# Patient Record
Sex: Female | Born: 1945 | Race: Black or African American | Hispanic: No | Smoking: Former smoker
Health system: Southern US, Community
[De-identification: ages and names within clinical notes are randomized; demographics above are authoritative.]

## PROBLEM LIST (undated history)

## (undated) DIAGNOSIS — B029 Zoster without complications: Secondary | ICD-10-CM

## (undated) DIAGNOSIS — T7840XA Allergy, unspecified, initial encounter: Secondary | ICD-10-CM

## (undated) DIAGNOSIS — H269 Unspecified cataract: Secondary | ICD-10-CM

## (undated) DIAGNOSIS — E1100 Type 2 diabetes mellitus with hyperosmolarity without nonketotic hyperglycemic-hyperosmolar coma (NKHHC): Secondary | ICD-10-CM

## (undated) DIAGNOSIS — R002 Palpitations: Secondary | ICD-10-CM

## (undated) DIAGNOSIS — E1140 Type 2 diabetes mellitus with diabetic neuropathy, unspecified: Secondary | ICD-10-CM

## (undated) DIAGNOSIS — G459 Transient cerebral ischemic attack, unspecified: Secondary | ICD-10-CM

## (undated) DIAGNOSIS — D126 Benign neoplasm of colon, unspecified: Secondary | ICD-10-CM

## (undated) DIAGNOSIS — C50919 Malignant neoplasm of unspecified site of unspecified female breast: Secondary | ICD-10-CM

## (undated) DIAGNOSIS — R0781 Pleurodynia: Secondary | ICD-10-CM

## (undated) DIAGNOSIS — E119 Type 2 diabetes mellitus without complications: Secondary | ICD-10-CM

## (undated) DIAGNOSIS — M1990 Unspecified osteoarthritis, unspecified site: Secondary | ICD-10-CM

## (undated) DIAGNOSIS — E7800 Pure hypercholesterolemia, unspecified: Secondary | ICD-10-CM

## (undated) DIAGNOSIS — N289 Disorder of kidney and ureter, unspecified: Secondary | ICD-10-CM

## (undated) DIAGNOSIS — R21 Rash and other nonspecific skin eruption: Secondary | ICD-10-CM

## (undated) DIAGNOSIS — I10 Essential (primary) hypertension: Secondary | ICD-10-CM

## (undated) DIAGNOSIS — G4733 Obstructive sleep apnea (adult) (pediatric): Secondary | ICD-10-CM

## (undated) DIAGNOSIS — E1143 Type 2 diabetes mellitus with diabetic autonomic (poly)neuropathy: Secondary | ICD-10-CM

## (undated) DIAGNOSIS — D3A00 Benign carcinoid tumor of unspecified site: Secondary | ICD-10-CM

## (undated) DIAGNOSIS — D649 Anemia, unspecified: Secondary | ICD-10-CM

## (undated) DIAGNOSIS — M79643 Pain in unspecified hand: Secondary | ICD-10-CM

## (undated) DIAGNOSIS — J40 Bronchitis, not specified as acute or chronic: Secondary | ICD-10-CM

## (undated) DIAGNOSIS — Z950 Presence of cardiac pacemaker: Secondary | ICD-10-CM

## (undated) DIAGNOSIS — E11359 Type 2 diabetes mellitus with proliferative diabetic retinopathy without macular edema: Secondary | ICD-10-CM

## (undated) DIAGNOSIS — J45909 Unspecified asthma, uncomplicated: Secondary | ICD-10-CM

## (undated) DIAGNOSIS — R0609 Other forms of dyspnea: Secondary | ICD-10-CM

## (undated) DIAGNOSIS — H4310 Vitreous hemorrhage, unspecified eye: Secondary | ICD-10-CM

## (undated) DIAGNOSIS — R0600 Dyspnea, unspecified: Secondary | ICD-10-CM

## (undated) DIAGNOSIS — I442 Atrioventricular block, complete: Secondary | ICD-10-CM

## (undated) DIAGNOSIS — I509 Heart failure, unspecified: Secondary | ICD-10-CM

## (undated) DIAGNOSIS — J449 Chronic obstructive pulmonary disease, unspecified: Secondary | ICD-10-CM

## (undated) HISTORY — DX: Chronic obstructive pulmonary disease, unspecified: J44.9

## (undated) HISTORY — DX: Transient cerebral ischemic attack, unspecified: G45.9

## (undated) HISTORY — PX: MASTECTOMY PARTIAL / LUMPECTOMY: SUR851

## (undated) HISTORY — PX: PACEMAKER INSERTION: SHX728

## (undated) HISTORY — DX: Type 2 diabetes mellitus with proliferative diabetic retinopathy without macular edema: E11.359

## (undated) HISTORY — DX: Pain in unspecified hand: M79.643

## (undated) HISTORY — DX: Heart failure, unspecified: I50.9

## (undated) HISTORY — DX: Atrioventricular block, complete: I44.2

## (undated) HISTORY — DX: Benign carcinoid tumor of unspecified site: D3A.00

## (undated) HISTORY — DX: Pleurodynia: R07.81

## (undated) HISTORY — DX: Anemia, unspecified: D64.9

## (undated) HISTORY — DX: Vitreous hemorrhage, unspecified eye: H43.10

## (undated) HISTORY — DX: Malignant neoplasm of unspecified site of unspecified female breast: C50.919

## (undated) HISTORY — DX: Other forms of dyspnea: R06.09

## (undated) HISTORY — DX: Type 2 diabetes mellitus with diabetic autonomic (poly)neuropathy: E11.43

## (undated) HISTORY — DX: Zoster without complications: B02.9

## (undated) HISTORY — DX: Benign neoplasm of colon, unspecified: D12.6

## (undated) HISTORY — DX: Type 2 diabetes mellitus without complications: E11.9

## (undated) HISTORY — DX: Palpitations: R00.2

## (undated) HISTORY — PX: MASTECTOMY PARTIAL / LUMPECTOMY W/ AXILLARY LYMPHADENECTOMY: SUR852

## (undated) HISTORY — DX: Dyspnea, unspecified: R06.00

## (undated) HISTORY — DX: Essential (primary) hypertension: I10

## (undated) HISTORY — DX: Unspecified osteoarthritis, unspecified site: M19.90

## (undated) HISTORY — DX: Rash and other nonspecific skin eruption: R21

## (undated) HISTORY — PX: LOBECTOMY: SHX5089

## (undated) HISTORY — DX: Obstructive sleep apnea (adult) (pediatric): G47.33

## (undated) HISTORY — PX: CHOLECYSTECTOMY: SHX55

## (undated) HISTORY — PX: OTHER SURGICAL HISTORY: SHX169

## (undated) HISTORY — DX: Pure hypercholesterolemia, unspecified: E78.00

## (undated) HISTORY — DX: Presence of cardiac pacemaker: Z95.0

## (undated) HISTORY — PX: TONSILLECTOMY: SUR1361

---

## 1997-08-06 ENCOUNTER — Encounter: Admission: RE | Admit: 1997-08-06 | Discharge: 1997-08-06 | Payer: Self-pay | Admitting: Internal Medicine

## 1997-10-04 ENCOUNTER — Encounter: Admission: RE | Admit: 1997-10-04 | Discharge: 1997-10-04 | Payer: Self-pay | Admitting: Hematology and Oncology

## 1998-01-01 ENCOUNTER — Encounter: Admission: RE | Admit: 1998-01-01 | Discharge: 1998-01-01 | Payer: Self-pay | Admitting: Hematology and Oncology

## 1998-01-17 ENCOUNTER — Encounter: Admission: RE | Admit: 1998-01-17 | Discharge: 1998-01-17 | Payer: Self-pay | Admitting: Internal Medicine

## 1998-01-20 ENCOUNTER — Encounter: Admission: RE | Admit: 1998-01-20 | Discharge: 1998-01-20 | Payer: Self-pay | Admitting: Internal Medicine

## 1998-03-19 ENCOUNTER — Encounter: Admission: RE | Admit: 1998-03-19 | Discharge: 1998-03-19 | Payer: Self-pay | Admitting: Internal Medicine

## 1998-05-16 ENCOUNTER — Encounter: Admission: RE | Admit: 1998-05-16 | Discharge: 1998-05-16 | Payer: Self-pay | Admitting: Internal Medicine

## 1998-05-16 ENCOUNTER — Ambulatory Visit (HOSPITAL_COMMUNITY): Admission: RE | Admit: 1998-05-16 | Discharge: 1998-05-16 | Payer: Self-pay | Admitting: Internal Medicine

## 1998-05-21 ENCOUNTER — Encounter: Admission: RE | Admit: 1998-05-21 | Discharge: 1998-05-21 | Payer: Self-pay | Admitting: Internal Medicine

## 1998-05-29 ENCOUNTER — Ambulatory Visit (HOSPITAL_COMMUNITY): Admission: RE | Admit: 1998-05-29 | Discharge: 1998-05-29 | Payer: Self-pay | Admitting: *Deleted

## 1998-06-04 ENCOUNTER — Encounter: Admission: RE | Admit: 1998-06-04 | Discharge: 1998-06-04 | Payer: Self-pay | Admitting: Hematology and Oncology

## 1998-08-18 ENCOUNTER — Ambulatory Visit (HOSPITAL_COMMUNITY): Admission: RE | Admit: 1998-08-18 | Discharge: 1998-08-18 | Payer: Self-pay | Admitting: Internal Medicine

## 1998-08-18 ENCOUNTER — Encounter: Admission: RE | Admit: 1998-08-18 | Discharge: 1998-08-18 | Payer: Self-pay | Admitting: Psychiatry

## 1998-08-19 ENCOUNTER — Encounter: Admission: RE | Admit: 1998-08-19 | Discharge: 1998-08-19 | Payer: Self-pay | Admitting: Internal Medicine

## 1998-09-15 ENCOUNTER — Encounter: Admission: RE | Admit: 1998-09-15 | Discharge: 1998-09-15 | Payer: Self-pay | Admitting: Internal Medicine

## 1998-11-24 ENCOUNTER — Encounter: Admission: RE | Admit: 1998-11-24 | Discharge: 1998-11-24 | Payer: Self-pay | Admitting: Internal Medicine

## 1998-11-25 ENCOUNTER — Encounter: Admission: RE | Admit: 1998-11-25 | Discharge: 1998-11-25 | Payer: Self-pay | Admitting: Hematology and Oncology

## 1998-12-09 ENCOUNTER — Encounter: Admission: RE | Admit: 1998-12-09 | Discharge: 1998-12-09 | Payer: Self-pay | Admitting: Hematology and Oncology

## 1998-12-16 ENCOUNTER — Encounter: Admission: RE | Admit: 1998-12-16 | Discharge: 1998-12-16 | Payer: Self-pay | Admitting: Hematology and Oncology

## 1998-12-30 ENCOUNTER — Encounter: Admission: RE | Admit: 1998-12-30 | Discharge: 1998-12-30 | Payer: Self-pay | Admitting: Hematology and Oncology

## 1999-02-04 ENCOUNTER — Encounter: Admission: RE | Admit: 1999-02-04 | Discharge: 1999-02-04 | Payer: Self-pay | Admitting: Internal Medicine

## 1999-03-25 ENCOUNTER — Encounter: Admission: RE | Admit: 1999-03-25 | Discharge: 1999-03-25 | Payer: Self-pay | Admitting: Internal Medicine

## 1999-05-27 ENCOUNTER — Encounter: Admission: RE | Admit: 1999-05-27 | Discharge: 1999-05-27 | Payer: Self-pay | Admitting: Internal Medicine

## 1999-06-11 ENCOUNTER — Encounter: Payer: Self-pay | Admitting: Gastroenterology

## 1999-06-11 ENCOUNTER — Encounter: Admission: RE | Admit: 1999-06-11 | Discharge: 1999-06-11 | Payer: Self-pay | Admitting: Gastroenterology

## 1999-07-02 ENCOUNTER — Encounter: Admission: RE | Admit: 1999-07-02 | Discharge: 1999-07-02 | Payer: Self-pay | Admitting: Internal Medicine

## 1999-07-02 ENCOUNTER — Ambulatory Visit (HOSPITAL_COMMUNITY): Admission: RE | Admit: 1999-07-02 | Discharge: 1999-07-02 | Payer: Self-pay | Admitting: Internal Medicine

## 1999-07-02 ENCOUNTER — Encounter: Payer: Self-pay | Admitting: Internal Medicine

## 1999-07-06 ENCOUNTER — Encounter (INDEPENDENT_AMBULATORY_CARE_PROVIDER_SITE_OTHER): Payer: Self-pay

## 1999-07-06 ENCOUNTER — Ambulatory Visit (HOSPITAL_COMMUNITY): Admission: RE | Admit: 1999-07-06 | Discharge: 1999-07-06 | Payer: Self-pay | Admitting: Gastroenterology

## 1999-07-06 DIAGNOSIS — Z860100 Personal history of colon polyps, unspecified: Secondary | ICD-10-CM | POA: Insufficient documentation

## 1999-08-28 ENCOUNTER — Encounter: Admission: RE | Admit: 1999-08-28 | Discharge: 1999-08-28 | Payer: Self-pay | Admitting: Internal Medicine

## 1999-09-03 ENCOUNTER — Encounter: Admission: RE | Admit: 1999-09-03 | Discharge: 1999-09-03 | Payer: Self-pay | Admitting: Hematology and Oncology

## 1999-10-13 ENCOUNTER — Other Ambulatory Visit: Admission: RE | Admit: 1999-10-13 | Discharge: 1999-10-13 | Payer: Self-pay | Admitting: Obstetrics

## 1999-10-13 ENCOUNTER — Encounter: Admission: RE | Admit: 1999-10-13 | Discharge: 1999-10-13 | Payer: Self-pay | Admitting: Obstetrics

## 1999-10-29 ENCOUNTER — Encounter: Admission: RE | Admit: 1999-10-29 | Discharge: 1999-10-29 | Payer: Self-pay | Admitting: Obstetrics

## 1999-11-25 ENCOUNTER — Encounter: Admission: RE | Admit: 1999-11-25 | Discharge: 1999-11-25 | Payer: Self-pay | Admitting: Internal Medicine

## 2000-01-06 ENCOUNTER — Encounter: Admission: RE | Admit: 2000-01-06 | Discharge: 2000-01-06 | Payer: Self-pay | Admitting: Internal Medicine

## 2000-02-10 ENCOUNTER — Encounter: Payer: Self-pay | Admitting: Oncology

## 2000-02-10 ENCOUNTER — Encounter: Admission: RE | Admit: 2000-02-10 | Discharge: 2000-02-10 | Payer: Self-pay | Admitting: Oncology

## 2000-02-11 ENCOUNTER — Encounter (HOSPITAL_COMMUNITY): Admission: RE | Admit: 2000-02-11 | Discharge: 2000-05-11 | Payer: Self-pay | Admitting: Dentistry

## 2000-02-11 ENCOUNTER — Encounter (HOSPITAL_COMMUNITY): Payer: Self-pay | Admitting: Dentistry

## 2000-02-23 ENCOUNTER — Ambulatory Visit (HOSPITAL_COMMUNITY): Admission: RE | Admit: 2000-02-23 | Discharge: 2000-02-23 | Payer: Self-pay | Admitting: Internal Medicine

## 2000-02-23 ENCOUNTER — Encounter: Admission: RE | Admit: 2000-02-23 | Discharge: 2000-02-23 | Payer: Self-pay | Admitting: Internal Medicine

## 2000-02-23 ENCOUNTER — Encounter: Payer: Self-pay | Admitting: Internal Medicine

## 2000-03-01 ENCOUNTER — Encounter: Payer: Self-pay | Admitting: Gastroenterology

## 2000-03-01 ENCOUNTER — Encounter: Admission: RE | Admit: 2000-03-01 | Discharge: 2000-03-01 | Payer: Self-pay | Admitting: Gastroenterology

## 2000-03-08 ENCOUNTER — Encounter: Admission: RE | Admit: 2000-03-08 | Discharge: 2000-03-08 | Payer: Self-pay | Admitting: Internal Medicine

## 2000-03-15 ENCOUNTER — Encounter: Admission: RE | Admit: 2000-03-15 | Discharge: 2000-03-15 | Payer: Self-pay | Admitting: Hematology and Oncology

## 2000-03-28 ENCOUNTER — Encounter: Payer: Self-pay | Admitting: Internal Medicine

## 2000-03-28 ENCOUNTER — Ambulatory Visit (HOSPITAL_COMMUNITY): Admission: RE | Admit: 2000-03-28 | Discharge: 2000-03-28 | Payer: Self-pay | Admitting: Internal Medicine

## 2000-04-06 ENCOUNTER — Encounter: Admission: RE | Admit: 2000-04-06 | Discharge: 2000-04-06 | Payer: Self-pay | Admitting: Internal Medicine

## 2000-05-04 ENCOUNTER — Other Ambulatory Visit: Admission: RE | Admit: 2000-05-04 | Discharge: 2000-05-04 | Payer: Self-pay | Admitting: Radiology

## 2000-05-11 ENCOUNTER — Other Ambulatory Visit: Admission: RE | Admit: 2000-05-11 | Discharge: 2000-05-11 | Payer: Self-pay | Admitting: Radiology

## 2000-05-13 ENCOUNTER — Encounter: Admission: RE | Admit: 2000-05-13 | Discharge: 2000-05-13 | Payer: Self-pay | Admitting: Internal Medicine

## 2000-05-16 ENCOUNTER — Encounter: Admission: RE | Admit: 2000-05-16 | Discharge: 2000-05-16 | Payer: Self-pay | Admitting: Internal Medicine

## 2000-05-22 ENCOUNTER — Encounter: Payer: Self-pay | Admitting: Emergency Medicine

## 2000-05-22 ENCOUNTER — Emergency Department (HOSPITAL_COMMUNITY): Admission: EM | Admit: 2000-05-22 | Discharge: 2000-05-22 | Payer: Self-pay | Admitting: Emergency Medicine

## 2000-05-24 ENCOUNTER — Encounter: Admission: RE | Admit: 2000-05-24 | Discharge: 2000-05-24 | Payer: Self-pay | Admitting: Obstetrics & Gynecology

## 2000-05-27 ENCOUNTER — Encounter: Admission: RE | Admit: 2000-05-27 | Discharge: 2000-05-27 | Payer: Self-pay | Admitting: Internal Medicine

## 2000-06-10 ENCOUNTER — Encounter: Admission: RE | Admit: 2000-06-10 | Discharge: 2000-06-10 | Payer: Self-pay | Admitting: Internal Medicine

## 2000-06-30 ENCOUNTER — Encounter: Admission: RE | Admit: 2000-06-30 | Discharge: 2000-06-30 | Payer: Self-pay | Admitting: Internal Medicine

## 2000-08-24 ENCOUNTER — Encounter: Admission: RE | Admit: 2000-08-24 | Discharge: 2000-08-24 | Payer: Self-pay | Admitting: Internal Medicine

## 2000-09-06 ENCOUNTER — Encounter: Payer: Self-pay | Admitting: Oncology

## 2000-09-06 ENCOUNTER — Ambulatory Visit (HOSPITAL_COMMUNITY): Admission: RE | Admit: 2000-09-06 | Discharge: 2000-09-06 | Payer: Self-pay | Admitting: Oncology

## 2000-09-21 ENCOUNTER — Ambulatory Visit (HOSPITAL_COMMUNITY): Admission: RE | Admit: 2000-09-21 | Discharge: 2000-09-21 | Payer: Self-pay | Admitting: Internal Medicine

## 2000-09-21 ENCOUNTER — Encounter: Admission: RE | Admit: 2000-09-21 | Discharge: 2000-09-21 | Payer: Self-pay | Admitting: Internal Medicine

## 2000-10-19 ENCOUNTER — Encounter: Admission: RE | Admit: 2000-10-19 | Discharge: 2000-10-19 | Payer: Self-pay | Admitting: Internal Medicine

## 2000-11-08 ENCOUNTER — Encounter: Admission: RE | Admit: 2000-11-08 | Discharge: 2000-11-08 | Payer: Self-pay | Admitting: Internal Medicine

## 2000-12-21 ENCOUNTER — Encounter: Payer: Self-pay | Admitting: Internal Medicine

## 2000-12-21 ENCOUNTER — Encounter: Admission: RE | Admit: 2000-12-21 | Discharge: 2000-12-21 | Payer: Self-pay | Admitting: Internal Medicine

## 2000-12-21 ENCOUNTER — Ambulatory Visit (HOSPITAL_COMMUNITY): Admission: RE | Admit: 2000-12-21 | Discharge: 2000-12-21 | Payer: Self-pay | Admitting: Internal Medicine

## 2000-12-22 ENCOUNTER — Encounter: Admission: RE | Admit: 2000-12-22 | Discharge: 2000-12-22 | Payer: Self-pay | Admitting: Internal Medicine

## 2001-02-01 ENCOUNTER — Encounter: Admission: RE | Admit: 2001-02-01 | Discharge: 2001-02-01 | Payer: Self-pay | Admitting: Internal Medicine

## 2001-03-01 ENCOUNTER — Encounter: Admission: RE | Admit: 2001-03-01 | Discharge: 2001-03-01 | Payer: Self-pay | Admitting: Internal Medicine

## 2001-03-08 ENCOUNTER — Ambulatory Visit (HOSPITAL_COMMUNITY): Admission: RE | Admit: 2001-03-08 | Discharge: 2001-03-08 | Payer: Self-pay | Admitting: Oncology

## 2001-03-08 ENCOUNTER — Encounter: Payer: Self-pay | Admitting: Oncology

## 2001-04-20 ENCOUNTER — Encounter: Admission: RE | Admit: 2001-04-20 | Discharge: 2001-04-20 | Payer: Self-pay

## 2001-04-20 ENCOUNTER — Inpatient Hospital Stay (HOSPITAL_COMMUNITY): Admission: RE | Admit: 2001-04-20 | Discharge: 2001-04-25 | Payer: Self-pay

## 2001-04-20 ENCOUNTER — Encounter: Payer: Self-pay | Admitting: Internal Medicine

## 2001-04-20 HISTORY — PX: PACEMAKER INSERTION: SHX728

## 2001-04-21 ENCOUNTER — Encounter: Payer: Self-pay | Admitting: Internal Medicine

## 2001-04-23 ENCOUNTER — Encounter: Payer: Self-pay | Admitting: Cardiology

## 2001-04-25 ENCOUNTER — Encounter: Payer: Self-pay | Admitting: Cardiology

## 2001-05-03 ENCOUNTER — Encounter: Admission: RE | Admit: 2001-05-03 | Discharge: 2001-05-03 | Payer: Self-pay | Admitting: Internal Medicine

## 2001-05-17 ENCOUNTER — Ambulatory Visit (HOSPITAL_BASED_OUTPATIENT_CLINIC_OR_DEPARTMENT_OTHER): Admission: RE | Admit: 2001-05-17 | Discharge: 2001-05-17 | Payer: Self-pay | Admitting: Internal Medicine

## 2001-05-17 ENCOUNTER — Encounter: Admission: RE | Admit: 2001-05-17 | Discharge: 2001-05-17 | Payer: Self-pay | Admitting: Internal Medicine

## 2001-05-29 ENCOUNTER — Ambulatory Visit (HOSPITAL_COMMUNITY): Admission: RE | Admit: 2001-05-29 | Discharge: 2001-05-29 | Payer: Self-pay | Admitting: Oncology

## 2001-05-29 ENCOUNTER — Encounter: Payer: Self-pay | Admitting: Oncology

## 2001-06-09 ENCOUNTER — Encounter: Payer: Self-pay | Admitting: Thoracic Surgery

## 2001-06-13 ENCOUNTER — Encounter: Payer: Self-pay | Admitting: Thoracic Surgery

## 2001-06-13 ENCOUNTER — Inpatient Hospital Stay (HOSPITAL_COMMUNITY): Admission: RE | Admit: 2001-06-13 | Discharge: 2001-06-20 | Payer: Self-pay | Admitting: Thoracic Surgery

## 2001-06-13 ENCOUNTER — Encounter (INDEPENDENT_AMBULATORY_CARE_PROVIDER_SITE_OTHER): Payer: Self-pay | Admitting: Specialist

## 2001-06-13 HISTORY — PX: LUNG LOBECTOMY: SHX167

## 2001-06-14 ENCOUNTER — Encounter: Payer: Self-pay | Admitting: Thoracic Surgery

## 2001-06-15 ENCOUNTER — Encounter: Payer: Self-pay | Admitting: Thoracic Surgery

## 2001-06-16 ENCOUNTER — Encounter: Payer: Self-pay | Admitting: Thoracic Surgery

## 2001-06-17 ENCOUNTER — Encounter: Payer: Self-pay | Admitting: Thoracic Surgery

## 2001-06-19 ENCOUNTER — Encounter: Payer: Self-pay | Admitting: Thoracic Surgery

## 2001-06-27 ENCOUNTER — Encounter: Admission: RE | Admit: 2001-06-27 | Discharge: 2001-06-27 | Payer: Self-pay | Admitting: Thoracic Surgery

## 2001-06-27 ENCOUNTER — Encounter: Payer: Self-pay | Admitting: Thoracic Surgery

## 2001-06-30 ENCOUNTER — Encounter: Admission: RE | Admit: 2001-06-30 | Discharge: 2001-06-30 | Payer: Self-pay | Admitting: Internal Medicine

## 2001-06-30 ENCOUNTER — Inpatient Hospital Stay (HOSPITAL_COMMUNITY): Admission: AD | Admit: 2001-06-30 | Discharge: 2001-07-03 | Payer: Self-pay | Admitting: Internal Medicine

## 2001-07-01 ENCOUNTER — Encounter: Payer: Self-pay | Admitting: Internal Medicine

## 2001-07-10 ENCOUNTER — Encounter: Admission: RE | Admit: 2001-07-10 | Discharge: 2001-07-10 | Payer: Self-pay | Admitting: Internal Medicine

## 2001-07-18 ENCOUNTER — Encounter: Admission: RE | Admit: 2001-07-18 | Discharge: 2001-07-18 | Payer: Self-pay | Admitting: Thoracic Surgery

## 2001-07-18 ENCOUNTER — Encounter: Payer: Self-pay | Admitting: Thoracic Surgery

## 2001-07-25 ENCOUNTER — Encounter: Admission: RE | Admit: 2001-07-25 | Discharge: 2001-07-25 | Payer: Self-pay | Admitting: Internal Medicine

## 2001-08-08 ENCOUNTER — Encounter: Payer: Self-pay | Admitting: Thoracic Surgery

## 2001-08-08 ENCOUNTER — Encounter: Admission: RE | Admit: 2001-08-08 | Discharge: 2001-08-08 | Payer: Self-pay | Admitting: Thoracic Surgery

## 2001-08-23 ENCOUNTER — Encounter: Admission: RE | Admit: 2001-08-23 | Discharge: 2001-08-23 | Payer: Self-pay | Admitting: Internal Medicine

## 2001-08-23 ENCOUNTER — Inpatient Hospital Stay (HOSPITAL_COMMUNITY): Admission: RE | Admit: 2001-08-23 | Discharge: 2001-08-25 | Payer: Self-pay | Admitting: Internal Medicine

## 2001-08-23 ENCOUNTER — Encounter: Payer: Self-pay | Admitting: Internal Medicine

## 2001-08-23 ENCOUNTER — Ambulatory Visit (HOSPITAL_COMMUNITY): Admission: RE | Admit: 2001-08-23 | Discharge: 2001-08-23 | Payer: Self-pay | Admitting: Internal Medicine

## 2001-08-24 ENCOUNTER — Encounter: Payer: Self-pay | Admitting: Internal Medicine

## 2001-08-28 ENCOUNTER — Inpatient Hospital Stay (HOSPITAL_COMMUNITY): Admission: EM | Admit: 2001-08-28 | Discharge: 2001-08-30 | Payer: Self-pay | Admitting: *Deleted

## 2001-08-28 ENCOUNTER — Encounter: Payer: Self-pay | Admitting: *Deleted

## 2001-08-29 ENCOUNTER — Encounter: Payer: Self-pay | Admitting: Internal Medicine

## 2001-09-13 ENCOUNTER — Encounter: Admission: RE | Admit: 2001-09-13 | Discharge: 2001-09-13 | Payer: Self-pay | Admitting: Internal Medicine

## 2001-09-19 ENCOUNTER — Ambulatory Visit (HOSPITAL_COMMUNITY): Admission: RE | Admit: 2001-09-19 | Discharge: 2001-09-19 | Payer: Self-pay | Admitting: Oncology

## 2001-09-19 ENCOUNTER — Encounter: Payer: Self-pay | Admitting: Oncology

## 2001-10-10 ENCOUNTER — Encounter: Admission: RE | Admit: 2001-10-10 | Discharge: 2001-10-10 | Payer: Self-pay | Admitting: Thoracic Surgery

## 2001-10-10 ENCOUNTER — Encounter: Payer: Self-pay | Admitting: Thoracic Surgery

## 2001-10-13 ENCOUNTER — Other Ambulatory Visit: Admission: RE | Admit: 2001-10-13 | Discharge: 2001-10-13 | Payer: Self-pay | Admitting: *Deleted

## 2001-10-13 ENCOUNTER — Encounter: Payer: Self-pay | Admitting: Internal Medicine

## 2001-10-13 ENCOUNTER — Encounter: Admission: RE | Admit: 2001-10-13 | Discharge: 2001-10-13 | Payer: Self-pay | Admitting: *Deleted

## 2001-10-13 LAB — CONVERTED CEMR LAB: Pap Smear: NORMAL

## 2001-10-24 ENCOUNTER — Encounter: Admission: RE | Admit: 2001-10-24 | Discharge: 2001-10-24 | Payer: Self-pay | Admitting: Internal Medicine

## 2001-11-15 ENCOUNTER — Encounter: Admission: RE | Admit: 2001-11-15 | Discharge: 2001-11-15 | Payer: Self-pay | Admitting: Internal Medicine

## 2001-11-27 ENCOUNTER — Ambulatory Visit (HOSPITAL_COMMUNITY): Admission: RE | Admit: 2001-11-27 | Discharge: 2001-11-27 | Payer: Self-pay | Admitting: Internal Medicine

## 2001-11-27 ENCOUNTER — Encounter: Payer: Self-pay | Admitting: Internal Medicine

## 2001-11-30 ENCOUNTER — Ambulatory Visit (HOSPITAL_COMMUNITY): Admission: RE | Admit: 2001-11-30 | Discharge: 2001-11-30 | Payer: Self-pay | Admitting: Internal Medicine

## 2001-12-06 ENCOUNTER — Ambulatory Visit (HOSPITAL_COMMUNITY): Admission: RE | Admit: 2001-12-06 | Discharge: 2001-12-06 | Payer: Self-pay | Admitting: Internal Medicine

## 2001-12-19 ENCOUNTER — Ambulatory Visit (HOSPITAL_COMMUNITY): Admission: RE | Admit: 2001-12-19 | Discharge: 2001-12-19 | Payer: Self-pay | Admitting: General Surgery

## 2001-12-19 ENCOUNTER — Encounter (HOSPITAL_BASED_OUTPATIENT_CLINIC_OR_DEPARTMENT_OTHER): Payer: Self-pay | Admitting: General Surgery

## 2002-01-24 ENCOUNTER — Encounter: Payer: Self-pay | Admitting: Emergency Medicine

## 2002-01-24 ENCOUNTER — Inpatient Hospital Stay (HOSPITAL_COMMUNITY): Admission: EM | Admit: 2002-01-24 | Discharge: 2002-02-02 | Payer: Self-pay | Admitting: Emergency Medicine

## 2002-01-24 ENCOUNTER — Encounter: Payer: Self-pay | Admitting: Internal Medicine

## 2002-01-25 ENCOUNTER — Encounter: Payer: Self-pay | Admitting: Internal Medicine

## 2002-01-26 ENCOUNTER — Encounter: Payer: Self-pay | Admitting: Internal Medicine

## 2002-01-29 ENCOUNTER — Encounter: Payer: Self-pay | Admitting: Internal Medicine

## 2002-01-31 ENCOUNTER — Encounter: Payer: Self-pay | Admitting: Internal Medicine

## 2002-02-22 ENCOUNTER — Encounter: Admission: RE | Admit: 2002-02-22 | Discharge: 2002-02-22 | Payer: Self-pay | Admitting: Thoracic Surgery

## 2002-02-22 ENCOUNTER — Encounter: Payer: Self-pay | Admitting: Thoracic Surgery

## 2002-02-28 ENCOUNTER — Encounter: Admission: RE | Admit: 2002-02-28 | Discharge: 2002-02-28 | Payer: Self-pay | Admitting: Internal Medicine

## 2002-03-07 ENCOUNTER — Encounter: Admission: RE | Admit: 2002-03-07 | Discharge: 2002-03-07 | Payer: Self-pay | Admitting: Internal Medicine

## 2002-03-28 ENCOUNTER — Encounter: Payer: Self-pay | Admitting: Internal Medicine

## 2002-03-28 ENCOUNTER — Ambulatory Visit (HOSPITAL_COMMUNITY): Admission: RE | Admit: 2002-03-28 | Discharge: 2002-03-28 | Payer: Self-pay | Admitting: Internal Medicine

## 2002-04-04 ENCOUNTER — Ambulatory Visit (HOSPITAL_COMMUNITY): Admission: RE | Admit: 2002-04-04 | Discharge: 2002-04-04 | Payer: Self-pay | Admitting: Internal Medicine

## 2002-04-04 ENCOUNTER — Encounter: Admission: RE | Admit: 2002-04-04 | Discharge: 2002-04-04 | Payer: Self-pay | Admitting: Internal Medicine

## 2002-04-04 ENCOUNTER — Encounter: Payer: Self-pay | Admitting: Internal Medicine

## 2002-04-23 ENCOUNTER — Encounter: Payer: Self-pay | Admitting: Internal Medicine

## 2002-04-23 ENCOUNTER — Ambulatory Visit (HOSPITAL_COMMUNITY): Admission: RE | Admit: 2002-04-23 | Discharge: 2002-04-23 | Payer: Self-pay | Admitting: Internal Medicine

## 2002-04-24 ENCOUNTER — Encounter: Payer: Self-pay | Admitting: Thoracic Surgery

## 2002-04-24 ENCOUNTER — Encounter: Admission: RE | Admit: 2002-04-24 | Discharge: 2002-04-24 | Payer: Self-pay | Admitting: Thoracic Surgery

## 2002-05-08 ENCOUNTER — Encounter: Payer: Self-pay | Admitting: Internal Medicine

## 2002-05-08 ENCOUNTER — Ambulatory Visit (HOSPITAL_COMMUNITY): Admission: RE | Admit: 2002-05-08 | Discharge: 2002-05-08 | Payer: Self-pay | Admitting: Internal Medicine

## 2002-05-09 ENCOUNTER — Encounter: Admission: RE | Admit: 2002-05-09 | Discharge: 2002-05-09 | Payer: Self-pay | Admitting: Internal Medicine

## 2002-05-22 ENCOUNTER — Encounter: Payer: Self-pay | Admitting: Internal Medicine

## 2002-05-22 ENCOUNTER — Ambulatory Visit (HOSPITAL_COMMUNITY): Admission: RE | Admit: 2002-05-22 | Discharge: 2002-05-22 | Payer: Self-pay | Admitting: Internal Medicine

## 2002-05-30 ENCOUNTER — Encounter: Admission: RE | Admit: 2002-05-30 | Discharge: 2002-05-30 | Payer: Self-pay | Admitting: Internal Medicine

## 2002-07-04 ENCOUNTER — Encounter: Admission: RE | Admit: 2002-07-04 | Discharge: 2002-07-04 | Payer: Self-pay | Admitting: Internal Medicine

## 2002-07-18 ENCOUNTER — Encounter: Admission: RE | Admit: 2002-07-18 | Discharge: 2002-07-18 | Payer: Self-pay | Admitting: Internal Medicine

## 2002-07-25 ENCOUNTER — Encounter: Admission: RE | Admit: 2002-07-25 | Discharge: 2002-07-25 | Payer: Self-pay | Admitting: Internal Medicine

## 2002-08-20 ENCOUNTER — Encounter: Payer: Self-pay | Admitting: Internal Medicine

## 2002-08-28 ENCOUNTER — Encounter: Admission: RE | Admit: 2002-08-28 | Discharge: 2002-08-28 | Payer: Self-pay | Admitting: Thoracic Surgery

## 2002-08-28 ENCOUNTER — Encounter: Payer: Self-pay | Admitting: Thoracic Surgery

## 2002-09-05 ENCOUNTER — Encounter: Admission: RE | Admit: 2002-09-05 | Discharge: 2002-09-05 | Payer: Self-pay | Admitting: Internal Medicine

## 2002-10-24 ENCOUNTER — Encounter: Admission: RE | Admit: 2002-10-24 | Discharge: 2002-10-24 | Payer: Self-pay | Admitting: Internal Medicine

## 2002-11-02 ENCOUNTER — Ambulatory Visit (HOSPITAL_COMMUNITY): Admission: RE | Admit: 2002-11-02 | Discharge: 2002-11-02 | Payer: Self-pay | Admitting: Internal Medicine

## 2002-11-02 ENCOUNTER — Encounter: Payer: Self-pay | Admitting: Internal Medicine

## 2002-11-12 ENCOUNTER — Encounter: Admission: RE | Admit: 2002-11-12 | Discharge: 2002-11-12 | Payer: Self-pay | Admitting: Internal Medicine

## 2002-12-20 ENCOUNTER — Encounter: Admission: RE | Admit: 2002-12-20 | Discharge: 2002-12-20 | Payer: Self-pay | Admitting: Internal Medicine

## 2002-12-20 ENCOUNTER — Ambulatory Visit (HOSPITAL_COMMUNITY): Admission: RE | Admit: 2002-12-20 | Discharge: 2002-12-20 | Payer: Self-pay | Admitting: Internal Medicine

## 2002-12-20 ENCOUNTER — Encounter: Payer: Self-pay | Admitting: Internal Medicine

## 2002-12-26 ENCOUNTER — Ambulatory Visit (HOSPITAL_COMMUNITY): Admission: RE | Admit: 2002-12-26 | Discharge: 2002-12-26 | Payer: Self-pay | Admitting: Internal Medicine

## 2003-01-03 ENCOUNTER — Encounter: Admission: RE | Admit: 2003-01-03 | Discharge: 2003-01-03 | Payer: Self-pay | Admitting: Internal Medicine

## 2003-02-07 ENCOUNTER — Encounter: Admission: RE | Admit: 2003-02-07 | Discharge: 2003-02-07 | Payer: Self-pay | Admitting: Internal Medicine

## 2003-03-06 ENCOUNTER — Encounter: Admission: RE | Admit: 2003-03-06 | Discharge: 2003-03-06 | Payer: Self-pay | Admitting: Thoracic Surgery

## 2003-03-13 ENCOUNTER — Encounter: Admission: RE | Admit: 2003-03-13 | Discharge: 2003-03-13 | Payer: Self-pay | Admitting: Internal Medicine

## 2003-03-13 ENCOUNTER — Ambulatory Visit (HOSPITAL_COMMUNITY): Admission: RE | Admit: 2003-03-13 | Discharge: 2003-03-13 | Payer: Self-pay | Admitting: Internal Medicine

## 2003-05-02 ENCOUNTER — Encounter: Admission: RE | Admit: 2003-05-02 | Discharge: 2003-05-02 | Payer: Self-pay | Admitting: Internal Medicine

## 2003-06-05 ENCOUNTER — Encounter: Admission: RE | Admit: 2003-06-05 | Discharge: 2003-06-05 | Payer: Self-pay | Admitting: Internal Medicine

## 2003-07-31 ENCOUNTER — Encounter: Admission: RE | Admit: 2003-07-31 | Discharge: 2003-07-31 | Payer: Self-pay | Admitting: Internal Medicine

## 2003-07-31 ENCOUNTER — Ambulatory Visit (HOSPITAL_COMMUNITY): Admission: RE | Admit: 2003-07-31 | Discharge: 2003-07-31 | Payer: Self-pay | Admitting: Internal Medicine

## 2003-08-15 ENCOUNTER — Ambulatory Visit (HOSPITAL_COMMUNITY): Admission: RE | Admit: 2003-08-15 | Discharge: 2003-08-15 | Payer: Self-pay | Admitting: Internal Medicine

## 2003-09-03 ENCOUNTER — Encounter: Admission: RE | Admit: 2003-09-03 | Discharge: 2003-09-03 | Payer: Self-pay | Admitting: Thoracic Surgery

## 2003-09-04 ENCOUNTER — Encounter: Admission: RE | Admit: 2003-09-04 | Discharge: 2003-09-04 | Payer: Self-pay | Admitting: Internal Medicine

## 2003-09-09 ENCOUNTER — Ambulatory Visit (HOSPITAL_COMMUNITY): Admission: RE | Admit: 2003-09-09 | Discharge: 2003-09-09 | Payer: Self-pay | Admitting: Internal Medicine

## 2003-11-06 ENCOUNTER — Encounter: Admission: RE | Admit: 2003-11-06 | Discharge: 2003-11-06 | Payer: Self-pay | Admitting: Internal Medicine

## 2003-12-04 ENCOUNTER — Ambulatory Visit (HOSPITAL_COMMUNITY): Admission: RE | Admit: 2003-12-04 | Discharge: 2003-12-04 | Payer: Self-pay | Admitting: Oncology

## 2004-01-08 ENCOUNTER — Ambulatory Visit: Payer: Self-pay | Admitting: Internal Medicine

## 2004-02-04 ENCOUNTER — Encounter: Admission: RE | Admit: 2004-02-04 | Discharge: 2004-02-04 | Payer: Self-pay | Admitting: Thoracic Surgery

## 2004-02-13 ENCOUNTER — Ambulatory Visit (HOSPITAL_COMMUNITY): Admission: RE | Admit: 2004-02-13 | Discharge: 2004-02-13 | Payer: Self-pay | Admitting: Gastroenterology

## 2004-03-04 ENCOUNTER — Ambulatory Visit: Payer: Self-pay | Admitting: Internal Medicine

## 2004-05-18 ENCOUNTER — Emergency Department (HOSPITAL_COMMUNITY): Admission: EM | Admit: 2004-05-18 | Discharge: 2004-05-18 | Payer: Self-pay | Admitting: *Deleted

## 2004-05-27 ENCOUNTER — Ambulatory Visit: Payer: Self-pay | Admitting: Internal Medicine

## 2004-05-27 ENCOUNTER — Ambulatory Visit (HOSPITAL_COMMUNITY): Admission: RE | Admit: 2004-05-27 | Discharge: 2004-05-27 | Payer: Self-pay | Admitting: Internal Medicine

## 2004-06-09 ENCOUNTER — Encounter (HOSPITAL_COMMUNITY): Admission: RE | Admit: 2004-06-09 | Discharge: 2004-09-07 | Payer: Self-pay | Admitting: Cardiology

## 2004-06-10 ENCOUNTER — Ambulatory Visit: Payer: Self-pay | Admitting: Internal Medicine

## 2004-06-25 ENCOUNTER — Ambulatory Visit (HOSPITAL_COMMUNITY): Admission: RE | Admit: 2004-06-25 | Discharge: 2004-06-25 | Payer: Self-pay | Admitting: Internal Medicine

## 2004-06-25 ENCOUNTER — Encounter (INDEPENDENT_AMBULATORY_CARE_PROVIDER_SITE_OTHER): Payer: Self-pay | Admitting: *Deleted

## 2004-09-30 ENCOUNTER — Ambulatory Visit: Payer: Self-pay | Admitting: Internal Medicine

## 2004-10-28 ENCOUNTER — Ambulatory Visit: Payer: Self-pay | Admitting: Internal Medicine

## 2004-11-02 ENCOUNTER — Ambulatory Visit (HOSPITAL_COMMUNITY): Admission: RE | Admit: 2004-11-02 | Discharge: 2004-11-02 | Payer: Self-pay | Admitting: Internal Medicine

## 2004-11-17 ENCOUNTER — Ambulatory Visit: Payer: Self-pay | Admitting: Internal Medicine

## 2004-11-23 ENCOUNTER — Ambulatory Visit: Payer: Self-pay | Admitting: Oncology

## 2004-12-30 ENCOUNTER — Ambulatory Visit: Payer: Self-pay | Admitting: Internal Medicine

## 2005-01-13 ENCOUNTER — Ambulatory Visit: Payer: Self-pay | Admitting: Internal Medicine

## 2005-02-01 ENCOUNTER — Ambulatory Visit: Payer: Self-pay | Admitting: Internal Medicine

## 2005-03-17 ENCOUNTER — Ambulatory Visit: Payer: Self-pay | Admitting: Internal Medicine

## 2005-03-31 ENCOUNTER — Ambulatory Visit: Payer: Self-pay | Admitting: Internal Medicine

## 2005-04-06 ENCOUNTER — Ambulatory Visit (HOSPITAL_COMMUNITY): Admission: RE | Admit: 2005-04-06 | Discharge: 2005-04-07 | Payer: Self-pay | Admitting: Ophthalmology

## 2005-05-20 ENCOUNTER — Ambulatory Visit (HOSPITAL_COMMUNITY): Admission: RE | Admit: 2005-05-20 | Discharge: 2005-05-20 | Payer: Self-pay | Admitting: Oncology

## 2005-05-27 ENCOUNTER — Ambulatory Visit: Payer: Self-pay | Admitting: Internal Medicine

## 2005-06-09 ENCOUNTER — Encounter (HOSPITAL_COMMUNITY): Admission: RE | Admit: 2005-06-09 | Discharge: 2005-09-07 | Payer: Self-pay | Admitting: Cardiology

## 2005-06-16 ENCOUNTER — Ambulatory Visit: Payer: Self-pay | Admitting: Internal Medicine

## 2005-06-17 ENCOUNTER — Ambulatory Visit (HOSPITAL_COMMUNITY): Admission: RE | Admit: 2005-06-17 | Discharge: 2005-06-17 | Payer: Self-pay | Admitting: Cardiology

## 2005-07-27 ENCOUNTER — Ambulatory Visit (HOSPITAL_BASED_OUTPATIENT_CLINIC_OR_DEPARTMENT_OTHER): Admission: RE | Admit: 2005-07-27 | Discharge: 2005-07-27 | Payer: Self-pay | Admitting: Internal Medicine

## 2005-08-01 ENCOUNTER — Ambulatory Visit: Payer: Self-pay | Admitting: Internal Medicine

## 2005-08-29 ENCOUNTER — Ambulatory Visit (HOSPITAL_BASED_OUTPATIENT_CLINIC_OR_DEPARTMENT_OTHER): Admission: RE | Admit: 2005-08-29 | Discharge: 2005-08-29 | Payer: Self-pay | Admitting: Internal Medicine

## 2005-08-29 ENCOUNTER — Encounter: Payer: Self-pay | Admitting: Internal Medicine

## 2005-09-05 ENCOUNTER — Ambulatory Visit: Payer: Self-pay | Admitting: Internal Medicine

## 2005-09-14 ENCOUNTER — Ambulatory Visit: Payer: Self-pay | Admitting: Internal Medicine

## 2005-10-27 ENCOUNTER — Ambulatory Visit: Payer: Self-pay | Admitting: Internal Medicine

## 2005-11-19 ENCOUNTER — Ambulatory Visit: Payer: Self-pay | Admitting: Oncology

## 2005-11-23 ENCOUNTER — Ambulatory Visit (HOSPITAL_COMMUNITY): Admission: RE | Admit: 2005-11-23 | Discharge: 2005-11-23 | Payer: Self-pay | Admitting: Oncology

## 2005-12-01 ENCOUNTER — Ambulatory Visit: Payer: Self-pay | Admitting: Internal Medicine

## 2005-12-02 ENCOUNTER — Ambulatory Visit (HOSPITAL_COMMUNITY): Admission: RE | Admit: 2005-12-02 | Discharge: 2005-12-02 | Payer: Self-pay | Admitting: Internal Medicine

## 2005-12-08 ENCOUNTER — Ambulatory Visit: Payer: Self-pay | Admitting: Internal Medicine

## 2005-12-08 LAB — CONVERTED CEMR LAB
HDL: 32 mg/dL
Total CHOL/HDL Ratio: 3.8
VLDL: 18 mg/dL

## 2005-12-10 ENCOUNTER — Encounter: Payer: Self-pay | Admitting: Internal Medicine

## 2005-12-10 ENCOUNTER — Ambulatory Visit (HOSPITAL_COMMUNITY): Admission: RE | Admit: 2005-12-10 | Discharge: 2005-12-10 | Payer: Self-pay | Admitting: Internal Medicine

## 2005-12-10 ENCOUNTER — Encounter: Payer: Self-pay | Admitting: Vascular Surgery

## 2006-01-05 ENCOUNTER — Ambulatory Visit: Payer: Self-pay | Admitting: Internal Medicine

## 2006-02-10 ENCOUNTER — Ambulatory Visit: Payer: Self-pay | Admitting: Internal Medicine

## 2006-02-10 LAB — CONVERTED CEMR LAB
BUN: 18 mg/dL (ref 6–23)
CO2: 19 meq/L (ref 19–32)
Calcium: 9.5 mg/dL (ref 8.4–10.5)
Glucose, Bld: 144 mg/dL — ABNORMAL HIGH (ref 70–99)

## 2006-02-28 ENCOUNTER — Inpatient Hospital Stay (HOSPITAL_COMMUNITY): Admission: RE | Admit: 2006-02-28 | Discharge: 2006-02-28 | Payer: Self-pay | Admitting: Thoracic Surgery

## 2006-02-28 ENCOUNTER — Encounter (INDEPENDENT_AMBULATORY_CARE_PROVIDER_SITE_OTHER): Payer: Self-pay | Admitting: Specialist

## 2006-03-07 ENCOUNTER — Encounter: Payer: Self-pay | Admitting: Internal Medicine

## 2006-03-07 DIAGNOSIS — D381 Neoplasm of uncertain behavior of trachea, bronchus and lung: Secondary | ICD-10-CM

## 2006-03-07 DIAGNOSIS — D509 Iron deficiency anemia, unspecified: Secondary | ICD-10-CM

## 2006-03-07 DIAGNOSIS — I10 Essential (primary) hypertension: Secondary | ICD-10-CM | POA: Insufficient documentation

## 2006-03-07 DIAGNOSIS — J4489 Other specified chronic obstructive pulmonary disease: Secondary | ICD-10-CM | POA: Insufficient documentation

## 2006-03-07 DIAGNOSIS — G609 Hereditary and idiopathic neuropathy, unspecified: Secondary | ICD-10-CM | POA: Insufficient documentation

## 2006-03-07 DIAGNOSIS — E119 Type 2 diabetes mellitus without complications: Secondary | ICD-10-CM | POA: Insufficient documentation

## 2006-03-07 DIAGNOSIS — R079 Chest pain, unspecified: Secondary | ICD-10-CM | POA: Insufficient documentation

## 2006-03-07 DIAGNOSIS — M79609 Pain in unspecified limb: Secondary | ICD-10-CM

## 2006-03-07 DIAGNOSIS — I509 Heart failure, unspecified: Secondary | ICD-10-CM | POA: Insufficient documentation

## 2006-03-07 DIAGNOSIS — G4733 Obstructive sleep apnea (adult) (pediatric): Secondary | ICD-10-CM

## 2006-03-07 DIAGNOSIS — M1990 Unspecified osteoarthritis, unspecified site: Secondary | ICD-10-CM | POA: Insufficient documentation

## 2006-03-07 DIAGNOSIS — J449 Chronic obstructive pulmonary disease, unspecified: Secondary | ICD-10-CM

## 2006-03-07 DIAGNOSIS — E7800 Pure hypercholesterolemia, unspecified: Secondary | ICD-10-CM | POA: Insufficient documentation

## 2006-03-30 ENCOUNTER — Ambulatory Visit (HOSPITAL_COMMUNITY): Admission: RE | Admit: 2006-03-30 | Discharge: 2006-03-30 | Payer: Self-pay | Admitting: Internal Medicine

## 2006-03-30 ENCOUNTER — Ambulatory Visit: Payer: Self-pay | Admitting: Internal Medicine

## 2006-04-27 ENCOUNTER — Encounter: Payer: Self-pay | Admitting: Internal Medicine

## 2006-04-27 ENCOUNTER — Ambulatory Visit: Payer: Self-pay | Admitting: Internal Medicine

## 2006-04-27 LAB — CONVERTED CEMR LAB
Albumin: 4.1 g/dL (ref 3.5–5.2)
Alkaline Phosphatase: 68 units/L (ref 39–117)
BUN: 18 mg/dL (ref 6–23)
CO2: 21 meq/L (ref 19–32)
Calcium: 9.6 mg/dL (ref 8.4–10.5)
Eosinophils Relative: 2 % (ref 0–5)
Ferritin: 22 ng/mL (ref 10–291)
Glucose, Bld: 76 mg/dL (ref 70–99)
HCT: 35.1 % — ABNORMAL LOW (ref 36.0–46.0)
Hemoglobin: 10.5 g/dL — ABNORMAL LOW (ref 12.0–15.0)
Lymphocytes Relative: 22 % (ref 12–46)
Lymphs Abs: 1.1 10*3/uL (ref 0.7–3.3)
Neutro Abs: 3.5 10*3/uL (ref 1.7–7.7)
Platelets: 347 10*3/uL (ref 150–400)
Potassium: 4.2 meq/L (ref 3.5–5.3)
Sodium: 142 meq/L (ref 135–145)
Total Protein: 7.7 g/dL (ref 6.0–8.3)
WBC: 5.2 10*3/uL (ref 4.0–10.5)

## 2006-04-30 DIAGNOSIS — R0989 Other specified symptoms and signs involving the circulatory and respiratory systems: Secondary | ICD-10-CM | POA: Insufficient documentation

## 2006-04-30 DIAGNOSIS — R0609 Other forms of dyspnea: Secondary | ICD-10-CM | POA: Insufficient documentation

## 2006-04-30 DIAGNOSIS — H4310 Vitreous hemorrhage, unspecified eye: Secondary | ICD-10-CM | POA: Insufficient documentation

## 2006-04-30 DIAGNOSIS — E113599 Type 2 diabetes mellitus with proliferative diabetic retinopathy without macular edema, unspecified eye: Secondary | ICD-10-CM | POA: Insufficient documentation

## 2006-04-30 DIAGNOSIS — J329 Chronic sinusitis, unspecified: Secondary | ICD-10-CM | POA: Insufficient documentation

## 2006-04-30 DIAGNOSIS — E11359 Type 2 diabetes mellitus with proliferative diabetic retinopathy without macular edema: Secondary | ICD-10-CM

## 2006-04-30 DIAGNOSIS — I442 Atrioventricular block, complete: Secondary | ICD-10-CM

## 2006-04-30 DIAGNOSIS — R32 Unspecified urinary incontinence: Secondary | ICD-10-CM

## 2006-06-06 ENCOUNTER — Telehealth (INDEPENDENT_AMBULATORY_CARE_PROVIDER_SITE_OTHER): Payer: Self-pay | Admitting: *Deleted

## 2006-07-04 ENCOUNTER — Telehealth: Payer: Self-pay | Admitting: *Deleted

## 2006-07-11 ENCOUNTER — Telehealth (INDEPENDENT_AMBULATORY_CARE_PROVIDER_SITE_OTHER): Payer: Self-pay | Admitting: *Deleted

## 2006-08-09 ENCOUNTER — Telehealth (INDEPENDENT_AMBULATORY_CARE_PROVIDER_SITE_OTHER): Payer: Self-pay | Admitting: *Deleted

## 2006-08-24 ENCOUNTER — Ambulatory Visit: Payer: Self-pay | Admitting: Internal Medicine

## 2006-08-24 LAB — CONVERTED CEMR LAB
ALT: 18 units/L (ref 0–35)
AST: 16 units/L (ref 0–37)
Basophils Absolute: 0 10*3/uL (ref 0.0–0.1)
Basophils Relative: 0 % (ref 0–1)
Blood Glucose, Fingerstick: 105
CO2: 24 meq/L (ref 19–32)
Calcium: 9.8 mg/dL (ref 8.4–10.5)
Chloride: 106 meq/L (ref 96–112)
Creatinine, Ser: 0.73 mg/dL (ref 0.40–1.20)
Ferritin: 48 ng/mL (ref 10–291)
Hgb A1c MFr Bld: 6 %
Lymphocytes Relative: 22 % (ref 12–46)
MCHC: 30.6 g/dL (ref 30.0–36.0)
Monocytes Absolute: 0.6 10*3/uL (ref 0.2–0.7)
Neutro Abs: 4.6 10*3/uL (ref 1.7–7.7)
Neutrophils Relative %: 68 % (ref 43–77)
Platelets: 278 10*3/uL (ref 150–400)
RDW: 23.2 % — ABNORMAL HIGH (ref 11.5–14.0)
Sodium: 141 meq/L (ref 135–145)
Total Protein: 7.9 g/dL (ref 6.0–8.3)

## 2006-09-20 ENCOUNTER — Telehealth (INDEPENDENT_AMBULATORY_CARE_PROVIDER_SITE_OTHER): Payer: Self-pay | Admitting: *Deleted

## 2006-10-19 ENCOUNTER — Ambulatory Visit: Payer: Self-pay | Admitting: Internal Medicine

## 2006-10-20 ENCOUNTER — Ambulatory Visit: Payer: Self-pay | Admitting: Internal Medicine

## 2006-10-20 ENCOUNTER — Inpatient Hospital Stay (HOSPITAL_COMMUNITY): Admission: RE | Admit: 2006-10-20 | Discharge: 2006-10-24 | Payer: Self-pay | Admitting: Internal Medicine

## 2006-10-20 ENCOUNTER — Encounter: Admission: RE | Admit: 2006-10-20 | Discharge: 2006-10-20 | Payer: Self-pay | Admitting: Internal Medicine

## 2006-10-20 ENCOUNTER — Encounter (INDEPENDENT_AMBULATORY_CARE_PROVIDER_SITE_OTHER): Payer: Self-pay | Admitting: Internal Medicine

## 2006-10-20 LAB — CONVERTED CEMR LAB
ALT: 35 units/L (ref 0–35)
AST: 31 units/L (ref 0–37)
Alkaline Phosphatase: 76 units/L (ref 39–117)
BUN: 17 mg/dL (ref 6–23)
Basophils Absolute: 0 10*3/uL (ref 0.0–0.1)
Basophils Relative: 0 % (ref 0–1)
Creatinine, Ser: 0.94 mg/dL (ref 0.40–1.20)
Eosinophils Absolute: 0 10*3/uL (ref 0.0–0.7)
Hemoglobin: 12.6 g/dL (ref 12.0–15.0)
MCHC: 31.5 g/dL (ref 30.0–36.0)
MCV: 84.7 fL (ref 78.0–100.0)
Monocytes Absolute: 0.8 10*3/uL — ABNORMAL HIGH (ref 0.2–0.7)
Monocytes Relative: 7 % (ref 3–11)
Neutrophils Relative %: 82 % — ABNORMAL HIGH (ref 43–77)
RBC: 4.7 M/uL (ref 3.87–5.11)
RDW: 19.3 % — ABNORMAL HIGH (ref 11.5–14.0)
Relative Index: 1.9 (ref 0.0–2.5)
Troponin I: 0.09 ng/mL — ABNORMAL HIGH (ref <0.06)

## 2006-10-22 ENCOUNTER — Encounter: Payer: Self-pay | Admitting: Internal Medicine

## 2006-11-08 ENCOUNTER — Telehealth: Payer: Self-pay | Admitting: *Deleted

## 2006-11-10 ENCOUNTER — Ambulatory Visit: Payer: Self-pay | Admitting: *Deleted

## 2006-11-11 DIAGNOSIS — K219 Gastro-esophageal reflux disease without esophagitis: Secondary | ICD-10-CM | POA: Insufficient documentation

## 2006-11-18 ENCOUNTER — Encounter: Payer: Self-pay | Admitting: Internal Medicine

## 2006-11-18 ENCOUNTER — Telehealth (INDEPENDENT_AMBULATORY_CARE_PROVIDER_SITE_OTHER): Payer: Self-pay | Admitting: *Deleted

## 2006-11-21 ENCOUNTER — Encounter: Payer: Self-pay | Admitting: Internal Medicine

## 2006-11-24 ENCOUNTER — Ambulatory Visit: Payer: Self-pay | Admitting: Infectious Disease

## 2006-11-24 ENCOUNTER — Encounter (INDEPENDENT_AMBULATORY_CARE_PROVIDER_SITE_OTHER): Payer: Self-pay | Admitting: Internal Medicine

## 2006-11-24 LAB — CONVERTED CEMR LAB
Creatinine, Urine: 126.4 mg/dL
Hgb A1c MFr Bld: 6.5 %
Microalb, Ur: 33.4 mg/dL — ABNORMAL HIGH (ref 0.00–1.89)

## 2006-11-29 ENCOUNTER — Encounter: Payer: Self-pay | Admitting: Internal Medicine

## 2006-11-30 ENCOUNTER — Encounter: Payer: Self-pay | Admitting: Internal Medicine

## 2006-12-03 ENCOUNTER — Ambulatory Visit: Payer: Self-pay | Admitting: Oncology

## 2006-12-06 ENCOUNTER — Inpatient Hospital Stay (HOSPITAL_COMMUNITY): Admission: AD | Admit: 2006-12-06 | Discharge: 2006-12-08 | Payer: Self-pay | Admitting: Internal Medicine

## 2006-12-06 ENCOUNTER — Ambulatory Visit: Payer: Self-pay | Admitting: Internal Medicine

## 2006-12-06 ENCOUNTER — Telehealth: Payer: Self-pay | Admitting: Internal Medicine

## 2006-12-06 ENCOUNTER — Encounter (INDEPENDENT_AMBULATORY_CARE_PROVIDER_SITE_OTHER): Payer: Self-pay | Admitting: *Deleted

## 2006-12-06 LAB — CONVERTED CEMR LAB
ALT: 21 units/L (ref 0–35)
AST: 18 units/L (ref 0–37)
Albumin: 3.5 g/dL (ref 3.5–5.2)
Alkaline Phosphatase: 71 units/L (ref 39–117)
BUN: 14 mg/dL (ref 6–23)
Basophils Absolute: 0 10*3/uL (ref 0.0–0.1)
Basophils Relative: 0 % (ref 0–1)
Bilirubin Urine: NEGATIVE
Eosinophils Absolute: 0.1 10*3/uL (ref 0.0–0.7)
MCHC: 31.6 g/dL (ref 30.0–36.0)
MCV: 86.3 fL (ref 78.0–100.0)
Monocytes Relative: 10 % (ref 3–11)
Neutrophils Relative %: 65 % (ref 43–77)
Platelets: 267 10*3/uL (ref 150–400)
Potassium: 3.7 meq/L (ref 3.5–5.3)
Protein, ur: 100 mg/dL — AB
RDW: 19.4 % — ABNORMAL HIGH (ref 11.5–14.0)
Specific Gravity, Urine: 1.017 (ref 1.005–1.03)
Urine Glucose: NEGATIVE mg/dL

## 2006-12-07 ENCOUNTER — Encounter: Payer: Self-pay | Admitting: Internal Medicine

## 2006-12-12 ENCOUNTER — Telehealth: Payer: Self-pay | Admitting: *Deleted

## 2006-12-20 ENCOUNTER — Encounter: Payer: Self-pay | Admitting: Internal Medicine

## 2006-12-26 ENCOUNTER — Encounter: Payer: Self-pay | Admitting: Internal Medicine

## 2007-01-04 ENCOUNTER — Encounter: Payer: Self-pay | Admitting: Internal Medicine

## 2007-01-11 ENCOUNTER — Ambulatory Visit: Payer: Self-pay | Admitting: Internal Medicine

## 2007-01-11 ENCOUNTER — Ambulatory Visit (HOSPITAL_COMMUNITY): Admission: RE | Admit: 2007-01-11 | Discharge: 2007-01-11 | Payer: Self-pay | Admitting: Internal Medicine

## 2007-01-11 LAB — CONVERTED CEMR LAB
AST: 20 units/L (ref 0–37)
Albumin: 3.4 g/dL — ABNORMAL LOW (ref 3.5–5.2)
Alkaline Phosphatase: 65 units/L (ref 39–117)
Bilirubin Urine: NEGATIVE
Ketones, ur: NEGATIVE mg/dL
Potassium: 3.7 meq/L (ref 3.5–5.3)
Protein, ur: NEGATIVE mg/dL
Sodium: 139 meq/L (ref 135–145)
Total Protein: 7.6 g/dL (ref 6.0–8.3)
Urobilinogen, UA: 1 (ref 0.0–1.0)

## 2007-01-12 ENCOUNTER — Encounter: Payer: Self-pay | Admitting: Internal Medicine

## 2007-01-16 ENCOUNTER — Telehealth (INDEPENDENT_AMBULATORY_CARE_PROVIDER_SITE_OTHER): Payer: Self-pay | Admitting: *Deleted

## 2007-01-25 ENCOUNTER — Encounter: Payer: Self-pay | Admitting: Internal Medicine

## 2007-02-03 ENCOUNTER — Encounter: Payer: Self-pay | Admitting: Internal Medicine

## 2007-02-13 ENCOUNTER — Telehealth: Payer: Self-pay | Admitting: *Deleted

## 2007-02-24 ENCOUNTER — Telehealth: Payer: Self-pay | Admitting: *Deleted

## 2007-02-24 ENCOUNTER — Ambulatory Visit: Payer: Self-pay | Admitting: Internal Medicine

## 2007-02-24 LAB — CONVERTED CEMR LAB
Blood Glucose, Fingerstick: 125
Hgb A1c MFr Bld: 6.4 %

## 2007-03-01 ENCOUNTER — Ambulatory Visit: Payer: Self-pay | Admitting: Internal Medicine

## 2007-03-02 ENCOUNTER — Ambulatory Visit: Payer: Self-pay | Admitting: Internal Medicine

## 2007-03-02 LAB — CONVERTED CEMR LAB
Basophils Absolute: 0 10*3/uL (ref 0.0–0.1)
CO2: 23 meq/L (ref 19–32)
Chloride: 102 meq/L (ref 96–112)
Creatinine, Ser: 0.79 mg/dL (ref 0.40–1.20)
HDL: 30 mg/dL — ABNORMAL LOW (ref >39)
Hemoglobin: 12.8 g/dL (ref 12.0–15.0)
LDL Cholesterol: 69 mg/dL (ref 0–99)
Lymphocytes Relative: 19 % (ref 12–46)
Monocytes Absolute: 0.6 10*3/uL (ref 0.1–1.0)
Neutro Abs: 4.6 10*3/uL (ref 1.7–7.7)
Potassium: 3.7 meq/L (ref 3.5–5.3)
RBC: 4.73 M/uL (ref 3.87–5.11)
RDW: 18.6 % — ABNORMAL HIGH (ref 11.5–15.5)
TSH: 0.859 microintl units/mL (ref 0.350–5.50)
Total CHOL/HDL Ratio: 3.7
VLDL: 12 mg/dL (ref 0–40)

## 2007-03-13 ENCOUNTER — Telehealth: Payer: Self-pay | Admitting: *Deleted

## 2007-03-30 ENCOUNTER — Ambulatory Visit: Payer: Self-pay | Admitting: Internal Medicine

## 2007-03-30 DIAGNOSIS — G8922 Chronic post-thoracotomy pain: Secondary | ICD-10-CM

## 2007-03-30 LAB — CONVERTED CEMR LAB
Bilirubin Urine: NEGATIVE
Blood Glucose, Fingerstick: 114
Hemoglobin, Urine: NEGATIVE
Ketones, ur: NEGATIVE mg/dL
RBC / HPF: NONE SEEN (ref <3)
Urine Glucose: NEGATIVE mg/dL
WBC, UA: NONE SEEN cells/hpf (ref <3)
pH: 5.5 (ref 5.0–8.0)

## 2007-04-17 ENCOUNTER — Telehealth: Payer: Self-pay | Admitting: *Deleted

## 2007-05-08 ENCOUNTER — Encounter: Payer: Self-pay | Admitting: Internal Medicine

## 2007-06-05 ENCOUNTER — Telehealth: Payer: Self-pay | Admitting: *Deleted

## 2007-06-08 ENCOUNTER — Encounter: Payer: Self-pay | Admitting: Internal Medicine

## 2007-06-21 ENCOUNTER — Encounter: Payer: Self-pay | Admitting: Internal Medicine

## 2007-06-22 ENCOUNTER — Ambulatory Visit: Payer: Self-pay | Admitting: Oncology

## 2007-06-26 LAB — CBC WITH DIFFERENTIAL/PLATELET
Basophils Absolute: 0 10*3/uL (ref 0.0–0.1)
EOS%: 1.2 % (ref 0.0–7.0)
HCT: 38.5 % (ref 34.8–46.6)
HGB: 12.3 g/dL (ref 11.6–15.9)
MCH: 25.9 pg — ABNORMAL LOW (ref 26.0–34.0)
MCV: 80.9 fL — ABNORMAL LOW (ref 81.0–101.0)
MONO%: 8.3 % (ref 0.0–13.0)
NEUT%: 67.7 % (ref 39.6–76.8)
RDW: 20.2 % — ABNORMAL HIGH (ref 11.3–14.5)

## 2007-06-26 LAB — COMPREHENSIVE METABOLIC PANEL
ALT: 26 U/L (ref 0–35)
Alkaline Phosphatase: 66 U/L (ref 39–117)
Potassium: 3.8 mEq/L (ref 3.5–5.3)
Sodium: 142 mEq/L (ref 135–145)
Total Bilirubin: 0.3 mg/dL (ref 0.3–1.2)
Total Protein: 7.6 g/dL (ref 6.0–8.3)

## 2007-06-27 ENCOUNTER — Telehealth: Payer: Self-pay | Admitting: *Deleted

## 2007-06-28 ENCOUNTER — Ambulatory Visit: Payer: Self-pay | Admitting: Internal Medicine

## 2007-06-28 DIAGNOSIS — R609 Edema, unspecified: Secondary | ICD-10-CM | POA: Insufficient documentation

## 2007-06-28 LAB — CONVERTED CEMR LAB
ALT: 28 units/L (ref 0–35)
Alkaline Phosphatase: 63 units/L (ref 39–117)
Blood Glucose, Fingerstick: 140
Calcium: 9.3 mg/dL (ref 8.4–10.5)
Chloride: 103 meq/L (ref 96–112)
Creatinine, Ser: 0.69 mg/dL (ref 0.40–1.20)
Sodium: 143 meq/L (ref 135–145)
Total Bilirubin: 0.3 mg/dL (ref 0.3–1.2)
Total Protein: 8 g/dL (ref 6.0–8.3)

## 2007-06-30 ENCOUNTER — Telehealth: Payer: Self-pay | Admitting: *Deleted

## 2007-08-07 ENCOUNTER — Telehealth: Payer: Self-pay | Admitting: *Deleted

## 2007-08-23 ENCOUNTER — Ambulatory Visit: Payer: Self-pay | Admitting: Internal Medicine

## 2007-08-28 ENCOUNTER — Telehealth: Payer: Self-pay | Admitting: *Deleted

## 2007-09-08 ENCOUNTER — Encounter: Payer: Self-pay | Admitting: Licensed Clinical Social Worker

## 2007-09-12 ENCOUNTER — Telehealth (INDEPENDENT_AMBULATORY_CARE_PROVIDER_SITE_OTHER): Payer: Self-pay | Admitting: Pharmacy Technician

## 2007-09-27 ENCOUNTER — Encounter: Payer: Self-pay | Admitting: Internal Medicine

## 2007-09-28 ENCOUNTER — Ambulatory Visit: Payer: Self-pay | Admitting: Gynecology

## 2007-10-04 ENCOUNTER — Ambulatory Visit (HOSPITAL_COMMUNITY): Admission: RE | Admit: 2007-10-04 | Discharge: 2007-10-04 | Payer: Self-pay | Admitting: Family Medicine

## 2007-10-04 ENCOUNTER — Encounter: Payer: Self-pay | Admitting: Internal Medicine

## 2007-10-16 ENCOUNTER — Telehealth: Payer: Self-pay | Admitting: *Deleted

## 2007-10-18 ENCOUNTER — Encounter: Payer: Self-pay | Admitting: Internal Medicine

## 2007-10-25 ENCOUNTER — Ambulatory Visit: Payer: Self-pay | Admitting: Internal Medicine

## 2007-10-25 DIAGNOSIS — G4700 Insomnia, unspecified: Secondary | ICD-10-CM | POA: Insufficient documentation

## 2007-10-25 LAB — CONVERTED CEMR LAB
Blood Glucose, Fingerstick: 149
Hgb A1c MFr Bld: 7.2 %

## 2007-10-27 ENCOUNTER — Telehealth: Payer: Self-pay | Admitting: Licensed Clinical Social Worker

## 2007-10-31 ENCOUNTER — Ambulatory Visit: Payer: Self-pay | Admitting: Internal Medicine

## 2007-10-31 LAB — CONVERTED CEMR LAB: Blood Glucose, Home Monitor: 2 mg/dL

## 2007-11-03 ENCOUNTER — Encounter: Payer: Self-pay | Admitting: Infectious Diseases

## 2007-11-07 ENCOUNTER — Encounter: Payer: Self-pay | Admitting: Internal Medicine

## 2007-11-29 ENCOUNTER — Encounter: Payer: Self-pay | Admitting: Internal Medicine

## 2007-12-06 ENCOUNTER — Encounter: Payer: Self-pay | Admitting: Internal Medicine

## 2007-12-06 ENCOUNTER — Ambulatory Visit: Payer: Self-pay | Admitting: Internal Medicine

## 2007-12-06 DIAGNOSIS — R21 Rash and other nonspecific skin eruption: Secondary | ICD-10-CM

## 2007-12-06 DIAGNOSIS — M899 Disorder of bone, unspecified: Secondary | ICD-10-CM | POA: Insufficient documentation

## 2007-12-06 DIAGNOSIS — M949 Disorder of cartilage, unspecified: Secondary | ICD-10-CM

## 2007-12-06 LAB — CONVERTED CEMR LAB
Blood Glucose, Fingerstick: 118
Blood Glucose, Fingerstick: 76

## 2007-12-08 LAB — CONVERTED CEMR LAB
ALT: 20 units/L (ref 0–35)
AST: 16 units/L (ref 0–37)
Alkaline Phosphatase: 64 units/L (ref 39–117)
Basophils Absolute: 0 10*3/uL (ref 0.0–0.1)
Basophils Relative: 0 % (ref 0–1)
CO2: 19 meq/L (ref 19–32)
Creatinine, Ser: 0.79 mg/dL (ref 0.40–1.20)
Eosinophils Relative: 2 % (ref 0–5)
HCT: 36.3 % (ref 36.0–46.0)
Hemoglobin: 10.5 g/dL — ABNORMAL LOW (ref 12.0–15.0)
Lymphocytes Relative: 27 % (ref 12–46)
MCHC: 28.9 g/dL — ABNORMAL LOW (ref 30.0–36.0)
Monocytes Absolute: 0.7 10*3/uL (ref 0.1–1.0)
Neutro Abs: 4.3 10*3/uL (ref 1.7–7.7)
Platelets: 264 10*3/uL (ref 150–400)
RDW: 22.4 % — ABNORMAL HIGH (ref 11.5–15.5)
Sodium: 142 meq/L (ref 135–145)
Total Bilirubin: 0.3 mg/dL (ref 0.3–1.2)
Total Protein: 7.4 g/dL (ref 6.0–8.3)

## 2007-12-14 ENCOUNTER — Telehealth (INDEPENDENT_AMBULATORY_CARE_PROVIDER_SITE_OTHER): Payer: Self-pay | Admitting: *Deleted

## 2007-12-14 ENCOUNTER — Ambulatory Visit: Payer: Self-pay | Admitting: Internal Medicine

## 2007-12-20 ENCOUNTER — Ambulatory Visit: Payer: Self-pay | Admitting: Internal Medicine

## 2007-12-20 LAB — CONVERTED CEMR LAB: OCCULT 3: NEGATIVE

## 2008-01-04 LAB — CONVERTED CEMR LAB
Eosinophils Absolute: 0.1 10*3/uL (ref 0.0–0.7)
Eosinophils Relative: 1 % (ref 0–5)
HCT: 34.6 % — ABNORMAL LOW (ref 36.0–46.0)
Hemoglobin: 10.5 g/dL — ABNORMAL LOW (ref 12.0–15.0)
Lymphocytes Relative: 25 % (ref 12–46)
Lymphs Abs: 1.7 10*3/uL (ref 0.7–4.0)
MCV: 75.1 fL — ABNORMAL LOW (ref 78.0–100.0)
Monocytes Absolute: 0.4 10*3/uL (ref 0.1–1.0)
Monocytes Relative: 6 % (ref 3–12)
Platelets: 284 10*3/uL (ref 150–400)
RBC: 4.61 M/uL (ref 3.87–5.11)
Retic Ct Pct: 2 % (ref 0.4–3.1)
Saturation Ratios: 5 % — ABNORMAL LOW (ref 20–55)
WBC: 6.9 10*3/uL (ref 4.0–10.5)

## 2008-01-09 ENCOUNTER — Telehealth: Payer: Self-pay | Admitting: *Deleted

## 2008-01-16 ENCOUNTER — Telehealth: Payer: Self-pay | Admitting: *Deleted

## 2008-01-19 ENCOUNTER — Telehealth: Payer: Self-pay | Admitting: *Deleted

## 2008-01-29 ENCOUNTER — Ambulatory Visit: Payer: Self-pay | Admitting: Internal Medicine

## 2008-01-29 LAB — CONVERTED CEMR LAB: Blood Glucose, Fingerstick: 117

## 2008-01-31 ENCOUNTER — Ambulatory Visit: Payer: Self-pay | Admitting: Infectious Disease

## 2008-01-31 LAB — CONVERTED CEMR LAB: Blood Glucose, Fingerstick: 62

## 2008-02-07 ENCOUNTER — Telehealth: Payer: Self-pay | Admitting: Internal Medicine

## 2008-02-15 ENCOUNTER — Ambulatory Visit: Payer: Self-pay | Admitting: Internal Medicine

## 2008-02-15 ENCOUNTER — Encounter: Payer: Self-pay | Admitting: Internal Medicine

## 2008-02-15 LAB — CONVERTED CEMR LAB
AST: 15 units/L (ref 0–37)
Albumin: 4.4 g/dL (ref 3.5–5.2)
Alkaline Phosphatase: 61 units/L (ref 39–117)
Basophils Relative: 0 % (ref 0–1)
Calcium: 10.3 mg/dL (ref 8.4–10.5)
Chloride: 104 meq/L (ref 96–112)
Eosinophils Absolute: 0.1 10*3/uL (ref 0.0–0.7)
Glucose, Bld: 133 mg/dL — ABNORMAL HIGH (ref 70–99)
Lymphs Abs: 1.7 10*3/uL (ref 0.7–4.0)
MCV: 79.8 fL (ref 78.0–100.0)
Neutro Abs: 3.5 10*3/uL (ref 1.7–7.7)
Neutrophils Relative %: 61 % (ref 43–77)
Platelets: 304 10*3/uL (ref 150–400)
Potassium: 4.3 meq/L (ref 3.5–5.3)
RBC: 5.11 M/uL (ref 3.87–5.11)
Sodium: 140 meq/L (ref 135–145)
Total Protein: 8.4 g/dL — ABNORMAL HIGH (ref 6.0–8.3)
WBC: 5.8 10*3/uL (ref 4.0–10.5)

## 2008-03-04 ENCOUNTER — Telehealth (INDEPENDENT_AMBULATORY_CARE_PROVIDER_SITE_OTHER): Payer: Self-pay | Admitting: *Deleted

## 2008-03-19 ENCOUNTER — Telehealth: Payer: Self-pay | Admitting: *Deleted

## 2008-03-26 ENCOUNTER — Ambulatory Visit: Payer: Self-pay | Admitting: Internal Medicine

## 2008-05-01 ENCOUNTER — Encounter: Payer: Self-pay | Admitting: Internal Medicine

## 2008-05-08 ENCOUNTER — Ambulatory Visit: Payer: Self-pay | Admitting: Internal Medicine

## 2008-05-08 DIAGNOSIS — M79606 Pain in leg, unspecified: Secondary | ICD-10-CM | POA: Insufficient documentation

## 2008-05-09 ENCOUNTER — Ambulatory Visit: Payer: Self-pay | Admitting: Internal Medicine

## 2008-05-09 LAB — CONVERTED CEMR LAB
ALT: 16 units/L (ref 0–35)
BUN: 22 mg/dL (ref 6–23)
Basophils Absolute: 0 10*3/uL (ref 0.0–0.1)
CO2: 19 meq/L (ref 19–32)
Calcium: 10.1 mg/dL (ref 8.4–10.5)
Chloride: 101 meq/L (ref 96–112)
Cholesterol: 113 mg/dL (ref 0–200)
Creatinine, Ser: 0.99 mg/dL (ref 0.40–1.20)
Creatinine, Urine: 258.9 mg/dL
Glucose, Bld: 134 mg/dL — ABNORMAL HIGH (ref 70–99)
Hemoglobin: 12.8 g/dL (ref 12.0–15.0)
Lymphocytes Relative: 27 % (ref 12–46)
Lymphs Abs: 1.5 10*3/uL (ref 0.7–4.0)
Microalb Creat Ratio: 31.5 mg/g — ABNORMAL HIGH (ref 0.0–30.0)
Monocytes Absolute: 0.5 10*3/uL (ref 0.1–1.0)
Monocytes Relative: 10 % (ref 3–12)
Neutro Abs: 3.5 10*3/uL (ref 1.7–7.7)
RBC: 4.99 M/uL (ref 3.87–5.11)
TSH: 1.191 microintl units/mL (ref 0.350–4.50)
Total CHOL/HDL Ratio: 3.8

## 2008-06-12 ENCOUNTER — Ambulatory Visit: Payer: Self-pay | Admitting: Internal Medicine

## 2008-06-12 ENCOUNTER — Ambulatory Visit (HOSPITAL_COMMUNITY): Admission: RE | Admit: 2008-06-12 | Discharge: 2008-06-12 | Payer: Self-pay | Admitting: Internal Medicine

## 2008-06-12 LAB — CONVERTED CEMR LAB: Blood Glucose, Fingerstick: 118

## 2008-06-20 ENCOUNTER — Telehealth: Payer: Self-pay | Admitting: *Deleted

## 2008-06-21 ENCOUNTER — Ambulatory Visit: Payer: Self-pay | Admitting: Oncology

## 2008-06-25 ENCOUNTER — Encounter: Payer: Self-pay | Admitting: Internal Medicine

## 2008-06-28 ENCOUNTER — Encounter: Payer: Self-pay | Admitting: Internal Medicine

## 2008-07-12 ENCOUNTER — Encounter: Payer: Self-pay | Admitting: Internal Medicine

## 2008-07-16 ENCOUNTER — Telehealth: Payer: Self-pay | Admitting: *Deleted

## 2008-07-22 ENCOUNTER — Encounter: Payer: Self-pay | Admitting: Internal Medicine

## 2008-08-14 ENCOUNTER — Encounter: Payer: Self-pay | Admitting: Internal Medicine

## 2008-08-21 ENCOUNTER — Encounter: Payer: Self-pay | Admitting: Internal Medicine

## 2008-08-21 ENCOUNTER — Ambulatory Visit (HOSPITAL_COMMUNITY): Admission: RE | Admit: 2008-08-21 | Discharge: 2008-08-21 | Payer: Self-pay | Admitting: Cardiology

## 2008-08-28 ENCOUNTER — Encounter: Payer: Self-pay | Admitting: Internal Medicine

## 2008-08-28 ENCOUNTER — Ambulatory Visit: Payer: Self-pay | Admitting: Internal Medicine

## 2008-08-28 LAB — CONVERTED CEMR LAB: Hgb A1c MFr Bld: 6.6 %

## 2008-08-29 ENCOUNTER — Encounter: Payer: Self-pay | Admitting: Internal Medicine

## 2008-09-25 ENCOUNTER — Telehealth: Payer: Self-pay | Admitting: *Deleted

## 2008-10-16 ENCOUNTER — Telehealth: Payer: Self-pay | Admitting: Internal Medicine

## 2008-11-20 ENCOUNTER — Encounter: Payer: Self-pay | Admitting: Internal Medicine

## 2008-11-20 ENCOUNTER — Telehealth: Payer: Self-pay | Admitting: *Deleted

## 2008-11-26 ENCOUNTER — Telehealth: Payer: Self-pay | Admitting: *Deleted

## 2008-12-11 ENCOUNTER — Ambulatory Visit: Payer: Self-pay | Admitting: Internal Medicine

## 2008-12-11 LAB — CONVERTED CEMR LAB: Blood Glucose, Fingerstick: 119

## 2008-12-12 ENCOUNTER — Ambulatory Visit: Payer: Self-pay | Admitting: Internal Medicine

## 2008-12-13 ENCOUNTER — Encounter: Payer: Self-pay | Admitting: Internal Medicine

## 2008-12-13 LAB — CONVERTED CEMR LAB
ALT: 20 units/L (ref 0–35)
CO2: 24 meq/L (ref 19–32)
Cholesterol: 112 mg/dL (ref 0–200)
Creatinine, Ser: 0.84 mg/dL (ref 0.40–1.20)
Glucose, Bld: 114 mg/dL — ABNORMAL HIGH (ref 70–99)
HDL: 29 mg/dL — ABNORMAL LOW (ref >39)
Total Bilirubin: 0.2 mg/dL — ABNORMAL LOW (ref 0.3–1.2)
Total CHOL/HDL Ratio: 3.9
Triglycerides: 76 mg/dL (ref <150)
VLDL: 15 mg/dL (ref 0–40)

## 2008-12-16 ENCOUNTER — Telehealth: Payer: Self-pay | Admitting: Internal Medicine

## 2008-12-26 ENCOUNTER — Encounter: Payer: Self-pay | Admitting: Internal Medicine

## 2009-01-08 ENCOUNTER — Encounter: Payer: Self-pay | Admitting: Internal Medicine

## 2009-02-12 ENCOUNTER — Ambulatory Visit: Payer: Self-pay | Admitting: Internal Medicine

## 2009-02-12 ENCOUNTER — Ambulatory Visit (HOSPITAL_COMMUNITY): Admission: RE | Admit: 2009-02-12 | Discharge: 2009-02-12 | Payer: Self-pay | Admitting: Internal Medicine

## 2009-02-12 LAB — CONVERTED CEMR LAB
Blood Glucose, Fingerstick: 131
Hgb A1c MFr Bld: 7.1 %

## 2009-02-13 ENCOUNTER — Telehealth: Payer: Self-pay | Admitting: Internal Medicine

## 2009-02-18 ENCOUNTER — Telehealth: Payer: Self-pay | Admitting: Internal Medicine

## 2009-02-19 ENCOUNTER — Ambulatory Visit: Payer: Self-pay | Admitting: Cardiovascular Disease

## 2009-02-19 ENCOUNTER — Ambulatory Visit (HOSPITAL_COMMUNITY): Admission: RE | Admit: 2009-02-19 | Discharge: 2009-02-19 | Payer: Self-pay | Admitting: Internal Medicine

## 2009-02-19 ENCOUNTER — Encounter: Payer: Self-pay | Admitting: Internal Medicine

## 2009-03-12 ENCOUNTER — Encounter: Payer: Self-pay | Admitting: Internal Medicine

## 2009-03-17 ENCOUNTER — Telehealth: Payer: Self-pay | Admitting: *Deleted

## 2009-03-23 ENCOUNTER — Encounter: Payer: Self-pay | Admitting: Internal Medicine

## 2009-04-17 ENCOUNTER — Telehealth (INDEPENDENT_AMBULATORY_CARE_PROVIDER_SITE_OTHER): Payer: Self-pay | Admitting: *Deleted

## 2009-04-23 ENCOUNTER — Ambulatory Visit: Payer: Self-pay | Admitting: Internal Medicine

## 2009-04-26 DIAGNOSIS — J301 Allergic rhinitis due to pollen: Secondary | ICD-10-CM

## 2009-04-26 LAB — CONVERTED CEMR LAB
Alkaline Phosphatase: 63 units/L (ref 39–117)
BUN: 19 mg/dL (ref 6–23)
Glucose, Bld: 138 mg/dL — ABNORMAL HIGH (ref 70–99)
Sodium: 141 meq/L (ref 135–145)
Total Bilirubin: 0.3 mg/dL (ref 0.3–1.2)
Total Protein: 7.9 g/dL (ref 6.0–8.3)

## 2009-04-28 ENCOUNTER — Ambulatory Visit: Payer: Self-pay | Admitting: Internal Medicine

## 2009-04-28 LAB — CONVERTED CEMR LAB
BUN: 16 mg/dL (ref 6–23)
CO2: 25 meq/L (ref 19–32)
Chloride: 105 meq/L (ref 96–112)
Glucose, Bld: 186 mg/dL — ABNORMAL HIGH (ref 70–99)
Potassium: 3.7 meq/L (ref 3.5–5.3)

## 2009-04-29 ENCOUNTER — Encounter: Payer: Self-pay | Admitting: Internal Medicine

## 2009-05-01 ENCOUNTER — Ambulatory Visit (HOSPITAL_BASED_OUTPATIENT_CLINIC_OR_DEPARTMENT_OTHER): Admission: RE | Admit: 2009-05-01 | Discharge: 2009-05-01 | Payer: Self-pay | Admitting: Internal Medicine

## 2009-05-01 ENCOUNTER — Ambulatory Visit: Payer: Self-pay | Admitting: Internal Medicine

## 2009-05-10 ENCOUNTER — Ambulatory Visit: Payer: Self-pay | Admitting: Internal Medicine

## 2009-05-14 ENCOUNTER — Encounter: Payer: Self-pay | Admitting: Internal Medicine

## 2009-05-21 ENCOUNTER — Telehealth: Payer: Self-pay | Admitting: *Deleted

## 2009-05-26 ENCOUNTER — Telehealth: Payer: Self-pay | Admitting: *Deleted

## 2009-05-26 ENCOUNTER — Telehealth (INDEPENDENT_AMBULATORY_CARE_PROVIDER_SITE_OTHER): Payer: Self-pay | Admitting: *Deleted

## 2009-06-17 ENCOUNTER — Encounter: Payer: Self-pay | Admitting: Internal Medicine

## 2009-06-18 ENCOUNTER — Ambulatory Visit: Payer: Self-pay | Admitting: Internal Medicine

## 2009-06-18 DIAGNOSIS — R809 Proteinuria, unspecified: Secondary | ICD-10-CM | POA: Insufficient documentation

## 2009-06-18 LAB — CONVERTED CEMR LAB: Blood Glucose, Fingerstick: 172

## 2009-06-24 ENCOUNTER — Ambulatory Visit: Payer: Self-pay | Admitting: Oncology

## 2009-06-24 ENCOUNTER — Ambulatory Visit (HOSPITAL_COMMUNITY): Admission: RE | Admit: 2009-06-24 | Discharge: 2009-06-24 | Payer: Self-pay | Admitting: Oncology

## 2009-06-26 ENCOUNTER — Encounter: Payer: Self-pay | Admitting: Internal Medicine

## 2009-06-26 LAB — CBC & DIFF AND RETIC
BASO%: 0.3 % (ref 0.0–2.0)
EOS%: 0.8 % (ref 0.0–7.0)
LYMPH%: 26.2 % (ref 14.0–49.7)
MCH: 26.8 pg (ref 25.1–34.0)
MCHC: 31.5 g/dL (ref 31.5–36.0)
MCV: 85.2 fL (ref 79.5–101.0)
MONO%: 8.6 % (ref 0.0–14.0)
NEUT#: 3.9 10*3/uL (ref 1.5–6.5)
Platelets: 215 10*3/uL (ref 145–400)
RBC: 4.4 10*6/uL (ref 3.70–5.45)
RDW: 17.4 % — ABNORMAL HIGH (ref 11.2–14.5)
Retic %: 1.42 % (ref 0.50–1.50)
nRBC: 0 % (ref 0–0)

## 2009-06-27 LAB — BASIC METABOLIC PANEL
BUN: 20 mg/dL (ref 6–23)
CO2: 24 mEq/L (ref 19–32)
Calcium: 9.3 mg/dL (ref 8.4–10.5)
Creatinine, Ser: 0.78 mg/dL (ref 0.40–1.20)
Glucose, Bld: 101 mg/dL — ABNORMAL HIGH (ref 70–99)
Sodium: 140 mEq/L (ref 135–145)

## 2009-06-27 LAB — FERRITIN: Ferritin: 108 ng/mL (ref 10–291)

## 2009-07-09 ENCOUNTER — Encounter: Payer: Self-pay | Admitting: Internal Medicine

## 2009-07-30 ENCOUNTER — Ambulatory Visit: Payer: Self-pay | Admitting: Obstetrics and Gynecology

## 2009-07-30 ENCOUNTER — Ambulatory Visit: Payer: Self-pay | Admitting: Internal Medicine

## 2009-07-30 LAB — CONVERTED CEMR LAB: Blood Glucose, Fingerstick: 91

## 2009-07-31 ENCOUNTER — Encounter: Payer: Self-pay | Admitting: Obstetrics and Gynecology

## 2009-07-31 ENCOUNTER — Ambulatory Visit: Payer: Self-pay | Admitting: Internal Medicine

## 2009-07-31 LAB — CONVERTED CEMR LAB
ALT: 17 units/L (ref 0–35)
AST: 14 units/L (ref 0–37)
BUN: 19 mg/dL (ref 6–23)
Basophils Relative: 0 % (ref 0–1)
CO2: 20 meq/L (ref 19–32)
Cholesterol: 118 mg/dL (ref 0–200)
Creatinine, Ser: 0.74 mg/dL (ref 0.40–1.20)
Eosinophils Absolute: 0 10*3/uL (ref 0.0–0.7)
Eosinophils Relative: 1 % (ref 0–5)
HDL: 27 mg/dL — ABNORMAL LOW (ref >39)
MCHC: 31.3 g/dL (ref 30.0–36.0)
MCV: 85.3 fL (ref >=78.0)
Neutrophils Relative %: 68 % (ref 43–77)
Platelets: 250 10*3/uL (ref 150–400)
Total Bilirubin: 0.3 mg/dL (ref 0.3–1.2)
Total CHOL/HDL Ratio: 4.4
VLDL: 17 mg/dL (ref 0–40)

## 2009-08-06 ENCOUNTER — Ambulatory Visit (HOSPITAL_COMMUNITY): Admission: RE | Admit: 2009-08-06 | Discharge: 2009-08-06 | Payer: Self-pay | Admitting: Obstetrics and Gynecology

## 2009-08-06 ENCOUNTER — Telehealth: Payer: Self-pay | Admitting: *Deleted

## 2009-08-08 LAB — CONVERTED CEMR LAB
Creatinine, Urine: 29.6 mg/dL
Microalb Creat Ratio: 1158.1 mg/g — ABNORMAL HIGH (ref 0.0–30.0)
Microalb, Ur: 34.28 mg/dL — ABNORMAL HIGH (ref 0.00–1.89)

## 2009-08-13 ENCOUNTER — Ambulatory Visit: Payer: Self-pay | Admitting: Sports Medicine

## 2009-08-13 ENCOUNTER — Encounter: Payer: Self-pay | Admitting: Internal Medicine

## 2009-08-13 ENCOUNTER — Encounter: Payer: Self-pay | Admitting: Family Medicine

## 2009-08-13 DIAGNOSIS — M67919 Unspecified disorder of synovium and tendon, unspecified shoulder: Secondary | ICD-10-CM | POA: Insufficient documentation

## 2009-08-13 DIAGNOSIS — M719 Bursopathy, unspecified: Secondary | ICD-10-CM

## 2009-08-19 ENCOUNTER — Encounter: Payer: Self-pay | Admitting: Internal Medicine

## 2009-08-27 ENCOUNTER — Encounter
Admission: RE | Admit: 2009-08-27 | Discharge: 2009-09-29 | Payer: Self-pay | Source: Home / Self Care | Admitting: Family Medicine

## 2009-08-27 ENCOUNTER — Encounter: Payer: Self-pay | Admitting: Family Medicine

## 2009-09-04 ENCOUNTER — Encounter: Payer: Self-pay | Admitting: Internal Medicine

## 2009-09-10 ENCOUNTER — Encounter: Payer: Self-pay | Admitting: Internal Medicine

## 2009-09-17 ENCOUNTER — Ambulatory Visit: Payer: Self-pay | Admitting: Internal Medicine

## 2009-09-17 ENCOUNTER — Encounter: Payer: Self-pay | Admitting: Family Medicine

## 2009-09-17 ENCOUNTER — Ambulatory Visit: Payer: Self-pay | Admitting: Sports Medicine

## 2009-09-17 LAB — CONVERTED CEMR LAB: Hgb A1c MFr Bld: 6.8 %

## 2009-09-18 ENCOUNTER — Telehealth: Payer: Self-pay | Admitting: *Deleted

## 2009-09-29 ENCOUNTER — Encounter: Payer: Self-pay | Admitting: Family Medicine

## 2009-10-01 ENCOUNTER — Encounter: Payer: Self-pay | Admitting: Internal Medicine

## 2009-10-08 ENCOUNTER — Ambulatory Visit: Payer: Self-pay | Admitting: Sports Medicine

## 2009-10-16 ENCOUNTER — Telehealth: Payer: Self-pay | Admitting: Internal Medicine

## 2009-10-23 ENCOUNTER — Ambulatory Visit: Payer: Self-pay | Admitting: Internal Medicine

## 2009-10-23 DIAGNOSIS — I739 Peripheral vascular disease, unspecified: Secondary | ICD-10-CM

## 2009-10-31 ENCOUNTER — Encounter: Payer: Self-pay | Admitting: Internal Medicine

## 2009-10-31 ENCOUNTER — Ambulatory Visit: Payer: Self-pay

## 2009-11-11 ENCOUNTER — Telehealth: Payer: Self-pay | Admitting: Internal Medicine

## 2009-12-03 ENCOUNTER — Ambulatory Visit (HOSPITAL_COMMUNITY): Admission: RE | Admit: 2009-12-03 | Discharge: 2009-12-03 | Payer: Self-pay | Admitting: Internal Medicine

## 2009-12-03 ENCOUNTER — Ambulatory Visit: Payer: Self-pay | Admitting: Internal Medicine

## 2009-12-15 ENCOUNTER — Telehealth: Payer: Self-pay | Admitting: Internal Medicine

## 2009-12-23 ENCOUNTER — Encounter: Payer: Self-pay | Admitting: Internal Medicine

## 2009-12-26 ENCOUNTER — Encounter: Payer: Self-pay | Admitting: Internal Medicine

## 2010-01-14 ENCOUNTER — Encounter: Payer: Self-pay | Admitting: Internal Medicine

## 2010-01-16 ENCOUNTER — Telehealth: Payer: Self-pay | Admitting: *Deleted

## 2010-01-16 ENCOUNTER — Encounter: Payer: Self-pay | Admitting: Internal Medicine

## 2010-02-11 ENCOUNTER — Ambulatory Visit: Payer: Self-pay | Admitting: Internal Medicine

## 2010-02-16 ENCOUNTER — Telehealth: Payer: Self-pay | Admitting: *Deleted

## 2010-03-03 ENCOUNTER — Ambulatory Visit: Payer: Self-pay | Admitting: Sports Medicine

## 2010-03-04 ENCOUNTER — Telehealth (INDEPENDENT_AMBULATORY_CARE_PROVIDER_SITE_OTHER): Payer: Self-pay | Admitting: *Deleted

## 2010-03-04 ENCOUNTER — Encounter: Payer: Self-pay | Admitting: Internal Medicine

## 2010-03-11 ENCOUNTER — Encounter: Payer: Self-pay | Admitting: Internal Medicine

## 2010-03-17 ENCOUNTER — Telehealth: Payer: Self-pay | Admitting: Internal Medicine

## 2010-03-25 ENCOUNTER — Telehealth (INDEPENDENT_AMBULATORY_CARE_PROVIDER_SITE_OTHER): Payer: Self-pay | Admitting: *Deleted

## 2010-03-30 ENCOUNTER — Ambulatory Visit (HOSPITAL_COMMUNITY)
Admission: RE | Admit: 2010-03-30 | Discharge: 2010-03-30 | Payer: Self-pay | Source: Home / Self Care | Attending: Internal Medicine | Admitting: Internal Medicine

## 2010-03-30 ENCOUNTER — Ambulatory Visit: Payer: Self-pay | Admitting: Internal Medicine

## 2010-03-30 LAB — CONVERTED CEMR LAB
CO2: 24 meq/L (ref 19–32)
Calcium: 9.5 mg/dL (ref 8.4–10.5)
Creatinine, Ser: 0.9 mg/dL (ref 0.40–1.20)
Glucose, Bld: 89 mg/dL (ref 70–99)
Sodium: 140 meq/L (ref 135–145)
Total CK: 74 units/L (ref 7–177)

## 2010-04-14 ENCOUNTER — Telehealth: Payer: Self-pay | Admitting: Internal Medicine

## 2010-04-22 ENCOUNTER — Ambulatory Visit: Admit: 2010-04-22 | Payer: Self-pay

## 2010-04-22 ENCOUNTER — Encounter: Payer: Self-pay | Admitting: Internal Medicine

## 2010-04-22 ENCOUNTER — Ambulatory Visit: Admission: RE | Admit: 2010-04-22 | Discharge: 2010-04-22 | Payer: Self-pay | Source: Home / Self Care

## 2010-05-10 ENCOUNTER — Encounter: Payer: Self-pay | Admitting: Thoracic Surgery

## 2010-05-10 ENCOUNTER — Encounter: Payer: Self-pay | Admitting: Cardiology

## 2010-05-17 LAB — CONVERTED CEMR LAB
Blood Glucose, Fingerstick: 163
Hgb A1c MFr Bld: 6.9 %

## 2010-05-21 NOTE — Miscellaneous (Signed)
Summary: [facility]   Imported By: Shon Hough 01/19/2010 15:53:30  _____________________________________________________________________  External Attachment:    Type:   Image     Comment:   External Document

## 2010-05-21 NOTE — Letter (Signed)
Summary: Pharmacologist   Imported By: Florinda Marker 05/22/2009 09:08:17  _____________________________________________________________________  External Attachment:    Type:   Image     Comment:   External Document

## 2010-05-21 NOTE — Consult Note (Signed)
Summary: [facility]  [facility]   Imported By: Margie Billet 08/15/2009 11:49:24  _____________________________________________________________________  External Attachment:    Type:   Image     Comment:   External Document

## 2010-05-21 NOTE — Consult Note (Signed)
Summary: [facility]   Imported By: Louretta Parma 03/26/2010 11:34:09  _____________________________________________________________________  External Attachment:    Type:   Image     Comment:   External Document  Appended Document: [facility]   Diabetic Eye Exam  Procedure date:  03/04/2010  Findings:      Vitreous hemorrhage. Proliferative diabetic retinopathy. Vitreous detachment. See full report. Exam by Alford Highland. Rankin, MD

## 2010-05-21 NOTE — Assessment & Plan Note (Signed)
Summary: 6WK F/U/EST/VS   Vital Signs:  Patient profile:   65 year old female Height:      64 inches Weight:      272.4 pounds BMI:     46.93 Temp:     97.1 degrees F oral Pulse rate:   69 / minute BP sitting:   120 / 63  (right arm)  Vitals Entered By: Filomena Jungling NT II (July 30, 2009 8:41 AM) CC: follow-up visit/ still has trouble sleeping Is Patient Diabetic? No Pain Assessment Patient in pain? no      Nutritional Status BMI of > 30 = obese CBG Result 91  Have you ever been in a relationship where you felt threatened, hurt or afraid?No   Does patient need assistance? Functional Status Self care Ambulation Normal   Primary Care Provider:  Margarito Liner, MD  CC:  follow-up visit/ still has trouble sleeping.  History of Present Illness: Patient returns for follow-up of her diabetes mellitus, hypertension, obstructive sleep apnea, COPD, right shoulder pain and other medical problems.  Following her last visit, her CPAP mask was replaced with the type of mask used in her most recent titration, and she reports that she is doing well with the mask. She still complains of insomnia, and says that she is able to go to sleep but wakes up repeatedly and is unable to sleep through the night. She feels tired and fatigued during the day.  When she was taking Ambien previously, it did not help significantly with her insomnia. She recently has been taking no sleep medication. She still has persisting right shoulder pain aggravated by range of motion. She also reports bilateral anterior thigh pain, especially when walking, which she has noted for several weeks. She reports that she is compliant with her medications. She brought her blood glucose meter, and her 30 day average is 131; her blood sugars appear to be well controlled.  Preventive Screening-Counseling & Management  Alcohol-Tobacco     Smoking Status: quit     Year Quit: 2002  Caffeine-Diet-Exercise     Does Patient Exercise:  no  Current Medications (verified): 1)  Glucophage 850 Mg Tabs (Metformin Hcl) .... Take 1 Tablet By Mouth Three Times A Day 2)  Nexium 40 Mg  Pack (Esomeprazole Magnesium) .... Take 1 Tablet By Mouth Once A Day 3)  Nasonex 50 Mcg/act Susp (Mometasone Furoate) .... Take 2 Sprays in Each Nostril Once A Day 4)  Aggrenox 25-200 Mg Cp12 (Aspirin-Dipyridamole) .... Take 1 Capsule By Mouth Two Times A Day 5)  Lortab 10 10-500 Mg  Tabs (Hydrocodone-Acetaminophen) .... Take 1 Tablet By Mouth Four Times A Day As Needed For Pain 6)  Diovan 160 Mg Tabs (Valsartan) .... Take 1 Tablet By Mouth Two Times A Day 7)  Advair Diskus 250-50 Mcg/dose Misc (Fluticasone-Salmeterol) .... Inhale 1 Puff Two Times A Day 8)  Multivitamins  Tabs (Multiple Vitamin) .... Take 1 Tablet By Mouth Once A Day 9)  Bd Insulin Syringe U-100 1 Ml  Misc (Insulin Syringes (Disposable)) 10)  Calcium 600/vitamin D 600-400 Mg-Unit Tabs (Calcium Carbonate-Vitamin D) .... Take 1 Tablet By Mouth Two Times A Day 11)  Accu-Chek Compact Test Drum  Strp (Glucose Blood) .... Use To Test Blood Sugar Before & 2 Hours After Each Meal ( 5-6x Daily) 12)  Ferrous Sulfate 325 (65 Fe) Mg Tabs (Ferrous Sulfate) .... Take 1 Tablet By Mouth Three Times A Day 13)  Lantus Solostar 100 Unit/ml Soln (Insulin Glargine) .... Inject  38 Units Subcutaneously Each Evening 14)  Pen Needles 31g X 8 Mm Misc (Insulin Pen Needle) .... Use To Inject Insulin Once Daily 15)  Amlodipine Besylate 10 Mg Tabs (Amlodipine Besylate) .... Take 1 Tablet By Mouth Once A Day 16)  Lidoderm 5 % Ptch (Lidocaine) .... Apply One Patch To Right Chest Wall For Pain As Directed, On 12 Hours Off 12 Hours 17)  Carvedilol 12.5 Mg Tabs (Carvedilol) .... Take 1 Tablet By Mouth Two Times A Day 18)  Proair Hfa 108 (90 Base) Mcg/act Aers (Albuterol Sulfate) .... Inhale 2 Puffs Four Times A Day As Needed For Shortness of Breath. 19)  Januvia 50 Mg Tabs (Sitagliptin Phosphate) .... Take 1 Tablet By  Mouth Once A Day 20)  Spiriva Handihaler 18 Mcg Caps (Tiotropium Bromide Monohydrate) .... Inhale Contents of 1 Capsule Once A Day 21)  Fexofenadine Hcl 60 Mg Tabs (Fexofenadine Hcl) .... Take 1 Tablet By Mouth Two Times A Day 22)  Hydrochlorothiazide 25 Mg Tabs (Hydrochlorothiazide) .... Take 1 Tablet By Mouth Once A Day 23)  Lipitor 80 Mg Tabs (Atorvastatin Calcium) .... Take 1 Tablet By Mouth Once A Day  Allergies (verified): No Known Drug Allergies  Review of Systems General:  Complains of fatigue; denies chills and fever. Resp:  respiratory status is stable, with no recent increase in symptoms. GI:  Complains of nausea; denies abdominal pain and bloody stools. GU:  Denies dysuria and urinary frequency. MS:  Complains of muscle aches.  Physical Exam  General:  alert, no distress Lungs:  normal respiratory effort, normal breath sounds, no crackles, and no wheezes.   Heart:  normal rate, regular rhythm, 1/6 systolic ejection murmur; no gallop, and no rub.   Extremities:  no edema   Impression & Recommendations:  Problem # 1:  DIABETES MELLITUS, TYPE II (ICD-250.00) Patient's hemoglobin A1c is at goal on her current regimen. The plan is to continue current medications, and check a metabolic panel today.  Her updated medication list for this problem includes:    Glucophage 850 Mg Tabs (Metformin hcl) .[name]... Take 1 tablet by mouth three times a day    Diovan 160 Mg Tabs (Valsartan) .[name]... Take 1 tablet by mouth two times a day    Lantus Solostar 100 Unit/ml Soln (Insulin glargine) ..... Inject 38 units subcutaneously each evening    Januvia 50 Mg Tabs (Sitagliptin phosphate) .[name]... Take 1 tablet by mouth once a day  Labs Reviewed: Creat: 0.78 (06/26/2009)     Last Eye Exam: Proliferative diabetic retinopathy OU - stable. Exam by Alford Highland. Rankin, MD (11/20/2008) Reviewed HgBA1c results: 7.0 (06/18/2009)  7.3 (04/23/2009)  Future Orders: T-Comprehensive Metabolic Panel  ([id number]) ... 07/31/2009  Problem # 2:  HYPERTENSION (ICD-401.9) Patient's blood pressure is well controlled on current regimen.  Plan is to continue current antihypertensive medications.   Her updated medication list for this problem includes:    Diovan 160 Mg Tabs (Valsartan) .[name]... Take 1 tablet by mouth two times a day    Amlodipine Besylate 10 Mg Tabs (Amlodipine besylate) .[name]... Take 1 tablet by mouth once a day    Carvedilol 12.5 Mg Tabs (Carvedilol) .[name]... Take 1 tablet by mouth two times a day    Hydrochlorothiazide 25 Mg Tabs (Hydrochlorothiazide) .[name]... Take 1 tablet by mouth once a day  BP today: 120/63 Prior BP: 158/82 (06/18/2009)  Prior 10 Yr Risk Heart Disease: 24 % (11/24/2006)  Labs Reviewed: K+: 4.3 (06/26/2009) Creat: : 0.78 (06/26/2009)   Chol: 112 (  12/13/2008)   HDL: 29 (12/13/2008)   LDL: 68 (12/13/2008)   TG: 76 (12/13/2008)  Problem # 3:  HYPERCHOLESTEROLEMIA (ICD-272.0) Patient's last LDL was at goal on her current dose of Lipitor. However, I have some concerns as to whether her recent myalgias may be related to her statin medication (see discussion of myalgias below).  The plan for now is to continue Lipitor at current dose and check a total CK level in order to rule out rhabdomyolysis.  If the CK is normal, will continue Lipitor for now, but if her myalgias persist then I will switch her to an alternative statin medication such as Crestor. She is in agreement with this plan, and will let me know if her symptoms worsen prior to her return visit.  Her updated medication list for this problem includes:    Lipitor 80 Mg Tabs (Atorvastatin calcium) .[name]... Take 1 tablet by mouth once a day  Labs Reviewed: SGOT: 14 (04/23/2009)   SGPT: 15 (04/23/2009)  Prior 10 Yr Risk Heart Disease: 24 % (11/24/2006)   HDL:29 (12/13/2008), 30 (05/09/2008)  LDL:68 (12/13/2008), 64 (05/09/2008)  Chol:112 (12/13/2008), 113 (05/09/2008)  Trig:76 (12/13/2008), 95 (05/09/2008)  Future  Orders: T-Lipid Profile ([id number]) ... 07/31/2009  Problem # 4:  SHOULDER PAIN, RIGHT (ICD-719.41) Patient has persisting right shoulder pain for more than one month, which may represent a rotator cuff problem. The plan is to refer to sports medicine.  Her updated medication list for this problem includes:    Lortab 10 10-500 Mg Tabs (Hydrocodone-acetaminophen) .[name]... Take 1 tablet by mouth four times a day as needed for pain  Orders: Sports Medicine (Sports Med)  Problem # 5:  OBSTRUCTIVE SLEEP APNEA (ICD-327.23) Patient appears to be doing well with her new CPAP mask.  Problem # 6:  COPD (ICD-496) Patient's respiratory status is stable on current inhaler regimen.  Her updated medication list for this problem includes:    Advair Diskus 250-50 Mcg/dose Misc (Fluticasone-salmeterol) ..... Inhale 1 puff two times a day    Proair Hfa 108 (90 Base) Mcg/act Aers (Albuterol sulfate) ..... Inhale 2 puffs four times a day as needed for shortness of breath.    Spiriva Handihaler 18 Mcg Caps (Tiotropium bromide monohydrate) ..... Inhale contents of 1 capsule once a day  Problem # 7:  INSOMNIA (ICD-780.52) Patient has persisting insomnia, and did not respond to Ambien or ramelteon in the past.  The plan is to try Lunesta, and given her chronic respiratory issues I will start with a low dose of 1 mg at bedtime. I advised herlet me know if she has any daytime grogginess or other problems.  Her updated medication list for this problem includes:    Lunesta 1 Mg Tabs (Eszopiclone) .[name]... Take 1 tablet by mouth at bedtime as needed for insomnia  Problem # 8:  CONGESTIVE HEART FAILURE (ICD-428.0) Patient appears to be well compensated on current medication regimen.  Her updated medication list for this problem includes:    Aggrenox 25-200 Mg Cp12 (Aspirin-dipyridamole) .[name]... Take 1 capsule by mouth two times a day    Diovan 160 Mg Tabs (Valsartan) .[name]... Take 1 tablet by mouth two times a day     Carvedilol 12.5 Mg Tabs (Carvedilol) .[name]... Take 1 tablet by mouth two times a day    Hydrochlorothiazide 25 Mg Tabs (Hydrochlorothiazide) .[name]... Take 1 tablet by mouth once a day  Problem # 9:  MYALGIA (ICD-729.1) Patient reports bilateral thigh pain with exertion which has been worse for  several weeks. The etiology is not clear; it could relate to obesity and deconditioning, but I am concerned that it may represent a side effect of her statin medication. I discussed this with her. She feels that if she could simply get a good night's sleep, that she would feel better in terms of fatigue and possibly myalgias. The plan for now is to continue Lipitor at current dose and check a total CK level in order to rule out rhabdomyolysis.  If the CK is normal, will continue Lipitor for now, but if her myalgias persist then I will switch her to an alternative statin medication such as Crestor. She is in agreement with this plan, and will let me know if her symptoms worsen prior to her return visit.  Her updated medication list for this problem includes:    Lortab 10 10-500 Mg Tabs (Hydrocodone-acetaminophen) .[name]... Take 1 tablet by mouth four times a day as needed for pain  Future Orders: T-CK Total ([id number]) ... 07/31/2009  Complete Medication List: 1)  Glucophage 850 Mg Tabs (Metformin hcl) .... Take 1 tablet by mouth three times a day 2)  Nexium 40 Mg Pack (Esomeprazole magnesium) .... Take 1 tablet by mouth once a day 3)  Nasonex 50 Mcg/act Susp (Mometasone furoate) .... Take 2 sprays in each nostril once a day 4)  Aggrenox 25-200 Mg Cp12 (Aspirin-dipyridamole) .... Take 1 capsule by mouth two times a day 5)  Lortab 10 10-500 Mg Tabs (Hydrocodone-acetaminophen) .... Take 1 tablet by mouth four times a day as needed for pain 6)  Diovan 160 Mg Tabs (Valsartan) .... Take 1 tablet by mouth two times a day 7)  Advair Diskus 250-50 Mcg/dose Misc (Fluticasone-salmeterol) .... Inhale 1 puff two times a day 8)   Multivitamins Tabs (Multiple vitamin) .... Take 1 tablet by mouth once a day 9)  Bd Insulin Syringe U-100 1 Ml Misc (Insulin syringes (disposable)) 10)  Calcium 600/vitamin D 600-400 Mg-unit Tabs (Calcium carbonate-vitamin d) .... Take 1 tablet by mouth two times a day 11)  Accu-chek Compact Test Drum Strp (Glucose blood) .... Use to test blood sugar before & 2 hours after each meal ( 5-6x daily) 12)  Ferrous Sulfate 325 (65 Fe) Mg Tabs (Ferrous sulfate) .... Take 1 tablet by mouth three times a day 13)  Lantus Solostar 100 Unit/ml Soln (Insulin glargine) .... Inject 38 units subcutaneously each evening 14)  Pen Needles 31g X 8 Mm Misc (Insulin pen needle) .... Use to inject insulin once daily 15)  Amlodipine Besylate 10 Mg Tabs (Amlodipine besylate) .... Take 1 tablet by mouth once a day 16)  Lidoderm 5 % Ptch (Lidocaine) .... Apply one patch to right chest wall for pain as directed, on 12 hours off 12 hours 17)  Carvedilol 12.5 Mg Tabs (Carvedilol) .... Take 1 tablet by mouth two times a day 18)  Proair Hfa 108 (90 Base) Mcg/act Aers (Albuterol sulfate) .... Inhale 2 puffs four times a day as needed for shortness of breath. 19)  Januvia 50 Mg Tabs (Sitagliptin phosphate) .... Take 1 tablet by mouth once a day 20)  Spiriva Handihaler 18 Mcg Caps (Tiotropium bromide monohydrate) .... Inhale contents of 1 capsule once a day 21)  Fexofenadine Hcl 60 Mg Tabs (Fexofenadine hcl) .... Take 1 tablet by mouth two times a day 22)  Hydrochlorothiazide 25 Mg Tabs (Hydrochlorothiazide) .... Take 1 tablet by mouth once a day 23)  Lipitor 80 Mg Tabs (Atorvastatin calcium) .... Take 1 tablet by mouth  once a day 24)  Lunesta 1 Mg Tabs (Eszopiclone) .... Take 1 tablet by mouth at bedtime as needed for insomnia  Other Orders: Future Orders: T-CBC w/Diff ([id number]) ... 07/31/2009  Patient Instructions: 1)  Please schedule a follow-up appointment in 6 weeks. 2)  Start Lunesta 1 mg immediately before bedtime  as needed for insomnia. 3)  Return for fasting blood work later this week. 4)  An appointment has been requested with Sports Medicine for evaluation and treatment of your right shoulder pain.  Prescriptions: LUNESTA 1 MG TABS (ESZOPICLONE) Take 1 tablet by mouth at bedtime as needed for insomnia  #30 x 1   Entered and Authorized by:   Margarito Liner MD   Signed by:   Margarito Liner MD on 07/30/2009   Method used:   Print then Give to Patient   RxID:   [id number]   Prevention & Chronic Care Immunizations   Influenza vaccine: Fluvax 3+  (02/12/2009)   Influenza vaccine due: 12/18/2009    Tetanus booster: 02/12/2009: Tdap   Tetanus booster due: 02/13/2019    Pneumococcal vaccine: Pneumovax  (02/01/2001)   Pneumococcal vaccine due: 09/10/2012    H. zoster vaccine: Not documented  Colorectal Screening   Hemoccult: Negative X3  (05/01/2009)   Hemoccult action/deferral: Ordered  (04/23/2009)   Hemoccult due: 12/19/2008    Colonoscopy: Normal  (02/13/2004)   Colonoscopy due: 02/12/2014  Other Screening   Pap smear: Normal  (10/13/2001)   Pap smear action/deferral: GYN Referral  (06/18/2009)   Pap smear due: 10/14/2002    Mammogram: Assessment: BIRADS 1: Negative   Location: [facility]   (01/08/2009)   Mammogram due: 01/2010    DXA bone density scan: AP Spine L1-L4:  T-score -1.4 (Osteopenia) Dual Femur Neck Left:  T-score -0.6 (Normal) Dual Femur Neck Right: T-score -1.0 (Normal)    (10/04/2007)   DXA scan due: None   Reports requested:  Smoking status: quit  (07/30/2009)  Diabetes Mellitus   HgbA1C: 7.0  (06/18/2009)   HgbA1C action/deferral: Ordered  (12/11/2008)   Hemoglobin A1C due: 05/27/2007    Eye exam: Proliferative diabetic retinopathy OU - stable. Exam by Alford Highland. Rankin, MD  (11/20/2008)   Last eye exam report requested.   Eye exam due: 02/2009    Foot exam: yes  (12/11/2008)   Foot exam action/deferral: Do today   High risk foot: No   (12/11/2008)   Foot care education: Done  (12/11/2008)   Foot exam due: 12/11/2009    Urine microalbumin/creatinine ratio: 1158.1  (06/18/2009)   Urine microalbumin action/deferral: Ordered   Urine microalbumin/cr due: 11/24/2007    Diabetes flowsheet reviewed?: Yes   Progress toward A1C goal: At goal  Lipids   Total Cholesterol: 112  (12/13/2008)   LDL: 68  (12/13/2008)   LDL Direct: Not documented   HDL: 29  (12/13/2008)   Triglycerides: 76  (12/13/2008)    SGOT (AST): 14  (04/23/2009)   SGPT (ALT): 15  (04/23/2009) CMP ordered    Alkaline phosphatase: 63  (04/23/2009)   Total bilirubin: 0.3  (04/23/2009)    Lipid flowsheet reviewed?: Yes   Progress toward LDL goal: At goal  Hypertension   Last Blood Pressure: 120 / 63  (07/30/2009)   Serum creatinine: 0.78  (06/26/2009)   Serum potassium 4.3  (06/26/2009) CMP ordered     Hypertension flowsheet reviewed?: Yes   Progress toward BP goal: At goal  Self-Management Support :   Personal Goals (by the next clinic visit) :  Personal A1C goal: 7  (12/11/2008)     Personal blood pressure goal: 130/80  (12/11/2008)     Personal LDL goal: 100  (12/11/2008)    Patient will work on the following items until the next clinic visit to reach self-care goals:     Medications and monitoring: take my medicines every day, check my blood sugar, examine my feet every day  (07/30/2009)     Eating: drink diet soda or water instead of juice or soda, eat more vegetables, use fresh or frozen vegetables, eat foods that are low in salt, eat baked foods instead of fried foods, eat fruit for snacks and desserts  (07/30/2009)     Activity: park at the far end of the parking lot  (07/30/2009)    Diabetes self-management support: Education handout, Resources for patients handout, Written self-care plan  (07/30/2009)   Diabetes care plan printed   Diabetes education handout printed   Last diabetes self-management training by diabetes educator:  10/31/2007    Hypertension self-management support: Education handout, Resources for patients handout, Written self-care plan  (07/30/2009)   Hypertension self-care plan printed.   Hypertension education handout printed    Lipid self-management support: Education handout, Resources for patients handout, Written self-care plan  (07/30/2009)   Lipid self-care plan printed.   Lipid education handout printed      Resource handout printed.   Nursing Instructions: Request report of last diabetic eye exam    Process Orders Check Orders Results:     [facility]: Check successful Tests Sent for requisitioning (July 30, 2009 4:01 PM):     07/31/2009: [facility] -- T-Comprehensive Metabolic Panel [[id number]] (signed)     07/31/2009: [facility] -- T-CK Total [[id number]] (signed)     07/31/2009: [facility] -- T-CBC w/Diff [[id number]] (signed)     07/31/2009: [facility] -- T-Lipid Profile [phone] (signed)

## 2010-05-21 NOTE — Assessment & Plan Note (Signed)
Summary: F/U/VS   Vital Signs:  Patient profile:   65 year old female Height:      64 inches Weight:      272.9 pounds BMI:     47.01 Temp:     97.0 degrees F oral Pulse rate:   77 / minute BP sitting:   158 / 82  (right arm)  Vitals Entered By: Filomena Jungling NT II (June 18, 2009 9:53 AM) CC: 2 MONTH CHECK-UP  WITH COMPLAINT OF SHOULDER AND UPPER BACK Is Patient Diabetic? Yes Did you bring your meter with you today? Yes Pain Assessment Patient in pain? yes     Location: shoulder Intensity: 9 Type: aching Onset of pain  2 WEEKS Nutritional Status BMI of > 30 = obese CBG Result 172  Have you ever been in a relationship where you felt threatened, hurt or afraid?No   Does patient need assistance? Functional Status Self care Ambulation Normal   Primary Care Provider:  Margarito Liner, MD  CC:  2 MONTH CHECK-UP  WITH COMPLAINT OF SHOULDER AND UPPER BACK.  History of Present Illness: Patient returns for follow-up of her diabetes mellitus, hypertension, obstructive sleep apnea, COPD, and other medical problems.  She reports that although her CPAP mask was replaced, she is still having problems with the face mask leaking and causing drying of her eyes; she also reports frequent headaches and feeling tired.  She has had some right shoulder pain for the past two weeks which he thinks may be related to her sleeping position.  Her blood sugars have been doing reasonably well, with 82% of measurements within target range (see glucose meter record).  She reports that she is compliant with her medications.  She does report also that her insurance will not cover Caduet as a combined medication.  Preventive Screening-Counseling & Management  Alcohol-Tobacco     Smoking Status: quit     Year Quit: 2002  Caffeine-Diet-Exercise     Does Patient Exercise: no  Current Medications (verified): 1)  Glucophage 850 Mg Tabs (Metformin Hcl) .... Take 1 Tablet By Mouth Three Times A Day 2)   Nexium 40 Mg  Pack (Esomeprazole Magnesium) .... Take 1 Tablet By Mouth Once A Day 3)  Nasonex 50 Mcg/act Susp (Mometasone Furoate) .... Take 2 Sprays in Each Nostril Once A Day 4)  Aggrenox 25-200 Mg Cp12 (Aspirin-Dipyridamole) .... Take 1 Capsule By Mouth Two Times A Day 5)  Lortab 10 10-500 Mg  Tabs (Hydrocodone-Acetaminophen) .... Take 1 Tablet By Mouth Four Times A Day As Needed For Pain 6)  Diovan 160 Mg Tabs (Valsartan) .... Take 1 Tablet By Mouth Once A Day 7)  Advair Diskus 250-50 Mcg/dose Misc (Fluticasone-Salmeterol) .... Inhale 1 Puff Two Times A Day 8)  Multivitamins  Tabs (Multiple Vitamin) .... Take 1 Tablet By Mouth Once A Day 9)  Bd Insulin Syringe U-100 1 Ml  Misc (Insulin Syringes (Disposable)) 10)  Calcium 600/vitamin D 600-400 Mg-Unit Tabs (Calcium Carbonate-Vitamin D) .... Take 1 Tablet By Mouth Two Times A Day 11)  Accu-Chek Compact Test Drum  Strp (Glucose Blood) .... Use To Test Blood Sugar Before & 2 Hours After Each Meal ( 5-6x Daily) 12)  Ferrous Sulfate 325 (65 Fe) Mg Tabs (Ferrous Sulfate) .... Take 1 Tablet By Mouth Three Times A Day 13)  Lantus Solostar 100 Unit/ml Soln (Insulin Glargine) .... Inject 38 Units Subcutaneously Each Evening 14)  Pen Needles 31g X 8 Mm Misc (Insulin Pen Needle) .... Use  To Inject Insulin Once Daily 15)  Caduet 10-80 Mg Tabs (Amlodipine-Atorvastatin) .... Take 1 Tablet By Mouth Once A Day 16)  Lidoderm 5 % Ptch (Lidocaine) .... Apply One Patch To Right Chest Wall For Pain As Directed, On 12 Hours Off 12 Hours 17)  Carvedilol 12.5 Mg Tabs (Carvedilol) .... Take 1 Tablet By Mouth Two Times A Day 18)  Proair Hfa 108 (90 Base) Mcg/act Aers (Albuterol Sulfate) .... Inhale 2 Puffs Four Times A Day As Needed For Shortness of Breath. 19)  Januvia 50 Mg Tabs (Sitagliptin Phosphate) .... Take 1 Tablet By Mouth Once A Day 20)  Spiriva Handihaler 18 Mcg Caps (Tiotropium Bromide Monohydrate) .... Inhale Contents of 1 Capsule Once A Day 21)   Fexofenadine Hcl 60 Mg Tabs (Fexofenadine Hcl) .... Take 1 Tablet By Mouth Two Times A Day 22)  Hydrochlorothiazide 25 Mg Tabs (Hydrochlorothiazide) .... Take 1 Tablet By Mouth Once A Day  Allergies (verified): No Known Drug Allergies  Review of Systems CV:  Denies chest pain or discomfort and swelling of feet. Resp:  Denies chest discomfort and shortness of breath. GI:  Denies abdominal pain and vomiting; occasional brief nausea. GU:  Denies dysuria. MS:  Denies muscle aches and cramps.  Physical Exam  General:  alert, no distress Lungs:  normal respiratory effort, no accessory muscle use, normal breath sounds, no crackles, and no wheezes.   Heart:  normal rate, regular rhythm, 1/6 systolic ejection murmur; no gallop, and no rub.   Msk:  abduction of left shouilder provokes pain at 120 degrees Extremities:  no edema   Impression & Recommendations:  Problem # 1:  DIABETES MELLITUS, TYPE II (ICD-250.00) Patient's hemoglobin A1c has improved to 7.0 following the increase in her Januvia dose, and she is now at goal.  The plan is to continue current medication regimen and home blood glucose monitoring.  Her updated medication list for this problem includes:    Glucophage 850 Mg Tabs (Metformin hcl) .[name]... Take 1 tablet by mouth three times a day    Diovan 160 Mg Tabs (Valsartan) .[name]... Take 1 tablet by mouth two times a day    Lantus Solostar 100 Unit/ml Soln (Insulin glargine) ..... Inject 38 units subcutaneously each evening    Januvia 50 Mg Tabs (Sitagliptin phosphate) .[name]... Take 1 tablet by mouth once a day  Labs Reviewed: Creat: 0.77 (04/28/2009)     Last Eye Exam: Proliferative diabetic retinopathy OU - stable. Exam by Alford Highland. Rankin, MD (11/20/2008) Reviewed HgBA1c results: 7.0 (06/18/2009)  7.3 (04/23/2009)  Orders: T-Hgb A1C (in-house) (16109UE) T-Urine Microalbumin w/creat. ratio [phone]) T- Capillary Blood Glucose (95621)  Problem # 2:  HYPERTENSION  (ICD-401.9) Patient's blood pressure is above target range.  The plan is to increase Diovan to a dose of 160 mg b.i.d.  Her updated medication list for this problem includes:    Diovan 160 Mg Tabs (Valsartan) .[name]... Take 1 tablet by mouth two times a day    Amlodipine Besylate 10 Mg Tabs (Amlodipine besylate) .[name]... Take 1 tablet by mouth once a day    Carvedilol 12.5 Mg Tabs (Carvedilol) .[name]... Take 1 tablet by mouth two times a day    Hydrochlorothiazide 25 Mg Tabs (Hydrochlorothiazide) .[name]... Take 1 tablet by mouth once a day  BP today: 158/82 Prior BP: 139/80 (04/23/2009)  Prior 10 Yr Risk Heart Disease: 24 % (11/24/2006)  Labs Reviewed: K+: 3.7 (04/28/2009) Creat: : 0.77 (04/28/2009)   Chol: 112 (12/13/2008)   HDL: 29 (12/13/2008)  LDL: 68 (12/13/2008)   TG: 76 (12/13/2008)  Problem # 3:  OBSTRUCTIVE SLEEP APNEA (ICD-327.23) Patient is still having problems with her CPAP mask, despite having had a sleep study with titration and replacement of her old mask.  I asked her to have her respiratory therapist at [facility] call me, since she will be going there today; if I do not hear from him, then the plan will be to contact him and discuss options for her CPAP mask.  Problem # 4:  COPD (ICD-496) The patient's breathing status is stable on her current regimen.  Will continue as before.  Her updated medication list for this problem includes:    Advair Diskus 250-50 Mcg/dose Misc (Fluticasone-salmeterol) ..... Inhale 1 puff two times a day    Proair Hfa 108 (90 Base) Mcg/act Aers (Albuterol sulfate) ..... Inhale 2 puffs four times a day as needed for shortness of breath.    Spiriva Handihaler 18 Mcg Caps (Tiotropium bromide monohydrate) ..... Inhale contents of 1 capsule once a day  Problem # 5:  HYPERCHOLESTEROLEMIA (ICD-272.0) Patient is at goal on current dose of Lipitor, and is doing well.  Her updated medication list for this problem includes:    Lipitor 80 Mg Tabs  (Atorvastatin calcium) .[name]... Take 1 tablet by mouth once a day  Labs Reviewed: SGOT: 14 (04/23/2009)   SGPT: 15 (04/23/2009)  Prior 10 Yr Risk Heart Disease: 24 % (11/24/2006)   HDL:29 (12/13/2008), 30 (05/09/2008)  LDL:68 (12/13/2008), 64 (05/09/2008)  Chol:112 (12/13/2008), 113 (05/09/2008)  Trig:76 (12/13/2008), 95 (05/09/2008)  Problem # 6:  SHOULDER PAIN, RIGHT (ICD-719.41) Patient has intermittent right shoulder pain, which she feels may be related to her sleeping position.  I advised her to let me know if the pain persists or worsens.  For now, will continue symptomatic treatment.  Her updated medication list for this problem includes:    Lortab 10 10-500 Mg Tabs (Hydrocodone-acetaminophen) .[name]... Take 1 tablet by mouth four times a day as needed for pain  Complete Medication List: 1)  Glucophage 850 Mg Tabs (Metformin hcl) .... Take 1 tablet by mouth three times a day 2)  Nexium 40 Mg Pack (Esomeprazole magnesium) .... Take 1 tablet by mouth once a day 3)  Nasonex 50 Mcg/act Susp (Mometasone furoate) .... Take 2 sprays in each nostril once a day 4)  Aggrenox 25-200 Mg Cp12 (Aspirin-dipyridamole) .... Take 1 capsule by mouth two times a day 5)  Lortab 10 10-500 Mg Tabs (Hydrocodone-acetaminophen) .... Take 1 tablet by mouth four times a day as needed for pain 6)  Diovan 160 Mg Tabs (Valsartan) .... Take 1 tablet by mouth two times a day 7)  Advair Diskus 250-50 Mcg/dose Misc (Fluticasone-salmeterol) .... Inhale 1 puff two times a day 8)  Multivitamins Tabs (Multiple vitamin) .... Take 1 tablet by mouth once a day 9)  Bd Insulin Syringe U-100 1 Ml Misc (Insulin syringes (disposable)) 10)  Calcium 600/vitamin D 600-400 Mg-unit Tabs (Calcium carbonate-vitamin d) .... Take 1 tablet by mouth two times a day 11)  Accu-chek Compact Test Drum Strp (Glucose blood) .... Use to test blood sugar before & 2 hours after each meal ( 5-6x daily) 12)  Ferrous Sulfate 325 (65 Fe) Mg Tabs (Ferrous  sulfate) .... Take 1 tablet by mouth three times a day 13)  Lantus Solostar 100 Unit/ml Soln (Insulin glargine) .... Inject 38 units subcutaneously each evening 14)  Pen Needles 31g X 8 Mm Misc (Insulin pen needle) .... Use  to inject insulin once daily 15)  Amlodipine Besylate 10 Mg Tabs (Amlodipine besylate) .... Take 1 tablet by mouth once a day 16)  Lidoderm 5 % Ptch (Lidocaine) .... Apply one patch to right chest wall for pain as directed, on 12 hours off 12 hours 17)  Carvedilol 12.5 Mg Tabs (Carvedilol) .... Take 1 tablet by mouth two times a day 18)  Proair Hfa 108 (90 Base) Mcg/act Aers (Albuterol sulfate) .... Inhale 2 puffs four times a day as needed for shortness of breath. 19)  Januvia 50 Mg Tabs (Sitagliptin phosphate) .... Take 1 tablet by mouth once a day 20)  Spiriva Handihaler 18 Mcg Caps (Tiotropium bromide monohydrate) .... Inhale contents of 1 capsule once a day 21)  Fexofenadine Hcl 60 Mg Tabs (Fexofenadine hcl) .... Take 1 tablet by mouth two times a day 22)  Hydrochlorothiazide 25 Mg Tabs (Hydrochlorothiazide) .... Take 1 tablet by mouth once a day 23)  Lipitor 80 Mg Tabs (Atorvastatin calcium) .... Take 1 tablet by mouth once a day  Other Orders: Gynecologic Referral (Gyn)  Patient Instructions: 1)  Please schedule a follow-up appointment in 6 weeks. 2)  Increase Diovan 160 mg to a dose of 1 tablet two times a day. 3)  Stop Caduet. 4)  Start amlodipine 10 mg one tablet daily. 5)  Start Lipitor 80mg  one tablet daily.  6)  Please keep appointment for Pap smear.  Prescriptions: DIOVAN 160 MG TABS (VALSARTAN) Take 1 tablet by mouth two times a day  #62 x 6   Entered and Authorized by:   Margarito Liner MD   Signed by:   Margarito Liner MD on 06/18/2009   Method used:   Faxed to ...       [facility] (retail)       [address]       [place], [place]  [address]       Ph: [phone]       Fax: [phone]   RxID:    [id number] AMLODIPINE BESYLATE 10 MG TABS (AMLODIPINE BESYLATE) Take 1 tablet by mouth once a day  #31 x 6   Entered and Authorized by:   Margarito Liner MD   Signed by:   Margarito Liner MD on 06/18/2009   Method used:   Faxed to ...       [facility] (retail)       [address]       [place], [place]  [address]       Ph: [phone]       Fax: [phone]   RxID:   [id number] LIPITOR 80 MG TABS (ATORVASTATIN CALCIUM) Take 1 tablet by mouth once a day  #31 x 6   Entered and Authorized by:   Margarito Liner MD   Signed by:   Margarito Liner MD on 06/18/2009   Method used:   Faxed to ...       [facility] (retail)       [address]       [place], [place]  [address]       Ph: [phone]       Fax: [phone]   RxID:   [id number]   Prevention & Chronic Care Immunizations   Influenza vaccine: Fluvax 3+  (02/12/2009)   Influenza vaccine due: 12/18/2009  Tetanus booster: 02/12/2009: Tdap   Tetanus booster due: 02/13/2019    Pneumococcal vaccine: Pneumovax  (02/01/2001)   Pneumococcal vaccine due: 09/10/2012    H. zoster vaccine: Not documented  Colorectal Screening   Hemoccult: Negative X3  (05/01/2009)   Hemoccult action/deferral: Ordered  (04/23/2009)   Hemoccult due: 12/19/2008    Colonoscopy: Normal  (02/13/2004)   Colonoscopy due: 02/12/2014  Other Screening   Pap smear: Normal  (10/13/2001)   Pap smear action/deferral: GYN Referral  (06/18/2009)   Pap smear due: 10/14/2002    Mammogram: Assessment: BIRADS 1: Negative   Location: [facility]   (01/08/2009)   Mammogram due: 01/2010    DXA bone density scan: AP Spine L1-L4:  T-score -1.4 (Osteopenia) Dual Femur Neck Left:  T-score -0.6 (Normal) Dual Femur Neck Right: T-score -1.0 (Normal)    (10/04/2007)   DXA scan due: None    Smoking status: quit  (06/18/2009)  Diabetes Mellitus   HgbA1C: 7.0  (06/18/2009)   HgbA1C  action/deferral: Ordered  (12/11/2008)   Hemoglobin A1C due: 05/27/2007    Eye exam: Proliferative diabetic retinopathy OU - stable. Exam by Alford Highland. Rankin, MD  (11/20/2008)   Eye exam due: 02/2009    Foot exam: yes  (12/11/2008)   Foot exam action/deferral: Do today   High risk foot: No  (12/11/2008)   Foot care education: Done  (12/11/2008)   Foot exam due: 12/11/2009    Urine microalbumin/creatinine ratio: 31.5  (05/09/2008)   Urine microalbumin action/deferral: Ordered   Urine microalbumin/cr due: 11/24/2007    Diabetes flowsheet reviewed?: Yes   Progress toward A1C goal: At goal   Diabetes comments: Will see eye doctor this Friday  Lipids   Total Cholesterol: 112  (12/13/2008)   LDL: 68  (12/13/2008)   LDL Direct: Not documented   HDL: 29  (12/13/2008)   Triglycerides: 76  (12/13/2008)    SGOT (AST): 14  (04/23/2009)   SGPT (ALT): 15  (04/23/2009)   Alkaline phosphatase: 63  (04/23/2009)   Total bilirubin: 0.3  (04/23/2009)    Lipid flowsheet reviewed?: Yes   Progress toward LDL goal: At goal  Hypertension   Last Blood Pressure: 158 / 82  (06/18/2009)   Serum creatinine: 0.77  (04/28/2009)   Serum potassium 3.7  (04/28/2009)    Hypertension flowsheet reviewed?: Yes   Progress toward BP goal: Deteriorated  Self-Management Support :   Personal Goals (by the next clinic visit) :     Personal A1C goal: 7  (12/11/2008)     Personal blood pressure goal: 130/80  (12/11/2008)     Personal LDL goal: 100  (12/11/2008)    Patient will work on the following items until the next clinic visit to reach self-care goals:     Medications and monitoring: take my medicines every day, check my blood sugar, examine my feet every day  (06/18/2009)     Eating: drink diet soda or water instead of juice or soda, eat more vegetables, use fresh or frozen vegetables, eat foods that are low in salt, eat baked foods instead of fried foods, eat fruit for snacks and desserts  (06/18/2009)      Activity: park at the far end of the parking lot  (06/18/2009)    Diabetes self-management support: Copy of home glucose meter record, Resources for patients handout, Written self-care plan  (06/18/2009)   Diabetes care plan printed   Last diabetes self-management training by diabetes educator: 10/31/2007    Hypertension self-management support:  Copy of home glucose meter record, Resources for patients handout, Written self-care plan  (06/18/2009)   Hypertension self-care plan printed.    Lipid self-management support: Copy of home glucose meter record, Resources for patients handout, Written self-care plan  (06/18/2009)   Lipid self-care plan printed.      Resource handout printed.   Nursing Instructions: Gyn referral for screening Pap (see order)    Process Orders Check Orders Results:     [facility]: Check successful Tests Sent for requisitioning (June 20, 2009 6:53 PM):     06/18/2009: [facility] -- T-Urine Microalbumin w/creat. ratio [[id number]] (signed)    Laboratory Results   Blood Tests   Date/Time Received: June 18, 2009 10:21 AM  Date/Time Reported: Burke Keels  June 18, 2009 10:21 AM   HGBA1C: 7.0%   (Normal Range: Non-Diabetic - 3-6%   Control Diabetic - 6-8%) CBG Random:: 172mg /dL

## 2010-05-21 NOTE — Miscellaneous (Signed)
Summary: [facility]:  Sleep Therapy  [facility]:  Sleep Therapy   Imported By: Florinda Marker 07/01/2009 10:33:27  _____________________________________________________________________  External Attachment:    Type:   Image     Comment:   External Document

## 2010-05-21 NOTE — Assessment & Plan Note (Signed)
Summary: ARM/SHOULDER PAIN,POSSIBLE INJECTION,MC   Vital Signs:  Patient profile:   65 year old female Height:      64 inches Weight:      250 pounds BP sitting:   145 / 78  Vitals Entered By: Rochele Pages RN (March 03, 2010 1:49 PM)  Primary Harun Brumley:  Margarito Liner, MD   History of Present Illness: 65yo R-hand dominant female to office for f/u of R shoulder pain. 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 Pt states symptoms overall improved compared to when they initially started, but still having pain with overhead activities & night-time pain. She feels her ROM has improved. She is taking tramadol & occasional hydrocodone which helps some. She has completed physical therapy, but has not been diligent with HEP. She denies any numbness/tingling of upper extremeties. She has been considering shoulder injection for some time, but has been afraid.  Is interested in this today. She is a diabetic - AM BGs 80-100, HS BGs 160-180.  Allergies: No Known Drug Allergies PMH-FH-SH reviewed for relevance  Review of Systems      See HPI  Physical Exam  General:  Obese, in no acute distress; alert,appropriate and cooperative throughout examination Msk:  C-SPINE: normal ROM without pain, no midline or paraspinal tenderness.  Neg Spurlings b/l  SHOULDERS: - R shoulder:  No deformity or atrophy. TTP over [facility] joint & mildly TTP over bicipital groove. Decreased ROM - flexion 130 active/160 passive - with some pain; abduction 120 active/160 passive - with pain; ER 30 without pain; IR L5 with minimal pain. (+)Hawkins, (+)Neers, (+)Empty can (-)Speeds, (-) Obriens RTC strength +4/5 with abduction & ER, otherwise +5/5 - L shoulder: full ROM without pain, tenderness, weakness. Pulses:  +2/4 radial b/l Neurologic:  sensation intact to light touch.   DTR +2/4 bicep, tricep,  brachioradialis b/l   Impression & Recommendations:  Problem # 1:  SHOULDER PAIN, RIGHT (ICD-719.41)  - Underwent subacromial steroid injection in office today: PROCEDURE NOTE Consent obtained and verified. Skin cleansed with alcohol. Topical analgesic spray: Ethyl chloride. Joint: R shoulder subacromial injection Approached in typical fashion with: posterior subacromial approach Completed without difficulty Meds: 4cc Lidocaine 1% + 1cc Kenalog 40mg /cc (total vol = 5cc) Needle: 25g 1.5 inch Aftercare instructions and Red flags advised. Tolerated procedure well.  Felt immediate improvement while in office.  Noted to have improved ROM after injection.  Counseled that injection may raise BGs over the next several days, should continue to monitor closely & call with any questions/concerns. - Emphasized need to start doing HEP 2-3 times daily.  Given new handout & reviewed exercises. - Cont. tramadol as needed - f/u 6-weeks for re-evaluation.   Her updated medication list for this problem includes:    Tramadol Hcl 50 Mg Tabs (Tramadol hcl) .[name]... 1 - 2  tabs by mouth q 6-8 hrs as needed for severe pain    Hydrocodone-acetaminophen 5-325 Mg Tabs (Hydrocodone-acetaminophen) .[name]... Take 1-2  tablets by mouth four times a day as needed for pain  Orders: Joint Aspirate / Injection, Large (20610) Kenalog 10 mg inj (J3301)  Problem # 2:  ROTATOR CUFF SYNDROME, RIGHT (ICD-726.10)  - Subacromial injection done today as stated above - Emphasized regular HEP to help condition - remainder of plan as stated above  Orders: Joint Aspirate / Injection, Large (20610) Kenalog 10 mg inj (J3301)  Complete  Medication List: 1)  Glucophage 850 Mg Tabs (Metformin hcl) .... Take 1 tablet by mouth three times a day 2)  Nasonex 50 Mcg/act Susp (Mometasone furoate) .... Take 2 sprays in each nostril once a day 3)  Aggrenox 25-200 Mg Cp12 (Aspirin-dipyridamole) .... Take 1 capsule by mouth two times a  day 4)  Diovan 160 Mg Tabs (Valsartan) .... Take 1 tablet by mouth two times a day 5)  Advair Diskus 250-50 Mcg/dose Misc (Fluticasone-salmeterol) .... Inhale 1 puff two times a day 6)  Multivitamins Tabs (Multiple vitamin) .... Take 1 tablet by mouth once a day 7)  Bd Insulin Syringe U-100 1 Ml Misc (Insulin syringes (disposable)) 8)  Calcium 600/vitamin D 600-400 Mg-unit Tabs (Calcium carbonate-vitamin d) .... Take 1 tablet by mouth two times a day 9)  Accu-chek Compact Test Drum Strp (Glucose blood) .... Use to test blood sugar before & 2 hours after each meal ( 5-6x daily) 10)  Lantus Solostar 100 Unit/ml Soln (Insulin glargine) .... Inject 38 units subcutaneously each evening 11)  Pen Needles 31g X 8 Mm Misc (Insulin pen needle) .... Use to inject insulin once daily 12)  Amlodipine Besylate 10 Mg Tabs (Amlodipine besylate) .... Take 1 tablet by mouth once a day 13)  Lidoderm 5 % Ptch (Lidocaine) .... Apply one patch to right chest wall for pain as directed, on 12 hours off 12 hours 14)  Carvedilol 12.5 Mg Tabs (Carvedilol) .... Take 1and 1/2 tablets by mouth two times a day 15)  Proair Hfa 108 (90 Base) Mcg/act Aers (Albuterol sulfate) .... Inhale 2 puffs four times a day as needed for shortness of breath. 16)  Januvia 50 Mg Tabs (Sitagliptin phosphate) .... Take 1 tablet by mouth once a day 17)  Spiriva Handihaler 18 Mcg Caps (Tiotropium bromide monohydrate) .... Inhale contents of 1 capsule once a day 18)  Fexofenadine Hcl 180 Mg Tabs (Fexofenadine hcl) .... Two times a day 19)  Hydrochlorothiazide 25 Mg Tabs (Hydrochlorothiazide) .... Take 1 tablet by mouth once a day 20)  Tramadol Hcl 50 Mg Tabs (Tramadol hcl) .[name].. 1 - 2  tabs by mouth q 6-8 hrs as needed for severe pain 21)  Dexilant 60 Mg Cpdr (Dexlansoprazole) .... Take as directed 1 capsule each morning 15 minutes before breakfast 22)  Crestor 5 Mg Tabs (Rosuvastatin calcium) .... Take 1 tablet by mouth once a day 23)  Silenor 3 Mg  Tabs (Doxepin hcl) .... Take 1 tablet by mouth at bedtime as needed for insomnia 24)  Hydrocodone-acetaminophen 5-325 Mg Tabs (Hydrocodone-acetaminophen) .... Take 1-2  tablets by mouth four times a day as needed for pain   Orders Added: 1)  Est. Patient Level IV [40981] 2)  Joint Aspirate / Injection, Large [20610] 3)  Kenalog 10 mg inj [J3301]

## 2010-05-21 NOTE — Assessment & Plan Note (Signed)
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 Overall her condition has improved wrt ROM and pain. Less night-time pain. Less pain on overhead motions. No consistent home rehabilitation routine though she recevied home rehab sheet from PT. Taking ibuprofen with tramadol for pain relief. No adverse medication effects.  Allergies: No Known Drug Allergies  Physical Exam  General:  Well-developed,well-nourished,in no acute distress; alert,appropriate and cooperative throughout examination Msk:  Abd- 120 with significantly less pain at 90 deg. Flex- 130 with significantly less pain at 90 deg. ER- 30 w/o pain. IR - Just above sacrum with some dyscomfort No ttp.   Impression & Recommendations:  Problem # 1:  ROTATOR CUFF SYNDROME, RIGHT (ICD-726.10) Assessment Improved  - Will increase dose of tramadol to 100mg  by mouth q 6-8hrs as needed for severe pain; #240. Sedation precautions. - Avoid NSAIDs such as ibuprofen and naproxen. - Emphasized significance of routine home rehab exercises. Will start home theraband regimen with exercise sheet provided during this encounter. Exercises interactively demonstrated to the patient during this encounter. - RTC in 6 weeks. If no significant improvement, then will re-consider performance of corticosteroid injection. Patient encouraged to keep a BG log as she cannot provide a avg BG level.  Complete Medication List: 1)  Glucophage 850 Mg Tabs (Metformin hcl) .... Take 1 tablet by mouth three times a day 2)  Nasonex 50 Mcg/act Susp (Mometasone furoate) .... Take 2 sprays in each nostril once a day 3)  Aggrenox 25-200 Mg Cp12 (Aspirin-dipyridamole) .... Take 1 capsule by mouth two times a  day 4)  Diovan 160 Mg Tabs (Valsartan) .... Take 1 tablet by mouth two times a day 5)  Advair Diskus 250-50 Mcg/dose Misc (Fluticasone-salmeterol) .... Inhale 1 puff two times a day 6)  Multivitamins Tabs (Multiple vitamin) .... Take 1 tablet by mouth once a day 7)  Bd Insulin Syringe U-100 1 Ml Misc (Insulin syringes (disposable)) 8)  Calcium 600/vitamin D 600-400 Mg-unit Tabs (Calcium carbonate-vitamin d) .... Take 1 tablet by mouth two times a day 9)  Accu-chek Compact Test Drum Strp (Glucose blood) .... Use to test blood sugar before & 2 hours after each meal ( 5-6x daily) 10)  Lantus Solostar 100 Unit/ml Soln (Insulin glargine) .... Inject 38 units subcutaneously each evening 11)  Pen Needles 31g X 8 Mm Misc (Insulin pen needle) .... Use to inject insulin once daily 12)  Amlodipine Besylate 10 Mg Tabs (Amlodipine besylate) .... Take 1 tablet by mouth once a day 13)  Lidoderm 5 % Ptch (Lidocaine) .... Apply one patch to right chest wall for pain as directed, on 12 hours off 12 hours 14)  Carvedilol 12.5 Mg Tabs (Carvedilol) .... Take 1and 1/2 tablets by mouth two times a day 15)  Proair Hfa 108 (90 Base) Mcg/act Aers (Albuterol sulfate) .... Inhale 2 puffs four times a day as needed for shortness of breath. 16)  Januvia 50 Mg Tabs (Sitagliptin phosphate) .... Take 1 tablet by mouth once a day 17)  Spiriva Handihaler 18 Mcg Caps (Tiotropium bromide monohydrate) .... Inhale contents of 1 capsule once a day 18)  Fexofenadine Hcl 60 Mg Tabs (Fexofenadine hcl) .... Take 1 tablet by mouth two times a day  19)  Hydrochlorothiazide 25 Mg Tabs (Hydrochlorothiazide) .... Take 1 tablet by mouth once a day 20)  Tramadol Hcl 50 Mg Tabs (Tramadol hcl) .[name].. 1 - 2  tabs by mouth q 6-8 hrs as needed for severe pain 21)  Dexilant 60 Mg Cpdr (Dexlansoprazole) .... Take as directed 1 capsule each morning 15 minutes before breakfast 22)  Crestor 5 Mg Tabs (Rosuvastatin calcium) .... Take 1 tablet by mouth once a  day 23)  Silenor 3 Mg Tabs (Doxepin hcl) .... Take 1 tablet by mouth at bedtime as needed for insomnia Prescriptions: TRAMADOL HCL 50 MG TABS (TRAMADOL HCL) 1 - 2  tabs by mouth q 6-8 hrs as needed for severe pain  #240 x 0   Entered and Authorized by:   Valarie Merino MD   Signed by:   Valarie Merino MD on 10/08/2009   Method used:   Print then Give to Patient   RxID:   [id number]

## 2010-05-21 NOTE — Miscellaneous (Signed)
[facility]   Imported By: Margie Billet 08/12/2009 12:18:53  _____________________________________________________________________  External Attachment:    Type:   Image     Comment:   External Document

## 2010-05-21 NOTE — Assessment & Plan Note (Signed)
Summary: EST-ROUTINE CHECKUP/CH   Vital Signs:  Patient profile:   65 year old female Height:      64 inches Weight:      269.7 pounds BMI:     46.46 Temp:     97.7 degrees F oral Pulse rate:   87 / minute Pulse (ortho):   86 / minute BP sitting:   147 / 81  (right arm)  Vitals Entered By: Filomena Jungling NT II (December 03, 2009 4:02 PM)  Serial Vital Signs/Assessments:  Time      Position  BP       Pulse  Resp  Temp     By 4:04 PM   Lying RA  140/70   86                    Mamie Hague NT II 4:04 PM   Sitting   140/70   86                    Mamie Hague NT II 4:04 PM   Standing  130/64   86                    Mamie Hague NT II  CC: BREATHING HARD,? INHALERS NOT SEEM TOBE WORKKING, cough, is there something else you can give me for pain Is Patient Diabetic? Yes Did you bring your meter with you today? Yes Pain Assessment Patient in pain? yes     Location: arms,  Intensity: 7 Type: aching Onset of pain  Chronic Nutritional Status BMI of > 30 = obese CBG Result 163  Have you ever been in a relationship where you felt threatened, hurt or afraid?No   Does patient need assistance? Functional Status Self care Ambulation Normal   Primary Care Provider:  Margarito Liner, MD  CC:  BREATHING HARD, ? INHALERS NOT SEEM TOBE WORKKING, cough, and is there something else you can give me for pain.  History of Present Illness: Patient returns for followup of her diabetes mellitus, COPD, right shoulder pain, and other chronic medical problems. Her main complaint today is exertional shortness of breath which has been worse over the past few weeks despite regular use of her inhaled bronchodilators. She also reports that her right shoulder pain is unrelieved by tramadol, and she requested a stronger pain medication.  She reports that she is compliant with her medications.  Preventive Screening-Counseling & Management  Alcohol-Tobacco     Smoking Status: quit     Year Quit:  2002  Caffeine-Diet-Exercise     Does Patient Exercise: no  Current Medications (verified): 1)  Glucophage 850 Mg Tabs (Metformin Hcl) .... Take 1 Tablet By Mouth Three Times A Day 2)  Nasonex 50 Mcg/act Susp (Mometasone Furoate) .... Take 2 Sprays in Each Nostril Once A Day 3)  Aggrenox 25-200 Mg Cp12 (Aspirin-Dipyridamole) .... Take 1 Capsule By Mouth Two Times A Day 4)  Diovan 160 Mg Tabs (Valsartan) .... Take 1 Tablet By Mouth Two Times A Day 5)  Advair Diskus 250-50 Mcg/dose Misc (Fluticasone-Salmeterol) .... Inhale 1 Puff Two Times A Day 6)  Multivitamins  Tabs (Multiple Vitamin) .... Take 1 Tablet By Mouth Once A Day 7)  Bd Insulin Syringe U-100 1 Ml  Misc (Insulin Syringes (Disposable)) 8)  Calcium 600/vitamin D 600-400 Mg-Unit Tabs (Calcium Carbonate-Vitamin D) .... Take 1 Tablet By Mouth Two Times A Day 9)  Accu-Chek Compact Test Drum  Strp (Glucose Blood) .... Use  To Test Blood Sugar Before & 2 Hours After Each Meal ( 5-6x Daily) 10)  Lantus Solostar 100 Unit/ml Soln (Insulin Glargine) .... Inject 38 Units Subcutaneously Each Evening 11)  Pen Needles 31g X 8 Mm Misc (Insulin Pen Needle) .... Use To Inject Insulin Once Daily 12)  Amlodipine Besylate 10 Mg Tabs (Amlodipine Besylate) .... Take 1 Tablet By Mouth Once A Day 13)  Lidoderm 5 % Ptch (Lidocaine) .... Apply One Patch To Right Chest Wall For Pain As Directed, On 12 Hours Off 12 Hours 14)  Carvedilol 12.5 Mg Tabs (Carvedilol) .... Take 1and 1/2 Tablets By Mouth Two Times A Day 15)  Proair Hfa 108 (90 Base) Mcg/act Aers (Albuterol Sulfate) .... Inhale 2 Puffs Four Times A Day As Needed For Shortness of Breath. 16)  Januvia 50 Mg Tabs (Sitagliptin Phosphate) .... Take 1 Tablet By Mouth Once A Day 17)  Spiriva Handihaler 18 Mcg Caps (Tiotropium Bromide Monohydrate) .... Inhale Contents of 1 Capsule Once A Day 18)  Fexofenadine Hcl 180 Mg Tabs (Fexofenadine Hcl) .... Two Times A Day 19)  Hydrochlorothiazide 25 Mg Tabs  (Hydrochlorothiazide) .... Take 1 Tablet By Mouth Once A Day 20)  Tramadol Hcl 50 Mg Tabs (Tramadol Hcl) .[name].. 1 - 2  Tabs By Mouth Q 6-8 Hrs As Needed For Severe Pain 21)  Dexilant 60 Mg Cpdr (Dexlansoprazole) .... Take As Directed 1 Capsule Each Morning 15 Minutes Before Breakfast 22)  Crestor 5 Mg Tabs (Rosuvastatin Calcium) .... Take 1 Tablet By Mouth Once A Day 23)  Silenor 3 Mg Tabs (Doxepin Hcl) .... Take 1 Tablet By Mouth At Bedtime As Needed For Insomnia  Allergies (verified): No Known Drug Allergies  Review of Systems CV:  Denies chest pain or discomfort and swelling of feet. Resp:  increased shortness of breath with exertion. GI:  Denies abdominal pain, nausea, and vomiting. GU:  Denies dysuria. MS:  Denies muscle aches and cramps.  Physical Exam  General:  alert, no distress Lungs:  normal respiratory effort, normal breath sounds, no crackles, and no wheezes.   Heart:  normal rate, regular rhythm, 1/6 systolic ejection murmur; no gallop, and no rub.   Abdomen:  soft, non-tender, and normal bowel sounds.   Extremities:  no edema   Impression & Recommendations:  Problem # 1:  COPD (ICD-496) Patient reports increased cough and exertional dyspnea which likely represent an exacerbation of her COPD. Her oxygen saturation in clinic today was 98% at rest and dropped to 95% with ambulation. The plan is to continue her inhaled bronchodilators, and will also treat with doxycycline 100 mg b.i.d. for 7 days. I advised her to call or return if her symptoms worsen.  Her updated medication list for this problem includes:    Advair Diskus 250-50 Mcg/dose Misc (Fluticasone-salmeterol) ..... Inhale 1 puff two times a day    Proair Hfa 108 (90 Base) Mcg/act Aers (Albuterol sulfate) ..... Inhale 2 puffs four times a day as needed for shortness of breath.    Spiriva Handihaler 18 Mcg Caps (Tiotropium bromide monohydrate) ..... Inhale contents of 1 capsule once a day  Orders: CXR- 2view  (CXR)  Problem # 2:  SHOULDER PAIN, RIGHT (ICD-719.41) Patient shoulder pain is being managed at the sports medicine clinic. She reports that the pain is not relieved by Tylenol. She will followup at sports medicine for consideration of a steroid injection, which I think may indeed help her pain. The plan is to add hydrocodone/acetaminophen as a p.r.n. analgesic for  pain that is unrelieved by Tylenol.  Her updated medication list for this problem includes:    Tramadol Hcl 50 Mg Tabs (Tramadol hcl) .[name]... 1 - 2  tabs by mouth q 6-8 hrs as needed for severe pain    Hydrocodone-acetaminophen 5-325 Mg Tabs (Hydrocodone-acetaminophen) .[name]... Take 1-2  tablets by mouth four times a day as needed for pain  Problem # 3:  DIABETES MELLITUS, TYPE II (ICD-250.00) Patient's diabetes mellitus is well controlled on current regimen.  Plan is to continue current medications, and continue home capillary blood glucose monitoring.   Her updated medication list for this problem includes:    Glucophage 850 Mg Tabs (Metformin hcl) .[name]... Take 1 tablet by mouth three times a day    Diovan 160 Mg Tabs (Valsartan) .[name]... Take 1 tablet by mouth two times a day    Lantus Solostar 100 Unit/ml Soln (Insulin glargine) ..... Inject 38 units subcutaneously each evening    Januvia 50 Mg Tabs (Sitagliptin phosphate) .[name]... Take 1 tablet by mouth once a day  Labs Reviewed: Creat: 0.74 (07/31/2009)     Last Eye Exam: Proliferative diabetic retinopathy OU - stable. Exam by Alford Highland. Rankin, MD (11/20/2008) Reviewed HgBA1c results: 6.9 (12/03/2009)  6.8 (09/17/2009)  Problem # 4:  HYPERTENSION (ICD-401.9) Patient's blood pressure is mildly elevated today. Will continue current regimen and recheck upon return.  Her updated medication list for this problem includes:    Diovan 160 Mg Tabs (Valsartan) .[name]... Take 1 tablet by mouth two times a day    Amlodipine Besylate 10 Mg Tabs (Amlodipine besylate) .[name]... Take 1 tablet by mouth once  a day    Carvedilol 12.5 Mg Tabs (Carvedilol) .[name]... Take 1and 1/2 tablets by mouth two times a day    Hydrochlorothiazide 25 Mg Tabs (Hydrochlorothiazide) .[name]... Take 1 tablet by mouth once a day  BP today: 147/81 Prior BP: 126/70 (10/23/2009)  Prior 10 Yr Risk Heart Disease: 24 % (11/24/2006)  Labs Reviewed: K+: 4.5 (07/31/2009) Creat: : 0.74 (07/31/2009)   Chol: 118 (07/31/2009)   HDL: 27 (07/31/2009)   LDL: 74 (07/31/2009)   TG: 86 (07/31/2009)  Problem # 5:  HYPERCHOLESTEROLEMIA (ICD-272.0) Patient is doing well on current dose of Crestor.  Her updated medication list for this problem includes:    Crestor 5 Mg Tabs (Rosuvastatin calcium) .[name]... Take 1 tablet by mouth once a day  Labs Reviewed: SGOT: 14 (07/31/2009)   SGPT: 17 (07/31/2009)  Prior 10 Yr Risk Heart Disease: 24 % (11/24/2006)   HDL:27 (07/31/2009), 29 (12/13/2008)  LDL:74 (07/31/2009), 68 (12/13/2008)  Chol:118 (07/31/2009), 112 (12/13/2008)  Trig:86 (07/31/2009), 76 (12/13/2008)  Complete Medication List: 1)  Glucophage 850 Mg Tabs (Metformin hcl) .... Take 1 tablet by mouth three times a day 2)  Nasonex 50 Mcg/act Susp (Mometasone furoate) .... Take 2 sprays in each nostril once a day 3)  Aggrenox 25-200 Mg Cp12 (Aspirin-dipyridamole) .... Take 1 capsule by mouth two times a day 4)  Diovan 160 Mg Tabs (Valsartan) .... Take 1 tablet by mouth two times a day 5)  Advair Diskus 250-50 Mcg/dose Misc (Fluticasone-salmeterol) .... Inhale 1 puff two times a day 6)  Multivitamins Tabs (Multiple vitamin) .... Take 1 tablet by mouth once a day 7)  Bd Insulin Syringe U-100 1 Ml Misc (Insulin syringes (disposable)) 8)  Calcium 600/vitamin D 600-400 Mg-unit Tabs (Calcium carbonate-vitamin d) .... Take 1 tablet by mouth two times a day 9)  Accu-chek Compact Test Drum Strp (Glucose blood) .... Use  to test blood sugar before & 2 hours after each meal ( 5-6x daily) 10)  Lantus Solostar 100 Unit/ml Soln (Insulin glargine) .... Inject  38 units subcutaneously each evening 11)  Pen Needles 31g X 8 Mm Misc (Insulin pen needle) .... Use to inject insulin once daily 12)  Amlodipine Besylate 10 Mg Tabs (Amlodipine besylate) .... Take 1 tablet by mouth once a day 13)  Lidoderm 5 % Ptch (Lidocaine) .... Apply one patch to right chest wall for pain as directed, on 12 hours off 12 hours 14)  Carvedilol 12.5 Mg Tabs (Carvedilol) .... Take 1and 1/2 tablets by mouth two times a day 15)  Proair Hfa 108 (90 Base) Mcg/act Aers (Albuterol sulfate) .... Inhale 2 puffs four times a day as needed for shortness of breath. 16)  Januvia 50 Mg Tabs (Sitagliptin phosphate) .... Take 1 tablet by mouth once a day 17)  Spiriva Handihaler 18 Mcg Caps (Tiotropium bromide monohydrate) .... Inhale contents of 1 capsule once a day 18)  Fexofenadine Hcl 180 Mg Tabs (Fexofenadine hcl) .... Two times a day 19)  Hydrochlorothiazide 25 Mg Tabs (Hydrochlorothiazide) .... Take 1 tablet by mouth once a day 20)  Tramadol Hcl 50 Mg Tabs (Tramadol hcl) .[name].. 1 - 2  tabs by mouth q 6-8 hrs as needed for severe pain 21)  Dexilant 60 Mg Cpdr (Dexlansoprazole) .... Take as directed 1 capsule each morning 15 minutes before breakfast 22)  Crestor 5 Mg Tabs (Rosuvastatin calcium) .... Take 1 tablet by mouth once a day 23)  Silenor 3 Mg Tabs (Doxepin hcl) .... Take 1 tablet by mouth at bedtime as needed for insomnia 24)  Hydrocodone-acetaminophen 5-325 Mg Tabs (Hydrocodone-acetaminophen) .... Take 1-2  tablets by mouth four times a day as needed for pain 25)  Doxycycline Hyclate 100 Mg Caps (Doxycycline hyclate) .... Take 1 capsule by mouth two times a day for 7 days  Other Orders: T- Capillary Blood Glucose ([id number]) T-Hgb A1C (in-house) ([id number]) T-Urine Microalbumin w/creat. ratio [id number]) T-Comprehensive Metabolic Panel [id number]) T-CBC w/Diff ([id number]) T-BNP  (B Natriuretic Peptide) ([id number])  Patient Instructions: 1)  Please schedule a follow-up  appointment in 1 month. 2)  Take doxycycline 100 mg two times a day for 7 days. 3)  Start hydrocodone-acetaminophen 5-325 one to two  tablets by mouth four times a day as needed for pain not relieved by tramadol. 4)  Please follow-up at the [facility]. 5)  Call if symptoms worsen. Prescriptions: HYDROCODONE-ACETAMINOPHEN 5-325 MG TABS (HYDROCODONE-ACETAMINOPHEN) Take 1-2  tablets by mouth four times a day as needed for pain  #60 x 1   Entered and Authorized by:   Margarito Liner MD   Signed by:   Margarito Liner MD on 12/03/2009   Method used:   Reprint   RxID:   [id number] DOXYCYCLINE HYCLATE 100 MG CAPS (DOXYCYCLINE HYCLATE) Take 1 capsule by mouth two times a day for 7 days  #14 x 0   Entered and Authorized by:   Margarito Liner MD   Signed by:   Margarito Liner MD on 12/03/2009   Method used:   Print then Give to Patient   RxID:   [id number] HYDROCODONE-ACETAMINOPHEN 5-325 MG TABS (HYDROCODONE-ACETAMINOPHEN) Take 1-2  tablets by mouth four times a day as needed for pain  #60 x 1   Entered and Authorized by:   Margarito Liner MD   Signed by:   Margarito Liner MD on 12/03/2009   Method used:   Print then Give to Patient  RxID:   [id number]     Process Orders Check Orders Results:     [facility]: Check successful Tests Sent for requisitioning (December 05, 2009 5:15 PM):     12/03/2009: [facility] -- T-Urine Microalbumin w/creat. ratio [[id number]] (signed)     12/03/2009: [facility] -- T-Comprehensive Metabolic Panel [[id number]] (signed)     12/03/2009: [facility] -- T-CBC w/Diff [[id number]] (signed)     12/03/2009: [facility] -- T-BNP  (B Natriuretic Peptide) [phone] (signed)     Prevention & Chronic Care Immunizations   Influenza vaccine: Fluvax 3+  (02/12/2009)   Influenza vaccine due: 12/18/2009    Tetanus booster: 02/12/2009: Tdap   Tetanus booster due:  02/13/2019    Pneumococcal vaccine: Pneumovax  (02/01/2001)   Pneumococcal vaccine due: 09/10/2012    H. zoster vaccine: Not documented  Colorectal Screening   Hemoccult: Negative X3  (05/01/2009)   Hemoccult action/deferral: Ordered  (04/23/2009)   Hemoccult due: 12/19/2008    Colonoscopy: Normal  (02/13/2004)   Colonoscopy due: 02/12/2014  Other Screening   Pap smear: STATEMENT Of SPECIMEN ADEQUACY: Satisfactory for evaluation Endocervical/Transformation zone component PRESENT. INTERPRETATION: NEGATIVE FOR INTRAEPITHELIAL LESIONS OR MALIGNANCY.  (07/30/2009)   Pap smear action/deferral: GYN Referral  (06/18/2009)   Pap smear due: 07/2010    Mammogram: Assessment: BIRADS 1: Negative   Location: [facility]   (01/08/2009)   Mammogram due: 01/2010    DXA bone density scan: AP Spine L1-L4:  T-score -1.4 (Osteopenia) Dual Femur Neck Left:  T-score -0.6 (Normal) Dual Femur Neck Right: T-score -1.0 (Normal)    (10/04/2007)   DXA scan due: None    Smoking status: quit  (12/03/2009)  Diabetes Mellitus   HgbA1C: 6.9  (12/03/2009)   HgbA1C action/deferral: Ordered  (12/11/2008)   Hemoglobin A1C due: 05/27/2007    Eye exam: Proliferative diabetic retinopathy OU - stable. Exam by Alford Highland. Rankin, MD  (11/20/2008)   Eye exam due: 02/2009    Foot exam: yes  (12/11/2008)   Foot exam action/deferral: Do today   High risk foot: No  (12/11/2008)   Foot care education: Done  (12/11/2008)   Foot exam due: 12/11/2009    Urine microalbumin/creatinine ratio: 1158.1  (06/18/2009)   Urine microalbumin action/deferral: Ordered   Urine microalbumin/cr due: 11/24/2007  Lipids   Total Cholesterol: 118  (07/31/2009)   LDL: 74  (07/31/2009)   LDL Direct: Not documented   HDL: 27  (07/31/2009)   Triglycerides: 86  (07/31/2009)    SGOT (AST): 14  (07/31/2009)   SGPT (ALT): 17  (07/31/2009) CMP ordered    Alkaline phosphatase: 59  (07/31/2009)   Total bilirubin: 0.3   (07/31/2009)  Hypertension   Last Blood Pressure: 147 / 81  (12/03/2009)   Serum creatinine: 0.74  (07/31/2009)   Serum potassium 4.5  (07/31/2009) CMP ordered   Self-Management Support :   Personal Goals (by the next clinic visit) :     Personal A1C goal: 7  (12/11/2008)     Personal blood pressure goal: 130/80  (12/11/2008)     Personal LDL goal: 100  (12/11/2008)    Patient will work on the following items until the next clinic visit to reach self-care goals:     Medications and monitoring: take my medicines every day, check my blood sugar, examine my feet every day  (12/03/2009)     Eating: drink diet soda or water instead of juice or soda, eat more vegetables, use fresh  or frozen vegetables, eat foods that are low in salt, eat baked foods instead of fried foods, eat fruit for snacks and desserts  (12/03/2009)     Activity: take a 30 minute walk every day  (12/03/2009)     Home glucose monitoring frequency: 3 times a day  (09/17/2009)    Diabetes self-management support: Written self-care plan  (09/17/2009)   Last diabetes self-management training by diabetes educator: 10/31/2007    Hypertension self-management support: Education handout  (12/03/2009)   Hypertension education handout printed    Lipid self-management support: Education handout  (12/03/2009)     Lipid education handout printed  Laboratory Results   Blood Tests   Date/Time Received: December 03, 2009 4:09 PM  Date/Time Reported: Burke Keels  December 03, 2009 4:09 PM   HGBA1C: 6.9%   (Normal Range: Non-Diabetic - 3-6%   Control Diabetic - 6-8%) CBG Random:: 163mg /dL     CC: BREATHING HARD,? INHALERS NOT SEEM TOBE WORKKING, cough, is there something else you can give me for pain Is Patient Diabetic? Yes Did you bring your meter with you today? Yes Pain Assessment Patient in pain? yes     Location: arms,  Intensity: 7 Type: aching Onset of pain  Chronic Nutritional Status BMI of > 30 =  obese CBG Result 163  Have you ever been in a relationship where you felt threatened, hurt or afraid?No   Does patient need assistance? Functional Status Self care Ambulation Normal  Process Orders Check Orders Results:     [facility]: Check successful Tests Sent for requisitioning (December 05, 2009 5:15 PM):     12/03/2009: [facility] -- T-Urine Microalbumin w/creat. ratio [[id number]] (signed)     12/03/2009: [facility] -- T-Comprehensive Metabolic Panel [[id number]] (signed)     12/03/2009: [facility] -- T-CBC w/Diff [[id number]] (signed)     12/03/2009: [facility] -- T-BNP  (B Natriuretic Peptide) [phone] (signed)

## 2010-05-21 NOTE — Letter (Signed)
Summary: [facility]  [facility]   Imported By: Margie Billet 07/17/2009 15:48:49  _____________________________________________________________________  External Attachment:    Type:   Image     Comment:   External Document

## 2010-05-21 NOTE — Progress Notes (Signed)
Summary: prior authorization-Silenor/gp  Phone Note From Pharmacy   Caller: [facility] Summary of Call: Prior Authorization for Silenor 3mg   has been approved x1 year; until September 19, 2010.  [facility] made award. Initial call taken by: Chinita Pester RN,  September 18, 2009 3:02 PM

## 2010-05-21 NOTE — Progress Notes (Signed)
Summary: Records Request  Faxed Echo, Vascular Study, EKG & Pacer Check to [facility] at [facility] ([id number]). Debby Freiberg  March 04, 2010 6:05 PM

## 2010-05-21 NOTE — Miscellaneous (Signed)
Summary: [facility]  [facility]   Imported By: Marily Memos 10/08/2009 10:57:27  _____________________________________________________________________  External Attachment:    Type:   Image     Comment:   External Document

## 2010-05-21 NOTE — Letter (Signed)
Summary: DIABETIC- LOGBOOK REPORT 07/19/-12/03/2009  DIABETIC- LOGBOOK REPORT 07/19/-12/03/2009   Imported By: Shon Hough 12/23/2009 15:23:33  _____________________________________________________________________  External Attachment:    Type:   Image     Comment:   External Document

## 2010-05-21 NOTE — Progress Notes (Signed)
Summary: refill/gg  Phone Note Refill Request  on January 16, 2010 3:32 PM  Refills Requested: Medication #1:  TRAMADOL HCL 50 MG TABS 1 - 2  tabs by mouth q 6-8 hrs as needed for severe pain   Last Refilled: 12/15/2009  Medication #2:  AMLODIPINE BESYLATE 10 MG TABS Take 1 tablet by mouth once a day   Last Refilled: 12/15/2009 # 240 filled one month ago of tramadol   Method Requested: Fax to Local Pharmacy Initial call taken by: Merrie Roof RN,  January 16, 2010 3:33 PM  Follow-up for Phone Call        Refill approved-nurse to complete. Follow-up by: Margarito Liner MD,  January 16, 2010 3:46 PM  Additional Follow-up for Phone Call Additional follow up Details #1::        Rx faxed to pharmacy Additional Follow-up by: Merrie Roof RN,  January 16, 2010 3:57 PM    Prescriptions: TRAMADOL HCL 50 MG TABS (TRAMADOL HCL) 1 - 2  tabs by mouth q 6-8 hrs as needed for severe pain  #240 x 0   Entered and Authorized by:   Margarito Liner MD   Signed by:   Margarito Liner MD on 01/16/2010   Method used:   Telephoned to ...       [facility] (retail)       [address]       [place], [place]  [address]       Ph: [phone]       Fax: [phone]   RxID:   [id number] AMLODIPINE BESYLATE 10 MG TABS (AMLODIPINE BESYLATE) Take 1 tablet by mouth once a day  #31 x 6   Entered and Authorized by:   Margarito Liner MD   Signed by:   Margarito Liner MD on 01/16/2010   Method used:   Telephoned to ...       [facility] (retail)       [address]       [place], [place]  [address]       Ph: [phone]       Fax: [phone]   RxID:   [id number]

## 2010-05-21 NOTE — Progress Notes (Signed)
Summary: Refill/gh  Phone Note Refill Request Message from:  Fax from Pharmacy on December 15, 2009 2:42 PM  Refills Requested: Medication #1:  PROAIR HFA 108 (90 BASE) MCG/ACT AERS Inhale 2 puffs four times a day as needed for shortness of breath.   Last Refilled: 11/11/2009  Method Requested: Fax to Local Pharmacy Initial call taken by: Angelina Ok RN,  December 15, 2009 2:43 PM  Follow-up for Phone Call        Rx faxed to pharmacy. Follow-up by: Margarito Liner MD,  December 15, 2009 4:43 PM    Prescriptions: PROAIR HFA 108 (90 BASE) MCG/ACT AERS (ALBUTEROL SULFATE) Inhale 2 puffs four times a day as needed for shortness of breath.  #1 x 6   Entered and Authorized by:   Margarito Liner MD   Signed by:   Margarito Liner MD on 12/15/2009   Method used:   Faxed to ...       [facility] (retail)       [address]       [place], [place]  [address]       Ph: [phone]       Fax: [phone]   RxID:   [id number]

## 2010-05-21 NOTE — Assessment & Plan Note (Signed)
Summary: F/U,MC   Vital Signs:  Patient profile:   65 year old female BP sitting:   166 / 74  Vitals Entered By: Lillia Pauls CMA (September 17, 2009 3:35 PM)  Primary Provider:  Margarito Liner, MD   History of Present Illness: Reports to f/u right shoulder pain in setting or suspected rotator cuff tear. Progressing well wrt physical therapy. No home PT routine. Tramadol helping to control pain. Still having some night time pain and pain during performance of ADL.  No past shoulder injections.   Hx of DM . Checks BG QID. Avg values in the 140s.  Allergies: No Known Drug Allergies PMH-FH-SH reviewed for relevance  Physical Exam  General:  Well-developed,well-nourished,in no acute distress; alert,appropriate and cooperative throughout examination Msk:  SHOULDERS: Less pain past 90 deg flex/abd on right. 30 deg ER bilaterally.  Mild improved right IR. 4/5 strength throughout RC testing bilaterally; though strength increased overall.  Mildly decreased pain throughout ROM testing compared to last exam.    Impression & Recommendations:  Problem # 1:  ROTATOR CUFF SYNDROME, RIGHT (ICD-726.10) Assessment Improved Management options, along with respective risks and benefits discussed during this encounter. Declined corticosteroid injection for now. Would like to exhaust conservative options before seeking surgical intervention, especially given her medical comorbities.  - Continue current rehab plan. - Prescription for tramadol faxed to preferred pharmacy. - RTC in 3 weeks. Patient to consider corticosteroid injection in the interim. Will ultrasound right shoulder if no significant improvement.  Complete Medication List: 1)  Glucophage 850 Mg Tabs (Metformin hcl) .... Take 1 tablet by mouth three times a day 2)  Nasonex 50 Mcg/act Susp (Mometasone furoate) .... Take 2 sprays in each nostril once a day 3)  Aggrenox 25-200 Mg Cp12 (Aspirin-dipyridamole) .... Take 1 capsule by  mouth two times a day 4)  Diovan 160 Mg Tabs (Valsartan) .... Take 1 tablet by mouth two times a day 5)  Advair Diskus 250-50 Mcg/dose Misc (Fluticasone-salmeterol) .... Inhale 1 puff two times a day 6)  Multivitamins Tabs (Multiple vitamin) .... Take 1 tablet by mouth once a day 7)  Bd Insulin Syringe U-100 1 Ml Misc (Insulin syringes (disposable)) 8)  Calcium 600/vitamin D 600-400 Mg-unit Tabs (Calcium carbonate-vitamin d) .... Take 1 tablet by mouth two times a day 9)  Accu-chek Compact Test Drum Strp (Glucose blood) .... Use to test blood sugar before & 2 hours after each meal ( 5-6x daily) 10)  Lantus Solostar 100 Unit/ml Soln (Insulin glargine) .... Inject 38 units subcutaneously each evening 11)  Pen Needles 31g X 8 Mm Misc (Insulin pen needle) .... Use to inject insulin once daily 12)  Amlodipine Besylate 10 Mg Tabs (Amlodipine besylate) .... Take 1 tablet by mouth once a day 13)  Lidoderm 5 % Ptch (Lidocaine) .... Apply one patch to right chest wall for pain as directed, on 12 hours off 12 hours 14)  Carvedilol 12.5 Mg Tabs (Carvedilol) .... Take 1and 1/2 tablets by mouth two times a day 15)  Proair Hfa 108 (90 Base) Mcg/act Aers (Albuterol sulfate) .... Inhale 2 puffs four times a day as needed for shortness of breath. 16)  Januvia 50 Mg Tabs (Sitagliptin phosphate) .... Take 1 tablet by mouth once a day 17)  Spiriva Handihaler 18 Mcg Caps (Tiotropium bromide monohydrate) .... Inhale contents of 1 capsule once a day 18)  Fexofenadine Hcl 60 Mg Tabs (Fexofenadine hcl) .... Take 1 tablet by mouth two times a day 19)  Hydrochlorothiazide  25 Mg Tabs (Hydrochlorothiazide) .... Take 1 tablet by mouth once a day 20)  Tramadol Hcl 50 Mg Tabs (Tramadol hcl) .[name].. 1 tab by mouth q 6-8 hrs as needed severe pain 21)  Dexilant 60 Mg Cpdr (Dexlansoprazole) .... Take as directed 1 capsule each morning 15 minutes before breakfast 22)  Crestor 5 Mg Tabs (Rosuvastatin calcium) .... Take 1 tablet by mouth  once a day 23)  Silenor 3 Mg Tabs (Doxepin hcl) .... Take 1 tablet by mouth at bedtime as needed for insomnia Prescriptions: TRAMADOL HCL 50 MG TABS (TRAMADOL HCL) 1 tab by mouth q 6-8 hrs as needed severe pain  #120 x 2   Entered and Authorized by:   Valarie Merino MD   Signed by:   Valarie Merino MD on 09/17/2009   Method used:   Faxed to ...       [facility] (retail)       [address]       [place], [place]  [address]       Ph: [phone]       Fax: [phone]   RxID:   [id number]

## 2010-05-21 NOTE — Assessment & Plan Note (Signed)
Summary: ADD PER MAMIE/CFB   Vital Signs:  Patient profile:   65 year old female Height:      64 inches Weight:      273.3 pounds BMI:     47.08 Temp:     97.1 degrees F oral Pulse rate:   84 / minute BP sitting:   139 / 80  (right arm)  Vitals Entered By: Filomena Jungling NT II (April 23, 2009 10:57 AM) CC: ?HEADACHES- TROUBLE WITH MACHINE Is Patient Diabetic? Yes Did you bring your meter with you today? Yes Pain Assessment Patient in pain? yes     Location: HEADACHES Intensity: 6 Type: aching Onset of pain  Intermittent Nutritional Status BMI of > 30 = obese CBG Result 146  Have you ever been in a relationship where you felt threatened, hurt or afraid?No   Does patient need assistance? Functional Status Self care Ambulation Normal   Primary Care Provider:  Margarito Liner, MD  CC:  ?HEADACHES- TROUBLE WITH MACHINE.  History of Present Illness: Patient returns for follow-up of her diabetes mellitus, hypertension, obstructive sleep apnea, COPD, and other medical problems.  She reports that she is still not sleeping well and is having headaches at night; she attributes this to difficulties with her nasal CPAP mask.  She says that she is compliant with her medications.  She has stable chronic exertional dyspnea.  Her blood sugars have recently been somewhat higher than baseline.  She reports problems with her seasonal allergies for the past month, and she does not feel that the Zyrtec is helping.    Preventive Screening-Counseling & Management  Alcohol-Tobacco     Smoking Status: quit     Year Quit: 2002  Caffeine-Diet-Exercise     Does Patient Exercise: no  Current Medications (verified): 1)  Glucophage 850 Mg Tabs (Metformin Hcl) .... Take 1 Tablet By Mouth Three Times A Day 2)  Nexium 40 Mg  Pack (Esomeprazole Magnesium) .... Take 1 Tablet By Mouth Once A Day 3)  Nasonex 50 Mcg/act Susp (Mometasone Furoate) .... Take 2 Sprays in Each Nostril Once A Day 4)  Zyrtec 10  Mg Tabs (Cetirizine Hcl) .... Take 1 Tablet By Mouth Once A Day 5)  Aggrenox 25-200 Mg Cp12 (Aspirin-Dipyridamole) .... Take 1 Capsule By Mouth Two Times A Day 6)  Lortab 10 10-500 Mg  Tabs (Hydrocodone-Acetaminophen) .... Take 1 Tablet By Mouth Four Times A Day As Needed For Pain 7)  Avalide 300-25 Mg Tabs (Irbesartan-Hydrochlorothiazide) .... Take 1 Tablet By Mouth Once A Day 8)  Advair Diskus 250-50 Mcg/dose Misc (Fluticasone-Salmeterol) .... Inhale 1 Puff Two Times A Day 9)  Multivitamins  Tabs (Multiple Vitamin) .... Take 1 Tablet By Mouth Once A Day 10)  Bd Insulin Syringe U-100 1 Ml  Misc (Insulin Syringes (Disposable)) 11)  Calcium 600/vitamin D 600-400 Mg-Unit Tabs (Calcium Carbonate-Vitamin D) .... Take 1 Tablet By Mouth Two Times A Day 12)  Accu-Chek Compact Test Drum  Strp (Glucose Blood) .... Use To Test Blood Sugar Before & 2 Hours After Each Meal ( 5-6x Daily) 13)  Ferrous Sulfate 325 (65 Fe) Mg Tabs (Ferrous Sulfate) .... Take 1 Tablet By Mouth Three Times A Day 14)  Lantus Solostar 100 Unit/ml Soln (Insulin Glargine) .... Inject 38 Units Subcutaneously Each Evening 15)  Pen Needles 31g X 8 Mm Misc (Insulin Pen Needle) .... Use To Inject Insulin Once Daily 16)  Caduet 10-80 Mg Tabs (Amlodipine-Atorvastatin) .... Take 1 Tablet By Mouth Once A Day 17)  Lidoderm 5 % Ptch (Lidocaine) .... Apply One Patch To Right Chest Wall For Pain As Directed, On 12 Hours Off 12 Hours 18)  Carvedilol 12.5 Mg Tabs (Carvedilol) .... Take 1 Tablet By Mouth Two Times A Day 19)  Proair Hfa 108 (90 Base) Mcg/act Aers (Albuterol Sulfate) .... Inhale 2 Puffs Four Times A Day As Needed For Shortness of Breath. 20)  Januvia 25 Mg Tabs (Sitagliptin Phosphate) .... Take 1 Tablet By Mouth Once A Day 21)  Rozerem 8 Mg Tabs (Ramelteon) .... Take 1 Tablet By Mouth At Bedtime As Needed For Insomnia 22)  Spiriva Handihaler 18 Mcg Caps (Tiotropium Bromide Monohydrate) .... Inhale Contents of 1 Capsule Once A  Day  Allergies (verified): No Known Drug Allergies  Review of Systems General:  Denies chills, fever, and sweats. GI:  Denies abdominal pain, nausea, and vomiting.  Physical Exam  General:  alert, no distress Lungs:  normal respiratory effort, no accessory muscle use, normal breath sounds, no crackles, and no wheezes.   Heart:  normal rate, regular rhythm, 1/6 systolic ejection murmur; no gallop, and no rub.   Extremities:  no edema   Impression & Recommendations:  Problem # 1:  DIABETES MELLITUS, TYPE II (ICD-250.00) Patient's hemoglobin A1c is slightly above target range.  She reports that she is compliant with her medications.  The plan is to increase Januvia to a dose of 50 mg daily, and continue her other medications as before.  She will continue to monitor her blood sugars.  Will recheck a hemoglobin A1c in about 3 months.  Her updated medication list for this problem includes:    Glucophage 850 Mg Tabs (Metformin hcl) .[name]... Take 1 tablet by mouth three times a day    Avalide 300-25 Mg Tabs (Irbesartan-hydrochlorothiazide) .[name]... Take 1 tablet by mouth once a day    Lantus Solostar 100 Unit/ml Soln (Insulin glargine) ..... Inject 38 units subcutaneously each evening    Januvia 25 Mg Tabs (Sitagliptin phosphate) .[name]... Take 2 tablets by mouth once a day  Labs Reviewed: Creat: 0.84 (12/13/2008)     Last Eye Exam: Proliferative diabetic retinopathy OU - stable. Exam by Alford Highland. Rankin, MD (11/20/2008) Reviewed HgBA1c results: 7.3 (04/23/2009)  7.1 (02/12/2009)  Orders: T- Capillary Blood Glucose (82948) T-Hgb A1C (in-house) (16109UE)  Problem # 2:  CONGESTIVE HEART FAILURE (ICD-428.0) A 2-D echocardiogram done on November 3 showed normal left ventricular systolic function; she did have an elevated pulmonary artery peak systolic pressure of 47.  The plan is to continue current medication regimen.  Her updated medication list for this problem includes:    Aggrenox 25-200 Mg  Cp12 (Aspirin-dipyridamole) .[name]... Take 1 capsule by mouth two times a day    Avalide 300-25 Mg Tabs (Irbesartan-hydrochlorothiazide) .[name]... Take 1 tablet by mouth once a day    Carvedilol 12.5 Mg Tabs (Carvedilol) .[name]... Take 1 tablet by mouth two times a day  Problem # 3:  OBSTRUCTIVE SLEEP APNEA (ICD-327.23) The patient is having ongoing problems with her CPAP, with difficulty sleeping, headaches, and fatigue.  The plan is to refer her for a sleep study for CPAP titration.  Orders: Sleep Study Other (Sleep Study Other)  Problem # 4:  HYPERCHOLESTEROLEMIA (ICD-272.0) Patient is doing well on her current regimen; will continue as below.  Her updated medication list for this problem includes:    Caduet 10-80 Mg Tabs (Amlodipine-atorvastatin) .[name]... Take 1 tablet by mouth once a day  Labs Reviewed: SGOT: 16 (12/13/2008)   SGPT:  20 (12/13/2008)  Prior 10 Yr Risk Heart Disease: 24 % (11/24/2006)   HDL:29 (12/13/2008), 30 (05/09/2008)  LDL:68 (12/13/2008), 64 (05/09/2008)  Chol:112 (12/13/2008), 113 (05/09/2008)  Trig:76 (12/13/2008), 95 (05/09/2008)  Problem # 5:  HYPERTENSION (ICD-401.9) Patient is doing reasonably well on current regimen; her systolic pressure is mildly elevated today, but given her recent difficulties sleeping and with headaches I will not adjust her regimen at this time.  If her blood pressure remains above target range after CPAP titration, will adjust her antihypertensive regimen.  Her updated medication list for this problem includes:    Avalide 300-25 Mg Tabs (Irbesartan-hydrochlorothiazide) .[name]... Take 1 tablet by mouth once a day    Caduet 10-80 Mg Tabs (Amlodipine-atorvastatin) .[name]... Take 1 tablet by mouth once a day    Carvedilol 12.5 Mg Tabs (Carvedilol) .[name]... Take 1 tablet by mouth two times a day  BP today: 139/80 Prior BP: 120/75 (02/12/2009)  Prior 10 Yr Risk Heart Disease: 24 % (11/24/2006)  Labs Reviewed: K+: 4.1 (12/13/2008) Creat: : 0.84 (12/13/2008)    Chol: 112 (12/13/2008)   HDL: 29 (12/13/2008)   LDL: 68 (12/13/2008)   TG: 76 (12/13/2008)  Problem # 6:  ALLERGIC RHINITIS, SEASONAL (ICD-477.0) Given her lack of symptomatic relief on Zyrtec, the plan is to stop Zyrtec and start Allegra.  Complete Medication List: 1)  Glucophage 850 Mg Tabs (Metformin hcl) .... Take 1 tablet by mouth three times a day 2)  Nexium 40 Mg Pack (Esomeprazole magnesium) .... Take 1 tablet by mouth once a day 3)  Nasonex 50 Mcg/act Susp (Mometasone furoate) .... Take 2 sprays in each nostril once a day 4)  Aggrenox 25-200 Mg Cp12 (Aspirin-dipyridamole) .... Take 1 capsule by mouth two times a day 5)  Lortab 10 10-500 Mg Tabs (Hydrocodone-acetaminophen) .... Take 1 tablet by mouth four times a day as needed for pain 6)  Avalide 300-25 Mg Tabs (Irbesartan-hydrochlorothiazide) .... Take 1 tablet by mouth once a day 7)  Advair Diskus 250-50 Mcg/dose Misc (Fluticasone-salmeterol) .... Inhale 1 puff two times a day 8)  Multivitamins Tabs (Multiple vitamin) .... Take 1 tablet by mouth once a day 9)  Bd Insulin Syringe U-100 1 Ml Misc (Insulin syringes (disposable)) 10)  Calcium 600/vitamin D 600-400 Mg-unit Tabs (Calcium carbonate-vitamin d) .... Take 1 tablet by mouth two times a day 11)  Accu-chek Compact Test Drum Strp (Glucose blood) .... Use to test blood sugar before & 2 hours after each meal ( 5-6x daily) 12)  Ferrous Sulfate 325 (65 Fe) Mg Tabs (Ferrous sulfate) .... Take 1 tablet by mouth three times a day 13)  Lantus Solostar 100 Unit/ml Soln (Insulin glargine) .... Inject 38 units subcutaneously each evening 14)  Pen Needles 31g X 8 Mm Misc (Insulin pen needle) .... Use to inject insulin once daily 15)  Caduet 10-80 Mg Tabs (Amlodipine-atorvastatin) .... Take 1 tablet by mouth once a day 16)  Lidoderm 5 % Ptch (Lidocaine) .... Apply one patch to right chest wall for pain as directed, on 12 hours off 12 hours 17)  Carvedilol 12.5 Mg Tabs (Carvedilol) .... Take  1 tablet by mouth two times a day 18)  Proair Hfa 108 (90 Base) Mcg/act Aers (Albuterol sulfate) .... Inhale 2 puffs four times a day as needed for shortness of breath. 19)  Januvia 25 Mg Tabs (Sitagliptin phosphate) .... Take 2 tablets by mouth once a day 20)  Rozerem 8 Mg Tabs (Ramelteon) .... Take 1 tablet by mouth at bedtime as  needed for insomnia 21)  Spiriva Handihaler 18 Mcg Caps (Tiotropium bromide monohydrate) .... Inhale contents of 1 capsule once a day 22)  Fexofenadine Hcl 60 Mg Tabs (Fexofenadine hcl) .... Take 1 tablet by mouth two times a day  Other Orders: T-Comprehensive Metabolic Panel ([id number]) T-Hemoccult Card-Multiple (take home) (09811)  Patient Instructions: 1)  Please schedule a follow-up appointment in 2 months. 2)  Increase Januvia 25 mg to a dose of 2 tablets daily. 3)  An appointment has been requested for a sleep study for CPAP titration. 4)  Stop Zyrtec. 5)  Start fexofenadine (Allegra) 60 mg two times a day for seasonal allergies.  Prescriptions: FEXOFENADINE HCL 60 MG TABS (FEXOFENADINE HCL) Take 1 tablet by mouth two times a day  #62 x 5   Entered and Authorized by:   Margarito Liner MD   Signed by:   Margarito Liner MD on 04/23/2009   Method used:   Print then Give to Patient   RxID:   [id number] JANUVIA 25 MG TABS (SITAGLIPTIN PHOSPHATE) Take 2 tablets by mouth once a day  #62 x 5   Entered and Authorized by:   Margarito Liner MD   Signed by:   Margarito Liner MD on 04/23/2009   Method used:   Print then Give to Patient   RxID:   [id number]    Prevention & Chronic Care Immunizations   Influenza vaccine: Fluvax 3+  (02/12/2009)   Influenza vaccine due: 01/11/2008    Tetanus booster: 02/12/2009: Tdap    Pneumococcal vaccine: Pneumovax  (02/01/2001)   Pneumococcal vaccine due: None    H. zoster vaccine: Not documented  Colorectal Screening   Hemoccult: negative x 3  (12/20/2007)   Hemoccult action/deferral: Ordered  (04/23/2009)    Hemoccult due: 12/19/2008    Colonoscopy: Normal  (02/13/2004)   Colonoscopy due: 02/12/2014  Other Screening   Pap smear: Normal  (10/13/2001)   Pap smear action/deferral: Deferred  (12/11/2008)   Pap smear due: 10/14/2002    Mammogram: Assessment: BIRADS 1: Negative   Location: [facility]   (01/08/2009)   Mammogram due: 01/2010    DXA bone density scan: AP Spine L1-L4:  T-score -1.4 (Osteopenia) Dual Femur Neck Left:  T-score -0.6 (Normal) Dual Femur Neck Right: T-score -1.0 (Normal)    (10/04/2007)   DXA scan due: None    Smoking status: quit  (04/23/2009)  Diabetes Mellitus   HgbA1C: 7.3  (04/23/2009)   HgbA1C action/deferral: Ordered  (12/11/2008)   Hemoglobin A1C due: 05/27/2007    Eye exam: Proliferative diabetic retinopathy OU - stable. Exam by Alford Highland. Rankin, MD  (11/20/2008)   Eye exam due: 02/2009    Foot exam: yes  (12/11/2008)   Foot exam action/deferral: Do today   High risk foot: No  (12/11/2008)   Foot care education: Done  (12/11/2008)   Foot exam due: 12/11/2009    Urine microalbumin/creatinine ratio: 31.5  (05/09/2008)   Urine microalbumin/cr due: 11/24/2007    Diabetes flowsheet reviewed?: Yes   Progress toward A1C goal: Unchanged  Lipids   Total Cholesterol: 112  (12/13/2008)   LDL: 68  (12/13/2008)   LDL Direct: Not documented   HDL: 29  (12/13/2008)   Triglycerides: 76  (12/13/2008)    SGOT (AST): 16  (12/13/2008)   SGPT (ALT): 20  (12/13/2008) CMP ordered    Alkaline phosphatase: 61  (12/13/2008)   Total bilirubin: 0.2  (12/13/2008)    Lipid flowsheet reviewed?: Yes   Progress toward LDL goal:  At goal  Hypertension   Last Blood Pressure: 139 / 80  (04/23/2009)   Serum creatinine: 0.84  (12/13/2008)   Serum potassium 4.1  (12/13/2008) CMP ordered     Hypertension flowsheet reviewed?: Yes   Progress toward BP goal: Deteriorated  Self-Management Support :   Personal Goals (by the next clinic visit) :      Personal A1C goal: 7  (12/11/2008)     Personal blood pressure goal: 130/80  (12/11/2008)     Personal LDL goal: 100  (12/11/2008)    Patient will work on the following items until the next clinic visit to reach self-care goals:     Medications and monitoring: take my medicines every day, check my blood sugar, examine my feet every day  (04/23/2009)     Eating: drink diet soda or water instead of juice or soda, eat more vegetables, use fresh or frozen vegetables, eat foods that are low in salt, eat baked foods instead of fried foods, eat fruit for snacks and desserts  (04/23/2009)     Activity: park at the far end of the parking lot  (04/23/2009)    Diabetes self-management support: Copy of home glucose meter record, Written self-care plan  (04/23/2009)   Diabetes care plan printed   Last diabetes self-management training by diabetes educator: 10/31/2007    Hypertension self-management support: Written self-care plan  (04/23/2009)   Hypertension self-care plan printed.    Lipid self-management support: Written self-care plan  (04/23/2009)   Lipid self-care plan printed.   Nursing Instructions: Provide Hemoccult cards with instructions (see order)      Process Orders Check Orders Results:     [facility]: Check successful Tests Sent for requisitioning (April 26, 2009 1:27 PM):     04/23/2009: [facility] -- T-Comprehensive Metabolic Panel [[id number]] (signed)    Laboratory Results   Blood Tests   Date/Time Received: April 23, 2009 11:17 AM Date/Time Reported: Alric Quan  April 23, 2009 11:17 AM  HGBA1C: 7.3%   (Normal Range: Non-Diabetic - 3-6%   Control Diabetic - 6-8%) CBG Random:: 146mg /dL

## 2010-05-21 NOTE — Progress Notes (Signed)
Summary: diabetes testing supply forms/dmr  Phone Note Outgoing Call   Call placed by: Jamison Neighbor RD,CDE,  May 26, 2009 5:03 PM Summary of Call: called patient and discussed diabetes testing supply papers form a second mail order compnay in 2 months. She said they have been calling her nad are very insistant that she try them. she has no problems with [facility] and there fore agrees to stay with them and gave me permsission to destroy paperwork from other companies unless hse calls [place] to notify us of a change. patient givien [facility] phone number ot onctact to be sure she will continue to get supplies from them.

## 2010-05-21 NOTE — Consult Note (Signed)
Summary: [facility]  [facility]   Imported By: Margie Billet 09/10/2009 15:20:41  _____________________________________________________________________  External Attachment:    Type:   Image     Comment:   External Document

## 2010-05-21 NOTE — Letter (Signed)
Summary: [facility]   Imported By: Florinda Marker 06/25/2009 10:43:36  _____________________________________________________________________  External Attachment:    Type:   Image     Comment:   External Document

## 2010-05-21 NOTE — Assessment & Plan Note (Signed)
Summary: leg pain/gg   Vital Signs:  Patient profile:   65 year old female Height:      64 inches Weight:      279.7 pounds BMI:     48.18 Temp:     97.8 degrees F oral Pulse rate:   69 / minute BP sitting:   127 / 74  (right arm)  Vitals Entered By: Filomena Jungling NT II (March 30, 2010 8:59 AM) CC: LEFT LEG Is Patient Diabetic? Yes Did you bring your meter with you today? No Pain Assessment Patient in pain? yes     Location: LEFT LEG Intensity: 10 Type: aching Onset of pain  Chronic Nutritional Status BMI of > 30 = obese CBG Result 110  Does patient need assistance? Functional Status Self care Ambulation Normal   Diabetic Foot Exam Foot Inspection Is there a history of a foot ulcer?              No Is there a foot ulcer now?              No Is there swelling or an abnormal foot shape?          No Are the toenails long?                No Are the toenails thick?                No Are the toenails ingrown?              No Is there heavy callous build-up?              No Is there pain in the calf muscle (Intermittent claudication) when walking?    NoIs there a claw toe deformity?              No Is there elevated skin temperature?            No Is there limited ankle dorsiflexion?            No Is there foot or ankle muscle weakness?            No  Diabetic Foot Care Education Pulse Check          Right Foot          Left Foot Posterior Tibial:        normal            normal Dorsalis Pedis:        normal            normal  High Risk Feet? No   10-g (5.07) Semmes-Weinstein Monofilament Test Performed by: Jaci Lazier MD          Right Foot          Left Foot Visual Inspection               Test Control      normal         normal Site 1         normal         normal Site 2         normal         normal Site 3         normal         normal Site 4         normal         normal Site 5  normal         normal Site 6         normal         normal Site 7          normal         normal Site 8         normal         normal Site 9         normal         normal Site 10         normal         normal  Impression      normal         normal   Primary Care Christian Treadway:  Margarito Liner, MD  CC:  LEFT LEG.  History of Present Illness: Pt with pmh outlined below presenting with chronic thigh pain. States that this has been ongoing for several years, describes a tingling sensation in her anterior thighs bilaterally L>>R, present throughout most of the day, but worse after standing for extended period of time and also at nighttime, causing her to have frequent nighttime awakenings. She mentions that she has tried several otc meds and none have provided relief.  She complains of intermittent lower back pain, but states that the discomfort is manageable and not as concerning as the thigh pain. She denies any focal weakness, numbness, bowel/bladder incontinence, groin pain,  hip pain or perineal numbness. She denies any intermittent fevers, chills, sob, cp, n/v/d or any other systemic symptoms.  Of note, her thigh pain was considered to be possibly 2/2 to her statin use, for which she was switched to Crestor in June '11.   Preventive Screening-Counseling & Management  Alcohol-Tobacco     Smoking Status: quit     Year Quit: 2002  Caffeine-Diet-Exercise     Does Patient Exercise: no  Current Problems (verified): 1)  Need Prophylactic Vaccination&inoculation Flu  (ICD-V04.81) 2)  Intermittent Claudication, Bilateral  (ICD-443.9) 3)  Rotator Cuff Syndrome, Right  (ICD-726.10) 4)  Microalbuminuria  (ICD-791.0) 5)  Shoulder Pain, Right  (ICD-719.41) 6)  Diabetes Mellitus, Type II  (ICD-250.00) 7)  Retinopathy, Diabetic, Proliferative  (ICD-362.02) 8)  Hemorrhage, Vitreous  (ICD-379.23) 9)  Peripheral Neuropathy  (ICD-356.9) 10)  Hand Pain, Bilateral  (ICD-729.5) 11)  Hypertension  (ICD-401.9) 12)  Congestive Heart Failure  (ICD-428.0) 13)  Obstructive  Sleep Apnea  (ICD-327.23) 14)  COPD  (ICD-496) 15)  Dyspnea On Exertion  (ICD-786.09) 16)  Carcinoid Tumor, 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) 17)  Thoracotomy, Hx of  (ICD-V15.2) 18)  Rib Pain, Right Sided  (ICD-786.50) 19)  Chronic Postthoracotomy Pain  (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) 20)  Allergic Rhinitis, Seasonal  (ICD-477.0) 21)  Breast Cancer, Hx of  (ICD-V10.3) 22)  Lumpectomy, Breast, Hx of  (ICD-V15.2) 23)  Transient Ischemic Attack, Hx of  (ICD-V12.50) 24)  Hypercholesterolemia  (ICD-272.0) 25)  Gerd  (ICD-530.81) 26)  Av Block, Complete  (ICD-426.0) 27)  Pacemaker, Permanent  (ICD-V45.01) 28)  Anemia-iron Deficiency  (ICD-280.9) 29)  Myalgia  (ICD-729.1) 30)  Skin Rash  (ICD-782.1) 31)  Ankle Edema  (ICD-782.3) 32)  Insomnia  (ICD-780.52) 33)  Malaise and Fatigue  (ICD-780.79) 34)  Degenerative Joint Disease  (ICD-715.90) 35)  Osteopenia  (ICD-733.90) 36)  Colonic Polyps, Adenomatous, Hx of  (ICD-V12.72) 37)  Urinary Incontinence  (ICD-788.30) 38)  Tobacco Abuse, Hx of  (ICD-V15.82) 39)  Sinusitis, Chronic  (ICD-473.9)  Current Medications (verified): 1)  Glucophage 850  Mg Tabs (Metformin Hcl) .... Take 1 Tablet By Mouth Three Times A Day 2)  Nasonex 50 Mcg/act Susp (Mometasone Furoate) .... Take 2 Sprays in Each Nostril Once A Day 3)  Aggrenox 25-200 Mg Cp12 (Aspirin-Dipyridamole) .... Take 1 Capsule By Mouth Two Times A Day 4)  Diovan 160 Mg Tabs (Valsartan) .... Take 1 Tablet By Mouth Two Times A Day 5)  Advair Diskus 250-50 Mcg/dose Misc (Fluticasone-Salmeterol) .... Inhale 1 Puff Two Times A Day 6)  Multivitamins  Tabs (Multiple Vitamin) .... Take 1 Tablet By Mouth Once A Day 7)  Bd Insulin Syringe U-100 1 Ml  Misc (Insulin Syringes (Disposable)) 8)  Calcium 600/vitamin D 600-400 Mg-Unit Tabs (Calcium Carbonate-Vitamin D) .... Take 1 Tablet By Mouth Two Times A Day 9)  Accu-Chek Compact Test Drum  Strp (Glucose Blood) .... Use To Test Blood Sugar Before & 2 Hours After Each Meal ( 5-6x Daily) 10)   Lantus Solostar 100 Unit/ml Soln (Insulin Glargine) .... Inject 38 Units Subcutaneously Each Evening 11)  Pen Needles 31g X 8 Mm Misc (Insulin Pen Needle) .... Use To Inject Insulin Once Daily 12)  Amlodipine Besylate 10 Mg Tabs (Amlodipine Besylate) .... Take 1 Tablet By Mouth Once A Day 13)  Lidoderm 5 % Ptch (Lidocaine) .... Apply One Patch To Right Chest Wall For Pain As Directed, On 12 Hours Off 12 Hours 14)  Carvedilol 12.5 Mg Tabs (Carvedilol) .... Take 1and 1/2 Tablets By Mouth Two Times A Day 15)  Proair Hfa 108 (90 Base) Mcg/act Aers (Albuterol Sulfate) .... Inhale 2 Puffs Four Times A Day As Needed For Shortness of Breath. 16)  Januvia 50 Mg Tabs (Sitagliptin Phosphate) .... Take 1 Tablet By Mouth Once A Day 17)  Spiriva Handihaler 18 Mcg Caps (Tiotropium Bromide Monohydrate) .... Inhale Contents of 1 Capsule Once A Day 18)  Fexofenadine Hcl 180 Mg Tabs (Fexofenadine Hcl) .... Two Times A Day 19)  Hydrochlorothiazide 25 Mg Tabs (Hydrochlorothiazide) .... Take 1 Tablet By Mouth Once A Day 20)  Tramadol Hcl 50 Mg Tabs (Tramadol Hcl) .[name].. 1 - 2  Tabs By Mouth Q 6-8 Hrs As Needed For Severe Pain 21)  Dexilant 60 Mg Cpdr (Dexlansoprazole) .... Take As Directed 1 Capsule Each Morning 15 Minutes Before Breakfast 22)  Crestor 5 Mg Tabs (Rosuvastatin Calcium) .... Take 1 Tablet By Mouth Once A Day 23)  Silenor 3 Mg Tabs (Doxepin Hcl) .... Take 1 Tablet By Mouth At Bedtime As Needed For Insomnia 24)  Hydrocodone-Acetaminophen 5-325 Mg Tabs (Hydrocodone-Acetaminophen) .... Take 1-2  Tablets By Mouth Four Times A Day As Needed For Pain 25)  Neurontin 100 Mg Caps (Gabapentin) .[name].. 1 Tablet By Mouth Three Times A Day  Allergies (verified): No Known Drug Allergies  Past History:  Past Medical History: Last updated: 12/06/2007 Congestive heart failure COPD Hypertension Transient ischemic attack, hx of Anemia-iron deficiency Breast cancer, hx of; S/P left lumpectomy with axillary lymph node  dissection 12/92, treated with Tamoxifen for 2 years; S/P right lumpectomy with axillary lymph node dissection 08/95, treated with Arimidex for 5 years; folllowed by Dr. Valentino Hue. Magrinat. 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 [name] 02/28/2006 Skin rash - recurrent legs Urinary incontinence  Past Surgical History: Last updated: 03/07/2006 Lumpectomy, left, with axillary lymph node dissection 12/92  Lumpectomy,right, with axillary lymph node dissection 8/95  Lung-lobectomy Permanent pacemaker Cholecystectomy Tonsillectomy  Family History: Last updated: 10/20/2006 No significant family history noted.   Social History: Last updated: 12/06/2007 Former Smoker Alcohol use-no Drug use-no  Has Medicare and Medicaid.  Lives alone.   Risk Factors: Exercise: no (03/30/2010)  Risk Factors: Smoking Status: quit (03/30/2010)  Review of Systems      See HPI  Physical Exam  General:  alert.   Head:  normocephalic and atraumatic.   Eyes:  vision grossly intact.   Ears:  no external deformities.   Nose:  no nasal discharge.   Neck:  supple.   Lungs:  normal respiratory effort and normal breath sounds.   Heart:  normal rate, regular rhythm, and no murmur.   Abdomen:  morbidly obese, non-tender.   Msk:  no spinal or paraspinal muscle tenderness, straight leg raise negative bilaterally Pulses:  normal peripheral pulses Extremities:  no edema or cyanosis Neurologic:   alert & oriented X3, strength normal in all extremities, sensation intact to light touch, and gait normal.   Skin:  color normal.   Psych:  normally interactive.    Diabetes Management Exam:    Foot Exam (with socks and/or shoes not present):       Sensory-Monofilament:          Left foot: normal          Right foot: normal   Impression & Recommendations:  Problem # 1:  MYALGIA (ICD-729.1) Differential for her bilateral anterior thigh pain/tingling is broad, ddx include disc herniation, cord compression (? mets) or from marked degenerative changes, femoral nerve compression, ? if this is muscle pain from statin use. Will start by checking a plain film of her lumbar spine as well as plain films of her hip to r/o malignancy vs other marked degenerative changes/bony abnormality that may explain her symptoms, although MRI will be more sensitive in picking up disc herniation/cord compression. WIll also check bmp to assess her electrolyte status. Check CK to r/o statin induced myalgia. She's been instructed to discontinue the Crestor for the next 3 weeks, until her next appointment. In the meantime, will rx as neuropathic pain with a low dose of Neurontin 100mg  three times a day. To reevaluate when she comes back to the clinic in 3 weeks.  Orders: T-Basic Metabolic Panel [phone]) T-TSH [phone]) Diagnostic X-Ray/Fluoroscopy (Diagnostic X-Ray/Flu) T-CK Total [phone])  Problem # 2:  HYPERTENSION (ICD-401.9) BP well controlled on current regimen. No changes today. Her updated medication list for this problem includes:    Diovan 160 Mg Tabs (Valsartan) .[name]... Take 1 tablet by mouth two times a day    Amlodipine Besylate 10 Mg Tabs (Amlodipine besylate) .[name]... Take 1 tablet by mouth once a day    Carvedilol 12.5 Mg Tabs (Carvedilol) .[name]... Take 1and 1/2 tablets by mouth two times a day    Hydrochlorothiazide 25 Mg Tabs (Hydrochlorothiazide) .[name]... Take 1 tablet by mouth once a day  BP  today: 127/74 Prior BP: 145/78 (03/03/2010)  Prior 10 Yr Risk Heart Disease: 24 % (11/24/2006)  Labs Reviewed: K+: 4.5 (07/31/2009) Creat: : 0.74 (07/31/2009)   Chol: 118 (07/31/2009)   HDL: 27 (07/31/2009)   LDL: 74 (07/31/2009)   TG: 86 (07/31/2009)  Problem # 3:  DIABETES  MELLITUS, TYPE II (ICD-250.00) No changes to meds today. Foot exam wnl. Needs an eye exam scheduled at next visit. Her updated medication list for this problem includes:    Glucophage 850 Mg Tabs (Metformin hcl) .[name]... Take 1 tablet by mouth three times a day    Diovan 160 Mg Tabs (Valsartan) .[name]... Take 1 tablet by mouth two times a day    Lantus Solostar 100 Unit/ml Soln (Insulin glargine) ..... Inject 38 units subcutaneously each evening    Januvia 50 Mg Tabs (Sitagliptin phosphate) .[name]... Take 1 tablet by mouth once a day  Orders: T- Capillary Blood Glucose (52841) T-Hgb A1C (in-house) (32440NU)  Labs Reviewed: Creat: 0.74 (07/31/2009)     Last Eye Exam: Proliferative diabetic retinopathy OU - stable. Exam by Alford Highland. Rankin, MD (11/20/2008) Reviewed HgBA1c results: 7.1 (03/30/2010)  6.9 (12/03/2009)  Problem # 4:  HYPERCHOLESTEROLEMIA (ICD-272.0) Assessment: Improved  Her updated medication list for this problem includes:    Crestor 5 Mg Tabs (Rosuvastatin calcium) .[name]... Take 1 tablet by mouth once a day  Labs Reviewed: SGOT: 14 (07/31/2009)   SGPT: 17 (07/31/2009)  Prior 10 Yr Risk Heart Disease: 24 % (11/24/2006)   HDL:27 (07/31/2009), 29 (12/13/2008)  LDL:74 (07/31/2009), 68 (12/13/2008)  Chol:118 (07/31/2009), 112 (12/13/2008)  Trig:86 (07/31/2009), 76 (12/13/2008)  Complete Medication List: 1)  Glucophage 850 Mg Tabs (Metformin hcl) .... Take 1 tablet by mouth three times a day 2)  Nasonex 50 Mcg/act Susp (Mometasone furoate) .... Take 2 sprays in each nostril once a day 3)  Aggrenox 25-200 Mg Cp12 (Aspirin-dipyridamole) .... Take 1 capsule by mouth two times a day 4)  Diovan 160 Mg Tabs  (Valsartan) .... Take 1 tablet by mouth two times a day 5)  Advair Diskus 250-50 Mcg/dose Misc (Fluticasone-salmeterol) .... Inhale 1 puff two times a day 6)  Multivitamins Tabs (Multiple vitamin) .... Take 1 tablet by mouth once a day 7)  Bd Insulin Syringe U-100 1 Ml Misc (Insulin syringes (disposable)) 8)  Calcium 600/vitamin D 600-400 Mg-unit Tabs (Calcium carbonate-vitamin d) .... Take 1 tablet by mouth two times a day 9)  Accu-chek Compact Test Drum Strp (Glucose blood) .... Use to test blood sugar before & 2 hours after each meal ( 5-6x daily) 10)  Lantus Solostar 100 Unit/ml Soln (Insulin glargine) .... Inject 38 units subcutaneously each evening 11)  Pen Needles 31g X 8 Mm Misc (Insulin pen needle) .... Use to inject insulin once daily 12)  Amlodipine Besylate 10 Mg Tabs (Amlodipine besylate) .... Take 1 tablet by mouth once a day 13)  Lidoderm 5 % Ptch (Lidocaine) .... Apply one patch to right chest wall for pain as directed, on 12 hours off 12 hours 14)  Carvedilol 12.5 Mg Tabs (Carvedilol) .... Take 1and 1/2 tablets by mouth two times a day 15)  Proair Hfa 108 (90 Base) Mcg/act Aers (Albuterol sulfate) .... Inhale 2 puffs four times a day as needed for shortness of breath. 16)  Januvia 50 Mg Tabs (Sitagliptin phosphate) .... Take 1 tablet by mouth once a day 17)  Spiriva Handihaler 18 Mcg Caps (Tiotropium bromide monohydrate) .... Inhale contents of 1 capsule once a day 18)  Fexofenadine Hcl 180 Mg Tabs (Fexofenadine hcl) .... Two times a day 19)  Hydrochlorothiazide 25 Mg Tabs (Hydrochlorothiazide) .... Take 1 tablet by mouth once a day 20)  Tramadol Hcl 50 Mg Tabs (Tramadol hcl) .[name].. 1 - 2  tabs by mouth q 6-8 hrs as needed for severe pain 21)  Dexilant 60 Mg Cpdr (Dexlansoprazole) .... Take as directed 1 capsule each morning 15 minutes before breakfast 22)  Crestor 5 Mg Tabs (Rosuvastatin calcium) .... Take 1 tablet by mouth once a day 23)  Silenor 3 Mg Tabs (Doxepin hcl) .... Take  1 tablet by mouth at bedtime as needed for insomnia 24)  Hydrocodone-acetaminophen 5-325 Mg Tabs (Hydrocodone-acetaminophen) .... Take 1-2  tablets by mouth four times a day as needed for pain 25)  Neurontin 100 Mg Caps (Gabapentin) .[name].. 1 tablet by mouth three times a day  Patient Instructions: 1)  Do not take your Crestor for the next 3 weeks. 2)  Make a followup appointment with Dr. Meredith Pel or Dr. Narda Bonds for the next 3 weeks. 3)  Pls call the clinic if you have any questions or concerns. Prescriptions: NEURONTIN 100 MG CAPS (GABAPENTIN) 1 tablet by mouth three times a day  #90 x 1   Entered and Authorized by:   Jaci Lazier MD   Signed by:   Jaci Lazier MD on 03/30/2010   Method used:   Faxed to ...       [facility] (retail)       [address]       [place], [place]  [address]       Ph: [phone]       Fax: [phone]   RxID:   [id number]    Orders Added: 1)  T- Capillary Blood Glucose [82948] 2)  T-Hgb A1C (in-house) [83036QW] 3)  T-Basic Metabolic Panel [[id number]] 4)  T-TSH [[id number]] 5)  Diagnostic X-Ray/Fluoroscopy [Diagnostic X-Ray/Flu] 6)  T-CK Total [82550-23250] 7)  Est. Patient Level IV [27253]    Prevention & Chronic Care Immunizations   Influenza vaccine: Fluvax MCR  (02/11/2010)   Influenza vaccine due: 12/18/2009    Tetanus booster: 02/12/2009: Tdap   Tetanus booster due: 02/13/2019    Pneumococcal vaccine: Pneumovax  (02/01/2001)   Pneumococcal vaccine due: 09/10/2012    H. zoster vaccine: Not documented  Colorectal Screening   Hemoccult: Negative X3  (05/01/2009)   Hemoccult action/deferral: Ordered  (04/23/2009)   Hemoccult due: 12/19/2008    Colonoscopy: Normal  (02/13/2004)   Colonoscopy due: 02/12/2014  Other Screening   Pap smear: STATEMENT Of SPECIMEN ADEQUACY: Satisfactory for evaluation Endocervical/Transformation zone component PRESENT. INTERPRETATION: NEGATIVE FOR INTRAEPITHELIAL  LESIONS OR MALIGNANCY.  (07/30/2009)   Pap smear action/deferral: GYN Referral  (06/18/2009)   Pap smear due: 07/2010    Mammogram: Bi-Rads 1: Negative. Done at [facility]  (01/14/2010)   Mammogram due: 01/2011    DXA bone density scan: AP Spine L1-L4:  T-score -1.4 (Osteopenia) Dual Femur Neck Left:  T-score -0.6 (Normal) Dual Femur Neck Right: T-score -1.0 (Normal)    (10/04/2007)   DXA scan due: None    Smoking status: quit  (03/30/2010)  Diabetes Mellitus   HgbA1C: 7.1  (03/30/2010)   HgbA1C action/deferral: Ordered  (12/11/2008)   Hemoglobin A1C due: 05/27/2007    Eye exam: Proliferative diabetic retinopathy OU - stable. Exam by Alford Highland. Rankin, MD  (11/20/2008)   Eye exam due: 02/2009    Foot exam: yes  (03/30/2010)   Foot exam action/deferral: Do today   High risk foot: No  (03/30/2010)   Foot care education: Done  (12/11/2008)   Foot exam due: 12/11/2009    Urine microalbumin/creatinine ratio: 1158.1  (06/18/2009)   Urine microalbumin action/deferral: Ordered   Urine microalbumin/cr due: 11/24/2007  Diabetes flowsheet reviewed?: Yes   Progress toward A1C goal: Unchanged  Lipids   Total Cholesterol: 118  (07/31/2009)   LDL: 74  (07/31/2009)   LDL Direct: Not documented   HDL: 27  (07/31/2009)   Triglycerides: 86  (07/31/2009)    SGOT (AST): 14  (07/31/2009)   SGPT (ALT): 17  (07/31/2009)   Alkaline phosphatase: 59  (07/31/2009)   Total bilirubin: 0.3  (07/31/2009)    Lipid flowsheet reviewed?: Yes   Progress toward LDL goal: At goal  Hypertension   Last Blood Pressure: 127 / 74  (03/30/2010)   Serum creatinine: 0.74  (07/31/2009)   Serum potassium 4.5  (07/31/2009)    Hypertension flowsheet reviewed?: Yes   Progress toward BP goal: Improved  Self-Management Support :   Personal Goals (by the next clinic visit) :     Personal A1C goal: 7  (12/11/2008)     Personal blood pressure goal: 130/80  (12/11/2008)     Personal LDL goal: 100   (12/11/2008)    Patient will work on the following items until the next clinic visit to reach self-care goals:     Medications and monitoring: take my medicines every day, check my blood sugar, examine my feet every day  (03/30/2010)     Eating: eat foods that are low in salt, eat baked foods instead of fried foods, eat fruit for snacks and desserts  (03/30/2010)     Activity: take a 30 minute walk every day  (12/03/2009)     Home glucose monitoring frequency: 3 times a day  (09/17/2009)    Diabetes self-management support: Written self-care plan  (09/17/2009)   Last diabetes self-management training by diabetes educator: 10/31/2007    Hypertension self-management support: Education handout  (12/03/2009)    Lipid self-management support: Education handout  (12/03/2009)    Nursing Instructions: Diabetic foot exam today   Laboratory Results   Blood Tests   Date/Time Received: March 30, 2010 9:21 AM  Date/Time Reported: Alric Quan  March 30, 2010 9:21 AM   HGBA1C: 7.1%   (Normal Range: Non-Diabetic - 3-6%   Control Diabetic - 6-8%) CBG Random:: 110mg /dL  Comments: Patient had Diet Peps[employer] only Alric Quan  March 30, 2010 9:21 AM      Process Orders Check Orders Results:     [facility]: Order checked:       NOT AUTHORIZED TO ORDER Tests Sent for requisitioning (March 30, 2010 6:41 PM):     03/30/2010: [facility] -- T-Basic Metabolic Panel [id number] (signed)     03/30/2010: [facility] -- T-TSH [phone] (signed)     03/30/2010: [facility] -- T-CK Total [[id number]] (signed)

## 2010-05-21 NOTE — Progress Notes (Signed)
Summary: Refill/gh  Phone Note Refill Request Message from:  Fax from Pharmacy on February 16, 2010 10:49 AM  Refills Requested: Medication #1:  HYDROCODONE-ACETAMINOPHEN 5-325 MG TABS Take 1-2  tablets by mouth four times a day as needed for pain.   Last Refilled: 01/05/2010  Medication #2:  DIOVAN 160 MG TABS Take 1 tablet by mouth two times a day   Last Refilled: 01/16/2010  Medication #3:  TRAMADOL HCL 50 MG TABS 1 - 2  tabs by mouth q 6-8 hrs as needed for severe pain   Last Refilled: 01/16/2010  Medication #4:  AGGRENOX 25-200 MG CP12 Take 1 capsule by mouth two times a day   Last Refilled: 01/16/2010  Method Requested: Electronic Initial call taken by: Angelina Ok RN,  February 16, 2010 10:50 AM  Follow-up for Phone Call        Hydrocodone/acetaminophen refill approved - nurse to complete.  Other refills faxed to pharmacy. Follow-up by: Margarito Liner MD,  February 16, 2010 3:23 PM  Additional Follow-up for Phone Call Additional follow up Details #1::        Rx called to pharmacy Additional Follow-up by: Angelina Ok RN,  February 17, 2010 10:03 AM    Prescriptions: HYDROCODONE-ACETAMINOPHEN 5-325 MG TABS (HYDROCODONE-ACETAMINOPHEN) Take 1-2  tablets by mouth four times a day as needed for pain  #60 x 1   Entered and Authorized by:   Margarito Liner MD   Signed by:   Margarito Liner MD on 02/16/2010   Method used:   Telephoned to ...       [facility] (retail)       [address]       [place], [place]  [address]       Ph: [phone]       Fax: [phone]   RxID:   [id number] AGGRENOX 25-200 MG CP12 (ASPIRIN-DIPYRIDAMOLE) Take 1 capsule by mouth two times a day  #62 x 9   Entered and Authorized by:   Margarito Liner MD   Signed by:   Margarito Liner MD on 02/16/2010   Method used:   Faxed to ...       [facility] (retail)       [address]       [place], [place]  [address]       Ph: [phone]  Fax: [phone]   RxID:   [id number] TRAMADOL HCL 50 MG TABS (TRAMADOL HCL) 1 - 2  tabs by mouth q 6-8 hrs as needed for severe pain  #240 x 0   Entered and Authorized by:   Margarito Liner MD   Signed by:   Margarito Liner MD on 02/16/2010   Method used:   Faxed to ...       [facility] (retail)       [address]       [place], [place]  [address]       Ph: [phone]       Fax: [phone]   RxID:   [id number] DIOVAN 160 MG TABS (VALSARTAN) Take 1 tablet by mouth two times a day  #62 x 9   Entered and Authorized by:   Margarito Liner MD   Signed by:   Margarito Liner MD on 02/16/2010   Method used:   Faxed to .[facility]  [facility] (retail)       [address]       [place], [place]  [address]       Ph: [phone]       Fax: [phone]   RxID:   [id number]

## 2010-05-21 NOTE — Miscellaneous (Signed)
Summary: [facility]   Imported By: Shon Hough 03/17/2010 11:45:39  _____________________________________________________________________  External Attachment:    Type:   Image     Comment:   External Document

## 2010-05-21 NOTE — Progress Notes (Signed)
Summary: med change/gp  Phone Note Refill Request Message from:  Fax from Pharmacy on May 21, 2009 10:04 AM  Refills Requested: Medication #1:  AVALIDE 300-25 MG TABS Take 1 tab by mouth once a day **Avalide has been recalled by the mfg. please change per the pharmacy.   Method Requested: Telephone to Pharmacy Initial call taken by: Chinita Pester RN,  May 21, 2009 10:04 AM  Follow-up for Phone Call        Please find out if Avapro (irbesartan) is still available. Follow-up by: Margarito Liner MD,  May 21, 2009 4:30 PM  Additional Follow-up for Phone Call Additional follow up Details #1::        Avapro is available. Additional Follow-up by: Chinita Pester RN,  May 22, 2009 1:57 PM    Additional Follow-up for Phone Call Additional follow up Details #2::    I changed Avalide 300-25 once daily to Avapro 300 mg daily and HCTZ 25 mg daily (an equivalent regimen).  Please notify patient of the change and call in the prescriptions to the pharmacy. Follow-up by: Margarito Liner MD,  May 23, 2009 11:10 AM  Additional Follow-up for Phone Call Additional follow up Details #3:: Details for Additional Follow-up Action Taken: Pt. was called about the change from Avalide to Avapro.  Rxs. faxed to [facility]. Additional Follow-up by: Chinita Pester RN,  May 23, 2009 1:51 PM  New/Updated Medications: AVAPRO 300 MG TABS (IRBESARTAN) Take 1 tablet by mouth once a day HYDROCHLOROTHIAZIDE 25 MG TABS (HYDROCHLOROTHIAZIDE) Take 1 tablet by mouth once a day  Prescriptions: AVAPRO 300 MG TABS (IRBESARTAN) Take 1 tablet by mouth once a day  #31 x 6   Entered and Authorized by:   Margarito Liner MD   Signed by:   Margarito Liner MD on 05/23/2009   Method used:   Telephoned to ...       [facility] (retail)       [address]       [place], [place]  [address]       Ph: [phone]       Fax: [phone]   RxID:   [id number] HYDROCHLOROTHIAZIDE 25  MG TABS (HYDROCHLOROTHIAZIDE) Take 1 tablet by mouth once a day  #31 x 6   Entered and Authorized by:   Margarito Liner MD   Signed by:   Margarito Liner MD on 05/23/2009   Method used:   Telephoned to ...       [facility] (retail)       [address]       [place], [place]  [address]       Ph: [phone]       Fax: [phone]   RxID:   [id number]

## 2010-05-21 NOTE — Letter (Signed)
Summary: 08-19-2009-09-17-2009/BLOOD SUGAR  08-19-2009-09-17-2009/BLOOD SUGAR   Imported By: Margie Billet 09/26/2009 08:42:25  _____________________________________________________________________  External Attachment:    Type:   Image     Comment:   External Document

## 2010-05-21 NOTE — Progress Notes (Signed)
Summary: refill/gg  Phone Note Refill Request  on October 16, 2009 10:28 AM  Refills Requested: Medication #1:  JANUVIA 50 MG TABS Take 1 tablet by mouth once a day   Last Refilled: 09/17/2009  Medication #2:  FEXOFENADINE HCL 60 MG TABS Take 1 tablet by mouth two times a day   Last Refilled: 09/17/2009 Fexofenadine is no longer available but they do have claritin or zyrtec.......Marland Kitchenwill you change?   Method Requested: Fax to Local Pharmacy Initial call taken by: Merrie Roof RN,  October 16, 2009 10:28 AM  Follow-up for Phone Call        Januvia refill faxed to pharmacy.  What is the issue with fexofenadine? Follow-up by: Margarito Liner MD,  October 16, 2009 5:20 PM  Additional Follow-up for Phone Call Additional follow up Details #1::         Fexofenadine is no longer available but they do have claritin or zyrtec.......Marland Kitchenwill you change? Additional Follow-up by: Merrie Roof RN,  October 17, 2009 10:53 AM    New/Updated Medications: CETIRIZINE HCL 10 MG TABS (CETIRIZINE HCL) Take 1 tablet by mouth once a day for seasonal allergies Prescriptions: CETIRIZINE HCL 10 MG TABS (CETIRIZINE HCL) Take 1 tablet by mouth once a day for seasonal allergies  #30 x 5   Entered and Authorized by:   Margarito Liner MD   Signed by:   Margarito Liner MD on 10/17/2009   Method used:   Faxed to ...       [facility] (retail)       [address]       [place], [place]  [address]       Ph: [phone]       Fax: [phone]   RxID:   [id number] JANUVIA 50 MG TABS (SITAGLIPTIN PHOSPHATE) Take 1 tablet by mouth once a day  #31 x 11   Entered and Authorized by:   Margarito Liner MD   Signed by:   Margarito Liner MD on 10/16/2009   Method used:   Faxed to ...       [facility] (retail)       [address]       [place], [place]  [address]       Ph: [phone]       Fax: [phone]   RxID:   [id number]

## 2010-05-21 NOTE — Progress Notes (Signed)
Summary: right leg pain  Phone Note Call from Patient Call back at Home Phone [phone]   Caller: Patient Call For: Margarito Liner MD Reason for Call: Talk to Doctor Details for Reason: right leg pain Summary of Call: Phone call from patient complaining of right leg pain. The patient seems to think it is getting worse it aching and hurting.Is there anything she can do? Initial call taken by: [facility],  March 25, 2010 10:48 AM  Follow-up for Phone Call        I called this with c/o rt  leg pain from thigh to entire leg. At night  it's hard to get to sleep because of disomfort. It feels like legs are tried from walking.   She has taken tramadol and norco without relief. Onset several months.  This has been talked about in the past. She is calling because she can't sleep at night and pain is getting worse. Is there something else pt could try or shall I schedule an appointment? Follow-up by: Merrie Roof RN,  March 25, 2010 3:37 PM  Additional Follow-up for Phone Call Additional follow up Details #1::        This was discussed back in april. Thought might be 2/2 statin. Total CK was nl.  Had nl ABI and vascular studies. I suggest that the pt stop her statin and F/U in the clinic, with Joines if possbile. If it gets worse, she needs OPC appt. Already on sleep agent. Thanks Additional Follow-up by: Blanch Media MD,  March 25, 2010 3:54 PM    Additional Follow-up for Phone Call Additional follow up Details #2::    Pt scheduled for Monday when Dr Meredith Pel will be attending.  Pt to be seen by Dr Narda Bonds Follow-up by: Merrie Roof RN,  March 25, 2010 4:14 PM

## 2010-05-21 NOTE — Procedures (Signed)
Summary: pacer [email or web address]   Current Medications (verified): 1)  Glucophage 850 Mg Tabs (Metformin Hcl) .... Take 1 Tablet By Mouth Three Times A Day 2)  Nasonex 50 Mcg/act Susp (Mometasone Furoate) .... Take 2 Sprays in Each Nostril Once A Day 3)  Aggrenox 25-200 Mg Cp12 (Aspirin-Dipyridamole) .... Take 1 Capsule By Mouth Two Times A Day 4)  Diovan 160 Mg Tabs (Valsartan) .... Take 1 Tablet By Mouth Two Times A Day 5)  Advair Diskus 250-50 Mcg/dose Misc (Fluticasone-Salmeterol) .... Inhale 1 Puff Two Times A Day 6)  Multivitamins  Tabs (Multiple Vitamin) .... Take 1 Tablet By Mouth Once A Day 7)  Bd Insulin Syringe U-100 1 Ml  Misc (Insulin Syringes (Disposable)) 8)  Calcium 600/vitamin D 600-400 Mg-Unit Tabs (Calcium Carbonate-Vitamin D) .... Take 1 Tablet By Mouth Two Times A Day 9)  Accu-Chek Compact Test Drum  Strp (Glucose Blood) .... Use To Test Blood Sugar Before & 2 Hours After Each Meal ( 5-6x Daily) 10)  Lantus Solostar 100 Unit/ml Soln (Insulin Glargine) .... Inject 38 Units Subcutaneously Each Evening 11)  Pen Needles 31g X 8 Mm Misc (Insulin Pen Needle) .... Use To Inject Insulin Once Daily 12)  Amlodipine Besylate 10 Mg Tabs (Amlodipine Besylate) .... Take 1 Tablet By Mouth Once A Day 13)  Lidoderm 5 % Ptch (Lidocaine) .... Apply One Patch To Right Chest Wall For Pain As Directed, On 12 Hours Off 12 Hours 14)  Carvedilol 12.5 Mg Tabs (Carvedilol) .... Take 1and 1/2 Tablets By Mouth Two Times A Day 15)  Proair Hfa 108 (90 Base) Mcg/act Aers (Albuterol Sulfate) .... Inhale 2 Puffs Four Times A Day As Needed For Shortness of Breath. 16)  Januvia 50 Mg Tabs (Sitagliptin Phosphate) .... Take 1 Tablet By Mouth Once A Day 17)  Spiriva Handihaler 18 Mcg Caps (Tiotropium Bromide Monohydrate) .... Inhale Contents of 1 Capsule Once A Day 18)  Cetirizine Hcl 10 Mg Tabs (Cetirizine Hcl) .... Take 1 Tablet By Mouth Once A Day As Needed Allergies 19)  Hydrochlorothiazide 25 Mg Tabs  (Hydrochlorothiazide) .... Take 1 Tablet By Mouth Once A Day 20)  Tramadol Hcl 50 Mg Tabs (Tramadol Hcl) .[name].. 1 - 2  Tabs By Mouth Q 6-8 Hrs As Needed For Severe Pain 21)  Dexilant 60 Mg Cpdr (Dexlansoprazole) .... Take As Directed 1 Capsule Each Morning 15 Minutes Before Breakfast 22)  Silenor 3 Mg Tabs (Doxepin Hcl) .... Take 1 Tablet By Mouth At Bedtime As Needed For Insomnia 23)  Hydrocodone-Acetaminophen 5-325 Mg Tabs (Hydrocodone-Acetaminophen) .... Take 1-2  Tablets By Mouth Four Times A Day As Needed For Pain 24)  Neurontin 100 Mg Caps (Gabapentin) .[name].. 1 Tablet By Mouth Three Times A Day  Allergies (verified): No Known Drug Allergies  PPM Specifications Following MD:  Lewayne Bunting, MD     PPM Vendor:  [employer]     PPM Model Number:  2110     PPM Serial Number:  [id number] PPM DOI:  08/21/2008      Lead 1    Location: RA     DOI: 04/24/2001     Model #: 1342T     Serial #: [id number]     Status: active Lead 2    Location: RV     DOI: 04/24/2001     Model #: 1336T     Serial #: [id number]     Status: active  PPM Follow Up Remote Check?  No Battery Voltage:  2.96 V  Battery Est. Longevity:  9.6 years     Pacer Dependent:  No       PPM Device Measurements [employer]  Amplitude: 3.1 mV, Impedance: 540 ohms, Threshold: 0.75 V at 0.4 msec Right Ventricle  Amplitude: 12 mV, Impedance: 490 ohms, Threshold: 1.0 V at 0.4 msec  Episodes MS Episodes:  11     Percent Mode Switch:  <1%     Coumadin:  No Ventricular High Rate:  <1%      Parameters Mode:  DDDR     Lower Rate Limit:  60     Upper Rate Limit:  120 Paced AV Delay:  200     Sensed AV Delay:  200 Tech Comments:  A-cap confirm on today.  Device function normal. Mode switch episodes 6-8 seconds in duration.  Merlin transmissions every 3 months.  ROV 7/12 with Dr. Ladona Ridgel. Altha Harm, LPN  April 22, 2010 10:12 AM

## 2010-05-21 NOTE — Progress Notes (Signed)
Summary: refill/gg  Phone Note Refill Request  on August 06, 2009 4:38 PM  Refills Requested: Medication #1:  FERROUS SULFATE 325 (65 FE) MG TABS Take 1 tablet by mouth three times a day   Last Refilled: 06/17/2009  Method Requested: Fax to Local Pharmacy Initial call taken by: Merrie Roof RN,  August 06, 2009 4:39 PM  Follow-up for Phone Call        Patient's last ferritin shows that she has replenished her iron stores.  Please advise her to stop the ferrous sulfate. Follow-up by: Margarito Liner MD,  August 07, 2009 3:01 PM  Additional Follow-up for Phone Call Additional follow up Details #1::        Pt and pharmacy informed Patient/caller verbalizes understanding of these instructions.  Additional Follow-up by: Merrie Roof RN,  August 11, 2009 2:00 PM

## 2010-05-21 NOTE — Assessment & Plan Note (Signed)
Summary: OPC RIGHT SHOULDER PAIN   Vital Signs:  Patient profile:   65 year old female BP sitting:   145 / 76  Vitals Entered By: Lillia Pauls CMA (August 13, 2009 8:51 AM)  Primary Provider:  Margarito Liner, MD   History of Present Illness: Referral for right shoulder pain of insidious onset  ~6 months ago. No prior shoulder injuries or procedures. Pain worst on overhead activities and relieved on position change and rest. No neck pain. Points to area over greater tuberosity when describing area of maximal pain. Nighttime pain only when laying on the shoulder. No paresthesias. Has tried opioids and OTC NSAID without significant pain relieved. No prior shoulder injections.  Allergies: No Known Drug Allergies  Physical Exam  General:  Well-developed,well-nourished,in no acute distress; alert,appropriate and cooperative throughout examination Msk:  NECK: FROM w/o reproduced pain.  SHOULDERS: Full flex/abd. Pain past 90 deg flex/abd on right. No relief sign on right. 30 deg ER bilaterally.  Right IR to Sacrum. Left IR to L3. 4/5 strength throughout RC testing bilaterally. (+) empty can & hawkin's on right with pain worst on empty can testing.  Neurologic:  (-) Spurlin's. Normal nv exam throughout the upper extremities.   Impression & Recommendations:  Problem # 1:  ROTATOR CUFF SYNDROME, RIGHT (ICD-726.10) Possible underlying partial RC tear.  - Will start formal physical therapy. - D/c current opioid medications. - Start tramadol. Respective medication precautions provided to the patient. - Will try to avoid NSAID and corticosteroids given comorbidities. - RTC in 4 weeks or sooner as needed for interim re-assessment. If no significant improvement, will re-visit issue of whether she desires corticosteroid injection with close blood glucose monitoring.  Complete Medication List: 1)  Glucophage 850 Mg Tabs (Metformin hcl) .... Take 1 tablet by mouth three times a  day 2)  Nexium 40 Mg Pack (Esomeprazole magnesium) .... Take 1 tablet by mouth once a day 3)  Nasonex 50 Mcg/act Susp (Mometasone furoate) .... Take 2 sprays in each nostril once a day 4)  Aggrenox 25-200 Mg Cp12 (Aspirin-dipyridamole) .... Take 1 capsule by mouth two times a day 5)  Diovan 160 Mg Tabs (Valsartan) .... Take 1 tablet by mouth two times a day 6)  Advair Diskus 250-50 Mcg/dose Misc (Fluticasone-salmeterol) .... Inhale 1 puff two times a day 7)  Multivitamins Tabs (Multiple vitamin) .... Take 1 tablet by mouth once a day 8)  Bd Insulin Syringe U-100 1 Ml Misc (Insulin syringes (disposable)) 9)  Calcium 600/vitamin D 600-400 Mg-unit Tabs (Calcium carbonate-vitamin d) .... Take 1 tablet by mouth two times a day 10)  Accu-chek Compact Test Drum Strp (Glucose blood) .... Use to test blood sugar before & 2 hours after each meal ( 5-6x daily) 11)  Lantus Solostar 100 Unit/ml Soln (Insulin glargine) .... Inject 38 units subcutaneously each evening 12)  Pen Needles 31g X 8 Mm Misc (Insulin pen needle) .... Use to inject insulin once daily 13)  Amlodipine Besylate 10 Mg Tabs (Amlodipine besylate) .... Take 1 tablet by mouth once a day 14)  Lidoderm 5 % Ptch (Lidocaine) .... Apply one patch to right chest wall for pain as directed, on 12 hours off 12 hours 15)  Carvedilol 12.5 Mg Tabs (Carvedilol) .... Take 1 tablet by mouth two times a day 16)  Proair Hfa 108 (90 Base) Mcg/act Aers (Albuterol sulfate) .... Inhale 2 puffs four times a day as needed for shortness of breath. 17)  Januvia 50 Mg Tabs (Sitagliptin phosphate) .Marland KitchenMarland KitchenMarland Kitchen  Take 1 tablet by mouth once a day 18)  Spiriva Handihaler 18 Mcg Caps (Tiotropium bromide monohydrate) .... Inhale contents of 1 capsule once a day 19)  Fexofenadine Hcl 60 Mg Tabs (Fexofenadine hcl) .... Take 1 tablet by mouth two times a day 20)  Hydrochlorothiazide 25 Mg Tabs (Hydrochlorothiazide) .... Take 1 tablet by mouth once a day 21)  Lipitor 80 Mg Tabs  (Atorvastatin calcium) .... Take 1 tablet by mouth once a day 22)  Lunesta 1 Mg Tabs (Eszopiclone) .... Take 1 tablet by mouth at bedtime as needed for insomnia 23)  Tramadol Hcl 50 Mg Tabs (Tramadol hcl) .[name].. 1 tab by mouth q 6-8 hrs as needed severe pain Prescriptions: TRAMADOL HCL 50 MG TABS (TRAMADOL HCL) 1 tab by mouth q 6-8 hrs as needed severe pain  #120 x 0   Entered and Authorized by:   Valarie Merino MD   Signed by:   Valarie Merino MD on 08/13/2009   Method used:   Faxed to ...       [facility] (retail)       [address]       [place], [place]  [address]       Ph: [phone]       Fax: [phone]   RxID:   [id number]

## 2010-05-21 NOTE — Assessment & Plan Note (Signed)
Summary: NEP/PT HAS A PACEMAKER/ST JUDE   Visit Type:  Initial Consult Primary Provider:  Margarito Liner, MD   History of Present Illness: Melissa Villa is referred today by Dr. Aleen Campi for evaluation and treatment of bradycardia s/p PPM, HTN, Obesity and dyslipidemia.  The patient has a longstanding h/o DM and HTN.  She also has sleep apnea and had recurrent episodes of  bradycardia and is s/p PPM.  She has never had syncope or c/p.  She notes that when she walks, she has pain in her legs which resolves with rest.  She has never had any sores on her legs or ulcers.  She has had longstanding obesity but notes that she has lost about 40 lbs over the past year by trying to exercise.  Current Medications (verified): 1)  Glucophage 850 Mg Tabs (Metformin Hcl) .... Take 1 Tablet By Mouth Three Times A Day 2)  Nasonex 50 Mcg/act Susp (Mometasone Furoate) .... Take 2 Sprays in Each Nostril Once A Day 3)  Aggrenox 25-200 Mg Cp12 (Aspirin-Dipyridamole) .... Take 1 Capsule By Mouth Two Times A Day 4)  Diovan 160 Mg Tabs (Valsartan) .... Take 1 Tablet By Mouth Two Times A Day 5)  Advair Diskus 250-50 Mcg/dose Misc (Fluticasone-Salmeterol) .... Inhale 1 Puff Two Times A Day 6)  Multivitamins  Tabs (Multiple Vitamin) .... Take 1 Tablet By Mouth Once A Day 7)  Bd Insulin Syringe U-100 1 Ml  Misc (Insulin Syringes (Disposable)) 8)  Calcium 600/vitamin D 600-400 Mg-Unit Tabs (Calcium Carbonate-Vitamin D) .... Take 1 Tablet By Mouth Two Times A Day 9)  Accu-Chek Compact Test Drum  Strp (Glucose Blood) .... Use To Test Blood Sugar Before & 2 Hours After Each Meal ( 5-6x Daily) 10)  Lantus Solostar 100 Unit/ml Soln (Insulin Glargine) .... Inject 38 Units Subcutaneously Each Evening 11)  Pen Needles 31g X 8 Mm Misc (Insulin Pen Needle) .... Use To Inject Insulin Once Daily 12)  Amlodipine Besylate 10 Mg Tabs (Amlodipine Besylate) .... Take 1 Tablet By Mouth Once A Day 13)  Lidoderm 5 % Ptch (Lidocaine) .... Apply  One Patch To Right Chest Wall For Pain As Directed, On 12 Hours Off 12 Hours 14)  Carvedilol 12.5 Mg Tabs (Carvedilol) .... Take 1and 1/2 Tablets By Mouth Two Times A Day 15)  Proair Hfa 108 (90 Base) Mcg/act Aers (Albuterol Sulfate) .... Inhale 2 Puffs Four Times A Day As Needed For Shortness of Breath. 16)  Januvia 50 Mg Tabs (Sitagliptin Phosphate) .... Take 1 Tablet By Mouth Once A Day 17)  Spiriva Handihaler 18 Mcg Caps (Tiotropium Bromide Monohydrate) .... Inhale Contents of 1 Capsule Once A Day 18)  Fexofenadine Hcl 180 Mg Tabs (Fexofenadine Hcl) .... Two Times A Day 19)  Hydrochlorothiazide 25 Mg Tabs (Hydrochlorothiazide) .... Take 1 Tablet By Mouth Once A Day 20)  Tramadol Hcl 50 Mg Tabs (Tramadol Hcl) .[name].. 1 - 2  Tabs By Mouth Q 6-8 Hrs As Needed For Severe Pain 21)  Dexilant 60 Mg Cpdr (Dexlansoprazole) .... Take As Directed 1 Capsule Each Morning 15 Minutes Before Breakfast 22)  Crestor 5 Mg Tabs (Rosuvastatin Calcium) .... Take 1 Tablet By Mouth Once A Day 23)  Silenor 3 Mg Tabs (Doxepin Hcl) .... Take 1 Tablet By Mouth At Bedtime As Needed For Insomnia  Allergies (verified): No Known Drug Allergies  Past History:  Past Medical History: Last updated: 12/06/2007 Congestive heart failure COPD Hypertension Transient ischemic attack, hx of Anemia-iron deficiency Breast cancer, hx of;  S/P left lumpectomy with axillary lymph node dissection 12/92, treated with Tamoxifen for 2 years; S/P right lumpectomy with axillary lymph node dissection 08/95, treated with Arimidex for 5 years; folllowed by Dr. Raymond Gurney C. Magrinat. 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 Edwyna Shell [facility] Constipation Hand Pain Palpitations Rib Pain, right Diabetes mellitus, type II Non-healing ulcer, small, at incision site of previous right thoracotomy; S/P excision of the ulceration by Dr. [name] 02/28/2006 Skin rash - recurrent legs Urinary incontinence  Past Surgical History: Last updated: 03/07/2006 Lumpectomy, left, with axillary lymph node dissection 12/92  Lumpectomy,right, with axillary lymph node dissection 8/95  Lung-lobectomy Permanent pacemaker Cholecystectomy Tonsillectomy  Family History: Last updated: 10/20/2006 No significant family history noted.   Social History: Last updated: 12/06/2007 Former Smoker Alcohol use-no Drug use-no  Has Medicare and Medicaid.  Lives alone.   Review of Systems       All systems reviewed and negative except as noted in the HPI.  Vital Signs:  Patient profile:   65 year old female Height:      64 inches Weight:      269 pounds BMI:     46.34 Pulse rate:   81 / minute BP sitting:   126 / 70  (left arm)  Vitals Entered By: Laurance Flatten CMA (October 23, 2009 3:27 PM)  Physical Exam  General:  Obese, middle aged well developed, in no acute distress.  HEENT: normal Neck: supple. No JVD. Carotids 2+ bilaterally no bruits Cor: RRR no rubs, gallops or murmur Lungs: CTA with no wheezes, rales, or rhonchi. Ab: soft, nontender. nondistended. No HSM. Good bowel sounds Ext: warm. no cyanosis, clubbing or edema Neuro: alert and oriented. Grossly nonfocal. affect pleasant    PPM Specifications Following MD:  Lewayne Bunting, MD     PPM Vendor:  [employer]     PPM Model Number:  2110     PPM Serial Number:  [id number] PPM DOI:  08/21/2008      Lead 1    Location: [facility]     DOI: 04/24/2001     Model #: 1342T     Serial #: [id number]     Status: active Lead 2    Location: RV     DOI: 04/24/2001     Model #: 1336T     Serial #: [id number]     Status: active  PPM Follow Up Battery Voltage:  2.98 V     Battery Est. Longevity:   10.3-11.5 YRS       PPM Device Measurements Atrium  Amplitude: 3.2 mV, Impedance: 540 ohms, Threshold: 0.5 V at 0.4 msec Right Ventricle  Amplitude: 12.0 mV, Impedance: 490 ohms, Threshold: 1.25 V at 0.4 msec  Episodes MS Episodes:  40     Percent Mode Switch:  <1%     Ventricular High Rate:  0     Atrial Pacing:  6.4%      Parameters Mode:  DDDR     Lower Rate Limit:  60     Upper Rate Limit:  120 Paced AV Delay:  200     Sensed AV Delay:  200 Tech Comments:  NORMAL DEVICE FUNCTION.  NO CHANGES MADE.  MODE SWITCHES NOT REAL--RETROGRADE CONDUCTION.  PVAB SET AT 120bpm.  ROV  IN 6 MTHS. Vella Kohler MD Comments:  Agree with above.  Impression & Recommendations:  Problem # 1:  INTERMITTENT CLAUDICATION, BILATERAL (ICD-443.9) The patient has multiple risk factors for peripheral vascular disease.  I would recommend dopplers of the lower extremities to assess ABI's.  Will schedule.  Problem # 2:  HYPERTENSION (ICD-401.9) Her blood pressure is fairly well controlled.  Will continue her current meds, ecourage exercise as she tolerates and maintain a low sodium diet. Her updated medication list for this problem includes:    Diovan 160 Mg Tabs (Valsartan) .[name]... Take 1 tablet by mouth two times a day    Amlodipine Besylate 10 Mg Tabs (Amlodipine besylate) .[name]... Take 1 tablet by mouth once a day    Carvedilol 12.5 Mg Tabs (Carvedilol) .[name]... Take 1and 1/2 tablets by mouth two times a day    Hydrochlorothiazide 25 Mg Tabs (Hydrochlorothiazide) .[name]... Take 1 tablet by mouth once a day  Problem # 3:  CONGESTIVE HEART FAILURE (ICD-428.0) She has a h/o diastolic CHF which is class 1-2, mainly limited by claudication. Continue meds as below. Her updated medication list for this problem includes:    Aggrenox 25-200 Mg Cp12 (Aspirin-dipyridamole) .[name]... Take 1 capsule by mouth two times a day    Diovan 160 Mg Tabs (Valsartan) .[name]... Take 1 tablet by mouth two times a day    Amlodipine Besylate 10 Mg Tabs  (Amlodipine besylate) .[name]... Take 1 tablet by mouth once a day    Carvedilol 12.5 Mg Tabs (Carvedilol) .[name]... Take 1and 1/2 tablets by mouth two times a day    Hydrochlorothiazide 25 Mg Tabs (Hydrochlorothiazide) .[name]... Take 1 tablet by mouth once a day  Problem # 4:  PACEMAKER, PERMANENT (ICD-V45.01) Her device is working normally.  will recheck in several months.  Other Orders: Arterial Duplex Lower Extremity (Arterial Duplex Low)  Patient Instructions: 1)  Your physician wants you to follow-up in: 6 months with device clinic and 12 months wtih Dr Ladona Ridgel.  You will receive a reminder letter in the mail two months in advance. If you don't receive a letter, please call our office to schedule the follow-up appointment. 2)  Your physician recommends that you continue on your current medications as directed. Please refer to the Current Medication list given to you today. 3)  Your physician has requested that you have a lower or upper extremity arterial duplex.  This test is an ultrasound of the arteries in the legs or arms.  It looks at arterial blood flow in the legs and arms.  Allow one hour for Lower and Upper Arterial scans. There are no restrictions or special instructions.

## 2010-05-21 NOTE — Progress Notes (Signed)
Summary: med change/gp  Phone Note Refill Request Message from:  Fax from Pharmacy on May 26, 2009 10:07 AM  Refills Requested: Medication #1:  AVAPRO 300 MG TABS Take 1 tablet by mouth once a day Pt.'s insurance will not cover Avapro but it will cover Losartin, Benicar or Diovan.   Method Requested: Fax to Local Pharmacy Initial call taken by: Chinita Pester RN,  May 26, 2009 10:07 AM  Follow-up for Phone Call        I changed from Avapro to Diovan 160 mg daily.  Please call in the change to the pharmacy, and also inform the patient. Follow-up by: Margarito Liner MD,  May 26, 2009 4:50 PM  Additional Follow-up for Phone Call Additional follow up Details #1::        New Diovan Rx called to [facility]  and they will call Ms. Arvelo. Additional Follow-up by: Chinita Pester RN,  May 27, 2009 9:45 AM    New/Updated Medications: DIOVAN 160 MG TABS (VALSARTAN) Take 1 tablet by mouth once a day Prescriptions: DIOVAN 160 MG TABS (VALSARTAN) Take 1 tablet by mouth once a day  #31 x 6   Entered and Authorized by:   Margarito Liner MD   Signed by:   Margarito Liner MD on 05/26/2009   Method used:   Telephoned to ...       [facility] (retail)       [address]       [place], [place]  [address]       Ph: [phone]       Fax: [phone]   RxID:   [id number]

## 2010-05-21 NOTE — Miscellaneous (Signed)
[facility]   Imported By: Margie Billet 10/15/2009 15:05:29  _____________________________________________________________________  External Attachment:    Type:   Image     Comment:   External Document

## 2010-05-21 NOTE — Miscellaneous (Signed)
Summary: [facility]  [facility]   Imported By: Marily Memos 09/17/2009 16:14:24  _____________________________________________________________________  External Attachment:    Type:   Image     Comment:   External Document

## 2010-05-21 NOTE — Cardiovascular Report (Signed)
Summary: Office Visit   Office Visit   Imported By: Roderic Ovens 10/27/2009 11:00:19  _____________________________________________________________________  External Attachment:    Type:   Image     Comment:   External Document

## 2010-05-21 NOTE — Letter (Signed)
Summary: [facility]  [facility]   Imported By: Marylou Mccoy 11/10/2009 18:28:27  _____________________________________________________________________  External Attachment:    Type:   Image     Comment:   External Document

## 2010-05-21 NOTE — Cardiovascular Report (Signed)
Summary: Office Visit   Office Visit   Imported By: Roderic Ovens 05/08/2010 16:13:15  _____________________________________________________________________  External Attachment:    Type:   Image     Comment:   External Document

## 2010-05-21 NOTE — Medication Information (Signed)
Summary: CPAP  CPAP   Imported By: Florinda Marker 05/22/2009 09:06:48  _____________________________________________________________________  External Attachment:    Type:   Image     Comment:   External Document

## 2010-05-21 NOTE — Progress Notes (Signed)
Summary: med refill/gp  Phone Note Refill Request Message from:  Fax from Pharmacy on November 11, 2009 11:09 AM  Refills Requested: Medication #1:  SILENOR 3 MG TABS Take 1 tablet by mouth at bedtime as needed for insomnia.   Last Refilled: 09/18/2009  Medication #2:  TRAMADOL HCL 50 MG TABS 1 - 2  tabs by mouth q 6-8 hrs as needed for severe pain   Last Refilled: 10/08/2009 Last appt. June 1.   Method Requested: Telephone to Pharmacy Initial call taken by: Chinita Pester RN,  November 11, 2009 11:10 AM  Follow-up for Phone Call        Rx faxed to pharmacy. Follow-up by: Margarito Liner MD,  November 11, 2009 1:53 PM    Prescriptions: TRAMADOL HCL 50 MG TABS (TRAMADOL HCL) 1 - 2  tabs by mouth q 6-8 hrs as needed for severe pain  #240 x 0   Entered and Authorized by:   Margarito Liner MD   Signed by:   Margarito Liner MD on 11/11/2009   Method used:   Faxed to ...       [facility] (retail)       [address]       [place], [place]  [address]       Ph: [phone]       Fax: [phone]   RxID:   [id number] SILENOR 3 MG TABS (DOXEPIN HCL) Take 1 tablet by mouth at bedtime as needed for insomnia  #30 x 3   Entered and Authorized by:   Margarito Liner MD   Signed by:   Margarito Liner MD on 11/11/2009   Method used:   Faxed to ...       [facility] (retail)       [address]       [place], [place]  [address]       Ph: [phone]       Fax: [phone]   RxID:   [id number]

## 2010-05-21 NOTE — Letter (Signed)
Summary: [facility]  [facility]   Imported By: Marily Memos 08/13/2009 11:58:17  _____________________________________________________________________  External Attachment:    Type:   Image     Comment:   External Document

## 2010-05-21 NOTE — Progress Notes (Signed)
Summary: refill/ hla  Phone Note Refill Request Message from:  Fax from Pharmacy on March 17, 2010 4:49 PM  Refills Requested: Medication #1:  LANTUS SOLOSTAR 100 UNIT/ML SOLN inject 38 units subcutaneously each evening   Dosage confirmed as above?Dosage Confirmed   Last Refilled: 10/29  Medication #2:  PEN NEEDLES 31G X 8 MM MISC use to inject insulin once daily   Dosage confirmed as above?Dosage Confirmed  Medication #3:  NASONEX 50 MCG/ACT SUSP Take 2 sprays in each nostril once a day   Dosage confirmed as above?Dosage Confirmed   Last Refilled: 11/25 last visit 11/2009  Initial call taken by: Marin Roberts RN,  March 17, 2010 4:49 PM  Follow-up for Phone Call        Rx faxed to pharmacy. Follow-up by: Margarito Liner MD,  March 17, 2010 6:13 PM    Prescriptions: PEN NEEDLES 31G X 8 MM MISC (INSULIN PEN NEEDLE) use to inject insulin once daily  #100 x 3   Entered and Authorized by:   Margarito Liner MD   Signed by:   Margarito Liner MD on 03/17/2010   Method used:   Faxed to ...       [facility] (retail)       [address]       [place], [place]  [address]       Ph: [phone]       Fax: [phone]   RxID:   [id number] LANTUS SOLOSTAR 100 UNIT/ML SOLN (INSULIN GLARGINE) inject 38 units subcutaneously each evening  #15 mL x 11   Entered and Authorized by:   Margarito Liner MD   Signed by:   Margarito Liner MD on 03/17/2010   Method used:   Faxed to ...       [facility] (retail)       [address]       [place], [place]  [address]       Ph: [phone]       Fax: [phone]   RxID:   [id number] NASONEX 50 MCG/ACT SUSP (MOMETASONE FUROATE) Take 2 sprays in each nostril once a day  #1 x 11   Entered and Authorized by:   Margarito Liner MD   Signed by:   Margarito Liner MD on 03/17/2010   Method used:   Faxed to ...       [facility] (retail)       [address]       [place], [place]  [address]       Ph: [phone]       Fax: [phone]   RxID:   [id number]

## 2010-05-21 NOTE — Medication Information (Signed)
Summary: [facility]/ 09-04-2009  [facility]/ 09-04-2009   Imported By: Gentry Fitz 10/14/2009 14:16:05  _____________________________________________________________________  External Attachment:    Type:   Image     Comment:   External Document

## 2010-05-21 NOTE — Miscellaneous (Signed)
Summary: [facility]   Imported By: Shon Hough 01/01/2010 16:44:41  _____________________________________________________________________  External Attachment:    Type:   Image     Comment:   External Document

## 2010-05-21 NOTE — Progress Notes (Signed)
Summary: Refill/gh  Phone Note Refill Request Message from:  Fax from Pharmacy on April 14, 2010 11:39 AM  Refills Requested: Medication #1:  ADVAIR DISKUS 250-50 MCG/DOSE MISC Inhale 1 puff two times a day   Last Refilled: 03/13/2010  Medication #2:  CRESTOR 5 MG TABS Take 1 tablet by mouth once a day   Last Refilled: 03/13/2010  Medication #3:  CARVEDILOL 12.5 MG TABS Take 1and 1/2 tablets by mouth two times a day   Last Refilled: 03/13/2010  Medication #4:  FEXOFENADINE HCL 180 MG TABS two times a day Last office visit and labs were 03/30/2010.  Pt is on Cetirizine HCL takes the place of the Fexofenaine.  Last labs were 07/31/2009.  Last seen 03/30/2010.   Method Requested: Electronic Initial call taken by: Angelina Ok RN,  April 14, 2010 11:39 AM  Follow-up for Phone Call        Refill approved-nurse to complete Follow-up by: Julaine Fusi  DO,  April 14, 2010 3:06 PM    New/Updated Medications: CETIRIZINE HCL 10 MG TABS (CETIRIZINE HCL) Take 1 tablet by mouth once a day as needed allergies Prescriptions: CETIRIZINE HCL 10 MG TABS (CETIRIZINE HCL) Take 1 tablet by mouth once a day as needed allergies  #30 x 6   Entered by:   Julaine Fusi  DO   Authorized by:   [name] [facility] ATTENDING DESKTOP   Signed by:   Julaine Fusi  DO on 04/14/2010   Method used:   Faxed to ...       [facility] (retail)       [address]       [place], [place]  [address]       Ph: [phone]       Fax: [phone]   RxID:   [id number] CRESTOR 5 MG TABS (ROSUVASTATIN CALCIUM) Take 1 tablet by mouth once a day  #30 x 6   Entered by:   Julaine Fusi  DO   Authorized by:   [name] [facility] ATTENDING DESKTOP   Signed by:   Julaine Fusi  DO on 04/14/2010   Method used:   Faxed to ...       [facility] (retail)       [address]       [place], [place]  [address]       Ph: [phone]       Fax: [phone]   RxID:    [id number] CARVEDILOL 12.5 MG TABS (CARVEDILOL) Take 1and 1/2 tablets by mouth two times a day  #90 x 6   Entered by:   Julaine Fusi  DO   Authorized by:   [name] OPC ATTENDING DESKTOP   Signed by:   Julaine Fusi  DO on 04/14/2010   Method used:   Faxed to ...       [facility] (retail)       [address]       [place], [place]  [address]       Ph: [phone]       Fax: [phone]   RxID:   [id number] ADVAIR DISKUS 250-50 MCG/DOSE MISC (FLUTICASONE-SALMETEROL) Inhale 1 puff two times a day  #1 x 11   Entered by:   Julaine Fusi  DO   Authorized by:   [name] [facility] ATTENDING DESKTOP  Signed by:   Julaine Fusi  DO on 04/14/2010   Method used:   Faxed to ...       [facility] (retail)       [address]       [place], [place]  [address]       Ph: [phone]       Fax: [phone]   RxID:   [id number]

## 2010-05-21 NOTE — Assessment & Plan Note (Signed)
Summary: EST-6 WEEK RECHECK/CH   Vital Signs:  Patient profile:   65 year old female Height:      64 inches Weight:      268.4 pounds BMI:     46.24 Temp:     96.8 degrees F oral Pulse rate:   62 / minute BP sitting:   141 / 84  (right arm)  Vitals Entered By: Filomena Jungling NT II (September 17, 2009 8:43 AM) CC:  6 week checkup stii not sleeping Is Patient Diabetic? Yes Did you bring your meter with you today? Yes Pain Assessment Patient in pain? yes     Location: arms, Intensity: 9 Type: aching Nutritional Status BMI of > 30 = obese CBG Result 130  Have you ever been in a relationship where you felt threatened, hurt or afraid?No   Does patient need assistance? Functional Status Self care Ambulation Normal   Primary Care Provider:  Margarito Liner, MD  CC:   6 week checkup stii not sleeping.  History of Present Illness: Patient returns for follow-uo of her diabetes mellitus, hypertension, hyperlipidemia, and other chronic medical problems.  She has no new complaints today.  She saw Dr. Fredric Mare at [facility] for her shoulder pain; he referred her for physical therapy, and changed her pain medication to tramadol.  She reports no improvement in her shoulder pain.  She saw Dr Elnoria Howard for evaluation of her abdominal pain, and he changed her PPI to Dexilant and advised that she eat smaller meals.  She reports that her GI symptoms are much better.  She reports that she is compliant with her medications.  Her blood sugars have been reasonably well controlled (see glucose meter record).  Preventive Screening-Counseling & Management  Alcohol-Tobacco     Smoking Status: quit     Year Quit: 2002  Caffeine-Diet-Exercise     Does Patient Exercise: no  Pap Smear  Procedure date:  07/30/2009  Findings:      STATEMENT Of SPECIMEN ADEQUACY: Satisfactory for evaluation Endocervical/Transformation zone component PRESENT. INTERPRETATION: NEGATIVE FOR INTRAEPITHELIAL LESIONS OR  MALIGNANCY.  Procedures Next Due Date:    Pap Smear: 07/2010  Current Medications (verified): 1)  Glucophage 850 Mg Tabs (Metformin Hcl) .... Take 1 Tablet By Mouth Three Times A Day 2)  Nasonex 50 Mcg/act Susp (Mometasone Furoate) .... Take 2 Sprays in Each Nostril Once A Day 3)  Aggrenox 25-200 Mg Cp12 (Aspirin-Dipyridamole) .... Take 1 Capsule By Mouth Two Times A Day 4)  Diovan 160 Mg Tabs (Valsartan) .... Take 1 Tablet By Mouth Two Times A Day 5)  Advair Diskus 250-50 Mcg/dose Misc (Fluticasone-Salmeterol) .... Inhale 1 Puff Two Times A Day 6)  Multivitamins  Tabs (Multiple Vitamin) .... Take 1 Tablet By Mouth Once A Day 7)  Bd Insulin Syringe U-100 1 Ml  Misc (Insulin Syringes (Disposable)) 8)  Calcium 600/vitamin D 600-400 Mg-Unit Tabs (Calcium Carbonate-Vitamin D) .... Take 1 Tablet By Mouth Two Times A Day 9)  Accu-Chek Compact Test Drum  Strp (Glucose Blood) .... Use To Test Blood Sugar Before & 2 Hours After Each Meal ( 5-6x Daily) 10)  Lantus Solostar 100 Unit/ml Soln (Insulin Glargine) .... Inject 38 Units Subcutaneously Each Evening 11)  Pen Needles 31g X 8 Mm Misc (Insulin Pen Needle) .... Use To Inject Insulin Once Daily 12)  Amlodipine Besylate 10 Mg Tabs (Amlodipine Besylate) .... Take 1 Tablet By Mouth Once A Day 13)  Lidoderm 5 % Ptch (Lidocaine) .... Apply One Patch To Right  Chest Wall For Pain As Directed, On 12 Hours Off 12 Hours 14)  Carvedilol 12.5 Mg Tabs (Carvedilol) .... Take 1 Tablet By Mouth Two Times A Day 15)  Proair Hfa 108 (90 Base) Mcg/act Aers (Albuterol Sulfate) .... Inhale 2 Puffs Four Times A Day As Needed For Shortness of Breath. 16)  Januvia 50 Mg Tabs (Sitagliptin Phosphate) .... Take 1 Tablet By Mouth Once A Day 17)  Spiriva Handihaler 18 Mcg Caps (Tiotropium Bromide Monohydrate) .... Inhale Contents of 1 Capsule Once A Day 18)  Fexofenadine Hcl 60 Mg Tabs (Fexofenadine Hcl) .... Take 1 Tablet By Mouth Two Times A Day 19)  Hydrochlorothiazide 25 Mg  Tabs (Hydrochlorothiazide) .... Take 1 Tablet By Mouth Once A Day 20)  Lipitor 80 Mg Tabs (Atorvastatin Calcium) .... Take 1 Tablet By Mouth Once A Day 21)  Tramadol Hcl 50 Mg Tabs (Tramadol Hcl) .[name].. 1 Tab By Mouth Q 6-8 Hrs As Needed Severe Pain 22)  Dexilant 60 Mg Cpdr (Dexlansoprazole) .... Take As Directed 1 Capsule Each Morning 15 Minutes Before Breakfast  Allergies (verified): No Known Drug Allergies  Review of Systems General:  Complains of fatigue; denies weight loss. CV:  Denies chest pain or discomfort and swelling of feet. Resp:  Denies shortness of breath. GI:  Denies abdominal pain, nausea, and vomiting. GU:  Denies dysuria and urinary frequency. MS:  Complains of muscle aches. Psych:  Denies anxiety and depression.  Physical Exam  General:  alert, no distress Lungs:  normal respiratory effort, normal breath sounds, no crackles, and no wheezes.   Heart:  normal rate, regular rhythm, 1/6 systolic ejection murmur; no gallop, and no rub.   Extremities:  no edema   Impression & Recommendations:  Problem # 1:  DIABETES MELLITUS, TYPE II (ICD-250.00) Patient's diabetes mellitus is well controlled on current regimen.  Plan is to continue current medications, and continue home capillary blood glucose monitoring.  Patient reports that she recently saw her ophthalmologist; will request copy of his report.  Her updated medication list for this problem includes:    Glucophage 850 Mg Tabs (Metformin hcl) .[name]... Take 1 tablet by mouth three times a day    Diovan 160 Mg Tabs (Valsartan) .[name]... Take 1 tablet by mouth two times a day    Lantus Solostar 100 Unit/ml Soln (Insulin glargine) ..... Inject 38 units subcutaneously each evening    Januvia 50 Mg Tabs (Sitagliptin phosphate) .[name]... Take 1 tablet by mouth once a day  Labs Reviewed: Creat: 0.74 (07/31/2009)     Last Eye Exam: Proliferative diabetic retinopathy OU - stable. Exam by Alford Highland. Rankin, MD (11/20/2008) Reviewed  HgBA1c results: 6.8 (09/17/2009)  7.0 (06/18/2009)  Orders: T-Hgb A1C (in-house) ([id number]) T- Capillary Blood Glucose (13086)  Problem # 2:  HYPERCHOLESTEROLEMIA (ICD-272.0) Patient reports ongoing problems with muscles aches, especially in her thighs.  Although her total CK was not elevated when checked at her last visit, I am concerned that her myalgias may be a statin side effect.  Plan is to switch to a low dose of Crestor (5 mg daily) and recheck a lipid panel in 3 months.  The following medications were removed from the medication list:    Lipitor 80 Mg Tabs (Atorvastatin calcium) .[name]... Take 1 tablet by mouth once a day Her updated medication list for this problem includes:    Crestor 5 Mg Tabs (Rosuvastatin calcium) .[name]... Take 1 tablet by mouth once a day  Labs Reviewed: SGOT: 14 (07/31/2009)   SGPT:  17 (07/31/2009)  Prior 10 Yr Risk Heart Disease: 24 % (11/24/2006)   HDL:27 (07/31/2009), 29 (12/13/2008)  LDL:74 (07/31/2009), 68 (12/13/2008)  Chol:118 (07/31/2009), 112 (12/13/2008)  Trig:86 (07/31/2009), 76 (12/13/2008)  Problem # 3:  HYPERTENSION (ICD-401.9) Blood pressure is mildly elevated.  Plan is to increase carvedilol 12.5 mg to a dose of 1and 1/2 tablets two times a day.  Her updated medication list for this problem includes:    Diovan 160 Mg Tabs (Valsartan) .[name]... Take 1 tablet by mouth two times a day    Amlodipine Besylate 10 Mg Tabs (Amlodipine besylate) .[name]... Take 1 tablet by mouth once a day    Carvedilol 12.5 Mg Tabs (Carvedilol) .[name]... Take 1and 1/2 tablets by mouth two times a day    Hydrochlorothiazide 25 Mg Tabs (Hydrochlorothiazide) .[name]... Take 1 tablet by mouth once a day  BP today: 141/84 Prior BP: 145/76 (08/13/2009)  Prior 10 Yr Risk Heart Disease: 24 % (11/24/2006)  Labs Reviewed: K+: 4.5 (07/31/2009) Creat: : 0.74 (07/31/2009)   Chol: 118 (07/31/2009)   HDL: 27 (07/31/2009)   LDL: 74 (07/31/2009)   TG: 86 (07/31/2009)  Problem # 4:  SHOULDER PAIN,  RIGHT (ICD-719.41) Patient was seen at Sports Medicine and referred for physical therapy.  She reports that the shoulder exercises have not helped her shoulder pain.  She has follow-up scheduled at the Sports Medicine clinic today.  She was switched from hydrocodone/acetaminophen to tramadol for pain and reports comparable pain relief on the tramadol.  Her updated medication list for this problem includes:    Tramadol Hcl 50 Mg Tabs (Tramadol hcl) .[name]... 1 tab by mouth q 6-8 hrs as needed severe pain  Problem # 5:  INSOMNIA (ICD-780.52) Patient reports that Lunesta did not help her insomnia; she still awakens after 30 - 60 minutes of sleep and cannot go back to sleep.  This is a significant problem and she feels that it is largely responsible for her daytime fatigue.  The plan is to start low-dose doxepin at 3 mg at bedtime as needed for insomnia; I discussed potential side-effects and advised her to call if she has any problems.  The following medications were removed from the medication list:    Lunesta 1 Mg Tabs (Eszopiclone) .[name]... Take 1 tablet by mouth at bedtime as needed for insomnia Her updated medication list for this problem includes:    Silenor 3 Mg Tabs (Doxepin hcl) .[name]... Take 1 tablet by mouth at bedtime as needed for insomnia  Problem # 6:  CONGESTIVE HEART FAILURE (ICD-428.0) Symptoms are well-compensated on current medication regimen.  Her updated medication list for this problem includes:    Aggrenox 25-200 Mg Cp12 (Aspirin-dipyridamole) .[name]... Take 1 capsule by mouth two times a day    Diovan 160 Mg Tabs (Valsartan) .[name]... Take 1 tablet by mouth two times a day    Carvedilol 12.5 Mg Tabs (Carvedilol) .[name]... Take 1and 1/2 tablets by mouth two times a day    Hydrochlorothiazide 25 Mg Tabs (Hydrochlorothiazide) .[name]... Take 1 tablet by mouth once a day  Problem # 7:  OBSTRUCTIVE SLEEP APNEA (ICD-327.23) Patient reports no problems with her CPAP following mask replacement.  Problem  # 8:  COPD (ICD-496) Patient is doing well on her current inhaler regimen.  Her updated medication list for this problem includes:    Advair Diskus 250-50 Mcg/dose Misc (Fluticasone-salmeterol) ..... Inhale 1 puff two times a day    Proair Hfa 108 (90 Base) Mcg/act Aers (Albuterol sulfate) .Marland KitchenMarland KitchenMarland KitchenMarland Kitchen  Inhale 2 puffs four times a day as needed for shortness of breath.    Spiriva Handihaler 18 Mcg Caps (Tiotropium bromide monohydrate) ..... Inhale contents of 1 capsule once a day  Complete Medication List: 1)  Glucophage 850 Mg Tabs (Metformin hcl) .... Take 1 tablet by mouth three times a day 2)  Nasonex 50 Mcg/act Susp (Mometasone furoate) .... Take 2 sprays in each nostril once a day 3)  Aggrenox 25-200 Mg Cp12 (Aspirin-dipyridamole) .... Take 1 capsule by mouth two times a day 4)  Diovan 160 Mg Tabs (Valsartan) .... Take 1 tablet by mouth two times a day 5)  Advair Diskus 250-50 Mcg/dose Misc (Fluticasone-salmeterol) .... Inhale 1 puff two times a day 6)  Multivitamins Tabs (Multiple vitamin) .... Take 1 tablet by mouth once a day 7)  Bd Insulin Syringe U-100 1 Ml Misc (Insulin syringes (disposable)) 8)  Calcium 600/vitamin D 600-400 Mg-unit Tabs (Calcium carbonate-vitamin d) .... Take 1 tablet by mouth two times a day 9)  Accu-chek Compact Test Drum Strp (Glucose blood) .... Use to test blood sugar before & 2 hours after each meal ( 5-6x daily) 10)  Lantus Solostar 100 Unit/ml Soln (Insulin glargine) .... Inject 38 units subcutaneously each evening 11)  Pen Needles 31g X 8 Mm Misc (Insulin pen needle) .... Use to inject insulin once daily 12)  Amlodipine Besylate 10 Mg Tabs (Amlodipine besylate) .... Take 1 tablet by mouth once a day 13)  Lidoderm 5 % Ptch (Lidocaine) .... Apply one patch to right chest wall for pain as directed, on 12 hours off 12 hours 14)  Carvedilol 12.5 Mg Tabs (Carvedilol) .... Take 1and 1/2 tablets by mouth two times a day 15)  Proair Hfa 108 (90 Base) Mcg/act Aers  (Albuterol sulfate) .... Inhale 2 puffs four times a day as needed for shortness of breath. 16)  Januvia 50 Mg Tabs (Sitagliptin phosphate) .... Take 1 tablet by mouth once a day 17)  Spiriva Handihaler 18 Mcg Caps (Tiotropium bromide monohydrate) .... Inhale contents of 1 capsule once a day 18)  Fexofenadine Hcl 60 Mg Tabs (Fexofenadine hcl) .... Take 1 tablet by mouth two times a day 19)  Hydrochlorothiazide 25 Mg Tabs (Hydrochlorothiazide) .... Take 1 tablet by mouth once a day 20)  Tramadol Hcl 50 Mg Tabs (Tramadol hcl) .[name].. 1 tab by mouth q 6-8 hrs as needed severe pain 21)  Dexilant 60 Mg Cpdr (Dexlansoprazole) .... Take as directed 1 capsule each morning 15 minutes before breakfast 22)  Crestor 5 Mg Tabs (Rosuvastatin calcium) .... Take 1 tablet by mouth once a day 23)  Silenor 3 Mg Tabs (Doxepin hcl) .... Take 1 tablet by mouth at bedtime as needed for insomnia  Patient Instructions: 1)  Please schedule a follow-up appointment in 1 month. 2)  Stop Lipitor (atorvastatin). 3)  Start Crestor (rosuvastatin) 5 mg one tablet daily. 4)  Increse Coreg (carvedilol) 12.5 mg to a dose of 1 and 1/2 tablets two times a day. 5)  Take doxepin 3 mg at bedtime as needed for insomnia.  Prescriptions: SILENOR 3 MG TABS (DOXEPIN HCL) Take 1 tablet by mouth at bedtime as needed for insomnia  #30 x 0   Entered and Authorized by:   Margarito Liner MD   Signed by:   Margarito Liner MD on 09/17/2009   Method used:   Faxed to ...       [facility] (retail)       [address]  [place], [place]  [address]       Ph: [phone]       Fax: [phone]   RxID:   [id number] CRESTOR 5 MG TABS (ROSUVASTATIN CALCIUM) Take 1 tablet by mouth once a day  #30 x 6   Entered and Authorized by:   Margarito Liner MD   Signed by:   Margarito Liner MD on 09/17/2009   Method used:   Faxed to ...       [facility] (retail)       [address]       [place], [place]  [address]       Ph: [phone]       Fax: [phone]   RxID:   [id number] CARVEDILOL 12.5 MG TABS (CARVEDILOL) Take 1and 1/2 tablets by mouth two times a day  #90 x 6   Entered and Authorized by:   Margarito Liner MD   Signed by:   Margarito Liner MD on 09/17/2009   Method used:   Faxed to ...       [facility] (retail)       [address]       [place], [place]  [address]       Ph: [phone]       Fax: [phone]   RxID:   [id number]   Prevention & Chronic Care Immunizations   Influenza vaccine: Fluvax 3+  (02/12/2009)   Influenza vaccine due: 12/18/2009    Tetanus booster: 02/12/2009: Tdap   Tetanus booster due: 02/13/2019    Pneumococcal vaccine: Pneumovax  (02/01/2001)   Pneumococcal vaccine due: 09/10/2012    H. zoster vaccine: Not documented  Colorectal Screening   Hemoccult: Negative X3  (05/01/2009)   Hemoccult action/deferral: Ordered  (04/23/2009)   Hemoccult due: 12/19/2008    Colonoscopy: Normal  (02/13/2004)   Colonoscopy due: 02/12/2014  Other Screening   Pap smear: STATEMENT Of SPECIMEN ADEQUACY: Satisfactory for evaluation Endocervical/Transformation zone component PRESENT. INTERPRETATION: NEGATIVE FOR INTRAEPITHELIAL LESIONS OR MALIGNANCY.  (07/30/2009)   Pap smear action/deferral: GYN Referral  (06/18/2009)   Pap smear due: 07/2010    Mammogram: Assessment: BIRADS 1: Negative   Location: [facility]   (01/08/2009)   Mammogram due: 01/2010    DXA bone density scan: AP Spine L1-L4:  T-score -1.4 (Osteopenia) Dual Femur Neck Left:  T-score -0.6 (Normal) Dual Femur Neck Right: T-score -1.0 (Normal)    (10/04/2007)   DXA scan due: None   Reports requested:  Smoking status: quit  (09/17/2009)    Screening comments: Had Pap in April at [facility].  Diabetes Mellitus   HgbA1C: 6.8  (09/17/2009)   HgbA1C action/deferral: Ordered  (12/11/2008)   Hemoglobin A1C due:  05/27/2007    Eye exam: Proliferative diabetic retinopathy OU - stable. Exam by Alford Highland. Rankin, MD  (11/20/2008)   Last eye exam report requested.   Eye exam due: 02/2009    Foot exam: yes  (12/11/2008)   Foot exam action/deferral: Do today   High risk foot: No  (12/11/2008)   Foot care education: Done  (12/11/2008)   Foot exam due: 12/11/2009    Urine microalbumin/creatinine ratio: 1158.1  (06/18/2009)   Urine microalbumin action/deferral: Ordered   Urine microalbumin/cr due: 11/24/2007    Diabetes flowsheet reviewed?: Yes   Progress toward A1C goal: At goal  Diabetes comments: Recently saw Dr. Luciana Axe  Lipids   Total Cholesterol: 118  (07/31/2009)   LDL: 74  (07/31/2009)   LDL Direct: Not documented   HDL: 27  (07/31/2009)   Triglycerides: 86  (07/31/2009)    SGOT (AST): 14  (07/31/2009)   SGPT (ALT): 17  (07/31/2009)   Alkaline phosphatase: 59  (07/31/2009)   Total bilirubin: 0.3  (07/31/2009)    Lipid flowsheet reviewed?: Yes   Progress toward LDL goal: At goal  Hypertension   Last Blood Pressure: 141 / 84  (09/17/2009)   Serum creatinine: 0.74  (07/31/2009)   Serum potassium 4.5  (07/31/2009)    Hypertension flowsheet reviewed?: Yes   Progress toward BP goal: Unchanged  Self-Management Support :   Personal Goals (by the next clinic visit) :     Personal A1C goal: 7  (12/11/2008)     Personal blood pressure goal: 130/80  (12/11/2008)     Personal LDL goal: 100  (12/11/2008)    Patient will work on the following items until the next clinic visit to reach self-care goals:     Medications and monitoring: take my medicines every day, check my blood sugar, examine my feet every day  (09/17/2009)     Eating: drink diet soda or water instead of juice or soda, eat more vegetables, use fresh or frozen vegetables, eat foods that are low in salt, eat baked foods instead of fried foods, eat fruit for snacks and desserts  (09/17/2009)     Activity: park at the far end of  the parking lot  (09/17/2009)     Home glucose monitoring frequency: 3 times a day  (09/17/2009)    Diabetes self-management support: Written self-care plan  (09/17/2009)   Diabetes care plan printed   Last diabetes self-management training by diabetes educator: 10/31/2007    Hypertension self-management support: Written self-care plan  (09/17/2009)   Hypertension self-care plan printed.    Lipid self-management support: Written self-care plan  (09/17/2009)   Lipid self-care plan printed.   Nursing Instructions: Request report of last diabetic eye exam     Laboratory Results   Blood Tests   Date/Time Received: September 17, 2009 9:07 AM  Date/Time Reported: Burke Keels  September 17, 2009 9:07 AM   HGBA1C: 6.8%   (Normal Range: Non-Diabetic - 3-6%   Control Diabetic - 6-8%) CBG Random:: 130mg /dL

## 2010-05-21 NOTE — Letter (Signed)
Summary: [facility]   Imported By: Marylou Mccoy 11/10/2009 18:31:05  _____________________________________________________________________  External Attachment:    Type:   Image     Comment:   External Document

## 2010-05-21 NOTE — Progress Notes (Signed)
Summary: Refill/gh  Phone Note Refill Request Message from:  Melissa Villa on December 15, 2009 2:34 PM  Refills Requested: Medication #1:  HYDROCHLOROTHIAZIDE 25 MG TABS Take 1 tablet by mouth once a day   Last Refilled: 11/11/2009  Medication #2:  TRAMADOL HCL 50 MG TABS 1 - 2  tabs by mouth q 6-8 hrs as needed for severe pain   Last Refilled: 11/11/2009  Medication #3:  GLUCOPHAGE 850 MG TABS Take 1 tablet by mouth three times a day   Last Refilled: 11/11/2009  Medication #4:  SPIRIVA HANDIHALER 18 MCG CAPS Inhale contents of 1 capsule once a day   Last Refilled: 11/11/2009  Method Requested: Electronic Initial call taken by: Angelina Ok RN,  December 15, 2009 2:35 PM  Follow-up for Phone Call        Rx faxed to pharmacy. Follow-up by: Margarito Liner MD,  December 15, 2009 4:43 PM    Prescriptions: TRAMADOL HCL 50 MG TABS (TRAMADOL HCL) 1 - 2  tabs by mouth q 6-8 hrs as needed for severe pain  #240 x 0   Entered and Authorized by:   Margarito Liner MD   Signed by:   Margarito Liner MD on 12/15/2009   Method used:   Faxed to ...       [facility] (retail)       [address]       [place], [place]  [address]       Ph: [phone]       Fax: [phone]   RxID:   [id number] GLUCOPHAGE 850 MG TABS (METFORMIN HCL) Take 1 tablet by mouth three times a day  #90 x 6   Entered and Authorized by:   Margarito Liner MD   Signed by:   Margarito Liner MD on 12/15/2009   Method used:   Faxed to ...       [facility] (retail)       [address]       [place], [place]  [address]       Ph: [phone]       Fax: [phone]   RxID:   [id number] SPIRIVA HANDIHALER 18 MCG CAPS (TIOTROPIUM BROMIDE MONOHYDRATE) Inhale contents of 1 capsule once a day  #30 x 6   Entered and Authorized by:   Margarito Liner MD   Signed by:   Margarito Liner MD on 12/15/2009   Method used:   Faxed to ...       [facility] (retail)       [address]       [place], [place]  [address]       Ph: [phone]       Fax: [phone]   RxID:   [id number] HYDROCHLOROTHIAZIDE 25 MG TABS (HYDROCHLOROTHIAZIDE) Take 1 tablet by mouth once a day  #31 x 6   Entered and Authorized by:   Margarito Liner MD   Signed by:   Margarito Liner MD on 12/15/2009   Method used:   Faxed to ...       [facility] (retail)       [address]       [place], [place]  [address]       Ph: [phone]  Fax: [phone]   RxID:   [id number]

## 2010-05-21 NOTE — Cardiovascular Report (Signed)
Summary: [facility]  [facility]   Imported By: Marylou Mccoy 11/10/2009 18:34:19  _____________________________________________________________________  External Attachment:    Type:   Image     Comment:   External Document

## 2010-05-21 NOTE — Assessment & Plan Note (Signed)
Summary: FLU SHOT/CH  Nurse Visit   Allergies: No Known Drug Allergies  Immunizations Administered:  Influenza Vaccine # 1:    Vaccine Type: Fluvax MCR    Site: left deltoid    Mfr: Gl[employer]oSmith[employer]    Dose: 0.5 ml    Route: IM    Given by: Darlin Priestly- SN/Gladys Herbin, RN    Exp. Date: 10/17/2010    Lot #: [id number]    VIS given: 11/11/09 version given February 11, 2010.  Flu Vaccine Consent Questions:    Do you have a history of severe allergic reactions to this vaccine? no    Any prior history of allergic reactions to egg and/or gelatin? no    Do you have a sensitivity to the preservative Thimersol? no    Do you have a past history of Guillan-Barre Syndrome? no    Do you currently have an acute febrile illness? no    Have you ever had a severe reaction to latex? no    Vaccine information given and explained to patient? yes    Are you currently pregnant? no  Orders Added: 1)  Influenza Vaccine MCR [00025]

## 2010-05-21 NOTE — Miscellaneous (Signed)
Summary: [facility]  [facility]   Imported By: Marily Memos 08/29/2009 11:19:27  _____________________________________________________________________  External Attachment:    Type:   Image     Comment:   External Document

## 2010-06-10 ENCOUNTER — Ambulatory Visit (INDEPENDENT_AMBULATORY_CARE_PROVIDER_SITE_OTHER): Payer: Self-pay | Admitting: Internal Medicine

## 2010-06-10 ENCOUNTER — Encounter: Payer: Self-pay | Admitting: Internal Medicine

## 2010-06-10 DIAGNOSIS — R609 Edema, unspecified: Secondary | ICD-10-CM

## 2010-06-10 DIAGNOSIS — M25519 Pain in unspecified shoulder: Secondary | ICD-10-CM

## 2010-06-10 DIAGNOSIS — E7800 Pure hypercholesterolemia, unspecified: Secondary | ICD-10-CM

## 2010-06-10 DIAGNOSIS — M79609 Pain in unspecified limb: Secondary | ICD-10-CM

## 2010-06-10 DIAGNOSIS — M719 Bursopathy, unspecified: Secondary | ICD-10-CM

## 2010-06-10 DIAGNOSIS — M79606 Pain in leg, unspecified: Secondary | ICD-10-CM

## 2010-06-10 DIAGNOSIS — E119 Type 2 diabetes mellitus without complications: Secondary | ICD-10-CM

## 2010-06-10 DIAGNOSIS — I10 Essential (primary) hypertension: Secondary | ICD-10-CM

## 2010-06-10 DIAGNOSIS — J449 Chronic obstructive pulmonary disease, unspecified: Secondary | ICD-10-CM

## 2010-06-10 LAB — GLUCOSE, CAPILLARY: Glucose-Capillary: 87 mg/dL (ref 70–99)

## 2010-06-10 LAB — COMPREHENSIVE METABOLIC PANEL
ALT: 10 U/L (ref 0–35)
Albumin: 4.2 g/dL (ref 3.5–5.2)
Alkaline Phosphatase: 62 U/L (ref 39–117)
Glucose, Bld: 76 mg/dL (ref 70–99)
Potassium: 4.1 mEq/L (ref 3.5–5.3)
Sodium: 139 mEq/L (ref 135–145)
Total Protein: 8.2 g/dL (ref 6.0–8.3)

## 2010-06-10 LAB — LIPID PANEL
LDL Cholesterol: 193 mg/dL — ABNORMAL HIGH (ref 0–99)
Triglycerides: 115 mg/dL (ref <150)

## 2010-06-10 MED ORDER — HYDROCODONE-ACETAMINOPHEN 5-325 MG PO TABS: 1.0000 | ORAL_TABLET | Freq: Four times a day (QID) | ORAL | Status: DC | PRN

## 2010-06-10 MED ORDER — ROSUVASTATIN CALCIUM 5 MG PO TABS: 5.0000 mg | ORAL_TABLET | Freq: Every day | ORAL | Status: DC

## 2010-06-10 MED ORDER — FUROSEMIDE 20 MG PO TABS: 20.0000 mg | ORAL_TABLET | Freq: Every day | ORAL | Status: DC

## 2010-06-10 NOTE — Assessment & Plan Note (Addendum)
Lab Results  Component Value Date   HGBA1C 7.1 03/30/2010   CREATININE 0.90 03/30/2010   MICROALBUR 34.28* 06/18/2009   MICRALBCREAT 1158.1* 06/18/2009   CHOL 118 07/31/2009   LDL 74 07/31/2009   HDL 27* 07/31/2009   TRIG 86 07/31/2009    Assessment: Diabetes control: A1C slightly above target. Progress toward goals: unchanged Barriers to meeting goals: no barriers identified  Plan: Diabetes treatment: continue current medications Refer to: none Instruction/counseling given: reminded to get eye exam and reminded to bring medications to each visit

## 2010-06-10 NOTE — Patient Instructions (Signed)
Stop hydrochlorothiazide. Start furosemide 20 mg once a day. Stop tramadol. Start hydrocodone 5-acetaminophen 325 mg one tablets four times a day as needed for pain. Restart Crestor 5 mg one tablet daily.

## 2010-06-10 NOTE — Assessment & Plan Note (Signed)
Patient reports ongoing bilateral thigh pain which has not improved on Neurontin; the pain is not well controlled on tramadol.  The etiology of her pain is not clear.  Lumbar spine X-rays on 03/30/2010 showed degenerative changes in the lower lumbar spine with no acute bony findings.  Bilateral hip X-rays on 03/30/2010 showed mild symmetric loss of joint space in the hips.  Stopping Crestor did not help her pain.  Her pain is not sciatic, [name] think a lumbosacral spine process is less likely.  It is possible that her pain is neuropathic; The plan is to stop tramadol, and treat with hydrocodone-acetaminophen as needed; will continue Neurontin at current dose.

## 2010-06-10 NOTE — Progress Notes (Signed)
  Subjective:    Patient ID: Melissa Villa, female    DOB: July 01, 1945, 65 y.o.   MRN: [id number]  HPI  Patient presents for follow-up of her diabetes mellitus, COPD, hypercholesterolemia, chronic pain, and other chronic medical problems.  She reports ongoing pain in her thighs bilaterally, left greater than right , which interferes with sleep and is aggravated by walking.  She had no improvement in her pain after she stopped taking Crestor, and has currently been off of Crestor for about 2 months.  She reports that her pain is not well controlled on tramadol, and the addition of Neurontin in December 2011 has not significantly helped.  She reports that she is compliant with her medications. Her respiratory status is stable.  Review of Systems  Constitutional: Negative for fever and chills.  Respiratory: Negative for wheezing.   Gastrointestinal: Negative for nausea, vomiting and abdominal pain.  Musculoskeletal: Positive for back pain (Occasional low back pain.).       Objective:   Physical Exam  Constitutional: No distress.  Cardiovascular: Normal rate and regular rhythm.   Murmur heard.  Systolic murmur is present with a grade of 1/6       No lower extremity edema.  Pulmonary/Chest: Effort normal and breath sounds normal. No respiratory distress. She has no wheezes. She has no rales.  Abdominal: Bowel sounds are normal. She exhibits no distension. There is no tenderness.         Assessment & Plan:

## 2010-06-12 ENCOUNTER — Other Ambulatory Visit: Payer: Self-pay | Admitting: Internal Medicine

## 2010-06-12 NOTE — Assessment & Plan Note (Signed)
Assessment:  patient reports intermittent ankle edema , and feels that this is happening more often than before.   Plan:  Will stop HCTZ and start furosemide 20 mg daily.   Will check a metabolic panel.

## 2010-06-12 NOTE — Assessment & Plan Note (Signed)
Respiratory status is stable; continue current medications.

## 2010-06-12 NOTE — Assessment & Plan Note (Signed)
Patient has been off of Crestor for 2 months, with no improvement in her thigh pain. I think her pain is unrelated to the statin medication. The plan is to check a metabolic panel and lipid panel, and restart Crestor at previous dose.

## 2010-06-15 NOTE — Telephone Encounter (Signed)
Refill approved - nurse to complete. 

## 2010-06-16 ENCOUNTER — Encounter: Payer: Self-pay | Admitting: Family Medicine

## 2010-06-16 ENCOUNTER — Ambulatory Visit (INDEPENDENT_AMBULATORY_CARE_PROVIDER_SITE_OTHER): Payer: Medicare Other | Admitting: Family Medicine

## 2010-06-16 DIAGNOSIS — M7500 Adhesive capsulitis of unspecified shoulder: Secondary | ICD-10-CM

## 2010-06-17 NOTE — ED Provider Notes (Signed)
Order(s) created erroneously. Erroneous order ID: [id number] Order moved by: DOHERTY-CARBONE, Terese Heier Order move date/time: 06/17/2010 10:37 AM Source Patient:    [id number] Source Contact: 03/28/2010 Destination Patient:   [id number] Destination Contact: 06/15/2010

## 2010-06-17 NOTE — ED Provider Notes (Signed)
Order(s) created erroneously. Erroneous order ID: [id number] Order moved by: Welford Roche Order move date/time: 06/17/2010 10:36 AM Source Patient:    [id number] Source Contact: 03/28/2010 Destination Patient:   [id number] Destination Contact: 06/15/2010

## 2010-06-17 NOTE — ED Provider Notes (Signed)
Order(s) created erroneously. Erroneous order ID: [id number] Order moved by: DOHERTY-CARBONE, Garet Hooton Order move date/time: 06/17/2010 10:34 AM Source Patient:    [id number] Source Contact: 03/28/2010 Destination Patient:   [id number] Destination Contact: 06/15/2010

## 2010-06-17 NOTE — Telephone Encounter (Signed)
Rx called in 

## 2010-06-22 ENCOUNTER — Ambulatory Visit (HOSPITAL_COMMUNITY)
Admission: RE | Admit: 2010-06-22 | Discharge: 2010-06-22 | Disposition: A | Discharge status: Home / Self Care | Payer: Medicare Other | Source: Ambulatory Visit | Attending: Oncology | Admitting: Oncology

## 2010-06-22 ENCOUNTER — Other Ambulatory Visit: Payer: Self-pay | Admitting: Oncology

## 2010-06-22 DIAGNOSIS — R0602 Shortness of breath: Secondary | ICD-10-CM | POA: Insufficient documentation

## 2010-06-22 DIAGNOSIS — R0989 Other specified symptoms and signs involving the circulatory and respiratory systems: Secondary | ICD-10-CM | POA: Insufficient documentation

## 2010-06-22 DIAGNOSIS — C50919 Malignant neoplasm of unspecified site of unspecified female breast: Secondary | ICD-10-CM | POA: Insufficient documentation

## 2010-06-24 ENCOUNTER — Encounter (HOSPITAL_BASED_OUTPATIENT_CLINIC_OR_DEPARTMENT_OTHER): Payer: Medicare Other | Admitting: Oncology

## 2010-06-24 ENCOUNTER — Other Ambulatory Visit: Payer: Self-pay | Admitting: Oncology

## 2010-06-24 DIAGNOSIS — Z853 Personal history of malignant neoplasm of breast: Secondary | ICD-10-CM

## 2010-06-24 DIAGNOSIS — Z09 Encounter for follow-up examination after completed treatment for conditions other than malignant neoplasm: Secondary | ICD-10-CM

## 2010-06-24 DIAGNOSIS — Z85118 Personal history of other malignant neoplasm of bronchus and lung: Secondary | ICD-10-CM

## 2010-06-24 LAB — CBC WITH DIFFERENTIAL/PLATELET
Basophils Absolute: 0 10*3/uL (ref 0.0–0.1)
Eosinophils Absolute: 0.1 10*3/uL (ref 0.0–0.5)
HCT: 37 % (ref 34.8–46.6)
HGB: 11.5 g/dL — ABNORMAL LOW (ref 11.6–15.9)
MONO#: 0.6 10*3/uL (ref 0.1–0.9)
NEUT#: 6.1 10*3/uL (ref 1.5–6.5)
NEUT%: 71.2 % (ref 38.4–76.8)
WBC: 8.6 10*3/uL (ref 3.9–10.3)
lymph#: 1.8 10*3/uL (ref 0.9–3.3)

## 2010-06-24 LAB — BASIC METABOLIC PANEL
Potassium: 4.2 mEq/L (ref 3.5–5.3)
Sodium: 142 mEq/L (ref 135–145)
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
## 2010-06-30 LAB — GLUCOSE, CAPILLARY: Glucose-Capillary: 110 mg/dL — ABNORMAL HIGH (ref 70–99)

## 2010-07-03 LAB — DIFFERENTIAL
Eosinophils Absolute: 0.1 10*3/uL (ref 0.0–0.7)
Eosinophils Relative: 1 % (ref 0–5)
Lymphs Abs: 1.6 10*3/uL (ref 0.7–4.0)

## 2010-07-03 LAB — COMPREHENSIVE METABOLIC PANEL
ALT: 22 U/L (ref 0–35)
AST: 24 U/L (ref 0–37)
CO2: 24 mEq/L (ref 19–32)
Calcium: 9.2 mg/dL (ref 8.4–10.5)
Chloride: 107 mEq/L (ref 96–112)
GFR calc Af Amer: >60 mL/min (ref >60)
GFR calc non Af Amer: >60 mL/min (ref >60)
Potassium: 3.8 mEq/L (ref 3.5–5.1)
Sodium: 138 mEq/L (ref 135–145)

## 2010-07-03 LAB — CBC
Hemoglobin: 11.1 g/dL — ABNORMAL LOW (ref 12.0–15.0)
MCHC: 30.9 g/dL (ref 30.0–36.0)
RBC: 4.28 MIL/uL (ref 3.87–5.11)
WBC: 6.7 10*3/uL (ref 4.0–10.5)

## 2010-07-03 LAB — GLUCOSE, CAPILLARY: Glucose-Capillary: 163 mg/dL — ABNORMAL HIGH (ref 70–99)

## 2010-07-03 LAB — MICROALBUMIN / CREATININE URINE RATIO
Creatinine, Urine: 38.3 mg/dL
Microalb, Ur: 16.22 mg/dL — ABNORMAL HIGH (ref 0.00–1.89)

## 2010-07-03 LAB — BRAIN NATRIURETIC PEPTIDE: Pro B Natriuretic peptide (BNP): 74 pg/mL (ref 0.0–100.0)

## 2010-07-05 LAB — GLUCOSE, CAPILLARY: Glucose-Capillary: 146 mg/dL — ABNORMAL HIGH (ref 70–99)

## 2010-07-06 LAB — GLUCOSE, CAPILLARY: Glucose-Capillary: 130 mg/dL — ABNORMAL HIGH (ref 70–99)

## 2010-07-08 ENCOUNTER — Other Ambulatory Visit: Payer: Self-pay | Admitting: Internal Medicine

## 2010-07-08 MED ORDER — ROSUVASTATIN CALCIUM 5 MG PO TABS: 5.0000 mg | ORAL_TABLET | Freq: Every day | ORAL | Status: DC

## 2010-07-08 NOTE — Telephone Encounter (Signed)
I reordered because of incorrect quantity on previous prescription.

## 2010-07-16 ENCOUNTER — Other Ambulatory Visit: Payer: Self-pay | Admitting: Internal Medicine

## 2010-07-21 ENCOUNTER — Encounter: Payer: Self-pay | Admitting: Family Medicine

## 2010-07-21 ENCOUNTER — Ambulatory Visit (INDEPENDENT_AMBULATORY_CARE_PROVIDER_SITE_OTHER): Payer: Medicare Other | Admitting: Family Medicine

## 2010-07-21 DIAGNOSIS — M7500 Adhesive capsulitis of unspecified shoulder: Secondary | ICD-10-CM

## 2010-07-21 NOTE — Progress Notes (Signed)
65 year old with multiple medical problems, followup right-sided adhesive capsulitis. She is dramatically improved, improved about 3 or 4 days after her intra-articular shoulder injection. At this point her range of motion is essentially full, but she does have some occasional aching at night time.  REVIEW OF SYSTEMS  GEN: No fevers, chills. Nontoxic. Primarily MSK c/o today. MSK: Detailed in the HPI GI: tolerating PO intake without difficulty Neuro: No numbness, parasthesias, or tingling associated. Otherwise the pertinent positives of the ROS are noted above.    GEN: Well-developed,well-nourished,in no acute distress; alert,appropriate and cooperative throughout examination HEENT: Normocephalic and atraumatic without obvious abnormalities. Ears, externally no deformities PULM: Breathing comfortably in no respiratory distress EXT: No clubbing, cyanosis, or edema PSYCH: Normally interactive. Cooperative during the interview. Pleasant. Friendly and conversant. Not anxious or depressed appearing. Normal, full affect.  Shoulder: B Inspection: No muscle wasting or winging Ecchymosis/edema: neg  AC joint, scapula, clavicle: NT Cervical spine: NT, full ROM Spurling's: neg Abduction: full, 5/5 Flexion: full, 5/5 IR, full, lift-off: 5/5 ER at neutral: full, 5/5 AC crossover and compression: neg Neer: neg Hawkins: neg Drop Test: neg Empty Can: neg Scapular dyskinesis: none C5-T1 intact Sensation intact Grip 5/5  Assessment and plan: Adhesive capsulitis, improved, this point with discharge to p.r.n. followup only. Encouraged to maintain shoulder motion and to continue to work on fitness and overall weight loss.

## 2010-07-23 ENCOUNTER — Ambulatory Visit (INDEPENDENT_AMBULATORY_CARE_PROVIDER_SITE_OTHER): Payer: Medicare Other | Admitting: *Deleted

## 2010-07-23 DIAGNOSIS — Z950 Presence of cardiac pacemaker: Secondary | ICD-10-CM

## 2010-07-23 DIAGNOSIS — R0989 Other specified symptoms and signs involving the circulatory and respiratory systems: Secondary | ICD-10-CM

## 2010-07-23 DIAGNOSIS — I495 Sick sinus syndrome: Secondary | ICD-10-CM

## 2010-07-24 ENCOUNTER — Other Ambulatory Visit: Payer: Self-pay | Admitting: Internal Medicine

## 2010-07-25 LAB — GLUCOSE, CAPILLARY: Glucose-Capillary: 119 mg/dL — ABNORMAL HIGH (ref 70–99)

## 2010-07-28 LAB — GLUCOSE, CAPILLARY
Glucose-Capillary: 108 mg/dL — ABNORMAL HIGH (ref 70–99)
Glucose-Capillary: 91 mg/dL (ref 70–99)
Glucose-Capillary: 94 mg/dL (ref 70–99)

## 2010-07-28 LAB — BASIC METABOLIC PANEL
BUN: 19 mg/dL (ref 6–23)
Chloride: 103 mEq/L (ref 96–112)
GFR calc non Af Amer: >60 mL/min (ref >60)
Potassium: 3.9 mEq/L (ref 3.5–5.1)
Sodium: 136 mEq/L (ref 135–145)

## 2010-07-28 LAB — CBC
HCT: 34.7 % — ABNORMAL LOW (ref 36.0–46.0)
Hemoglobin: 11.3 g/dL — ABNORMAL LOW (ref 12.0–15.0)
MCV: 85.8 fL (ref 78.0–100.0)
Platelets: 214 10*3/uL (ref 150–400)
WBC: 5.9 10*3/uL (ref 4.0–10.5)

## 2010-07-28 LAB — APTT: aPTT: 36 seconds (ref 24–37)

## 2010-07-28 NOTE — Progress Notes (Signed)
Pacer remote check  

## 2010-07-29 ENCOUNTER — Ambulatory Visit (INDEPENDENT_AMBULATORY_CARE_PROVIDER_SITE_OTHER): Payer: Medicare Other | Admitting: Internal Medicine

## 2010-07-29 ENCOUNTER — Encounter: Payer: Self-pay | Admitting: Internal Medicine

## 2010-07-29 VITALS — BP 146/76 | HR 81 | Temp 98.2°F | Ht 64.0 in | Wt 280.0 lb

## 2010-07-29 DIAGNOSIS — I10 Essential (primary) hypertension: Secondary | ICD-10-CM

## 2010-07-29 DIAGNOSIS — G4733 Obstructive sleep apnea (adult) (pediatric): Secondary | ICD-10-CM

## 2010-07-29 DIAGNOSIS — D509 Iron deficiency anemia, unspecified: Secondary | ICD-10-CM

## 2010-07-29 DIAGNOSIS — J449 Chronic obstructive pulmonary disease, unspecified: Secondary | ICD-10-CM

## 2010-07-29 DIAGNOSIS — M79606 Pain in leg, unspecified: Secondary | ICD-10-CM

## 2010-07-29 DIAGNOSIS — E119 Type 2 diabetes mellitus without complications: Secondary | ICD-10-CM

## 2010-07-29 DIAGNOSIS — R0989 Other specified symptoms and signs involving the circulatory and respiratory systems: Secondary | ICD-10-CM

## 2010-07-29 DIAGNOSIS — M25519 Pain in unspecified shoulder: Secondary | ICD-10-CM

## 2010-07-29 DIAGNOSIS — M79609 Pain in unspecified limb: Secondary | ICD-10-CM

## 2010-07-29 LAB — GLUCOSE, CAPILLARY: Glucose-Capillary: 109 mg/dL — ABNORMAL HIGH (ref 70–99)

## 2010-07-29 LAB — COMPLETE METABOLIC PANEL WITH GFR
ALT: 18 U/L (ref 0–35)
AST: 21 U/L (ref 0–37)
Alkaline Phosphatase: 63 U/L (ref 39–117)
Creat: 0.67 mg/dL (ref 0.40–1.20)
GFR, Est African American: >60 mL/min (ref >60)
Sodium: 139 mEq/L (ref 135–145)
Total Bilirubin: 0.3 mg/dL (ref 0.3–1.2)
Total Protein: 8.2 g/dL (ref 6.0–8.3)

## 2010-07-29 LAB — FERRITIN: Ferritin: 53 ng/mL (ref 10–291)

## 2010-07-29 LAB — POCT GLYCOSYLATED HEMOGLOBIN (HGB A1C): Hemoglobin A1C: 7.3

## 2010-07-29 MED ORDER — GABAPENTIN 100 MG PO CAPS: 100.0000 mg | ORAL_CAPSULE | Freq: Three times a day (TID) | ORAL | Status: DC

## 2010-07-29 MED ORDER — HYDROCODONE-ACETAMINOPHEN 5-325 MG PO TABS: 1.0000 | ORAL_TABLET | Freq: Four times a day (QID) | ORAL | Status: DC | PRN

## 2010-07-29 NOTE — Assessment & Plan Note (Signed)
Hemoglobin A1C  Date Value Range Status  07/29/2010 7.3   Final  03/30/2010 7.1   Final    Assessment: Diabetes control: mildly uncontrolled Progress toward goals: deteriorated Barriers to meeting goals: no barriers identified  Plan: Diabetes treatment: continue current medications Refer to: diabetes educator for self-management training Instruction/counseling given: reminded to bring blood glucose meter & log to each visit and reminded to bring medications to each visit

## 2010-07-29 NOTE — Assessment & Plan Note (Signed)
  BP Readings from Last 3 Encounters:  07/29/10 146/76  07/21/10 152/72  06/16/10 160/77    Assessment: Hypertension control:  mildly elevated  Progress toward goals:  improved Barriers to meeting goals:  no barriers identified  Plan: Hypertension treatment:  continue current medications

## 2010-07-29 NOTE — Assessment & Plan Note (Signed)
Pain is reasonable well controlled on hydrocodone/acetaminophen; plan is to continue current regimen.

## 2010-07-29 NOTE — Assessment & Plan Note (Signed)
Continue hydrocodone/acetaminophen; follow up with Sports Medicine.

## 2010-07-29 NOTE — Assessment & Plan Note (Signed)
Patient reports increased exertional dyspnea.  Plan is to obtain full PFTs; continue current inhaler regimen.

## 2010-07-29 NOTE — Assessment & Plan Note (Signed)
Patient is doing well on CPAP; plan is to continue CPAP.

## 2010-07-29 NOTE — Progress Notes (Signed)
  Subjective:    Patient ID: Melissa Villa, female    DOB: [birth year], 65 y.o.   MRN: [id number]  HPI Patient returns for followup of her diabetes mellitus, hypertension, hyperlipidemia, obstructive sleep apnea, and other chronic medical problems.  Today her main complaint is somewhat worsened shortness of breath, especially with exertion, despite regular use of her inhaled bronchodilators as prescribed.  She has occasional wheezing with the shortness of breath.  She reports some ongoing trouble sleeping, and says that she awakens early; she has been on more than one medication in the past for insomnia which have not improved her sleep and have left her brought here during the daytime.  She reports that she is compliant with her medications, and with the use of her CPAP machine.  Review of Systems  Respiratory: Positive for shortness of breath (With exertion.) and wheezing (With exertion.). Negative for cough.   Cardiovascular: Negative for chest pain and leg swelling.  Gastrointestinal: Positive for nausea (Occasional nausea, none at present.). Negative for vomiting, abdominal pain, diarrhea and blood in stool.  Genitourinary: Negative for dysuria, urgency, frequency and difficulty urinating.  Musculoskeletal: Positive for arthralgias (Legs.).       Objective:   Physical Exam  Constitutional: No distress.  Cardiovascular: Normal rate and regular rhythm.   Murmur heard.  Systolic murmur is present with a grade of 2/6  Pulmonary/Chest: Effort normal and breath sounds normal. No respiratory distress. She has no wheezes. She has no rales.  Abdominal: Soft. Bowel sounds are normal. She exhibits no distension. There is no tenderness. There is no rebound and no guarding.  Musculoskeletal: She exhibits no edema.    Assessment & Plan:

## 2010-07-29 NOTE — Assessment & Plan Note (Addendum)
Patient reports some worsening of her chronic exertional dyspnea, with some associated wheezing.  Her lung exam today is clear.  A recent chest x-ray done in March showed pulmonary vascular congestion only.  The plan is to continue current inhaled bronchodilators, and obtain pulmonary function tests to evaluate.  I advised her to call or return if her symptoms worsen.

## 2010-07-29 NOTE — Patient Instructions (Signed)
Please schedule an appointment for follow-up with diabetes educator Jamison Neighbor.

## 2010-08-04 LAB — GLUCOSE, CAPILLARY: Glucose-Capillary: 118 mg/dL — ABNORMAL HIGH (ref 70–99)

## 2010-08-05 ENCOUNTER — Ambulatory Visit (HOSPITAL_COMMUNITY)
Admission: RE | Admit: 2010-08-05 | Discharge: 2010-08-05 | Disposition: A | Discharge status: Home / Self Care | Payer: Medicare Other | Source: Ambulatory Visit | Attending: Internal Medicine | Admitting: Internal Medicine

## 2010-08-05 DIAGNOSIS — J449 Chronic obstructive pulmonary disease, unspecified: Secondary | ICD-10-CM

## 2010-08-05 DIAGNOSIS — J988 Other specified respiratory disorders: Secondary | ICD-10-CM | POA: Insufficient documentation

## 2010-08-10 ENCOUNTER — Other Ambulatory Visit: Payer: Self-pay | Admitting: *Deleted

## 2010-08-10 ENCOUNTER — Encounter: Payer: Self-pay | Admitting: Internal Medicine

## 2010-08-10 DIAGNOSIS — D509 Iron deficiency anemia, unspecified: Secondary | ICD-10-CM

## 2010-08-10 MED ORDER — ALBUTEROL SULFATE HFA 108 (90 BASE) MCG/ACT IN AERS: 2.0000 | INHALATION_SPRAY | Freq: Four times a day (QID) | RESPIRATORY_TRACT | Status: DC | PRN

## 2010-08-10 MED ORDER — FERROUS SULFATE 325 (65 FE) MG PO TABS: 325.0000 mg | ORAL_TABLET | Freq: Three times a day (TID) | ORAL | Status: DC

## 2010-08-10 NOTE — Assessment & Plan Note (Signed)
Ferritin  Date Value Range Status  07/29/2010 53  10-291 (ng/mL) Final   Ferritin level was 53 on 07/29/2010.  Plan is to restart ferrous sulfate 325 mg orally 3 times a day, and recheck a ferritin in about 3 months.

## 2010-08-11 ENCOUNTER — Other Ambulatory Visit: Payer: Self-pay | Admitting: *Deleted

## 2010-08-11 MED ORDER — TIOTROPIUM BROMIDE MONOHYDRATE 18 MCG IN CAPS: 18.0000 ug | ORAL_CAPSULE | Freq: Every day | RESPIRATORY_TRACT | Status: DC

## 2010-08-11 MED ORDER — AMLODIPINE BESYLATE 10 MG PO TABS: 10.0000 mg | ORAL_TABLET | Freq: Every day | ORAL | Status: DC

## 2010-08-11 NOTE — Progress Notes (Signed)
Pt informed and voices understanding 

## 2010-08-12 ENCOUNTER — Ambulatory Visit: Payer: Medicare Other

## 2010-08-12 ENCOUNTER — Encounter: Payer: Self-pay | Admitting: Dietician

## 2010-08-12 ENCOUNTER — Ambulatory Visit (INDEPENDENT_AMBULATORY_CARE_PROVIDER_SITE_OTHER): Payer: Medicare Other | Admitting: Dietician

## 2010-08-12 DIAGNOSIS — E119 Type 2 diabetes mellitus without complications: Secondary | ICD-10-CM

## 2010-08-12 NOTE — Progress Notes (Signed)
Medical Nutrition Therapy:  Appt start time: 1030 end time:  1115.  Assessment:  Primary concerns today: Weight management and Blood sugar control Usual eating pattern includes Meal 2 and 1+ snacks per day.   Wakes with nausea, drinks diet pepsi with meds to settle stomach. Lactose intolerant so does not drink milk, gets little sun or other sources of vitamin D. Sometimes skips meals, Eats eggs, grits, bologna sandwich, sausage sandwich, dried beans and fruit a few times a month. Has been eating large amunts bananas for potassium and cramping which has helped. Reports the only money she has for food is her food stamps and that is ~ 25.00 per month.  Takes laxative despite Bowel movement every other day because she does not feel that she completely eliminates. Poor sleep despite CPAP. Falls asleep without difficulty, but does not stay asleep. Poor energy during day, but does not nap. Avoided foods include: sweetened cereal and regular soda.     Usual physical activity includes ADLs only Labs: A1C 7.3% recently, HDL 07/2009 low at 27 Accucheck meter downloaded: pattern is in target fasting (average is 113) with gradual slow rise during day( Pm average is 150-180.  Meds for diabetes include: lantus 38-40 units depending on her blood sugar level,  januvia 50 mg and metformin 850 mg 3x daily, she reports taking them all as directed  Progress Towards Goal(s):  In progress   Nutritional Diagnosis:  NB-1.1 Food and nutrition-related knowledge deficit As related to poor bowel habits and rising weight, blood sugars.  As evidenced by patient unable to identify foods high in saturated fats, calories, fiber and carbs.  Interventions: 1- Education about good sleep hygiene 2- Education about high fiber foods and generally healthy food choices 3- Counseling about goal setting and behavior change and poor sleep affect on same 4- Education about foods high in calories, carbs and saturated fat 5- Education about  source of vitamin D 6- education about action and dosages of diabetes medications 7- Coordination of care:   A. Request testing for vitamin D  B. Suggest increase in [place] and slight decrease in lantus to promote improved    glycemic control throughout day, weight loss and decrease risk of hypoglycemia  C. Address poor sleep and energy level prior to setting goals specific for weight loss/dietary   Change  D. Social work Dance movement psychotherapist for assistance with budgeting    Monitoring/Evaluation:  Dietary intake and sleep hygiene in 3 week(s)

## 2010-08-12 NOTE — Patient Instructions (Signed)
Follow SLEEP Tips if able to help increase possibility of improved rest at night.  Increase fiber in your diet by eating 2-3 cups of vegetables each and every day, 2-4 small servings of fruit (eat this instead of starches like bread, grits, potatoes, pasta and crackers), use only brown rice, whole grain pasta, bread and cereal.  Do not increase your lantus any more than 38 units a day. Please call me if any morning blood sugars are ever less than [phone]. [phone]

## 2010-08-16 ENCOUNTER — Encounter: Payer: Self-pay | Admitting: *Deleted

## 2010-09-01 NOTE — Discharge Summary (Signed)
Melissa Villa, Melissa Villa               ACCOUNT NO.:  [id number]   MEDICAL RECORD NO.:  [id number]          PATIENT TYPE:  INP   LOCATION:  2005                         FACILITY:  [facility]   PHYSICIAN:  [name], MD   DATE OF BIRTH:  1946/02/07   DATE OF ADMISSION:  10/20/2006  DATE OF DISCHARGE:  10/24/2006                               DISCHARGE SUMMARY   DISCHARGE DIAGNOSES:  1. Dyspnea.  2. Cough.  3. Headache.  4. Chronic obstructive pulmonary disease.  5. Congestive heart failure.  6. Obstructive sleep apnea, on CPAP.  7. Hypertension.  8. A history of atrioventricular block with a pacemaker implanted in      2003.  9. 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   DISCHARGE MEDICATIONS:  Include:  1. Nasonex 2 sprays daily.  2. Zyrtec 10 mg daily.  3. Glucophage 850 mg 1 tablet 3 times daily.  4. Lipitor 80 mg, take 1 tablet daily.  5. Insulin 70/30 at 50 units subcutaneous in the morning and 25 units      in the p.m.  6. Aggrenox 1 tablet twice daily.  7. Prevacid 30 mg daily.  8. Lortab 10/500 mg 1 tablet every 6 hours as needed for pain.  9. Ambien CR 6.75 mg 1 tablet at bedtime as needed for sleep.  10.Iron 325 mg 1 tablet 3 times a day.  11.Actos 30 mg 1 tablet daily.  12.Avalide 300/25 mg, take 1 tablet daily.  13.Atenolol 150 mg, take 1 tablet daily.  14.Advair 250/50 one puff twice daily.  15.Albuterol MDI two puffs 4 times daily as needed.  16.Spiriva 1 puff twice daily.  17.Multivitamin 1 tablet daily.   DISPOSITION AND FOLLOWUP:  Patient is to follow up on July 24 with Dr.  Landis Martins at 2 p.m. [facility].  Patient needs to have  followup with Cardiology, get pacemaker checked, and, most importantly,  have her CPAP evaluated as the parameters may need to be adjusted.   PROCEDURES:  Patient had a 2-D echo on July 5 which showed overall left  ventricular systolic function was normal.  The study was inadequate for  the  evaluation of left ventricular regional wall motion.  Patient had a  chest x-ray July 3 which showed worsening of the chest radiography with  new density in the lower lobes, right more than left.  I think there is  likely pneumonia, at least in the right lower lobe.  There may also be  superimposed edema, fluid overload.  The patient had another chest x-ray  on July 7 which showed some decrease in vascular congestion, resolved  pulmonary edema, mild right base atelectasis.   H&P:  This is a 65 year old female with a past medical history  significant for CHF requiring hospitalization in 2003 though EF was 55  to 60%.  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  The patient now  presents with  approximately 2 weeks of increasing fatigue and shortness  of breath.  At baseline, patient is able to slowly ambulate but is able  to take care of herself.  Now she has increased dyspnea on exertion and  a 6 to 7-pillow orthopnea.  The patient is not sleeping well and  complains of an increasing headache for the last 2 weeks.  The patient  states she frequently has headaches, but recently it does not go away as  the day goes on.  She also has a chronic cough but notices an increase  in nonproductive cough recently.  The patient has a chronic pain on a  thoracotomy incision but denies any chest pain.  The patient does  acknowledge having some chills for a few days at night.   PHYSICAL EXAMINATION:  VITAL SIGNS on admission:  Temperature is _____,  blood pressure is 131/80, respirations 18, O2 saturation 95% on room  air.  GENERAL:  The patient is obese, in no acute distress at rest.  LUNGS:  Bilateral crackles at bases all the way up to the inferior  border of the scapula on the left.  There are no wheezes nor rhonchi.  HEART:  There is regular rate and  rhythm.  No murmur, gallops, or rubs.  ABDOMEN:  Obese, positive bowel sounds, soft, nontender, no masses.  EXTREMITIES:  No cyanosis, +1 pitting edema of bilateral lower  extremities up to the mid leg.   LABS ON ADMISSION:  Include a sodium of 134, potassium of 3.1, chloride  of 101, bicarb of 24, BUN of 17, creatinine 0.94, glucose 157, WBC 11.6,  hemoglobin 12.6, platelets of 28, absolute neutrophil count of 9.5.  EKG  shows no evidence of ischemia, and cardiac enzymes are negative x2.   HOSPITAL COURSE:  1. Dyspnea.  Upon admission, patient was worked up for CHF, MI,      pneumonia.  EKG and cardiac enzymes were negative for ischemia.      Pneumonia was unlikely since the patient had a normal white blood      cell count and did not have any fever.  The patient was treated for      a CHF exacerbation with diuresis and began improving.  The      patient's respiratory status continued to improve throughout      without any fever nor increased white blood cell count, so Avelox      was discontinued.  2. Cough.  The patient's cough resolved during the hospital course,      likely secondary to the pulmonary edema.  3. Headache.  Patient claims to have some improvement in the headache      throughout the hospital course, probably related to the hypercapnia      and association with the pulmonary edema and obstructive sleep      apnea.  4. COPD.  Patient's home medications were continued throughout the      hospital course.  5. CHF.  Again, the patient was diuresed and respiratory status      improved.   DISCHARGE LABS AND VITALS:  Discharge temperature 98.5, pulse 101,  respirations 20, blood pressure 148/75, O2 saturation 100% on room air.  Labs at discharge included a sodium of 140, potassium 4.1, chloride 105,  bicarb of 29, glucose of 104, BUN of 12, creatinine 0.67, calcium of  9.1, WBC of 8.3, hemoglobin of 11.4, and platelets of 250.      [name], MD   Electronically Signed  MA/MEDQ  D:  10/28/2006  T:  10/29/2006  Job:  [id number]   cc:   [facility]patient Clinic  Chauncey Reading, D.O.

## 2010-09-01 NOTE — Group Therapy Note (Signed)
Melissa Villa, KILDUFF NO.:  [id number]   MEDICAL RECORD NO.:  [id number]          PATIENT TYPE:  WOC   LOCATION:  [facility]                   FACILITY:  [facility]   PHYSICIAN:  Ginger Carne, MD DATE OF BIRTH:  [birth year]   DATE OF SERVICE:                                  CLINIC NOTE   REASON FOR CONSULTATION:  Yearly exam.   This patient is a 65 year old morbidly obese African American female who  presents today for yearly gynecologic examination.  She has had history  of bilateral breast carcinoma diagnosed in 1992 and 1993, followed by  lung cancer in 2003 with a partial right lobectomy.  The patient does  not use hormones.  She had a mammogram in March of this year on her left  breast and has a followup on her other breast in August 2009.  The  patient denies any gynecological symptomatology.  She has specifically  no GU, GI or cardiac complaints.   OB/GYN HISTORY:  The patient has had three full-term vaginal deliveries.  She went through menopause approximately 10 years ago.   MEDICAL HISTORY:  The patient has asthma, hypertension type 1 diabetes  mellitus and bronchitis.   SURGICAL HISTORY:  1. Bilateral breast carcinoma diagnosed in 85 and 1993.  She has      bilateral lumpectomies in both 1992, 1993.  2. Lung carcinoma in 2003 with a partial right lobectomy of the lung      2003.   ALLERGIES:  None.   CURRENT MEDICATIONS:  1. Glucophage 850 mg one three times a day.  2. Lipitor 80 mg daily.  3. Humulin 70/30 suspension injected 50 units subcutaneous each a.m.      and 25 units each p.m.  4. Nexium 40 mg once a day.  5. Nasonex 50 mcg two sprays in each nostril once a day.  6. Zyrtec 10 mg per day.  7. Aggrenox 25/200 mg one twice a day.  8. Lortab as needed.  9. Actos 30 mg daily.  10.Avalide 300/25 one daily.  11.Atenolol 50 mg one three times a day.  12.Advair discus 1 puff two times a day.  13.Albuterol 90 mcg 2 puffs four times a  day.  14.Spiriva HandiHaler, inhale 1 capsule a day.  15.Norvasc 10 mg once a day.  16.Multivitamins.  17.Ambien CR 6.25 mg daily.   SOCIAL HISTORY:  Patient is a nonsmoker.  Denies alcohol or illicit drug  abuse.   FAMILY HISTORY:  Mother has coronary artery heart disease, type 2  diabetes and hypertension.   REVIEW OF SYSTEMS:  A 14 point comprehensive review of systems within  normal limits.   PHYSICAL EXAM:  Blood pressure 125/64, weight 301 pounds, height 5 foot  4-1/2 inches, pulse 82 and regular.  HEENT:  Grossly normal.  BREAST:  Without masses, discharge, thickenings or tenderness.  CHEST:  Clear to percussion and auscultation.  CARDIOVASCULAR:  Without murmurs or enlargements, regular rate and  rhythm.  Extremity, lymphatic, skin, neurological, musculoskeletal systems within  normal limits.  ABDOMEN:  Reveals gross central obesity.  PELVIC:  External genitalia:  Vulva and  vagina normal.  Cervix smooth  without erosions or lesions.  Pap smear deferred.  Bimanual exam is  difficult to ascertain masses but none are specifically noted in the  adnexa or central portion of the pelvis.   IMPRESSION:  Normal gynecologic exam limited by central obesity.   PLAN:  The patient was scheduled for a bone density scan as she is not  utilizing hormone replacement therapy.  The patient was also advised  that annual Pap smears are not necessary as she has not had an abnormal  Pap smear in the past.  She was recommended to have every 3-year Pap  smears and around the age of 90-70 she can defer these tests  indefinitely.  The patient will follow up with her surgeon and the  [facility] for her breast and lung carcinoma.           ______________________________  Ginger Carne, MD     SHB/MEDQ  D:  09/28/2007  T:  09/28/2007  Job:  [id number]

## 2010-09-01 NOTE — Discharge Summary (Signed)
Melissa Villa, Melissa Villa               ACCOUNT NO.:  [id number]   MEDICAL RECORD NO.:  [id number]          PATIENT TYPE:  INP   LOCATION:  2005                         FACILITY:  [facility]   PHYSICIAN:  [name], MD   DATE OF BIRTH:  08/27/1945   DATE OF ADMISSION:  10/20/2006  DATE OF DISCHARGE:  10/24/2006                               DISCHARGE SUMMARY   Audio too short to transcribe (less than 5 seconds)      [name], MD     MA/MEDQ  D:  10/28/2006  T:  10/28/2006  Job:  [id number]

## 2010-09-01 NOTE — Discharge Summary (Signed)
Melissa Villa, Melissa Villa               ACCOUNT NO.:  [id number]   MEDICAL RECORD NO.:  [id number]          PATIENT TYPE:  INP   LOCATION:  2005                         FACILITY:  [facility]   PHYSICIAN:  [name], MD   DATE OF BIRTH:  05-18-1945   DATE OF ADMISSION:  10/20/2006  DATE OF DISCHARGE:  10/24/2006                               DISCHARGE SUMMARY   DISCHARGE DIAGNOSES:  1. Dyspnea.  2. Cough.  3. Headache.  4. Chronic obstructive pulmonary disease.  5. Congestive heart failure.  6. Obstructive sleep apnea on CPAP every night.  7. Hypertension.  8. Complete atrioventricular block with pacemaker implanted in 2003.  9. History of carcinoid tumor in the right lower lobe and this was      resected.   DISCHARGE MEDICATIONS:  Include:  1. Nasonex 2 sprays daily.  2. Zyrtec 10 mg p.o. daily.  3. Glucophage 850 mg 1 tablet 3 times daily.  4. Lipitor 80 mg 1 tablet daily.  5. Insulin 70/30, 50 units subcutaneous in the morning and 25 units      subcutaneous in the evening.  6. Aggrenox 1 capsule twice daily.  7. Prevacid 30 mg 1 daily.  8. Lortab 10/500 mg take 1 tablet every 6 hours as needed for pain.  9. Ambien CR 6.75 mg 1 tablet daily as needed for sleep.  10.Iron 325 mg 1 tablet 3 times a day.  11.Actos 30 mg take 1 tablet daily.  12.Avolide 300/25 mg take 1 tablet daily.  13.Atenolol 150 mg take 1 tablet daily.  14.Advair 250/50 mg 1 puff twice daily.  15.Albuterol MDI 2 puffs 4 times a day as needed.  16.Spiriva 1 puff twice daily.  17.Multiple vitamin take 1 tablet daily.   DISPOSITION AND FOLLOWUP:  The patient is to followup in November 10, 2006  with Dr. Landis Martins at the outpatient clinic at 2 o'clock p.m.  He needs to  have followup with cardiology to have the pacemaker checked and most  importantly, she needs her CPAP settings to be evaluated.   PROCEDURES:  1. The patient had a 2D echo on October 22, 2006.  The echo showed overall      left ventricular systolic  function was normal.  The study was      inadequate for the evaluation of left regional wall motion.  2. Chest x-ray on July 3rd.  Impression was worsening of the chest      radiograph with new density in the lower lobes, right more than      left.  I think there is likely pneumonia, at least in the right      lower lobe.  There may also be superimposed edema, fluid overload.  3. Chest x-ray done October 24, 2006 shows some decrease in vascular      congestion.  Result, pulmonary edema.  Mild right base atelectasis.   HISTORY AND PHYSICAL:  This is a 65 year old female with past medical  history significant for CHF requiring hospitalization in 2003.  History  of complete A-V block with pacemaker implanted in 2003, as well as COPD  and obstructive sleep apnea on CPAP, and status post right lower lobe  resection for carcinoid tumor, now presents with approximately 2 weeks  of increasing fatigue and shortness of breath.  The patient is able to  take care of herself, now with increased dyspnea on exertion and 6-7  pillow orthopnea.  The patient claims to not be sleeping well and  complaining of headache in the last 2 weeks, but recently the headache  does not go away during the day as it usually did before.  She also has  a chronic cough, but notes increased nonproductive cough recently, has  chronic pain at thoracotomy incision but denies any chest pain, has had  some palpitations.  The patient denies any nausea, vomiting, diarrhea or  constipation.   PHYSICAL EXAMINATION:  VITAL SIGNS:  Temperature 99.0 degrees, pulse of  81, blood pressure 131/80, respirations 18, O2 saturation 95% on room  air.  GENERAL:  The patient is an obese female, in no acute distress at rest.  RESPIRATORY:  Positive crackles bilateral bases up to the inferior  border of the scapula on the left.  There is no wheezes or rhonchi.  CARDIOVASCULAR:  There is regular rate and rhythm, no murmurs, rubs or  gallops.   ABDOMEN:  The patient is obese.  Positive bowel sounds.  Soft,  nontender, no masses.  EXTREMITIES:  There is no cyanosis.  There is +1 edema bilateral lower  extremities up to the mid leg.   LABORATORY ON ADMISSION:  Sodium is 134, potassium 3.1, chloride 101,  bicarb 24, BUN 17, creatinine 0.94, glucose 157.  WBC 11.6, hemoglobin  12.6, platelets 228 with an absolute neutrophil count of 9.1.   EKG shows no evidence of ischemia.  Cardiac enzymes negative x2.   HOSPITAL COURSE:  1. Dyspnea on admission:  The patient was worked up for CHF, MI,      pneumonia.  EKG and cardiac enzymes were negative for ischemia.      Pneumonia was unlikely since the patient had no increase in white      blood cell count or fever.  Chest x-ray was consistent with some      pulmonary congestion.  The patient was treated for CHF exacerbation      and began improving.  The patient had been placed on Avelox but was      discontinued since there was improving respiratory status with      diuresis and there was no fever or increase in her WBC's.  2. Cough:  This resolved with improving respiratory status, likely      secondary to the pulmonary edema.  3. Headache:  The patient had some improvement about the hospital      course along with improving respiratory status likely from moments      of hypoxia during night with her obstructive sleep apnea.      Questionable that the headaches were related to sinuses since the      patient had been taking Zyrtec at home and was taking Claritin in      the hospital instead.  4. COPD:  Home medications were continued in the hospital.  5. CHF:  Again, the patient was diuresed and showed improvement in      respiratory status.   LABORATORIES AND VITALS AT DISCHARGE:  Upon discharge the vitals  included temperature of 98.5, pulse of 101.0, respirations of 20 and  blood pressure of 148/75 and 100% O2 saturation on room air.  Labs included a sodium of 140, potassium of  4.1, chloride of 105, bicarb  of 29, glucose of 104, BUN of 12, creatinine of 0.67, calcium of 9.1.  WBC of 8.3, hemoglobin of 11.4 and platelet count of 250.      [name], MD  Electronically Signed     MA/MEDQ  D:  10/28/2006  T:  10/29/2006  Job:  [id number]   cc:   Chauncey Reading, D.O.

## 2010-09-01 NOTE — Cardiovascular Report (Signed)
NAMECANDIDA, Melissa Villa               ACCOUNT NO.:  [id number]   MEDICAL RECORD NO.:  [id number]          PATIENT TYPE:  OIB   LOCATION:  2899                         FACILITY:  [facility]   PHYSICIAN:  Antionette Char, MD    DATE OF BIRTH:  09/06/1945   DATE OF PROCEDURE:  08/21/2008  DATE OF DISCHARGE:  08/21/2008                            CARDIAC CATHETERIZATION   OPERATOR:  John R. Tysinger, MD   PROCEDURE:  Permanent pacemaker pulse generator replacement.   INDICATIONS FOR PROCEDURE:  End-of-life characteristics on the existing  pulse generator battery.   PROCEDURE:  After signing an informed consent, the patient was  premedicated with 5 mg of Valium by mouth and brought to the Cardiac  Catheterization Lab at [facility].  Her right anterior chest  and base of neck were prepped and draped in a sterile fashion, and a  right transverse subclavicular plane was anesthetized locally overlying  the existing pulse generator.  An incision was made in this anesthetized  plane with the incision being deepened into the fibrous layer overlying  the pulse generator.  This fibrous layer was then incised exposing the  pulse generator, which was removed from the pocket without difficulty.  The pulse generator was then removed from the pacing electrodes in the  usual fashion.  Both electrodes were then analyzed.  Finding excellent  chronic thresholds in the atrial lead.  The minimum voltage threshold  was 0.6 volts utilizing 1.2 milliamps of current.  The P-wave  sensitivity measured 3.5 millivolts and the resistance was measured at  456 ohms.  In the ventricular lead, the minimum voltage threshold was  1.3 volts utilizing 2.4 milliamps of current.  The R-wave sensitivity  measured greater than 12.5 millivolts and the resistance was measured at  461 ohms.  After obtaining these chronic thresholds, the wound was  lavaged profusely with a kanamycin solution.  We then selected a new  pulse  generator made by [facility] model [employer], model number  [id number], serial number [id number].  After properly analyzing the new pulse  generator, it was attached to the pacing electrodes in the usual  fashion.  The pulse generator was then replaced in the existing pocket,  and the wound was closed in layers using 2-0 Vicryl.  Final skin closure  was obtained with a cutaneous layer of Steri-Strips.  The patient  tolerated the procedure well and no complications were noted.  At the  end of the procedure, a sterile bulky dressing was applied to the wound  and she was returned to the Short-Stay Unit for further monitoring and  one further dose of Ancef prior to discharge home.  Wound care  instructions were given.  She was also instructed to have a followup  visit in the office in 1 week to check on her wound and the new  pacemaker function.      Antionette Char, MD  Electronically Signed     JRT/MEDQ  D:  08/21/2008  T:  08/21/2008  Job:  [id number]   cc:   Cath Lab at [facility]

## 2010-09-01 NOTE — Discharge Summary (Signed)
NAMELANNA, LABELLA               ACCOUNT NO.:  [id number]   MEDICAL RECORD NO.:  [id number]          PATIENT TYPE:  INP   LOCATION:  2005                         FACILITY:  [facility]   PHYSICIAN:  [name], MD   DATE OF BIRTH:  May 10, 1945   DATE OF ADMISSION:  10/20/2006  DATE OF DISCHARGE:  10/24/2006                               DISCHARGE SUMMARY   Audio too short to transcribe (less than 5 seconds)      [name], MD     MA/MEDQ  D:  10/28/2006  T:  10/28/2006  Job:  [id number]

## 2010-09-02 ENCOUNTER — Ambulatory Visit (INDEPENDENT_AMBULATORY_CARE_PROVIDER_SITE_OTHER): Payer: Medicare Other | Admitting: Dietician

## 2010-09-02 DIAGNOSIS — E119 Type 2 diabetes mellitus without complications: Secondary | ICD-10-CM

## 2010-09-02 MED ORDER — INSULIN GLARGINE 100 UNIT/ML ~~LOC~~ SOLN: 35.0000 [IU] | Freq: Every day | SUBCUTANEOUS | Status: DC

## 2010-09-02 MED ORDER — SITAGLIPTIN PHOSPHATE 100 MG PO TABS: 100.0000 mg | ORAL_TABLET | Freq: Every day | ORAL | Status: DC

## 2010-09-02 NOTE — Patient Instructions (Addendum)
Your goal you set today is to eat at least 2 cups ( half a plate) of vegetables each day.  Make a follow up in 4 weeks.   You should hear form Mamie about mask for CPAP  Decrease your lantus dose to 35 units tonight  Increase your Januvia to 100 mg today- can take 2 50 mg pills until you pick up new prescription.

## 2010-09-02 NOTE — Progress Notes (Signed)
Medical Nutrition Therapy:  Appt start time: 1000 end time:  1100.  Assessment:  Primary concerns today: Weight management, Blood sugar control and indigestion and low energy. 24 hours recall: bowl of raisin bran clusters, apple orange, 1 cup spaghetti, 1 slice garlic bread. Beverages on ly diet soda and water. Gassy and burping daily. Sleep still poor, bought low sodium v-8 and silk milk. Takes calcium with vit D two tabs a day and a multivitamin. Usual eating pattern includes Meal 2 and 1+ snacks per day.      Usual physical activity includes very little energy to do much including ADLs. Gets out of breath easily and no energy  Diagnosis and Intervention:  Progress Towards Goal(s):  In progress   Nutritional Diagnosis:  NB-1.1 Food and nutrition-related knowledge deficit As related to improving: worked on increasing vegetable intake today..  As evidenced by patient report fo eating 2 cups vegetables per day  ~ 25% of the time.     Interventions: 1- Counseling in how to incorporate more vegetables into diet. 2- Goal setting: specific, measurable, attainable, realistic and timed goal set to increase % of days that she eats 2 cups vegetables per day. 3- Coordination of care- spoke with Dr. Meredith Pel who approved increase in [place] and decrease in lantus to encourage weight loss and maintain adequate blood sugar control. 4- Patient went to lab for vitamin D testing 5- Social support provided 6- review of meter download: lowest 86 and highest value 296 with pattern of increasing blood sugars over course of day from fasting average of 145 to bedtime average of 200mg /dl  Monitoring/Evaluation:  Dietary intake in 4 week(s)

## 2010-09-02 NOTE — Progress Notes (Signed)
I discussed patient with Jamison Neighbor and agree with plan.

## 2010-09-04 LAB — VITAMIN D 1,25 DIHYDROXY
Vitamin D 1, 25 (OH)2 Total: 30 pg/mL (ref 18–72)
Vitamin D2 1, 25 (OH)2: <8 pg/mL
Vitamin D3 1, 25 (OH)2: 30 pg/mL

## 2010-09-04 NOTE — Op Note (Signed)
Melissa Villa, Melissa Villa               ACCOUNT NO.:  [id number]   MEDICAL RECORD NO.:  [id number]          PATIENT TYPE:  INP   LOCATION:  2899                         FACILITY:  [facility]   PHYSICIAN:  Ines Bloomer, M.D. DATE OF BIRTH:  [birth year]   DATE OF PROCEDURE:  02/28/2006  DATE OF DISCHARGE:                                 OPERATIVE REPORT   PREOPERATIVE DIAGNOSIS:  Nonhealing ulcer of right thoracotomy incision.   POSTOPERATIVE DIAGNOSIS:  Nonhealing ulcer of right thoracotomy incision.   OPERATION PERFORMED:  Excision of ulcer.   SURGEON:  Dr. Patricia Nettle. Edwyna Shell   This patient had a thoracotomy several years previously and had a 1 cm to  1.5 ulcer in the middle of the thoracotomy incision that kept healing and  then breaking down.  It was decided to excise this area to promote complete  healing.  After general anesthesia he was turned to the right lateral  thoracotomy position.  The ulcer was excised with an elliptical incision  over about 3 cm.  Then we explored it below and found no evidence in the  foreign body but healthy tissue.  For this reason we went and although she  was very obese and there was a lot of fat tissue, we decided just to close  this primarily and we did that with an interrupted vertical mattress and  simple sutures in the skin with 3-0 nylon. Dry sterile dressing applied.  The patient returned to the recovery room in stable condition.           ______________________________  Ines Bloomer, M.D.     DPB/MEDQ  D:  02/28/2006  T:  02/28/2006  Job:  [id number]

## 2010-09-04 NOTE — Procedures (Signed)
Melissa Villa, Melissa Villa               ACCOUNT NO.:  [id number]   MEDICAL RECORD NO.:  [id number]          PATIENT TYPE:  OUT   LOCATION:  SLEEP CENTER                 FACILITY:  [facility]   PHYSICIAN:  Clinton D. Maple Hudson, M.D. DATE OF BIRTH:  Sep 09, 1945   DATE OF STUDY:                              NOCTURNAL POLYSOMNOGRAM   REFERRING PHYSICIAN:  Dr. Margarito Liner.   INDICATIONS FOR STUDY:  Hypersomnia with sleep apnea.   EPWORTH SLEEPINESS SCORE:  12/24.   BMI:  53.6.   WEIGHT:  315 pounds.   Home medications were listed and reviewed.  Baseline diagnostic NPSG on  07/27/2005 had reported an a AHI of 153.2 per hour with a short sleep time  and significant baseline hypoxemia.  CPAP titration is requested.   SLEEP ARCHITECTURE:  Total sleep time 298 minutes with sleep efficiency 80%.  Stage I was 11%, stage II 70%, stages III and IV 5%, REM 14% of total sleep  time.  Sleep latency 12 minutes, REM latency 203 minutes, awake after sleep  onset 64 minutes, arousal index 27.6.  No bedtime medication was taken.   RESPIRATORY DATA:  CPAP titration protocol.  CPAP was titrated to 16 CWP,  AHI 4.9 per hour.  A medium ResMed Mirage Quattro full-face mask was used  with heated humidifier.   OXYGEN DATA:  Snoring was prevented by CPAP with oxygen held at 90-93% on  room air with CPAP.   CARDIAC DATA:  Normal sinus rhythm.   MOVEMENT/PARASOMNIA:  Occasional limb jerks with arousal.  The technician  commented on some periods of restlessness causing arousals and awakenings  during which time legs would move without specific for scorable events.  Bathroom x2.   IMPRESSION/RECOMMENDATIONS:  1.  Successful CPAP titration to 16 CWP, AHI 4.9 per hour.  A medium ResMed      Mirage Quattro full-face mask was used with heated humidifier.  2.  Baseline diagnostic NPSG on 07/27/2005 had recorded an AHI of 153.2 per      hour.  3.  Note improved oxygenation with CPAP control holding saturations at 91-  93% on room air.      Clinton D. Maple Hudson, M.D.  Diplomate, Biomedical engineer of Sleep Medicine  Electronically Signed     CDY/MEDQ  D:  09/05/2005 14:01:27  T:  09/06/2005 09:56:58  Job:  [id number]

## 2010-09-04 NOTE — Discharge Summary (Signed)
Melissa Villa, Melissa Villa               ACCOUNT NO.:  [id number]   MEDICAL RECORD NO.:  [id number]          PATIENT TYPE:  INP   LOCATION:  3713                         FACILITY:  [facility]   PHYSICIAN:  Hollace Hayward, M.D.   DATE OF BIRTH:  Dec 10, 1945   DATE OF ADMISSION:  12/06/2006  DATE OF DISCHARGE:  12/08/2006                               DISCHARGE SUMMARY   DISCHARGE DIAGNOSES:  1. Gastroesophageal reflux disease.  2. 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  3. Hypertension.  4. Transient ischemic attack.  5. Congestive heart failure.  6. Obstructive sleep apnea.  7. Chronic obstructive pulmonary disease.  8. Iron deficiency anemia.  9. Hyperlipidemia.  10.History of breast cancer and lumpectomy.  11.History of carcinoid lung tumor status post thoracotomy.  12.Arteriovenous block status post biventricular pacer.  13.Urinary incontinence.  14.Colonic polyps.  15.Chronic sinusitis.   DISCHARGE MEDICATIONS:  1. Hydrochlorothiazide 25 mg daily.  2. Norvasc 10 mg daily.  3. Spiriva 18 mcg 1 inhalation daily.  4. Albuterol MDI 2 puffs as needed.  5. Advair 250/50 mg INH b.i.d.  6. Glucophage 850 mg t.i.d.  7. Lipitor 80 mg daily.  8. Nexium 40 mg daily.  9. Humulin 70/30 mg 50 units in the a.m. and 25 units in the p.m.  10.Zyrtec 10 mg daily.  11.Nasonex 2 sprays each nostril daily.  12.Aggrenox 25/200 mg 1 tablet daily.  13.Lortab 10/500 mg every 6 hours as needed for pain.  14.Avalide 300/25 mg once daily.  15.Atenolol 150 mg daily.  16.Multivitamin daily.  17.Iron 325 mg t.i.d.  18.Ambien CR 6.25 mg q.h.s.   HISTORY OF PRESENT ILLNESS:  This is a 64 year old female with multiple  medical problems who was complaining of progressive early satiety plus  nausea and vomiting (really regurg) and dyspepsia for 1 month.  She also  developed some epigastric pain which is associated with an acid taste in  her mouth.  She has been eating less over the past  couple of weeks and  she also felt like her abdomen was distended.  She felt that since her  abdomen was larger that this was making her baseline shortness of breath  slightly worse.  She denied any fever, chills, chest pain, blood in her  stools, diarrhea, hematemesis, dysuria.   PHYSICAL EXAMINATION:  At time of admission her vitals were temperature  98.1, BP 149/80, pulse of 86, weight was 298 and CBG was 143.  She was  awake, alert and following commands, otherwise her exam was essentially  normal except for some mild tenderness to palpation in the epigastrium.   LABORATORY DATA:  Her admission labs consisted of a lipase of 27, UA of  1.017 specific gravity,  protein of  100, leukocyte esterase was  moderate.  White blood cells were 3 to 6, bacteria was rare.  Sodium of  137, potassium 3.7, chloride 101, bicarb 26, BUN 14, creatinine was 0.7,  glucose 131, white count was 7.1, RBCs hemoglobin of 13.2, platelets  267, absolute neutrophil count 12.6, MCV of 86, anion gap of 10,  bilirubin 0.3, alk phos  71, AST 18, ALT 21, protein 8.1, albumin 3.5,  calcium 9.3.   CONSULTATIONS:  Dr. Elnoria Howard who is a gastroenterologist, was consulted  regarding these symptoms, who suggested getting a gastric emptying study  and if need be an EGD later on.   STUDIES:  Gastric emptying study on 12/08/2006.  Normal gastric emptying  study.   HOSPITAL COURSE:  1. Nausea, vomiting, dyspepsia associated with epigastric abdominal      pain.  She was admitted and kept n.p.o. until her gastric emptying      study.  As stated above, Dr. Elnoria Howard was consulted who agreed with the      gastric emptying study and suggested possible ECG if that was      normal.  The patient had a normal gastric emptying study and at      that time all of her symptoms had resolved.  She was feeding      normally and was without any pain and was without any early satiety      and was eating quite well.  Because of this, Dr. Elnoria Howard felt  that the      EGD could be done on an outpatient basis, if needed.  2. Chronic obstructive sleep apnea problems.  These were cared for      with all of her usual home medications without difficulty.  She was      also placed on CPAP while she slept at night.   On the day of discharge, labs consisted of white count of 5.4,  hemoglobin 12.9, platelets of 255, sodium of 140, potassium of 3.4,  chloride of 102, bicarb 27, BUN of 10, creatinine of 0.5 and glucose of  144.  Total bilirubin of 0.6, alk phos 69, AST 16, ALT 19, total protein  7, albumin 3.1, calcium 8.8.  Temperature was 98.4.  Systolic blood  pressure ranged from 124 to 154 and diastolic blood pressures ranged  from 60 to 76 with pulses ranging 61 to 84 and respiratory rate 18.  Saturation at 96% on room air.      Hollace Hayward, M.D.  Electronically Signed     TE/MEDQ  D:  01/30/2007  T:  01/30/2007  Job:  [id number]

## 2010-09-04 NOTE — H&P (Signed)
[place]. [facility]  Patient:    Melissa Villa, Melissa Villa Visit Number: [id number] MRN: [id number]          Service Type: SUR Location: 3300 [id number] Attending Physician:  Cameron Proud Dictated by:   Adair Patter, P.A.-C. Admit Date:  06/13/2001                           History and Physical  CHIEF COMPLAINT:  Right lung lesion.  HISTORY OF PRESENT ILLNESS:  This is a 65 year old African-American female who was referred to Dr. Algis Downs. [name] by Dr. Valentino Hue. Magrinat for evaluation of a right lung mass.  The patient has a history of breast cancer.  She states that Dr. [name] has been following serial chest x-rays, which the most recent one approximately six weeks ago, revealed a right lung mass.  This mass was confirmed with a CT scan.  The patient was sent to Dr. Edwyna Shell.  The patient states that she does have a cough, but this is dry, without sputum production.  She denies any fever, chills, or weight loss.  She denies any hemoptysis, angina, or cardiac arrhythmias.  She says she does have gastroesophageal reflux disease, and denies resting shortness of breath.  She does state that she gets short of breath with exertion, and has occasional paroxysmal nocturnal dyspnea.  She also reports symptoms of orthopnea.  She denies any symptoms of transient ischemic attack or stroke, amaurosis fugax, or history of pulmonary embolism or deep venous thrombosis.  She says she does have a history of pneumonia, the last time being approximately three weeks ago, that was treated with antibiotics.  PAST MEDICAL HISTORY 1. History of breast cancer. 2. Type 2 diabetes mellitus. 3. Hypertension. 4. Bronchitis. 5. Hypercholesterolemia.  PAST SURGICAL HISTORY 1. Left breast lumpectomy with axillary node dissection. 2. Cholecystectomy. 3. Tonsillectomy. 4. 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  ALLERGIES:  The patient denies any known medication  allergies.  CURRENT MEDICATIONS 1. Glucophage 850 mg p.o. t.i.d. 2. Lipitor 80 mg p.o. q.d. 3. Monopril 20 mg p.o. q.d. 4. _______ 10 mg p.o. q.d. 5. Vioxx 25 mg p.o. q.d. 6. Prilosec 20 mg p.o. q.d. 7. Lasix 80 mg p.o. q.d. 8. Insulin 45 units q.a.m., 35 units q.p.m.  FAMILY HISTORY:  The patients mother is deceased of cancer.  SOCIAL HISTORY:  The patient is married and lives with her husband.  She has three children.  She denies any alcohol use.  She says she smokes approximately 1/2 pack of cigarettes per day, and has done so for 15 years.  REVIEW OF SYSTEMS:  GENERAL:  The patient denies any fever, chills, night sweats, or frequent illnesses.  HEENT:  The patient denies any head injury or loss of consciousness.  Eyes:  The patient denies any glaucoma, cataracts, or visual disturbances.  Ears:  The patient denies any tinnitus, vertigo, hearing loss, or ear infections.  Mouth:  The patient denies any problems with dentition or frequent sore throats.  NECK:  The patient denies any lumps, masses, or pain with range of motion in her neck.  BREASTS:  The patient has a history of breast cancer, for which she had a lumpectomy and axillary node dissection.  CARDIOVASCULAR:  The patient has a history of bradycardia, requiring a pacemaker insertion.  She also has hypertension, for which she takes medication.  Denies any history of angina or myocardial infarction. PULMONARY:  The patient has a  history of pneumonia three weeks ago, which was treated with antibiotics.  Denies any asthma or emphysema.  GI:  The patient denies any nausea, vomiting, diarrhea, constipation, hematochezia, or melena, but does have symptoms of gastroesophageal reflux disease, for which she takes Prilosec.  GENITOURINARY:  The patient denies any urinary tract infections or incontinence.  MUSCULOSKELETAL:  The patient denies any arthritis, arthralgias, or myalgias.  NEUROLOGIC:  The patient denies any memory  loss, depression, stroke, or transient ischemic attack.  PHYSICAL EXAMINATION  VITAL SIGNS:  Blood pressure 120/80, pulse 84 and regular, respirations 16.  GENERAL:  The patient is alert and oriented x 3, is in no distress.  HEENT:  Head is normocephalic, atraumatic.  Eyes:  Pupils equal, round, reactive to light and accommodation.  Extraocular motions intact without scleral icterus or nystagmus.  Ears:  Auditory acuity is grossly intact. Nose:  Nasal patency intact.  Sinuses are nontender.  Mouth is moist without exudates.  NECK:  Supple, without jugular venous distention or lymphadenopathy, or carotid bruits.  No thyromegaly.  CARDIOVASCULAR:  A regular rate and rhythm without murmurs, gallops, or rubs.  ABDOMEN:  Soft, nontender, nondistended.  Positive bowel sounds in all four quadrants.  EXTREMITIES:  No clubbing, cyanosis, or edema.  PERIPHERAL PULSES:  Revealed 2+ carotid and femoral pulses bilaterally.  She had nonpalpable popliteal and posterior tibial pulses.  Her dorsalis pedis pulses are 1+ bilaterally.  NEUROLOGIC:  Cranial nerves II-XII are grossly intact.  No focal neurological deficits are noted.  She had 5+ equal muscle strength in all four extremities. She had a steady gait without the use of assistive devices.  IMPRESSION:  Right lung mass.  PLAN:  The patient will undergo a right video-assisted thoracoscopy with a wedge resection of the right lung mass by Dr. Edwyna Shell. Dictated by:   Adair Patter, P.A.-C. Attending Physician:  Cameron Proud DD:  06/09/01 TD:  06/09/01 Job: [id number]

## 2010-09-04 NOTE — Procedures (Signed)
[place]. [facility]  Patient:    Melissa Villa, Melissa Villa Visit Number: [id number] MRN: [id number]          Service Type: SUR Location: 3300 [id number] Attending Physician:  Cameron Proud Dictated by:   Cliffton Asters Ivin Booty, M.D. Proc. Date: 06/13/01 Admit Date:  06/13/2001                             Procedure Report  PROCEDURE PERFORMED:  Insertion of epidural catheter for postoperative analgesia.  ANESTHESIOLOGIST:  Cliffton Asters. Ivin Booty, M.D.  DESCRIPTION OF PROCEDURE:  [name] was brought to the operating room today by Dr. Edwyna Shell for resection of a lung mass which was performed via right thoracotomy incision.  She had been spoken with previously by Dr. Michelle Piper about postoperative analgesia using the epidural route.  She agreed to this.  At the conclusion of the procedure, Dr. Michelle Piper was detained in another room [name] was asked to place the epidural.  She remained in the left lateral position. Her back was prepped in a sterile fashion.  At the T9-10 interspace, a 17 gauge Tuohy needle was passed to a loss of resistance to air 2 cc.  There was a negative aspiration of blood or CSF from the needle and an 18 gauge epidural catheter was passed through the needle to a depth of 4 cm below the end of the needle.  The needle was removed and the catheter was secured.  There was negative aspiration of blood or CSF from the catheter and a bolus injection of 12 cc of 0.5% lidocaine containing 100 mcg of fentanyl was given through the catheter.  Then the patient was turned supine, suctioned, extubated in the operating room.  She was taken to the PACU in good condition.  She Melissa Villa be followed by the anesthesia department for several days until removal of the catheter is appropriate. Dictated by:   Cliffton Asters Ivin Booty, M.D. Attending Physician:  Cameron Proud DD:  06/13/01 TD:  06/13/01 Job: [id number]

## 2010-09-15 ENCOUNTER — Telehealth: Payer: Self-pay | Admitting: Dietician

## 2010-09-15 NOTE — Telephone Encounter (Signed)
Called to follow up on decrease in lantus and increase in Januvia. Melissa Villa reports her blood sugars are 120-130-140 throughout the day.  138 this am. We discussed her vitamin D results, her continued profound lack of energy and that she is has been successful at getting at least 2 servings of vegetables daily and tomato juice . She confirmed accuracy of Vitamin D content of supplements and that she takes both daily.  Told Melissa Villa I would contact her as needed.

## 2010-10-07 ENCOUNTER — Ambulatory Visit (INDEPENDENT_AMBULATORY_CARE_PROVIDER_SITE_OTHER): Payer: Medicare Other | Admitting: Internal Medicine

## 2010-10-07 ENCOUNTER — Ambulatory Visit (INDEPENDENT_AMBULATORY_CARE_PROVIDER_SITE_OTHER): Payer: Medicare Other | Admitting: Dietician

## 2010-10-07 ENCOUNTER — Encounter: Payer: Self-pay | Admitting: Internal Medicine

## 2010-10-07 VITALS — BP 151/69 | HR 76 | Temp 98.2°F | Ht 64.0 in | Wt 284.6 lb

## 2010-10-07 DIAGNOSIS — E119 Type 2 diabetes mellitus without complications: Secondary | ICD-10-CM

## 2010-10-07 DIAGNOSIS — G4733 Obstructive sleep apnea (adult) (pediatric): Secondary | ICD-10-CM

## 2010-10-07 DIAGNOSIS — R5381 Other malaise: Secondary | ICD-10-CM

## 2010-10-07 DIAGNOSIS — J449 Chronic obstructive pulmonary disease, unspecified: Secondary | ICD-10-CM

## 2010-10-07 DIAGNOSIS — J984 Other disorders of lung: Secondary | ICD-10-CM | POA: Insufficient documentation

## 2010-10-07 DIAGNOSIS — D509 Iron deficiency anemia, unspecified: Secondary | ICD-10-CM

## 2010-10-07 DIAGNOSIS — E7800 Pure hypercholesterolemia, unspecified: Secondary | ICD-10-CM

## 2010-10-07 DIAGNOSIS — R0989 Other specified symptoms and signs involving the circulatory and respiratory systems: Secondary | ICD-10-CM

## 2010-10-07 LAB — GLUCOSE, CAPILLARY: Glucose-Capillary: 84 mg/dL (ref 70–99)

## 2010-10-07 MED ORDER — INSULIN GLARGINE 100 UNIT/ML ~~LOC~~ SOLN: 15.0000 [IU] | Freq: Every day | SUBCUTANEOUS | Status: DC

## 2010-10-07 MED ORDER — EXENATIDE 5 MCG/0.02ML ~~LOC~~ SOPN: 5.0000 ug | PEN_INJECTOR | Freq: Two times a day (BID) | SUBCUTANEOUS | Status: DC

## 2010-10-07 NOTE — Assessment & Plan Note (Signed)
Hemoglobin A1C  Date Value Range Status  07/29/2010 7.3   Final  03/30/2010 7.1   Final    Patient's diabetes mellitus is reasonably well controlled, with hemoglobin A1c slightly above target range.  However, following discussion with our diabetes educator/nutritionist Jamison Neighbor, our consensus is that patient would benefit from weight loss and that we may help facilitate that by reducing or stopping her Lantus insulin and using Byetta as a part of her diabetes regimen.  I discussed the potential side effects of Byetta with Ms. Novinger and the rationale for trying to adjust her regimen in order to help her lose weight while maintaining good control of her diabetes.  She would like to try the Byetta, so the plan is to stop Januvia, reduce Lantus insulin to 15 units daily, and start Byetta 5 mcg twice a day with meals.  I advised patient to check her blood sugars 3-4 times a day and to call immediately if she has any low or high values.  I also advised her to call if she has abdominal pain or other gastrointestinal symptoms after starting the Byetta.  Will check a hemoglobin A1c at the next visit, and the patient will follow up with Jamison Neighbor as well.

## 2010-10-07 NOTE — Assessment & Plan Note (Addendum)
Patient reports ongoing problems with her CPAP mask leaking; we tried having her mask replaced and this did not seem to improve the situation.  I'm concerned that her daytime fatigability may be due to inadequate treatment of her sleep apnea.  Given her worsening problems with fatigue and dyspnea, her obstructive sleep apnea, and her recent pulmonary function study which showed mixed obstructive and restrictive lung disease, the plan is to refer her to pulmonology.

## 2010-10-07 NOTE — Patient Instructions (Addendum)
Please return for fasting blood work within one week. Stop Januvia. Reduce Lantus insulin dose to 15 units daily. Start Byetta 5 mcg subcutaneously twice a day with a meal. Continue to check your blood sugars 3-4 times each day and call if you have any values below 80 or over 250, or if any other problems. Please followup with diabetes educator Jamison Neighbor as scheduled. An appointment will be made for you with a pulmonary physician.

## 2010-10-07 NOTE — Patient Instructions (Signed)
Decrease salt intake: use fresh or frozen vegetables, rinse any that are canned.    Use healthy choice frozen entrees     Use salt free seasonings(pepper, onion and garlic powder, cayenne pepper, rosemary, sage)  and a teaspoon of canola or olive oil instead of fat back.  Eat low fat protein: plain hamburger, eggs with veggies, chicken, [place] and fish without skin and baked boiled, grilled or broiled. Cut back on cheese, bacon, sausage and fried foods.  You DO NOT need a bedtime snack with lantus (can stop bedtime snack).  Try to eat 2 carb portions ( 30 grams) at breakfast lunch and dinner every day.  Please call [phone]) if most of your blood sugar before meals are less than 100.   Please talk to Dr. Meredith Pel about your lasix and that you are peeing mostly in the evening.

## 2010-10-07 NOTE — Assessment & Plan Note (Signed)
As noted, pulmonary function studies on 08/05/2010 showed mixed obstructive and restrictive lung disease with no significant response to bronchodilator.  Patient's obesity is likely a contributor to her problem, and she also has obstructive sleep apnea with problems as noted above.  As above, the plan is pulmonology referral.

## 2010-10-07 NOTE — Assessment & Plan Note (Signed)
Patient has now been back on Crestor for more than 2 months; the plan is to have her return within the next week for a fasting lipid panel and a comprehensive metabolic panel.

## 2010-10-07 NOTE — Progress Notes (Addendum)
  Subjective:    Patient ID: Melissa Villa, female    DOB: 1945-12-06, 65 y.o.   MRN: [id number]  HPI Patient returns for followup of her adult onset diabetes mellitus, hyperlipidemia, obstructive sleep apnea, and other chronic medical problems.  Today she reports worsening fatigability, difficulty sleeping, exertional dyspnea, and bilateral chronic leg pain which is not well relieved by her current pain medication.  She reports that she is compliant with her CPAP, but has had ongoing difficulty with leaking around her mask and problems with the mask fit.  Her blood sugars have been well controlled on her current regimen, with no low values and over 70% of her measured values within the target range.  She stopped taking Neurontin because it made her more drowsy and did not help with her chronic bilateral leg pain; otherwise, she reports that she is compliant with her medications.   Review of Systems  Constitutional: Negative for fever and chills.  Respiratory: Positive for shortness of breath and wheezing (Occasional.).   Cardiovascular: Negative for chest pain and leg swelling.  Gastrointestinal: Negative for nausea, vomiting, abdominal pain and blood in stool.  Genitourinary: Negative for dysuria and frequency.       Objective:   Physical Exam  Cardiovascular: S1 normal and S2 normal.  Exam reveals no S3 and no S4.   Murmur heard.  Systolic murmur is present with a grade of 1/6  Pulmonary/Chest: Effort normal and breath sounds normal. She has no wheezes. She has no rales.  Abdominal: Soft. Bowel sounds are normal. She exhibits no distension. There is no tenderness. There is no guarding.  Musculoskeletal: She exhibits no edema.          Assessment & Plan:

## 2010-10-07 NOTE — Progress Notes (Signed)
Medical Nutrition Therapy:  Appt start time: 1000 end time:  1100.  Assessment:  Primary concerns today: Weight management, Blood sugar control and low energy. Diet recall reveals high salt intake and frequent saturated fat choices. Sleep and energy level worse the past month. Weigh increase- patient reports swelling. Continues eating pattern that includes Meal 2 and 1+ snacks per day.      Usual physical activity includes  ADLs only and reports that these are taking considerable amounts of time.  Diagnosis and Intervention:  Progress Towards Goal(s):  In progress   Nutritional Diagnosis:   NB-1.1 Food and nutrition-related knowledge deficit As related to improving vegetable intake improving As evidenced by patient report of eating 2 cups vegetables per day  ~ 50% of the time.  NB 1.1 Food and nutrition related knowledge deficit as related to not knowing food sources of sodium, carbs and fat as evidenced by her food recall of high salt and fat choices  .  Interventions: 1- Counseling in how to decrease salt and fat in diet. 2- Goal setting: specific, measurable, attainable, realistic and timed goal set to increase % of days that she eats 2 cups vegetables per day. 3- Coordination of care- spoke with Dr. Meredith Pel regarding patient goals and care. 4- review of meter download: Much improvement noted:lowest 70 and highest value 248 with pattern of increasing blood sugars over course of day from fasting average of 125 to bedtime average of 148mg /dl 5- Counseling on carb counting: meal plan set for 30-45 grams per meal and < 15 grams for snacks. Used label reading and carb money to illustrate how patient can put to use what she knows at home.   Monitoring/Evaluation:  Dietary intake in 4 week(s)

## 2010-10-07 NOTE — Assessment & Plan Note (Addendum)
Pulmonary function tests on 08/05/2010 showed mixed obstructive and restrictive lung disease with no significant response to bronchodilator, and decreased diffusion capacity that corrected to normal range when adjusted for the inhaled alveolar volume.  As noted, plan is for pulmonology referral given her ongoing problems with dyspnea on exertion and fatigability; and the complicating factor of her obstructive sleep apnea and difficulties with CPAP mask fit.  Will continue current inhaled bronchodilator regimen.

## 2010-10-07 NOTE — Assessment & Plan Note (Signed)
HGB  Date Value Range Status  06/24/2010 11.5* 11.6-15.9 (g/dL) Final     Hemoglobin  Date Value Range Status  12/03/2009 11.1* 12.0-15.0 (g/dL) Final     Ferritin  Date Value Range Status  07/29/2010 53  10-291 (ng/mL) Final    Patient reports that she has been taking ferrous sulfate regularly since her last visit.  She does report constipation as a side effect of the ferrous sulfate.  I advised her to stop the ferrous sulfate, and we will check a CBC and ferritin level today.

## 2010-10-08 ENCOUNTER — Other Ambulatory Visit: Payer: Self-pay | Admitting: Dietician

## 2010-10-08 ENCOUNTER — Other Ambulatory Visit: Payer: Medicare Other

## 2010-10-08 DIAGNOSIS — D509 Iron deficiency anemia, unspecified: Secondary | ICD-10-CM

## 2010-10-08 DIAGNOSIS — E7800 Pure hypercholesterolemia, unspecified: Secondary | ICD-10-CM

## 2010-10-08 DIAGNOSIS — E119 Type 2 diabetes mellitus without complications: Secondary | ICD-10-CM

## 2010-10-08 LAB — LIPID PANEL
Cholesterol: 155 mg/dL (ref 0–200)
HDL: 33 mg/dL — ABNORMAL LOW (ref >39)
Total CHOL/HDL Ratio: 4.7 Ratio
Triglycerides: 114 mg/dL (ref <150)
VLDL: 23 mg/dL (ref 0–40)

## 2010-10-08 LAB — CBC WITH DIFFERENTIAL/PLATELET
Basophils Relative: 0 % (ref 0–1)
HCT: 37.9 % (ref 36.0–46.0)
Hemoglobin: 11.8 g/dL — ABNORMAL LOW (ref 12.0–15.0)
Lymphs Abs: 1.5 10*3/uL (ref 0.7–4.0)
MCHC: 31.1 g/dL (ref 30.0–36.0)
Monocytes Absolute: 0.5 10*3/uL (ref 0.1–1.0)
Monocytes Relative: 9 % (ref 3–12)
Neutro Abs: 3 10*3/uL (ref 1.7–7.7)

## 2010-10-08 NOTE — Telephone Encounter (Signed)
Called [facility] to request increase in self  monitoring supplies. Was todl this will start with her next shipment which is due now.   Request increase in pen needles for patient to use for Byetta as well as insulin.

## 2010-10-09 ENCOUNTER — Encounter: Payer: Self-pay | Admitting: Internal Medicine

## 2010-10-09 LAB — COMPLETE METABOLIC PANEL WITH GFR
ALT: 17 U/L (ref 0–35)
CO2: 24 mEq/L (ref 19–32)
Calcium: 9.4 mg/dL (ref 8.4–10.5)
Chloride: 107 mEq/L (ref 96–112)
Creat: 0.86 mg/dL (ref 0.50–1.10)
GFR, Est African American: >60 mL/min (ref >60)
GFR, Est Non African American: >60 mL/min (ref >60)
Glucose, Bld: 97 mg/dL (ref 70–99)
Total Bilirubin: 0.2 mg/dL — ABNORMAL LOW (ref 0.3–1.2)

## 2010-10-09 MED ORDER — "PEN NEEDLES 5/16"" 31G X 8 MM MISC": Status: DC

## 2010-10-12 ENCOUNTER — Encounter: Payer: Self-pay | Admitting: Internal Medicine

## 2010-10-12 ENCOUNTER — Ambulatory Visit (INDEPENDENT_AMBULATORY_CARE_PROVIDER_SITE_OTHER): Payer: Medicaid Other | Admitting: Internal Medicine

## 2010-10-12 VITALS — BP 140/70 | HR 85 | Ht 64.5 in | Wt 286.2 lb

## 2010-10-12 DIAGNOSIS — G4733 Obstructive sleep apnea (adult) (pediatric): Secondary | ICD-10-CM

## 2010-10-12 DIAGNOSIS — J449 Chronic obstructive pulmonary disease, unspecified: Secondary | ICD-10-CM

## 2010-10-12 DIAGNOSIS — J984 Other disorders of lung: Secondary | ICD-10-CM

## 2010-10-12 NOTE — Progress Notes (Signed)
  Subjective:    Patient ID: Melissa Villa, female    DOB: 15-Sep-1945, 65 y.o.   MRN: [id number]  HPI 10/12/10- 24 yoF former smoker seen at kind request of Dr Meredith Pel for sleep evaluation. She complains of difficulty maintaining sleep, waking 4-5 x/ night. Has had 2 sleep studies positive for OSA. Sleeps every night with CPAP, but can't maintain sleep and fights daytime somnolence.  CPAP keeps her awake, but cna't fall asleep without it. The mask cuts her nose and leaks. Has tried Ambien, Rozerem and others. Lives alone and unsure about snoring or leg movement.  Has had a significant cough with a cold but feeling better. R lobectomy 2003 for ? Cancer vs benign nodule. Lumpectomy breast for cancer.  Review of Systems Constitutional:   No weight loss, night sweats,  Fevers, chills, fatigue, lassitude. HEENT:   No headaches,  Difficulty swallowing,  Tooth/dental problems,  Sore throat,                No sneezing, itching, ear ache, nasal congestion, post nasal drip,   CV:  No chest pain,  Orthopnea, PND, swelling in lower extremities, anasarca, dizziness, palpitations  GI  No heartburn, indigestion, abdominal pain, nausea, vomiting, diarrhea, change in bowel habits, loss of appetite  Resp: Shortness of breath with exertion or at rest.   Non-productive cough,  No coughing up of blood.  No change in color of mucus.  No wheezing.   Skin: no rash or lesions.  GU: no dysuria, change in color of urine, no urgency or frequency.  No flank pain.  MS:  No joint pain or swelling.  No decreased range of motion.  No back pain.  Psych:  No change in mood or affect. No depression or anxiety.  No memory loss.      Objective:   Physical Exam General- Alert, Oriented, Affect-appropriate, Distress- none acute  Obese, wig  Skin- rash-none, lesions- none, excoriation- none  Lymphadenopathy- none  Head- atraumatic  Eyes- Gross vision intact, PERRLA, conjunctivae clear secretions  Ears- Hearing,  canals, Tm normal  Nose- Clear, Septal dev, mucus, polyps, erosion, perforation   Scar bridge of nose  Throat- Mallampati II , mucosa clear , drainage- none, tonsils- atrophic  Neck- flexible , trachea midline, no stridor , thyroid nl, carotid no bruit  Chest - symmetrical excursion , unlabored    Right pacemaker     Heart/CV- RRR , 1/6 systolic murmur left upper sternal area , no gallop  , no rub, nl s1 s2                     - JVD- none , edema- none, stasis changes- none, varices- none     Lung- clear to P&A, wheeze- none,  , dullness-none, rub- none                      Harsh cough     Chest wall- atraumatic, no scar  Abd- tender-no, distended-no, bowel sounds-present, HSM- no  Br/ Gen/ Rectal- Not done, not indicated  Extrem- cyanosis- none, clubbing, none, atrophy- none, strength- nl  Neuro- grossly intact to observation         Assessment & Plan:

## 2010-10-12 NOTE — Assessment & Plan Note (Signed)
She complains of ongoing frequent waking after sleep onset, morning headache, mask leaks or cuts face. We will have her meet with the daytime Sleep Center staff for CPAP fitting and desensitization to see if we can get her more comfortable. After that we can reasses whether an autotitration for pressure or a sleep med adjustment could help.

## 2010-10-12 NOTE — Progress Notes (Signed)
Pt was called and made awared of labs (cholesterol and iron) and she can stop ferrous sulfate per Dr. Meredith Pel.

## 2010-10-12 NOTE — Patient Instructions (Signed)
Gi Or Norman- refer to daytime sleep center staff for CPAP fitting and desensitization

## 2010-10-13 ENCOUNTER — Telehealth: Payer: Self-pay | Admitting: Dietician

## 2010-10-13 NOTE — Telephone Encounter (Signed)
Needed instructions on using and priming  Byetta pen. Went over with patient on phone as well as potential side effects. Patient verbalized understanding to taking it with breakfast and supper and that the doses should be 6 hours apart from each other and can be taken at same time as lantus. . She is starting the Byetta today.  Melissa Villa

## 2010-10-15 NOTE — Assessment & Plan Note (Signed)
Cough- watch till clear

## 2010-10-15 NOTE — Assessment & Plan Note (Signed)
She has a concerning cough but says it is new as with a cold, and getting bette.

## 2010-10-28 ENCOUNTER — Other Ambulatory Visit: Payer: Self-pay | Admitting: Dietician

## 2010-10-28 NOTE — Telephone Encounter (Signed)
Called patient to confirm she wants to use [facility] for her testing supplies. She is doing well will the Byetta and decreased dose of insulin, has not seen a big change in blood sugars and desires to increase to standard dose of 10 mcg twice daily. Advised her to call for blood sugars < 70 or is all staying < 100 after she begins the new dose.  Will request new rx for increased dose and also for testing supplies.

## 2010-10-28 NOTE — Telephone Encounter (Signed)
Called patient and left message that she is due to to come in  to office to review both blood sugars and dietary intake on the current dose of Byetta and lantus. Asked her to call CDE or front office to schedule

## 2010-10-28 NOTE — Telephone Encounter (Signed)
I would prefer for her to be seen and her glucose record reviewed before increasing the Byetta; we may need to further decrease the insulin.

## 2010-11-02 ENCOUNTER — Encounter: Payer: Self-pay | Admitting: Internal Medicine

## 2010-11-03 ENCOUNTER — Ambulatory Visit (INDEPENDENT_AMBULATORY_CARE_PROVIDER_SITE_OTHER): Payer: Medicare Other | Admitting: Dietician

## 2010-11-03 ENCOUNTER — Ambulatory Visit (INDEPENDENT_AMBULATORY_CARE_PROVIDER_SITE_OTHER): Payer: Medicaid Other | Admitting: Internal Medicine

## 2010-11-03 ENCOUNTER — Encounter: Payer: Self-pay | Admitting: Internal Medicine

## 2010-11-03 DIAGNOSIS — I10 Essential (primary) hypertension: Secondary | ICD-10-CM

## 2010-11-03 DIAGNOSIS — E119 Type 2 diabetes mellitus without complications: Secondary | ICD-10-CM

## 2010-11-03 DIAGNOSIS — I442 Atrioventricular block, complete: Secondary | ICD-10-CM

## 2010-11-03 DIAGNOSIS — Z950 Presence of cardiac pacemaker: Secondary | ICD-10-CM | POA: Insufficient documentation

## 2010-11-03 LAB — PACEMAKER DEVICE OBSERVATION
AL IMPEDENCE PM: 512.5 Ohm
ATRIAL PACING PM: 14
BAMS-0001: 150 {beats}/min
BAMS-0003: 60 {beats}/min
BATTERY VOLTAGE: 2.9629 V
VENTRICULAR PACING PM: 1

## 2010-11-03 LAB — GLUCOSE, CAPILLARY: Glucose-Capillary: 168 mg/dL — ABNORMAL HIGH (ref 70–99)

## 2010-11-03 NOTE — Patient Instructions (Signed)
Follow up in 4 weeks with Jamison Neighbor [phone]  Increase Byetta to 10 mcg twice a day- 5-15 minutes before breakfast and before supper.  Try to walk for 5- 10  minutes 3 times daily for 5 days a week.

## 2010-11-03 NOTE — Assessment & Plan Note (Signed)
Her device is working normally. We'll plan to recheck in several months. 

## 2010-11-03 NOTE — Progress Notes (Signed)
HPI Melissa Villa returns today for followup. She is a former patient of Dr. Aleen Campi. She has a history of symptomatic bradycardia, hypertension, diabetes, morbid obesity, and chronic sleep apnea. The patient's main complaint today is chronic fatigue and weakness and constantly feeling tired. She knows that she is not sleeping well at night. Her CPAP mask has not been staying on. She is scheduled to undergo adjustment by Dr. Maple Hudson. She denies frank syncope. Trace peripheral edema has been present. No Known Allergies   Current Outpatient Prescriptions  Medication Sig Dispense Refill  . albuterol (PROAIR HFA) 108 (90 BASE) MCG/ACT inhaler Inhale 2 puffs into the lungs every 6 (six) hours as needed.        [name] amLODipine (NORVASC) 10 MG tablet Take 1 tablet (10 mg total) by mouth daily.  31 tablet  11  . Calcium Carbonate-Vitamin D (CALCIUM 600+D) 600-400 MG-UNIT per tablet Take 1 tablet by mouth 2 (two) times daily.        . carvedilol (COREG) 12.5 MG tablet Take 1 and 1/2 tablets twice a day.      . cetirizine (ZYRTEC) 10 MG tablet Take 10 mg by mouth daily.        [name] Dexlansoprazole (DEXILANT) 60 MG capsule Take 60 mg by mouth every morning. Take 15 minutes before breakfast.      . dipyridamole-aspirin (AGGRENOX) 25-200 MG per 12 hr capsule Take 1 capsule by mouth 2 (two) times daily.        [name] exenatide (BYETTA 5 MCG PEN) 5 MCG/0.02ML SOLN Inject into the skin 2 (two) times daily with a meal.        . Fluticasone-Salmeterol (ADVAIR DISKUS) 250-50 MCG/DOSE AEPB Inhale 1 puff into the lungs 2 (two) times daily.        . furosemide (LASIX) 20 MG tablet Take 1 tablet (20 mg total) by mouth daily.  30 tablet  3  . HYDROcodone-acetaminophen (NORCO) 5-325 MG per tablet Take 1 tablet by mouth 4 (four) times daily as needed for pain.  120 tablet  3  . insulin glargine (LANTUS) 100 UNIT/ML injection Inject 15 Units into the skin daily.  10 mL    . Insulin Pen Needle (PEN NEEDLES 31GX5/16") 31G X 8 MM MISC Use  as directed for Insulin and Byetta injections. Dx Code 250.00  200 each  12  . metFORMIN (GLUCOPHAGE) 850 MG tablet Take 1 tablet (850 mg total) by mouth 3 (three) times daily.  90 tablet  6  . mometasone (NASONEX) 50 MCG/ACT nasal spray 2 sprays by Nasal route daily.       . multivitamin (THERAGRAN) per tablet Take 1 tablet by mouth daily.        . rosuvastatin (CRESTOR) 5 MG tablet Take 1 tablet (5 mg total) by mouth daily.  30 tablet  6  . tiotropium (SPIRIVA) 18 MCG inhalation capsule Place 1 capsule (18 mcg total) into inhaler and inhale daily.  30 capsule  11  . valsartan (DIOVAN) 160 MG tablet Take 160 mg by mouth 2 (two) times daily.           Past Medical History  Diagnosis Date  . CHF (congestive heart failure)      2-D echo 02/19/2009 showed left ventricle cavity size normal; systolic function normal, estimated ejection fraction 55%; wall motion normal; no regional wall motion abnormalities; PA peak pressure 47mm Hg.  [name] COPD (chronic obstructive pulmonary disease)     Pulmonary function tests on 08/05/2010 showed mixed obstructive  and restrictive lung disease with no significant response to bronchodilator, and decreased diffusion capacity that corrects to normal range when adjusted for the inhaled alveolar volume.  . Hypertension   . TIA (transient ischemic attack)   . Anemia   . 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  Patient is followed by Dr. [name] at the cancer center.  . Degenerative joint disease   . AV block, complete     S/P dual-chamber for symptomatic episodes of bradycardia and complete heart block.  . Pacemaker     S/P dual-chamber for symptomatic episodes of bradycardia and  complete heart block.  . Adenomatous polyp of colon     Tubular adenomas X 2 found 06/1999; negative screening colonoscopy 02/13/2004 by Dr. [name].  . Hypercholesteremia   . Obstructive sleep apnea     Baseline diagnostic nocturnal polysomnogram on July 27, 2005 showed severe obstructive sleep apnea/hypopnea syndrome, with AHI 153.2 per hour.  Last nocturnal polysomnogram done for CPAP titration was 05/01/2009; CPAP was titrated to 13 CWP, AHI 1.8 per hour using a large Respironics FitLife Full Face Mask with heated humidifier.  . Peripheral autonomic neuropathy due to diabetes mellitus   . 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 [name]  . Constipation   . Hand pain   . Palpitation   . Rib pain     right sided   . DM type 2 (diabetes mellitus, type 2)   . Skin rash   . Urinary incontinence   . Proliferative diabetic retinopathy     S/P panretinal photocoagulation by Dr. [name]  . Vitreous hemorrhage     With tractional detachment, left eye; S/P posterior vitrectomy with membrane peel by Dr. [name] 04/06/2005  . Dyspnea on exertion     Cardiac cath 06/17/2005 by Dr. [name] showed LVEF 50-55%, 30% proximal LAD stenosis, small diagonals.     ROS:   All systems reviewed and negative except as noted in the HPI.   Past Surgical History  Procedure Date  . 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  Patient is followed by Dr. [name] at the cancer center.  . Lung lobectomy 06/13/2001    S/P right lower lobe lobectomy 06/13/2001 by Dr. [name]  . Pacemaker insertion 04/20/2001  .  Cholecystectomy   . Tonsillectomy      Family History  Problem Relation Age of Onset  . Breast cancer Mother 59    Deceased 23-Sep-2002  . Diabetes type II Mother   . Diabetes type II Brother   . Hypertension Brother   . Heart attack Brother   . Stroke Brother   . Hypertension Brother   . Hypertension Sister   . Cervical cancer Neg Hx   . Colon cancer Neg Hx      History   Social History  .  Marital Status: Widowed    Spouse Name: N/A    Number of Children: N/A  . Years of Education: N/A   Occupational History  . Not on file.   Social History Main Topics  . Smoking status: Former Smoker -- 1.0 packs/day for 15 years    Types: Cigarettes    Quit date: 04/19/2001  . Smokeless tobacco: Never Used  . Alcohol Use: No  . Drug Use: No  . Sexually Active: Not on file   Other Topics Concern  . Not on file   Social History Narrative  . No narrative on file     BP 143/67  Pulse 82  Ht 5\' 4"  (1.626 m)  Wt 280 lb (127.007 kg)  BMI 48.06 kg/m2  Physical Exam:  Morbidly obese appearing NAD HEENT: Unremarkable Neck:  No JVD, no thyromegally Lymphatics:  No adenopathy Back:  No CVA tenderness Lungs:  Clear HEART:  Regular rate rhythm, no murmurs, no rubs, no clicks Abd:  Soft, obese, positive bowel sounds, no organomegally, no rebound, no guarding Ext:  2 plus pulses, trace bilaterally edema, no cyanosis, no clubbing Skin:  No rashes no nodules Neuro:  CN II through XII intact, motor grossly intact  DEVICE  Normal device function.  See PaceArt for details.   Assess/Plan:

## 2010-11-03 NOTE — Progress Notes (Signed)
Medical Nutrition Therapy:  Appt start time: 1000 end time:  1100.  Assessment:  Primary concerns today: Weight management and Blood sugar control. Diet recall reveals high salt intake and frequent saturated fat choices. Sleep and energy level better for the past month. Weigh decreased by 5 # in one month, but back to baseline prior to swelling.      Usual physical activity includes walks at [employer] a few times a week in sandals.   Diagnosis and Intervention:  Progress Towards Goal(s):  In progress   Nutritional Diagnosis:   NB-1.1 Food and nutrition-related knowledge deficit As related to improving vegetable intake improving As evidenced by patient report of eating 2 cups vegetables per day Not improving as patient says she has limited selection of vegetables  ~ 50% of the time.  NB 1.1 Food and nutrition related knowledge deficit as related to not knowing food sources of sodium, carbs and fat as evidenced by her food recall of high salt and fat choices  .Improving as evidenced by patient not eating nay fried foods.  Interventions: 1- review of meter download: Lost excellent control of blood sugars ~ same time she started Byetta. Lowest 70 and highest value 369 with pattern of increasing blood sugars over course of day and over last month from fasting average of 135 to bedtime average of 164-173mg /dl 2- Goal setting: specific, measurable, attainable, realistic and timed goal set to increase % of days that she eats 2 cups vegetables and activity- wear tennis shoes and socks [place] [employer] 3- Coordination of care- request RX for increased dose of Byetta- 10 mcg twice daily.    Monitoring/Evaluation:  Dietary intake in 4 week(s)

## 2010-11-03 NOTE — Patient Instructions (Signed)
Your physician wants you to follow-up in: 12 months with Dr Court Joy will receive a reminder letter in the mail two months in advance. If you don't receive a letter, please call our office to schedule the follow-up appointment.  Remote monitoring is used to monitor your Pacemaker of ICD from home. This monitoring reduces the number of office visits required to check your device to one time per year. It allows [place] to keep an eye on the functioning of your device to ensure it is working properly. You are scheduled for a device check from home on 02/04/2011. You may send your transmission at any time that day. If you have a wireless device, the transmission will be sent automatically. After your physician reviews your transmission, you will receive a postcard with your next transmission date.

## 2010-11-03 NOTE — Assessment & Plan Note (Signed)
Her blood pressure remains fairly well controlled despite her morbid obesity. She will continue her current medical therapy, maintain a low-sodium diet, and attempt to lose weight.

## 2010-11-05 ENCOUNTER — Telehealth: Payer: Self-pay | Admitting: Dietician

## 2010-11-05 MED ORDER — EXENATIDE 10 MCG/0.04ML ~~LOC~~ SOPN: 10.0000 ug | PEN_INJECTOR | Freq: Two times a day (BID) | SUBCUTANEOUS | Status: DC

## 2010-11-05 MED ORDER — INSULIN GLARGINE 100 UNIT/ML ~~LOC~~ SOLN: 8.0000 [IU] | Freq: Every day | SUBCUTANEOUS | Status: DC

## 2010-11-05 NOTE — Progress Notes (Signed)
After reviewing home [facility]G record, plan is to decrease Lantus to a dose of 8 units daily and increase Byetta to a dose of 10 mcg twice a day with meals.

## 2010-11-05 NOTE — Progress Notes (Signed)
Addended by: Margarito Liner on: 11/05/2010 03:31 PM   Modules accepted: Orders

## 2010-11-05 NOTE — Telephone Encounter (Signed)
Called patient and gave her new orders for lantus insulin 8 units starting today and 10 mcg byetta with breakfast and supper. Patient verbalized understanding

## 2010-11-05 NOTE — Telephone Encounter (Signed)
Has been out of test strips for some time. Wants to change from [facility] mail order to  [facility] for  accu chek aviva strips, fastclix lancing device and lancets  and pen needles. CDE called [facility] and requested new set up. Form to be faxed to doctor today

## 2010-11-12 ENCOUNTER — Encounter: Payer: Self-pay | Admitting: Internal Medicine

## 2010-11-12 ENCOUNTER — Ambulatory Visit (INDEPENDENT_AMBULATORY_CARE_PROVIDER_SITE_OTHER): Payer: Medicare Other | Admitting: Internal Medicine

## 2010-11-12 ENCOUNTER — Ambulatory Visit (HOSPITAL_BASED_OUTPATIENT_CLINIC_OR_DEPARTMENT_OTHER): Payer: Medicare Other | Attending: Internal Medicine

## 2010-11-12 VITALS — BP 130/70 | HR 84 | Ht 64.5 in | Wt 280.8 lb

## 2010-11-12 DIAGNOSIS — G4733 Obstructive sleep apnea (adult) (pediatric): Secondary | ICD-10-CM

## 2010-11-12 DIAGNOSIS — G4710 Hypersomnia, unspecified: Secondary | ICD-10-CM | POA: Insufficient documentation

## 2010-11-12 DIAGNOSIS — G4730 Sleep apnea, unspecified: Secondary | ICD-10-CM | POA: Insufficient documentation

## 2010-11-12 NOTE — Patient Instructions (Signed)
Order- [facility]- CPAP titration sleep study   Dx OSA

## 2010-11-12 NOTE — Progress Notes (Signed)
Subjective:    Patient ID: Melissa Villa, female    DOB: [birth year], 65 y.o.   MRN: [id number]  HPI  Subjective:    Patient ID: Melissa Villa, female    DOB: [birth year], 65 y.o.   MRN: [id number]  HPI 10/12/10- 65 yoF former smoker seen at kind request of Dr Meredith Pel for sleep evaluation. She complains of difficulty maintaining sleep, waking 4-5 x/ night. Has had 2 sleep studies positive for OSA. Sleeps every night with CPAP, but can't maintain sleep and fights daytime somnolence.  CPAP keeps her awake, but cna't fall asleep without it. The mask cuts her nose and leaks. Has tried Ambien, Rozerem and others. Lives alone and unsure about snoring or leg movement.  Has had a significant cough with a cold but feeling better. R lobectomy 2003 for ? Cancer vs benign nodule. Lumpectomy breast for cancer.  11/12/10- 27 yoF former smoker followed for OSA, restrictive lung disease/ hx R lobectomy/ Carcinoid tumor, complicated by hx breast Ca She met with the Sleep Center staff, with recommendation that she have a new titration for pressure adjustment. She is willing "because I want some rest". New full face mask fits better.   Review of Systems Constitutional:   No weight loss, night sweats,  Fevers, chills, fatigue, lassitude. HEENT:   No headaches,  Difficulty swallowing,  Tooth/dental problems,  Sore throat,                No sneezing, itching, ear ache, nasal congestion, post nasal drip,   CV:  No chest pain,  Orthopnea, PND, swelling in lower extremities, anasarca, dizziness, palpitations  GI  No heartburn, indigestion, abdominal pain, nausea, vomiting, diarrhea, change in bowel habits, loss of appetite  Resp: Shortness of breath with exertion or at rest.   Non-productive cough,  No coughing up of blood.  No change in color of mucus.  No wheezing.   Skin: no rash or lesions.  GU: no dysuria, change in color of urine, no urgency or frequency.  No flank pain.  MS:  No joint pain or swelling.   No decreased range of motion.  No back pain.  Psych:  No change in mood or affect. No depression or anxiety.  No memory loss.      Objective:   Physical Exam General- Alert, Oriented, Affect-appropriate, Distress- none acute  Obese, wig Skin- rash-none, lesions- none, excoriation- none Lymphadenopathy- none Head- atraumatic Eyes- Gross vision intact, PERRLA, conjunctivae clear secretions Ears- Hearing, canals, Tm normal Nose- Clear, No- Septal dev, mucus, polyps, erosion, perforation   Scar bridge of nose Throat- Mallampati II , mucosa clear , drainage- none, tonsils- atrophic Neck- flexible , trachea midline, no stridor , thyroid nl, carotid no bruit Chest - symmetrical excursion , unlabored    Right pacemaker Heart/CV- RRR , 1/6 systolic murmur right upper sternal area , no gallop  , no rub, nl s1  - JVD- none , edema- none, stasis changes- none, varices- none  Lung- clear to P&A, wheeze- none,  , dullness-none, rub- none                      Harsh cough Chest wall- atraumatic, no scar Abd- tender-no, distended-no, bowel sounds-present, HSM- no Br/ Gen/ Rectal- Not done, not indicated Extrem- cyanosis- none, clubbing, none, atrophy- none, strength- nl Neuro- grossly intact to observation         Assessment & Plan:     Review of Systems  Objective:   Physical Exam        Assessment & Plan:

## 2010-11-12 NOTE — Assessment & Plan Note (Addendum)
She is happier with current mask. We will follow assessment of sleep center staff and re titrate CPAP now for best pressure.  She wakes fitfully and may still benefit from use of a sleep aid at times.

## 2010-11-13 ENCOUNTER — Other Ambulatory Visit: Payer: Self-pay | Admitting: Internal Medicine

## 2010-11-15 DIAGNOSIS — G4710 Hypersomnia, unspecified: Secondary | ICD-10-CM

## 2010-11-15 DIAGNOSIS — G4730 Sleep apnea, unspecified: Secondary | ICD-10-CM

## 2010-11-15 DIAGNOSIS — R0989 Other specified symptoms and signs involving the circulatory and respiratory systems: Secondary | ICD-10-CM

## 2010-11-15 DIAGNOSIS — R0609 Other forms of dyspnea: Secondary | ICD-10-CM

## 2010-11-15 NOTE — Procedures (Signed)
NAMELARENA, OHNEMUS               ACCOUNT NO.:  [id number]  MEDICAL RECORD NO.:  [id number]          PATIENT TYPE:  OUT  LOCATION:  SLEEP CENTER                 FACILITY:  [facility]  PHYSICIAN:  Aliviya Schoeller D. Maple Hudson, MD, FCCP, FACPDATE OF BIRTH:  Dec 29[birth year] 1947  DATE OF STUDY:  11/12/2010                           NOCTURNAL POLYSOMNOGRAM  REFERRING PHYSICIAN:  Drae Mitzel D Calloway Andrus  INDICATION FOR STUDY:  Hypersomnia with sleep apnea.  EPWORTH SLEEPINESS SCORE:  Epworth sleepiness score 1/24, BMI 47.3. Weight 280 pounds, height 64.5 inches.  Neck 16 inches.  Home medications are charted and reviewed.  A baseline diagnostic study on July 27, 2005 had recorded an HPI of 153.2 per hour..  Weight was recorded of 315 pounds.  On a CPAP titration study once on May 01, 2009, CPAP was titrated to 13 for an HPI of 1.8 per hour.  She weighed 273 pounds.  CPAP titration is now requested.  MEDICATIONS:  SLEEP ARCHITECTURE:  Total sleep time 276 minutes with sleep efficiency 70.7%.  Stage I 12.3%, stage II 79.2%, stage III absent, REM 8.5% of total sleep time.  Sleep latency 38 minutes, REM latency 75.5 minutes, awake after sleep onset 77.5 minutes, arousal index 30.2.  Bedtime medication:  None.  RESPIRATORY DATA:  CPAP titration protocol.  CPAP was titrated to 11 CWP, AHI 0.7 per hour.  She wore a medium Respironics Amara.full face mask with heated humidifier and supplemental oxygen.  OXYGEN DATA:  Snoring was prevented by CPAP.  Oxygen saturation nadir of 83% with a total recording time oxygen saturation less than 88% of 73.2 minutes.  Supplemental oxygen was added at 1 liter per minute.  Mean oxygen saturation through the study was 89.7% on room air.  CARDIAC DATA:  Sinus rhythm with PACs and PVCs.  The patient has a pacemaker.  MOVEMENT-PARASOMNIA:  No significant movement disturbance.  Bathroom x1.  IMPRESSIONS-RECOMMENDATIONS: 1. Successful CPAP titration to 11 CWP, AHI 0.7 per hour.  She  wore a     medium Respironics Amara full face mask with heated humidifier and     supplemental oxygen at 1 liter per minute. 2. Snoring was prevented, but mean oxygen saturation remained low at     89.7% on 1 liter supplemental oxygen through the study.  A total of     73.2 minutes was recorded with room air oxygen saturation less than     88% during sleep.  Baseline room air oxygen saturation on arrival     was 92%.  Supplemental oxygen should be considered during sleep in     the home. 3. Original baseline diagnostic NPSG on July 27, 2005 recorded AHI     153.2 per hour.  Weight on that study was 315 pounds and is for the     current study 280 pounds.     Isidro Monks D. Maple Hudson, MD, [facility], FACP Diplomate, Biomedical engineer of Sleep Medicine Electronically Signed    CDY/MEDQ  D:  11/15/2010 09:32:12  T:  11/15/2010 16:43:19  Job:  [id number]

## 2010-12-03 ENCOUNTER — Ambulatory Visit (INDEPENDENT_AMBULATORY_CARE_PROVIDER_SITE_OTHER): Payer: Medicare Other | Admitting: Dietician

## 2010-12-03 ENCOUNTER — Telehealth: Payer: Self-pay | Admitting: Dietician

## 2010-12-03 DIAGNOSIS — E119 Type 2 diabetes mellitus without complications: Secondary | ICD-10-CM

## 2010-12-03 NOTE — Telephone Encounter (Signed)
Correction Rx for pen needles needs to go to [facility] [phone]) rather than [facility] as patient has changed mail order companies. CDE called [facility]:

## 2010-12-03 NOTE — Progress Notes (Signed)
Medical Nutrition Therapy:  Appt start time: 1000 end time:  1100.  Assessment:  Primary concerns today: Weight management and Blood sugar control. Reports she is eating 2 cups of vegetables a day, but no vegetable in 24 hour diet recall. Eating habits generally sporadic and unplanned.  Little activity due to poor sleep. Working with doctor to improve this. Eats at bedtime to take third course of pills and when she does eat at bedtime she notes her fasting blood glucose is higher the next morning.  Takes Byetta and then skips meal, then eats later when Byetta not active for the past month. Weigh decreased by 5 # in one month. Byetta helping with appetite.      Usual physical activity includes walks at [employer] a few times a week in tennis shoes    Progress Towards Goal(s):  In progress   Nutritional Diagnosis:   NB-1.1 Food and nutrition-related knowledge deficit As related to improving vegetable intake improving As evidenced by patient report of eating 2 cups vegetables per day  ~ 50% of the time.  NB 1.1 Food and nutrition related knowledge deficit as related to not knowing food sources of sodium, carbs and fat as evidenced by patient beginning to make healthier food choices.  Interventions: 1- review of meter download: Improved control of blood sugars on Byetta 10 mcg twice daily and lantus 8 units daily. Lowest 106 and highest value 289 with pattern of increasing blood sugars during daytime fasting average increased from 135 to 159 and no change in bedtime average of ~ 165 mg/dl. Overall average is 178.8.  2- Goal setting: always carry food with her, suggestions for snacks and food that travels well provided. 3- Coordination of care with physician- request RX for pen needles and consider decrease pill burden to twice daily. 4- Reflective listening used to assist patient in setting goals  5- Education regarding Byetta action and appropriate timing with food    Monitoring/Evaluation:  Dietary  intake and blood sugars in 4 week(s)

## 2010-12-03 NOTE — Patient Instructions (Signed)
You are doing great and have lost 5 POUNDS in the last month!  Keep working it!  Try to carry food/healthy snacks with you so when you cannot get a meal you can eat on time.   Tuna package Canned fruit Snack pack puddings Unsalted nut almonds, wlanuts Make your own peanut butter crackers and take with you  Will discuss with Dr. Meredith Pel about only having to take pills twice daily

## 2010-12-03 NOTE — Telephone Encounter (Signed)
Note 10/28/10 Rx order per Dr. Meredith Pel for diabetes supplies indicates one insulin injection per day. Patient needs 3 pen needles per day 2 for byetta dn 1 for insulin. [facility] requests we send in a separate prescription for Byetta pen needles for twice a day injections.

## 2010-12-04 NOTE — Progress Notes (Signed)
I initialed the addition to the form for pen needles.  Regarding the metformin, dosing guidelines advise that doses above 2,000 mg per day be divided into TID dosing, so I would not advise changing the metformin dosing.

## 2010-12-07 NOTE — Telephone Encounter (Signed)
Corrected order faxed on Friday 8.17.12

## 2010-12-14 ENCOUNTER — Other Ambulatory Visit: Payer: Self-pay | Admitting: *Deleted

## 2010-12-14 DIAGNOSIS — M25519 Pain in unspecified shoulder: Secondary | ICD-10-CM

## 2010-12-14 DIAGNOSIS — M79606 Pain in leg, unspecified: Secondary | ICD-10-CM

## 2010-12-14 NOTE — Telephone Encounter (Signed)
Last filled 11/13/10

## 2010-12-15 ENCOUNTER — Encounter: Payer: Self-pay | Admitting: Internal Medicine

## 2010-12-15 ENCOUNTER — Ambulatory Visit (INDEPENDENT_AMBULATORY_CARE_PROVIDER_SITE_OTHER): Payer: Medicare Other | Admitting: Internal Medicine

## 2010-12-15 VITALS — BP 144/70 | HR 93 | Ht 64.5 in | Wt 279.8 lb

## 2010-12-15 DIAGNOSIS — J449 Chronic obstructive pulmonary disease, unspecified: Secondary | ICD-10-CM

## 2010-12-15 DIAGNOSIS — G4733 Obstructive sleep apnea (adult) (pediatric): Secondary | ICD-10-CM

## 2010-12-15 MED ORDER — TEMAZEPAM 15 MG PO CAPS: 15.0000 mg | ORAL_CAPSULE | Freq: Every evening | ORAL | Status: AC | PRN

## 2010-12-15 MED ORDER — HYDROCODONE-ACETAMINOPHEN 5-325 MG PO TABS: 1.0000 | ORAL_TABLET | Freq: Four times a day (QID) | ORAL | Status: DC | PRN

## 2010-12-15 NOTE — Telephone Encounter (Signed)
Refill approved - nurse to complete. 

## 2010-12-15 NOTE — Patient Instructions (Addendum)
Order- [facility]- [facility]- Change CPAP to 11  Dx OSA                                                 - add home O2 for sleep 2 L/M through CPAP for dx COPD, based on desaturation < 88% for 73 minutes during sleep study                                                                             on 11/12/10  Script for temazepam for sleep if needed- only while wearing CPAP

## 2010-12-15 NOTE — Progress Notes (Signed)
Subjective:    Patient ID: Melissa Villa, female    DOB: [birth year], 65 y.o.   MRN: [id number]  HPI    Review of Systems     Objective:   Physical Exam        Assessment & Plan:   Subjective:    Patient ID: Melissa Villa, female    DOB: 03-29-1946, 65 y.o.   MRN: [id number]  HPI  Subjective:    Patient ID: Melissa Villa, female    DOB: 14-Oct-1945, 65 y.o.   MRN: [id number]  HPI 10/12/10- 38 yoF former smoker seen at kind request of Dr Meredith Pel for sleep evaluation. She complains of difficulty maintaining sleep, waking 4-5 x/ night. Has had 2 sleep studies positive for OSA. Sleeps every night with CPAP, but can't maintain sleep and fights daytime somnolence.  CPAP keeps her awake, but cna't fall asleep without it. The mask cuts her nose and leaks. Has tried Ambien, Rozerem and others. Lives alone and unsure about snoring or leg movement.  Has had a significant cough with a cold but feeling better. R lobectomy 2003 for ? Cancer vs benign nodule. Lumpectomy breast for cancer.  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 She is willing "because I want some rest". New full face mask fits better.   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 Key was that her O2 sat wouldn't stay up even on 1 L/M, recording 73 minutes less than 88%. I explained this may be why she feels unable to sleep well.  She had restrictive lung disease on pulmonary function testing 08/05/2010 which may reflect her lobectomy as well as radiation therapy for her breast cancer and her obesity.  Review of Systems Constitutional:   No weight loss, night sweats,  Fevers, chills,  fatigue, lassitude. HEENT:   No headaches,  Difficulty swallowing,  Tooth/dental problems,  Sore throat,                No sneezing, itching, ear ache, nasal congestion, post nasal drip,  CV:  No chest pain,  Orthopnea, PND, swelling in lower extremities, anasarca, dizziness, palpitations GI  No heartburn, indigestion, abdominal pain, nausea, vomiting, diarrhea, change in bowel habits, loss of appetite Resp: Shortness of breath with exertion or at rest.   Non-productive cough,  No coughing up of blood.  No change in color of mucus.  No wheezing.  Skin: no rash or lesions. GU: no dysuria, change in color of urine, no urgency or frequency.  No flank pain. MS:  No joint pain or swelling.  No decreased range of motion.  No back pain. Psych:  No change in mood or affect. No depression or anxiety.  No memory loss.    Objective:   Physical Exam General- Alert, Oriented, Affect-appropriate, Distress- none acute   Very Obese, wig Skin- rash-none, lesions- none, excoriation- none Lymphadenopathy- none Head- atraumatic Eyes- Gross vision intact, PERRLA, conjunctivae clear secretions Ears- Hearing, canals, Tm normal Nose- Clear, No- Septal dev, mucus, polyps, erosion, perforation   Scar bridge of nose Throat- Mallampati II , mucosa clear , drainage- none, tonsils- atrophic Neck- flexible , trachea midline, no stridor , thyroid nl, carotid  no bruit Chest - symmetrical excursion , unlabored    Right pacemaker Heart/CV- RRR , 1/6 systolic murmur right upper sternal area , no gallop  , no rub, nl s1  - JVD- none , edema- none, stasis changes- none, varices- none  Lung- clear to P&A, wheeze- none,  , dullness-none, rub- none                      Harsh cough Chest wall-  Abd- tender-no, distended-no, bowel sounds-present, HSM- no Br/ Gen/ Rectal- Not done, not indicated Extrem- cyanosis- none, clubbing, none, atrophy- none, strength- nl Neuro- grossly intact to observation         Assessment &  Plan:     Review of Systems     Objective:   Physical Exam        Assessment & Plan:

## 2010-12-15 NOTE — Assessment & Plan Note (Signed)
Body habitus favors OHS as an improtant contribution to exertional dyspnea and night time desaturation. We can add O2 for slee3p

## 2010-12-15 NOTE — Assessment & Plan Note (Signed)
We will change CPAP to 11 per recent titration, and add O2 and a sleep med

## 2010-12-16 ENCOUNTER — Ambulatory Visit (INDEPENDENT_AMBULATORY_CARE_PROVIDER_SITE_OTHER): Payer: Medicare Other | Admitting: Internal Medicine

## 2010-12-16 ENCOUNTER — Encounter: Payer: Self-pay | Admitting: Internal Medicine

## 2010-12-16 VITALS — BP 153/77 | HR 91 | Temp 98.6°F | Wt 277.3 lb

## 2010-12-16 DIAGNOSIS — G4733 Obstructive sleep apnea (adult) (pediatric): Secondary | ICD-10-CM

## 2010-12-16 DIAGNOSIS — E7800 Pure hypercholesterolemia, unspecified: Secondary | ICD-10-CM

## 2010-12-16 DIAGNOSIS — I10 Essential (primary) hypertension: Secondary | ICD-10-CM

## 2010-12-16 DIAGNOSIS — E119 Type 2 diabetes mellitus without complications: Secondary | ICD-10-CM

## 2010-12-16 LAB — BASIC METABOLIC PANEL
BUN: 13 mg/dL (ref 6–23)
Calcium: 9.7 mg/dL (ref 8.4–10.5)
Chloride: 104 mEq/L (ref 96–112)
Creat: 0.95 mg/dL (ref 0.50–1.10)

## 2010-12-16 MED ORDER — INSULIN GLARGINE 100 UNIT/ML ~~LOC~~ SOLN: 12.0000 [IU] | Freq: Every day | SUBCUTANEOUS | Status: DC

## 2010-12-16 NOTE — Assessment & Plan Note (Addendum)
Lipids:    Component Value Date/Time   CHOL 155 10/08/2010 0849   TRIG 114 10/08/2010 0849   HDL 33* 10/08/2010 0849   LDLCALC 99 10/08/2010 0849   VLDL 23 10/08/2010 0849   CHOLHDL 4.7 10/08/2010 0849    Patient's LDL is at goal on her current dose of Crestor, and she reports no problems with that medication.  The plan is to continue at current dose.

## 2010-12-16 NOTE — Assessment & Plan Note (Signed)
Patient is followed by Dr. Maple Hudson, who recently added nocturnal oxygen to her CPAP.  I am hopeful that this will improve her symptoms of daytime fatigability.

## 2010-12-16 NOTE — Assessment & Plan Note (Signed)
Lab Results  Component Value Date   HGBA1C 7.1 11/03/2010   HGBA1C 7.1 03/30/2010   CREATININE 0.86 10/08/2010   CREATININE 1.24* 06/24/2010   MICROALBUR 16.22* 12/03/2009   MICRALBCREAT 423.5* 12/03/2009   CHOL 155 10/08/2010   HDL 33* 10/08/2010   TRIG 114 10/08/2010    Assessment: Diabetes control: not controlled Progress toward goals: improved Barriers to meeting goals: no barriers identified  Plan: Diabetes treatment: The plan is to increase Lantus insulin to a dose of 12 units daily, and continue Byetta and metformin at current dose.  I advised patient to continue monitoring her blood sugars and to bring in her meter when she sees our diabetes educator Jamison Neighbor in September. Refer to: none Instruction/counseling given: reminded to bring medications to each visit

## 2010-12-16 NOTE — Assessment & Plan Note (Signed)
Lab Results  Component Value Date   NA 142 10/08/2010   K 4.1 10/08/2010   CL 107 10/08/2010   CO2 24 10/08/2010   BUN 17 10/08/2010   CREATININE 0.86 10/08/2010   CREATININE 1.24* 06/24/2010    BP Readings from Last 3 Encounters:  12/16/10 153/77  12/15/10 144/70  11/12/10 130/70    Assessment: Hypertension control:  mildly elevated  Progress toward goals:  unchanged Barriers to meeting goals:  no barriers identified  Plan: Hypertension treatment:  continue current medications, and will reassess after she has been on nocturnal oxygen.

## 2010-12-16 NOTE — Progress Notes (Signed)
  Subjective:    Patient ID: Melissa Villa, female    DOB: 1945-09-30, 65 y.o.   MRN: [id number]  HPI Patient returns for followup of her diabetes mellitus, obstructive sleep apnea, COPD, hypertension, and other chronic medical problems.  She reports continued daytime fatigability, and yesterday she saw Dr. Maple Hudson at the pulmonary office for followup of her obstructive sleep apnea; Dr. Maple Hudson started nocturnal oxygen as a supplement to her CPAP.  Patient's blood sugars have been running a little high, with values in the mid to high 100s and 200s, and no low values.  She reports that she is compliant with her medications.   Review of Systems  Cardiovascular: Positive for leg swelling (some recent leg swelling, improved today).       Objective:   Physical Exam  Cardiovascular: Normal rate, regular rhythm and normal heart sounds.  Exam reveals no gallop and no friction rub.   No murmur heard. Pulmonary/Chest: Effort normal and breath sounds normal. No respiratory distress. She has no wheezes. She has no rales.  Abdominal: Soft. Bowel sounds are normal. She exhibits no distension. There is no tenderness. There is no rebound and no guarding.  Musculoskeletal: She exhibits no edema.          Assessment & Plan:

## 2010-12-16 NOTE — Telephone Encounter (Signed)
Rx faxed in.

## 2010-12-16 NOTE — Patient Instructions (Signed)
Increase Lantus insulin to 12 units daily.

## 2010-12-20 ENCOUNTER — Encounter: Payer: Self-pay | Admitting: Internal Medicine

## 2010-12-28 ENCOUNTER — Encounter: Payer: Self-pay | Admitting: Internal Medicine

## 2010-12-30 ENCOUNTER — Telehealth: Payer: Self-pay | Admitting: *Deleted

## 2010-12-30 DIAGNOSIS — J449 Chronic obstructive pulmonary disease, unspecified: Secondary | ICD-10-CM

## 2010-12-30 NOTE — Telephone Encounter (Signed)
Message copied by Ronny Bacon on Wed Dec 30, 2010  4:25 PM ------      Message from: [place], [place] D      Created: Sun Dec 20, 2010 11:40 AM       Please order Advanced ONOX on room air on CPAP, to document for home O2 order.

## 2010-12-30 NOTE — Telephone Encounter (Signed)
Order placed to [facility]. 

## 2011-01-06 ENCOUNTER — Ambulatory Visit (INDEPENDENT_AMBULATORY_CARE_PROVIDER_SITE_OTHER): Payer: Medicare Other | Admitting: Dietician

## 2011-01-06 ENCOUNTER — Ambulatory Visit (INDEPENDENT_AMBULATORY_CARE_PROVIDER_SITE_OTHER): Payer: Medicare Other | Admitting: *Deleted

## 2011-01-06 DIAGNOSIS — E119 Type 2 diabetes mellitus without complications: Secondary | ICD-10-CM

## 2011-01-06 DIAGNOSIS — Z23 Encounter for immunization: Secondary | ICD-10-CM

## 2011-01-13 NOTE — Progress Notes (Signed)
Medical Nutrition Therapy:  Appt start time: 1330 end time:  1345.  Assessment:  Primary concerns today: Weight management and Blood sugar control. Reports she is feeling burned out on diabetes care. Seems down in general, blood sugars not improved as she had hoped. Says she may be depressed but does not want antidepressants.  Discussed support groups.  Eating habits generally sporadic and unplanned.  Sleep is better.  Weight without significant change, likely die to increased blood sugars and Lantus dose. Byetta reported to continue to help with appetite.      Usual physical activity includes walks occasionally at [employer] and watches grandchildren.    Progress Towards Goal(s):  In progress   Nutritional Diagnosis:   NB-1.1 Food and nutrition-related knowledge deficit as related to improving vegetable intake still about without change .  NB 1.1 Food and nutrition related knowledge deficit as related to not knowing food sources of sodium, carbs and fat without change as patient seems distracted.  NB Not ready for lifestyle Interventions: 1- Review of meter download:Patient increased lantus to 20 units to improve controll of blood sugars on Byetta 10 mcg twice daily and metformin 850 mg twice daily. Lowest 124 and highest value 305 with pattern of slightly increasing blood sugars during daytime. Average is  202 for 82 readings over 28 days.  2- Goal setting: consider support groups.always carry food with her, suggestions for snacks and food that travels well provided. 3- Coordination of care with physician- patient requesting consideration of new diet pills for weight loss*. Could also consider Janumet. Consider more definitive screen for depression 4- Reflective listening and social support used to assist patient in identifying  Barriers to self care and setting goals      Monitoring/Evaluation:  FH 4.2.7. Patient not showing readiness to change nutrition related behaviors at this time. Will  reassess Dietary intake and blood sugars in 12 week(s)

## 2011-01-15 ENCOUNTER — Telehealth: Payer: Self-pay | Admitting: Dietician

## 2011-01-15 ENCOUNTER — Encounter: Payer: Self-pay | Admitting: Internal Medicine

## 2011-01-15 NOTE — Telephone Encounter (Signed)
wants to know if I forgot to ask Dr. Meredith Pel if she can go back on januvia vs increase lantus to improve blood sugars and about diet pill.   Called patient and told her you are out until Tuesday [name] would get back to her them.   Also offered Medical Nutrition therapy for weight loss using meal replacements that has good evidence behind it. She is not interested.

## 2011-01-18 ENCOUNTER — Other Ambulatory Visit: Payer: Self-pay | Admitting: Internal Medicine

## 2011-01-18 NOTE — Telephone Encounter (Signed)
Can she bring her meter in so we can download and see her recent blood glucose measurements?

## 2011-01-19 LAB — GLUCOSE, CAPILLARY: Glucose-Capillary: 62 — ABNORMAL LOW

## 2011-01-19 NOTE — Telephone Encounter (Signed)
Yes, and also confirm her current dose of Lantus.

## 2011-01-19 NOTE — Telephone Encounter (Signed)
Patient will bring meter by tomorrow to be downloaded.

## 2011-01-19 NOTE — Telephone Encounter (Signed)
I can print you the download through the 19th of September. Would you like more recent information?

## 2011-01-19 NOTE — Telephone Encounter (Signed)
Called patient to confirm dose of lantus and request for her meter to be broughtin. Left message.

## 2011-01-20 ENCOUNTER — Telehealth: Payer: Self-pay | Admitting: Dietician

## 2011-01-20 DIAGNOSIS — E119 Type 2 diabetes mellitus without complications: Secondary | ICD-10-CM

## 2011-01-20 NOTE — Telephone Encounter (Signed)
Patient brought meter by: average for past month is 192.9mg /dl, range 914-782, 95% between 70-160 and 76% > 160, pattern with consistent elevation throughout day between 130-250 with average ~180 mg/dl, except in Pm between 621 Pm and 930 Pm when average of 7 blood sugars is 239 with a low ~ 180 and highs ~ 300. Will put download in your box.  Has been taking lantus 20 units daily.

## 2011-01-22 NOTE — Telephone Encounter (Signed)
I reviewed her blood glucose record, and would advise increasing Lantus from 20 to 24 units daily. I tried calling her today, but did not get an answer.  We can try again on Monday.

## 2011-01-25 NOTE — Telephone Encounter (Signed)
Called patient gave new lantus order, she verbalized understanding of new dose of 24 units to start tonight.

## 2011-01-29 LAB — DIFFERENTIAL
Basophils Absolute: 0
Basophils Relative: 0
Neutro Abs: 3.5
Neutrophils Relative %: 65

## 2011-01-29 LAB — CBC
MCHC: 31.8
Platelets: 255
RBC: 4.72
RDW: 19.5 — ABNORMAL HIGH

## 2011-01-29 LAB — COMPREHENSIVE METABOLIC PANEL
Alkaline Phosphatase: 69
BUN: 10
CO2: 27
GFR calc Af Amer: >60
GFR calc non Af Amer: >60
Glucose, Bld: 144 — ABNORMAL HIGH
Sodium: 140
Total Bilirubin: 0.6

## 2011-01-29 LAB — CARDIAC PANEL(CRET KIN+CKTOT+MB+TROPI)
CK, MB: 1.3
Relative Index: INVALID
Total CK: 83
Troponin I: 0.01

## 2011-02-02 LAB — TSH: TSH: 1.124

## 2011-02-02 LAB — BASIC METABOLIC PANEL
BUN: 22
CO2: 27
CO2: 29
Calcium: 8.7
Calcium: 8.9
Chloride: 104
Chloride: 105
Creatinine, Ser: 0.71
Creatinine, Ser: 0.97
GFR calc Af Amer: >60
GFR calc Af Amer: >60
GFR calc Af Amer: >60
GFR calc non Af Amer: 59 — ABNORMAL LOW
GFR calc non Af Amer: >60
Glucose, Bld: 166 — ABNORMAL HIGH
Potassium: 4
Sodium: 137
Sodium: 140

## 2011-02-02 LAB — DIFFERENTIAL
Lymphs Abs: 1.1
Monocytes Relative: 11
Neutro Abs: 6.3
Neutrophils Relative %: 75

## 2011-02-02 LAB — RAPID URINE DRUG SCREEN, HOSP PERFORMED
Barbiturates: NOT DETECTED
Benzodiazepines: NOT DETECTED
Cocaine: NOT DETECTED
Opiates: POSITIVE — AB

## 2011-02-02 LAB — CBC
HCT: 36.6
Hemoglobin: 11.4 — ABNORMAL LOW
Hemoglobin: 11.5 — ABNORMAL LOW
MCHC: 31.5
MCHC: 31.9
MCV: 85.3
MCV: 85.4
MCV: 85.9
RBC: 4.2
RBC: 4.24
RBC: 4.26
RDW: 19.9 — ABNORMAL HIGH
WBC: 8.4

## 2011-02-02 LAB — BLOOD GAS, ARTERIAL
Acid-Base Excess: 0.3
Bicarbonate: 24
Bicarbonate: 24.4 — ABNORMAL HIGH
FIO2: 0.21
O2 Saturation: 90.1
O2 Saturation: 92.8
Patient temperature: 98.6
TCO2: 25.5
pCO2 arterial: 36.2
pO2, Arterial: 57.5 — ABNORMAL LOW
pO2, Arterial: 66.9 — ABNORMAL LOW

## 2011-02-02 LAB — CULTURE, BLOOD (ROUTINE X 2): Culture: NO GROWTH

## 2011-02-02 LAB — CARDIAC PANEL(CRET KIN+CKTOT+MB+TROPI)
CK, MB: 0.9
Relative Index: 0.6
Relative Index: 0.7
Relative Index: 1.1
Total CK: 121
Troponin I: 0.01
Troponin I: 0.02

## 2011-02-02 LAB — LIPID PANEL
LDL Cholesterol: 60
Total CHOL/HDL Ratio: 4.9
VLDL: 21

## 2011-02-02 LAB — B-NATRIURETIC PEPTIDE (CONVERTED LAB): Pro B Natriuretic peptide (BNP): 62

## 2011-02-03 ENCOUNTER — Ambulatory Visit (INDEPENDENT_AMBULATORY_CARE_PROVIDER_SITE_OTHER): Payer: Medicare Other | Admitting: Internal Medicine

## 2011-02-03 ENCOUNTER — Encounter: Payer: Self-pay | Admitting: Internal Medicine

## 2011-02-03 VITALS — BP 160/78 | HR 92 | Ht 64.0 in | Wt 281.0 lb

## 2011-02-03 DIAGNOSIS — J4489 Other specified chronic obstructive pulmonary disease: Secondary | ICD-10-CM

## 2011-02-03 DIAGNOSIS — J449 Chronic obstructive pulmonary disease, unspecified: Secondary | ICD-10-CM

## 2011-02-03 DIAGNOSIS — G4733 Obstructive sleep apnea (adult) (pediatric): Secondary | ICD-10-CM

## 2011-02-03 DIAGNOSIS — R0989 Other specified symptoms and signs involving the circulatory and respiratory systems: Secondary | ICD-10-CM

## 2011-02-03 DIAGNOSIS — R0609 Other forms of dyspnea: Secondary | ICD-10-CM

## 2011-02-03 MED ORDER — CLONAZEPAM 0.5 MG PO TABS: ORAL_TABLET | ORAL | Status: DC

## 2011-02-03 NOTE — Patient Instructions (Signed)
Script clonazepam-  Try instead of temazepam to help sleep.   Continue CPAP at 11 with Oxygen at 2 L

## 2011-02-03 NOTE — Assessment & Plan Note (Addendum)
Good compliance and control with CPAP now set at 11, with supplemental oxygen at 2 L We will try to improve sleep consolidation by changing temazepam to clonazepam as discussed. We have also reviewed sleep hygiene.

## 2011-02-03 NOTE — Progress Notes (Addendum)
Patient ID: Melissa Villa, female    DOB: 1945/07/02, 65 y.o.   MRN: [id number]  HPI 10/12/10- 24 yoF former smoker seen at kind request of Dr Meredith Pel for sleep evaluation. She complains of difficulty maintaining sleep, waking 4-5 x/ night. Has had 2 sleep studies positive for OSA. Sleeps every night with CPAP, but can't maintain sleep and fights daytime somnolence.  CPAP keeps her awake, but cna't fall asleep without it. The mask cuts her nose and leaks. Has tried Ambien, Rozerem and others. Lives alone and unsure about snoring or leg movement.  Has had a significant cough with a cold but feeling better. R lobectomy 2003 for ? Cancer vs benign nodule. Lumpectomy breast for cancer.  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 She is willing "because I want some rest". New full face mask fits better.   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 Key was that her O2 sat wouldn't stay up even on 1 L/M, recording 73 minutes less than 88%. I explained this may be why she feels unable to sleep well.  She had restrictive lung disease on pulmonary function testing 08/05/2010 which may reflect her lobectomy as well as radiation therapy for her breast cancer and her obesity.  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 Insomnia remains a problem, she doesn't sleep soundly through the night and is sleepy without naps in the daytime. Temazepam 15 mg is not enough. He takes 2 hours to fall asleep and then if she  wakes to the bathroom she again needs a long time before she can return to sleep.   Review of Systems Constitutional:   No weight loss, night sweats,  Fevers, chills, fatigue, lassitude. HEENT:   No headaches,  Difficulty swallowing,  Tooth/dental problems,  Sore throat,                No sneezing, itching, ear ache, nasal congestion, post nasal drip,  CV:  No chest pain,  Orthopnea, PND, swelling in lower extremities, anasarca, dizziness, palpitations GI  No heartburn, indigestion, abdominal pain, nausea, vomiting, diarrhea, change in bowel habits, loss of appetite Resp: +Shortness of breath with exertion, not at rest.   Non-productive cough,  No coughing up of blood.  No change in color of mucus.  No wheezing.  Skin: no rash or lesions. GU: no dysuria, change in color of urine, no urgency or frequency.  No flank pain. MS:  No joint pain or swelling.  No decreased range of motion.  No back pain. Psych:  No change in mood or affect. No depression or anxiety.  No memory loss.    Objective:   Physical Exam General- Alert, Oriented, Affect-appropriate, Distress- none acute, obese    Skin- rash-none, lesions- none, excoriation- none Lymphadenopathy- none Head- atraumatic Eyes- Gross vision intact, PERRLA, conjunctivae clear secretions Ears- Hearing, canals, Tm normal Nose- Clear, No- Septal dev, mucus, polyps, erosion, perforation   Scar bridge of nose Throat- Mallampati II , mucosa clear , drainage-  none, tonsils- atrophic Neck- flexible , trachea midline, no stridor , thyroid nl, carotid no bruit Chest - symmetrical excursion , unlabored    Right pacemaker Heart/CV- RRR , 1/6 systolic murmur right upper sternal area , no gallop  , no rub, nl s1  - JVD- none , edema- none, stasis changes- none, varices- none  Lung- clear to P&A, wheeze- none,  , dullness-none, rub- none    Chest wall-  Abd- tender-no, distended-no, bowel sounds-present, HSM- no Br/ Gen/ Rectal- Not done, not  indicated Extrem- cyanosis- none, clubbing, none, atrophy- none, strength- nl.  negative Homans. Neuro- grossly intact to observation

## 2011-02-04 ENCOUNTER — Encounter: Payer: Medicare Other | Admitting: *Deleted

## 2011-02-04 ENCOUNTER — Encounter: Payer: Self-pay | Admitting: Internal Medicine

## 2011-02-04 ENCOUNTER — Ambulatory Visit (INDEPENDENT_AMBULATORY_CARE_PROVIDER_SITE_OTHER): Payer: Medicare Other | Admitting: *Deleted

## 2011-02-04 DIAGNOSIS — I442 Atrioventricular block, complete: Secondary | ICD-10-CM

## 2011-02-04 LAB — PACEMAKER DEVICE OBSERVATION
AL AMPLITUDE: 3.3 mv
AL IMPEDENCE PM: 525 Ohm
BAMS-0003: 60 {beats}/min
BATTERY VOLTAGE: 2.9629 V
RV LEAD AMPLITUDE: 12 mv

## 2011-02-04 NOTE — Progress Notes (Signed)
PPM check 

## 2011-02-05 NOTE — Assessment & Plan Note (Signed)
Her primary problem is significant obesity with obesity hypoventilation syndrome and deconditioning. There seems to be a modest reversible component. She finds Advair too expensive. We're going to let her use a rescue inhaler twice daily and as needed. I have discussed endurance exercise

## 2011-02-06 NOTE — Assessment & Plan Note (Signed)
I think we are seeing next defects of obesity hypoventilation and COPD. We may need to get a 6 minute walk test. 2 L of oxygen is appropriate now during sleep. Weight loss would help everything but is probably not realistic. Counseling was done

## 2011-02-15 ENCOUNTER — Other Ambulatory Visit: Payer: Self-pay | Admitting: Internal Medicine

## 2011-03-15 ENCOUNTER — Other Ambulatory Visit: Payer: Self-pay | Admitting: Internal Medicine

## 2011-03-16 NOTE — Telephone Encounter (Signed)
Aggrenox rx faxed to [facility]; e-prescribe was down yesterday.

## 2011-03-17 ENCOUNTER — Other Ambulatory Visit: Payer: Self-pay | Admitting: Internal Medicine

## 2011-03-22 ENCOUNTER — Other Ambulatory Visit: Payer: Self-pay | Admitting: Internal Medicine

## 2011-03-24 ENCOUNTER — Telehealth: Payer: Self-pay | Admitting: Internal Medicine

## 2011-03-24 MED ORDER — PREDNISONE 10 MG PO TABS: ORAL_TABLET | ORAL | Status: DC

## 2011-03-24 MED ORDER — VALACYCLOVIR HCL 500 MG PO TABS: 500.0000 mg | ORAL_TABLET | Freq: Three times a day (TID) | ORAL | Status: DC

## 2011-03-24 NOTE — Telephone Encounter (Signed)
Pt c/o pain on her left side for 2 weeks. Pt says that she first noticed a blistery rash on Thurs., 11/29 that runs around the right side from her abdomen to her back and is very painful. She also c/o a dry cough with increased sob and wheezing and has been taking Coricidin HBP. She states she has been trying to make an appt with her PCP since Oct 2012 but he is in [place]. Pls advise.No Known Allergies

## 2011-03-24 NOTE — Telephone Encounter (Signed)
Per CY-Please have patient come in to see CY tomorrow at 2pm to check on the rash but in the meantime send Valacyclovir 500mg  #42 take 2 tid x 7days no refills and give Prednisone 10 mg #20 take 4 x 2 days, 3 x 2 days, 2 x 2 days, 1 x 2 days, then stop no refills.

## 2011-03-24 NOTE — Telephone Encounter (Signed)
Spoke with pt and notified of recs per CDY. Pt verbalized understanding, and denied any questions. Appt sched for 2 pm tomorrow and also meds were sent to pharm.

## 2011-03-25 ENCOUNTER — Encounter: Payer: Self-pay | Admitting: Internal Medicine

## 2011-03-25 ENCOUNTER — Ambulatory Visit (INDEPENDENT_AMBULATORY_CARE_PROVIDER_SITE_OTHER): Payer: Medicare Other | Admitting: Internal Medicine

## 2011-03-25 VITALS — BP 142/78 | HR 80 | Ht 64.0 in | Wt 281.8 lb

## 2011-03-25 DIAGNOSIS — B029 Zoster without complications: Secondary | ICD-10-CM

## 2011-03-25 HISTORY — DX: Zoster without complications: B02.9

## 2011-03-25 NOTE — Progress Notes (Signed)
Patient ID: Melissa Villa, female    DOB: 1946/03/24, 65 y.o.   MRN: [id number]  HPI 10/12/10- 65 yoF former smoker seen at kind request of Dr Meredith Pel for sleep evaluation. She complains of difficulty maintaining sleep, waking 4-5 x/ night. Has had 2 sleep studies positive for OSA. Sleeps every night with CPAP, but can't maintain sleep and fights daytime somnolence.  CPAP keeps her awake, but cna't fall asleep without it. The mask cuts her nose and leaks. Has tried Ambien, Rozerem and others. Lives alone and unsure about snoring or leg movement.  Has had a significant cough with a cold but feeling better. R lobectomy 2003 for ? Cancer vs benign nodule. Lumpectomy breast for cancer.  11/12/10- 65 yoF former smoker followed for OSA, restrictive lung disease/ hx R lobectomy/ Carcinoid tumor, complicated by hx breast Ca [facility], with recommendation that she have a new titration for pressure adjustment. She is willing "because I want some rest". New full face mask fits better.   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 Key was that her O2 sat wouldn't stay up even on 1 L/M, recording 73 minutes less than 88%. I explained this may be why she feels unable to sleep well.  She had restrictive lung disease on pulmonary function testing 08/05/2010 which may reflect her lobectomy as well as radiation therapy for her breast cancer and her obesity.  02/03/11-  65 yoF former smoker followed for OSA, restrictive lung disease/ hx R lobectomy/ Carcinoid tumor, complicated by hx breast Ca She is using CPAP at 11 CWP plus oxygen at 2 L/Advanced all night every night. Insomnia remains a problem, she doesn't sleep soundly through the night and is sleepy without naps in the daytime. Temazepam 15 mg is not enough. She takes 2 hours to fall asleep and then if she  wakes to the bathroom she again needs a long time before she can return to sleep.  03/25/11-   65 yoF former smoker followed for OSA, restrictive lung disease/ hx R lobectomy/ Carcinoid tumor, complicated by hx breast Ca Acute visit- 2 weeks ago developed pruritic burning rash on the left ribs. We called in valacyclovirand a prednisone taper based on her verbal description and she comes is requested to let us see the rash. 2 small children weather today, neither of whom has had chickenpox  Review of Systems- see HPI Constitutional:   No weight loss, night sweats,  Fevers, chills, fatigue, lassitude. HEENT:   No headaches,  Difficulty swallowing,  Tooth/dental problems,  Sore throat,                No sneezing, itching, ear ache, nasal congestion, post nasal drip,  CV:  No chest pain,  Orthopnea, PND, swelling in lower extremities, anasarca, dizziness, palpitations GI  No heartburn, indigestion, abdominal pain, nausea, vomiting, diarrhea, change in bowel habits, loss of appetite Resp: +Shortness of breath with exertion, not at rest.   Non-productive cough,  No coughing up of blood.  No change in color of mucus.  No wheezing.  Skin: . GU:  MS: Psych:  No change in mood or affect. No depression or anxiety.  No memory loss.    Objective:   Physical Exam General- Alert, Oriented, Affect-appropriate, Distress- none acute, obese    Skin-crusting lesions in dermatome pattern mid left  side of chest quite consistent with shingles. Lymphadenopathy- none Head- atraumatic Eyes- Gross vision intact, PERRLA, conjunctivae clear secretions Ears- Hearing, canals, Tm normal Nose- Clear, No- Septal dev, mucus, polyps, erosion, perforation   Scar bridge of nose Throat- Mallampati II , mucosa clear , drainage- none, tonsils- atrophic Neck- flexible , trachea midline, no stridor , thyroid nl, carotid no bruit Chest - symmetrical excursion , unlabored    Right pacemaker Heart/CV- RRR , 1/6 systolic murmur right  upper sternal area , no gallop  , no rub, nl s1  - JVD- none , edema- none, stasis changes- none, varices- none  Lung- clear to P&A, wheeze- none,  , dullness-none, rub- none    Chest wall-  Abd-  Br/ Gen/ Rectal- Not done, not indicated Extrem-

## 2011-03-25 NOTE — Patient Instructions (Signed)
Go ahead with the prescriptions we sent for prednisone and valacyclovir for your shingles  Keep your appointment with Dr Meredith Pel  continue your other treatment as before

## 2011-03-28 ENCOUNTER — Encounter: Payer: Self-pay | Admitting: Internal Medicine

## 2011-03-28 DIAGNOSIS — B029 Zoster without complications: Secondary | ICD-10-CM | POA: Insufficient documentation

## 2011-03-28 NOTE — Assessment & Plan Note (Signed)
She will go ahead with her valacyclovir ear and prednisone taper as discussed.

## 2011-04-07 ENCOUNTER — Encounter: Payer: Self-pay | Admitting: Internal Medicine

## 2011-04-07 ENCOUNTER — Ambulatory Visit (INDEPENDENT_AMBULATORY_CARE_PROVIDER_SITE_OTHER): Payer: Medicare Other | Admitting: Internal Medicine

## 2011-04-07 VITALS — BP 134/69 | HR 81 | Temp 98.0°F | Ht 64.0 in | Wt 280.5 lb

## 2011-04-07 DIAGNOSIS — E119 Type 2 diabetes mellitus without complications: Secondary | ICD-10-CM

## 2011-04-07 DIAGNOSIS — M79609 Pain in unspecified limb: Secondary | ICD-10-CM

## 2011-04-07 DIAGNOSIS — M25519 Pain in unspecified shoulder: Secondary | ICD-10-CM

## 2011-04-07 DIAGNOSIS — M79606 Pain in leg, unspecified: Secondary | ICD-10-CM

## 2011-04-07 DIAGNOSIS — I10 Essential (primary) hypertension: Secondary | ICD-10-CM

## 2011-04-07 LAB — COMPLETE METABOLIC PANEL WITH GFR
ALT: 22 U/L (ref 0–35)
Albumin: 3.5 g/dL (ref 3.5–5.2)
Alkaline Phosphatase: 64 U/L (ref 39–117)
Potassium: 4.6 mEq/L (ref 3.5–5.3)
Sodium: 136 mEq/L (ref 135–145)
Total Bilirubin: 0.2 mg/dL — ABNORMAL LOW (ref 0.3–1.2)
Total Protein: 7.2 g/dL (ref 6.0–8.3)

## 2011-04-07 LAB — GLUCOSE, CAPILLARY: Glucose-Capillary: 396 mg/dL — ABNORMAL HIGH (ref 70–99)

## 2011-04-07 MED ORDER — INSULIN GLARGINE 100 UNIT/ML ~~LOC~~ SOLN: 28.0000 [IU] | Freq: Every day | SUBCUTANEOUS | Status: DC

## 2011-04-07 MED ORDER — HYDROCODONE-ACETAMINOPHEN 5-325 MG PO TABS: ORAL_TABLET | ORAL | Status: DC

## 2011-04-07 MED ORDER — SITAGLIPTIN PHOSPHATE 50 MG PO TABS: 50.0000 mg | ORAL_TABLET | Freq: Every day | ORAL | Status: DC

## 2011-04-07 MED ORDER — HYDROCODONE-ACETAMINOPHEN 5-325 MG PO TABS: 1.0000 | ORAL_TABLET | Freq: Four times a day (QID) | ORAL | Status: DC | PRN

## 2011-04-07 NOTE — Progress Notes (Addendum)
  Subjective:    Patient ID: Melissa Villa, female    DOB: 06-13-1945, 65 y.o.   MRN: [id number]  HPI Patient returns for followup of her diabetes mellitus, hypertension, chronic pain, and other chronic problems.  Today she reports generally feeling bad, with increased pain symptoms including upper and lower extremity pain, and right costal pain which is chronic.  She was recently treated with Valtrex and a prednisone taper for shingles involving a left thoracic dermatome; the shingles rash has cleared, although she has some intermittent sharp pain in that area.  Her blood sugars have been elevated (see copy of meter download).  She reports sleeping better and using her CPAP more consistently since she has been followed by Dr. Maple Hudson at [facility].  Patient reports a nonproductive cough precipitated by exertion such as walking.   Review of Systems  Constitutional: Negative for fever and chills.  Cardiovascular: Negative for leg swelling.  Gastrointestinal: Negative for nausea, vomiting and abdominal pain.       Objective:   Physical Exam  Cardiovascular: Normal rate, regular rhythm and normal heart sounds.   Pulmonary/Chest: Effort normal and breath sounds normal. She has no wheezes. She has no rales.  Abdominal: Soft. Bowel sounds are normal. There is no tenderness. There is no guarding.  Musculoskeletal: She exhibits no edema.       Right foot: She exhibits deformity.       Feet:  Skin:          Assessment & Plan:

## 2011-04-07 NOTE — Assessment & Plan Note (Signed)
Patient's pain control has deteriorated over the past few months.  The plan is to increase hydrocodone 5 mg/acetaminophen 325 mg to a dose of one and one half tablets 4 times a day as needed for pain.  I discussed the possible addition of gabapentin to her regimen, but patient has been on this in the past and reports that it caused sedation but did not help her pain.

## 2011-04-07 NOTE — Assessment & Plan Note (Signed)
Lab Results  Component Value Date   NA 141 12/16/2010   K 4.2 12/16/2010   CL 104 12/16/2010   CO2 20 12/16/2010   BUN 13 12/16/2010   CREATININE 0.95 12/16/2010    BP Readings from Last 3 Encounters:  04/07/11 134/69  03/25/11 142/78  02/03/11 160/78    Assessment: Hypertension control:  mildly elevated  Progress toward goals:  improved Barriers to meeting goals:  no barriers identified  Plan: Hypertension treatment:  continue current medications

## 2011-04-07 NOTE — Assessment & Plan Note (Signed)
Hemoglobin A1C  Date Value Range Status  04/07/2011 10.7   Final  11/03/2010 7.1   Final  07/29/2010 7.3   Final     Assessment: Diabetes control: not controlled Progress toward goals: Patient's control has markedly deteriorated since July, when we changed to Byetta, stopped Januvia, and decreased her Lantus insulin. Barriers to meeting goals: no barriers identified  Plan: Diabetes treatment: Plan is continue Byetta at current dose; increase Lantus to a dose of 28 units daily; restart Januvia at a dose of 50 mg daily; continue metformin at current dose. Refer to: diabetes educator for self-management training Instruction/counseling given: reminded to bring blood glucose meter & log to each visit and reminded to bring medications to each visit

## 2011-04-07 NOTE — Patient Instructions (Addendum)
Increase Lantus insulin to a dose of 28 units daily. Start Januvia 50 mg daily. Increase hydrocodone 5 mg-acetaminophen 325 mg to a dose of 1-1/2 tablets 4 times a day as needed for pain. Please schedule an appointment with diabetes educator Norm Parcel.

## 2011-04-15 ENCOUNTER — Other Ambulatory Visit: Payer: Self-pay | Admitting: Internal Medicine

## 2011-04-16 ENCOUNTER — Other Ambulatory Visit: Payer: Self-pay | Admitting: Internal Medicine

## 2011-04-16 MED ORDER — BENZONATATE 100 MG PO CAPS: 100.0000 mg | ORAL_CAPSULE | Freq: Three times a day (TID) | ORAL | Status: AC | PRN

## 2011-04-16 NOTE — Progress Notes (Signed)
I contacted patient's pulmonologist Dr. Maple Hudson, who felt that Melissa Villa is a reasonable thing to try for cough suppression.  He will see her in followup and assess how she is doing on this medication.  I called her today and discussed this with her, and will submit a prescription to her pharmacy.

## 2011-05-05 ENCOUNTER — Ambulatory Visit (INDEPENDENT_AMBULATORY_CARE_PROVIDER_SITE_OTHER): Payer: Medicare Other | Admitting: Internal Medicine

## 2011-05-05 ENCOUNTER — Encounter: Payer: Self-pay | Admitting: Internal Medicine

## 2011-05-05 VITALS — BP 134/66 | HR 95 | Ht 64.0 in | Wt 283.0 lb

## 2011-05-05 DIAGNOSIS — J449 Chronic obstructive pulmonary disease, unspecified: Secondary | ICD-10-CM

## 2011-05-05 DIAGNOSIS — G4733 Obstructive sleep apnea (adult) (pediatric): Secondary | ICD-10-CM

## 2011-05-05 DIAGNOSIS — G4700 Insomnia, unspecified: Secondary | ICD-10-CM

## 2011-05-05 MED ORDER — TRAMADOL HCL 50 MG PO TABS: 50.0000 mg | ORAL_TABLET | Freq: Four times a day (QID) | ORAL | Status: DC | PRN

## 2011-05-05 NOTE — Patient Instructions (Addendum)
Try putting your CPAP on a lower chair or stool so it is a little lower than ear level to see if that will reudce the noise.  Try ear plugs when you sleep  Try using the Advair just once daily in the morning for a week. See if that reduces your waking during the night without letting your cough get worse  Try tramadol, script sent, for cough if needed

## 2011-05-05 NOTE — Progress Notes (Signed)
Patient ID: Melissa Villa, female    DOB: 12-Jul-1945, 66 y.o.   MRN: [id number]  HPI 10/12/10- 36 yoF former smoker seen at kind request of Dr Meredith Pel for sleep evaluation. She complains of difficulty maintaining sleep, waking 4-5 x/ night. Has had 2 sleep studies positive for OSA. Sleeps every night with CPAP, but can't maintain sleep and fights daytime somnolence.  CPAP keeps her awake, but cna't fall asleep without it. The mask cuts her nose and leaks. Has tried Ambien, Rozerem and others. Lives alone and unsure about snoring or leg movement.  Has had a significant cough with a cold but feeling better. R lobectomy 2003 for ? Cancer vs benign nodule. Lumpectomy breast for cancer.  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 She is willing "because I want some rest". New full face mask fits better.   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 Key was that her O2 sat wouldn't stay up even on 1 L/M, recording 73 minutes less than 88%. I explained this may be why she feels unable to sleep well.  She had restrictive lung disease on pulmonary function testing 08/05/2010 which may reflect her lobectomy as well as radiation therapy for her breast cancer and her obesity.  02/03/11-  84 yoF former smoker followed for OSA, restrictive lung disease/ hx R lobectomy/ Carcinoid tumor, complicated by hx breast Ca She is using CPAP at 11 CWP plus oxygen at 2 L/Advanced all night every night. Insomnia remains a problem, she doesn't sleep soundly through the night and is sleepy without naps in the daytime. Temazepam 15 mg is not enough. She takes 2 hours to fall asleep and then if she  wakes to the bathroom she again needs a long time before she can return to sleep.  03/25/11-   58 yoF former smoker followed for OSA, restrictive lung disease/ hx R lobectomy/ Carcinoid tumor, complicated by hx breast Ca Acute visit- 2 weeks ago developed pruritic burning rash on the left ribs. We called in valacyclovirand a prednisone taper based on her verbal description and she comes is requested to let us see the rash. 2 small children weather today, neither of whom has had chickenpox  05/05/11- 65 yoF former smoker followed for OSA, restrictive lung disease/ hx R lobectomy/ Carcinoid tumor, complicated by hx breast Ca  Benzonatate has been a big help but she still coughs some- dry.  CXR 06/22/2010 showed normal heart mild vascular congestion and pacemaker with surgical clips in both axillae.  With clonazepam she has less waking.  She wants to know why she is waking up and we talked about her sleep apnea status that she continues CPAP. We also discussed whether Advair at night would be keeping her awake.   Review of Systems- see HPI Constitutional:   No weight loss, night sweats,  Fevers, chills, fatigue, lassitude. HEENT:   No headaches,  Difficulty swallowing,  Tooth/dental problems,  Sore throat,                No sneezing, itching, ear ache, nasal congestion, post nasal drip,  CV:  No chest pain,  Orthopnea, PND, swelling in lower extremities, anasarca, dizziness, palpitations  GI  No heartburn, indigestion, abdominal pain, nausea, vomiting, diarrhea, change in bowel habits, loss of appetite Resp: +Shortness of breath with exertion, not at rest.   Non-productive cough,  No coughing up of blood.  No change in color of mucus.  No wheezing.  Skin: . No rash GU:  MS: Psych:  No change in mood or affect. No depression or anxiety.  No memory loss.    Objective:   Physical Exam General- Alert, Oriented, Affect-appropriate, Distress- none acute, obese    Skin-crusting lesions in dermatome  pattern mid left side of chest quite consistent with shingles. Lymphadenopathy- none Head- atraumatic Eyes- Gross vision intact, PERRLA, conjunctivae clear secretions Ears- Hearing, canals, Tm normal Nose- Clear, No- Septal dev, mucus, polyps, erosion, perforation   Scar bridge of nose Throat- Mallampati II , mucosa clear , drainage- none, tonsils- atrophic Neck- flexible , trachea midline, no stridor , thyroid nl, carotid no bruit Chest - symmetrical excursion , unlabored    Right pacemaker Heart/CV- RRR , 1/6 systolic murmur right upper sternal area , no gallop  , no rub, nl s1  - JVD- none , edema- none, stasis changes- none, varices- none  Lung- clear to P&A, cough is triggered by deep breath ; wheeze- none,  , dullness-none, rub- none    Chest wall- R pacemaker Abd-  Br/ Gen/ Rectal- Not done, not indicated Extrem-

## 2011-05-06 ENCOUNTER — Telehealth: Payer: Self-pay | Admitting: Internal Medicine

## 2011-05-06 ENCOUNTER — Encounter: Payer: Medicare Other | Admitting: *Deleted

## 2011-05-06 NOTE — Telephone Encounter (Signed)
New Msg: pt calling c/o having difficulty sending remote device check. Please return pt call to discuss further/advise.

## 2011-05-06 NOTE — Telephone Encounter (Signed)
Spoke w/pt in regards to sending transmissions. Pt switched phone companies to [employer] and is unable to send transmissions. Scheduled pt for in office check on 05-12-11 @ 1200. Pt is aware of appointment.

## 2011-05-07 ENCOUNTER — Encounter: Payer: Medicare Other | Admitting: Dietician

## 2011-05-07 ENCOUNTER — Encounter: Payer: Medicare Other | Admitting: Internal Medicine

## 2011-05-08 ENCOUNTER — Encounter: Payer: Self-pay | Admitting: Internal Medicine

## 2011-05-08 NOTE — Assessment & Plan Note (Signed)
Nonspecific waking after sleep onset despite management of sleep apnea. She will continue low-dose clonazepam. For just a one-week trial, she will take Advair only once each day, in the morning. That will take the stimulant effect away from nighttime and we will see  What changes that makes.

## 2011-05-08 NOTE — Assessment & Plan Note (Signed)
Cough is nonspecific with the usual differential diagnosis. Her last chest x-ray, March 2012, suggested mild vascular congestion which can cause cough. Reducing Advair may affect cough and we will watch that. Discussed use of benzonatate.

## 2011-05-08 NOTE — Assessment & Plan Note (Addendum)
CPAP control and compliance are good.  Put CPAP lower than ear level to reduce noise until she qualifies for a new machine.

## 2011-05-10 ENCOUNTER — Encounter: Payer: Self-pay | Admitting: *Deleted

## 2011-05-12 ENCOUNTER — Encounter: Payer: Self-pay | Admitting: Internal Medicine

## 2011-05-12 ENCOUNTER — Ambulatory Visit: Payer: Medicare Other | Admitting: Internal Medicine

## 2011-05-12 ENCOUNTER — Ambulatory Visit (INDEPENDENT_AMBULATORY_CARE_PROVIDER_SITE_OTHER): Payer: Medicare Other | Admitting: *Deleted

## 2011-05-12 ENCOUNTER — Ambulatory Visit (INDEPENDENT_AMBULATORY_CARE_PROVIDER_SITE_OTHER): Payer: Medicare Other | Admitting: Internal Medicine

## 2011-05-12 ENCOUNTER — Ambulatory Visit (INDEPENDENT_AMBULATORY_CARE_PROVIDER_SITE_OTHER): Payer: Medicare Other | Admitting: Dietician

## 2011-05-12 VITALS — BP 158/76 | HR 98 | Temp 98.8°F | Wt 280.6 lb

## 2011-05-12 DIAGNOSIS — I442 Atrioventricular block, complete: Secondary | ICD-10-CM

## 2011-05-12 DIAGNOSIS — G4733 Obstructive sleep apnea (adult) (pediatric): Secondary | ICD-10-CM

## 2011-05-12 DIAGNOSIS — E119 Type 2 diabetes mellitus without complications: Secondary | ICD-10-CM

## 2011-05-12 DIAGNOSIS — I10 Essential (primary) hypertension: Secondary | ICD-10-CM

## 2011-05-12 LAB — PACEMAKER DEVICE OBSERVATION
AL IMPEDENCE PM: 537.5 Ohm
BAMS-0003: 60 {beats}/min
BATTERY VOLTAGE: 2.9478 V
DEVICE MODEL PM: 2277328
RV LEAD IMPEDENCE PM: 475 Ohm

## 2011-05-12 MED ORDER — INSULIN GLARGINE 100 UNIT/ML ~~LOC~~ SOLN: 35.0000 [IU] | Freq: Every day | SUBCUTANEOUS | Status: DC

## 2011-05-12 MED ORDER — VALSARTAN 80 MG PO TABS: ORAL_TABLET | ORAL | Status: DC

## 2011-05-12 NOTE — Progress Notes (Signed)
Medical Nutrition Therapy:  Appt start time: 1040 end time:  1100.  Assessment:  Primary concerns today: Blood sugar control Usual eating pattern includes Meal 2 and 1+ snacks per day.    Avoided foods include: soda.Says she is sleeping better. Takes all of her diabetes Medicines as prescribed except once in a while misses the afternoon dose of metformin. Does not think Byetta is helping blood sugars at all.  Usual physical activity includes has [employer] through her Medicare but has not gotten to go. Says she is too tired and busy with grandchildren. Tries to walk as much as possible.  Diagnosis and Intervention:  Progress Towards Goal(s):  No progress   Nutritional Diagnosis:  NB-1.3 Not ready for diet/lifestyle change continues as related to due to competing priorities. NB-1.6 Limited adherence to nutrition-related recommendations As related to competing priorities and not being ready for lifestyle change  As evidenced by her report and disinterest in weight and Medicare Intensive Behavioral therapy for obesityl.  Interventions: 1- provided information about local diabetes support program 2- Encouraged her to fit in more activity as able. 3- Social support provided and questions about diabetes medications answered.     Monitoring/Evaluation:  Dietary intake and blood sugars prn

## 2011-05-12 NOTE — Progress Notes (Signed)
Pacemaker check in clinic  

## 2011-05-12 NOTE — Patient Instructions (Signed)
Return to clinic in 1 month

## 2011-05-13 ENCOUNTER — Encounter: Payer: Self-pay | Admitting: Internal Medicine

## 2011-05-13 NOTE — Assessment & Plan Note (Signed)
Sees pulmonary, uses CPAP

## 2011-05-13 NOTE — Assessment & Plan Note (Addendum)
Continue Byetta, Januvia, and Lantus.  Will increase Lantus to 35 units per day as sugars have been too high based on review today and my discussion with diabetes educator.  Fu with PCP next month.

## 2011-05-13 NOTE — Assessment & Plan Note (Signed)
BP today on recheck 158/76, still too high.  Will increase Diovan to 160mg  qam and 80mg  qpm.  Recheck at PCP visit next month.

## 2011-05-13 NOTE — Progress Notes (Signed)
Subjective:   Patient ID: Melissa Villa female   DOB: 1945/05/19 66 y.o.   MRN: [id number]  HPI: Melissa Villa is a 66 y.o. with COPD, HTN, DM here for follow-up. In the summer in an attempt to help her lose weight her lantus dose was decreased as byetta was added.  Her HbA1c climbed to over 10 from 7.1 as a result.  Lantus dose was then increased again.  She is on Januvia as well.  Sugars are in much better control, seeming to average 160-180 per her report today.  She has increased her lantus dose from the prescribed 28 units to 30 units on her own.  She also takes metformin.     She sees cardiology for her heart care and pulmonary for her COPD/OSA.      Past Medical History  Diagnosis Date  . CHF (congestive heart failure)      2-D echo 02/19/2009 showed left ventricle cavity size normal; systolic function normal, estimated ejection fraction 55%; wall motion normal; no regional wall motion abnormalities; PA peak pressure 47mm Hg.  [name] COPD (chronic obstructive pulmonary disease)     Pulmonary function tests on 08/05/2010 showed mixed obstructive and restrictive lung disease with no significant response to bronchodilator, and decreased diffusion capacity that corrects to normal range when adjusted for the inhaled alveolar volume.  . Hypertension   . TIA (transient ischemic attack)   . Anemia   . 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  Patient is followed by Dr. [name] at the cancer center.  . Degenerative joint disease   . AV block, complete     S/P dual-chamber for symptomatic episodes of bradycardia and complete heart block.  . Pacemaker     S/P dual-chamber for symptomatic episodes of  bradycardia and complete heart block.  . Adenomatous polyp of colon     Tubular adenomas X 2 found 06/1999; negative screening colonoscopy 02/13/2004 by Dr. [name].  . Hypercholesteremia   . Obstructive sleep apnea     Baseline diagnostic nocturnal polysomnogram on July 27, 2005 showed severe obstructive sleep apnea/hypopnea syndrome, with AHI 153.2 per hour.  Last nocturnal polysomnogram done for CPAP titration was 05/01/2009; CPAP was titrated to 13 CWP, AHI 1.8 per hour using a large Respironics FitLife Full Face Mask with heated humidifier.  . Peripheral autonomic neuropathy due to diabetes mellitus   . 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 [name]  . Constipation   . Hand pain   . Palpitation   . Rib pain     right sided   . DM type 2 (diabetes mellitus, type 2)   . Skin rash   . Urinary incontinence   . Proliferative diabetic retinopathy     S/P panretinal photocoagulation by Dr. [name]  . Vitreous hemorrhage     With tractional detachment, left eye; S/P posterior vitrectomy with membrane peel by Dr. [name] 04/06/2005  . Dyspnea on exertion     Cardiac cath 06/17/2005 by Dr. [name] showed LVEF 50-55%, 30% proximal LAD stenosis, small diagonals.   . Shingles rash 03/25/11   Current Outpatient Prescriptions  Medication Sig Dispense  Refill  . albuterol (PROAIR HFA) 108 (90 BASE) MCG/ACT inhaler Inhale 2 puffs into the lungs 4 (four) times daily as needed.  1 Inhaler  11  . albuterol (PROAIR HFA) 108 (90 BASE) MCG/ACT inhaler Inhale 2 puffs into the lungs every 6 (six) hours as needed.        [name] amLODipine (NORVASC) 10 MG tablet Take 1 tablet (10 mg total) by mouth daily.  31 tablet  11  . Calcium Carbonate-Vitamin D (CALCIUM 600+D) 600-400 MG-UNIT per tablet Take 1 tablet by mouth 2 (two) times daily.        . carvedilol (COREG) 12.5 MG tablet TAKE 1 AND 1/2 TABLETS BY MOUTH 2 TIMES A DAY  90 tablet  6  . cetirizine (ZYRTEC) 10  MG tablet Take 1 tablet (10 mg total) by mouth daily. For seasonal allergies.  30 tablet  11  . clonazePAM (KLONOPIN) 0.5 MG tablet 1 or 2 for sleep as needed. 1/2 hour before bedtime.  60 tablet  5  . Dexlansoprazole (DEXILANT) 60 MG capsule Take 60 mg by mouth every morning. Take 15 minutes before breakfast.      . dipyridamole-aspirin (AGGRENOX) 25-200 MG per 12 hr capsule Take 1 capsule by mouth 2 (two) times daily.  62 each  11  . exenatide (BYETTA 10 MCG PEN) 10 MCG/0.04ML SOLN Inject 0.04 mLs (10 mcg total) into the skin 2 (two) times daily with a meal.  2.4 mL  11  . Fluticasone-Salmeterol (ADVAIR DISKUS) 250-50 MCG/DOSE AEPB Inhale 1 puff into the lungs 2 (two) times daily.  60 each  11  . furosemide (LASIX) 20 MG tablet Take 1 tablet (20 mg total) by mouth daily.  30 tablet  3  . glucose blood (ACCU-CHEK AVIVA PLUS) test strip 1 each by Other route as needed. Use as to check blood sugar up to 3 times daily as instructed DX code:250.00      . HYDROcodone-acetaminophen (NORCO) 5-325 MG per tablet Take one and one-half tablets four times a day as needed for pain.  180 tablet  3  . insulin glargine (LANTUS SOLOSTAR) 100 UNIT/ML injection Inject 35 Units into the skin daily.  15 mL  6  . Insulin Pen Needle (PEN NEEDLES 31GX5/16") 31G X 8 MM MISC Use as directed for Insulin and Byetta injections. Dx Code 250.00  200 each  12  . Lancets Misc. (ACCU-CHEK FASTCLIX LANCET) KIT by Does not apply route. Use as to check blood sugar up to 3 times daily as instructed DX code:250.00       . metFORMIN (GLUCOPHAGE) 850 MG tablet TAKE ONE (1) TABLET                     THREE (3) TIMES EACH DAY  90 tablet  2  . mometasone (NASONEX) 50 MCG/ACT nasal spray 2 sprays by Nasal route daily.       . multivitamin (THERAGRAN) per tablet Take 1 tablet by mouth daily.        . rosuvastatin (CRESTOR) 5 MG tablet Take 1 tablet (5 mg total) by mouth daily.  30 tablet  3  . sitaGLIPtin (JANUVIA) 50 MG tablet Take 1 tablet  (50 mg total) by mouth daily.  30 tablet  6  . tiotropium (SPIRIVA) 18 MCG inhalation capsule Place 1 capsule (18 mcg total) into inhaler and inhale daily.  30 capsule  11  . traMADol (ULTRAM) 50 MG tablet Take 1 tablet (50 mg total) by mouth  every 6 (six) hours as needed for pain (cough).  20 tablet  0  . valsartan (DIOVAN) 80 MG tablet Take two tablets (160mg ) by mouth in the morning and one tablet (80mg ) by mouth at night each day  90 tablet  11   Family History  Problem Relation Age of Onset  . Breast cancer Mother 72    Deceased 10/11/02  . Diabetes type II Mother   . Diabetes type II Brother   . Hypertension Brother   . Heart attack Brother   . Stroke Brother   . Hypertension Brother   . Hypertension Sister   . Cervical cancer Neg Hx   . Colon cancer Neg Hx    History   Social History  . Marital Status: Widowed    Spouse Name: N/A    Number of Children: N/A  . Years of Education: N/A   Social History Main Topics  . Smoking status: Former Smoker -- 1.0 packs/day for 15 years    Types: Cigarettes    Quit date: 04/19/2001  . Smokeless tobacco: Never Used  . Alcohol Use: No  . Drug Use: No  . Sexually Active: None   Other Topics Concern  . None   Social History Narrative  . None   Review of Systems: Constitutional: Denies fever, chills, diaphoresis, appetite change and fatigue.  Respiratory: Denies SOB, DOE, cough, chest tightness,  and wheezing.   Cardiovascular: Denies chest pain, palpitations and leg swelling.  Gastrointestinal: Denies nausea, vomiting, abdominal pain, diarrhea, constipation, blood in stool and abdominal distention.  Neurological: Denies dizziness, seizures, syncope, weakness, light-headedness, numbness and headaches.    Objective:  Physical Exam: Filed Vitals:   05/12/11 0958 05/12/11 1000  BP: 181/88 158/76  Pulse: 98   Temp: 98.8 F (37.1 C)   TempSrc: Oral   Weight: 280 lb 9.6 oz (127.279 kg)    Constitutional: Vital signs reviewed.   Patient is a well-developed and well-nourished obese woman in no acute distress and cooperative with exam. Alert and oriented x3.  Head: Normocephalic and atraumatic Mouth: no erythema or exudates, MMM Eyes: PERRL, EOMI, conjunctivae normal, No scleral icterus.  Neck: No JVD Cardiovascular: RRR, S1 normal, S2 normal, no MRG, pulses symmetric and intact bilaterally Pulmonary/Chest: CTAB, no wheezes, rales, or rhonchi Neurological: A&O x3, Strength is normal and symmetric bilaterally in arms, cranial nerve II-XII are grossly intact, no focal motor deficit, sensory intact to light touch bilaterally.  Skin: Warm, dry and intact. No rash, cyanosis, or clubbing.   Assessment & Plan:   .

## 2011-05-18 NOTE — Progress Notes (Signed)
I discussed patient with resident Dr. Wainright, [name] agree with the plans as outlined in his note. 

## 2011-05-25 ENCOUNTER — Telehealth: Payer: Self-pay | Admitting: Internal Medicine

## 2011-05-25 DIAGNOSIS — G4733 Obstructive sleep apnea (adult) (pediatric): Secondary | ICD-10-CM

## 2011-05-25 NOTE — Telephone Encounter (Signed)
I spoke with pt and she states she has been having problems with the head gear for her cpap. Pt states it is cutting into her nose. She can't loosen the head gear bc the air seeps through it and it causes her to have a severe headache. Pt is wanting to stop the cpap and just use oxygen at night since she has tried different mask options. Please advise Dr. Maple Hudson, thanks

## 2011-05-25 NOTE — Telephone Encounter (Signed)
Per CY-He would like her to get set up with [facility] for referral for CPAP mask fitting first. If we cant do better we will look at the O2 more.

## 2011-05-25 NOTE — Telephone Encounter (Signed)
Pt agree 'd to go over to the sleep center for another cpap mask fit w/ the daytime staff. I have placed order and pt aware they will be calling her w/ an apt

## 2011-05-26 ENCOUNTER — Encounter: Payer: Self-pay | Admitting: Internal Medicine

## 2011-05-26 ENCOUNTER — Ambulatory Visit (HOSPITAL_BASED_OUTPATIENT_CLINIC_OR_DEPARTMENT_OTHER): Payer: Medicare Other | Attending: Internal Medicine

## 2011-05-26 DIAGNOSIS — G4733 Obstructive sleep apnea (adult) (pediatric): Secondary | ICD-10-CM

## 2011-06-09 ENCOUNTER — Ambulatory Visit (INDEPENDENT_AMBULATORY_CARE_PROVIDER_SITE_OTHER): Payer: Medicare Other | Admitting: Internal Medicine

## 2011-06-09 ENCOUNTER — Encounter: Payer: Self-pay | Admitting: Internal Medicine

## 2011-06-09 ENCOUNTER — Other Ambulatory Visit: Payer: Self-pay | Admitting: Dietician

## 2011-06-09 DIAGNOSIS — M79609 Pain in unspecified limb: Secondary | ICD-10-CM

## 2011-06-09 DIAGNOSIS — I10 Essential (primary) hypertension: Secondary | ICD-10-CM

## 2011-06-09 DIAGNOSIS — G4700 Insomnia, unspecified: Secondary | ICD-10-CM

## 2011-06-09 DIAGNOSIS — E119 Type 2 diabetes mellitus without complications: Secondary | ICD-10-CM

## 2011-06-09 DIAGNOSIS — M79606 Pain in leg, unspecified: Secondary | ICD-10-CM

## 2011-06-09 DIAGNOSIS — Z79899 Other long term (current) drug therapy: Secondary | ICD-10-CM

## 2011-06-09 LAB — GLUCOSE, CAPILLARY: Glucose-Capillary: 160 mg/dL — ABNORMAL HIGH (ref 70–99)

## 2011-06-09 LAB — POCT GLYCOSYLATED HEMOGLOBIN (HGB A1C): Hemoglobin A1C: 8.7

## 2011-06-09 MED ORDER — CARVEDILOL 12.5 MG PO TABS: 25.0000 mg | ORAL_TABLET | Freq: Two times a day (BID) | ORAL | Status: DC

## 2011-06-09 MED ORDER — AMITRIPTYLINE HCL 10 MG PO TABS: 10.0000 mg | ORAL_TABLET | Freq: Every day | ORAL | Status: DC

## 2011-06-09 NOTE — Assessment & Plan Note (Signed)
Lab Results  Component Value Date   NA 136 04/07/2011   K 4.6 04/07/2011   CL 101 04/07/2011   CO2 21 04/07/2011   BUN 14 04/07/2011   CREATININE 0.82 04/07/2011   CREATININE 1.24* 06/24/2010    BP Readings from Last 3 Encounters:  06/09/11 149/82  05/12/11 158/76  05/05/11 134/66    Assessment: Hypertension control:  mildly elevated  Progress toward goals:  improved Barriers to meeting goals:  no barriers identified  Plan: Hypertension treatment:  Increase carvedilol to 25 mg twice a day; continue other medications as before

## 2011-06-09 NOTE — Patient Instructions (Signed)
Increase carvedilol 12.5 mg to a dose of 2 tablets twice a day. Increase Lantus insulin to a dose of 35 units daily. Start amitriptyline 10 mg one tablet daily at bedtime.

## 2011-06-09 NOTE — Assessment & Plan Note (Signed)
Assessment: Patient's symptoms suggest diabetic neuropathy, and she has evidence of other end organ effects including retinopathy.  We tried gabapentin 100 mg 3 times a day in the past, but this made her drowsy during the day time and did not help her symptoms.  Plan: Continue titration of insulin for better control of her blood sugar; add amitriptyline 10 mg at bedtime.

## 2011-06-09 NOTE — Assessment & Plan Note (Signed)
  Hemoglobin A1C  Date Value Range Status  06/09/2011 8.7   Final  04/07/2011 10.7   Final  11/03/2010 7.1   Final     Assessment: Diabetes control: not controlled Progress toward goals: improved Barriers to meeting goals: no barriers identified  Plan: Diabetes treatment: Increase Lantus insulin to 35 units daily; continue other medications as before Refer to: none Instruction/counseling given: reminded to get eye exam, reminded to bring medications to each visit and discussed the need for weight loss

## 2011-06-09 NOTE — Assessment & Plan Note (Signed)
Assessment: Patient has ongoing problems with insomnia despite the fact that she is taking clonazepam 0.5-1 mg at bedtime as prescribed by Dr. Maple Hudson.  She has tried multiple other medications in the past for insomnia without good results.    Plan: Given her problems with lower extremity pain suggestive of diabetic neuropathy, the plan is to add a low dose of amitriptyline 10 mg at bedtime and continue clonazepam as above.  I discussed with her the potential side effects of amitriptyline including dry mouth and daytime sleepiness and advised to let me know if she has any problems.  She will also followup with Dr. Maple Hudson as scheduled.

## 2011-06-09 NOTE — Progress Notes (Signed)
  Subjective:    Patient ID: Melissa Villa, female    DOB: 1945-11-05, 66 y.o.   MRN: [id number]  HPI Patient returns for followup of her diabetes mellitus, hypertension, hypercholesterolemia, obstructive sleep apnea, and other chronic problems.  Today she reports continued difficulty sleeping, which she feels is partially due to poor fit of her face mask; this was recently replaced at the sleep center.  She has been taking clonazepam 0.5 mg 1 or 2 tablets at bedtime for sleep as prescribed by Dr. Maple Hudson.  She also reports increased lower extremity pain bilaterally in her lateral thighs and also some foot tingling/pain.  She has nagging morning headaches which now are persisting through the day.  Following her last visit, her insulin dose has been 30 units daily rather than the intended 35 units daily; her blood sugars at home have improved compared to 4 months ago, with about 78% of values within target range.   Review of Systems See history of present illness.    Objective:   Physical Exam  Constitutional: No distress.  Cardiovascular: Normal rate, regular rhythm, S1 normal and S2 normal.  Exam reveals no gallop, no S3, no S4 and no friction rub.   Murmur heard.  Systolic murmur is present with a grade of 1/6  Pulmonary/Chest: Effort normal and breath sounds normal. No respiratory distress. She has no wheezes. She has no rales.  Abdominal: Soft. Bowel sounds are normal. There is no tenderness. There is no rebound and no guarding.  Musculoskeletal: She exhibits no edema.        Assessment & Plan:

## 2011-06-10 NOTE — Telephone Encounter (Signed)
Patient was called per her request for assistance in getting pen needles from same company where she gets diabetes supplies. Please see notes from 11-2010 and scanned faxes. Patient requested same order be refaxed again today. Refaxed order and also called mail order company who reports that company is in transition and he was not sure why she had not gotten her pen needles. Will mail letter and refaxed order to patient today.

## 2011-06-16 ENCOUNTER — Other Ambulatory Visit: Payer: Self-pay | Admitting: Internal Medicine

## 2011-06-17 ENCOUNTER — Other Ambulatory Visit: Payer: Self-pay | Admitting: *Deleted

## 2011-06-18 ENCOUNTER — Other Ambulatory Visit: Payer: Self-pay | Admitting: Dietician

## 2011-06-18 NOTE — Telephone Encounter (Signed)
Patient called and left voicemail that [facility] needs rx and still has not gotten one for Melissa Villa to obtain her pen needles. Please send rx to [facility].

## 2011-06-21 MED ORDER — "PEN NEEDLES 5/16"" 31G X 8 MM MISC": Status: DC

## 2011-06-21 NOTE — Telephone Encounter (Signed)
Refill approved - nurse to complete. 

## 2011-06-24 ENCOUNTER — Other Ambulatory Visit: Payer: Self-pay | Admitting: Internal Medicine

## 2011-06-24 MED ORDER — "PEN NEEDLES 5/16"" 31G X 8 MM MISC": Status: DC

## 2011-06-24 NOTE — Telephone Encounter (Signed)
Called [facility] to confirm receipt of the prescription for pen needles.  The [facility] in her medication list is not the correct [facility] for diabetes supplies that Melissa Villa uses. Found out  Her [facility] is in [place] and their number is :[phone] or [phone]. Physician reprinted rx and it was faxed and went through to correct [facility] in [place] , [place] fax number [phone]

## 2011-06-25 ENCOUNTER — Encounter: Payer: Self-pay | Admitting: Internal Medicine

## 2011-06-25 NOTE — Progress Notes (Signed)
Order signed for mastectomy products given need for end of day comfort and to absorb moisture.  Patient has past history of bilateral lumpectomy and axillary node dissection.

## 2011-06-28 ENCOUNTER — Ambulatory Visit (HOSPITAL_COMMUNITY)
Admission: RE | Admit: 2011-06-28 | Discharge: 2011-06-28 | Disposition: A | Discharge status: Home / Self Care | Payer: Medicare Other | Source: Ambulatory Visit | Attending: Oncology | Admitting: Oncology

## 2011-06-28 ENCOUNTER — Ambulatory Visit (HOSPITAL_BASED_OUTPATIENT_CLINIC_OR_DEPARTMENT_OTHER): Payer: Medicare Other | Admitting: Oncology

## 2011-06-28 ENCOUNTER — Other Ambulatory Visit: Payer: Medicare Other | Admitting: Lab

## 2011-06-28 ENCOUNTER — Telehealth: Payer: Self-pay | Admitting: *Deleted

## 2011-06-28 VITALS — BP 141/65 | HR 70 | Temp 97.6°F | Ht 64.0 in | Wt 276.6 lb

## 2011-06-28 DIAGNOSIS — D3A090 Benign carcinoid tumor of the bronchus and lung: Secondary | ICD-10-CM | POA: Insufficient documentation

## 2011-06-28 DIAGNOSIS — Z09 Encounter for follow-up examination after completed treatment for conditions other than malignant neoplasm: Secondary | ICD-10-CM

## 2011-06-28 DIAGNOSIS — C50919 Malignant neoplasm of unspecified site of unspecified female breast: Secondary | ICD-10-CM

## 2011-06-28 DIAGNOSIS — Z8709 Personal history of other diseases of the respiratory system: Secondary | ICD-10-CM

## 2011-06-28 DIAGNOSIS — Z950 Presence of cardiac pacemaker: Secondary | ICD-10-CM | POA: Insufficient documentation

## 2011-06-28 DIAGNOSIS — R7309 Other abnormal glucose: Secondary | ICD-10-CM

## 2011-06-28 DIAGNOSIS — Z853 Personal history of malignant neoplasm of breast: Secondary | ICD-10-CM

## 2011-06-28 DIAGNOSIS — O24919 Unspecified diabetes mellitus in pregnancy, unspecified trimester: Secondary | ICD-10-CM

## 2011-06-28 DIAGNOSIS — Z85118 Personal history of other malignant neoplasm of bronchus and lung: Secondary | ICD-10-CM

## 2011-06-28 LAB — COMPREHENSIVE METABOLIC PANEL
ALT: 17 U/L (ref 0–35)
AST: 18 U/L (ref 0–37)
Albumin: 3.9 g/dL (ref 3.5–5.2)
Alkaline Phosphatase: 45 U/L (ref 39–117)
BUN: 16 mg/dL (ref 6–23)
CO2: 24 mEq/L (ref 19–32)
Calcium: 9.7 mg/dL (ref 8.4–10.5)
Chloride: 105 mEq/L (ref 96–112)
Creatinine, Ser: 0.83 mg/dL (ref 0.50–1.10)
Glucose, Bld: 146 mg/dL — ABNORMAL HIGH (ref 70–99)
Potassium: 4.5 mEq/L (ref 3.5–5.3)
Sodium: 140 mEq/L (ref 135–145)
Total Bilirubin: 0.2 mg/dL — ABNORMAL LOW (ref 0.3–1.2)
Total Protein: 7.5 g/dL (ref 6.0–8.3)

## 2011-06-28 LAB — CBC WITH DIFFERENTIAL/PLATELET
BASO%: 0.4 % (ref 0.0–2.0)
Basophils Absolute: 0 10*3/uL (ref 0.0–0.1)
EOS%: 1.2 % (ref 0.0–7.0)
Eosinophils Absolute: 0.1 10*3/uL (ref 0.0–0.5)
HCT: 37.6 % (ref 34.8–46.6)
HGB: 11.7 g/dL (ref 11.6–15.9)
LYMPH%: 24.7 % (ref 14.0–49.7)
MCH: 25.3 pg (ref 25.1–34.0)
MCHC: 31.1 g/dL — ABNORMAL LOW (ref 31.5–36.0)
MCV: 81.2 fL (ref 79.5–101.0)
MONO#: 0.7 10*3/uL (ref 0.1–0.9)
MONO%: 9.8 % (ref 0.0–14.0)
NEUT#: 4.3 10*3/uL (ref 1.5–6.5)
NEUT%: 63.9 % (ref 38.4–76.8)
Platelets: 264 10*3/uL (ref 145–400)
RBC: 4.63 10*6/uL (ref 3.70–5.45)
RDW: 19.1 % — ABNORMAL HIGH (ref 11.2–14.5)
WBC: 6.7 10*3/uL (ref 3.9–10.3)
lymph#: 1.7 10*3/uL (ref 0.9–3.3)
nRBC: 0 % (ref 0–0)

## 2011-06-28 NOTE — Telephone Encounter (Signed)
gave patient appointment for 06-2012 printed out calendar and gave to the patient sent patient to the radiology department for a chest  x-ray  sent patient back to the lab on 06-28-2011 also gave patient instructions before she comes back to see magrinat she needs to go get another chest x-ray next appointment is for 2014

## 2011-06-28 NOTE — Progress Notes (Signed)
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 The interval history is generally unremarkable. She lives by herself, but during the day she keeps her 64-year-old granddaughter, and then in the middle of the afternoon she picks up in 53-year-old grandchild and she keeps now one as well until the mean.  REVIEW OF SYSTEMS: She describes herself a severely fatigued. Partly this is because of ongoing insomnia problems. She goes to sleep easily but wakes up easily and never wakes up rested. Her legs hurt it 6 g PAT pain is fairly constant and keeps her from walking any distance. She has blurred vision and she is followed for that by [name]. As she keeps a little at bit of a dry cough. When she clears her throat she breathes a little better. She significantly deconditioned patient has some back pain as well some headaches and her diabetes is not well-controlled. She thinks this is because they change her medication "too fast". She does see Dr. Karlene Einstein at the [facility] in addition to Dr. Meredith Pel for followup of that problem. A detailed review of systems was otherwise stable and in particular she has had no rash, flushing, diarrhea, or dizziness.  PAST MEDICAL HISTORY: Past Medical History  Diagnosis Date  . CHF (congestive heart failure)      2-D echo 02/19/2009 showed left ventricle cavity size normal; systolic function normal, estimated ejection fraction 55%; wall motion normal; no regional wall motion abnormalities; PA peak pressure 47mm Hg.  [name] COPD (chronic obstructive pulmonary disease)     Pulmonary function tests on 08/05/2010 showed mixed obstructive and restrictive lung disease with no significant response to bronchodilator, and decreased diffusion capacity that corrects to normal range when adjusted for the inhaled alveolar volume.  .  Hypertension   . TIA (transient ischemic attack)   . Anemia   . 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  Patient is followed by Dr. [name] at the cancer center.  . Degenerative joint disease   . AV block, complete     S/P dual-chamber for symptomatic episodes of bradycardia and complete heart block.  . Pacemaker     S/P dual-chamber for symptomatic episodes of bradycardia and complete heart block.  . Adenomatous polyp of colon     Tubular adenomas X 2 found 06/1999; negative screening colonoscopy 02/13/2004 by Dr. [name].  . Hypercholesteremia   . Obstructive sleep apnea     Baseline diagnostic nocturnal polysomnogram on July 27, 2005 showed severe obstructive sleep apnea/hypopnea syndrome, with AHI 153.2 per hour.  Last nocturnal polysomnogram done for CPAP titration was 05/01/2009; CPAP was titrated to 13 CWP, AHI 1.8 per hour using a large Respironics FitLife Full Face Mask with heated humidifier.  . Peripheral autonomic neuropathy due to diabetes mellitus   . 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 [name]  . Constipation   . Hand pain   . Palpitation   . Rib pain     right sided   . DM type  2 (diabetes mellitus, type 2)   . Skin rash   . Urinary incontinence   . Proliferative diabetic retinopathy     S/P panretinal photocoagulation by Dr. [name]  . Vitreous hemorrhage     With tractional detachment, left eye; S/P posterior vitrectomy with membrane peel by Dr. [name] 04/06/2005  . Dyspnea on exertion     Cardiac cath 06/17/2005 by Dr. [name] showed LVEF 50-55%, 30% proximal LAD stenosis, small diagonals.   . Shingles rash 03/25/11     PAST SURGICAL HISTORY: Past Surgical History  Procedure Date  . Mastectomy partial / lumpectomy w/ axillary lymphadenectomy 1990-09-21, 09/20/93    S/P left lumpectomy and axillary lymph node dissection December 1992 for a T1 N0 medullary breast cancer, no lymph node involvement, treated with tamoxifen for 2 years; S/P right lumpectomy and axillary lymph node dissection August 1995 for a T1 N0 microinvasive breast cancer, treated with Arimidex for 5 years.  Patient is followed by Dr. [name] at the cancer center.  . Lung lobectomy 06/13/2001    S/P right lower lobe lobectomy 06/13/2001 by Dr. [name]  . Pacemaker insertion 04/20/2001  . Cholecystectomy   . Tonsillectomy     FAMILY HISTORY Family History  Problem Relation Age of Onset  . Breast cancer Mother 52    Deceased 09/21/2002  . Diabetes type II Mother   . Diabetes type II Brother   . Hypertension Brother   . Heart attack Brother   . Stroke Brother   . Hypertension Brother   . Hypertension Sister   . Cervical cancer Neg Hx   . Colon cancer Neg Hx     GYNECOLOGIC HISTORY:  SOCIAL HISTORY:    ADVANCED DIRECTIVES:  HEALTH MAINTENANCE: History  Substance Use Topics  . Smoking status: Former Smoker -- 1.0 packs/day for 15 years    Types: Cigarettes    Quit date: 04/19/2001  . Smokeless tobacco: Never Used  . Alcohol Use: No     Colonoscopy:  PAP:  Bone density:  Lipid panel:  No Known Allergies  Current Outpatient Prescriptions  Medication Sig Dispense Refill  . albuterol (PROAIR HFA) 108 (90 BASE) MCG/ACT inhaler Inhale 2 puffs into the lungs every 6 (six) hours as needed.        [name] amitriptyline (ELAVIL) 10 MG tablet Take 1 tablet (10 mg total) by mouth at bedtime.  30 tablet  2  . amLODipine (NORVASC) 10 MG tablet Take 1 tablet (10 mg total) by mouth daily.  31 tablet  11  . benzonatate (TESSALON) 100 MG capsule TAKE 1-2 CAPSULES THREE TIMES DAILY AS  NEEDED FOR COUGH  60 capsule  2  . Calcium Carbonate-Vitamin  D (CALCIUM 600+D) 600-400 MG-UNIT per tablet Take 1 tablet by mouth 2 (two) times daily.        . carvedilol (COREG) 12.5 MG tablet Take 2 tablets (25 mg total) by mouth 2 (two) times daily.  120 tablet  6  . cetirizine (ZYRTEC) 10 MG tablet Take 1 tablet (10 mg total) by mouth daily. For seasonal allergies.  30 tablet  11  . clonazePAM (KLONOPIN) 0.5 MG tablet 1 or 2 for sleep as needed. 1/2 hour before bedtime.  60 tablet  5  . Dexlansoprazole (DEXILANT) 60 MG capsule Take 60 mg by mouth every morning. Take 15 minutes before breakfast.      . dipyridamole-aspirin (AGGRENOX) 25-200 MG per 12 hr capsule Take 1 capsule by mouth 2 (two) times daily.  62  each  11  . exenatide (BYETTA 10 MCG PEN) 10 MCG/0.04ML SOLN Inject 0.04 mLs (10 mcg total) into the skin 2 (two) times daily with a meal.  2.4 mL  11  . Fluticasone-Salmeterol (ADVAIR DISKUS) 250-50 MCG/DOSE AEPB Inhale 1 puff into the lungs 2 (two) times daily.  60 each  11  . glucose blood (ACCU-CHEK AVIVA PLUS) test strip 1 each by Other route as needed. Use as to check blood sugar up to 3 times daily as instructed DX code:250.00      . HYDROcodone-acetaminophen (NORCO) 5-325 MG per tablet Take one and one-half tablets four times a day as needed for pain.  180 tablet  3  . insulin glargine (LANTUS SOLOSTAR) 100 UNIT/ML injection Inject 35 Units into the skin daily.  15 mL  6  . Insulin Pen Needle (PEN NEEDLES 31GX5/16") 31G X 8 MM MISC Use as directed for Insulin and Byetta injections. Dx Code 250.00  200 each  12  . Lancets Misc. (ACCU-CHEK FASTCLIX LANCET) KIT by Does not apply route. Use as to check blood sugar up to 3 times daily as instructed DX code:250.00       . metFORMIN (GLUCOPHAGE) 850 MG tablet Take 1 tablet (850 mg total) by mouth 3 (three) times daily.  90 tablet  6  . mometasone (NASONEX) 50 MCG/ACT nasal spray 2 sprays by Nasal route daily.       . multivitamin (THERAGRAN) per tablet Take 1 tablet by mouth daily.        .  rosuvastatin (CRESTOR) 5 MG tablet Take 1 tablet (5 mg total) by mouth daily.  30 tablet  6  . sitaGLIPtin (JANUVIA) 50 MG tablet Take 1 tablet (50 mg total) by mouth daily.  30 tablet  6  . tiotropium (SPIRIVA) 18 MCG inhalation capsule Place 1 capsule (18 mcg total) into inhaler and inhale daily.  30 capsule  11  . traMADol (ULTRAM) 50 MG tablet Take 1 tablet (50 mg total) by mouth every 6 (six) hours as needed for pain (cough).  20 tablet  0  . valsartan (DIOVAN) 80 MG tablet Take two tablets (160mg ) by mouth in the morning and one tablet (80mg ) by mouth at night each day  90 tablet  11  . albuterol (PROAIR HFA) 108 (90 BASE) MCG/ACT inhaler Inhale 2 puffs into the lungs 4 (four) times daily as needed.  1 Inhaler  11  . furosemide (LASIX) 20 MG tablet Take 1 tablet (20 mg total) by mouth daily.  30 tablet  3    OBJECTIVE: Middle-aged African American woman in no acute distress Filed Vitals:   06/28/11 0929  BP: 141/65  Pulse: 70  Temp: 97.6 F (36.4 C)     Body mass index is 47.48 kg/(m^2).    ECOG FS: 2  Sclerae unicteric Oropharynx clear No peripheral adenopathy Lungs no rales or rhonchi, fair expiration bilaterally Heart regular rate and rhythm, 2/6 systolic murmur noted Abd obese, benign MSK no focal spinal tenderness, no peripheral edema Neuro: nonfocal Breasts: Right breast status post lumpectomy; no evidence of local recurrence. There is a pacemaker in the upper anterior chest. Left breast also status post lumpectomy. No evidence of local recurrence.  LAB RESULTS: Hb A1c 8.7  Lab Results  Component Value Date   WBC 5.1 10/08/2010   NEUTROABS 3.0 10/08/2010   HGB 11.8* 10/08/2010   HCT 37.9 10/08/2010   MCV 83.5 10/08/2010   PLT 230 10/08/2010  Chemistry      Component Value Date/Time   NA 136 04/07/2011 1240   K 4.6 04/07/2011 1240   CL 101 04/07/2011 1240   CO2 21 04/07/2011 1240   BUN 14 04/07/2011 1240   CREATININE 0.82 04/07/2011 1240   CREATININE 1.24*  06/24/2010 0938      Component Value Date/Time   CALCIUM 9.4 04/07/2011 1240   ALKPHOS 64 04/07/2011 1240   AST 25 04/07/2011 1240   ALT 22 04/07/2011 1240   BILITOT 0.2* 04/07/2011 1240       Lab Results  Component Value Date   LABCA2 18 06/26/2007    No results found for this basename: INR:1;PROTIME:1 in the last 168 hours  No results found for this basename: UACOL:1,UAPR:1,USPG:1,UPH:1,UTP:1,UGL:1,UKET:1,UBIL:1,UHGB:1,UNIT:1,UROB:1,ULEU:1,UEPI:1,UWBC:1,URBC:1,UBAC:1,CAST:1,CRYS:1,UCOM:1,BILUA:1 in the last 72 hours   STUDIES: No results found.  ASSESSMENT: A 66 year old [place] woman status post -  1. Left lumpectomy and axillary lymph node dissection December 1992 for a T1N0 medullary breast cancer treated with tamoxifen for 2 years. 2. Status post right lumpectomy and axillary lymph node dissection August 1995 for a T1N0 microinvasive breast cancer treated with Arimidex for 5 years. 3. Status post right lower lobe lobectomy February 2003 for a 5 mm lung carcinoid.   PLAN: From my point of view she is doing well and we will continue to see her on a yearly basis. She will have her next mammogram in September. I am gating a chest x-ray now and again just before her next visit here a year from now.  I have advised her that her hemoglobin A1c is quite elevated, and she tells me she has an appointment with Dr. Meredith Pel within the next few weeks. She knows to call for any problems that may develop before the next visit.   Carmyn Hamm C    06/28/2011

## 2011-07-21 ENCOUNTER — Ambulatory Visit: Payer: Medicare Other | Admitting: Internal Medicine

## 2011-07-28 ENCOUNTER — Ambulatory Visit (INDEPENDENT_AMBULATORY_CARE_PROVIDER_SITE_OTHER): Payer: Medicare Other | Admitting: Internal Medicine

## 2011-07-28 ENCOUNTER — Encounter: Payer: Self-pay | Admitting: Internal Medicine

## 2011-07-28 VITALS — BP 132/62 | HR 92 | Ht 64.0 in | Wt 278.6 lb

## 2011-07-28 DIAGNOSIS — J449 Chronic obstructive pulmonary disease, unspecified: Secondary | ICD-10-CM

## 2011-07-28 DIAGNOSIS — Z23 Encounter for immunization: Secondary | ICD-10-CM

## 2011-07-28 DIAGNOSIS — G4733 Obstructive sleep apnea (adult) (pediatric): Secondary | ICD-10-CM

## 2011-07-28 MED ORDER — TRAMADOL HCL 50 MG PO TABS: 50.0000 mg | ORAL_TABLET | Freq: Four times a day (QID) | ORAL | Status: DC | PRN

## 2011-07-28 NOTE — Patient Instructions (Signed)
Refill for Tramadol sent to try for cough.   See if it works better than the benzonatate pearls that you were using.   Order- Pneumovax

## 2011-07-28 NOTE — Progress Notes (Signed)
Patient ID: Melissa Villa, female    DOB: 1946-01-08, 66 y.o.   MRN: [id number]  HPI 10/12/10- 10 yoF former smoker seen at kind request of Dr Meredith Pel for sleep evaluation. She complains of difficulty maintaining sleep, waking 4-5 x/ night. Has had 2 sleep studies positive for OSA. Sleeps every night with CPAP, but can't maintain sleep and fights daytime somnolence.  CPAP keeps her awake, but cna't fall asleep without it. The mask cuts her nose and leaks. Has tried Ambien, Rozerem and others. Lives alone and unsure about snoring or leg movement.  Has had a significant cough with a cold but feeling better. R lobectomy 2003 for ? Cancer vs benign nodule. Lumpectomy breast for cancer.  11/12/10- 56 yoF former smoker followed for OSA, restrictive lung disease/ hx R lobectomy/ Carcinoid tumor, complicated by hx breast Ca She met with the [facility] staff, with recommendation that she have a new titration for pressure adjustment. She is willing "because I want some rest". New full face mask fits better.   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 Key was that her O2 sat wouldn't stay up even on 1 L/M, recording 73 minutes less than 88%. I explained this may be why she feels unable to sleep well.  She had restrictive lung disease on pulmonary function testing 08/05/2010 which may reflect her lobectomy as well as radiation therapy for her breast cancer and her obesity.  02/03/11-  38 yoF former smoker followed for OSA, restrictive lung disease/ hx R lobectomy/ Carcinoid tumor, complicated by hx breast Ca She is using CPAP at 11 CWP plus oxygen at 2 L/Advanced all night every night. Insomnia remains a problem, she doesn't sleep soundly through the night and is sleepy without naps in the daytime. Temazepam 15 mg is not enough. She takes 2 hours to fall asleep and then if she  wakes to the bathroom she again needs a long time before she can return to sleep.  03/25/11-   6 yoF former smoker followed for OSA, restrictive lung disease/ hx R lobectomy/ Carcinoid tumor, complicated by hx breast Ca Acute visit- 2 weeks ago developed pruritic burning rash on the left ribs. We called in valacyclovirand a prednisone taper based on her verbal description and she comes is requested to let us see the rash. 2 small children weather today, neither of whom has had chickenpox  05/05/11- 65 yoF former smoker followed for OSA, restrictive lung disease/ hx R lobectomy/ Carcinoid tumor, complicated by hx breast Ca  Benzonatate has been a big help but she still coughs some- dry.  CXR 06/22/2010 showed normal heart mild vascular congestion and pacemaker with surgical clips in both axillae.  With clonazepam she has less waking.  She wants to know why she is waking up and we talked about her sleep apnea status that she continues CPAP. We also discussed whether Advair at night would be keeping her awake.  07/28/11-  3 yoF former smoker followed for OSA, restrictive lung disease/ hx R lobectomy/ Carcinoid tumor, complicated by hx breast Ca. Still having increased SOB with walking and gives out quickly(even making bed or washing dishes).Also gets headaches with it. Stays tired, not relieved by sleep. Feels "busy brain" although she creams. Not taking clonazepam. Does use CPAP with oxygen every night. Insurance won't pay for benzonatate. Still uses Spiriva, Advair, rescue inhaler.  Off of amitriptyline. GERD is under much better control with Paxil and she burps a lot. Chest x-ray 06/28/2011-no active disease. Left pacemaker. Postoperative changes. IMPRESSION:  1. Stable postop change involving the right hemithorax.  Original Report Authenticated By: Rosealee Albee, M.D.   Review of Systems- see HPI Constitutional:   No weight loss, night sweats,  Fevers, chills, fatigue, lassitude. HEENT:   No  headaches,  Difficulty swallowing,  Tooth/dental problems,  Sore throat,                No sneezing, itching, ear ache, nasal congestion, post nasal drip,  CV:  No chest pain,  Orthopnea, PND, swelling in lower extremities, anasarca, dizziness, palpitations GI  No heartburn, indigestion, abdominal pain, nausea, vomiting, diarrhea, change in bowel habits, loss of appetite Resp: +Shortness of breath with exertion, not at rest.   Non-productive cough,  No coughing up of blood.  No change in color of mucus.  No wheezing.  Skin: . No rash GU:  MS: Psych:  No change in mood or affect. No depression or anxiety.  No memory loss.    Objective:   Physical Exam General- Alert, Oriented, Affect-appropriate, Distress- none acute, obese, O2 sat 96%   Skin-crusting lesions in dermatome pattern mid left side of chest quite consistent with shingles. Lymphadenopathy- none Head- atraumatic Eyes- Gross vision intact, PERRLA, conjunctivae clear secretions Ears- Hearing, canals, Tm normal Nose- Clear, No- Septal dev, mucus, polyps, erosion, perforation   Scar bridge of nose Throat- Mallampati II , mucosa clear , drainage- none, tonsils- atrophic Neck- flexible , trachea midline, no stridor , thyroid nl, carotid no bruit Chest - symmetrical excursion , unlabored    Right pacemaker Heart/CV- RRR , 1/6 systolic murmur right upper sternal area , no gallop  , no rub, nl s1  - JVD- none , edema- none, stasis changes- none, varices- none  Lung- clear to P&A, bronchitis cough ; wheeze- none,  , dullness-none, rub- none    Chest wall- R pacemaker Abd-  Br/ Gen/ Rectal- Not done, not indicated Extrem-

## 2011-08-01 NOTE — Assessment & Plan Note (Addendum)
Good compliance and control with CPAP/oxygen. She is complaining of residual tiredness and a sense of on restorative sleep. We need to watch out for effect of sedating medications during the daytime.

## 2011-08-01 NOTE — Assessment & Plan Note (Signed)
We discussed how GERD and tramadol might impact her cough. Emphasized reflux precautions. Pneumonia vaccine.

## 2011-08-10 ENCOUNTER — Ambulatory Visit (INDEPENDENT_AMBULATORY_CARE_PROVIDER_SITE_OTHER): Payer: Medicare Other | Admitting: Family Medicine

## 2011-08-10 VITALS — BP 149/81

## 2011-08-10 DIAGNOSIS — G5690 Unspecified mononeuropathy of unspecified upper limb: Secondary | ICD-10-CM

## 2011-08-10 DIAGNOSIS — M7711 Lateral epicondylitis, right elbow: Secondary | ICD-10-CM

## 2011-08-10 DIAGNOSIS — G609 Hereditary and idiopathic neuropathy, unspecified: Secondary | ICD-10-CM

## 2011-08-10 DIAGNOSIS — M7710 Lateral epicondylitis, unspecified elbow: Secondary | ICD-10-CM

## 2011-08-10 MED ORDER — PREGABALIN 75 MG PO CAPS: 75.0000 mg | ORAL_CAPSULE | Freq: Every day | ORAL | Status: DC

## 2011-08-10 NOTE — Progress Notes (Signed)
Patient Name: Melissa Villa Date of Birth: 08-15-1945 Age: 66 y.o. Medical Record Number: [id number] Gender: female Date of Encounter: 08/10/2011  History of Present Illness:  Melissa Villa is a 66 y.o. very pleasant female patient who presents with the following:  Patient presents with lateral elbow pain.  Length of symptoms: 3-6 months Hand effected: R  Patient describes a dull ache on the lateral elbow. There is some translation in the proximal forearm and in the distal upper arm. It is painful to lift with the hand facing down and to lift with the thumb in an upright position. Supination is painful. Patient points to the lateral epicondyle as the point of maximal tenderness near ECRB.  No trauma.   No prior fractures or operative interventions in the effective hand. Prior PT or HEP: none  Denies numbness or tingling. No significant neck or shoulder pain.  Also with diffuse burning, tingling, itching sensation B hands without neck pain. Not provocable. Dx of neuropathy in problem list and history of DM for 20 years.   Past Medical History, Surgical History, Social History, Family History, Problem List, Medications, and Allergies have been reviewed and updated if relevant.  Review of Systems:  GEN: No fevers, chills. Nontoxic. Primarily MSK c/o today. MSK: Detailed in the HPI GI: tolerating PO intake without difficulty Neuro: detailed above Otherwise the pertinent positives of the ROS are noted above.    Physical Examination: Filed Vitals:   08/10/11 1527  BP: 149/81    There is no height or weight on file to calculate BMI.   GEN: WDWN, NAD, Non-toxic, Alert & Oriented x 3 HEENT: Atraumatic, Normocephalic.  Ears and Nose: No external deformity. EXTR: No clubbing/cyanosis/edema NEURO: Normal gait.  PSYCH: Normally interactive. Conversant. Not depressed or anxious appearing.  Calm demeanor.   Elbow: R Ecchymosis or edema: neg ROM: full flexion, extension,  pronation, supination Shoulder ROM: Full Flexion: 5/5 Extension: 5/5, PAINFUL Supination: 5/5, PAINFUL Pronation: 5/5 Wrist ext: 5/5 Wrist flexion: 5/5 No gross bony abnormality Varus and Valgus stress: stable ECRB tenderness: YES, TTP Medial epicondyle: NT Lateral epicondyle, resisted wrist extension from wrist full pronation and flexion: PAINFUL grip: 5/5  sensation intact Tinel's, Elbow: negative    Assessment and Plan:  1. Neuropathy, arm  pregabalin (LYRICA) 75 MG capsule  2. PERIPHERAL NEUROPATHY    3. Lateral epicondylitis of right elbow     Pt. given a formal rehab program on elbow rehabiliation.   Series of concentric and eccentric exercises should be done starting with no weight, work up to 1 lb, hammer, etc. Formal PT would be beneficial. Emphasized stretching an cross-friction massage Emphasized proper palms up lifting biomechanics to unload ECRB  Neuropathy - longstanding, unclear if from DM. Doubt spinal origin. Will not change POC. Start Lyrica 75 mg qhs  Lateral Epicondylitis Injection, R Verbal consent was obtained from the patient. Risks, benefits, and alternatives were discussed. Potential complications including loss of pigment, atrophy, and rare risk of infection were discussed. Prepped with Chloraprep and Ethyl Chloride used for anesthesia. Under sterile conditions, the patient was injected at the point of maximal tenderness at the ECRB tendon with 2 cc of Lidocaine 1% and 1 cc of Depo-Medrol 40 mg. Decreased pain after injection. No complications.  Needle size: 22 gauge 1 1/2 inch   Orders Today: No orders of the defined types were placed in this encounter.    Medications Today: Meds ordered this encounter  Medications  . pregabalin (LYRICA) 75 MG capsule  Sig: Take 1 capsule (75 mg total) by mouth at bedtime.    Dispense:  30 capsule    Refill:  2

## 2011-08-12 ENCOUNTER — Other Ambulatory Visit: Payer: Self-pay | Admitting: *Deleted

## 2011-08-12 ENCOUNTER — Other Ambulatory Visit: Payer: Self-pay | Admitting: Internal Medicine

## 2011-08-12 ENCOUNTER — Ambulatory Visit (INDEPENDENT_AMBULATORY_CARE_PROVIDER_SITE_OTHER): Payer: Medicare Other | Admitting: *Deleted

## 2011-08-12 ENCOUNTER — Encounter: Payer: Self-pay | Admitting: Internal Medicine

## 2011-08-12 DIAGNOSIS — I442 Atrioventricular block, complete: Secondary | ICD-10-CM

## 2011-08-12 MED ORDER — PREGABALIN 75 MG PO CAPS: 75.0000 mg | ORAL_CAPSULE | Freq: Every day | ORAL | Status: DC

## 2011-08-12 MED ORDER — TRAMADOL HCL 50 MG PO TABS: 50.0000 mg | ORAL_TABLET | Freq: Four times a day (QID) | ORAL | Status: DC | PRN

## 2011-08-13 LAB — REMOTE PACEMAKER DEVICE
AL AMPLITUDE: 2.6 mv
ATRIAL PACING PM: 16
BAMS-0001: 150 {beats}/min
BAMS-0003: 60 {beats}/min
BATTERY VOLTAGE: 2.95 V
RV LEAD THRESHOLD: 1.125 V
VENTRICULAR PACING PM: 1

## 2011-08-16 ENCOUNTER — Other Ambulatory Visit: Payer: Self-pay | Admitting: Internal Medicine

## 2011-08-16 DIAGNOSIS — M79606 Pain in leg, unspecified: Secondary | ICD-10-CM

## 2011-08-16 DIAGNOSIS — M25519 Pain in unspecified shoulder: Secondary | ICD-10-CM

## 2011-08-16 MED ORDER — TIOTROPIUM BROMIDE MONOHYDRATE 18 MCG IN CAPS: 18.0000 ug | ORAL_CAPSULE | Freq: Every day | RESPIRATORY_TRACT | Status: AC

## 2011-08-16 MED ORDER — AMLODIPINE BESYLATE 10 MG PO TABS: 10.0000 mg | ORAL_TABLET | Freq: Every day | ORAL | Status: DC

## 2011-08-16 MED ORDER — HYDROCODONE-ACETAMINOPHEN 5-325 MG PO TABS: ORAL_TABLET | ORAL | Status: DC

## 2011-08-16 NOTE — Telephone Encounter (Signed)
Hydrocodone 5/325mg  rx called to [facility].

## 2011-08-16 NOTE — Telephone Encounter (Signed)
Refill approved - nurse to complete. 

## 2011-08-17 NOTE — Progress Notes (Signed)
Remote pacer check  

## 2011-08-25 ENCOUNTER — Ambulatory Visit (INDEPENDENT_AMBULATORY_CARE_PROVIDER_SITE_OTHER): Payer: Medicare Other | Admitting: Internal Medicine

## 2011-08-25 ENCOUNTER — Encounter: Payer: Self-pay | Admitting: Internal Medicine

## 2011-08-25 VITALS — BP 149/73 | HR 78 | Temp 98.5°F | Wt 275.1 lb

## 2011-08-25 DIAGNOSIS — G4700 Insomnia, unspecified: Secondary | ICD-10-CM

## 2011-08-25 DIAGNOSIS — I10 Essential (primary) hypertension: Secondary | ICD-10-CM

## 2011-08-25 DIAGNOSIS — E119 Type 2 diabetes mellitus without complications: Secondary | ICD-10-CM

## 2011-08-25 LAB — COMPLETE METABOLIC PANEL WITH GFR
ALT: 17 U/L (ref 0–35)
CO2: 24 mEq/L (ref 19–32)
Calcium: 9.3 mg/dL (ref 8.4–10.5)
Chloride: 107 mEq/L (ref 96–112)
Creat: 0.8 mg/dL (ref 0.50–1.10)
GFR, Est African American: >89 mL/min
GFR, Est Non African American: 79 mL/min
Glucose, Bld: 84 mg/dL (ref 70–99)
Sodium: 143 mEq/L (ref 135–145)
Total Bilirubin: 0.2 mg/dL — ABNORMAL LOW (ref 0.3–1.2)
Total Protein: 7.8 g/dL (ref 6.0–8.3)

## 2011-08-25 LAB — POCT GLYCOSYLATED HEMOGLOBIN (HGB A1C): Hemoglobin A1C: 6.8

## 2011-08-25 LAB — GLUCOSE, CAPILLARY: Glucose-Capillary: 81 mg/dL (ref 70–99)

## 2011-08-25 MED ORDER — VALSARTAN 80 MG PO TABS: 160.0000 mg | ORAL_TABLET | Freq: Two times a day (BID) | ORAL | Status: DC

## 2011-08-25 MED ORDER — BENZONATATE 100 MG PO CAPS: 100.0000 mg | ORAL_CAPSULE | Freq: Three times a day (TID) | ORAL | Status: DC | PRN

## 2011-08-25 MED ORDER — SITAGLIPTIN PHOSPHATE 50 MG PO TABS: 100.0000 mg | ORAL_TABLET | Freq: Every day | ORAL | Status: DC

## 2011-08-25 NOTE — Assessment & Plan Note (Signed)
Lab Results  Component Value Date   HGBA1C 6.8 08/25/2011    Assessment: Diabetes control: controlled Progress toward goals: at goal Barriers to meeting goals: no barriers identified  Plan: Patient would like to stop Byetta, and given her concerns about possible side effects and also given that we had been unable to reduce her insulin requirement or effect weight loss, I think it is reasonable to do so.  The plan is to stop Byetta; continue Lantus insulin 35 units daily; continue metformin at current dose; increase Januvia to 100 mg daily.  I advised the patient to call if she has any problem with high or low blood sugars.

## 2011-08-25 NOTE — Progress Notes (Signed)
  Subjective:    Patient ID: Melissa Villa, female    DOB: 02-16-1946, 66 y.o.   MRN: [id number]  HPI Patient presents for followup of her diabetes mellitus, hypertension, and other chronic medical problems.  She has no acute complaints today.  She reports that she was started on Lyrica by her sports medicine clinic physician, and that her insomnia has markedly improved.  She is concerned about potential side effects of Byetta, and she would like to stop Byetta especially given the fact that we were not successful in lowering her dose of Lantus insulin and she has not been successful in losing weight on Byetta.  Her pulmonary physician Dr. Maple Hudson added tramadol for her chronic cough and she reports improvement in her cough with the addition of tramadol to her Tessalon capsules.  Her blood sugars are better controlled following the last increase in her Lantus insulin.  She reports that she is compliant with her medications.  She did not bring her medications to clinic today, so the medication reconciliation was done by her report.   Review of Systems See history of present illness    Objective:   Physical Exam  Constitutional: No distress.  Cardiovascular: Normal rate and regular rhythm.  Exam reveals no gallop and no friction rub.   No murmur heard. Pulmonary/Chest: Effort normal and breath sounds normal. No respiratory distress. She has no wheezes. She has no rales.  Abdominal: Soft. Bowel sounds are normal. There is no tenderness.  Musculoskeletal: She exhibits no edema.        Assessment & Plan:

## 2011-08-25 NOTE — Assessment & Plan Note (Signed)
Assessment: Improved since Lyrica was added to patient's pain regimen.  Plan: Continue current regimen.

## 2011-08-25 NOTE — Patient Instructions (Signed)
Stop Byetta. Increase Januvia 50 MG to a dose of 2 tablets once daily. Increase valsartan (Diovan) 80 mg to a dose of 2 tablets tice a day. Call if your blood sugars are increasing or decreasing.

## 2011-08-25 NOTE — Assessment & Plan Note (Signed)
Lab Results  Component Value Date   NA 140 06/28/2011   K 4.5 06/28/2011   CL 105 06/28/2011   CO2 24 06/28/2011   BUN 16 06/28/2011   CREATININE 0.83 06/28/2011    BP Readings from Last 3 Encounters:  08/25/11 149/73  08/10/11 149/81  07/28/11 132/62    Assessment: Hypertension control:  mildly elevated  Progress toward goals:  improved Barriers to meeting goals:  no barriers identified  Plan: Increase valsartan to a dose of 160 mg twice a day; continue amlodipine and carvedilol at current dose; check a metabolic panel today.

## 2011-09-03 ENCOUNTER — Encounter: Payer: Self-pay | Admitting: *Deleted

## 2011-09-14 ENCOUNTER — Other Ambulatory Visit: Payer: Self-pay | Admitting: Internal Medicine

## 2011-09-21 ENCOUNTER — Encounter: Payer: Self-pay | Admitting: Family Medicine

## 2011-09-21 ENCOUNTER — Ambulatory Visit (INDEPENDENT_AMBULATORY_CARE_PROVIDER_SITE_OTHER): Payer: Medicare Other | Admitting: Family Medicine

## 2011-09-21 VITALS — BP 173/79

## 2011-09-21 DIAGNOSIS — G5690 Unspecified mononeuropathy of unspecified upper limb: Secondary | ICD-10-CM

## 2011-09-21 MED ORDER — PREGABALIN 100 MG PO CAPS: 100.0000 mg | ORAL_CAPSULE | Freq: Two times a day (BID) | ORAL | Status: DC

## 2011-09-21 NOTE — Progress Notes (Signed)
[facility]  Patient Name: Melissa Villa Date of Birth: [birth year] Medical Record Number: [id number] Gender: female Date of Encounter: 09/21/2011  History of Present Illness:  Melissa Villa is a 66 y.o. very pleasant female patient who presents with the following:  Pleasant patient f/u R LE with B neuropathic symptoms  LE continues without much improvement, injection at last OV. Helped for only a short time  B tingling improved somewhat mildly with Lyrica. No neck pain, sometimes worse at night  Has been to PT, not doing any home rehab now  Patient Active Problem List  Diagnoses  . CARCINOID TUMOR, LUNG  . DIABETES MELLITUS, TYPE II  . HYPERCHOLESTEROLEMIA  . ANEMIA-IRON DEFICIENCY  . OBSTRUCTIVE SLEEP APNEA  . CHRONIC POSTTHORACOTOMY PAIN  . PERIPHERAL NEUROPATHY  . RETINOPATHY, DIABETIC, PROLIFERATIVE  . HEMORRHAGE, VITREOUS  . HYPERTENSION  . AV BLOCK, COMPLETE  . CONGESTIVE HEART FAILURE  . INTERMITTENT CLAUDICATION, BILATERAL  . SINUSITIS, CHRONIC  . ALLERGIC RHINITIS, SEASONAL  . COPD  . GERD  . DEGENERATIVE JOINT DISEASE  . ROTATOR CUFF SYNDROME, RIGHT  . Lower extremity pain  . OSTEOPENIA  . INSOMNIA  . DYSPNEA ON EXERTION  . RIB PAIN, RIGHT SIDED  . URINARY INCONTINENCE  . MICROALBUMINURIA  . COLONIC POLYPS, ADENOMATOUS, HX OF  . Restrictive lung disease  . Pacemaker  . Shingles rash   Past Medical History  Diagnosis Date  . CHF (congestive heart failure)      2-D echo 02/19/2009 showed left ventricle cavity size normal; systolic function normal, estimated ejection fraction 55%; wall motion normal; no regional wall motion abnormalities; PA peak pressure 47mm Hg.  [name] COPD (chronic obstructive pulmonary disease)     Pulmonary function tests on 08/05/2010 showed mixed obstructive and restrictive lung disease with no significant response to bronchodilator, and decreased diffusion capacity that corrects to normal range when adjusted for the  inhaled alveolar volume.  . Hypertension   . TIA (transient ischemic attack)   . Anemia   . 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  Patient is followed by Dr. [name] at the cancer center.  . Degenerative joint disease   . AV block, complete     S/P dual-chamber for symptomatic episodes of bradycardia and complete heart block.  . Pacemaker     S/P dual-chamber for symptomatic episodes of bradycardia and complete heart block.  . Adenomatous polyp of colon     Tubular adenomas X 2 found 06/1999; negative screening colonoscopy 02/13/2004 by Dr. [name].  . Hypercholesteremia   . Obstructive sleep apnea     Baseline diagnostic nocturnal polysomnogram on July 27, 2005 showed severe obstructive sleep apnea/hypopnea syndrome, with AHI 153.2 per hour.  Last nocturnal polysomnogram done for CPAP titration was 05/01/2009; CPAP was titrated to 13 CWP, AHI 1.8 per hour using a large Respironics FitLife Full Face Mask with heated humidifier.  . Peripheral autonomic neuropathy due to diabetes mellitus   . 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 [name]  . Constipation   . Hand pain   . Palpitation   . Rib pain     right sided   .  DM type 2 (diabetes mellitus, type 2)   . Skin rash   . Urinary incontinence   . Proliferative diabetic retinopathy     S/P panretinal photocoagulation by Dr. [name]  . Vitreous hemorrhage     With tractional detachment, left eye; S/P posterior vitrectomy with membrane peel by Dr. [name] 04/06/2005  . Dyspnea on exertion     Cardiac cath 06/17/2005 by Dr. [name] showed LVEF 50-55%, 30% proximal LAD stenosis, small diagonals.   .  Shingles rash 03/25/11   Past Surgical History  Procedure Date  . Mastectomy partial / lumpectomy w/ axillary lymphadenectomy Oct 15, 1990, Oct 14, 1993    S/P left lumpectomy and axillary lymph node dissection December 1992 for a T1 N0 medullary breast cancer, no lymph node involvement, treated with tamoxifen for 2 years; S/P right lumpectomy and axillary lymph node dissection August 1995 for a T1 N0 microinvasive breast cancer, treated with Arimidex for 5 years.  Patient is followed by Dr. [name] at the cancer center.  . Lung lobectomy 06/13/2001    S/P right lower lobe lobectomy 06/13/2001 by Dr. [name]  . Pacemaker insertion 04/20/2001  . Cholecystectomy   . Tonsillectomy    History  Substance Use Topics  . Smoking status: Former Smoker -- 1.0 packs/day for 15 years    Types: Cigarettes    Quit date: 04/19/2001  . Smokeless tobacco: Never Used  . Alcohol Use: No   Family History  Problem Relation Age of Onset  . Breast cancer Mother 87    Deceased 15-Oct-2002  . Diabetes type II Mother   . Diabetes type II Brother   . Hypertension Brother   . Heart attack Brother   . Stroke Brother   . Hypertension Brother   . Hypertension Sister   . Cervical cancer Neg Hx   . Colon cancer Neg Hx    No Known Allergies  Medication list has been reviewed and updated.  Prior to Admission medications   Medication Sig Start Date End Date Taking? Authorizing Provider  albuterol (PROAIR HFA) 108 (90 BASE) MCG/ACT inhaler Inhale 2 puffs into the lungs 4 (four) times daily as needed for wheezing or shortness of breath. 09/14/11   [name], MD  amLODipine (NORVASC) 10 MG tablet Take 1 tablet (10 mg total) by mouth daily. 08/16/11 08/16/12  [name], MD  benzonatate (TESSALON) 100 MG capsule Take 1-2 capsules (100-200 mg total) by mouth 3 (three) times daily as needed for cough. 08/25/11   [name], MD  Calcium Carbonate-Vitamin D (CALCIUM 600+D) 600-400 MG-UNIT per tablet Take 1 tablet by  mouth 2 (two) times daily.      Historical Provider, MD  carvedilol (COREG) 12.5 MG tablet Take 2 tablets (25 mg total) by mouth 2 (two) times daily. 06/09/11   [name], MD  cetirizine (ZYRTEC) 10 MG tablet Take 1 tablet (10 mg total) by mouth daily. For seasonal allergies. 01/18/11   [name], MD  Dexlansoprazole (DEXILANT) 60 MG capsule Take 60 mg by mouth every morning. Take 15 minutes before breakfast.    Historical Provider, MD  dipyridamole-aspirin (AGGRENOX) 25-200 MG per 12 hr capsule Take 1 capsule by mouth 2 (two) times daily. 03/15/11   [name], MD  Fluticasone-Salmeterol (ADVAIR DISKUS) 250-50 MCG/DOSE AEPB Inhale 1 puff into the lungs 2 (two) times daily. 04/15/11   [name], MD  furosemide (LASIX) 20 MG tablet Take 1 tablet (20 mg total) by mouth daily. 06/10/10  06/10/11  [name], MD  glucose blood (ACCU-CHEK AVIVA PLUS) test strip 1 each by Other route as needed. Use as to check blood sugar up to 3 times daily as instructed DX code:250.00    Historical Provider, MD  HYDROcodone-acetaminophen (NORCO) 5-325 MG per tablet Take one and one-half tablets four times a day as needed for pain. 08/16/11   [name], MD  insulin glargine (LANTUS SOLOSTAR) 100 UNIT/ML injection Inject 35 Units into the skin daily. 05/12/11   Danley Danker, MD  Insulin Pen Needle (PEN NEEDLES 31GX5/16") 31G X 8 MM MISC Use as directed for Insulin and Byetta injections. Dx Code 250.00 06/24/11   [name], MD  Lancets Misc. (ACCU-CHEK FASTCLIX LANCET) KIT by Does not apply route. Use as to check blood sugar up to 3 times daily as instructed DX code:250.00     Historical Provider, MD  metFORMIN (GLUCOPHAGE) 850 MG tablet Take 1 tablet (850 mg total) by mouth 3 (three) times daily. 06/16/11   [name], MD  multivitamin [facility]) per tablet Take 1 tablet by mouth daily.      Historical Provider, MD  NASONEX 50 MCG/ACT nasal spray USE 2 SPRAYS IN  EACH NOSTRIL ONCE A DAY 08/16/11   [name], MD  pregabalin (LYRICA) 100 MG capsule Take 1 capsule (100 mg total) by mouth 2 (two) times daily. 09/21/11 09/20/12  Hannah Beat, MD  rosuvastatin (CRESTOR) 5 MG tablet Take 1 tablet (5 mg total) by mouth daily. 06/16/11   [name], MD  sitaGLIPtin (JANUVIA) 50 MG tablet Take 2 tablets (100 mg total) by mouth daily. 08/25/11 08/24/12  [name], MD  tiotropium (SPIRIVA) 18 MCG inhalation capsule Place 1 capsule (18 mcg total) into inhaler and inhale daily. 08/16/11   [name], MD  traMADol (ULTRAM) 50 MG tablet Take 1 tablet (50 mg total) by mouth every 6 (six) hours as needed for pain (cough). 08/12/11 08/11/12  Waymon Budge, MD  valsartan (DIOVAN) 80 MG tablet Take 2 tablets (160 mg total) by mouth 2 (two) times daily. 08/25/11   [name], MD    Review of Systems:  . GEN: No fevers, chills. Nontoxic. Primarily MSK c/o today. MSK: Detailed in the HPI GI: tolerating PO intake without difficulty Neuro: detailed above Otherwise the pertinent positives of the ROS are noted above.    Physical Examination: Filed Vitals:   09/21/11 1336  BP: 173/79   There were no vitals filed for this visit. There is no height or weight on file to calculate BMI.   GEN: WDWN, NAD, Non-toxic, Alert & Oriented x 3 HEENT: Atraumatic, Normocephalic.  Ears and Nose: No external deformity. EXTR: No clubbing/cyanosis/edema NEURO: Normal gait.  PSYCH: Normally interactive. Conversant. Not depressed or anxious appearing.  Calm demeanor.   Neck ROM full, neg spurlings  Elbow: R Ecchymosis or edema: neg ROM: full flexion, extension, pronation, supination Shoulder ROM: Full Flexion: 5/5 Extension: 5/5, PAINFUL Supination: 5/5, PAINFUL Pronation: 5/5 Wrist ext: 5/5 Wrist flexion: 5/5 No gross bony abnormality Varus and Valgus stress: stable ECRB tenderness: YES, TTP Medial epicondyle: NT Lateral epicondyle, resisted wrist  extension from wrist full pronation and flexion: PAINFUL grip: 5/5  sensation intact Tinel's, Elbow: negative    Assessment and Plan:  1. Neuropathy, arm  pregabalin (LYRICA) 100 MG capsule   Titrate up lyrica, suspect component from neck and possibly dm neuropathy  R LE, not improving - cont to try  with home rehab Poor surgical candidate  Orders Today: No orders of the defined types were placed in this encounter.    Medications Today: Meds ordered this encounter  Medications  . pregabalin (LYRICA) 100 MG capsule    Sig: Take 1 capsule (100 mg total) by mouth 2 (two) times daily.    Dispense:  60 capsule    Refill:  2     Hannah Beat, MD 09/21/2011 1:54 PM

## 2011-09-21 NOTE — Patient Instructions (Signed)
Take the 75 mg tabs you have now, 1 in the morning and 1 at night -- on a day when you can see how sleepy it makes you.   Take it twice a day for 2 weeks  Then increase to 100 mg tablets  --- If taking during the day makes you too drowsy only take at night   Recheck with Dr. Margaretha Sheffield in 6 weeks

## 2011-09-29 ENCOUNTER — Ambulatory Visit (INDEPENDENT_AMBULATORY_CARE_PROVIDER_SITE_OTHER): Payer: Medicare Other | Admitting: Internal Medicine

## 2011-09-29 ENCOUNTER — Encounter: Payer: Self-pay | Admitting: Internal Medicine

## 2011-09-29 VITALS — BP 140/80 | HR 78 | Temp 97.8°F | Wt 283.1 lb

## 2011-09-29 DIAGNOSIS — E7800 Pure hypercholesterolemia, unspecified: Secondary | ICD-10-CM

## 2011-09-29 DIAGNOSIS — J4489 Other specified chronic obstructive pulmonary disease: Secondary | ICD-10-CM

## 2011-09-29 DIAGNOSIS — R059 Cough, unspecified: Secondary | ICD-10-CM

## 2011-09-29 DIAGNOSIS — I10 Essential (primary) hypertension: Secondary | ICD-10-CM

## 2011-09-29 DIAGNOSIS — J449 Chronic obstructive pulmonary disease, unspecified: Secondary | ICD-10-CM

## 2011-09-29 DIAGNOSIS — E119 Type 2 diabetes mellitus without complications: Secondary | ICD-10-CM

## 2011-09-29 DIAGNOSIS — R05 Cough: Secondary | ICD-10-CM

## 2011-09-29 MED ORDER — DEXTROMETHORPHAN HBR 15 MG/5ML PO SYRP: 5.0000 mL | ORAL_SOLUTION | Freq: Three times a day (TID) | ORAL | Status: AC | PRN

## 2011-09-29 NOTE — Assessment & Plan Note (Signed)
Assessment: Patient has chronic exertional cough, with incomplete relief on Tessalon and tramadol; she currently says that she cannot afford the Tessalon.  She is already on hydrocodone as chronic pain treatment.  Plan: I advised patient to check with more than one pharmacy to see if she can get a better price on the Tessalon.  The plan is to add dextromethorphan 15-30 mg 3 times a day as needed for cough.  I also advised her to talk with Dr. Maple Hudson to see if he has any recommendations.  I also discussed antireflux precautions, in case GERD is a contributing factor.

## 2011-09-29 NOTE — Assessment & Plan Note (Signed)
Hemoglobin A1C  Date Value Range Status  08/25/2011 6.8   Final  06/09/2011 8.7   Final  04/07/2011 10.7   Final     Assessment: Diabetes control: controlled Progress toward goals: at goal Barriers to meeting goals: no barriers identified  Plan: Diabetes treatment: continue current medications Refer to: none Instruction/counseling given: reminded to bring medications to each visit

## 2011-09-29 NOTE — Assessment & Plan Note (Signed)
Assessment: Other than her chronic cough as noted above, patient appears to be doing reasonably well on her current regimen of Spiriva, Advair, and albuterol inhalers.  Plan: Continue current inhaler regimen.

## 2011-09-29 NOTE — Assessment & Plan Note (Signed)
Lipids:    Component Value Date/Time   CHOL 155 10/08/2010 0849   TRIG 114 10/08/2010 0849   HDL 33* 10/08/2010 0849   LDLCALC 99 10/08/2010 0849   VLDL 23 10/08/2010 0849   CHOLHDL 4.7 10/08/2010 0849   Assessment: Patient is doing well on her current dose of rosuvastatin without apparent side effects.  Plan: Patient will return for a fasting lipid panel and metabolic panel within the next week.  Continue rosuvastatin 5 mg daily.

## 2011-09-29 NOTE — Progress Notes (Signed)
  Subjective:    Patient ID: Melissa Villa, female    DOB: Sep 04, 1945, 66 y.o.   MRN: [id number]  HPI Patient returns for followup of her diabetes mellitus, chronic cough, hypertension, and other medical problems.  Her main complaint today is persisting cough which occurs mainly with exertion.  The cough is nonproductive.  She reports some benefit from the [facility] but says that she has not been taking it recently because she cannot afford the co-pay for that medication.  She has been out of her Lasix for about 2 months, but does not feel that this has contributed to her cough.  Her blood sugars have been reasonably well controlled since we stopped the Byetta.   Review of Systems  Constitutional: Negative for fever and chills.  Respiratory: Negative for wheezing.   Cardiovascular: Negative for chest pain and leg swelling.       Objective:   Physical Exam  Constitutional: No distress.  Cardiovascular: Normal rate, regular rhythm and normal heart sounds.  Exam reveals no gallop and no friction rub.   No murmur heard. Pulmonary/Chest: Effort normal and breath sounds normal. No respiratory distress. She has no wheezes. She has no rales.  Abdominal: Soft. Bowel sounds are normal. She exhibits no distension. There is no tenderness. There is no guarding.  Musculoskeletal: She exhibits no edema.          Assessment & Plan:

## 2011-09-29 NOTE — Patient Instructions (Addendum)
1. Take dextromethorphan cough syrup 1-2 teaspoonfuls 3 times a day as needed for cough. 2.  Please return within one week for fasting blood work.

## 2011-09-29 NOTE — Assessment & Plan Note (Signed)
Lab Results  Component Value Date   NA 143 08/25/2011   K 4.2 08/25/2011   CL 107 08/25/2011   CO2 24 08/25/2011   BUN 13 08/25/2011   CREATININE 0.80 08/25/2011    BP Readings from Last 3 Encounters:  09/29/11 140/80  09/21/11 173/79  08/25/11 149/73    Assessment: Hypertension control:  controlled  Progress toward goals:  at goal Barriers to meeting goals:  no barriers identified  Plan: Continue current medications; will check a metabolic panel when she returns for fasting blood work within the next week.

## 2011-09-30 ENCOUNTER — Other Ambulatory Visit (INDEPENDENT_AMBULATORY_CARE_PROVIDER_SITE_OTHER): Payer: Medicare Other

## 2011-09-30 DIAGNOSIS — E7800 Pure hypercholesterolemia, unspecified: Secondary | ICD-10-CM

## 2011-09-30 DIAGNOSIS — I10 Essential (primary) hypertension: Secondary | ICD-10-CM

## 2011-09-30 LAB — COMPLETE METABOLIC PANEL WITH GFR
Alkaline Phosphatase: 56 U/L (ref 39–117)
BUN: 17 mg/dL (ref 6–23)
GFR, Est Non African American: 66 mL/min
Glucose, Bld: 151 mg/dL — ABNORMAL HIGH (ref 70–99)
Total Bilirubin: 0.2 mg/dL — ABNORMAL LOW (ref 0.3–1.2)

## 2011-09-30 LAB — LIPID PANEL
Cholesterol: 167 mg/dL (ref 0–200)
HDL: 35 mg/dL — ABNORMAL LOW (ref >39)
Total CHOL/HDL Ratio: 4.8 Ratio
Triglycerides: 139 mg/dL (ref <150)

## 2011-10-13 ENCOUNTER — Other Ambulatory Visit: Payer: Self-pay | Admitting: Internal Medicine

## 2011-10-26 ENCOUNTER — Telehealth: Payer: Self-pay | Admitting: Internal Medicine

## 2011-10-26 MED ORDER — TRAMADOL HCL 50 MG PO TABS: 50.0000 mg | ORAL_TABLET | Freq: Four times a day (QID) | ORAL | Status: DC | PRN

## 2011-10-26 NOTE — Telephone Encounter (Signed)
RX has been sent and to the pharmacy

## 2011-10-26 NOTE — Telephone Encounter (Signed)
[facility] requesting rx for  Tramadol 50 mg <> take 1 tablet every 6 hours as needed for pain (cough) #20 X5 Pt had rx filled on 4-25<>5-7<>5-17,<>5-28<>6-14<>6-26 No Known Allergies Dr Maple Hudson please advise  Thank you

## 2011-10-26 NOTE — Telephone Encounter (Signed)
Per [facility]-okay to refill #50 1 every 6 hours prn cough

## 2011-10-27 ENCOUNTER — Encounter: Payer: Medicaid Other | Admitting: Internal Medicine

## 2011-11-08 ENCOUNTER — Ambulatory Visit: Payer: Medicare Other | Admitting: Sports Medicine

## 2011-11-12 ENCOUNTER — Ambulatory Visit (INDEPENDENT_AMBULATORY_CARE_PROVIDER_SITE_OTHER): Payer: Medicare Other | Admitting: Sports Medicine

## 2011-11-12 ENCOUNTER — Ambulatory Visit (HOSPITAL_COMMUNITY)
Admission: RE | Admit: 2011-11-12 | Discharge: 2011-11-12 | Disposition: A | Discharge status: Home / Self Care | Payer: Medicare Other | Source: Ambulatory Visit | Attending: Sports Medicine | Admitting: Sports Medicine

## 2011-11-12 ENCOUNTER — Encounter: Payer: Self-pay | Admitting: Sports Medicine

## 2011-11-12 VITALS — BP 144/74

## 2011-11-12 DIAGNOSIS — M545 Low back pain, unspecified: Secondary | ICD-10-CM | POA: Insufficient documentation

## 2011-11-12 DIAGNOSIS — M79609 Pain in unspecified limb: Secondary | ICD-10-CM

## 2011-11-12 DIAGNOSIS — M79606 Pain in leg, unspecified: Secondary | ICD-10-CM

## 2011-11-12 DIAGNOSIS — M79605 Pain in left leg: Secondary | ICD-10-CM

## 2011-11-12 DIAGNOSIS — G5690 Unspecified mononeuropathy of unspecified upper limb: Secondary | ICD-10-CM

## 2011-11-12 MED ORDER — PREGABALIN 100 MG PO CAPS: 100.0000 mg | ORAL_CAPSULE | Freq: Three times a day (TID) | ORAL | Status: DC

## 2011-11-12 NOTE — Progress Notes (Signed)
DEPO MEDROL 80MG  IM L [facility]: [id number] EXP: 05/2014

## 2011-11-12 NOTE — Progress Notes (Signed)
  Subjective:    Patient ID: Melissa Villa, female    DOB: 08-09-1945, 66 y.o.   MRN: [id number]  HPI 66 yo AAF, with MMP including T2DM with associated neuropathy, who presents with complaints of R elbow pain and L low back / LE pain.  1. R elbow pain. --Seen in April and diagnosed with lateral epicondylitis. Had injection at that time without significant relief of symptoms. Has radiating pain from lateral elbow to forearm and hand at times. Has had recent associated tingling into the hand as well. Cannot specify which fingers. No weakness of the forearm or hand. No significant swelling.  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 No midline neck pain or symptoms radiating from the neck.  2. L LE pain. --Seen previously for bilateral LE pain/numbness/tingling thought to be associated with diabetic peripheral neuropathy. Pt started on lyrica with significant improvement in these symptoms. Now has what she thinks is new pain to her L LE that started insidiously about a month ago. No specific injury. Has shooting pain that starts in the L low back/buttocks and radiates down the leg to the foot. No specific associated numbness/tingling/weakness with this. Bothers more when lying down or trying to be still. Improved when moving/walking. No bowel or bladder dysfunction. No fevers or chills.  Of note, pt states last HgbA1C 7.1.   Review of Systems Per HPI.    Objective:   Physical Exam Neck: Normal ROM without pain flex/ext. Limited ROM without significant pain with rotation/lateral flex. Negative Spurling's on the right. Shoulder: Limited ext rotation/flex/abduction on R compared to L. TTP over lateral shoulder. Arm: TTP over R lat epicondyle and extensor mechanisim. Pain over radial tunnel as well. Good strength throughout wrist flex/ext, supination/pronation. Mild pain with supination.  Normal sensation over fingers to light touch as compared to L. Back: Strength testing limited L side secondary to pain. Negative SLR bilaterally. Diminished reflexes throughout however somewhat 1-2+ plantar reflex on L. No pain with L hip int/ext rotation. Some discomfort with FABER testing on the L.       Assessment & Plan:  1. 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 2. While L LE pain may be have component of diabetic neuropathy, at least concern for sciatica vs L sided nerve impingement given radiating pain.  --Will give depomedrol IM today in hopes of anti-inflammatory effect helping with all symptoms above. Advised about transient elevated blood sugars with this.  --Will increase Lyrica from 100 mg twice a day to 100 mg 3 times a day  --Plain films L-spine to assess for foraminal narrowing possibly suggestive of nerve impingement. Of note, pt has pacemaker and will not be able to obtain MRI in the future.  --F/u 3-4 weeks to reassess.

## 2011-11-16 ENCOUNTER — Other Ambulatory Visit: Payer: Self-pay

## 2011-11-16 DIAGNOSIS — Z124 Encounter for screening for malignant neoplasm of cervix: Secondary | ICD-10-CM

## 2011-11-17 ENCOUNTER — Encounter: Payer: Self-pay | Admitting: Obstetrics & Gynecology

## 2011-11-24 ENCOUNTER — Ambulatory Visit (INDEPENDENT_AMBULATORY_CARE_PROVIDER_SITE_OTHER): Payer: Medicare Other | Admitting: Internal Medicine

## 2011-11-24 ENCOUNTER — Encounter: Payer: Self-pay | Admitting: Internal Medicine

## 2011-11-24 VITALS — BP 139/68 | HR 60 | Temp 97.1°F | Wt 284.1 lb

## 2011-11-24 DIAGNOSIS — E119 Type 2 diabetes mellitus without complications: Secondary | ICD-10-CM

## 2011-11-24 DIAGNOSIS — R05 Cough: Secondary | ICD-10-CM

## 2011-11-24 DIAGNOSIS — M79606 Pain in leg, unspecified: Secondary | ICD-10-CM

## 2011-11-24 DIAGNOSIS — E7800 Pure hypercholesterolemia, unspecified: Secondary | ICD-10-CM

## 2011-11-24 DIAGNOSIS — G4733 Obstructive sleep apnea (adult) (pediatric): Secondary | ICD-10-CM

## 2011-11-24 DIAGNOSIS — M79609 Pain in unspecified limb: Secondary | ICD-10-CM

## 2011-11-24 DIAGNOSIS — I10 Essential (primary) hypertension: Secondary | ICD-10-CM

## 2011-11-24 DIAGNOSIS — Z79899 Other long term (current) drug therapy: Secondary | ICD-10-CM

## 2011-11-24 LAB — POCT GLYCOSYLATED HEMOGLOBIN (HGB A1C): Hemoglobin A1C: 7.4

## 2011-11-24 MED ORDER — SITAGLIPTIN PHOSPHATE 100 MG PO TABS: 100.0000 mg | ORAL_TABLET | Freq: Every day | ORAL | Status: DC

## 2011-11-24 MED ORDER — HYDROCODONE-ACETAMINOPHEN 5-325 MG PO TABS: ORAL_TABLET | ORAL | Status: DC

## 2011-11-24 MED ORDER — TRIAMCINOLONE ACETONIDE 0.1 % EX CREA: TOPICAL_CREAM | CUTANEOUS | Status: DC

## 2011-11-24 MED ORDER — INSULIN GLARGINE 100 UNIT/ML ~~LOC~~ SOLN: 42.0000 [IU] | Freq: Every day | SUBCUTANEOUS | Status: DC

## 2011-11-24 MED ORDER — ROSUVASTATIN CALCIUM 5 MG PO TABS: 10.0000 mg | ORAL_TABLET | Freq: Every day | ORAL | Status: DC

## 2011-11-24 NOTE — Assessment & Plan Note (Signed)
Lab Results  Component Value Date   NA 140 09/30/2011   K 4.3 09/30/2011   CL 106 09/30/2011   CO2 24 09/30/2011   BUN 17 09/30/2011   CREATININE 0.92 09/30/2011    BP Readings from Last 3 Encounters:  11/24/11 139/68  11/12/11 144/74  09/29/11 140/80    Assessment: Hypertension control:  controlled  Progress toward goals:  at goal Barriers to meeting goals:  no barriers identified  Plan: Hypertension treatment:  continue current medications (amlodipine 10 mg daily, carvedilol 12.5 mg twice a day, and valsartan 80 mg twice a day).

## 2011-11-24 NOTE — Assessment & Plan Note (Signed)
Hemoglobin A1C  Date Value Range Status  11/24/2011 7.4   Final  08/25/2011 6.8   Final  06/09/2011 8.7   Final     Assessment: Diabetes control: Hemoglobin A1c is slightly above goal today.  Patient reports that she is compliant with her medications Progress toward goals: deteriorated Barriers to meeting goals: no barriers identified  Plan: Diabetes treatment: Increase Lantus insulin to a dose of 42 units daily; continue metformin 850 mg 3 times a day, and continue Januvia 100 mg daily. Instruction/counseling given: reminded to bring medications to each visit and discussed diet

## 2011-11-24 NOTE — Patient Instructions (Addendum)
Increase Lantus insulin to a dose of 42 units once daily. Stop Januvia 50 mg tablets. Start Januvia 100 mg tablets one tablet daily. Increase rosuvastatin (Crestor) 5 mg to a dose of 2 tablets daily. Apply triamcinolone 0.1% cream to skin rash on leg twice a day for 2 weeks; call if the rash persists or worsens.

## 2011-11-24 NOTE — Assessment & Plan Note (Signed)
Assessment: Patient has a chronic cough, and reports significant improvement following the addition of tramadol to her regimen by Dr. Maple Hudson.  Plan: Continue current regimen.

## 2011-11-24 NOTE — Progress Notes (Signed)
  Subjective:    Patient ID: Melissa Villa, female    DOB: 05/05/1945, 66 y.o.   MRN: [id number]  HPI Patient returns for followup of her diabetes mellitus, hypercholesterolemia, hypertension, and other chronic medical problems.  She reports that she has been doing well, and recently has felt much better than has been usual over the past several months.  She was started on Lyrica for back and lower extremity pain pain by the sports medicine clinic, and reports significant improvement in her pain following increase in her Lyrica.  Her chronic cough has been better controlled following the addition of tramadol to her regimen by her pulmonologist Dr. Maple Hudson.  Her blood sugars have been running a little bit higher, and she is frustrated by her inability to lose more weight.  She reports that she has been compliant with her medications.   Review of Systems  Cardiovascular: Negative for chest pain and leg swelling.  Gastrointestinal: Negative for nausea, vomiting and abdominal pain.  Musculoskeletal: Positive for back pain (Chronic back and lower extremity pain, more on the left.).  Neurological: Positive for headaches (Chronic morning headaches which usually resolve after about an hour; occasionally she needs an analgesic.).       Objective:   Physical Exam  Constitutional: No distress.  Cardiovascular: Normal rate and regular rhythm.  Exam reveals no gallop and no friction rub.   No murmur heard. Pulmonary/Chest: Effort normal and breath sounds normal. She has no wheezes. She has no rales.  Abdominal: Soft. Bowel sounds are normal. She exhibits no distension. There is no tenderness. There is no rebound and no guarding.  Musculoskeletal: She exhibits no edema.       Assessment & Plan:

## 2011-11-24 NOTE — Assessment & Plan Note (Signed)
Lipids:    Component Value Date/Time   CHOL 167 09/30/2011 0922   TRIG 139 09/30/2011 0922   HDL 35* 09/30/2011 0922   LDLCALC 104* 09/30/2011 0922   VLDL 28 09/30/2011 0922   CHOLHDL 4.8 09/30/2011 0922   Assessment: LDL is slightly above target on current dose of rosuvastatin (Crestor) 5 mg daily.  Plan: Increase rosuvastatin to a dose of 10 mg daily.

## 2011-11-24 NOTE — Assessment & Plan Note (Signed)
Assessment: Patient has chronic low back and lower extremity the pain, left more than right, which is significantly better on her current regimen which includes Lyrica, hydrocodone/acetaminophen, and tramadol.  I reviewed the controlled medication contract with patient today, and she signed the contract.  Plan: Continue current regimen; patient will follow up in sports medicine clinic as scheduled.

## 2011-11-24 NOTE — Assessment & Plan Note (Signed)
Assessment: Patient reports that she wears her CPAP regularly, although she has had some problems with mask fit.  She reports frequent morning headaches which resolve after about an hour and which I suspect are related to her sleep apnea.  These seem to be adequately controlled on her current analgesic regimen.  Plan: Continue CPAP; patient will followup with her pulmonologist Dr. Maple Hudson as scheduled, [name] advised her to discuss the morning headaches with him.  I don't think further workup of her headaches at this time is indicated.

## 2011-11-29 ENCOUNTER — Encounter: Payer: Self-pay | Admitting: Internal Medicine

## 2011-11-29 ENCOUNTER — Ambulatory Visit (INDEPENDENT_AMBULATORY_CARE_PROVIDER_SITE_OTHER): Payer: Medicaid Other | Admitting: Internal Medicine

## 2011-11-29 ENCOUNTER — Encounter: Payer: Self-pay | Admitting: Sports Medicine

## 2011-11-29 ENCOUNTER — Encounter: Payer: Medicaid Other | Admitting: Internal Medicine

## 2011-11-29 ENCOUNTER — Ambulatory Visit (INDEPENDENT_AMBULATORY_CARE_PROVIDER_SITE_OTHER): Payer: Medicare Other | Admitting: Sports Medicine

## 2011-11-29 VITALS — BP 148/63 | HR 59 | Ht 64.5 in | Wt 277.1 lb

## 2011-11-29 VITALS — BP 135/77 | HR 60 | Ht 64.5 in | Wt 283.0 lb

## 2011-11-29 DIAGNOSIS — I442 Atrioventricular block, complete: Secondary | ICD-10-CM

## 2011-11-29 DIAGNOSIS — Z950 Presence of cardiac pacemaker: Secondary | ICD-10-CM

## 2011-11-29 DIAGNOSIS — I10 Essential (primary) hypertension: Secondary | ICD-10-CM

## 2011-11-29 DIAGNOSIS — M5412 Radiculopathy, cervical region: Secondary | ICD-10-CM

## 2011-11-29 LAB — PACEMAKER DEVICE OBSERVATION
AL AMPLITUDE: 2.5 mv
BAMS-0001: 150 {beats}/min
BATTERY VOLTAGE: 2.95 V
DEVICE MODEL PM: 2277328
RV LEAD AMPLITUDE: 12 mv
RV LEAD THRESHOLD: 1.25 V

## 2011-11-29 NOTE — Patient Instructions (Signed)
Your physician wants you to follow-up in: 12 months with Dr Nash Dimmer will receive a reminder letter in the mail two months in advance. If you don't receive a letter, please call our office to schedule the follow-up appointment.

## 2011-11-29 NOTE — Progress Notes (Signed)
  Subjective:    Patient ID: Melissa Villa, female    DOB: [birth year], 66 y.o.   MRN: [id number]  HPI  66 year old F with chronic lower back pain and right shoulder pain who presents for a follow up. At her last visit, she received an injection of depomedrol IM and increased her lyrica to 100 mg TID. Since that time she notes improvement in her back pain, but persistent right arm pain. She states that the pain is shooting down right arm distal to the elbow. She has previously tried physical therapy for what was suspected to be a shoulder injury.     Review of Systems     Objective:   Physical Exam BP 135/77  Pulse 60  Ht 5' 4.5" (1.638 m)  Wt 283 lb (128.368 kg)  BMI 47.83 kg/m2 Gen: alert, pleasant, morbidly obese MSK: 5/5 strength of UE Neuro: normal DTR for upper extremities        Assessment & Plan:  66 year old F with improving lumbar spinal pain and right upper extremity pain that is concerning for a cervical radiculopathy. She will likely benefit from traction therapy and will be referred to PT. Continue Lyrica 100 mg TID. Follow up in 3-4 weeks.

## 2011-11-29 NOTE — Progress Notes (Signed)
HPI Melissa Villa today for followup. She is a very pleasant morbidly obese 66 year old woman with hypertension, diabetes, morbid obesity, and symptomatic bradycardia, status post permanent pacemaker insertion. She has done well in the interim except for complaints of palpitations and not sleeping well. She notes that when she gets up and tries to walk or move suddenly, it feels like her heart is beating fast. No syncope. With regard to her sleep, she feels like she does not get enough rest tonight. She has a history of sleep apnea and is on CPAP.  No Known Allergies   Current Outpatient Prescriptions  Medication Sig Dispense Refill  . albuterol (PROAIR HFA) 108 (90 BASE) MCG/ACT inhaler Inhale 2 puffs into the lungs 4 (four) times daily as needed for wheezing or shortness of breath.  1 Inhaler  11  . amLODipine (NORVASC) 10 MG tablet Take 1 tablet (10 mg total) by mouth daily.  31 tablet  11  . Calcium Carbonate-Vitamin D (CALCIUM 600+D) 600-400 MG-UNIT per tablet Take 1 tablet by mouth 2 (two) times daily.        . carvedilol (COREG) 12.5 MG tablet Take 2 tablets (25 mg total) by mouth 2 (two) times daily.  120 tablet  6  . cetirizine (ZYRTEC) 10 MG tablet Take 1 tablet (10 mg total) by mouth daily. For seasonal allergies.  30 tablet  11  . Dexlansoprazole (DEXILANT) 60 MG capsule Take 60 mg by mouth every morning. Take 15 minutes before breakfast.      . dipyridamole-aspirin (AGGRENOX) 25-200 MG per 12 hr capsule Take 1 capsule by mouth 2 (two) times daily.  62 each  11  . Fluticasone-Salmeterol (ADVAIR DISKUS) 250-50 MCG/DOSE AEPB Inhale 1 puff into the lungs 2 (two) times daily.  60 each  11  . glucose blood (ACCU-CHEK AVIVA PLUS) test strip 1 each by Other route as needed. Use as to check blood sugar up to 3 times daily as instructed DX code:250.00      . HYDROcodone-acetaminophen (NORCO/VICODIN) 5-325 MG per tablet Take one and one-half tablets four times a day as needed for pain.  180  tablet  3  . insulin glargine (LANTUS) 100 UNIT/ML injection Inject 42 Units into the skin daily.  15 mL  5  . Insulin Pen Needle (B-D ULTRAFINE III SHORT PEN) 31G X 8 MM MISC Use as directed for insulin injections.  100 each  3  . Lancets Misc. (ACCU-CHEK FASTCLIX LANCET) KIT by Does not apply route. Use as to check blood sugar up to 3 times daily as instructed DX code:250.00       . metFORMIN (GLUCOPHAGE) 850 MG tablet Take 1 tablet (850 mg total) by mouth 3 (three) times daily.  90 tablet  6  . multivitamin (THERAGRAN) per tablet Take 1 tablet by mouth daily.        [name] NASONEX 50 MCG/ACT nasal spray USE 2 SPRAYS IN EACH NOSTRIL ONCE A DAY  1 Inhaler  11  . pregabalin (LYRICA) 100 MG capsule Take 1 capsule (100 mg total) by mouth 3 (three) times daily.  90 capsule  2  . rosuvastatin (CRESTOR) 5 MG tablet Take 2 tablets (10 mg total) by mouth daily.  60 tablet  6  . sitaGLIPtin (JANUVIA) 100 MG tablet Take 1 tablet (100 mg total) by mouth daily.  30 tablet  6  . tiotropium (SPIRIVA) 18 MCG inhalation capsule Place 1 capsule (18 mcg total) into inhaler and inhale daily.  30 capsule  11  .  traMADol (ULTRAM) 50 MG tablet Take 1 tablet (50 mg total) by mouth every 6 (six) hours as needed for pain (cough).  50 tablet  0  . triamcinolone cream (KENALOG) 0.1 % Apply a thin layer topically to affected areas on leg twice a day for 2 weeks.  30 g  1  . valsartan (DIOVAN) 80 MG tablet Take 2 tablets (160 mg total) by mouth 2 (two) times daily.  120 tablet  6     Past Medical History  Diagnosis Date  . CHF (congestive heart failure)      2-D echo 02/19/2009 showed left ventricle cavity size normal; systolic function normal, estimated ejection fraction 55%; wall motion normal; no regional wall motion abnormalities; PA peak pressure 47mm Hg.  [name] COPD (chronic obstructive pulmonary disease)     Pulmonary function tests on 08/05/2010 showed mixed obstructive and restrictive lung disease with no significant  response to bronchodilator, and decreased diffusion capacity that corrects to normal range when adjusted for the inhaled alveolar volume.  . Hypertension   . TIA (transient ischemic attack)   . Anemia   . 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  Patient is followed by Dr. [name] at the cancer center.  . Degenerative joint disease   . AV block, complete     S/P dual-chamber for symptomatic episodes of bradycardia and complete heart block.  . Pacemaker     S/P dual-chamber for symptomatic episodes of bradycardia and complete heart block.  . Adenomatous polyp of colon     Tubular adenomas X 2 found 06/1999; negative screening colonoscopy 02/13/2004 by Dr. [name].  . Hypercholesteremia   . Obstructive sleep apnea     Baseline diagnostic nocturnal polysomnogram on July 27, 2005 showed severe obstructive sleep apnea/hypopnea syndrome, with AHI 153.2 per hour.  Last nocturnal polysomnogram done for CPAP titration was 05/01/2009; CPAP was titrated to 13 CWP, AHI 1.8 per hour using a large Respironics FitLife Full Face Mask with heated humidifier.  . Peripheral autonomic neuropathy due to diabetes mellitus   . 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 [name]  . Constipation   . Hand pain   . Palpitation   . Rib pain     right sided   . DM type 2 (diabetes mellitus, type 2)   . Skin rash   . Urinary incontinence   . Proliferative diabetic retinopathy     S/P panretinal photocoagulation by Dr. [name]  . Vitreous hemorrhage     With tractional detachment, left eye; S/P posterior vitrectomy with membrane peel by Dr. [name] 04/06/2005  . Dyspnea on exertion       Cardiac cath 06/17/2005 by Dr. [name] showed LVEF 50-55%, 30% proximal LAD stenosis, small diagonals.   . Shingles rash 03/25/11    ROS:   All systems reviewed and negative except as noted in the HPI.   Past Surgical History  Procedure Date  . Mastectomy partial / lumpectomy w/ axillary lymphadenectomy 1992, 1995    S/P left lumpectomy and axillary lymph node dissection December 1992 for a T1 N0 medullary breast cancer, no lymph node involvement, treated with tamoxifen for  2 years; S/P right lumpectomy and axillary lymph node dissection August 1995 for a T1 N0 microinvasive breast cancer, treated with Arimidex for 5 years.  Patient is followed by Dr. [name] at the cancer center.  . Lung lobectomy 06/13/2001    S/P right lower lobe lobectomy 06/13/2001 by Dr. [name]  . Pacemaker insertion 04/20/2001  . Cholecystectomy   . Tonsillectomy      Family History  Problem Relation Age of Onset  . Breast cancer Mother 28    Deceased October 02, 2002  . Diabetes type II Mother   . Diabetes type II Brother   . Hypertension Brother   . Heart attack Brother   . Stroke Brother   . Hypertension Brother   . Hypertension Sister   . Cervical cancer Neg Hx   . Colon cancer Neg Hx      History   Social History  . Marital Status: Widowed    Spouse Name: N/A    Number of Children: N/A  . Years of Education: N/A   Occupational History  . Not on file.   Social History Main Topics  . Smoking status: Former Smoker -- 1.0 packs/day for 15 years    Types: Cigarettes    Quit date: 04/19/2001  . Smokeless tobacco: Never Used  . Alcohol Use: No  . Drug Use: No  . Sexually Active: Not on file   Other Topics Concern  . Not on file   Social History Narrative  . No narrative on file     BP 148/63  Pulse 59  Ht 5' 4.5" (1.638 m)  Wt 277 lb 1.9 oz (125.701 kg)  BMI 46.83 kg/m2  Physical Exam:  obese appearing 66 yo woman, NAD HEENT: Unremarkable Neck:  No JVD, no  thyromegally Lungs:  Clear with no wheezes HEART:  Regular rate rhythm, no murmurs, no rubs, no clicks Abd:  soft, positive bowel sounds, no organomegally, no rebound, no guarding Ext:  2 plus pulses, no edema, no cyanosis, no clubbing Skin:  No rashes no nodules Neuro:  CN II through XII intact, motor grossly intact  DEVICE  Normal device function.  See PaceArt for details.   Assess/Plan:

## 2011-11-29 NOTE — Assessment & Plan Note (Signed)
Her blood pressure is somewhat elevated today. She admits to dietary indiscretion. I've asked the patient to continue her current medications and to lose weight.

## 2011-11-29 NOTE — Assessment & Plan Note (Signed)
Her device is working normally except for an exaggerated response. Today we reprogrammed her device to hopefully help him to reduce her palpitations.

## 2011-12-01 ENCOUNTER — Other Ambulatory Visit: Payer: Self-pay | Admitting: Internal Medicine

## 2011-12-01 NOTE — Telephone Encounter (Signed)
Faxed refill request received from [facility] for Tramadol 50mg  take one tablet every 6 hours as needed for pain (cough). Patient last seen 07/28/11. Dr Maple Hudson please advise if ok to refill. Thanks.

## 2011-12-02 ENCOUNTER — Ambulatory Visit: Payer: Medicare Other | Attending: Sports Medicine

## 2011-12-02 DIAGNOSIS — M25619 Stiffness of unspecified shoulder, not elsewhere classified: Secondary | ICD-10-CM | POA: Insufficient documentation

## 2011-12-02 DIAGNOSIS — M6281 Muscle weakness (generalized): Secondary | ICD-10-CM | POA: Insufficient documentation

## 2011-12-02 DIAGNOSIS — R293 Abnormal posture: Secondary | ICD-10-CM | POA: Insufficient documentation

## 2011-12-02 DIAGNOSIS — IMO0001 Reserved for inherently not codable concepts without codable children: Secondary | ICD-10-CM | POA: Insufficient documentation

## 2011-12-02 DIAGNOSIS — M25519 Pain in unspecified shoulder: Secondary | ICD-10-CM | POA: Insufficient documentation

## 2011-12-02 NOTE — Telephone Encounter (Signed)
Refill phoned in per CY.

## 2011-12-13 ENCOUNTER — Encounter: Payer: Medicare Other | Admitting: Obstetrics & Gynecology

## 2011-12-15 ENCOUNTER — Encounter: Payer: Self-pay | Admitting: Family Medicine

## 2011-12-15 ENCOUNTER — Ambulatory Visit (INDEPENDENT_AMBULATORY_CARE_PROVIDER_SITE_OTHER): Payer: Medicare Other | Admitting: Family Medicine

## 2011-12-15 ENCOUNTER — Other Ambulatory Visit (HOSPITAL_COMMUNITY)
Admission: RE | Admit: 2011-12-15 | Discharge: 2011-12-15 | Disposition: A | Discharge status: Home / Self Care | Payer: Medicare Other | Source: Ambulatory Visit | Attending: Obstetrics & Gynecology | Admitting: Obstetrics & Gynecology

## 2011-12-15 VITALS — BP 121/63 | HR 57 | Temp 98.3°F | Ht 64.0 in | Wt 286.6 lb

## 2011-12-15 DIAGNOSIS — Z113 Encounter for screening for infections with a predominantly sexual mode of transmission: Secondary | ICD-10-CM

## 2011-12-15 DIAGNOSIS — Z01419 Encounter for gynecological examination (general) (routine) without abnormal findings: Secondary | ICD-10-CM

## 2011-12-15 DIAGNOSIS — Z124 Encounter for screening for malignant neoplasm of cervix: Secondary | ICD-10-CM

## 2011-12-15 NOTE — Progress Notes (Signed)
Subjective:    Patient ID: Melissa Villa, female    DOB: 1945-09-24, 66 y.o.   MRN: [id number]  HPI Pt is a 66 y.o. post-menopausal female here for routine gyn exam. LMP unknown - sometime between 56 and 57 yrs. Pt has hx bilateral breast cancer, left then right. Both s/p lumpectomy, radiation and "cancer medication". Sees Dr. Timothy Lasso at the [facility], last visit in March 2013, mammogram scheduled for Sept.  Note states no concerns. Pt does c/o skin changes on R breast - swelling and tenderness in axilla, lateral aspect, darkening, tightening, thickening of skin. Skin itches and peels frequently and no moisturizer has worked so far. No nipple discharge.  No abnl pap smears per pt. Last pap in 2011 normal. Pt is widowed x 6 yrs, has not had intercourse since before that time. No vaginal bleeding or discharge.   Cholesterol and other labs done in June at [facility] office Margarito Liner, MD).   Past Medical History  Diagnosis Date  . CHF (congestive heart failure)      2-D echo 02/19/2009 showed left ventricle cavity size normal; systolic function normal, estimated ejection fraction 55%; wall motion normal; no regional wall motion abnormalities; PA peak pressure 47mm Hg.  [name] COPD (chronic obstructive pulmonary disease)     Pulmonary function tests on 08/05/2010 showed mixed obstructive and restrictive lung disease with no significant response to bronchodilator, and decreased diffusion capacity that corrects to normal range when adjusted for the inhaled alveolar volume.  . Hypertension   . TIA (transient ischemic attack)   . Anemia   . 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  Patient is followed by Dr. [name]  at the cancer center.  . Degenerative joint disease   . AV block, complete     S/P dual-chamber for symptomatic episodes of bradycardia and complete heart block.  . Pacemaker     S/P dual-chamber for symptomatic episodes of bradycardia and complete heart block.  . Adenomatous polyp of colon     Tubular adenomas X 2 found 06/1999; negative screening colonoscopy 02/13/2004 by Dr. [name].  . Hypercholesteremia   . Obstructive sleep apnea     Baseline diagnostic nocturnal polysomnogram on July 27, 2005 showed severe obstructive sleep apnea/hypopnea syndrome, with AHI 153.2 per hour.  Last nocturnal polysomnogram done for CPAP titration was 05/01/2009; CPAP was titrated to 13 CWP, AHI 1.8 per hour using a large Respironics FitLife Full Face Mask with heated humidifier.  . Peripheral autonomic neuropathy due to diabetes mellitus   . 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 [name]  . Constipation   . Hand pain   . Palpitation   . Rib pain     right sided   . DM type 2 (diabetes mellitus, type 2)   . Skin rash   . Urinary incontinence   . Proliferative diabetic retinopathy     S/P panretinal photocoagulation by Dr. [name]  . Vitreous hemorrhage     With tractional detachment, left eye; S/P posterior vitrectomy with membrane peel by Dr. [name] 04/06/2005  . Dyspnea on exertion     Cardiac cath 06/17/2005 by Dr.  Harwani showed LVEF 50-55%, 30% proximal LAD stenosis, small diagonals.   . Shingles rash 03/25/11   Past Surgical History  Procedure Date  . 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  Patient  is followed by Dr. [name] at the cancer center.  . Lung lobectomy 06/13/2001    S/P right lower lobe lobectomy 06/13/2001 by Dr. [name]  . Pacemaker insertion 04/20/2001  . Cholecystectomy   . Tonsillectomy     History   Social History  . Marital Status: Widowed    Spouse Name: N/A    Number of Children: N/A  . Years of Education: N/A   Occupational History  . Not on file.   Social History Main Topics  . Smoking status: Former Smoker -- 1.0 packs/day for 15 years    Types: Cigarettes    Quit date: 04/19/2001  . Smokeless tobacco: Never Used  . Alcohol Use: No  . Drug Use: No  . Sexually Active: No   Other Topics Concern  . Not on file   Social History Narrative  . No narrative on file     Review of Systems  Constitutional: Positive for fatigue. Negative for fever and chills.  Respiratory: Positive for shortness of breath. Negative for cough.   Genitourinary: Negative for dysuria, urgency, vaginal bleeding, vaginal discharge, difficulty urinating, genital sores, vaginal pain, menstrual problem and pelvic pain.  Musculoskeletal: Positive for back pain and arthralgias.  Neurological: Negative for dizziness and speech difficulty.  Psychiatric/Behavioral: Negative for confusion and agitation.       Some memory issues - forgetting what she went into room for.       Objective:   Physical Exam  Constitutional: She is oriented to person, place, and time. She appears well-developed and well-nourished. No distress.       obese  HENT:  Head: Normocephalic and atraumatic.  Eyes: Conjunctivae and EOM are normal.  Neck: Normal range of motion.  Cardiovascular: Normal rate and regular rhythm.   Pulmonary/Chest: Effort normal.  Abdominal: Soft. There is no tenderness. There is no rebound and no guarding.       Obese, non-tender  Genitourinary: Vagina normal. No tenderness or bleeding around the vagina. No vaginal discharge found.       Normal vagina, cervix. Uterus  small, not fixed. No CMT. No adnexal masses or tenderness. No external lesions. No vaginal discharge.  Breasts:  Radiation changes, surgical scars. R breast, nipple somewhat retracted. Several small, darkly pigmented, scaly papules at 3:00 in linear pattern. No redness or drainage, non-tender. Darkening of skin across breast. Skin taut.   Musculoskeletal: She exhibits no edema and no tenderness.  Neurological: She is alert and oriented to person, place, and time.  Skin: Skin is warm and dry.  Psychiatric: She has a normal mood and affect.      Assessment & Plan:  1.  Routine gyn exam:  Pap smear done today. If normal, pt will not need continued pap smears. Did encourage pt to get regular gyn/pelvic exam. Will send results to PCP. 2.  Breast changes:  Recommended pt follow up with Dr. Timothy Lasso regarding probably lymphedema and radiation changes of skin. 3.  Medical  management of DM, HTN, heart disease, hyperlipidemia by PCP.

## 2011-12-15 NOTE — Patient Instructions (Signed)
Lymphedema Lymphedema is a swelling caused by the abnormal collection of lymph under the skin. The lymph is fluid from the tissues in your body that travels in the lymphatic system. This system is part of the immune system that includes lymph nodes and vessels. The lymph vessels collect and carry the excess fluid, fats, proteins, and wastes from the tissues of the body to the bloodstream. This system also works to clean and remove bacteria and waste products from the body.  Lymphedema occurs when the lymphatic system is blocked. When the lymph vessels or lymph nodes are blocked or damaged, lymph does not drain properly. This causes abnormal build up of lymph. This leads to swelling in the arms or legs. Lymphedema cannot be cured by medicines. But the swelling can be reduced by physical methods. CAUSES  There are two types of Lymphedema. Primary lymphedema is caused by the absence or abnormality of the lymph vessel at birth. It is also known as inherited lymphedema, which occurs rarely. Secondary or acquired lymphedema occurs when the lymph vessel is damaged or blocked. The causes of lymph vessel blockage are:   Skin infection like cellulites.   Infection by parasites (filariasis).   Injury.   Cancer.   Radiation therapy.   Formation of scar tissue.   Surgery.  SYMPTOMS  The symptoms of lymphedema are:  Abnormal swelling of the arm or leg.   Heavy or tight feeling in your arm or leg.   Tight-fitting shoes or rings.   Redness of skin over the affected area.   Limited movement of the affected limb.   Some patients complain about sensitivity to touch and discomfort in the limb(s) affected.  You may not have these symptoms immediately following injury. They usually appear within a few days or even years after injury. Inform your caregiver, if you have any of these symptoms. Early treatment can avoid further problems.  DIAGNOSIS  First, your caregiver will inquire about any surgery you  have had or medicines you are taking. He will then examine you. Your caregiver may order special imaging tests, such as:  Lymphoscintigraphy (a test in which a low dose of radioactive substance is injected to trace the flow of lymph through the lymph vessels).   MRI (imaging tests using sound waves).   Computed tomography (test using special cross-sectional X-rays).   Duplex ultrasound (test using high-frequency sound waves to show the vessels and the blood flow on a screen).   Lymphangiography (special X-ray taken after injecting a contrast dye into the lymph vessel). It is now rarely done.  TREATMENT  Lymphedema can be treated in different ways. Your caregiver will decide the type of treatment depending on the cause. Treatment may include:  Exercise: Special exercises will help fluid move out easily from the affected part. This should be done as per your caregiver's advice.   Manual lymph drainage: Gentle massage of the affected limb makes the fluid to move out more freely.   Compression: Compression stockings or external pump apply pressure over the affected limb. This helps the fluid to move out from the arm or leg. Bandaging can also help to move the fluid out from the affected part.  Your caregiver will decide the method that suits you the most.   Medicines: Your caregiver may prescribe antibiotics, if you have infection.   Surgery: Your caregiver may advise surgery for severe lymphedema. It is reserved for special cases when the patient has difficulty moving. Your surgeon may remove excess tissue from   the arm or leg. This will help to ease your movement. Physical therapy may have to be continued after surgery.  HOME CARE INSTRUCTIONS   Eat a healthy diet.   Exercise regularly as per advice.   Keep the affected area clean and dry.   Use gloves while cooking or gardening.   Protect your skin from cuts.   Use electric razor to shave the affected area.   Keep affected limb  elevated.   Do not wear tight clothes, shoes, or jewels as it may cause the tissue to be strangled.   Do not use heat pads over the affected area.   Do not sit with cross legs.   Do not walk barefoot.   Do not carry weight on the affected arm.   Avoid having blood pressure checked on the affected limb.   The area is very fragile and is predisposed to injury and infection.  SEEK MEDICAL CARE IF:  You continue to have swelling in your limb. SEEK IMMEDIATE MEDICAL CARE IF:   You have high fever.   You have skin rash.   You have chills or sweats.   You have pain or redness.   You have a cut that does not heal.  MAKE SURE YOU:   Understand these instructions.   Will watch your condition.   Will get help right away if you are not doing well or get worse.  Document Released: 01/31/2007 Document Revised: 03/25/2011 Document Reviewed: 01/06/2009 [facility] Patient Information 2012 [place], [name].   Pap Test Pap tests are obtained from the cervix and vagina to detect the presence of cancer cells or infections. A Pap test is done to screen for cervical cancer.  Who should have Pap tests:  The first Pap test should be done at age 64.   Between ages 64 and 57, Pap tests are repeated every 2 years.   Beginning at age 64, you are advised to have a Pap test every 3 years as long as your past 3 Pap tests have been normal.   Some women have medical problems that increase the chance of getting cervical cancer. Talk to your caregiver about these problems. It is especially important to talk to your caregiver if a new problem develops soon after your last Pap test. In these cases, your caregiver may recommend more frequent screening and Pap tests.   The above recommendations are the same for women who have or have not gotten the vaccine for HPV (Human Papillomavirus).   If you had a hysterectomy for a problem that was not a cancer or a condition that could lead to cancer, then you no  longer need Pap tests. However, even if you no longer need a Pap test, a regular exam is a good idea to make sure no other problems are starting.    If you are between ages 66 and 23, and you have had normal Pap tests going back 10 years, you no longer need Pap tests. However, even if you no longer need a Pap test, a regular exam is a good idea to make sure no other problems are starting.    If you have had past treatment for cervical cancer or a condition that could lead to cancer, you need Pap tests and screening for cancer for at least 20 years after your treatment.   If Pap tests have been discontinued, risk factors (such as a new sexual partner) need to re-assessed to determine if screening should be resumed.  Some women may need screenings more often if they are at high risk for cervical cancer.  Ask your caregiver if you should have one done more or less often. A Pap test should be performed during the weeks before the start of menstruation. It is not as good a test during your menstrual period. Do not douche or have intercourse in the 24 hours before your exam. The specimen is usually taken from the cervix with a scraper, swab, or brush, and it is spread on a slide. This is then carefully examined with a microscope by a specialist. If bleeding occurs from the scraping, it will almost always be minor. The results of your Pap test should be available in 1 to 2 weeks. Make sure you know how you are to get your results. Call your caregiver if you have questions or concerns about your exam and test results. For the protection of your privacy, test results cannot be given over the phone. Make sure you find out the results of the Pap test. If you have not received the results within two weeks, contact your caregiver's office for the results. Do not assume everything is normal if you have not heard from your caregiver or medical facility. It is important to follow up on all of your test  results. Document Released: 05/13/2004 Document Revised: 12/16/2010 Document Reviewed: 08/28/2007 [facility] Patient Information 2012 [place], [name].

## 2011-12-16 ENCOUNTER — Ambulatory Visit: Payer: Medicare Other | Admitting: Rehabilitation

## 2011-12-21 ENCOUNTER — Ambulatory Visit: Payer: Medicare Other | Attending: Sports Medicine | Admitting: Rehabilitation

## 2011-12-21 ENCOUNTER — Encounter: Payer: Self-pay | Admitting: *Deleted

## 2011-12-21 DIAGNOSIS — R293 Abnormal posture: Secondary | ICD-10-CM | POA: Insufficient documentation

## 2011-12-21 DIAGNOSIS — IMO0001 Reserved for inherently not codable concepts without codable children: Secondary | ICD-10-CM | POA: Insufficient documentation

## 2011-12-21 DIAGNOSIS — M6281 Muscle weakness (generalized): Secondary | ICD-10-CM | POA: Insufficient documentation

## 2011-12-21 DIAGNOSIS — M25619 Stiffness of unspecified shoulder, not elsewhere classified: Secondary | ICD-10-CM | POA: Insufficient documentation

## 2011-12-21 DIAGNOSIS — M25519 Pain in unspecified shoulder: Secondary | ICD-10-CM | POA: Insufficient documentation

## 2011-12-21 NOTE — Progress Notes (Signed)
Patient ID: Melissa Villa, female   DOB: [birth year], 66 y.o.   MRN: [id number] PT called to inform us pts O2 sat is 90% and less at times during ambulation during this session. Pt spoke with Margaretha Sheffield to notify him. Per draper, they will fax over a note stating this and we will scan it so that dr Ebony Cargo has access to viewing this.

## 2011-12-24 ENCOUNTER — Ambulatory Visit (INDEPENDENT_AMBULATORY_CARE_PROVIDER_SITE_OTHER)
Admission: RE | Admit: 2011-12-24 | Discharge: 2011-12-24 | Disposition: A | Discharge status: Home / Self Care | Payer: Medicaid Other | Source: Ambulatory Visit | Attending: Internal Medicine | Admitting: Internal Medicine

## 2011-12-24 ENCOUNTER — Other Ambulatory Visit (INDEPENDENT_AMBULATORY_CARE_PROVIDER_SITE_OTHER): Payer: Medicare Other

## 2011-12-24 ENCOUNTER — Encounter: Payer: Self-pay | Admitting: Internal Medicine

## 2011-12-24 ENCOUNTER — Ambulatory Visit (INDEPENDENT_AMBULATORY_CARE_PROVIDER_SITE_OTHER): Payer: Medicare Other | Admitting: Internal Medicine

## 2011-12-24 VITALS — BP 138/84 | HR 60 | Ht 64.0 in | Wt 289.0 lb

## 2011-12-24 DIAGNOSIS — G4733 Obstructive sleep apnea (adult) (pediatric): Secondary | ICD-10-CM

## 2011-12-24 DIAGNOSIS — R0609 Other forms of dyspnea: Secondary | ICD-10-CM

## 2011-12-24 DIAGNOSIS — R0600 Dyspnea, unspecified: Secondary | ICD-10-CM

## 2011-12-24 DIAGNOSIS — R0989 Other specified symptoms and signs involving the circulatory and respiratory systems: Secondary | ICD-10-CM

## 2011-12-24 DIAGNOSIS — Z23 Encounter for immunization: Secondary | ICD-10-CM

## 2011-12-24 DIAGNOSIS — J449 Chronic obstructive pulmonary disease, unspecified: Secondary | ICD-10-CM

## 2011-12-24 DIAGNOSIS — J984 Other disorders of lung: Secondary | ICD-10-CM

## 2011-12-24 DIAGNOSIS — E662 Morbid (severe) obesity with alveolar hypoventilation: Secondary | ICD-10-CM

## 2011-12-24 LAB — CBC WITH DIFFERENTIAL/PLATELET
Basophils Absolute: 0 10*3/uL (ref 0.0–0.1)
Eosinophils Absolute: 0.1 10*3/uL (ref 0.0–0.7)
Hemoglobin: 9.8 g/dL — ABNORMAL LOW (ref 12.0–15.0)
Lymphocytes Relative: 21.1 % (ref 12.0–46.0)
Lymphs Abs: 1.6 10*3/uL (ref 0.7–4.0)
MCHC: 30.3 g/dL (ref 30.0–36.0)
Neutro Abs: 5.4 10*3/uL (ref 1.4–7.7)
RDW: 22.3 % — ABNORMAL HIGH (ref 11.5–14.6)

## 2011-12-24 LAB — BASIC METABOLIC PANEL
Calcium: 9 mg/dL (ref 8.4–10.5)
Chloride: 111 mEq/L (ref 96–112)
Creatinine, Ser: 0.8 mg/dL (ref 0.4–1.2)
GFR: 87.71 mL/min (ref >60.00)

## 2011-12-24 LAB — BRAIN NATRIURETIC PEPTIDE: Pro B Natriuretic peptide (BNP): 43 pg/mL (ref 0.0–100.0)

## 2011-12-24 NOTE — Patient Instructions (Addendum)
Weight loss would help you breathe better  [facility]   Dx obesity hypoventilation syndrome             Lab- CXR                     CBC, BMET, d-dimer, BNP      Dx dyspnea              [facility]- DME [facility] evaluate for light portable O2 2L/M with exertion. Has O2 for sleep    Dx obesity hypoventilation, dyspnea   Flu vax

## 2011-12-24 NOTE — Progress Notes (Signed)
Patient ID: Melissa Villa, female    DOB: 10-23-1945, 66 y.o.   MRN: [id number]  HPI 10/12/10- 58 yoF former smoker seen at kind request of Dr Meredith Pel for sleep evaluation. She complains of difficulty maintaining sleep, waking 4-5 x/ night. Has had 2 sleep studies positive for OSA. Sleeps every night with CPAP, but can't maintain sleep and fights daytime somnolence.  CPAP keeps her awake, but cna't fall asleep without it. The mask cuts her nose and leaks. Has tried Ambien, Rozerem and others. Lives alone and unsure about snoring or leg movement.  Has had a significant cough with a cold but feeling better. R lobectomy 2003 for ? Cancer vs benign nodule. Lumpectomy breast for cancer.  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 She is willing "because I want some rest". New full face mask fits better.   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 Key was that her O2 sat wouldn't stay up even on 1 L/M, recording 73 minutes less than 88%. I explained this may be why she feels unable to sleep well.  She had restrictive lung disease on pulmonary function testing 08/05/2010 which may reflect her lobectomy as well as radiation therapy for her breast cancer and her obesity.  02/03/11-  47 yoF former smoker followed for OSA, restrictive lung disease/ hx R lobectomy/ Carcinoid tumor, complicated by hx breast Ca She is using CPAP at 11 CWP plus oxygen at 2 L/Advanced all night every night. Insomnia remains a problem, she doesn't sleep soundly through the night and is sleepy without naps in the daytime. Temazepam 15 mg is not enough. She takes 2 hours to fall asleep and then if she  wakes to the bathroom she again needs a long time before she can return to sleep.  03/25/11-   26 yoF former smoker followed for OSA, restrictive lung disease/ hx R lobectomy/ Carcinoid tumor, complicated by hx breast Ca Acute visit- 2 weeks ago developed pruritic burning rash on the left ribs. We called in valacyclovirand a prednisone taper based on her verbal description and she comes is requested to let us see the rash. 2 small children weather today, neither of whom has had chickenpox  05/05/11- 65 yoF former smoker followed for OSA, restrictive lung disease/ hx R lobectomy/ Carcinoid tumor, complicated by hx breast Ca  Benzonatate has been a big help but she still coughs some- dry.  CXR 06/22/2010 showed normal heart mild vascular congestion and pacemaker with surgical clips in both axillae.  With clonazepam she has less waking.  She wants to know why she is waking up and we talked about her sleep apnea status that she continues CPAP. We also discussed whether Advair at night would be keeping her awake.  07/28/11-  13 yoF former smoker followed for OSA, restrictive lung disease/ hx R lobectomy/ Carcinoid tumor, complicated by hx breast Ca. Still having increased SOB with walking and gives out quickly(even making bed or washing dishes).Also gets headaches with it. Stays tired, not relieved by sleep. Feels "busy brain" although she dreams. Not taking clonazepam. Does use CPAP with oxygen every night. Insurance won't pay for benzonatate. Still uses Spiriva, Advair, rescue inhaler.  Off of amitriptyline. GERD is under much better control with Paxil and she burps a lot. Chest x-ray 06/28/2011-no active disease. Left pacemaker. Postoperative changes. IMPRESSION:  1. Stable postop change involving the right hemithorax.  Original Report Authenticated By: Rosealee Albee, M.D.    12/24/11- 24 yoF former smoker followed for OSA, restrictive lung disease/ hx R lobectomy/ Carcinoid tumor, complicated by hx  breast Ca. ACUTE VISIT: increased SOB during day now; feels full in chest-unable to cough anything up; uses O2 at night only; unsure of pressure for CPAP-wakes up coughing(has done this chronically). CPAP 13 with O2 2 L/Advanced Not aware of reflux. Uses rescue inhaler at least once or twice daily. Denies pain or swelling in legs. Feels that her heart "labors" during exercise without palpitation or chest pain. COPD assessment test (CAT) 36/40  Review of Systems- see HPI Constitutional:   No weight loss, night sweats,  Fevers, chills, fatigue, lassitude. HEENT:   No headaches,  Difficulty swallowing,  Tooth/dental problems,  Sore throat,                No sneezing, itching, ear ache, nasal congestion, post nasal drip,  CV:  No chest pain, orthopnea, PND, swelling in lower extremities, anasarca, dizziness, palpitations GI  No heartburn, indigestion, abdominal pain, nausea, vomiting, diarrhea, change in bowel habits, loss of appetite Resp: +Shortness of breath with exertion, not at rest.   +Non-productive cough,  No coughing up of blood.  No change in color of mucus.  No wheezing.  Skin: . No rash GU:  MS: Psych:  No change in mood or affect. No depression or anxiety.  No memory loss.  Objective:   Physical Exam BP 138/84  Pulse 60  Ht 5\' 4"  (1.626 m)  Wt 289 lb (131.09 kg)  BMI 49.61 kg/m2  SpO2 93% General- Alert, Oriented, Affect-appropriate, Distress- none acute, +morbidly obese, wheelchair  She desaturated to 87% on arrival, but at rest is 93% on room air Skin-. Lymphadenopathy- none Head- atraumatic Eyes- Gross vision intact, PERRLA, conjunctivae clear secretions Ears- Hearing, canals, Tm normal Nose- Clear, No- Septal dev, mucus, polyps, erosion, perforation   Scar bridge of nose Throat- Mallampati II , mucosa clear , drainage- none, tonsils- atrophic Neck- flexible , trachea midline, no stridor , thyroid nl, carotid no bruit   Heart/CV- RRR , 1/6 systolic murmur right upper  sternal area , no gallop  , no rub, nl s1  - JVD- none , edema- none, stasis changes- none, varices- none  Lung- clear to P&A, no- cough ; wheeze- none,  , dullness-none, rub- none    Chest wall- R pacemaker Abd-  Br/ Gen/ Rectal- Not done, not indicated Extrem-

## 2011-12-25 LAB — D-DIMER, QUANTITATIVE: D-Dimer, Quant: 0.78 ug/mL-FEU — ABNORMAL HIGH (ref 0.00–0.48)

## 2011-12-28 ENCOUNTER — Encounter: Payer: Medicare Other | Admitting: Rehabilitation

## 2011-12-29 ENCOUNTER — Ambulatory Visit (INDEPENDENT_AMBULATORY_CARE_PROVIDER_SITE_OTHER): Payer: Medicaid Other | Admitting: Sports Medicine

## 2011-12-29 ENCOUNTER — Encounter: Payer: Self-pay | Admitting: Internal Medicine

## 2011-12-29 ENCOUNTER — Ambulatory Visit: Payer: Medicare Other | Admitting: Sports Medicine

## 2011-12-29 ENCOUNTER — Encounter: Payer: Self-pay | Admitting: Sports Medicine

## 2011-12-29 VITALS — BP 143/73 | HR 60 | Ht 64.0 in | Wt 289.0 lb

## 2011-12-29 DIAGNOSIS — M5412 Radiculopathy, cervical region: Secondary | ICD-10-CM

## 2011-12-29 DIAGNOSIS — M792 Neuralgia and neuritis, unspecified: Secondary | ICD-10-CM

## 2011-12-29 MED ORDER — PREGABALIN 200 MG PO CAPS: 200.0000 mg | ORAL_CAPSULE | Freq: Two times a day (BID) | ORAL | Status: DC

## 2011-12-29 NOTE — Progress Notes (Signed)
Quick Note:  Pt aware of results. No further questions. ______ 

## 2011-12-29 NOTE — Progress Notes (Signed)
  Subjective:    Patient ID: Melissa Villa, female    DOB: 1945/10/04, 66 y.o.   MRN: [id number]  HPI chief complaint: Followup on right arm pain  Patient comes in today for followup on right arm pain. Pain persists despite trying physical therapy. She is being treated for assumed cervical radiculopathy, cervical traction has not been helpful. Her pain is diffuse in the right arm beginning with the right shoulder and radiating into the right hand. She has numbness and tingling across the dorsum of her forearm and at times into her fingers. Some associated weakness as well. Symptoms initially began in the right elbow. She was given a cortisone injection for lateral epicondylitis, but that was ineffective. She is on 100 mg of Lyrica 3 times daily which does seem to be helping somewhat she is asking about the possibility of increasing that dose.    Review of Systems     Objective:   Physical Exam Patient sits comfortable in exam room in no acute distress. Awake alert and oriented x3 Cervical spine shows some tenderness along the right paraspinal musculature but a negative Spurlings. She does have pain with active motion of the in all planes but nothing focal. Rotator cuff strength is 5/5. Some tenderness to palpation over the lateral epicondyle at the right elbow. Neurological exam shows no focal deficits of either upper extremity. Good radial and ulnar pulses.        Assessment & Plan:  Persistent diffuse right arm pain, likely neuropathic in nature Insulin-dependent diabetes  In reviewing the chart, there has been some question in the past as to whether or not her elbow pain was related to radial tunnel syndrome. Her symptoms certainly sound neuropathic in nature and there may be an element of diabetic neuropathy present. I would like to order an EMG/nerve conduction study of the right upper extremity to rule out cervical radiculopathy, possible radial tunnel syndrome, and diabetic  neuropathy. In the meantime, I will increase her Lyrica to 200 mg twice a day. She will stop physical therapy since it has not been helpful. She will followup with me in the days following her EMG to go over those and delineate further treatment.

## 2011-12-29 NOTE — Progress Notes (Signed)
Quick Note:  LMTCB ______ 

## 2011-12-29 NOTE — Patient Instructions (Addendum)
DR IBAZEBO Monday SEPT 16TH AT 315PM [address] [phone] NERVE CONDUCTION STUDIES

## 2011-12-29 NOTE — Progress Notes (Signed)
Quick Note:  Pt aware of results. No further questions. ______

## 2011-12-30 ENCOUNTER — Encounter: Payer: Medicare Other | Admitting: Rehabilitation

## 2012-01-02 NOTE — Assessment & Plan Note (Signed)
She desaturated with exertion while arriving but recovered to 93% on room air at rest. There is probably a significant component of obesity hypoventilation syndrome but she may benefit from portable oxygen.

## 2012-01-02 NOTE — Assessment & Plan Note (Signed)
Good compliance and control. Weight loss would help substantially as discussed.

## 2012-01-02 NOTE — Assessment & Plan Note (Signed)
Previous partial resection for carcinoid. Significant obesity makes obesity hypoventilation syndrome likely.

## 2012-01-07 ENCOUNTER — Encounter: Payer: Self-pay | Admitting: *Deleted

## 2012-01-07 ENCOUNTER — Telehealth: Payer: Self-pay | Admitting: Sports Medicine

## 2012-01-07 NOTE — Telephone Encounter (Signed)
I spoke with Melissa Villa today on the phone regarding EMG/nerve conduction study results of her right upper extremity. 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 No evidence of cervical radiculopathy and no evidence of radial tunnel syndrome. Based on these findings I recommended referral to a hand and elbow specialist. I would defer further treatment to their discretion.

## 2012-01-07 NOTE — Patient Instructions (Signed)
DR Betha Loa [address] WED SEPT 25TH 9AM [phone]

## 2012-01-17 ENCOUNTER — Other Ambulatory Visit: Payer: Self-pay | Admitting: Internal Medicine

## 2012-01-18 ENCOUNTER — Other Ambulatory Visit: Payer: Self-pay | Admitting: Internal Medicine

## 2012-01-19 ENCOUNTER — Other Ambulatory Visit: Payer: Self-pay | Admitting: Radiology

## 2012-01-20 ENCOUNTER — Other Ambulatory Visit (HOSPITAL_COMMUNITY): Payer: Self-pay | Admitting: Radiology

## 2012-01-20 DIAGNOSIS — Z85118 Personal history of other malignant neoplasm of bronchus and lung: Secondary | ICD-10-CM

## 2012-01-20 DIAGNOSIS — C50919 Malignant neoplasm of unspecified site of unspecified female breast: Secondary | ICD-10-CM

## 2012-01-21 ENCOUNTER — Encounter: Payer: Self-pay | Admitting: Internal Medicine

## 2012-01-21 NOTE — Progress Notes (Signed)
Patient ID: Melissa Villa, female   DOB: 05-10-1945, 66 y.o.   MRN: [id number] Received documentation of a pathology report from breast biopsy on 01/19/12 showing evidence of invasive ductal carcinoma, grade 2 of the right breast.  Patient has an appointment with Dr. [name] on 01/28/12 to discuss this.

## 2012-01-24 ENCOUNTER — Other Ambulatory Visit (HOSPITAL_BASED_OUTPATIENT_CLINIC_OR_DEPARTMENT_OTHER): Payer: Medicare Other | Admitting: Lab

## 2012-01-24 ENCOUNTER — Other Ambulatory Visit: Payer: Self-pay | Admitting: *Deleted

## 2012-01-24 DIAGNOSIS — C50919 Malignant neoplasm of unspecified site of unspecified female breast: Secondary | ICD-10-CM

## 2012-01-24 DIAGNOSIS — O24919 Unspecified diabetes mellitus in pregnancy, unspecified trimester: Secondary | ICD-10-CM

## 2012-01-24 DIAGNOSIS — D3A090 Benign carcinoid tumor of the bronchus and lung: Secondary | ICD-10-CM

## 2012-01-24 LAB — COMPREHENSIVE METABOLIC PANEL (CC13)
ALT: 22 U/L (ref 0–55)
AST: 18 U/L (ref 5–34)
CO2: 21 mEq/L — ABNORMAL LOW (ref 22–29)
Calcium: 9 mg/dL (ref 8.4–10.4)
Chloride: 110 mEq/L — ABNORMAL HIGH (ref 98–107)
Creatinine: 1 mg/dL (ref 0.6–1.1)
Sodium: 141 mEq/L (ref 136–145)
Total Protein: 6.8 g/dL (ref 6.4–8.3)

## 2012-01-24 LAB — CBC WITH DIFFERENTIAL/PLATELET
Basophils Absolute: 0 10*3/uL (ref 0.0–0.1)
Eosinophils Absolute: 0.1 10*3/uL (ref 0.0–0.5)
HCT: 30.9 % — ABNORMAL LOW (ref 34.8–46.6)
LYMPH%: 17.6 % (ref 14.0–49.7)
MCV: 74.5 fL — ABNORMAL LOW (ref 79.5–101.0)
MONO%: 8.7 % (ref 0.0–14.0)
NEUT#: 5.4 10*3/uL (ref 1.5–6.5)
NEUT%: 72.4 % (ref 38.4–76.8)
Platelets: 213 10*3/uL (ref 145–400)
RBC: 4.15 10*6/uL (ref 3.70–5.45)

## 2012-01-24 LAB — HEMOGLOBIN A1C
Hgb A1c MFr Bld: 7 % — ABNORMAL HIGH (ref <5.7)
Mean Plasma Glucose: 154 mg/dL — ABNORMAL HIGH (ref <117)

## 2012-01-27 ENCOUNTER — Inpatient Hospital Stay (HOSPITAL_COMMUNITY): Admission: RE | Admit: 2012-01-27 | Payer: Medicare Other | Source: Ambulatory Visit

## 2012-01-27 ENCOUNTER — Ambulatory Visit (HOSPITAL_COMMUNITY)
Admission: RE | Admit: 2012-01-27 | Discharge: 2012-01-27 | Disposition: A | Discharge status: Home / Self Care | Payer: Medicare Other | Source: Ambulatory Visit | Attending: Radiology | Admitting: Radiology

## 2012-01-27 DIAGNOSIS — C50919 Malignant neoplasm of unspecified site of unspecified female breast: Secondary | ICD-10-CM | POA: Insufficient documentation

## 2012-01-27 DIAGNOSIS — J4489 Other specified chronic obstructive pulmonary disease: Secondary | ICD-10-CM | POA: Insufficient documentation

## 2012-01-27 DIAGNOSIS — R599 Enlarged lymph nodes, unspecified: Secondary | ICD-10-CM | POA: Insufficient documentation

## 2012-01-27 DIAGNOSIS — Z950 Presence of cardiac pacemaker: Secondary | ICD-10-CM | POA: Insufficient documentation

## 2012-01-27 DIAGNOSIS — J449 Chronic obstructive pulmonary disease, unspecified: Secondary | ICD-10-CM | POA: Insufficient documentation

## 2012-01-27 DIAGNOSIS — E279 Disorder of adrenal gland, unspecified: Secondary | ICD-10-CM | POA: Insufficient documentation

## 2012-01-27 DIAGNOSIS — Z923 Personal history of irradiation: Secondary | ICD-10-CM | POA: Insufficient documentation

## 2012-01-27 MED ORDER — IOHEXOL 300 MG/ML  SOLN
80.0000 mL | Freq: Once | INTRAMUSCULAR | Status: AC | PRN
  Administered 2012-01-27: 80 mL via INTRAVENOUS

## 2012-01-28 ENCOUNTER — Ambulatory Visit (HOSPITAL_BASED_OUTPATIENT_CLINIC_OR_DEPARTMENT_OTHER): Payer: Medicare Other | Admitting: Oncology

## 2012-01-28 ENCOUNTER — Telehealth: Payer: Self-pay | Admitting: *Deleted

## 2012-01-28 VITALS — BP 117/97 | HR 54 | Temp 98.4°F | Resp 26 | Ht 64.0 in | Wt 290.0 lb

## 2012-01-28 DIAGNOSIS — R0602 Shortness of breath: Secondary | ICD-10-CM

## 2012-01-28 DIAGNOSIS — Z853 Personal history of malignant neoplasm of breast: Secondary | ICD-10-CM

## 2012-01-28 DIAGNOSIS — D381 Neoplasm of uncertain behavior of trachea, bronchus and lung: Secondary | ICD-10-CM

## 2012-01-28 DIAGNOSIS — C50919 Malignant neoplasm of unspecified site of unspecified female breast: Secondary | ICD-10-CM

## 2012-01-28 DIAGNOSIS — Z5111 Encounter for antineoplastic chemotherapy: Secondary | ICD-10-CM

## 2012-01-28 MED ORDER — FULVESTRANT 250 MG/5ML IM SOLN
500.0000 mg | INTRAMUSCULAR | Status: DC
  Administered 2012-01-28: 500 mg via INTRAMUSCULAR
  Filled 2012-01-28: qty 10

## 2012-01-28 NOTE — Progress Notes (Signed)
ID: Melissa Villa   DOB: Aug 30, 1945  MR#: [id number]  UXL#:[id number]  PCP: Melissa Ly, MD GYN:  SU:  OTHER MD: Melissa Villa   HISTORY OF PRESENT ILLNESS: Melissa Villa has a history of remote bilateral breast cancer, as noted below. More recently, on 01/19/2012, she presented for routine screening mammography, but it was found that her right breast had shrunk considerably, with increased density. It was difficult to pull away the breast from the chest wall for imaging.  Accordingly an ultrasound was obtained this showed a 3.5 cm irregular microlobulated mass parallel to the chest wall. There was no increased Doppler activity. He was difficult to tell whether this was recurrent tumor or progressive scarring. Biopsy on the same day showed ([id number]) an invasive ductal carcinoma, grade 2, estrogen receptor 100% Melissa progesterone receptor 65% positive, with an MIB-1 of 59%. HER-2 studies are pending.   INTERVAL HISTORY: Melissa Villa returns today to discuss her new problem. She has been waiting to hear regarding bariatric surgery. As far as the right breast is concerned, there has been some "oozing" Melissa her skin has been very dry. She has been using lotions which have not helped. She did not think this was cancer, but changes related to her prior radiation. That is why she just waited until her routine mammography time came up.  REVIEW OF SYSTEMS: She can barely walk across the room before feeling short of breath. She describes herself is moderately fatigued. She has a dry cough. She denies unusual headaches, nausea, vomiting, uncontrolled pain, or change in bowel or bladder habits. She's not aware of any other skin areas of concern. The detailed review of systems was otherwise noncontributory.   PAST MEDICAL HISTORY: Past Medical History  Diagnosis Date  . CHF (congestive heart failure)      2-D echo 02/19/2009 showed left ventricle cavity size normal; systolic function normal,  estimated ejection fraction 55%; wall motion normal; no regional wall motion abnormalities; PA peak pressure 47mm Hg.  [name] COPD (chronic obstructive pulmonary disease)     Pulmonary function tests on 08/05/2010 showed mixed obstructive Melissa restrictive lung disease with no significant response to bronchodilator, Melissa decreased diffusion capacity that corrects to normal range when adjusted for the inhaled alveolar volume.  . Hypertension   . TIA (transient ischemic attack)   . Anemia   . Breast cancer     S/P left lumpectomy Melissa axillary lymph node dissection December 1992 for a T1 N0 medullary breast cancer, no lymph node involvement, treated with tamoxifen for 2 years; S/P right lumpectomy Melissa axillary lymph node dissection August 1995 for a T1 N0 microinvasive breast cancer, treated with Arimidex for 5 years.  Patient is followed by Melissa Villa at the cancer center.  . Degenerative joint disease   . AV block, complete     S/P dual-chamber for symptomatic episodes of bradycardia Melissa complete heart block.  . Pacemaker     S/P dual-chamber for symptomatic episodes of bradycardia Melissa complete heart block.  . Adenomatous polyp of colon     Tubular adenomas X 2 found 06/1999; negative screening colonoscopy 02/13/2004 by Dr. Danise Villa.  . Hypercholesteremia   . Obstructive sleep apnea     Baseline diagnostic nocturnal polysomnogram on July 27, 2005 showed severe obstructive sleep apnea/hypopnea syndrome, with AHI 153.2 per hour.  Last nocturnal polysomnogram done for CPAP titration was 05/01/2009; CPAP was titrated to 13 CWP, AHI 1.8 per hour using a large Respironics FitLife Full Face  Mask with heated humidifier.  . Peripheral autonomic neuropathy due to diabetes mellitus   . 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 Karle Villa  . Constipation   . Hand pain   . Palpitation   . Rib pain     right sided   . DM type 2 (diabetes mellitus, type 2)   . Skin rash     . Urinary incontinence   . Proliferative diabetic retinopathy     S/P panretinal photocoagulation by Dr. Fawn Villa  . Vitreous hemorrhage     With tractional detachment, left eye; S/P posterior vitrectomy with membrane peel by Dr. Fawn Villa 04/06/2005  . Dyspnea on exertion     Cardiac cath 06/17/2005 by Dr. Sharyn Villa showed LVEF 50-55%, 30% proximal LAD stenosis, small diagonals.   . Shingles rash 03/25/11    PAST SURGICAL HISTORY: Past Surgical History  Procedure Date  . Mastectomy partial / lumpectomy w/ axillary lymphadenectomy 09/25/90, Sep 24, 1993    S/P left lumpectomy Melissa axillary lymph node dissection December 1992 for a T1 N0 medullary breast cancer, no lymph node involvement, treated with tamoxifen for 2 years; S/P right lumpectomy Melissa axillary lymph node dissection August 1995 for a T1 N0 microinvasive breast cancer, treated with Arimidex for 5 years.  Patient is followed by Melissa Villa at the cancer center.  . Lung lobectomy 06/13/2001    S/P right lower lobe lobectomy 06/13/2001 by Dr. Karle Villa  . Pacemaker insertion 04/20/2001  . Cholecystectomy   . Tonsillectomy     FAMILY HISTORY Family History  Problem Relation Age of Onset  . Breast cancer Mother 55    Deceased 25-Sep-2002  . Diabetes type II Mother   . Diabetes type II Brother   . Hypertension Brother   . Heart attack Brother   . Stroke Brother   . Hypertension Brother   . Hypertension Sister   . Cervical cancer Neg Hx   . Colon cancer Neg Hx    the patient has no information regarding her father. Her mother died at the age of 12 with end-stage renal disease. She had been diagnosed with breast cancer in her mid 75s. Melissa Villa had 4 brothers Melissa 3 sisters. One brother died from a drug overdose Melissa one from AIDS. One sister died from EtOH abuse. There is no other history of breast cancer in the family to her knowledge.  GYNECOLOGIC HISTORY: She is GX P3, first live birth age 12. She stopped having periods in the  1990s.  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 She was widowed in September 24, 2005. She lives by herself. Her 3 children live in [place]. When asked who she would call an emergency, she says "Villa would have to think about it; we are not close". She has 9 grandchildren, but is only in touch with 3. She attends a [employer].   ADVANCED DIRECTIVES:  HEALTH MAINTENANCE: History  Substance Use Topics  . Smoking status: Former Smoker -- 1.0 packs/day for 15 years    Types: Cigarettes    Quit date: 04/19/2001  . Smokeless tobacco: Never Used  . Alcohol Use: No     Colonoscopy:  PAP:  Bone density:  Lipid panel:  No Known Allergies  Current Outpatient Prescriptions  Medication Sig Dispense Refill  . albuterol (PROAIR HFA) 108 (90 BASE) MCG/ACT inhaler Inhale 2 puffs into the lungs 4 (four) times daily as needed for wheezing  or shortness of breath.  1 Inhaler  11  . amLODipine (NORVASC) 10 MG tablet Take 1 tablet (10 mg total) by mouth daily.  31 tablet  11  . Calcium Carbonate-Vitamin D (CALCIUM 600+D) 600-400 MG-UNIT per tablet Take 1 tablet by mouth 2 (two) times daily.        . carvedilol (COREG) 12.5 MG tablet Take 2 tablets (25 mg total) by mouth 2 (two) times daily.  120 tablet  6  . cetirizine (ZYRTEC) 10 MG tablet Take 1 tablet (10 mg total) by mouth daily. For seasonal allergies.  30 tablet  11  . Dexlansoprazole (DEXILANT) 60 MG capsule Take 60 mg by mouth every morning. Take 15 minutes before breakfast.      . dipyridamole-aspirin (AGGRENOX) 25-200 MG per 12 hr capsule Take 1 capsule by mouth 2 (two) times daily.  62 each  11  . Fluticasone-Salmeterol (ADVAIR DISKUS) 250-50 MCG/DOSE AEPB Inhale 1 puff into the lungs 2 (two) times daily.  60 each  11  . glucose blood (ACCU-CHEK AVIVA PLUS) test strip 1 each by Other route as needed. Use as to check blood sugar up to 3 times daily as instructed DX code:250.00      . HYDROcodone-acetaminophen  (NORCO/VICODIN) 5-325 MG per tablet Take one Melissa one-half tablets four times a day as needed for pain.  180 tablet  3  . insulin glargine (LANTUS) 100 UNIT/ML injection Inject 42 Units into the skin daily.  15 mL  5  . Insulin Pen Needle (B-D ULTRAFINE III SHORT PEN) 31G X 8 MM MISC Use as directed for insulin injections.  100 each  3  . Lancets Misc. (ACCU-CHEK FASTCLIX LANCET) KIT by Does not apply route. Use as to check blood sugar up to 3 times daily as instructed DX code:250.00       . metFORMIN (GLUCOPHAGE) 850 MG tablet Take 1 tablet (850 mg total) by mouth 3 (three) times daily.  90 tablet  6  . multivitamin (THERAGRAN) per tablet Take 1 tablet by mouth daily.        . pregabalin (LYRICA) 100 MG capsule Take 1 capsule (100 mg total) by mouth 3 (three) times daily.  90 capsule  2  . pregabalin (LYRICA) 200 MG capsule Take 1 capsule (200 mg total) by mouth 2 (two) times daily.  60 capsule  1  . rosuvastatin (CRESTOR) 5 MG tablet Take 2 tablets (10 mg total) by mouth daily.  60 tablet  6  . sitaGLIPtin (JANUVIA) 100 MG tablet Take 1 tablet (100 mg total) by mouth daily.  30 tablet  6  . tiotropium (SPIRIVA) 18 MCG inhalation capsule Place 1 capsule (18 mcg total) into inhaler Melissa inhale daily.  30 capsule  11  . traMADol (ULTRAM) 50 MG tablet Take 1 tablet (50 mg total) by mouth every 6 (six) hours as needed for pain (cough).  50 tablet  0  . triamcinolone cream (KENALOG) 0.1 % Apply a thin layer topically to affected areas on leg twice a day for 2 weeks.  30 g  1  . valsartan (DIOVAN) 80 MG tablet Take 2 tablets (160 mg total) by mouth 2 (two) times daily.  120 tablet  6    OBJECTIVE: Middle-aged [place] woman wearing an oxygen cannula   Filed Vitals:   01/28/12 1436  BP: 117/97  Pulse: 54  Temp: 98.4 F (36.9 C)  Resp: 26     Body mass index is 49.78 kg/(m^2).  ECOG FS: 2  Sclerae unicteric Oropharynx clear No cervical or supraclavicular adenopathy Lungs no rales or  rhonchi Heart regular rate Melissa rhythm Abd benign MSK no focal spinal tenderness Neuro: nonfocal Breasts: The right breast is status post remote lumpectomy Melissa radiation. There is an area of induration extending over most of the breast Melissa beyond the breast itself into the surrounding chest wall, which is consistent with skin spread of the cancer. This area was photographed today. I do not palpate any axillary adenopathy. The left breast is unremarkable.  LAB RESULTS: Lab Results  Component Value Date   WBC 7.5 01/24/2012   NEUTROABS 5.4 01/24/2012   HGB 9.3* 01/24/2012   HCT 30.9* 01/24/2012   MCV 74.5* 01/24/2012   PLT 213 01/24/2012      Chemistry      Component Value Date/Time   NA 141 01/24/2012 1002   NA 142 12/24/2011 1435   K 4.6 01/24/2012 1002   K 5.1 12/24/2011 1435   CL 110* 01/24/2012 1002   CL 111 12/24/2011 1435   CO2 21* 01/24/2012 1002   CO2 22 12/24/2011 1435   BUN 16.0 01/24/2012 1002   BUN 22 12/24/2011 1435   CREATININE 1.0 01/24/2012 1002   CREATININE 0.8 12/24/2011 1435   CREATININE 0.92 09/30/2011 0922      Component Value Date/Time   CALCIUM 9.0 01/24/2012 1002   CALCIUM 9.0 12/24/2011 1435   ALKPHOS 60 01/24/2012 1002   ALKPHOS 56 09/30/2011 0922   AST 18 01/24/2012 1002   AST 18 09/30/2011 0922   ALT 22 01/24/2012 1002   ALT 15 09/30/2011 0922   BILITOT 0.30 01/24/2012 1002   BILITOT 0.2* 09/30/2011 0922       Lab Results  Component Value Date   LABCA2 18 06/26/2007    No components found with this basename: [id number]    No results found for this basename: INR:1;PROTIME:1 in the last 168 hours  Urinalysis    Component Value Date/Time   COLORURINE YELLOW 03/30/2007 2040   APPEARANCEUR CLEAR 03/30/2007 2040   LABSPEC 1.028 03/30/2007 2040   PHURINE 5.5 03/30/2007 2040   BILIRUBINUR NEG 03/30/2007 2040   KETONESUR NEG mg/dL 47/82/9562 1308   PROTEINUR 30* 03/30/2007 2040   UROBILINOGEN 0.2 03/30/2007 2040   NITRITE NEG 03/30/2007 2040   LEUKOCYTESUR NEG  03/30/2007 2040    STUDIES: Ct Chest W Contrast  01/27/2012  *RADIOLOGY REPORT*  Clinical Data: Recently diagnosed right breast carcinoma.  Previous breast Melissa lung carcinoma.  Previous surgery Melissa radiation therapy. COPD.  CT CHEST WITH CONTRAST  Technique:  Multidetector CT imaging of the chest was performed following the standard protocol during bolus administration of intravenous contrast.  Contrast: 80mL OMNIPAQUE IOHEXOL 300 MG/ML  SOLN  Comparison: None.  Findings: There is asymmetric soft tissue density seen in the right breast, consistent with the patient's recent diagnosis of right breast carcinoma.  No deep chest wall invasion demonstrated.  No evidence of axillary lymphadenopathy.  Surgical clips are seen in both axillary regions.  Transvenous pacemaker is also seen in the right anterior chest wall.  Mild mediastinal lymphadenopathy is seen in the prevascular Melissa lateral aortic regions, the aorta pulmonary window, right paratracheal region Melissa subcarinal region.  The largest confluent area of adenopathy in the right paratracheal region measures 2.3 x 3.0 cm.  There is mild bilateral hilar lymphadenopathy.  Chronic postsurgical changes are seen from previous right lower lobectomy.  No suspicious pulmonary nodules or masses are identified.  There is no evidence of pulmonary infiltrate or central endobronchial lesion.  No evidence of pleural or pericardial effusion.  A small right adrenal mass is seen measuring 1.4 x 2.3 cm.  This has attenuation value of 28 HU on this contrast-enhanced study, which is indeterminate.  No other mass or adenopathy seen with the visualized portion of upper abdomen.  No suspicious bone lesions are identified.  IMPRESSION:  1.  Mild mediastinal Melissa bilateral hilar lymphadenopathy, consistent with metastatic disease. 2.  No evidence of axillary lymphadenopathy or pulmonary metastases. 3.  2.3 cm indeterminate right adrenal mass.  PET CT scan or abdomen MRI is recommended  for further evaluation.  This recommendation follows ACR consensus guidelines:  Managing Incidental Findings on Abdominal CT:  White Paper of the ACR Incidental Findings Committee.  J Am Coll Radiol 4540;9:811-914   Original Report Authenticated By: Danae Orleans, M.D.     ASSESSMENT: 66 y.o. [place] woman status post -  1. Left lumpectomy Melissa axillary lymph node dissection December 1992 for a T1N0 medullary breast cancer treated with tamoxifen for 2 years. 2. Status post right lumpectomy Melissa axillary lymph node dissection August 1995 for a T1N0 microinvasive breast cancer treated with Arimidex for 5 years. 3. Status post right lower lobe lobectomy February 2003 for a 5 mm lung carcinoid. 4. Right breast biopsy 01/19/2012 shows an invasive ductal carcinoma, grade 2, estrogen Melissa progesterone receptor positive, with an elevated MIB-1-1, HER-2 status pending. Clinically the tumor extends over most of the breast Melissa into the surrounding chest wall skin.   PLAN: Villa am not sure Carola is going to be able to have definitive surgery for this tumor, but she will still need referral to a surgeon for biopsy of one of the outlying chest wall areas. She has seen Dr. Chipper Herb in the past Melissa Villa will ask him to see her sometime late next week, after her case iss presented at the multidisciplinary breast cancer conference on 02/01/2022, to consider whether further radiation therapy is at all possible, given the fact that this breast has a ready been irradiated. In the meantime the good news is that the chest CT scan does not show evidence of metastatic spread. There is a bone scan pending.  Villa have discussed treatment options with her, Melissa Villa am starting her on fulvestrant today. She will return in 2 weeks Melissa 4 weeks for next 2 shots Melissa then we will do that on a monthly basis. Villa am hopeful to be able to control this tumor with anti-estrogen therapy, but of course we have many chemotherapy options if hormone  manipulations fail.   Benett Swoyer C    01/28/2012

## 2012-01-28 NOTE — Telephone Encounter (Signed)
Made patient appointment with dr.murray on 02-02-2012  Gave patient appointment for injections on 02-11-2012 and 02-25-2012

## 2012-02-01 ENCOUNTER — Encounter: Payer: Self-pay | Admitting: *Deleted

## 2012-02-01 DIAGNOSIS — C50919 Malignant neoplasm of unspecified site of unspecified female breast: Secondary | ICD-10-CM | POA: Insufficient documentation

## 2012-02-02 ENCOUNTER — Ambulatory Visit
Admission: RE | Admit: 2012-02-02 | Discharge: 2012-02-02 | Disposition: A | Discharge status: Home / Self Care | Payer: Medicare Other | Source: Ambulatory Visit | Attending: Radiation Oncology | Admitting: Radiation Oncology

## 2012-02-02 ENCOUNTER — Encounter: Payer: Self-pay | Admitting: Radiation Oncology

## 2012-02-02 ENCOUNTER — Ambulatory Visit: Payer: Medicare Other | Admitting: Internal Medicine

## 2012-02-02 VITALS — BP 149/63 | HR 60 | Temp 98.6°F | Resp 24 | Wt 291.8 lb

## 2012-02-02 DIAGNOSIS — G4733 Obstructive sleep apnea (adult) (pediatric): Secondary | ICD-10-CM | POA: Insufficient documentation

## 2012-02-02 DIAGNOSIS — J4489 Other specified chronic obstructive pulmonary disease: Secondary | ICD-10-CM | POA: Insufficient documentation

## 2012-02-02 DIAGNOSIS — J449 Chronic obstructive pulmonary disease, unspecified: Secondary | ICD-10-CM | POA: Insufficient documentation

## 2012-02-02 DIAGNOSIS — Z950 Presence of cardiac pacemaker: Secondary | ICD-10-CM | POA: Insufficient documentation

## 2012-02-02 DIAGNOSIS — E1142 Type 2 diabetes mellitus with diabetic polyneuropathy: Secondary | ICD-10-CM | POA: Insufficient documentation

## 2012-02-02 DIAGNOSIS — I10 Essential (primary) hypertension: Secondary | ICD-10-CM | POA: Insufficient documentation

## 2012-02-02 DIAGNOSIS — E1149 Type 2 diabetes mellitus with other diabetic neurological complication: Secondary | ICD-10-CM | POA: Insufficient documentation

## 2012-02-02 DIAGNOSIS — C50919 Malignant neoplasm of unspecified site of unspecified female breast: Secondary | ICD-10-CM

## 2012-02-02 DIAGNOSIS — E7800 Pure hypercholesterolemia, unspecified: Secondary | ICD-10-CM | POA: Insufficient documentation

## 2012-02-02 DIAGNOSIS — E1139 Type 2 diabetes mellitus with other diabetic ophthalmic complication: Secondary | ICD-10-CM | POA: Insufficient documentation

## 2012-02-02 DIAGNOSIS — E11319 Type 2 diabetes mellitus with unspecified diabetic retinopathy without macular edema: Secondary | ICD-10-CM | POA: Insufficient documentation

## 2012-02-02 DIAGNOSIS — Z79899 Other long term (current) drug therapy: Secondary | ICD-10-CM | POA: Insufficient documentation

## 2012-02-02 DIAGNOSIS — Z794 Long term (current) use of insulin: Secondary | ICD-10-CM | POA: Insufficient documentation

## 2012-02-02 DIAGNOSIS — Z8673 Personal history of transient ischemic attack (TIA), and cerebral infarction without residual deficits: Secondary | ICD-10-CM | POA: Insufficient documentation

## 2012-02-02 HISTORY — DX: Bronchitis, not specified as acute or chronic: J40

## 2012-02-02 HISTORY — DX: Unspecified asthma, uncomplicated: J45.909

## 2012-02-02 HISTORY — DX: Unspecified cataract: H26.9

## 2012-02-02 HISTORY — DX: Allergy, unspecified, initial encounter: T78.40XA

## 2012-02-02 NOTE — Progress Notes (Signed)
New consult patient HX Cancer  right and left breast lumpectomies in 1992& 1994, Radiation done [place] with Dr.Murray New biopsy=01/19/12 Right breast=invasive ductal carcinoma, ER?PR= positive Alert,oriented x3, very short of breath with walking   with oxygen 2 liters n/c, , a little pain where nipple is, small drainage there,inverted  Widowed, 3 sons,   Allergies:NKDa

## 2012-02-02 NOTE — Addendum Note (Signed)
Encounter addended by: Maryln Gottron, MD on: 02/02/2012  5:00 PM<BR>     Documentation filed: Notes Section

## 2012-02-02 NOTE — Progress Notes (Addendum)
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 female who is seen today to request of Dr. [name] for evaluation of her recurrent carcinoma the right breast. Her previous radiation therapy details not available the time this dictation, but understand that she underwent a left partial mastectomy and axillary node dissection in December 1992 for T1 N0 medullary carcinoma, treated by tamoxifen for 2 years. In 1995 she underwent a right lumpectomy and axillar node dissection for T1 N0 microinvasive breast cancer and treated with adjuvant Arimidex for 5 years after radiation therapy. Approximately 7 months ago she noted thickening of her right breast with "shrinkage" of her breast. This progressed and she was seen for mammography on 01/19/2012. On ultrasound was found have a 3.5 cm irregular microlobulated mass. She is also noted to have erythematous crusting of her skin with retraction of the nipple. A needle core biopsy on 01/19/2012 was diagnostic for invasive ductal carcinoma which was ER positive at 100% and PR positive at 65% with a proliferation marker of 59%. She was seen by Dr. [name] for review in October 11 a. He reports a surgical consultation, and also reevaluation here for possible radiation therapy. A staging CT scan from October 3 showed mild mediastinal and bilateral hilar lymphadenopathy consistent with metastatic disease. There also appear to be a 2.3 cm indeterminate mass along the right adrenal gland. A bone scan is pending. She is most concerned about been delayed for bariatric surgery to  help her with her respiratory status.  PREVIOUS RADIATION THERAPY: Bilateral breast radiation as mentioned above. Records have been requested and should be available by later today or tomorrow.   PAST MEDICAL HISTORY:  has a past medical history of CHF (congestive heart failure); COPD (chronic obstructive pulmonary disease); Hypertension; TIA (transient ischemic attack); Anemia; Degenerative joint disease; AV block, complete; Pacemaker; Adenomatous polyp of colon; Hypercholesteremia; Obstructive sleep apnea; Peripheral autonomic neuropathy due to diabetes mellitus; Carcinoid tumor; Constipation; Hand pain; Palpitation; Rib pain; DM type 2 (diabetes mellitus, type 2); Skin rash; Urinary incontinence; Proliferative diabetic retinopathy(362.02); Vitreous hemorrhage; Dyspnea on exertion; Shingles rash (03/25/11); Breast cancer; Breast cancer, stage 3 (102/13); Allergy; Asthma; Bronchitis; and Cataract.     PAST SURGICAL HISTORY:  Past Surgical History  Procedure Date  . 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  Patient is followed by Dr. [name] at the cancer center.  . Lung lobectomy 06/13/2001    S/P right lower lobe lobectomy 06/13/2001 by Dr. [name]  . Pacemaker insertion 04/20/2001  . Cholecystectomy   . Tonsillectomy      FAMILY HISTORY: family history includes Breast cancer (age of onset:45) in her mother; Diabetes type II in her brother and mother; Heart attack in her  brother; Hypertension in her brothers and sister; and Stroke in her brother.  There is no history of Cervical cancer and Colon cancer.   SOCIAL HISTORY:  reports that she quit  smoking about 10 years ago. Her smoking use included Cigarettes. She has a 15 pack-year smoking history. She has never used smokeless tobacco. She reports that she does not drink alcohol or use illicit drugs.   ALLERGIES: Review of patient's allergies indicates no known allergies.   MEDICATIONS:  Current Outpatient Prescriptions  Medication Sig Dispense Refill  . albuterol (PROAIR HFA) 108 (90 BASE) MCG/ACT inhaler Inhale 2 puffs into the lungs 4 (four) times daily as needed for wheezing or shortness of breath.  1 Inhaler  11  . amLODipine (NORVASC) 10 MG tablet Take 1 tablet (10 mg total) by mouth daily.  31 tablet  11  . Calcium Carbonate-Vitamin D (CALCIUM 600+D) 600-400 MG-UNIT per tablet Take 1 tablet by mouth 2 (two) times daily.        . carvedilol (COREG) 12.5 MG tablet Take 2 tablets (25 mg total) by mouth 2 (two) times daily.  120 tablet  6  . cetirizine (ZYRTEC) 10 MG tablet Take 1 tablet (10 mg total) by mouth daily. For seasonal allergies.  30 tablet  11  . Dexlansoprazole (DEXILANT) 60 MG capsule Take 60 mg by mouth every morning. Take 15 minutes before breakfast.      . dipyridamole-aspirin (AGGRENOX) 25-200 MG per 12 hr capsule Take 1 capsule by mouth 2 (two) times daily.  62 each  11  . Fluticasone-Salmeterol (ADVAIR DISKUS) 250-50 MCG/DOSE AEPB Inhale 1 puff into the lungs 2 (two) times daily.  60 each  11  . glucose blood (ACCU-CHEK AVIVA PLUS) test strip 1 each by Other route as needed. Use as to check blood sugar up to 3 times daily as instructed DX code:250.00      . HYDROcodone-acetaminophen (NORCO/VICODIN) 5-325 MG per tablet Take one and one-half tablets four times a day as needed for pain.  180 tablet  3  . insulin glargine (LANTUS) 100 UNIT/ML injection Inject 42 Units into the skin daily.  15 mL  5  . Insulin Pen Needle (B-D ULTRAFINE III SHORT PEN) 31G X 8 MM MISC Use as directed for insulin injections.  100 each  3  . Lancets Misc. (ACCU-CHEK FASTCLIX LANCET) KIT  by Does not apply route. Use as to check blood sugar up to 3 times daily as instructed DX code:250.00       . metFORMIN (GLUCOPHAGE) 850 MG tablet Take 1 tablet (850 mg total) by mouth 3 (three) times daily.  90 tablet  6  . multivitamin (THERAGRAN) per tablet Take 1 tablet by mouth daily.        . pregabalin (LYRICA) 100 MG capsule Take 1 capsule (100 mg total) by mouth 3 (three) times daily.  90 capsule  2  . pregabalin (LYRICA) 200 MG capsule Take 1 capsule (200 mg total) by mouth 2 (two) times daily.  60 capsule  1  . rosuvastatin (CRESTOR) 5 MG tablet Take 2 tablets (10 mg total) by mouth daily.  60 tablet  6  . sitaGLIPtin (JANUVIA) 100 MG tablet Take 1 tablet (100 mg total) by mouth daily.  30 tablet  6  . tiotropium (SPIRIVA) 18 MCG inhalation capsule Place 1 capsule (18 mcg total) into inhaler and inhale daily.  30 capsule  11  . traMADol (ULTRAM) 50 MG tablet Take 1 tablet (50 mg total) by  mouth every 6 (six) hours as needed for pain (cough).  50 tablet  0  . triamcinolone cream (KENALOG) 0.1 % Apply a thin layer topically to affected areas on leg twice a day for 2 weeks.  30 g  1  . valsartan (DIOVAN) 80 MG tablet Take 2 tablets (160 mg total) by mouth 2 (two) times daily.  120 tablet  6   No current facility-administered medications for this encounter.   Facility-Administered Medications Ordered in Other Encounters  Medication Dose Route Frequency Provider Last Rate Last Dose  . DISCONTD: fulvestrant (FASLODEX) injection 500 mg  500 mg Intramuscular Q14 Days [name], MD   500 mg at 01/28/12 1545     REVIEW OF SYSTEMS:  Pertinent items are noted in HPI.    PHYSICAL EXAM:  weight is 291 lb 12.8 oz (132.36 kg). Her oral temperature is 98.6 F (37 C). Her blood pressure is 149/63 and her pulse is 60. Her respiration is 24 and oxygen saturation is 92%.   Head and neck examination: Grossly unremarkable. She is on nasal O2. Nodes: Without palpable cervical, supraclavicular,  or axillary lymphadenopathy. Chest: Breath sounds distant. She does have a right upper anterior chest pacemaker. The breasts there is plaque like erythematous induration set along the right central breast with satellite lesions extending to the left across the midline. Some areas are slightly crusted. The central breast is indurated medley there is slight fixation to the chest wall. The right nipple is inverted. Left breast without masses or lesions. Heart regular in rhythm. Abdomen markedly obese. Extremities without upper extremity lymphedema. She does have a splint on her right wrist. Neurologic examination: Grossly nonfocal.   LABORATORY DATA:  Lab Results  Component Value Date   WBC 7.5 01/24/2012   HGB 9.3* 01/24/2012   HCT 30.9* 01/24/2012   MCV 74.5* 01/24/2012   PLT 213 01/24/2012   Lab Results  Component Value Date   NA 141 01/24/2012   K 4.6 01/24/2012   CL 110* 01/24/2012   CO2 21* 01/24/2012   Lab Results  Component Value Date   ALT 22 01/24/2012   AST 18 01/24/2012   ALKPHOS 60 01/24/2012   BILITOT 0.30 01/24/2012      IMPRESSION: Stage IV invasive ductal carcinoma right breast. I am in the process of obtaining her previous radiation therapy records for review. Her right-sided breast cancer was almost 20 years ago, and this may represent a new breast primary. In any case, she is not a candidate for further radiation therapy to her right breast/chest wall. Theoretically she could receive up to 2000 cGy, but I do not feel that this would be  significant clinical benefit. Furthermore, she probably need to have her right upper chest pacemaker transferred to the left side if she were to have radiation therapy. She saw receptor positive, and hopefully she'll respond to the next hormone maneuver. Dr. [name] will complete her staging workup with a bone scan pending.   PLAN: As discussed above.   I spent 40 minutes face to face with the patient and more than 50% of that time was spent in  counseling and/or coordination of care.  Addendum: I reviewed patient's treatment summary from October 1995 for her intraductal carcinoma the right breast with focal microinvasion (T1 N0) carcinoma of the breast. She was treated with right breast tangents to a dose of 5040 cGy in that her tumor bed was boosted for a further 1000 cGy in 5 sessions, completing all  therapy in 02/12/1994. Of note is that she had a right axillary dissection and all 20 lymph nodes were negative for metastatic disease.

## 2012-02-04 NOTE — Addendum Note (Signed)
Encounter addended by: Lowella Petties, RN on: 02/04/2012  2:37 PM<BR>     Documentation filed: Charges VN

## 2012-02-07 ENCOUNTER — Encounter (HOSPITAL_COMMUNITY)
Admission: RE | Admit: 2012-02-07 | Discharge: 2012-02-07 | Disposition: A | Discharge status: Home / Self Care | Payer: Medicare Other | Source: Ambulatory Visit | Attending: Radiology | Admitting: Radiology

## 2012-02-07 DIAGNOSIS — C50919 Malignant neoplasm of unspecified site of unspecified female breast: Secondary | ICD-10-CM | POA: Insufficient documentation

## 2012-02-07 DIAGNOSIS — Z85118 Personal history of other malignant neoplasm of bronchus and lung: Secondary | ICD-10-CM

## 2012-02-07 MED ORDER — TECHNETIUM TC 99M MEDRONATE IV KIT
24.0000 | PACK | Freq: Once | INTRAVENOUS | Status: AC | PRN
  Administered 2012-02-07: 24 via INTRAVENOUS

## 2012-02-07 NOTE — Addendum Note (Signed)
Encounter addended by: Lowella Petties, RN on: 02/07/2012  9:48 AM<BR>     Documentation filed: Charges VN

## 2012-02-07 NOTE — Addendum Note (Signed)
Encounter addended by: Lowella Petties, RN on: 02/07/2012 10:56 AM<BR>     Documentation filed: Charges VN

## 2012-02-10 ENCOUNTER — Telehealth: Payer: Self-pay | Admitting: Internal Medicine

## 2012-02-10 DIAGNOSIS — J984 Other disorders of lung: Secondary | ICD-10-CM

## 2012-02-10 NOTE — Telephone Encounter (Signed)
Spoke with pt. She is requesting order for more light wt portable o2 tank. Having trouble with carrying her current tanks Order was sent to [facility] for this Nothing further needed per pt

## 2012-02-11 ENCOUNTER — Ambulatory Visit (HOSPITAL_BASED_OUTPATIENT_CLINIC_OR_DEPARTMENT_OTHER): Payer: Medicare Other

## 2012-02-11 ENCOUNTER — Other Ambulatory Visit: Payer: Self-pay | Admitting: *Deleted

## 2012-02-11 VITALS — BP 132/62 | HR 65 | Temp 97.6°F

## 2012-02-11 DIAGNOSIS — C50919 Malignant neoplasm of unspecified site of unspecified female breast: Secondary | ICD-10-CM

## 2012-02-11 DIAGNOSIS — Z5111 Encounter for antineoplastic chemotherapy: Secondary | ICD-10-CM

## 2012-02-11 MED ORDER — HYDROCODONE-ACETAMINOPHEN 10-500 MG PO TABS: 1.0000 | ORAL_TABLET | Freq: Four times a day (QID) | ORAL | Status: DC | PRN

## 2012-02-11 MED ORDER — FULVESTRANT 250 MG/5ML IM SOLN
500.0000 mg | INTRAMUSCULAR | Status: DC
  Administered 2012-02-11: 500 mg via INTRAMUSCULAR
  Filled 2012-02-11: qty 10

## 2012-02-16 ENCOUNTER — Other Ambulatory Visit: Payer: Self-pay | Admitting: Oncology

## 2012-02-16 ENCOUNTER — Other Ambulatory Visit: Payer: Self-pay | Admitting: Internal Medicine

## 2012-02-16 DIAGNOSIS — C50919 Malignant neoplasm of unspecified site of unspecified female breast: Secondary | ICD-10-CM

## 2012-02-18 ENCOUNTER — Encounter (HOSPITAL_COMMUNITY): Payer: Self-pay | Admitting: Adult Health

## 2012-02-18 ENCOUNTER — Inpatient Hospital Stay (HOSPITAL_COMMUNITY)
Admission: EM | Admit: 2012-02-18 | Discharge: 2012-02-25 | DRG: 208 | Disposition: A | Discharge status: Skilled Nursing Facility | Payer: Medicare Other | Attending: Emergency Medicine | Admitting: Emergency Medicine

## 2012-02-18 ENCOUNTER — Emergency Department (HOSPITAL_COMMUNITY): Payer: Medicare Other

## 2012-02-18 DIAGNOSIS — M1990 Unspecified osteoarthritis, unspecified site: Secondary | ICD-10-CM | POA: Diagnosis present

## 2012-02-18 DIAGNOSIS — E876 Hypokalemia: Secondary | ICD-10-CM | POA: Diagnosis not present

## 2012-02-18 DIAGNOSIS — E11359 Type 2 diabetes mellitus with proliferative diabetic retinopathy without macular edema: Secondary | ICD-10-CM | POA: Diagnosis present

## 2012-02-18 DIAGNOSIS — J9620 Acute and chronic respiratory failure, unspecified whether with hypoxia or hypercapnia: Principal | ICD-10-CM | POA: Diagnosis present

## 2012-02-18 DIAGNOSIS — Z6841 Body Mass Index (BMI) 40.0 and over, adult: Secondary | ICD-10-CM

## 2012-02-18 DIAGNOSIS — Z8673 Personal history of transient ischemic attack (TIA), and cerebral infarction without residual deficits: Secondary | ICD-10-CM

## 2012-02-18 DIAGNOSIS — Z950 Presence of cardiac pacemaker: Secondary | ICD-10-CM

## 2012-02-18 DIAGNOSIS — C50919 Malignant neoplasm of unspecified site of unspecified female breast: Secondary | ICD-10-CM

## 2012-02-18 DIAGNOSIS — I10 Essential (primary) hypertension: Secondary | ICD-10-CM

## 2012-02-18 DIAGNOSIS — J810 Acute pulmonary edema: Secondary | ICD-10-CM

## 2012-02-18 DIAGNOSIS — E872 Acidosis, unspecified: Secondary | ICD-10-CM | POA: Diagnosis present

## 2012-02-18 DIAGNOSIS — D696 Thrombocytopenia, unspecified: Secondary | ICD-10-CM | POA: Diagnosis present

## 2012-02-18 DIAGNOSIS — C771 Secondary and unspecified malignant neoplasm of intrathoracic lymph nodes: Secondary | ICD-10-CM | POA: Diagnosis present

## 2012-02-18 DIAGNOSIS — E1142 Type 2 diabetes mellitus with diabetic polyneuropathy: Secondary | ICD-10-CM | POA: Diagnosis present

## 2012-02-18 DIAGNOSIS — E1149 Type 2 diabetes mellitus with other diabetic neurological complication: Secondary | ICD-10-CM | POA: Diagnosis present

## 2012-02-18 DIAGNOSIS — Z87891 Personal history of nicotine dependence: Secondary | ICD-10-CM

## 2012-02-18 DIAGNOSIS — J441 Chronic obstructive pulmonary disease with (acute) exacerbation: Secondary | ICD-10-CM

## 2012-02-18 DIAGNOSIS — G9349 Other encephalopathy: Secondary | ICD-10-CM | POA: Diagnosis present

## 2012-02-18 DIAGNOSIS — E1139 Type 2 diabetes mellitus with other diabetic ophthalmic complication: Secondary | ICD-10-CM

## 2012-02-18 DIAGNOSIS — E875 Hyperkalemia: Secondary | ICD-10-CM

## 2012-02-18 DIAGNOSIS — J9602 Acute respiratory failure with hypercapnia: Secondary | ICD-10-CM

## 2012-02-18 DIAGNOSIS — R112 Nausea with vomiting, unspecified: Secondary | ICD-10-CM | POA: Diagnosis not present

## 2012-02-18 DIAGNOSIS — J811 Chronic pulmonary edema: Secondary | ICD-10-CM | POA: Diagnosis present

## 2012-02-18 DIAGNOSIS — N179 Acute kidney failure, unspecified: Secondary | ICD-10-CM | POA: Diagnosis present

## 2012-02-18 DIAGNOSIS — I4430 Unspecified atrioventricular block: Secondary | ICD-10-CM | POA: Diagnosis present

## 2012-02-18 DIAGNOSIS — G4733 Obstructive sleep apnea (adult) (pediatric): Secondary | ICD-10-CM

## 2012-02-18 DIAGNOSIS — D630 Anemia in neoplastic disease: Secondary | ICD-10-CM | POA: Diagnosis present

## 2012-02-18 DIAGNOSIS — I5033 Acute on chronic diastolic (congestive) heart failure: Secondary | ICD-10-CM

## 2012-02-18 DIAGNOSIS — G9340 Encephalopathy, unspecified: Secondary | ICD-10-CM

## 2012-02-18 HISTORY — DX: Type 2 diabetes mellitus with diabetic neuropathy, unspecified: E11.40

## 2012-02-18 HISTORY — DX: Unspecified osteoarthritis, unspecified site: M19.90

## 2012-02-18 HISTORY — DX: Type 2 diabetes mellitus with hyperosmolarity without nonketotic hyperglycemic-hyperosmolar coma (NKHHC): E11.00

## 2012-02-18 LAB — PRO B NATRIURETIC PEPTIDE: Pro B Natriuretic peptide (BNP): 393.6 pg/mL — ABNORMAL HIGH (ref 0–125)

## 2012-02-18 LAB — COMPREHENSIVE METABOLIC PANEL
Albumin: 3 g/dL — ABNORMAL LOW (ref 3.5–5.2)
Alkaline Phosphatase: 58 U/L (ref 39–117)
BUN: 20 mg/dL (ref 6–23)
CO2: 24 mEq/L (ref 19–32)
Chloride: 106 mEq/L (ref 96–112)
Creatinine, Ser: 1.02 mg/dL (ref 0.50–1.10)
GFR calc non Af Amer: 56 mL/min — ABNORMAL LOW (ref >90)
Glucose, Bld: 227 mg/dL — ABNORMAL HIGH (ref 70–99)
Potassium: 5.8 mEq/L — ABNORMAL HIGH (ref 3.5–5.1)
Total Bilirubin: 0.2 mg/dL — ABNORMAL LOW (ref 0.3–1.2)

## 2012-02-18 LAB — CBC WITH DIFFERENTIAL/PLATELET
Basophils Relative: 0 % (ref 0–1)
Eosinophils Absolute: 0 10*3/uL (ref 0.0–0.7)
Eosinophils Relative: 0 % (ref 0–5)
HCT: 33.2 % — ABNORMAL LOW (ref 36.0–46.0)
Hemoglobin: 9.5 g/dL — ABNORMAL LOW (ref 12.0–15.0)
Lymphs Abs: 3.4 10*3/uL (ref 0.7–4.0)
MCH: 21.7 pg — ABNORMAL LOW (ref 26.0–34.0)
MCHC: 28.6 g/dL — ABNORMAL LOW (ref 30.0–36.0)
MCV: 76 fL — ABNORMAL LOW (ref 78.0–100.0)
Monocytes Absolute: 1.4 10*3/uL — ABNORMAL HIGH (ref 0.1–1.0)
Neutro Abs: 15.2 10*3/uL — ABNORMAL HIGH (ref 1.7–7.7)
RBC: 4.37 MIL/uL (ref 3.87–5.11)

## 2012-02-18 LAB — URINALYSIS, MICROSCOPIC ONLY
Bilirubin Urine: NEGATIVE
Glucose, UA: NEGATIVE mg/dL
Hgb urine dipstick: NEGATIVE
Protein, ur: 100 mg/dL — AB

## 2012-02-18 LAB — POCT I-STAT 3, ART BLOOD GAS (G3+)
Acid-base deficit: 3 mmol/L — ABNORMAL HIGH (ref 0.0–2.0)
Acid-base deficit: 2 mmol/L (ref 0.0–2.0)
Acid-base deficit: 1 mmol/L (ref 0.0–2.0)
O2 Saturation: 90 %
pH, Arterial: 7.151 — CL (ref 7.350–7.450)
pH, Arterial: 7.246 — ABNORMAL LOW (ref 7.350–7.450)

## 2012-02-18 LAB — LACTIC ACID, PLASMA: Lactic Acid, Venous: 1.2 mmol/L (ref 0.5–2.2)

## 2012-02-18 LAB — MRSA PCR SCREENING: MRSA by PCR: POSITIVE — AB

## 2012-02-18 LAB — GLUCOSE, CAPILLARY: Glucose-Capillary: 120 mg/dL — ABNORMAL HIGH (ref 70–99)

## 2012-02-18 LAB — PROTIME-INR
INR: 1.05 (ref 0.00–1.49)
Prothrombin Time: 13.6 seconds (ref 11.6–15.2)

## 2012-02-18 MED ORDER — IPRATROPIUM BROMIDE HFA 17 MCG/ACT IN AERS
6.0000 | INHALATION_SPRAY | Freq: Four times a day (QID) | RESPIRATORY_TRACT | Status: DC
  Administered 2012-02-19 – 2012-02-21 (×10): 6 via RESPIRATORY_TRACT
  Filled 2012-02-18: qty 12.9

## 2012-02-18 MED ORDER — ASPIRIN-DIPYRIDAMOLE ER 25-200 MG PO CP12
1.0000 | ORAL_CAPSULE | Freq: Two times a day (BID) | ORAL | Status: DC
  Administered 2012-02-18 – 2012-02-25 (×14): 1 via ORAL
  Filled 2012-02-18 (×16): qty 1

## 2012-02-18 MED ORDER — ALBUTEROL SULFATE (5 MG/ML) 0.5% IN NEBU
5.0000 mg | INHALATION_SOLUTION | Freq: Once | RESPIRATORY_TRACT | Status: AC
  Administered 2012-02-18: 5 mg via RESPIRATORY_TRACT
  Filled 2012-02-18: qty 1

## 2012-02-18 MED ORDER — PROPOFOL 10 MG/ML IV EMUL
5.0000 ug/kg/min | INTRAVENOUS | Status: DC
  Administered 2012-02-18 (×2): 20 mL via INTRAVENOUS

## 2012-02-18 MED ORDER — ALBUTEROL SULFATE HFA 108 (90 BASE) MCG/ACT IN AERS
6.0000 | INHALATION_SPRAY | Freq: Four times a day (QID) | RESPIRATORY_TRACT | Status: DC
  Administered 2012-02-19 – 2012-02-21 (×10): 6 via RESPIRATORY_TRACT
  Filled 2012-02-18: qty 6.7

## 2012-02-18 MED ORDER — BIOTENE DRY MOUTH MT LIQD
15.0000 mL | Freq: Four times a day (QID) | OROMUCOSAL | Status: DC
  Administered 2012-02-19 – 2012-02-21 (×11): 15 mL via OROMUCOSAL

## 2012-02-18 MED ORDER — PROPOFOL 10 MG/ML IV EMUL
INTRAVENOUS | Status: AC
  Administered 2012-02-18: 20 mL via INTRAVENOUS
  Filled 2012-02-18: qty 100

## 2012-02-18 MED ORDER — PROPOFOL 10 MG/ML IV EMUL
5.0000 ug/kg/min | INTRAVENOUS | Status: DC
  Administered 2012-02-18: 20 ug/kg/min via INTRAVENOUS
  Administered 2012-02-19: 10 ug/kg/min via INTRAVENOUS
  Administered 2012-02-19: 50 ug/kg/min via INTRAVENOUS
  Administered 2012-02-19: 25 ug/kg/min via INTRAVENOUS
  Administered 2012-02-19: 30 ug/kg/min via INTRAVENOUS
  Administered 2012-02-20 (×2): 15 ug/kg/min via INTRAVENOUS
  Filled 2012-02-18 (×8): qty 100

## 2012-02-18 MED ORDER — IPRATROPIUM BROMIDE 0.02 % IN SOLN
0.5000 mg | Freq: Once | RESPIRATORY_TRACT | Status: AC
  Administered 2012-02-18: 0.5 mg via RESPIRATORY_TRACT
  Filled 2012-02-18: qty 2.5

## 2012-02-18 MED ORDER — SODIUM CHLORIDE 0.9 % IV SOLN
25.0000 ug/h | INTRAVENOUS | Status: DC
  Administered 2012-02-19: 25 ug/h via INTRAVENOUS
  Administered 2012-02-20: 100 ug/h via INTRAVENOUS
  Filled 2012-02-18 (×2): qty 50

## 2012-02-18 MED ORDER — ALBUTEROL SULFATE HFA 108 (90 BASE) MCG/ACT IN AERS
6.0000 | INHALATION_SPRAY | RESPIRATORY_TRACT | Status: DC | PRN
  Filled 2012-02-18: qty 6.7

## 2012-02-18 MED ORDER — CARVEDILOL 25 MG PO TABS
25.0000 mg | ORAL_TABLET | Freq: Two times a day (BID) | ORAL | Status: DC
  Administered 2012-02-18 – 2012-02-25 (×14): 25 mg via ORAL
  Filled 2012-02-18 (×17): qty 1

## 2012-02-18 MED ORDER — FUROSEMIDE 10 MG/ML IJ SOLN
60.0000 mg | Freq: Once | INTRAMUSCULAR | Status: AC
  Administered 2012-02-18: 60 mg via INTRAVENOUS
  Filled 2012-02-18: qty 6

## 2012-02-18 MED ORDER — HYDRALAZINE HCL 20 MG/ML IJ SOLN
10.0000 mg | INTRAMUSCULAR | Status: DC | PRN
  Administered 2012-02-19 (×2): 10 mg via INTRAVENOUS
  Filled 2012-02-18 (×2): qty 0.5

## 2012-02-18 MED ORDER — FENTANYL CITRATE 0.05 MG/ML IJ SOLN
100.0000 ug | Freq: Once | INTRAMUSCULAR | Status: AC
  Administered 2012-02-18: 100 ug via INTRAVENOUS

## 2012-02-18 MED ORDER — CHLORHEXIDINE GLUCONATE 0.12 % MT SOLN
15.0000 mL | Freq: Two times a day (BID) | OROMUCOSAL | Status: DC
  Administered 2012-02-18 – 2012-02-21 (×6): 15 mL via OROMUCOSAL
  Filled 2012-02-18 (×6): qty 15

## 2012-02-18 MED ORDER — FENTANYL CITRATE 0.05 MG/ML IJ SOLN
INTRAMUSCULAR | Status: AC
  Administered 2012-02-18: 50 ug
  Filled 2012-02-18: qty 2

## 2012-02-18 MED ORDER — AMLODIPINE BESYLATE 5 MG PO TABS
5.0000 mg | ORAL_TABLET | Freq: Every day | ORAL | Status: DC
  Administered 2012-02-18 – 2012-02-20 (×3): 5 mg via ORAL
  Filled 2012-02-18 (×4): qty 1

## 2012-02-18 MED ORDER — METHYLPREDNISOLONE SODIUM SUCC 40 MG IJ SOLR
40.0000 mg | Freq: Two times a day (BID) | INTRAMUSCULAR | Status: DC
  Administered 2012-02-18 – 2012-02-20 (×4): 40 mg via INTRAVENOUS
  Filled 2012-02-18 (×6): qty 1

## 2012-02-18 MED ORDER — FENTANYL BOLUS VIA INFUSION
25.0000 ug | Freq: Four times a day (QID) | INTRAVENOUS | Status: DC | PRN
  Administered 2012-02-20 (×2): 25 ug via INTRAVENOUS
  Filled 2012-02-18: qty 100

## 2012-02-18 MED ORDER — IRBESARTAN 300 MG PO TABS
300.0000 mg | ORAL_TABLET | Freq: Every day | ORAL | Status: DC
  Administered 2012-02-18 – 2012-02-25 (×8): 300 mg via ORAL
  Filled 2012-02-18 (×8): qty 1

## 2012-02-18 MED ORDER — PANTOPRAZOLE SODIUM 40 MG PO PACK
40.0000 mg | PACK | Freq: Every day | ORAL | Status: DC
  Administered 2012-02-18 – 2012-02-21 (×4): 40 mg
  Filled 2012-02-18 (×5): qty 20

## 2012-02-18 MED ORDER — INSULIN ASPART 100 UNIT/ML ~~LOC~~ SOLN
0.0000 [IU] | SUBCUTANEOUS | Status: DC
  Administered 2012-02-19: 4 [IU] via SUBCUTANEOUS
  Administered 2012-02-19: 3 [IU] via SUBCUTANEOUS
  Administered 2012-02-19 (×3): 4 [IU] via SUBCUTANEOUS
  Administered 2012-02-19: 3 [IU] via SUBCUTANEOUS
  Administered 2012-02-20 (×2): 7 [IU] via SUBCUTANEOUS
  Administered 2012-02-20: 4 [IU] via SUBCUTANEOUS
  Administered 2012-02-20: 7 [IU] via SUBCUTANEOUS
  Administered 2012-02-21: 3 [IU] via SUBCUTANEOUS
  Administered 2012-02-21: 7 [IU] via SUBCUTANEOUS
  Administered 2012-02-21: 3 [IU] via SUBCUTANEOUS
  Administered 2012-02-21: 4 [IU] via SUBCUTANEOUS

## 2012-02-18 MED ORDER — SODIUM CHLORIDE 0.9 % IV SOLN
INTRAVENOUS | Status: DC
  Administered 2012-02-18 – 2012-02-20 (×2): via INTRAVENOUS
  Administered 2012-02-21: 20 mL/h via INTRAVENOUS

## 2012-02-18 MED ORDER — HEPARIN SODIUM (PORCINE) 5000 UNIT/ML IJ SOLN
5000.0000 [IU] | Freq: Three times a day (TID) | INTRAMUSCULAR | Status: DC
  Administered 2012-02-18 – 2012-02-19 (×2): 5000 [IU] via SUBCUTANEOUS
  Filled 2012-02-18 (×5): qty 1

## 2012-02-18 MED ORDER — MOXIFLOXACIN HCL IN NACL 400 MG/250ML IV SOLN
400.0000 mg | INTRAVENOUS | Status: DC
  Administered 2012-02-18 – 2012-02-21 (×4): 400 mg via INTRAVENOUS
  Filled 2012-02-18 (×5): qty 250

## 2012-02-18 MED ORDER — ATORVASTATIN CALCIUM 20 MG PO TABS
20.0000 mg | ORAL_TABLET | Freq: Every day | ORAL | Status: DC
  Administered 2012-02-19 – 2012-02-24 (×6): 20 mg via ORAL
  Filled 2012-02-18 (×7): qty 1

## 2012-02-18 NOTE — H&P (Signed)
Name: Melissa Villa MRN: [id number] DOB: [birth year]    LOS: 0  REFERRING PRIVIDER: Glynn Octave PRIMARY CARE: Margarito Liner PULMONARY: Jetty Duhamel CARDIOLOGY: Lewayne Bunting ONCOLOGY: Gus Magrinat CHIEF COMPLAINT:  Respiratory failure   Brief patient description:  66 yo female admitted 11/01 after running out of oxygen at [facility] and developing acute on chronic hypoxic, hypercapnic respiratory failure requiring intubation from AECOPD and acute diastolic heart failure.  Significant PMHx Recurrent breast cancer (dx 01/19/12), COPD, OSA, Chronic hypoxia, Morbid Obesity, HTN, AV block s/p PM, DM type II with retinopathy/neuropathy, Carcinoid s/p RLLectomy  Lines/tubes: ETT 11/01>> Lt Radial aline 11/01>>  Cultures: Sputum 11/01>>  Antibiotics: Avelox (AECOPD) 11/01>>  Significant studies procedures and events:    Level of Care:  ICU Primary Service: PCCM  Consultants:  None Code Status:  Full Diet:  NPO DVT Px:  SQ heparin GI Px:  Protonix  HISTORY OF PRESENT ILLNESS:   66 yo was at [facility], and reported to have run out of oxygen.  She had trouble breathing, and passed out.  Paramedics arrived, and she was somewhat arousable.  She required intubation.  After this her mental status improved.  She was noted to have pulmonary edema on CXR and acidosis on ABG.  She had wheezing on exam.  Family reports that she was feeling hot earlier in the day.    She was recently diagnosed with recurrent breast cancer with metastatic disease to her mediastinal nodes.  PAST MEDICAL HISTORY :  Past Medical History  Diagnosis Date  . DM hyperosmolarity type II   . Hypertension   . COPD (chronic obstructive pulmonary disease)   . Diastolic CHF   . TIA (transient ischemic attack)   . OSA (obstructive sleep apnea)   . Chronic respiratory failure with hypoxia   . Anemia   . DJD (degenerative joint disease)   . AV block   . DM retinopathy   . DM neuropathies   . Carcinoid  tumor of lung   . Breast cancer    Past Surgical History  Procedure Date  . Mastectomy partial / lumpectomy   . Lobectomy   . Pacemaker insertion   . Cholecystectomy   . Tonsillectomy    Prior to Admission medications   Not on File   No Known Allergies  FAMILY HISTORY:  Family History  Problem Relation Age of Onset  . Breast cancer Mother   . Diabetes Mellitus II Mother   . Hypertension Brother   . Coronary artery disease Brother   . Stroke Brother   . Diabetes type II Brother   . Hypertension Sister    SOCIAL HISTORY:  reports that she quit smoking about 10 years ago. Her smoking use included Cigarettes. She has a 15 pack-year smoking history. She does not have any smokeless tobacco history on file. She reports that she does not drink alcohol. Her drug history not on file.  REVIEW OF SYSTEMS:   Unable to obtain.  Interval/ subjective:   Vital Signs: Temp:  [98.6 F (37 C)-99.5 F (37.5 C)] 99.5 F (37.5 C) (11/01 1845) Pulse Rate:  [59-60] 60  (11/01 1845) Resp:  [18-24] 21  (11/01 1845) BP: (93-185)/(31-90) 185/54 mmHg (11/01 1845) SpO2:  [100 %] 100 % (11/01 1845) Arterial Line BP: (168-172)/(53-58) 172/58 mmHg (11/01 1845) FiO2 (%):  [40 %-100 %] 40 % (11/01 1845) Weight:  [300 lb (136.079 kg)] 300 lb (136.079 kg) (11/01 1617)  Physical Examination: General:  Obese Neuro:  On sedation, arousable, follows commands, moves all extremities     HEENT:  ETT, OG tube in place Cardiovascular:  s1s2 regular, distant heart sounds, no murmur Lungs:  Prolonged exhalation, b/l expiratory wheezing Abdomen:  Obese, soft, non-tender, decreased bowel sounds, foley in place Musculoskeletal:  No edema Skin:  No rashes  DIAGNOSES: Principal Problem:  *Acute-on-chronic respiratory failure Active Problems:  Acute pulmonary edema  Acute on chronic diastolic congestive heart failure  Hypertension  COPD with acute exacerbation  OSA (obstructive sleep apnea)   Hyperkalemia  Acute respiratory acidosis  Breast cancer  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   A:  Acute on chronic respiratory failure in setting of AECOPD and acute pulmonary edema with hx of OSA. P:   Full vent support Adjust Vt to keep 8cc/kg Avoid auto PEEP>>permissive hypercapnia Start solumedrol Schedule bronchodilators F/u CXR and ABG Will need CPAP after extubation  CARDIOVASCULAR  Lab 02/18/12 1652  LATICACIDVEN 1.2  O2SATVEN --  PROBNP 393.6*   ECG:  Pending  A: Acute pulmonary edema with acute on chronic diastolic dysfunction likely from acute hypoxia with hx of HTN. P:  Monitor heart rhythm Diureses as tolerated Hydralazine as needed to keep SBP < 170 F/u cardiac enzymes  RENAL  Lab 02/18/12 1606  NA 140  K 5.8*  CL 106  CO2 24  BUN 20  CREATININE 1.02  CALCIUM 8.6  MG --  PHOS --   Intake/Output      11/01 0701 - 11/02 0700   I.V. (mL/kg) 1000 (7.3)   Total Intake(mL/kg) 1000 (7.3)   Other 250   Total Output 250   Net +750         A:  Hyperkalemia>>likely related to acidosis. P:   F/u BMET Monitor urine outpt   GASTROINTESTINAL  Lab 02/18/12 1606  AST 27  ALT 20  ALKPHOS 58  PROT 7.8  ALBUMIN 3.0*    A:  No acute issues P:   NPO while on vent  HEMATOLOGIC  Lab 02/18/12 1606  HGB 9.5*  HCT 33.2*  PLT 297  INR 1.05  APTT --    A:  Recent dx of recurrent breast cancer with metastatic disease to mediastinal lymph nodes. Anemia of chronic disease. P:  F/u CBC Will need outpt f/u with oncology  INFECTIOUS  Lab  02/18/12 1652 02/18/12 1606  PROCALCITON -- --  WBC -- 20.0*  LATICACIDVEN 1.2 --    A:  AECOPD. P:   D1/x avelox  ENDOCRINE CBG (last 3)  No results found for this basename: GLUCAP:5 in the last 168 hours  A:  DM type II with hyperglycemia.   P:   SSI  NEUROLOGIC  A:  Acute encephalopathy 2nd to acute respiratory failure with hypoxia. P:   Sedation protocol while on vent   Summary statement:  Updated family at bedside.  They are not aware of living will/advanced directive.  Full code for now.  May need to address goals of care further given multiple comorbid conditions and recent dx  of recurrent breast cancer.  Critical care time 40 minutes.  Coralyn Helling, MD [facility] Pulmonary/Critical Care 02/18/2012, 7:37 PM Pager:  [phone] After 3pm call: [phone]

## 2012-02-18 NOTE — ED Notes (Signed)
Patient's inner meatus noted to be very red and swollen upon insertion of foley cathether.

## 2012-02-18 NOTE — ED Notes (Signed)
Family at bedside. 

## 2012-02-18 NOTE — ED Provider Notes (Signed)
History     CSN: [id number]  Arrival date & time 02/18/12  1558   First MD Initiated Contact with Patient 02/18/12 1605      Chief Complaint  Patient presents with  . Respiratory Distress    (Consider location/radiation/quality/duration/timing/severity/associated sxs/prior treatment) HPI History, ROS limited by pt condition of intubated.  Patient was at [employer] and became unresponsive. EMS called. When they arrived, pt unresponsive and was intubated. Arrived here intubated, with eyes open, able to follow commands.   Obtained additional history from pt son and daughter-in-law. They state pt with h/o recurrent breast cancer, COPD on oxygen therapy at home, heart failure with pacemaker. State she was in normal state of health yesterday. Saw her this morning and she was doing okay. Slightly more sluggish than usual. But still able to take care of herself at home. No recent fever. No recent cough. No recent trauma, change in meds.   Past Medical History  Diagnosis Date  . DM hyperosmolarity type II   . Hypertension   . COPD (chronic obstructive pulmonary disease)   . Diastolic CHF   . TIA (transient ischemic attack)   . OSA (obstructive sleep apnea)   . Chronic respiratory failure with hypoxia   . Anemia   . DJD (degenerative joint disease)   . AV block   . DM retinopathy   . DM neuropathies   . Carcinoid tumor of lung   . Breast cancer     History reviewed. No pertinent past surgical history.  History reviewed. No pertinent family history.  History  Substance Use Topics  . Smoking status: Not on file  . Smokeless tobacco: Not on file  . Alcohol Use: No    OB History    Grav Para Term Preterm Abortions TAB SAB Ect Mult Living                  Review of Systems Unable to obtain secondary to pt condition.  Allergies  Review of patient's allergies indicates no known allergies.  Home Medications  No current outpatient prescriptions on file.  BP 185/54  Pulse  60  Temp 100 F (37.8 C)  Resp 14  Ht 5\' 5"  (1.651 m)  Wt 300 lb (136.079 kg)  BMI 49.92 kg/m2  SpO2 100%  Physical Exam Nursing note and vitals reviewed.  Constitutional: Pt is intubated, awake.  Oropharynx: Airway with scant blood. CO2 detector with good color change. Respiratory: Equal, bilateral breath sounds.  CV: Extremities warm and well perfused. Neuro: Moving all 4 extremities. Eyes open. Able to follow commands.  Head: Normocephalic and atraumatic. Eyes: No conjunctivitis, no scleral icterus. Neck: Supple, no mass. Chest: Scaring over right breast. Abdomen: Soft, obese.  MSK: Extremities are atraumatic without deformity. Skin: No rash, no wounds.  ED Course  INTUBATION Performed by: Charm Barges Authorized by: Charm Barges Consent: The procedure was performed in an emergent situation. Required items: required blood products, implants, devices, and special equipment available Patient identity confirmed: provided demographic data and arm band Time out: Immediately prior to procedure a "time out" was called to verify the correct patient, procedure, equipment, support staff and site/side marked as required. Indications: respiratory failure (existing ETT with cuff leak) Intubation method: tube exchange with bougie. Patient status: sedated Preoxygenation: ETT tube. Sedatives: propofol Tube size: 7.5 mm Tube type: cuffed Number of attempts: 1 Post-procedure assessment: chest rise and CO2 detector Breath sounds: equal Cuff inflated: yes ETT to lip: 22 cm Tube secured with: ETT holder Chest  x-ray interpreted by me. Chest x-ray findings: endotracheal tube in appropriate position Patient tolerance: Patient tolerated the procedure well with no immediate complications.   (including critical care time)  Labs Reviewed  CBC WITH DIFFERENTIAL - Abnormal; Notable for the following:    WBC 20.0 (*)     Hemoglobin 9.5 (*)     HCT 33.2 (*)     MCV 76.0 (*)     MCH  21.7 (*)     MCHC 28.6 (*)     RDW 22.5 (*)     Neutro Abs 15.2 (*)     Monocytes Absolute 1.4 (*)     All other components within normal limits  COMPREHENSIVE METABOLIC PANEL - Abnormal; Notable for the following:    Potassium 5.8 (*)     Glucose, Bld 227 (*)     Albumin 3.0 (*)     Total Bilirubin 0.2 (*)     GFR calc non Af Amer 56 (*)     GFR calc Af Amer 65 (*)     All other components within normal limits  URINALYSIS, MICROSCOPIC ONLY - Abnormal; Notable for the following:    Protein, ur 100 (*)     Squamous Epithelial / LPF FEW (*)     All other components within normal limits  PRO B NATRIURETIC PEPTIDE - Abnormal; Notable for the following:    Pro B Natriuretic peptide (BNP) 393.6 (*)     All other components within normal limits  POCT I-STAT 3, BLOOD GAS (G3+) - Abnormal; Notable for the following:    pH, Arterial 7.151 (*)     pCO2 arterial 75.0 (*)     pO2, Arterial 425.0 (*)     Bicarbonate 26.2 (*)     Acid-base deficit 3.0 (*)     All other components within normal limits  POCT I-STAT 3, BLOOD GAS (G3+) - Abnormal; Notable for the following:    pH, Arterial 7.246 (*)     pCO2 arterial 58.5 (*)     pO2, Arterial 66.0 (*)     Bicarbonate 25.5 (*)     All other components within normal limits  PROTIME-INR  TROPONIN I  LACTIC ACID, PLASMA  CULTURE, RESPIRATORY   Dg Chest Portable 1 View  02/18/2012  *RADIOLOGY REPORT*  Clinical Data: Respiratory distress.  PORTABLE CHEST - 1 VIEW  Comparison: Chest x-ray 02/18/2012.  Findings: An endotracheal tube is in place with tip 2.7 cm above the carina. A nasogastric tube is seen extending into the stomach, however, the tip of the nasogastric tube extends below the lower margin of the image.  Lung volumes are low.  Bibasilar opacities (right greater than left) may reflect areas of atelectasis and/or consolidation.  Small right pleural effusion. There is cephalization of the pulmonary vasculature and slight indistinctness of the  interstitial markings suggestive of mild pulmonary edema.  The patient is severely rotated to the right which causes gross distortion of cardiomediastinal contours.  Heart size does appear enlarged.  Atherosclerosis in the thoracic aorta. Right-sided pacemaker device in place with lead tips projecting over the expected location of the right atrium and right ventricular apex.  Surgical clips project over the axillary regions bilaterally.  IMPRESSION: 1.  Support apparatus and postoperative changes, as above. 2.  Findings suggest congestive heart failure, as above. 3.  Persistent bibasilar atelectasis and/or consolidation with superimposed small right pleural effusion. 4.  Atherosclerosis.   Original Report Authenticated By: Trudie Reed, M.D.    Dg Chest  Portable 1 View  02/18/2012  *RADIOLOGY REPORT*  Clinical Data: Respiratory distress  PORTABLE CHEST - 1 VIEW  Comparison: None  Findings: The endotracheal tube tip is in satisfactory position above the carina.  The patient has a right chest wall pacer device with lead in the right atrial appendage and right ventricle.  The heart size is enlarged.  There is a right pleural effusion. Moderate interstitial edema is present. Chronic-appearing right posterior lateral rib fracture is noted.  There are surgical clips identified within the axillary regions.  IMPRESSION:  1.  Suspect congestive heart failure. 2.  Endotracheal tube tip in satisfactory position above the carina.   Original Report Authenticated By: Signa Kell, M.D.      1. Acute-on-chronic respiratory failure   2. Acute encephalopathy   3. Acute on chronic diastolic congestive heart failure   4. Acute pulmonary edema       MDM  66 y.o. female Intubated by EMS with respiratory failure. Not given meds. Arrived here awake, following commands. Tube placement verified and pt given sedation with IV fentanyl, propofol. Tube with cuff leak so exchange as above. Pt with low bp. Propofol turned down  without improvement. Pt given fluid bolus and A line placed by RT. CXR with edema. Fluid stopped. EKG with pace rhythm. No STEMI. ABG with respiratory acidosis. Discussed with RT and FiO2 decreased and rate increased. Troponin low. K elevated to 5.8. Called pulmonary for admission to ICU. Had discussion with family and provided counseling.         Charm Barges, MD 02/18/12 2024

## 2012-02-18 NOTE — ED Notes (Addendum)
At [facility], ran out of oxygen , family called EMS, upon EMS arrival pt was unresponsive, EMS intubated with 7.0 +gag reflex. Upon arrival to ER pt with eyes open, intubated blood in ET tube. Given Fentanyl and propofol.

## 2012-02-18 NOTE — Procedures (Signed)
Arterial Catheter Insertion Procedure Note Melissa Villa [id number] 07/11/1943  Procedure: Insertion of Arterial Catheter  Indications: Blood pressure monitoring  Procedure Details Consent: Unable to obtain consent because of emergent medical necessity. Time Out: Verified patient identification, verified procedure, site/side was marked, verified correct patient position, special equipment/implants available, medications/allergies/relevent history reviewed, required imaging and test results available.  Performed  Maximum sterile technique was used including antiseptics, cap, gloves, gown, hand hygiene and mask. Skin prep: Chlorhexidine; local anesthetic administered 20 gauge catheter was inserted into left radial artery using the Seldinger technique.  Evaluation Blood flow good; BP tracing good. Complications: No apparent complications.   Carrington Clamp 02/18/2012

## 2012-02-19 ENCOUNTER — Inpatient Hospital Stay (HOSPITAL_COMMUNITY): Payer: Medicare Other

## 2012-02-19 DIAGNOSIS — G9340 Encephalopathy, unspecified: Secondary | ICD-10-CM

## 2012-02-19 DIAGNOSIS — E872 Acidosis: Secondary | ICD-10-CM

## 2012-02-19 DIAGNOSIS — I509 Heart failure, unspecified: Secondary | ICD-10-CM

## 2012-02-19 DIAGNOSIS — J9620 Acute and chronic respiratory failure, unspecified whether with hypoxia or hypercapnia: Principal | ICD-10-CM

## 2012-02-19 DIAGNOSIS — J810 Acute pulmonary edema: Secondary | ICD-10-CM

## 2012-02-19 DIAGNOSIS — I5033 Acute on chronic diastolic (congestive) heart failure: Secondary | ICD-10-CM

## 2012-02-19 LAB — POCT I-STAT 3, ART BLOOD GAS (G3+)
O2 Saturation: 94 %
O2 Saturation: 94 %
Patient temperature: 98.6
Patient temperature: 99.1
TCO2: 27 mmol/L (ref 0–100)
TCO2: 27 mmol/L (ref 0–100)
pCO2 arterial: 49.3 mmHg — ABNORMAL HIGH (ref 35.0–45.0)

## 2012-02-19 LAB — GLUCOSE, CAPILLARY
Glucose-Capillary: 121 mg/dL — ABNORMAL HIGH (ref 70–99)
Glucose-Capillary: 158 mg/dL — ABNORMAL HIGH (ref 70–99)

## 2012-02-19 LAB — BASIC METABOLIC PANEL
BUN: 21 mg/dL (ref 6–23)
GFR calc non Af Amer: 60 mL/min — ABNORMAL LOW (ref >90)
Glucose, Bld: 149 mg/dL — ABNORMAL HIGH (ref 70–99)
Potassium: 4.9 mEq/L (ref 3.5–5.1)

## 2012-02-19 LAB — CBC
HCT: 31.2 % — ABNORMAL LOW (ref 36.0–46.0)
Hemoglobin: 8.9 g/dL — ABNORMAL LOW (ref 12.0–15.0)
MCH: 21.3 pg — ABNORMAL LOW (ref 26.0–34.0)
MCHC: 28.5 g/dL — ABNORMAL LOW (ref 30.0–36.0)

## 2012-02-19 MED ORDER — HYDRALAZINE HCL 25 MG PO TABS
25.0000 mg | ORAL_TABLET | Freq: Three times a day (TID) | ORAL | Status: DC
  Administered 2012-02-19 – 2012-02-20 (×5): 25 mg via ORAL
  Filled 2012-02-19 (×9): qty 1

## 2012-02-19 MED ORDER — OSMOLITE 1.2 CAL PO LIQD
1000.0000 mL | ORAL | Status: DC
  Filled 2012-02-19 (×2): qty 1000

## 2012-02-19 MED ORDER — ENOXAPARIN SODIUM 40 MG/0.4ML ~~LOC~~ SOLN
40.0000 mg | SUBCUTANEOUS | Status: DC
  Administered 2012-02-19 – 2012-02-21 (×3): 40 mg via SUBCUTANEOUS
  Filled 2012-02-19 (×3): qty 0.4

## 2012-02-19 MED ORDER — PRO-STAT SUGAR FREE PO LIQD
30.0000 mL | Freq: Four times a day (QID) | ORAL | Status: DC
  Administered 2012-02-19 – 2012-02-21 (×5): 30 mL
  Filled 2012-02-19 (×10): qty 30

## 2012-02-19 MED ORDER — FUROSEMIDE 10 MG/ML IJ SOLN
INTRAMUSCULAR | Status: AC
  Administered 2012-02-19: 60 mg via INTRAVENOUS
  Filled 2012-02-19: qty 8

## 2012-02-19 MED ORDER — FUROSEMIDE 10 MG/ML IJ SOLN
60.0000 mg | Freq: Once | INTRAMUSCULAR | Status: AC
  Administered 2012-02-19: 60 mg via INTRAVENOUS

## 2012-02-19 MED ORDER — JEVITY 1.2 CAL PO LIQD
1000.0000 mL | ORAL | Status: DC
  Administered 2012-02-19: 1000 mL
  Administered 2012-02-21: 10 mL/h
  Filled 2012-02-19 (×5): qty 1000

## 2012-02-19 NOTE — Progress Notes (Signed)
Name: Melissa Villa MRN: [id number] DOB: [birth year]    LOS: 1  REFERRING PRIVIDER: Glynn Octave PRIMARY CARE: Margarito Liner PULMONARY: Jetty Duhamel CARDIOLOGY: Lewayne Bunting ONCOLOGY: Gus Magrinat CHIEF COMPLAINT:  Respiratory failure   Brief patient description:  66 yo female admitted 11/01 after running out of oxygen at [facility] and developing acute on chronic hypoxic, hypercapnic respiratory failure requiring intubation from AECOPD and acute diastolic heart failure.  Significant PMHx Recurrent breast cancer (dx 01/19/12), COPD, OSA, Chronic hypoxia, Morbid Obesity, HTN, AV block s/p PM, DM type II with retinopathy/neuropathy, Carcinoid s/p RLLectomy  Lines/tubes: ETT 11/01>> Lt Radial aline 11/01>>  Cultures: Sputum 11/01>>  Antibiotics: Avelox (AECOPD) 11/01>>  Significant studies procedures and events:   Level of Care:  ICU Primary Service: PCCM  Consultants:  None Code Status:  Full Diet:  TF 11/2 DVT Px:  SQ heparin GI Px:  Protonix  Interval/ subjective:   Vital Signs: Temp:  [97.9 F (36.6 C)-100.8 F (38.2 C)] 98.4 F (36.9 C) (11/02 0745) Pulse Rate:  [59-88] 88  (11/02 1000) Resp:  [14-27] 24  (11/02 1000) BP: (93-187)/(31-90) 156/70 mmHg (11/02 1000) SpO2:  [93 %-100 %] 100 % (11/02 1000) Arterial Line BP: (120-190)/(40-64) 171/63 mmHg (11/02 1000) FiO2 (%):  [39.8 %-100 %] 40 % (11/02 1000) Weight:  [136.079 kg (300 lb)] 136.079 kg (300 lb) (11/01 1617)  Physical Examination: General:  Obese Neuro:  rass 0, follows commands   HEENT:  ETT, OG tube in place Cardiovascular:  s1s2 regular, distant heart sounds, no murmur Lungs:  Prolonged exhalation improved Abdomen:  Obese, soft, non-tender, decreased bowel sounds, foley in place Musculoskeletal:  No edema Skin:  No rashes  DIAGNOSES: Principal Problem:  *Acute-on-chronic respiratory failure Active Problems:  Acute pulmonary edema  Acute on chronic diastolic congestive heart  failure  Hypertension  COPD with acute exacerbation  OSA (obstructive sleep apnea)  Hyperkalemia  Acute respiratory acidosis  Breast cancer  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  Acute encephalopathy  ASSESSMENT / PLAN: PULMONARY  Lab 02/19/12 0425 02/19/12 0339 02/18/12 2140  PHART 7.327* 7.333* 7.280*  PCO2ART 49.3* 47.7* 57.0*  PO2ART 80.0 78.0* 73.0*  HCO3 25.7* 25.3* 26.4*  O2SAT 94.0 94.0 90.0    Ventilator Settings: Vent Mode:  [-] CPAP FiO2 (%):  [39.8 %-100 %] 40 % Set Rate:  [14 bmp-16 bmp] 14 bmp Vt Set:  [450 mL-500 mL] 450 mL PEEP:  [5 cmH20] 5 cmH20 Pressure Support:  [10 cmH20] 10 cmH20 Plateau Pressure:  [20 cmH20-24 cmH20] 20 cmH20  CXR:  11/02, no sig changes, it prominence   A:  Acute on chronic respiratory failure in setting of AECOPD and acute pulmonary edema with hx of OSA. P:   Wean cpap 5 ps 5, assess at 2 hrs, rsbi, abg Sit upright Avoid auto PEEP>>permissive hypercapnia, no rate increase needed Maintain solumedrol at current dose Schedule bronchodilators Will need CPAP after extubation pcxr in am  consider neg balance  CARDIOVASCULAR  Lab 02/19/12 0423 02/18/12 1652  LATICACIDVEN -- 1.2  O2SATVEN -- --  PROBNP 511.4* 393.6*   ECG:  None noted Trop neg A: Acute pulmonary edema with acute on chronic diastolic dysfunction likely from acute hypoxia with hx of HTN. Afterload contribution for sure on edema P:  Monitor heart rhythm Diureses as tolerated to neg goal 500 cc Hydralazine as needed to keep SBP < 170, add maintenance meds to reduce BP F/u cardiac enzymes - all neg  RENAL  Lab 02/19/12 0422 02/18/12 1606  NA 144 140  K 4.9 5.8*  CL 108 106  CO2 25 24  BUN 21 20  CREATININE 0.98 1.02  CALCIUM 8.7 8.6  MG -- --  PHOS -- --   Intake/Output      11/01 0701 - 11/02 0700 11/02 0701 - 11/03 0700   I.V. (mL/kg) 1515.3 (11.1) 136.2 (1)   NG/GT 120 110   IV Piggyback 250    Total Intake(mL/kg)  1885.3 (13.9) 246.2 (1.8)   Urine (mL/kg/hr) 2225 (0.7) 375   Emesis/NG output 550    Total Output 2775 375   Net -889.7 -128.8          A:  Hyperkalemia>>likely related to acidosis. 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 Anemia of chronic disease. P:  F/u CBC Change to Lovenox with history cancer on vent  INFECTIOUS  Lab 02/19/12 0422 02/18/12 1652 02/18/12 1606  PROCALCITON -- -- --  WBC 10.5 -- 20.0*  LATICACIDVEN -- 1.2 --    A:  AECOPD. Unimpressed PNA P:   D1/x avelox Follow cultures  ENDOCRINE CBG (last 3)   Lab 02/19/12 0743 02/19/12 0407 02/18/12 2350  GLUCAP 121* 138* 120*    A:  DM type II with hyperglycemia.   P:   SSI  NEUROLOGIC  A:  Acute encephalopathy 2nd to acute respiratory failure with hypoxia. P:   Sedation protocol while on vent WUA Upright Early mobility needed  Summary statement:  Weaning, likely not able to extubate. lasix  Ccm time 30 min   Mcarthur Rossetti. Tyson Alias, MD, FACP Pgr: [phone] [facility]

## 2012-02-19 NOTE — ED Provider Notes (Signed)
I saw and evaluated the patient, reviewed the resident's note [name] agree with the findings and plan.  Found unresponsive at [employer] after reportedly running out of O2.  Hx breast CA, COPD, CHF. Intubated by EMS, arrives awake, following commands, scattered wheezing. EMS ETT with cuff leak.  Changed over bougie. Sedation initiated. Nebs, steroids, ABG.  Likely hypercarbic respiratory failure, less likely PE.  CRITICAL CARE Performed by: Glynn Octave   Total critical care time: 45  Critical care time was exclusive of separately billable procedures and treating other patients.  Critical care was necessary to treat or prevent imminent or life-threatening deterioration.  Critical care was time spent personally by me on the following activities: development of treatment plan with patient and/or surrogate as well as nursing, discussions with consultants, evaluation of patient's response to treatment, examination of patient, obtaining history from patient or surrogate, ordering and performing treatments and interventions, ordering and review of laboratory studies, ordering and review of radiographic studies, pulse oximetry and re-evaluation of patient's condition.   Glynn Octave, MD 02/19/12 1220

## 2012-02-19 NOTE — Progress Notes (Signed)
INITIAL ADULT NUTRITION ASSESSMENT Date: 02/19/2012   Time: 1:30 PM  Reason for Assessment: MD Consult for TF management  INTERVENTION:  Change TF to Jevity 1.2 at 20 ml/h, increase by 10 ml every 4 hours to goal rate of 40 ml/h with Prostat 30 ml QID to provide 1552 kcals (63% of estimated needs), 113 gm protein (100% estimated needs), and 778 ml free water daily.  DOCUMENTATION CODES Per approved criteria  -Morbid Obesity   ASSESSMENT: Female 66 y.o.  Dx: Acute-on-chronic respiratory failure; admitted 11/01 after running out of oxygen at [facility] and developing acute on chronic hypoxic, hypercapnic respiratory failure requiring intubation from AECOPD and acute diastolic heart failure   Hx:  Past Medical History  Diagnosis Date  . DM hyperosmolarity type II   . Hypertension   . COPD (chronic obstructive pulmonary disease)   . Diastolic CHF   . TIA (transient ischemic attack)   . OSA (obstructive sleep apnea)   . Chronic respiratory failure with hypoxia   . Anemia   . DJD (degenerative joint disease)   . AV block   . DM retinopathy   . DM neuropathies   . Carcinoid tumor of lung   . Breast cancer     Past Surgical History  Procedure Date  . Mastectomy partial / lumpectomy   . Lobectomy   . Pacemaker insertion   . Cholecystectomy   . Tonsillectomy     Related Meds:  Scheduled Meds:   . albuterol  6 puff Inhalation Q6H  . ipratropium  0.5 mg Nebulization Once   And  . albuterol  5 mg Nebulization Once  . amLODipine  5 mg Oral Daily  . antiseptic oral rinse  15 mL Mouth Rinse QID  . atorvastatin  20 mg Oral q1800  . carvedilol  25 mg Oral BID WC  . chlorhexidine  15 mL Mouth Rinse BID  . dipyridamole-aspirin  1 capsule Oral BID  . enoxaparin (LOVENOX) injection  40 mg Subcutaneous Q24H  . fentaNYL      . fentaNYL  100 mcg Intravenous Once  . furosemide  60 mg Intravenous Once  . furosemide  60 mg Intravenous Once  . hydrALAZINE  25 mg Oral Q8H  . insulin  aspart  0-20 Units Subcutaneous Q4H  . ipratropium  6 puff Inhalation Q6H  . irbesartan  300 mg Oral Daily  . methylPREDNISolone (SOLU-MEDROL) injection  40 mg Intravenous Q12H  . moxifloxacin  400 mg Intravenous Q24H  . pantoprazole sodium  40 mg Per Tube Daily  . DISCONTD: heparin subcutaneous  5,000 Units Subcutaneous Q8H   Continuous Infusions:   . sodium chloride 20 mL/hr at 02/19/12 0900  . feeding supplement (OSMOLITE 1.2 CAL)    . fentaNYL infusion INTRAVENOUS 100 mcg/hr (02/19/12 1200)  . propofol Stopped (02/19/12 0900)  . DISCONTD: propofol Stopped (02/18/12 2003)   PRN Meds:.albuterol, fentaNYL, hydrALAZINE   Ht: 5\' 5"  (165.1 cm)  Wt: 300 lb (136.079 kg)  Ideal Wt: 56.8 kg % Ideal Wt: 240%  Wt Readings from Last 10 Encounters:  02/18/12 300 lb (136.079 kg)   Usual Wt: unknown  Body mass index is 49.92 kg/(m^2).  Food/Nutrition Related Hx: extreme obesity, class III   Labs:  CMP     Component Value Date/Time   NA 144 02/19/2012 0422   K 4.9 02/19/2012 0422   CL 108 02/19/2012 0422   CO2 25 02/19/2012 0422   GLUCOSE 149* 02/19/2012 0422   BUN 21 02/19/2012 0422  CREATININE 0.98 02/19/2012 0422   CALCIUM 8.7 02/19/2012 0422   PROT 7.8 02/18/2012 1606   ALBUMIN 3.0* 02/18/2012 1606   AST 27 02/18/2012 1606   ALT 20 02/18/2012 1606   ALKPHOS 58 02/18/2012 1606   BILITOT 0.2* 02/18/2012 1606   GFRNONAA 60* 02/19/2012 0422   GFRAA 69* 02/19/2012 0422    CBG (last 3)   Basename 02/19/12 1159 02/19/12 0743 02/19/12 0407  GLUCAP 158* 121* 138*     Intake/Output Summary (Last 24 hours) at 02/19/12 1333 Last data filed at 02/19/12 1300  Gross per 24 hour  Intake 2252.51 ml  Output   3370 ml  Net -1117.49 ml    Diet Order:  NPO  Tube Feeding order:  Osmolite 1.2 at 40 ml/h will provide 1152 kcals, 53 gm protein, 787 ml free water daily  IVF:    sodium chloride Last Rate: 20 mL/hr at 02/19/12 0900  feeding supplement (OSMOLITE 1.2 CAL)   fentaNYL  infusion INTRAVENOUS Last Rate: 100 mcg/hr (02/19/12 1200)  propofol Last Rate: Stopped (02/19/12 0900)  DISCONTD: propofol Last Rate: Stopped (02/18/12 2003)    Estimated Nutritional Needs:   Kcal: 2450  Protein: 113-130 gm Fluid: 2-2.4 liters  Patient is currently intubated on ventilator support.  MV: 14.6 Temp:Temp (24hrs), Avg:99.1 F (37.3 C), Min:97.5 F (36.4 C), Max:100.8 F (38.2 C)  Propofol has been discontinued.  OG tube is in place.    NUTRITION DIAGNOSIS: Inadequate oral intake related to inability to eat as evidenced by NPO status.  MONITORING/EVALUATION(Goals):  Goal:  Enteral nutrition to provide 60-70% of estimated calorie needs (22-25 kcals/kg ideal body weight) and >/= 90% of estimated protein needs, based on ASPEN guidelines for permissive underfeeding in critically ill obese individuals.  Monitor:  TF tolerance/adequacy, weight trend, labs, I/O  EDUCATION NEEDS: -Education not appropriate at this time   Joaquin Courts, RD, LDN, CNSC Pager# [phone] After Hours Pager# [phone]  02/19/2012, 1:30 PM

## 2012-02-20 ENCOUNTER — Inpatient Hospital Stay (HOSPITAL_COMMUNITY): Payer: Medicare Other

## 2012-02-20 LAB — COMPREHENSIVE METABOLIC PANEL
ALT: 14 U/L (ref 0–35)
AST: 18 U/L (ref 0–37)
Alkaline Phosphatase: 47 U/L (ref 39–117)
CO2: 28 mEq/L (ref 19–32)
Chloride: 107 mEq/L (ref 96–112)
Creatinine, Ser: 1.15 mg/dL — ABNORMAL HIGH (ref 0.50–1.10)
GFR calc non Af Amer: 49 mL/min — ABNORMAL LOW (ref >90)
Potassium: 5.1 mEq/L (ref 3.5–5.1)
Sodium: 143 mEq/L (ref 135–145)
Total Bilirubin: 0.2 mg/dL — ABNORMAL LOW (ref 0.3–1.2)

## 2012-02-20 LAB — CBC WITH DIFFERENTIAL/PLATELET
Eosinophils Absolute: 0 10*3/uL (ref 0.0–0.7)
Lymphs Abs: 0.9 10*3/uL (ref 0.7–4.0)
MCH: 21.6 pg — ABNORMAL LOW (ref 26.0–34.0)
MCHC: 28.8 g/dL — ABNORMAL LOW (ref 30.0–36.0)
Monocytes Absolute: 0.7 10*3/uL (ref 0.1–1.0)
Neutrophils Relative %: 86 % — ABNORMAL HIGH (ref 43–77)
Platelets: 232 10*3/uL (ref 150–400)
RDW: 22 % — ABNORMAL HIGH (ref 11.5–15.5)

## 2012-02-20 LAB — BLOOD GAS, ARTERIAL
Bicarbonate: 27.6 mEq/L — ABNORMAL HIGH (ref 20.0–24.0)
Patient temperature: 98.6
TCO2: 29.1 mmol/L (ref 0–100)
pH, Arterial: 7.365 (ref 7.350–7.450)

## 2012-02-20 LAB — GLUCOSE, CAPILLARY
Glucose-Capillary: 239 mg/dL — ABNORMAL HIGH (ref 70–99)
Glucose-Capillary: 237 mg/dL — ABNORMAL HIGH (ref 70–99)

## 2012-02-20 MED ORDER — AMLODIPINE BESYLATE 10 MG PO TABS
10.0000 mg | ORAL_TABLET | Freq: Every day | ORAL | Status: DC
  Administered 2012-02-20 – 2012-02-25 (×6): 10 mg via ORAL
  Filled 2012-02-20 (×6): qty 1

## 2012-02-20 MED ORDER — ONDANSETRON HCL 4 MG/2ML IJ SOLN
4.0000 mg | Freq: Four times a day (QID) | INTRAMUSCULAR | Status: DC | PRN
  Administered 2012-02-21: 4 mg via INTRAVENOUS
  Filled 2012-02-20: qty 2

## 2012-02-20 MED ORDER — INSULIN GLARGINE 100 UNIT/ML ~~LOC~~ SOLN
10.0000 [IU] | Freq: Every day | SUBCUTANEOUS | Status: DC
  Administered 2012-02-20 – 2012-02-24 (×5): 10 [IU] via SUBCUTANEOUS

## 2012-02-20 MED ORDER — FUROSEMIDE 10 MG/ML IJ SOLN
40.0000 mg | Freq: Two times a day (BID) | INTRAMUSCULAR | Status: DC
  Administered 2012-02-20 – 2012-02-22 (×4): 40 mg via INTRAVENOUS
  Filled 2012-02-20 (×6): qty 4

## 2012-02-20 MED ORDER — HYDRALAZINE HCL 25 MG PO TABS
25.0000 mg | ORAL_TABLET | Freq: Three times a day (TID) | ORAL | Status: DC
  Administered 2012-02-20 – 2012-02-25 (×15): 25 mg via ORAL
  Filled 2012-02-20 (×19): qty 1

## 2012-02-20 MED ORDER — CHLORHEXIDINE GLUCONATE CLOTH 2 % EX PADS: 6.0000 | MEDICATED_PAD | Freq: Every day | CUTANEOUS | Status: DC

## 2012-02-20 MED ORDER — METOCLOPRAMIDE HCL 5 MG/ML IJ SOLN
5.0000 mg | Freq: Four times a day (QID) | INTRAMUSCULAR | Status: DC
  Administered 2012-02-21 – 2012-02-22 (×6): 5 mg via INTRAVENOUS
  Filled 2012-02-20 (×7): qty 1

## 2012-02-20 MED ORDER — METOCLOPRAMIDE HCL 5 MG/ML IJ SOLN
5.0000 mg | Freq: Four times a day (QID) | INTRAMUSCULAR | Status: DC
  Filled 2012-02-20 (×3): qty 1

## 2012-02-20 MED ORDER — PREDNISONE 20 MG PO TABS
40.0000 mg | ORAL_TABLET | Freq: Every day | ORAL | Status: DC
  Administered 2012-02-21: 40 mg via ORAL
  Filled 2012-02-20 (×2): qty 2

## 2012-02-20 MED ORDER — ONDANSETRON HCL 4 MG/2ML IJ SOLN
INTRAMUSCULAR | Status: AC
  Administered 2012-02-20: 4 mg
  Filled 2012-02-20: qty 2

## 2012-02-20 MED ORDER — MUPIROCIN 2 % EX OINT
1.0000 "application " | TOPICAL_OINTMENT | Freq: Two times a day (BID) | CUTANEOUS | Status: AC
  Administered 2012-02-20 – 2012-02-24 (×10): 1 via NASAL
  Filled 2012-02-20: qty 22

## 2012-02-20 NOTE — Progress Notes (Signed)
Increased PS to 10 til CCM rounds. 

## 2012-02-20 NOTE — Progress Notes (Signed)
.       Name: Melissa Villa MRN: [id number] DOB: Jul 14, 1945    LOS: 2  REFERRING PRIVIDER: Glynn Octave PRIMARY CARE: Margarito Liner PULMONARY: Jetty Duhamel CARDIOLOGY: Lewayne Bunting ONCOLOGY: Gus Magrinat CHIEF COMPLAINT:  Respiratory failure   Brief patient description:  66 yo female admitted 11/01 after running out of oxygen at [facility] and developing acute on chronic hypoxic, hypercapnic respiratory failure requiring intubation from AECOPD and acute diastolic heart failure.  Significant PMHx Recurrent breast cancer (dx 01/19/12), COPD, OSA, Chronic hypoxia, Morbid Obesity, HTN, AV block s/p PM, DM type II with retinopathy/neuropathy, Carcinoid s/p RLLectomy  Lines/tubes: ETT 11/01>> Lt Radial aline 11/01>>  Cultures: Sputum 11/01: few GPC clusters>>> MRSA screen 11/1: positive   Antibiotics: Avelox (AECOPD) 11/01>>  Significant studies procedures and events:   Level of Care:  ICU Primary Service: PCCM  Consultants:  None Code Status:  Full Diet:  TF 11/2 DVT Px:  SQ heparin GI Px:  Protonix  Interval/ subjective:  C/o abd discomfort, vomited today   Vital Signs: Temp:  [97.5 F (36.4 C)-100.2 F (37.9 C)] 98.7 F (37.1 C) (11/03 1619) Pulse Rate:  [77-99] 93  (11/03 1623) Resp:  [14-26] 22  (11/03 1623) BP: (102-156)/(44-65) 156/65 mmHg (11/03 1623) SpO2:  [96 %-100 %] 99 % (11/03 1623) Arterial Line BP: (85-157)/(37-59) 151/59 mmHg (11/03 1200) FiO2 (%):  [39.7 %-99 %] 40 % (11/03 1623) Weight:  [129.7 kg (285 lb 15 oz)] 129.7 kg (285 lb 15 oz) (11/03 0500)  Physical Examination: General:  Obese Neuro:  rass 0, follows commands   HEENT:  ETT, OG tube in place Cardiovascular:  s1s2 regular, distant heart sounds, no murmur Lungs:  Prolonged exhalation, scattered rhonchi and wheeze Abdomen:  Obese, soft, non-tender, decreased bowel sounds, foley in place Musculoskeletal:  No edema Skin:  No rashes  DIAGNOSES: Principal Problem:  *Acute-on-chronic respiratory failure Active Problems:  Acute pulmonary edema  Acute on chronic diastolic congestive heart failure  Hypertension  COPD with acute exacerbation  OSA (obstructive sleep apnea)  Hyperkalemia  Acute respiratory acidosis  Breast cancer  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  Acute encephalopathy  ASSESSMENT / PLAN: PULMONARY  Lab 02/20/12 0419 02/19/12 0425 02/19/12 0339  PHART 7.365 7.327* 7.333*  PCO2ART 49.4* 49.3* 47.7*  PO2ART 106.0* 80.0 78.0*  HCO3 27.6* 25.7* 25.3*  O2SAT 95.6 94.0 94.0    Ventilator Settings: Vent Mode:  [-] CPAP FiO2 (%):  [39.7 %-99 %] 40 % Set Rate:  [14 bmp] 14 bmp Vt Set:  [450 mL] 450 mL PEEP:  [4 cmH20-5 cmH20] 5 cmH20 Pressure Support:  [5 cmH20-10 cmH20] 10 cmH20 Plateau Pressure:  [18 cmH20-21 cmH20] 20 cmH20  CXR:  11/03: right > left airspace disease. Worsening atx, worsening R>L airspace disease.    A:  Acute on chronic respiratory failure in setting of AECOPD and acute pulmonary edema with hx of OSA. Suspect she has some component of OHS/OSA and decompensated cor pulmonale. CXR w/ worsening aeration, mostly edema and atx, prob small bilateral effusions.  P:   Wean as tolerated, cpap 5ps 5, goal 2 hrs May hold off extubation with new vomiting and rt base changes Push diuretics (need negative balance) Avoid auto PEEP>>permissive hypercapnia, no rate increase needed Wean steroids.  Schedule bronchodilators Will need CPAP after extubation pcxr in am   CARDIOVASCULAR  Lab 02/19/12 0423 02/18/12 1652  LATICACIDVEN -- 1.2  O2SATVEN -- --  PROBNP 511.4* 393.6*   ECG:  None  noted Trop neg A: Acute pulmonary edema with acute on chronic diastolic dysfunction likely from acute hypoxia with hx of HTN. Afterload contribution for sure on edema P:  Monitor heart rhythm Cont current BP meds Diuresis   RENAL  Lab 02/20/12 0400 02/19/12 0422 02/18/12 1606  NA 143 144 140  K 5.1 4.9  5.8*  CL 107 108 106  CO2 28 25 24   BUN 35* 21 20  CREATININE 1.15* 0.98 1.02  CALCIUM 8.2* 8.7 8.6  MG -- -- --  PHOS -- -- --   Intake/Output      11/02 0701 - 11/03 0700 11/03 0701 - 11/04 0700   I.V. (mL/kg) 963.6 (7.4) 108.4 (0.8)   NG/GT 1020 230   IV Piggyback 256    Total Intake(mL/kg) 2239.6 (17.3) 338.4 (2.6)   Urine (mL/kg/hr) 2905 (0.9) 500 (0.4)   Emesis/NG output     Total Output 2905 500   Net -665.4 -161.6          A:   Hyperkalemia Mild AKI, suspect HTN P:   F/u BMET after lasix, may need to hold if crt bumps Monitor urine outpt Repeat lasix   GASTROINTESTINAL  Lab 02/20/12 0400 02/18/12 1606  AST 18 27  ALT 14 20  ALKPHOS 47 58  PROT 6.9 7.8  ALBUMIN 2.7* 3.0*    A:  Nausea/vomiting. Think she gagged on ETT.  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 Anemia of chronic disease. P:  F/u CBC Cont Lovenox with history cancer on vent, follow crt clearnace   INFECTIOUS  Lab 02/20/12 0400 02/19/12 0422 02/18/12 1652 02/18/12 1606  PROCALCITON -- -- -- --  WBC 11.5* 10.5 -- 20.0*  LATICACIDVEN -- -- 1.2 --    A:  AECOPD. Unimpressed PNA Prelim sputum GPC... Will need to watch  P:   D2/x avelox Follow cultures  ENDOCRINE CBG (last 3)   Lab 02/20/12 1158 02/20/12 0732 02/20/12 0338 02/19/12 2346 02/19/12 1948  GLUCAP 239* 203* 255* 189* 173*    A:  DM type II with hyperglycemia.   P:   SSI Taper steroids  NEUROLOGIC  A:  Acute encephalopathy 2nd to acute respiratory failure with hypoxia. Now awake and appropriate. Follows commands  P:   Sedation protocol while on vent WUA Upright Early mobility needed  Summary statement:  Weaning, still volume overloaded. OHS and  atx also major issues. Will cont to push diuresis as renals will tolerate. Hope we can extubate in next 24 hrs not at that point yet.   Ccm time 30 min   Mcarthur Rossetti. Tyson Alias, MD, FACP Pgr: [phone] [facility]

## 2012-02-21 ENCOUNTER — Inpatient Hospital Stay (HOSPITAL_COMMUNITY): Payer: Medicare Other

## 2012-02-21 LAB — CBC
HCT: 28.9 % — ABNORMAL LOW (ref 36.0–46.0)
Hemoglobin: 8.2 g/dL — ABNORMAL LOW (ref 12.0–15.0)
RBC: 3.82 MIL/uL — ABNORMAL LOW (ref 3.87–5.11)
WBC: 10.8 10*3/uL — ABNORMAL HIGH (ref 4.0–10.5)

## 2012-02-21 LAB — CULTURE, RESPIRATORY W GRAM STAIN

## 2012-02-21 LAB — COMPREHENSIVE METABOLIC PANEL
ALT: 12 U/L (ref 0–35)
Alkaline Phosphatase: 43 U/L (ref 39–117)
BUN: 37 mg/dL — ABNORMAL HIGH (ref 6–23)
Chloride: 108 mEq/L (ref 96–112)
GFR calc Af Amer: 70 mL/min — ABNORMAL LOW (ref >90)
Glucose, Bld: 112 mg/dL — ABNORMAL HIGH (ref 70–99)
Potassium: 4.3 mEq/L (ref 3.5–5.1)
Sodium: 145 mEq/L (ref 135–145)
Total Bilirubin: 0.2 mg/dL — ABNORMAL LOW (ref 0.3–1.2)
Total Protein: 6.9 g/dL (ref 6.0–8.3)

## 2012-02-21 LAB — GLUCOSE, CAPILLARY
Glucose-Capillary: 139 mg/dL — ABNORMAL HIGH (ref 70–99)
Glucose-Capillary: 216 mg/dL — ABNORMAL HIGH (ref 70–99)
Glucose-Capillary: 238 mg/dL — ABNORMAL HIGH (ref 70–99)

## 2012-02-21 LAB — TRIGLYCERIDES: Triglycerides: 177 mg/dL — ABNORMAL HIGH (ref <150)

## 2012-02-21 MED ORDER — PREDNISONE 20 MG PO TABS
30.0000 mg | ORAL_TABLET | Freq: Every day | ORAL | Status: DC
  Administered 2012-02-22 – 2012-02-23 (×2): 30 mg via ORAL
  Filled 2012-02-21 (×3): qty 1

## 2012-02-21 MED ORDER — ALBUTEROL SULFATE (5 MG/ML) 0.5% IN NEBU
2.5000 mg | INHALATION_SOLUTION | Freq: Four times a day (QID) | RESPIRATORY_TRACT | Status: DC
  Administered 2012-02-21 – 2012-02-25 (×15): 2.5 mg via RESPIRATORY_TRACT
  Filled 2012-02-21 (×16): qty 0.5

## 2012-02-21 MED ORDER — IPRATROPIUM BROMIDE 0.02 % IN SOLN
0.5000 mg | Freq: Four times a day (QID) | RESPIRATORY_TRACT | Status: DC
  Administered 2012-02-21 – 2012-02-25 (×15): 0.5 mg via RESPIRATORY_TRACT
  Filled 2012-02-21 (×16): qty 2.5

## 2012-02-21 MED ORDER — PANTOPRAZOLE SODIUM 40 MG PO PACK
40.0000 mg | PACK | Freq: Every day | ORAL | Status: DC
  Administered 2012-02-22 – 2012-02-25 (×3): 40 mg via ORAL
  Filled 2012-02-21 (×6): qty 20

## 2012-02-21 MED ORDER — INSULIN ASPART 100 UNIT/ML ~~LOC~~ SOLN
0.0000 [IU] | Freq: Three times a day (TID) | SUBCUTANEOUS | Status: DC
  Administered 2012-02-22: 7 [IU] via SUBCUTANEOUS
  Administered 2012-02-22: 3 [IU] via SUBCUTANEOUS
  Administered 2012-02-22: 4 [IU] via SUBCUTANEOUS
  Administered 2012-02-23: 7 [IU] via SUBCUTANEOUS
  Administered 2012-02-23: 11 [IU] via SUBCUTANEOUS
  Administered 2012-02-23: 4 [IU] via SUBCUTANEOUS
  Administered 2012-02-24: 7 [IU] via SUBCUTANEOUS
  Administered 2012-02-24: 4 [IU] via SUBCUTANEOUS
  Administered 2012-02-24: 11 [IU] via SUBCUTANEOUS
  Administered 2012-02-25: 15 [IU] via SUBCUTANEOUS

## 2012-02-21 MED ORDER — ACETAMINOPHEN 325 MG PO TABS
650.0000 mg | ORAL_TABLET | ORAL | Status: DC | PRN
  Administered 2012-02-21 (×2): 650 mg via ORAL
  Filled 2012-02-21 (×4): qty 2

## 2012-02-21 MED ORDER — OXYCODONE HCL 5 MG PO TABS
5.0000 mg | ORAL_TABLET | Freq: Four times a day (QID) | ORAL | Status: DC | PRN
  Administered 2012-02-21 (×2): 5 mg via ORAL
  Filled 2012-02-21 (×3): qty 1

## 2012-02-21 MED ORDER — INSULIN ASPART 100 UNIT/ML ~~LOC~~ SOLN
3.0000 [IU] | Freq: Three times a day (TID) | SUBCUTANEOUS | Status: DC
  Administered 2012-02-22 – 2012-02-25 (×9): 3 [IU] via SUBCUTANEOUS

## 2012-02-21 MED ORDER — ALBUTEROL SULFATE (5 MG/ML) 0.5% IN NEBU: 2.5000 mg | INHALATION_SOLUTION | RESPIRATORY_TRACT | Status: DC | PRN

## 2012-02-21 MED ORDER — IPRATROPIUM BROMIDE 0.02 % IN SOLN: 0.5000 mg | RESPIRATORY_TRACT | Status: DC | PRN

## 2012-02-21 MED ORDER — CHLORHEXIDINE GLUCONATE CLOTH 2 % EX PADS
6.0000 | MEDICATED_PAD | Freq: Every day | CUTANEOUS | Status: AC
  Administered 2012-02-21 – 2012-02-23 (×3): 6 via TOPICAL

## 2012-02-21 MED ORDER — INSULIN ASPART 100 UNIT/ML ~~LOC~~ SOLN
0.0000 [IU] | Freq: Every day | SUBCUTANEOUS | Status: DC
  Administered 2012-02-22 – 2012-02-24 (×2): 2 [IU] via SUBCUTANEOUS

## 2012-02-21 NOTE — Procedures (Signed)
Extubation Procedure Note  Patient Details:   Name: Melissa Villa DOB: Jun 10, 1945 MRN: [id number]   Airway Documentation:     Evaluation  O2 sats: stable throughout Complications: No apparent complications Patient did tolerate procedure well. Bilateral Breath Sounds: Rhonchi;Diminished Suctioning: Airway Yes  Pt extubated per MD order.  Cuff leak positive prior to extubation.  Placed on 3l  post extubation.  No stridor noted; no complications noted.  Patient able to vocalize well.  IS instructed.  Will continue to monitor.  Lysbeth Penner [facility] 02/21/2012, 11:07 AM

## 2012-02-21 NOTE — Progress Notes (Signed)
200 ml of fentanyl drip and 60 ml of propofol drips wasted into sink, witnessed by [employer]

## 2012-02-21 NOTE — Progress Notes (Signed)
.       Name: Melissa Villa MRN: [id number] DOB: 1946-02-21    LOS: 3  REFERRING PRIVIDER: Glynn Octave PRIMARY CARE: Margarito Liner PULMONARY: Jetty Duhamel CARDIOLOGY: Lewayne Bunting ONCOLOGY: Gus Magrinat CHIEF COMPLAINT:  Respiratory failure   Brief patient description:  66 yo female admitted 11/01 after running out of oxygen at [facility] and developing acute on chronic hypoxic, hypercapnic respiratory failure requiring intubation from AECOPD and acute diastolic heart failure.  Significant PMHx Recurrent breast cancer (dx 01/19/12), COPD, OSA, Chronic hypoxia, Morbid Obesity, HTN, AV block s/p PM, DM type II with retinopathy/neuropathy, Carcinoid s/p RLLectomy  Lines/tubes: ETT 11/01>>11/04 Lt Radial aline 11/01>>11/04  Cultures: Sputum 11/01>>> MRSA screen 11/1>>POSITIVE  Antibiotics: Avelox (AECOPD) 11/01>>  Significant studies procedures and events:   Level of Care:  ICU Primary Service: PCCM  Consultants:  None Code Status:  Full Diet:  TF 11/2 DVT Px:  SQ heparin GI Px:  Protonix  Interval/ subjective:  Tolerating SBT.  C/o abd discomfort.  Denies chest pain.  Vital Signs: Temp:  [98.7 F (37.1 C)-99.4 F (37.4 C)] 99.3 F (37.4 C) (11/04 0730) Pulse Rate:  [72-99] 84  (11/04 1000) Resp:  [14-26] 23  (11/04 1000) BP: (109-158)/(49-71) 139/56 mmHg (11/04 1000) SpO2:  [98 %-100 %] 98 % (11/04 1000) Arterial Line BP: (108-177)/(46-70) 161/56 mmHg (11/04 1000) FiO2 (%):  [39.8 %-99 %] 40 % (11/04 1000) Weight:  [288 lb 9.3 oz (130.9 kg)] 288 lb 9.3 oz (130.9 kg) (11/04 0500)  Physical Examination: General:  No distress Neuro:  Alert, follows commands HEENT:  ETT, OG tube in place Cardiovascular:  s1s2 regular, distant heart sounds, no murmur Lungs:  Prolonged exhalation, scattered rhonchi, no wheeze Abdomen:  Obese, soft, non-tender, decreased bowel sounds Musculoskeletal:  No edema Skin:  No rashes  DIAGNOSES: Principal Problem:  *Acute-on-chronic respiratory failure Active Problems:  Acute pulmonary edema  Acute on chronic diastolic congestive heart failure  Hypertension  COPD with acute exacerbation  OSA (obstructive sleep apnea)  Hyperkalemia  Acute respiratory acidosis  Breast cancer  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  Acute encephalopathy  ASSESSMENT / PLAN: PULMONARY  Lab 02/20/12 0419 02/19/12 0425 02/19/12 0339  PHART 7.365 7.327* 7.333*  PCO2ART 49.4* 49.3* 47.7*  PO2ART 106.0* 80.0 78.0*  HCO3 27.6* 25.7* 25.3*  O2SAT 95.6 94.0 94.0    Ventilator Settings: Vent Mode:  [-] CPAP;PSV FiO2 (%):  [39.8 %-99 %] 40 % Set Rate:  [14 bmp] 14 bmp Vt Set:  [450 mL] 450 mL PEEP:  [5 cmH20] 5 cmH20 Pressure Support:  [5 cmH20-10 cmH20] 5 cmH20 Plateau Pressure:  [18 cmH20-20 cmH20] 18 cmH20  CXR:  11/04>>ATX, mild PVC   A:  Acute on chronic respiratory failure in setting of AECOPD and acute pulmonary edema with hx of OSA.  P:   Proceed with extubation Adjust oxygen to keep SpO2 > 92% CPAP qhs Continue scheduled BD's Wean of prednisone as tolerated  CARDIOVASCULAR  Lab 02/19/12 0423 02/18/12 1652  LATICACIDVEN -- 1.2  O2SATVEN -- --  PROBNP 511.4* 393.6*    A: Acute pulmonary edema with acute on chronic diastolic dysfunction likely from acute hypoxia with hx of HTN. P:  Continue current anti-HTN meds Goal negative fluid balance  RENAL  Lab 02/21/12 0500 02/20/12 0400 02/19/12 0422  NA 145 143 144  K 4.3 5.1 4.9  CL 108 107 108  CO2 31 28 25   BUN 37* 35* 21  CREATININE 0.97 1.15* 0.98  CALCIUM 8.6 8.2* 8.7  MG -- -- --  PHOS -- -- --   Intake/Output      11/03 0701 - 11/04 0700 11/04 0701 - 11/05 0700   I.V. (mL/kg) 696.6 (5.3) 77.2 (0.6)   NG/GT 420 30   IV Piggyback  2   Total Intake(mL/kg) 1116.6 (8.5) 109.2 (0.8)   Urine (mL/kg/hr) 2265 (0.7) 325   Total Output 2265 325   Net -1148.5 -215.8        Emesis Occurrence 1 x      A:  Acute  renal insufficiency, Hyperkalemia. Improved. P:   F/u renal fx, urine outpt, electrolytes  GASTROINTESTINAL  Lab 02/21/12 0500 02/20/12 0400 02/18/12 1606  AST 17 18 27   ALT 12 14 20   ALKPHOS 43 47 58  PROT 6.9 6.9 7.8  ALBUMIN 2.6* 2.7* 3.0*    A: Nausea/vomiting 11/03. 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 Anemia of chronic disease. P:  F/u CBC Continue Lovenox for DVT proph  INFECTIOUS  Lab 02/21/12 0500 02/20/12 0400 02/19/12 0422 02/18/12 1652 02/18/12 1606  PROCALCITON -- -- -- -- --  WBC 10.8* 11.5* 10.5 -- 20.0*  LATICACIDVEN -- -- -- 1.2 --    A:  AECOPD.  P:   D3/x avelox Follow cultures  ENDOCRINE CBG (last 3)   Lab 02/21/12 0729 02/21/12 0336 02/20/12 2344 02/20/12 1928 02/20/12 1550  GLUCAP 129* 103* 139* 183* 237*    A:  DM type II with steroid induced hyperglycemia.   P:   SSI  NEUROLOGIC  A:  Acute encephalopathy 2nd to acute respiratory failure with hypoxia. Resolved 11/04. P:   Monitor clinically   Critical care time 35 minutes.  Coralyn Helling, MD [facility] 02/21/2012, 10:53 AM Pager:  [phone] After 3pm call: [phone]

## 2012-02-21 NOTE — Progress Notes (Signed)
eLink Physician-Brief Progress Note Patient Name: Melissa Villa DOB: 1946-02-02 MRN: [id number]  Date of Service  02/21/2012   HPI/Events of Note   Hyperglycemia   eICU Interventions  SSI adjusted to cover meals and HS; basal insulin added      ALBON,DANA 02/21/2012, 3:42 PM

## 2012-02-21 NOTE — Progress Notes (Signed)
eLink Physician-Brief Progress Note Patient Name: Melissa Villa DOB: 16-May-1945 MRN: [id number]  Date of Service  02/21/2012   HPI/Events of Note   Headache not responding to tylenol  eICU Interventions  Start Oxycodne      [facility] 02/21/2012, 5:12 PM

## 2012-02-21 NOTE — Progress Notes (Signed)
eLink Physician-Brief Progress Note Patient Name: [name] DOB: 02/13/1946 MRN: [id number]  Date of Service  02/21/2012   HPI/Events of Note   Headache   eICU Interventions  Start Tylenol, if not helping we will consider oxycodone       Alyzabeth Pontillo 02/21/2012, 3:27 PM

## 2012-02-22 ENCOUNTER — Inpatient Hospital Stay (HOSPITAL_COMMUNITY): Payer: Medicare Other

## 2012-02-22 LAB — BASIC METABOLIC PANEL
Calcium: 8.6 mg/dL (ref 8.4–10.5)
GFR calc non Af Amer: 72 mL/min — ABNORMAL LOW (ref >90)
Glucose, Bld: 111 mg/dL — ABNORMAL HIGH (ref 70–99)
Sodium: 140 mEq/L (ref 135–145)

## 2012-02-22 LAB — CBC
MCH: 22.1 pg — ABNORMAL LOW (ref 26.0–34.0)
Platelets: 146 10*3/uL — ABNORMAL LOW (ref 150–400)
RBC: 4.08 MIL/uL (ref 3.87–5.11)
WBC: 10.5 10*3/uL (ref 4.0–10.5)

## 2012-02-22 LAB — GLUCOSE, CAPILLARY
Glucose-Capillary: 152 mg/dL — ABNORMAL HIGH (ref 70–99)
Glucose-Capillary: 222 mg/dL — ABNORMAL HIGH (ref 70–99)

## 2012-02-22 MED ORDER — TRAMADOL HCL 50 MG PO TABS
50.0000 mg | ORAL_TABLET | Freq: Four times a day (QID) | ORAL | Status: DC | PRN
  Administered 2012-02-22 – 2012-02-23 (×4): 50 mg via ORAL
  Filled 2012-02-22 (×4): qty 1

## 2012-02-22 MED ORDER — FUROSEMIDE 40 MG PO TABS
40.0000 mg | ORAL_TABLET | Freq: Two times a day (BID) | ORAL | Status: DC
  Administered 2012-02-22 – 2012-02-25 (×7): 40 mg via ORAL
  Filled 2012-02-22 (×9): qty 1

## 2012-02-22 MED ORDER — SALINE SPRAY 0.65 % NA SOLN
1.0000 | Freq: Three times a day (TID) | NASAL | Status: DC
  Administered 2012-02-22 – 2012-02-25 (×10): 1 via NASAL
  Filled 2012-02-22: qty 44

## 2012-02-22 MED ORDER — OXYCODONE HCL 5 MG PO TABS
5.0000 mg | ORAL_TABLET | Freq: Four times a day (QID) | ORAL | Status: DC | PRN
  Administered 2012-02-22 (×3): 10 mg via ORAL
  Administered 2012-02-22: 5 mg via ORAL
  Administered 2012-02-23 – 2012-02-24 (×4): 10 mg via ORAL
  Administered 2012-02-24 (×2): 5 mg via ORAL
  Administered 2012-02-25 (×2): 10 mg via ORAL
  Filled 2012-02-22 (×11): qty 2

## 2012-02-22 MED ORDER — GLUCERNA SHAKE PO LIQD
237.0000 mL | Freq: Two times a day (BID) | ORAL | Status: DC
  Administered 2012-02-22 – 2012-02-25 (×5): 237 mL via ORAL

## 2012-02-22 MED ORDER — BEPOTASTINE BESILATE 1.5 % OP SOLN: 1.0000 [drp] | Freq: Two times a day (BID) | OPHTHALMIC | Status: DC

## 2012-02-22 MED ORDER — FLUTICASONE PROPIONATE 50 MCG/ACT NA SUSP
2.0000 | Freq: Every day | NASAL | Status: DC
  Administered 2012-02-22 – 2012-02-25 (×4): 2 via NASAL
  Filled 2012-02-22: qty 16

## 2012-02-22 MED ORDER — ACETAMINOPHEN 325 MG PO TABS
650.0000 mg | ORAL_TABLET | Freq: Four times a day (QID) | ORAL | Status: DC | PRN
  Administered 2012-02-22 – 2012-02-24 (×3): 650 mg via ORAL
  Filled 2012-02-22 (×3): qty 2

## 2012-02-22 MED ORDER — LORATADINE 10 MG PO TABS
10.0000 mg | ORAL_TABLET | Freq: Every day | ORAL | Status: DC
  Administered 2012-02-22 – 2012-02-25 (×4): 10 mg via ORAL
  Filled 2012-02-22 (×4): qty 1

## 2012-02-22 MED ORDER — MOXIFLOXACIN HCL 400 MG PO TABS
400.0000 mg | ORAL_TABLET | Freq: Every day | ORAL | Status: AC
  Administered 2012-02-22 – 2012-02-23 (×2): 400 mg via ORAL
  Filled 2012-02-22 (×2): qty 1

## 2012-02-22 NOTE — Evaluation (Signed)
Physical Therapy Evaluation Melissa Details Name: Melissa Villa MRN: [id number] DOB: 1945/08/25 Today's Date: 02/22/2012 Time: 2130-8657 PT Time Calculation (min): 32 min  PT Assessment / Plan / Recommendation Clinical Impression  Melissa Villa is 66 y/o female admitted with ARF, ETT 11/1-11/4. Presents to PT today with generalized weakness and decreased activity tolerance limiting her independence with gait and transfers. Will benefit physical therapy in the acute setting to [facility] safety and independence by addressing below impairments. At this time, given pt's lack of support I rec short term rehab to maximize functional independence in prep for d/c home alone. Rec OT consult.    PT Assessment  Melissa needs continued PT services    Follow Up Recommendations  SNF    Does the Melissa have the potential to tolerate intense rehabilitation      Barriers to Discharge Decreased caregiver support      Equipment Recommendations       Recommendations for Other Services OT consult   Frequency Min 3X/week    Precautions / Restrictions Precautions Precaution Comments: O2 Restrictions Weight Bearing Restrictions: No   Pertinent Vitals/Pain Increased dyspnea on exertion 2-3/4      Mobility  Bed Mobility Bed Mobility: Not assessed Details for Bed Mobility Assistance: pt sitting EOB on arrival Transfers Transfers: Sit to Stand;Stand to Sit Sit to Stand: 4: Min assist;From bed;From toilet;From chair/3-in-1;With upper extremity assist;With armrests Stand to Sit: To chair/3-in-1;To toilet;To bed;With upper extremity assist;With armrests Details for Transfer Assistance: cues for safe hand placement with regards to RW, minA for stability, increased physical assist to rise from lower surfaces (chair/toilet) Ambulation/Gait Ambulation/Gait Assistance: 4: Min assist Ambulation Distance (Feet): 20 Feet Assistive device: Rolling walker Ambulation/Gait Assistance Details: cues for tall  posture and safe positioning within RW; assist to negotiate RW specifically in tight spaces (bathroom) Gait Pattern: Trunk flexed;Shuffle              PT Diagnosis: Difficulty walking;Abnormality of gait;Generalized weakness;Acute pain  PT Problem List: Decreased strength;Decreased activity tolerance;Decreased mobility;Pain;Obesity;Cardiopulmonary status limiting activity;Decreased knowledge of use of DME PT Treatment Interventions: DME instruction;Stair training;Functional mobility training;Therapeutic exercise;Therapeutic activities;Gait training;Balance training;Neuromuscular re-education;Cognitive remediation;Melissa/family education   PT Goals Acute Rehab PT Goals PT Villa Formulation: With Melissa Time For Villa Achievement: 02/29/12 Potential to Achieve Goals: Good Pt will go Sit to Stand: with modified independence PT Villa: Sit to Stand - Progress: Villa set today Pt will go Stand to Sit: with modified independence PT Villa: Stand to Sit - Progress: Villa set today Pt will Transfer Bed to Chair/Chair to Bed: with modified independence PT Transfer Villa: Bed to Chair/Chair to Bed - Progress: Villa set today Pt will Ambulate: >150 feet;with modified independence;with least restrictive assistive device PT Villa: Ambulate - Progress: Villa set today Pt will Go Up / Down Stairs: 3-5 stairs;with modified independence;with least restrictive assistive device PT Villa: Up/Down Stairs - Progress: Villa set today Pt will Perform Home Exercise Program: Independently PT Villa: Perform Home Exercise Program - Progress: Villa set today  Visit Information  Last PT Received On: 02/22/12 Assistance Needed: +1    Subjective Data  Subjective: My head has hurt since I came to the hospital.  Melissa Villa: to do rehab prior to home   Prior Functioning  Home Living Lives With: Alone Available Help at Discharge: Family;Available PRN/intermittently Type of Home: House Home Access: Stairs to  enter [employer] of Steps: 3-5 Entrance Stairs-Rails: Right Home Layout: One level Bathroom Shower/Tub: Engineer, manufacturing systems: Standard Home  Adaptive Equipment: Shower chair with back Prior Function Level of Independence: Independent (modified with rest breaks) Able to Take Stairs?: Yes Driving: Yes Vocation: Retired Comments: pt reports she had little energy at home prior to admission and that it would take her an hour an a half to make her bed Communication Communication: No difficulties    Cognition  Overall Cognitive Status: Appears within functional limits for tasks assessed/performed Arousal/Alertness: Awake/alert Orientation Level: Appears intact for tasks assessed Behavior During Session: [facility] for tasks performed    Extremity/Trunk Assessment Right Upper Extremity Assessment RUE ROM/Strength/Tone: [facility] for tasks assessed Left Upper Extremity Assessment LUE ROM/Strength/Tone: [facility] for tasks assessed Right Lower Extremity Assessment RLE ROM/Strength/Tone: [facility] for tasks assessed Left Lower Extremity Assessment LLE ROM/Strength/Tone: [facility] for tasks assessed   Balance    End of Session PT - End of Session Equipment Utilized During Treatment: Gait belt;Oxygen Activity Tolerance: Melissa limited by fatigue Melissa left: in bed;with call bell/phone within reach;Other (comment) (sitting EOB) Nurse Communication: Melissa requests pain meds  GP     [facility] HELEN 02/22/2012, 3:35 PM

## 2012-02-22 NOTE — Progress Notes (Signed)
Received from 2300 to [address];room 2001. Assessed per flow sheet. Denies chest pain or shortness of breath; on 2L nasal cannula. VSS. Call bell near. Melissa Villa

## 2012-02-22 NOTE — Progress Notes (Signed)
Dr. Marchelle Gearing called concerning pt headache and bloodshot right eye. CT orders received, awaiting call from CT for transport.

## 2012-02-22 NOTE — Progress Notes (Signed)
eLink Physician-Brief Progress Note Patient Name: Asmita Demarchi DOB: 1945-08-25 MRN: [id number]  Date of Service  02/22/2012   HPI/Events of Note  Intractable headache behind rt eye subconj hge  Neuro intact No blindness  eICU Interventions  Dc lovenox Ct head Oxy IR for headache   Intervention Category Major Interventions: Other:  Floreine Kingdon 02/22/2012, 12:23 AM

## 2012-02-22 NOTE — Progress Notes (Addendum)
Nutrition Follow-up  Intervention:    Glucerna Shake supplement twice daily between meals (220 kcals, 9.9 gm protein per 8 fl oz can) RD to follow for nutrition care plan  Assessment:   Patient extubated 11/4.  Patient states her appetite is not very good.  She doesn't like the hospital food.  PO intake 25-50% per flowsheet records.  Would like Glucerna Shake supplements during hospitalization -- RD to order.  Diet Order:  Carbohydrate Modified Medium Calorie  Meds: Scheduled Meds:   . ipratropium  0.5 mg Nebulization Q6H   And  . albuterol  2.5 mg Nebulization Q6H  . amLODipine  10 mg Oral Daily  . atorvastatin  20 mg Oral q1800  . Bepotastine Besilate  1 drop Both Eyes BID  . carvedilol  25 mg Oral BID WC  . Chlorhexidine Gluconate Cloth  6 each Topical Q0600  . dipyridamole-aspirin  1 capsule Oral BID  . fluticasone  2 spray Each Nare Daily  . furosemide  40 mg Oral BID  . hydrALAZINE  25 mg Oral Q8H  . insulin aspart  0-20 Units Subcutaneous TID WC  . insulin aspart  0-5 Units Subcutaneous QHS  . insulin aspart  3 Units Subcutaneous TID WC  . insulin glargine  10 Units Subcutaneous QHS  . irbesartan  300 mg Oral Daily  . loratadine  10 mg Oral Daily  . moxifloxacin  400 mg Oral Q2000  . mupirocin ointment  1 application Nasal BID  . pantoprazole sodium  40 mg Oral Daily  . predniSONE  30 mg Oral Q breakfast  . sodium chloride  1 spray Each Nare TID  . [DISCONTINUED] antiseptic oral rinse  15 mL Mouth Rinse QID  . [DISCONTINUED] chlorhexidine  15 mL Mouth Rinse BID  . [DISCONTINUED] enoxaparin (LOVENOX) injection  40 mg Subcutaneous Q24H  . [DISCONTINUED] furosemide  40 mg Intravenous Q12H  . [DISCONTINUED] insulin aspart  0-20 Units Subcutaneous Q4H  . [DISCONTINUED] metoCLOPramide (REGLAN) injection  5 mg Intravenous Q6H  . [DISCONTINUED] moxifloxacin  400 mg Intravenous Q24H   Continuous Infusions:   . [DISCONTINUED] sodium chloride 20 mL/hr (02/21/12 2242)    PRN Meds:.acetaminophen, albuterol, hydrALAZINE, ipratropium, ondansetron (ZOFRAN) IV, oxyCODONE, traMADol, [DISCONTINUED] acetaminophen, [DISCONTINUED] oxyCODONE   CMP     Component Value Date/Time   NA 140 02/22/2012 0430   K 4.2 02/22/2012 0430   CL 101 02/22/2012 0430   CO2 23 02/22/2012 0430   GLUCOSE 111* 02/22/2012 0430   BUN 28* 02/22/2012 0430   CREATININE 0.84 02/22/2012 0430   CALCIUM 8.6 02/22/2012 0430   PROT 6.9 02/21/2012 0500   ALBUMIN 2.6* 02/21/2012 0500   AST 17 02/21/2012 0500   ALT 12 02/21/2012 0500   ALKPHOS 43 02/21/2012 0500   BILITOT 0.2* 02/21/2012 0500   GFRNONAA 72* 02/22/2012 0430   GFRAA 83* 02/22/2012 0430    CBG (last 3)   Basename 02/22/12 1139 02/22/12 0746 02/21/12 2231  GLUCAP 208* 150* 177*     Intake/Output Summary (Last 24 hours) at 02/22/12 1517 Last data filed at 02/22/12 1100  Gross per 24 hour  Intake   1679 ml  Output   2970 ml  Net  -1291 ml    Weight Status:  128.8 kg (11/5) -- fluctuating  Re-estimated needs:  1800-2000 kcals, 90-100 gm protein  Nutrition Dx:  Inadequate Oral Intake now r/t decreased appetite, dislike of hospital food as evidenced by PO intake 25-50%, ongoing  New Goal:  Oral intake with  meals & supplements to meet >/= 90% of estimated nutrition needs, currently unmet  Monitor:  PO & supplemental intake, weight, labs, I/O's  Kirkland Hun, RD, LDN Pager #: [phone] After-Hours Pager #: [phone]

## 2012-02-22 NOTE — Progress Notes (Signed)
.       Name: Melissa Villa MRN: [id number] DOB: Mar 04, 1946    LOS: 4  REFERRING PRIVIDER: Glynn Octave PRIMARY CARE: Margarito Liner PULMONARY: Jetty Duhamel CARDIOLOGY: Lewayne Bunting ONCOLOGY: Gus Magrinat CHIEF COMPLAINT:  Respiratory failure   Brief patient description:  66 yo female admitted 11/01 after running out of oxygen at [facility] and developing acute on chronic hypoxic, hypercapnic respiratory failure requiring intubation from AECOPD and acute diastolic heart failure.  Significant PMHx Recurrent breast cancer (dx 01/19/12), COPD, OSA, Chronic hypoxia, Morbid Obesity, HTN, AV block s/p PM, DM type II with retinopathy/neuropathy, Carcinoid s/p RLLectomy  Lines/tubes: ETT 11/01>>11/04 Lt Radial aline 11/01>>11/04  Cultures: Sputum 11/01>>>oral flora MRSA screen 11/1>>POSITIVE  Antibiotics: Avelox (AECOPD) 11/01>>  Significant studies procedures and events:  11/05 CT head>>no acute process, likely mild small vessel ischemic microangiopathy and chronic lacunar infarct within the right pons, scattered small lucencies in the calvarium, of uncertain significance.    Level of Care:  ICU Primary Service: PCCM  Consultants:  None Code Status:  Full Diet:  Carb modified DVT Px:  Lovenox GI Px:  Protonix  Interval/ subjective:  C/o sinus congestion and pressure around eyes.  Breathing okay.  Denies chest/abd pain.  Vital Signs: Temp:  [97.4 F (36.3 C)-99.1 F (37.3 C)] 97.4 F (36.3 C) (11/05 0749) Pulse Rate:  [71-86] 82  (11/05 0800) Resp:  [19-30] 21  (11/05 0800) BP: (123-161)/(47-64) 161/60 mmHg (11/05 0800) SpO2:  [94 %-99 %] 94 % (11/05 0800) Arterial Line BP: (139-163)/(51-64) 139/51 mmHg (11/04 1200) FiO2 (%):  [40 %-40.4 %] 40.4 % (11/04 1100) Weight:  [283 lb 15.2 oz (128.8 kg)] 283 lb 15.2 oz (128.8 kg) (11/05 0500)  Physical Examination: General:  No distress Neuro:  Alert, follows commands HEENT:  Rt sclera injected, mild maxillary sinus  tenderness b/l Cardiovascular:  s1s2 regular, distant heart sounds, no murmur Lungs:  Prolonged exhalation, scattered rhonchi, no wheeze Abdomen:  Obese, soft, non-tender, decreased bowel sounds Musculoskeletal:  No edema Skin:  No rashes  DIAGNOSES: Principal Problem:  *Acute-on-chronic respiratory failure Active Problems:  Acute pulmonary edema  Acute on chronic diastolic congestive heart failure  Hypertension  COPD with acute exacerbation  OSA (obstructive sleep apnea)  Hyperkalemia  Acute respiratory acidosis  Breast cancer  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  Acute encephalopathy  ASSESSMENT / PLAN: PULMONARY  Lab 02/20/12 0419 02/19/12 0425 02/19/12 0339  PHART 7.365 7.327* 7.333*  PCO2ART 49.4* 49.3* 47.7*  PO2ART 106.0* 80.0 78.0*  HCO3 27.6* 25.7* 25.3*  O2SAT 95.6 94.0 94.0    Ventilator Settings: Vent Mode:  [-] CPAP FiO2 (%):  [40 %-40.4 %] 40.4 % PEEP:  [4.5 cmH20] 4.5 cmH20  CXR:  11/05>>ATX, mild PVC   A:  Acute on chronic respiratory failure in setting of AECOPD and acute pulmonary edema with hx of OSA.   Chronic sinus disease. P:   Adjust oxygen to keep SpO2 > 92% CPAP qhs Continue scheduled BD's Wean of prednisone as tolerated>>change to 20 mg on 11/06 Resume daily claritin, and use flonase in place on nasonex   CARDIOVASCULAR  Lab 02/19/12 0423 02/18/12 1652  LATICACIDVEN -- 1.2  O2SATVEN -- --  PROBNP 511.4* 393.6*    A: Acute pulmonary edema with acute on chronic diastolic dysfunction likely from acute hypoxia with hx of HTN. P:  Continue current anti-HTN meds Goal negative fluid balance  RENAL  Lab 02/22/12 0430 02/21/12 0500 02/20/12 0400  NA 140 145 143  K 4.2 4.3 5.1  CL 101 108 107  CO2 23 31 28   BUN 28* 37* 35*  CREATININE 0.84 0.97 1.15*  CALCIUM 8.6 8.6 8.2*  MG -- -- --  PHOS -- -- --   Intake/Output      11/04 0701 - 11/05 0700 11/05 0701 - 11/06 0700   P.O. 1040 120   I.V. (mL/kg)  497.2 (3.9) 20 (0.2)   NG/GT 80    IV Piggyback 524    Total Intake(mL/kg) 2141.2 (16.6) 140 (1.1)   Urine (mL/kg/hr) 3030 (1) 475   Total Output 3030 475   Net -888.8 -335          A:  Acute renal insufficiency, Hyperkalemia. Resolved. P:   F/u renal fx, urine outpt, electrolytes D/c foley  GASTROINTESTINAL  Lab 02/21/12 0500 02/20/12 0400 02/18/12 1606  AST 17 18 27   ALT 12 14 20   ALKPHOS 43 47 58  PROT 6.9 6.9 7.8  ALBUMIN 2.6* 2.7* 3.0*    A: Nausea/vomiting 11/03.   Improved 11/05. 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 Anemia of chronic disease. Thrombocytopenia. P:  F/u CBC Resume Lovenox for DVT proph  INFECTIOUS  Lab 02/22/12 0430 02/21/12 0500 02/20/12 0400 02/19/12 0422 02/18/12 1652  PROCALCITON -- -- -- -- --  WBC 10.5 10.8* 11.5* 10.5 --  LATICACIDVEN -- -- -- -- 1.2    A:  AECOPD.  P:   D4/5 avelox  ENDOCRINE CBG (last 3)   Lab 02/21/12 2231 02/21/12 1942 02/21/12 1530 02/21/12 1148 02/21/12 0729  GLUCAP 177* 216* 238* 190* 129*    A:  DM type II with steroid induced hyperglycemia.   P:   SSI  NEUROLOGIC  A:  Acute encephalopathy 2nd to acute respiratory failure with hypoxia. Resolved 11/04. Headache. Likely from chronic sinus disease. P:   Monitor clinically PRN tylenol, oxycodone  Summary: Slowing improving.  Headache likely related to chronic sinus disease>>will resume sinus regimen.  No acute process on CT head, and scleral injection likely related to coughing>>will resume lovenox.  Will d/c foley.  Will consult PT.  Transfer to telemetry.  Keep on PCCM service.  Likely will be ready for d/c home soon.  Coralyn Helling, MD [facility]  Pulmonary/Critical Care 02/22/2012, 8:25 AM Pager:  [phone] After 3pm call: [phone]

## 2012-02-22 NOTE — Progress Notes (Signed)
PHARMACIST - PHYSICIAN COMMUNICATION DR: CCM et al CONCERNING: Antibiotic IV to Oral Route Change Policy  RECOMMENDATION: This patient is receiving avelox by the intravenous route.  Based on criteria approved by the Pharmacy and Therapeutics Committee, the antibiotic(s) is/are being converted to the equivalent oral dose form(s).   DESCRIPTION: These criteria include:  Patient being treated for a respiratory tract infection, urinary tract infection, or cellulitis  The patient is not neutropenic and does not exhibit a GI malabsorption state  The patient is eating (either orally or via tube) and/or has been taking other orally administered medications for a least 24 hours  The patient is improving clinically and has a Tmax < 100.5  If you have questions about this conversion, please contact the Pharmacy Department  []   [phone] )  [facility] [x]   [phone] )  [facility]  []   [phone] )  [facility] []   [phone] )  [facility]  Thanks Herby Abraham, [place].D. [phone] 02/22/2012 2:19 PM

## 2012-02-23 DIAGNOSIS — G4733 Obstructive sleep apnea (adult) (pediatric): Secondary | ICD-10-CM

## 2012-02-23 DIAGNOSIS — E1139 Type 2 diabetes mellitus with other diabetic ophthalmic complication: Secondary | ICD-10-CM

## 2012-02-23 LAB — BASIC METABOLIC PANEL
CO2: 32 mEq/L (ref 19–32)
Calcium: 9.3 mg/dL (ref 8.4–10.5)
GFR calc Af Amer: 88 mL/min — ABNORMAL LOW (ref >90)
GFR calc non Af Amer: 76 mL/min — ABNORMAL LOW (ref >90)
Sodium: 139 mEq/L (ref 135–145)

## 2012-02-23 LAB — CBC
Platelets: 244 10*3/uL (ref 150–400)
RBC: 4.41 MIL/uL (ref 3.87–5.11)
WBC: 9.2 10*3/uL (ref 4.0–10.5)

## 2012-02-23 LAB — GLUCOSE, CAPILLARY: Glucose-Capillary: 199 mg/dL — ABNORMAL HIGH (ref 70–99)

## 2012-02-23 MED ORDER — OLOPATADINE HCL 0.1 % OP SOLN
1.0000 [drp] | Freq: Two times a day (BID) | OPHTHALMIC | Status: DC
  Administered 2012-02-23 – 2012-02-25 (×5): 1 [drp] via OPHTHALMIC
  Filled 2012-02-23: qty 5

## 2012-02-23 MED ORDER — PREDNISONE 20 MG PO TABS
20.0000 mg | ORAL_TABLET | Freq: Every day | ORAL | Status: DC
  Administered 2012-02-24 – 2012-02-25 (×2): 20 mg via ORAL
  Filled 2012-02-23 (×3): qty 1

## 2012-02-23 MED ORDER — POTASSIUM CHLORIDE CRYS ER 20 MEQ PO TBCR
40.0000 meq | EXTENDED_RELEASE_TABLET | Freq: Two times a day (BID) | ORAL | Status: AC
  Administered 2012-02-23 (×2): 40 meq via ORAL
  Filled 2012-02-23 (×2): qty 2

## 2012-02-23 MED FILL — Diazepam Inj 5 MG/ML: INTRAMUSCULAR | Qty: 2 | Status: AC

## 2012-02-23 MED FILL — Fentanyl Citrate Inj 0.05 MG/ML: INTRAMUSCULAR | Qty: 2 | Status: AC

## 2012-02-23 NOTE — Progress Notes (Signed)
.   Name: Melissa Villa MRN: [id number] DOB: 05-01-1945    LOS: 5  REFERRING PRIVIDER: Glynn Octave PRIMARY CARE: Margarito Liner PULMONARY: Jetty Duhamel CARDIOLOGY: Lewayne Bunting ONCOLOGY: Gus Magrinat CHIEF COMPLAINT:  Respiratory failure   Brief patient description:  66 yo female admitted 11/01 after running out of oxygen at [facility] and developing acute on chronic hypoxic, hypercapnic respiratory failure requiring intubation from AECOPD and acute diastolic heart failure.  Significant PMHx Recurrent breast cancer (dx 01/19/12), COPD, OSA, Chronic hypoxia, Morbid Obesity, HTN, AV block s/p PM, DM type II with retinopathy/neuropathy, Carcinoid s/p RLLectomy  Lines/tubes: ETT 11/01>>11/04 Lt Radial aline 11/01>>11/04  Cultures: Sputum 11/01>>>oral flora MRSA screen 11/1>>POSITIVE  Antibiotics: Avelox (AECOPD) 11/01>>  Significant studies procedures and events:  11/05 CT head>>no acute process, likely mild small vessel ischemic microangiopathy and chronic lacunar infarct within the right pons, scattered small lucencies in the calvarium, of uncertain significance.    Level of Care:  ICU Primary Service: PCCM  Consultants:  None Code Status:  Full Diet:  Carb modified DVT Px:  Lovenox GI Px:  Protonix  Interval/ subjective:  No acute change overnight.  PT recommending SNF.  Feels very tired this am.   Vital Signs: Temp:  [97.8 F (36.6 C)-98.4 F (36.9 C)] 97.8 F (36.6 C) (11/06 0631) Pulse Rate:  [77-92] 92  (11/06 0631) Resp:  [20-22] 20  (11/06 0631) BP: (125-150)/(55-78) 132/57 mmHg (11/06 0631) SpO2:  [92 %-96 %] 94 % (11/06 0631) FiO2 (%):  [28 %] 28 % (11/05 1635) Weight:  [263 lb 10.7 oz (119.6 kg)] 263 lb 10.7 oz (119.6 kg) (11/06 0631)  Physical Examination: General:  No distress Neuro:  Drowsy, arousable, MAE, gen weakness  HEENT:  Rt sclera injected, mild maxillary sinus tenderness b/l Cardiovascular:  s1s2 regular, distant heart sounds, no  murmur Lungs:  Prolonged exhalation, few scattered rhonchi, no wheeze Abdomen:  Obese, soft, non-tender, decreased bowel sounds Musculoskeletal:  No edema Skin:  No rashes  DIAGNOSES: Principal Problem:  *Acute-on-chronic respiratory failure Active Problems:  Acute pulmonary edema  Acute on chronic diastolic congestive heart failure  Hypertension  COPD with acute exacerbation  OSA (obstructive sleep apnea)  Hyperkalemia  Acute respiratory acidosis  Breast cancer  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  Acute encephalopathy  ASSESSMENT / PLAN: PULMONARY  Lab 02/20/12 0419 02/19/12 0425 02/19/12 0339  PHART 7.365 7.327* 7.333*  PCO2ART 49.4* 49.3* 47.7*  PO2ART 106.0* 80.0 78.0*  HCO3 27.6* 25.7* 25.3*  O2SAT 95.6 94.0 94.0    Ventilator Settings: Vent Mode:  [-]  FiO2 (%):  [28 %] 28 %  CXR:  11/05>>ATX, mild PVC   A:  Acute on chronic respiratory failure in setting of AECOPD and acute pulmonary edema with hx of OSA.   Chronic sinus disease. P:   Adjust oxygen to keep SpO2 > 92% Cont CPAP qhs Continue scheduled BD's Wean of prednisone as tolerated>>changed to 20 mg on 11/06 Resume daily claritin, and use flonase in place of nasonex   CARDIOVASCULAR  A: Acute pulmonary edema with acute on chronic diastolic dysfunction likely from acute hypoxia with hx of HTN. P:  Continue current anti-HTN meds Goal negative fluid balance  RENAL  Lab 02/23/12 0520 02/22/12 0430 02/21/12 0500  NA 139 140 145  K 3.1* 4.2 4.3  CL 97 101 108  CO2 32 23 31  BUN 21 28* 37*  CREATININE 0.80 0.84 0.97  CALCIUM 9.3 8.6 8.6  MG -- -- --  PHOS -- -- --   Intake/Output      11/05 0701 - 11/06 0700 11/06 0701 - 11/07 0700   P.O. 720    I.V. (mL/kg) 40 (0.3)    NG/GT     IV Piggyback     Total Intake(mL/kg) 760 (6.4)    Urine (mL/kg/hr) 1215 (0.4)    Total Output 1215    Net -455         Urine Occurrence 5 x      A:  Acute renal insufficiency,  Hyperkalemia- Resolved. Hypokalemia  P:   F/u renal fx, urine outpt, electrolytes Replete K 11/6  GASTROINTESTINAL  Lab 02/21/12 0500 02/20/12 0400 02/18/12 1606  AST 17 18 27   ALT 12 14 20   ALKPHOS 43 47 58  PROT 6.9 6.9 7.8  ALBUMIN 2.6* 2.7* 3.0*    A: Nausea/vomiting 11/03 - resolved.  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 Anemia of chronic disease. Thrombocytopenia. P:  F/u CBC Cont Lovenox for DVT proph  INFECTIOUS  Lab 02/23/12 0520 02/22/12 0430 02/21/12 0500 02/20/12 0400 02/18/12 1652  PROCALCITON -- -- -- -- --  WBC 9.2 10.5 10.8* 11.5* --  LATICACIDVEN -- -- -- -- 1.2    A:  AECOPD.  P:   D5/5 avelox   ENDOCRINE CBG (last 3)   Lab 02/22/12 2142 02/22/12 1632 02/22/12 1139 02/22/12 0746 02/21/12 2231  GLUCAP 222* 152* 208* 150* 177*    A:  DM type II with steroid induced hyperglycemia.   P:   SSI Should improve with decreased steroids   NEUROLOGIC  A:  Acute encephalopathy 2nd to acute respiratory failure with hypoxia. - Resolved 11/04. Headache - Likely from chronic sinus disease. P:   Monitor clinically PRN tylenol, oxycodone  Summary: Slowing improving.  Final day of abx, tapering steroids.  Pulm edema resolved.  Cont lasix.  Add K supp for hypokalemia.  Seen 11/5 by PT with recommendations for SNF.  Will consult social work for [employer]. Keep on PCCM service.  Likely will be ready for d/c soon.    Danford Bad, NP 02/23/2012  8:34 AM Pager: ([phone]) [phone] or [phone]  *Care during the described time interval was provided by me and/or other providers on the critical care team. I have reviewed this patient's available data, including medical history,  events of note, physical examination and test results as part of my evaluation.  Dorcas Carrow Beeper  [phone]  Cell  [phone]  If no response or cell goes to voicemail, call beeper [phone]

## 2012-02-23 NOTE — Progress Notes (Signed)
Inpatient Diabetes Program Recommendations  AACE/ADA: New Consensus Statement on Inpatient Glycemic Control (2013)  Target Ranges:  Prepandial:   less than 140 mg/dL      Peak postprandial:   less than 180 mg/dL (1-2 hours)      Critically ill patients:  140 - 180 mg/dL    Inpatient Diabetes Program Recommendations Insulin - Basal: Increase Lantus to 10 BID  Thank you  Piedad Climes [facility] Inpatient Diabetes Coordinator [phone]

## 2012-02-23 NOTE — Progress Notes (Signed)
Pt placed on cpap 13 cmH2O with 2L O2 bleed in. Pt brought her mask in, so hooked up to our machine & tolerating well.  Jacqulynn Cadet RRT

## 2012-02-24 ENCOUNTER — Telehealth: Payer: Self-pay | Admitting: *Deleted

## 2012-02-24 LAB — GLUCOSE, CAPILLARY
Glucose-Capillary: 259 mg/dL — ABNORMAL HIGH (ref 70–99)
Glucose-Capillary: 207 mg/dL — ABNORMAL HIGH (ref 70–99)

## 2012-02-24 LAB — BASIC METABOLIC PANEL
BUN: 22 mg/dL (ref 6–23)
CO2: 31 mEq/L (ref 19–32)
Chloride: 99 mEq/L (ref 96–112)
GFR calc Af Amer: 81 mL/min — ABNORMAL LOW (ref >90)
Potassium: 3.6 mEq/L (ref 3.5–5.1)

## 2012-02-24 NOTE — Care Management Note (Signed)
    Page 1 of 1   02/25/2012     3:05:23 PM   CARE MANAGEMENT NOTE 02/25/2012  Patient:  Melissa Villa, Melissa Villa   Account Number:  [id number]  Date Initiated:  02/24/2012  Documentation initiated by:  Marria Mathison  Subjective/Objective Assessment:   PT ADM ON 02/18/12 WITH RESPIRATORY FAILURE AFTER RUNNING OUT OF OXYGEN WHILE SHOPPING.  PTA, PT INDEPENDENT, LIVES ALONE.     Action/Plan:   PT QUITE DECONDITIONED--PT/OT RECOMMENDING SNF AT DISCHARGE.  CSW FOLLOWING TO FACILITATE DC TO SNF WHEN MEDICALLY STABLE.   Anticipated DC Date:  02/25/2012   Anticipated DC Plan:  SKILLED NURSING FACILITY  In-house referral  Clinical Social Worker      DC Planning Services  CM consult      Choice offered to / List presented to:             Status of service:  Completed, signed off Medicare Important Message given?   (If response is "NO", the following Medicare IM given date fields will be blank) Date Medicare IM given:   Date Additional Medicare IM given:    Discharge Disposition:  SKILLED NURSING FACILITY  Per UR Regulation:  Reviewed for med. necessity/level of care/duration of stay  If discussed at Long Length of Stay Meetings, dates discussed:   02/24/2012    Comments:  02/25/12 Ashwin Tibbs,RN,BSN [phone] PT FOR DISCHARGE TODAY TO [facility], PER CSW ARRANGEMENTS.

## 2012-02-24 NOTE — Progress Notes (Signed)
.   Name: Melissa Villa MRN: [id number] DOB: 1945-07-06    LOS: 6  REFERRING PROVIDER: Glynn Octave PRIMARY CARE: Margarito Liner PULMONARY: Jetty Duhamel CARDIOLOGY: Lewayne Bunting ONCOLOGY: Gus Magrinat CHIEF COMPLAINT:  Respiratory failure   Brief patient description:  66 yo female admitted 11/01 after running out of oxygen at [facility] and developing acute on chronic hypoxic, hypercapnic respiratory failure requiring intubation from AECOPD and acute diastolic heart failure.  Significant PMHx Recurrent breast cancer (dx 01/19/12), COPD, OSA, Chronic hypoxia, Morbid Obesity, HTN, AV block s/p PM, DM type II with retinopathy/neuropathy, Carcinoid s/p RLLectomy  Lines/tubes: ETT 11/01>>11/04 Lt Radial aline 11/01>>11/04  Cultures: Sputum 11/01>>>oral flora MRSA screen 11/1>>POSITIVE  Antibiotics: Avelox (AECOPD) 11/01>>  Significant studies procedures and events:  11/05 CT head>>no acute process, likely mild small vessel ischemic microangiopathy and chronic lacunar infarct within the right pons, scattered small lucencies in the calvarium, of uncertain significance.    Level of Care:  Tele Primary Service: PCCM  Consultants:  None Code Status:  Full Diet:  Carb modified DVT Px:  Lovenox GI Px:  Protonix  Interval/ subjective:  No acute change overnight.  PT recommending SNF.  Feels very tired this am. CO headache.  Vital Signs: Temp:  [98.1 F (36.7 C)-98.4 F (36.9 C)] 98.1 F (36.7 C) (11/07 0459) Pulse Rate:  [78-85] 85  (11/07 0459) Resp:  [20] 20  (11/07 0459) BP: (129-148)/(18-75) 148/18 mmHg (11/07 0459) SpO2:  [92 %-98 %] 92 % (11/07 0729) Weight:  [122.1 kg (269 lb 2.9 oz)] 122.1 kg (269 lb 2.9 oz) (11/07 0502)  Physical Examination: General:  No distress Neuro:  Awake sitting up, MAE, gen weakness  HEENT:  Rt sclera reddened,  Cardiovascular:  s1s2 regular, distant heart sounds, no murmur Lungs:  Prolonged exhalation, few scattered rhonchi, no  wheeze, decreased bs  Abdomen:  Obese, soft, non-tender, decreased bowel sounds Musculoskeletal:  No edema Skin:  No rashes  DIAGNOSES: Principal Problem:  *Acute-on-chronic respiratory failure Active Problems:  Acute pulmonary edema  Acute on chronic diastolic congestive heart failure  Hypertension  COPD with acute exacerbation  OSA (obstructive sleep apnea)  Hyperkalemia  Acute respiratory acidosis  Breast cancer  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   Chronic sinus disease. P:   Adjust oxygen to keep SpO2 > 92% Cont CPAP qhs Continue scheduled BD's Wean of prednisone as tolerated>>changed to 20 mg on 11/06 Resume daily claritin, and use flonase in place of nasonex   CARDIOVASCULAR  A: Acute pulmonary edema with acute on chronic diastolic dysfunction likely from acute hypoxia with hx of HTN. P:  Continue current anti-HTN meds Goal negative fluid balance  Intake/Output Summary (Last 24 hours) at 02/24/12 0935 Last data filed at 02/24/12 0647  Gross per 24 hour  Intake    240 ml  Output    650 ml  Net   -410 ml    RENAL  Lab 02/24/12 0610 02/23/12 0520 02/22/12 0430  NA 141 139 140  K 3.6 3.1* 4.2  CL 99 97 101  CO2 31 32 23  BUN 22 21 28*  CREATININE 0.86 0.80 0.84  CALCIUM 9.5 9.3 8.6  MG -- -- --  PHOS -- -- --   Intake/Output      11/06 0701 - 11/07 0700 11/07 0701 - 11/08 0700   P.O. 720    I.V. (mL/kg)     Total Intake(mL/kg) 720 (5.9)    Urine (mL/kg/hr) 850 (0.3)    Total Output 850    Net -130           A:  Acute renal  insufficiency, Hyperkalemia- Resolved. Hypokalemia  P:   F/u renal fx, urine outpt, electrolytes   GASTROINTESTINAL  Lab 02/21/12 0500 02/20/12 0400 02/18/12 1606  AST 17 18 27   ALT 12 14 20   ALKPHOS 43 47 58  PROT 6.9 6.9 7.8  ALBUMIN 2.6* 2.7* 3.0*    A: Nausea/vomiting 11/03 - resolved.  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 Anemia of chronic disease. Thrombocytopenia. P:  F/u CBC Cont Lovenox for DVT proph  INFECTIOUS  Lab 02/23/12 0520 02/22/12 0430 02/21/12 0500 02/20/12 0400 02/18/12 1652  PROCALCITON -- -- -- -- --  WBC 9.2 10.5 10.8* 11.5* --  LATICACIDVEN -- -- -- -- 1.2    A:  AECOPD.  P:   D5/5 avelox dc 11-7  ENDOCRINE CBG (last 3)   Lab 02/24/12 0639 02/23/12 2144 02/23/12 1633 02/23/12 1141 02/23/12 0629  GLUCAP 192* 129* 229* 259* 199*    A:  DM type II with steroid induced hyperglycemia.   P:   SSI Should improve with decreased steroids   NEUROLOGIC  A:  Acute encephalopathy 2nd to acute respiratory failure with hypoxia. - Resolved 11/04. Headache - Likely from chronic sinus disease. P:   Monitor clinically PRN tylenol, oxycodone  Summary: Slowing improving.  Final day of abx, tapering steroids.  Pulm edema resolved.  Cont lasix.  Add K supp for hypokalemia.  Seen 11/5 by PT with recommendations for SNF.  Will consult social work for [employer]. Keep on PCCM service.  Likely will be ready for d/c soon.  11/7 await insurance approval for SNF.  Brett Canales Minor ACNP [facility] PCCM Pager [phone] till 3 pm If no answer page [phone] 02/24/2012, 9:34 AM  *Care during the described time interval was provided by me and/or other providers on the critical care team. I have  reviewed this patient's available data, including medical history, events of note, physical examination and test results as part of my evaluation.  Awaiting placement, insurance to approve SNF placement, continue O2, stop abx (course complete 11/7), will quickly taper steroids starting on 11/8 over 3-5 days.  Patient seen and examined, agree with above note.  I dictated the care and orders written for this patient under my direction.  Koren Bound, M.D. [phone]

## 2012-02-24 NOTE — Progress Notes (Signed)
Physical Therapy Treatment Patient Details Name: Melissa Villa MRN: [id number] DOB: 1945/11/21 Today's Date: 02/24/2012 Time: 4098-1191 PT Time Calculation (min): 30 min  PT Assessment / Plan / Recommendation Comments on Treatment Session  Pt. s/p Acute on chronic respiratory failure with increased fatigue and weakness.  Pt walked 89feet with RW and was able to maneuver the RW well however fatigued quickly and was having difficulty with urinating upon exertion/coughing.  Attempted to wear mesh pants with minimal success matching body habitus, but patient demonstrated good balance and mobility while assisting putting pants on and off. Recommend continued skilled therapy services prior to returning home.     Follow Up Recommendations  SNF     Does the patient have the potential to tolerate intense rehabilitation     Barriers to Discharge        Equipment Recommendations       Recommendations for Other Services    Frequency Min 3X/week   Plan Discharge plan remains appropriate;Frequency remains appropriate    Precautions / Restrictions Precautions Precautions: Fall   Pertinent Vitals/Pain VSS, some pain     Mobility  Bed Mobility Bed Mobility: Sitting - Scoot to Edge of Bed;Sit to Sidelying Right;Scooting to [facility] Sitting - Scoot to Edge of Bed: 6: Modified independent (Device/Increase time) Sit to Sidelying Right: 4: Min guard Scooting to HOB: 4: Min guard Transfers Transfers: Sit to Stand;Stand to Sit Sit to Stand: With armrests;From bed;From chair/3-in-1;5: Supervision Stand to Sit: With armrests;To chair/3-in-1;To bed;5: Supervision Details for Transfer Assistance: sit/stand well with minimal difficulty and denies pain with transfer; good hand placement  Ambulation/Gait Ambulation/Gait Assistance: 4: Min assist Ambulation Distance (Feet): 50 Feet Assistive device: Rolling walker Ambulation/Gait Assistance Details: Cues to take deep breaths in through nose and out through  mouth.  Pt negotiated RW well. Gait Pattern: Step-to pattern;Shuffle;Trunk flexed Gait velocity: slowed  General Gait Details: slowed, unsure of self, and difficulty catching breath; however O2 sats remained >90% Stairs: No Wheelchair Mobility Wheelchair Mobility: No    :     PT Goals Acute Rehab PT Goals PT Goal: Sit to Stand - Progress: Progressing toward goal PT Goal: Stand to Sit - Progress: Progressing toward goal PT Transfer Goal: Bed to Chair/Chair to Bed - Progress: Progressing toward goal PT Goal: Ambulate - Progress: Progressing toward goal PT Goal: Up/Down Stairs - Progress: Not met PT Goal: Perform Home Exercise Program - Progress: Not met  Visit Information  Last PT Received On: 02/24/12 Assistance Needed: +1    Subjective Data  Subjective: "My head hurts [name]'m real tired today."  Patient Stated Goal: not stated   Cognition  Overall Cognitive Status: Appears within functional limits for tasks assessed/performed Arousal/Alertness: Awake/alert Orientation Level: Appears intact for tasks assessed Behavior During Session: [facility] for tasks performed    Balance  Dynamic Standing Balance Dynamic Standing - Balance Support: Right upper extremity supported Dynamic Standing - Level of Assistance: 4: Min assist Dynamic Standing - Comments: required min assist in don/doff mesh panties prior to walking.  High Level Balance High Level Balance Comments: Pt able to stand while nurse assisted with toileting and changing gowns.  Required 1 hand held assist to maintain balance after 15 seconds.   End of Session PT - End of Session Equipment Utilized During Treatment: Gait belt;Oxygen Activity Tolerance: Patient limited by fatigue;Patient limited by pain Patient left: in bed;with call bell/phone within reach;with family/visitor present Nurse Communication: Mobility status     Sharion Balloon 02/24/2012, 1:54 PM  Sharion Balloon, SPT Acute Rehab Services [phone]  [facility] Acute Rehabilitation [phone] (pager)

## 2012-02-24 NOTE — Clinical Social Work Note (Signed)
Clinical Social Work Department BRIEF PSYCHOSOCIAL ASSESSMENT 02/24/2012  Patient:  Melissa Villa, Melissa Villa     Account Number:  [id number]     Admit date:  02/18/2012  Clinical Social Worker:  Thomasene Mohair  Date/Time:  02/23/2012 02:00 PM  Referred by:  Physician  Date Referred:  02/23/2012 Referred for  SNF Placement   Other Referral:   Interview type:  Patient Other interview type:    PSYCHOSOCIAL DATA Living Status:  ALONE Admitted from facility:   Level of care:   Primary support name:  Thayer Ohm Primary support relationship to patient:  CHILD, ADULT Degree of support available:   adequate    CURRENT CONCERNS Current Concerns  Post-Acute Placement   Other Concerns:   Pt has additional medical record where all of her health history is found up until this admission. It is marked for merge at some point. CSW access record for accurate insurance information.   SOCIAL WORK ASSESSMENT / PLAN CSW was referred to Pt to assist with dc planning. Pt lives alone, is widowed and has 3 adult children.  Pt has suffered from chronic illness for many years and is on disability. Pt is motivated to recover and live independently again.  CSW discussed SNF option with Pt and she is agreeable. CSW began SNF bed search and will update Pt with options.   Assessment/plan status:  Psychosocial Support/Ongoing Assessment of Needs Other assessment/ plan:   F/U with Oncology for tx plan of breast ca   Information/referral to community resources:    PATIENT'S/FAMILY'S RESPONSE TO PLAN OF CARE: Pt is agreeable to SNF. Pt is motivated to recovery and live independently again.    Frederico Hamman, LCSW  [phone]

## 2012-02-24 NOTE — Clinical Social Work Note (Signed)
CSW gave Pt SNF bed offers and asked her to make selection in anticipation for dc on Friday if medically cleared.  CSW also called and left msg with Pt's Oncologist's nurse re: upcoming dates for tx.  Pt is unsure what the next date is but states that she just finished a 14day course of shots.    CSW will f/u with Pt for SNF selection in the morning. FL2 on chart for MD signature.    Frederico Hamman, LCSW [phone]

## 2012-02-24 NOTE — Telephone Encounter (Signed)
Message left by Gayland Curry at [facility] critical care stating pt has been admitted for respiratory failure. She is currently in room 2001 but will be discharged to skilled nursing facility. Delice Bison is calling to obtain appointment calender for pt for arrangement of transportation. Return call number given as [phone].  This RN returned call to Delice Bison and informed her of scheduled appts for 11/8 for injection and 11/22 lab/md with an injection.  Note pt is due for her second dose of faslodex 11/8 but is an inpt.  Will follow up with covering MD.

## 2012-02-25 ENCOUNTER — Other Ambulatory Visit: Payer: Self-pay | Admitting: *Deleted

## 2012-02-25 ENCOUNTER — Ambulatory Visit: Payer: Medicare Other

## 2012-02-25 LAB — GLUCOSE, CAPILLARY: Glucose-Capillary: 329 mg/dL — ABNORMAL HIGH (ref 70–99)

## 2012-02-25 MED ORDER — OXYCODONE HCL 5 MG PO TABS: 5.0000 mg | ORAL_TABLET | Freq: Four times a day (QID) | ORAL | Status: DC | PRN

## 2012-02-25 MED ORDER — INSULIN ASPART 100 UNIT/ML ~~LOC~~ SOLN: 3.0000 [IU] | Freq: Three times a day (TID) | SUBCUTANEOUS | Status: DC

## 2012-02-25 MED ORDER — IRBESARTAN 300 MG PO TABS: 300.0000 mg | ORAL_TABLET | Freq: Every day | ORAL | Status: DC

## 2012-02-25 MED ORDER — PREDNISONE 20 MG PO TABS: 20.0000 mg | ORAL_TABLET | Freq: Every day | ORAL | Status: DC

## 2012-02-25 MED ORDER — INSULIN ASPART 100 UNIT/ML ~~LOC~~ SOLN: 0.0000 [IU] | Freq: Three times a day (TID) | SUBCUTANEOUS | Status: DC

## 2012-02-25 MED ORDER — FULVESTRANT 250 MG/5ML IM SOLN
500.0000 mg | Freq: Once | INTRAMUSCULAR | Status: AC
  Administered 2012-02-25: 500 mg via INTRAMUSCULAR
  Filled 2012-02-25: qty 10

## 2012-02-25 MED ORDER — INSULIN GLARGINE 100 UNIT/ML ~~LOC~~ SOLN: 10.0000 [IU] | Freq: Every day | SUBCUTANEOUS | Status: DC

## 2012-02-25 MED ORDER — INSULIN ASPART 100 UNIT/ML ~~LOC~~ SOLN: 0.0000 [IU] | Freq: Every day | SUBCUTANEOUS | Status: DC

## 2012-02-25 NOTE — Progress Notes (Signed)
Inpatient Diabetes Program Recommendations  AACE/ADA: New Consensus Statement on Inpatient Glycemic Control (2013)  Target Ranges:  Prepandial:   less than 140 mg/dL      Peak postprandial:   less than 180 mg/dL (1-2 hours)      Critically ill patients:  140 - 180 mg/dL   Results for KAYELIN, WHITHAM (MRN [id number]) as of 02/25/2012 14:45  Ref. Range 02/24/2012 11:14 02/24/2012 16:03 02/24/2012 21:01 02/25/2012 06:37 02/25/2012 11:40  Glucose-Capillary Latest Range: 70-99 mg/dL 409 (H) 811 (H) 914 (H) 242 (H) 329 (H)    Inpatient Diabetes Program Recommendations Insulin - Basal: Increase Lantus to 10 BID  Thank you  Piedad Climes [facility] Inpatient Diabetes Coordinator [phone]

## 2012-02-25 NOTE — Discharge Summary (Signed)
Physician Discharge Summary  Patient ID: Melissa Villa MRN: [id number] DOB/AGE: November 16, 1945 66 y.o.  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  Significant PMHx Recurrent breast cancer (dx 01/19/12), COPD, OSA, Chronic hypoxia, Morbid Obesity, HTN, AV block s/p PM, DM type II with retinopathy/neuropathy, Carcinoid s/p RLLectomy  Hospital Course:  ASSESSMENT / PLAN:  PULMONARY   Lab  02/20/12 0419  02/19/12 0425  02/19/12 0339   PHART  7.365  7.327*  7.333*   PCO2ART  49.4*  49.3*  47.7*   PO2ART  106.0*  80.0  78.0*   HCO3  27.6*  25.7*  25.3*   O2SAT  95.6  94.0  94.0    Ventilator Settings:   CXR: 11/05>>ATX, mild PVC  A: Acute on chronic respiratory failure in setting of AECOPD and acute pulmonary edema with hx of OSA.  Chronic sinus disease.  P:  Adjust oxygen to keep SpO2 > 92%  Cont CPAP qhs  Continue scheduled BD's  Wean of prednisone as tolerated>>changed to 20 mg on 11/06  Resume daily claritin, and use flonase in place of nasonex  CARDIOVASCULAR  A: Acute pulmonary edema with acute on chronic diastolic dysfunction likely from acute hypoxia with hx of HTN.  P:  Continue current anti-HTN meds  Goal negative fluid balance   Intake/Output Summary (Last 24 hours) at 02/25/12 1040 Last data filed at 02/24/12 2300   Gross per 24 hour   Intake  480 ml   Output  900 ml     Net  -420 ml    RENAL   Lab  02/24/12 0610  02/23/12 0520  02/22/12 0430   NA  141  139  140   K  3.6  3.1*  4.2   CL  99  97  101   CO2  31  32  23   BUN  22  21  28*   CREATININE  0.86  0.80  0.84   CALCIUM  9.5  9.3  8.6   MG  --  --  --   PHOS  --  --  --    Intake/Output  11/07 0701 - 11/08 0700 11/08 0701 - 11/09 0700  P.O. 480  Total Intake(mL/kg) 480 (4)  Urine (mL/kg/hr) 900 (0.3)  Total Output 900  Net -420   A: Acute renal insufficiency, Hyperkalemia- Resolved.  Hypokalemia  P:  F/u renal fx, urine outpt, electrolytes  GASTROINTESTINAL   Lab  02/21/12 0500  02/20/12 0400  02/18/12 1606   AST  17  18  27    ALT  12  14  20    ALKPHOS  43  47  58   PROT  6.9  6.9  7.8   ALBUMIN  2.6*  2.7*  3.0*    A: Nausea/vomiting 11/03 - resolved.  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  Anemia of chronic disease.  Thrombocytopenia.  P:  F/u CBC  Cont Lovenox for DVT proph  On fasiodex need oncology to order,follow up nov 22 at cancer center.  Called cancer center 11-8  INFECTIOUS   Lab  02/23/12 0520  02/22/12 0430  02/21/12 0500  02/20/12 0400  02/18/12 1652   PROCALCITON  --  --  --  --  --   WBC  9.2  10.5  10.8*  11.5*  --   LATICACIDVEN  --  --  --  --  1.2    A: AECOPD.  P:  D5/5 avelox dc 11-7  ENDOCRINE  CBG (last 3)  Wean steroids over six days post transfer to NH.  Lab  02/25/12 0637  02/24/12 2101  02/24/12 1603  02/24/12 1114  02/24/12 0639   GLUCAP  242*  207*  207*  259*  192*    A: DM type II with steroid induced hyperglycemia.  P:  SSI  Should improve with decreased steroids  NEUROLOGIC  A: Acute encephalopathy 2nd to acute respiratory  failure with hypoxia. - Resolved 11/04.  Headache - Likely from chronic sinus disease.  P:  Monitor clinically  PRN tylenol, oxycodone  Summary:  Slowing improving. Final day of abx, tapering steroids. Pulm edema resolved. Cont lasix. Add K supp for hypokalemia. Seen 11/5 by PT with recommendations for SNF. Will consult social work for [employer]. Keep on PCCM service. Likely will be ready for d/c soon.  11/8 await insurance approval for SNF. Approved 11-8 will go to [facility].       Labs at discharge Lab Results  Component Value Date   CREATININE 0.86 02/24/2012   BUN 22 02/24/2012   NA 141 02/24/2012   K 3.6 02/24/2012   CL 99 02/24/2012   CO2 31 02/24/2012   Lab Results  Component Value Date   WBC 9.2 02/23/2012   HGB 9.5* 02/23/2012   HCT 32.6* 02/23/2012   MCV 73.9* 02/23/2012   PLT 244 02/23/2012   Lab Results  Component Value Date   ALT 12 02/21/2012   AST 17 02/21/2012   ALKPHOS 43 02/21/2012   BILITOT 0.2* 02/21/2012   Lab Results  Component Value Date   INR 1.05 02/18/2012    Current radiology studies No results found.  Disposition:  Final discharge disposition not confirmed  Discharge Orders    Future Orders Please Complete By Expires   Discharge to SNF when bed available          Medication List     As of 02/25/2012  1:51 PM    STOP taking these medications         HYDROcodone-acetaminophen 10-500 MG per tablet   Commonly known as: LORTAB      HYDROcodone-acetaminophen 5-325 MG per tablet   Commonly known as: NORCO/VICODIN      metFORMIN 850 MG tablet   Commonly known as: GLUCOPHAGE      mometasone 50 MCG/ACT nasal spray   Commonly known as: NASONEX      pregabalin 200 MG capsule   Commonly known as: LYRICA      rosuvastatin  5 MG tablet   Commonly known as: CRESTOR      sitaGLIPtin 100 MG tablet   Commonly known as: JANUVIA      valsartan 80 MG tablet   Commonly known as: DIOVAN      TAKE these medications         albuterol 108  (90 BASE) MCG/ACT inhaler   Commonly known as: PROVENTIL HFA;VENTOLIN HFA   Inhale 2 puffs into the lungs 4 (four) times daily as needed. For shortness of breath or wheezing      amLODipine 10 MG tablet   Commonly known as: NORVASC   Take 10 mg by mouth daily.      BEPREVE 1.5 % Soln   Generic drug: Bepotastine Besilate   Place 1 drop into both eyes 2 (two) times daily.      carvedilol 12.5 MG tablet   Commonly known as: COREG   Take 25 mg by mouth 2 (two) times daily with a meal.      cetirizine 10 MG tablet   Commonly known as: ZYRTEC   Take 10 mg by mouth daily. For seasonal allergies      DEXILANT 60 MG capsule   Generic drug: dexlansoprazole   Take 60 mg by mouth every morning. 15-20 minutes before breakfast      dipyridamole-aspirin 200-25 MG per 12 hr capsule   Commonly known as: AGGRENOX   Take 1 capsule by mouth 2 (two) times daily.      Fluticasone-Salmeterol 250-50 MCG/DOSE Aepb   Commonly known as: ADVAIR   Inhale 1 puff into the lungs every 12 (twelve) hours.      insulin aspart 100 UNIT/ML injection   Commonly known as: novoLOG   Inject 0-20 Units into the skin 3 (three) times daily with meals.      insulin aspart 100 UNIT/ML injection   Commonly known as: novoLOG   Inject 0-5 Units into the skin at bedtime.      insulin aspart 100 UNIT/ML injection   Commonly known as: novoLOG   Inject 3 Units into the skin 3 (three) times daily with meals.      insulin glargine 100 UNIT/ML injection   Commonly known as: LANTUS   Inject 10 Units into the skin at bedtime.      irbesartan 300 MG tablet   Commonly known as: AVAPRO   Take 1 tablet (300 mg total) by mouth daily.      oxyCODONE 5 MG immediate release tablet   Commonly known as: Oxy IR/ROXICODONE   Take 1-2 tablets (5-10 mg total) by mouth every 6 (six) hours as needed.      predniSONE 20 MG tablet   Commonly known as: DELTASONE   Take 1 tablet (20 mg total) by mouth daily with breakfast.       tiotropium 18 MCG inhalation capsule   Commonly known as: SPIRIVA   Place 18 mcg into inhaler and inhale daily.      traMADol 50 MG tablet   Commonly known as: ULTRAM   Take 50 mg by mouth every 6 (six) hours as needed. For pain           Follow-up Information    Follow up with [name], MD. On 03/10/2012. (900 am)    Contact information:   [address] [place] [address] [phone]          Discharged Condition: {good Vital signs at Discharge. Temp:  [97.5 F (36.4 C)-98.6 F (37 C)]  97.5 F (36.4 C) (11/08 1322) Pulse Rate:  [80-86] 86  (11/08 1322) Resp:  [18-19] 18  (11/08 1322) BP: (113-142)/(66-77) 113/67 mmHg (11/08 1322) SpO2:  [90 %-97 %] 92 % (11/08 1322) Weight:  [118.7 kg (261 lb 11 oz)] 118.7 kg (261 lb 11 oz) (11/08 0310) Office follow up Special Information or instructions. Follow up with oncology as scheduled. Follow up with Dr. Fannie Knee as needed Signed: Brett Canales Minor ACNP [facility] PCCM Pager [phone] till 3 pm If no answer page [phone] 02/25/2012, 1:51 PM  Patient to be D/Ced to SNF, f/u as above.  Patient seen and examined, agree with above, I have dictated the care above.  Alyson Reedy, M.D. [facility] Pulmonary/Critical Care Medicine [phone]

## 2012-02-25 NOTE — Progress Notes (Deleted)
.   Name: Melissa Villa MRN: [id number] DOB: 1945/07/30    LOS: 7  REFERRING PROVIDER: Glynn Octave PRIMARY CARE: Margarito Liner PULMONARY: Jetty Duhamel CARDIOLOGY: Lewayne Bunting ONCOLOGY: Gus Magrinat CHIEF COMPLAINT:  Respiratory failure   Brief patient description:  66 yo female admitted 11/01 after running out of oxygen at [facility] and developing acute on chronic hypoxic, hypercapnic respiratory failure requiring intubation from AECOPD and acute diastolic heart failure.  Significant PMHx Recurrent breast cancer (dx 01/19/12), COPD, OSA, Chronic hypoxia, Morbid Obesity, HTN, AV block s/p PM, DM type II with retinopathy/neuropathy, Carcinoid s/p RLLectomy  Lines/tubes: ETT 11/01>>11/04 Lt Radial aline 11/01>>11/04  Cultures: Sputum 11/01>>>oral flora MRSA screen 11/1>>POSITIVE  Antibiotics: Avelox (AECOPD) 11/01>>  Significant studies procedures and events:  11/05 CT head>>no acute process, likely mild small vessel ischemic microangiopathy and chronic lacunar infarct within the right pons, scattered small lucencies in the calvarium, of uncertain significance.    Level of Care:  Tele Primary Service: PCCM  Consultants:  None Code Status:  Full Diet:  Carb modified DVT Px:  Lovenox GI Px:  Protonix  Interval/ subjective:  No acute change overnight.  PT recommending SNF. NSC 11-8  Vital Signs: Temp:  [98.1 F (36.7 C)-98.6 F (37 C)] 98.6 F (37 C) (11/08 0310) Pulse Rate:  [80-85] 85  (11/08 0310) Resp:  [18-19] 19  (11/08 0310) BP: (124-142)/(66-77) 134/77 mmHg (11/08 0310) SpO2:  [90 %-97 %] 97 % (11/08 0721) Weight:  [118.7 kg (261 lb 11 oz)] 118.7 kg (261 lb 11 oz) (11/08 0310)  Physical Examination: General:  No distress 11/8 nsc Neuro:  Awake sitting up, MAE, gen weakness  HEENT:  Rt sclera reddened,  Cardiovascular:  s1s2 regular, distant heart sounds, no murmur Lungs:  Prolonged exhalation, few scattered rhonchi, no wheeze, decreased bs  Abdomen:   Obese, soft, non-tender, decreased bowel sounds Musculoskeletal:  No edema Skin:  No rashes  DIAGNOSES: Principal Problem:  *Acute-on-chronic respiratory failure Active Problems:  Acute pulmonary edema  Acute on chronic diastolic congestive heart failure  Hypertension  COPD with acute exacerbation  OSA (obstructive sleep apnea)  Hyperkalemia  Acute respiratory acidosis  Breast cancer  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   Chronic sinus disease. P:   Adjust oxygen to keep SpO2 > 92% Cont CPAP qhs Continue scheduled BD's Wean of prednisone as tolerated>>changed to 20 mg on 11/06 Resume daily claritin, and use flonase in place of nasonex   CARDIOVASCULAR  A: Acute pulmonary edema with acute on chronic diastolic dysfunction likely from acute hypoxia with hx of HTN. P:  Continue current anti-HTN meds Goal negative fluid balance  Intake/Output Summary (Last 24 hours) at 02/25/12 1040 Last data filed at 02/24/12 2300  Gross per 24 hour  Intake    480 ml  Output    900 ml  Net   -420 ml    RENAL  Lab 02/24/12 0610 02/23/12 0520 02/22/12 0430  NA 141 139 140  K 3.6 3.1* 4.2  CL 99 97 101  CO2 31 32 23  BUN 22 21 28*  CREATININE 0.86 0.80 0.84  CALCIUM 9.5 9.3 8.6  MG -- -- --  PHOS -- -- --   Intake/Output      11/07 0701 - 11/08 0700 11/08 0701 - 11/09 0700   P.O. 480    Total Intake(mL/kg) 480 (4)    Urine (mL/kg/hr) 900 (0.3)    Total Output 900    Net -420           A:  Acute renal insufficiency, Hyperkalemia- Resolved. Hypokalemia  P:     F/u renal fx, urine outpt, electrolytes   GASTROINTESTINAL  Lab 02/21/12 0500 02/20/12 0400 02/18/12 1606  AST 17 18 27   ALT 12 14 20   ALKPHOS 43 47 58  PROT 6.9 6.9 7.8  ALBUMIN 2.6* 2.7* 3.0*    A: Nausea/vomiting 11/03 - resolved.  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 Anemia of chronic disease. Thrombocytopenia. P:  F/u CBC Cont Lovenox for DVT proph On fasiodex need oncology to order,follow up nov 22 at cancer center. Called cancer center 11-8  INFECTIOUS  Lab 02/23/12 0520 02/22/12 0430 02/21/12 0500 02/20/12 0400 02/18/12 1652  PROCALCITON -- -- -- -- --  WBC 9.2 10.5 10.8* 11.5* --  LATICACIDVEN -- -- -- -- 1.2    A:  AECOPD.  P:   D5/5 avelox dc 11-7  ENDOCRINE CBG (last 3)   Lab 02/25/12 0637 02/24/12 2101 02/24/12 1603 02/24/12 1114 02/24/12 0639  GLUCAP 242* 207* 207* 259* 192*    A:  DM type II with steroid induced hyperglycemia.   P:   SSI Should improve with decreased steroids   NEUROLOGIC  A:  Acute encephalopathy 2nd to acute respiratory failure with hypoxia. - Resolved 11/04. Headache - Likely from chronic sinus disease. P:   Monitor clinically PRN tylenol, oxycodone  Summary: Slowing improving.  Final day of abx, tapering steroids.  Pulm edema resolved.  Cont lasix.  Add K supp for hypokalemia.  Seen 11/5 by PT with recommendations for SNF.  Will consult social work for [employer]. Keep on PCCM service.  Likely will be ready for d/c soon.  11/8 await insurance approval for SNF. Approved 11-8 will go to [facility].  Brett Canales Adrien Shankar ACNP [facility] PCCM Pager [phone] till 3 pm If no answer page [phone] 02/25/2012, 10:40 AM  *Care during the described time  interval was provided by me and/or other providers on the critical care team. I have reviewed this patient's available data, including medical history, events of note, physical examination and test results as part of my evaluation.  Awaiting placement, insurance to approve SNF placement, continue O2, stop abx (course complete 11/7), will quickly taper steroids starting on 11/8 over 3-5 days.

## 2012-02-25 NOTE — Clinical Social Work Note (Signed)
CSW confirmed Pt's SNF selection of [facility]. DC info sent to snf.  Pt transported via [facility].    Frederico Hamman, LCSW [phone]

## 2012-02-25 NOTE — Progress Notes (Signed)
Discharged to snf with family office visits in place teaching done  

## 2012-02-28 NOTE — Clinical Social Work Placement (Signed)
Late entry 02/28/12 for 02/25/12 1600   Clinical Social Work Department CLINICAL SOCIAL WORK PLACEMENT NOTE 02/28/2012  Patient:  Melissa Villa, Melissa Villa  Account Number:  [id number] Admit date:  02/18/2012  Clinical Social Worker:  Frederico Hamman, LCSW  Date/time:  02/24/2012 12:00 M  Clinical Social Work is seeking post-discharge placement for this patient at the following level of care:   SKILLED NURSING   (*CSW will update this form in Epic as items are completed)   02/24/2012  Patient/family provided with [facility] Department of Clinical Social Work's list of facilities offering this level of care within the geographic area requested by the patient (or if unable, by the patient's family).  02/24/2012  Patient/family informed of their freedom to choose among providers that offer the needed level of care, that participate in Medicare, Medicaid or managed care program needed by the patient, have an available bed and are willing to accept the patient.  02/24/2012  Patient/family informed of [facility]' ownership interest in [facility], as well as of the fact that they are under no obligation to receive care at this facility.  PASARR submitted to [employer] on 02/24/2012 PASARR number received from [employer] on 02/24/2012  FL2 transmitted to all facilities in geographic area requested by pt/family on  02/24/2012 FL2 transmitted to all facilities within larger geographic area on   Patient informed that his/her managed care company has contracts with or will negotiate with  certain facilities, including the following:     Patient/family informed of bed offers received:  02/24/2012 Patient chooses bed at [facility] Physician recommends and patient chooses bed at    Patient to be transferred to [facility] on  02/25/2012 Patient to be transferred to facility by [facility]  The following physician request were entered in Epic:   Additional  Comments:

## 2012-02-29 ENCOUNTER — Encounter: Payer: Self-pay | Admitting: Radiation Oncology

## 2012-03-01 NOTE — Addendum Note (Signed)
Encounter addended by: Delynn Flavin, RN on: 03/01/2012  7:18 PM<BR>     Documentation filed: Charges VN

## 2012-03-01 NOTE — Addendum Note (Signed)
Encounter addended by: Delynn Flavin, RN on: 03/01/2012  7:17 PM<BR>     Documentation filed: Charges VN

## 2012-03-06 ENCOUNTER — Encounter: Payer: Medicare Other | Admitting: *Deleted

## 2012-03-07 ENCOUNTER — Encounter: Payer: Self-pay | Admitting: *Deleted

## 2012-03-08 ENCOUNTER — Encounter: Payer: Self-pay | Admitting: *Deleted

## 2012-03-09 ENCOUNTER — Encounter: Payer: Self-pay | Admitting: Internal Medicine

## 2012-03-09 ENCOUNTER — Ambulatory Visit (INDEPENDENT_AMBULATORY_CARE_PROVIDER_SITE_OTHER): Payer: Medicare Other | Admitting: *Deleted

## 2012-03-09 DIAGNOSIS — I442 Atrioventricular block, complete: Secondary | ICD-10-CM

## 2012-03-09 LAB — PACEMAKER DEVICE OBSERVATION
AL AMPLITUDE: 2.7 mv
AL THRESHOLD: 0.75 V
BAMS-0001: 150 {beats}/min
DEVICE MODEL PM: 2277328
RV LEAD AMPLITUDE: 12 mv
RV LEAD THRESHOLD: 1.25 V

## 2012-03-09 NOTE — Progress Notes (Signed)
Pacer check in clinic  

## 2012-03-10 ENCOUNTER — Other Ambulatory Visit (HOSPITAL_BASED_OUTPATIENT_CLINIC_OR_DEPARTMENT_OTHER): Payer: Medicare Other | Admitting: Lab

## 2012-03-10 ENCOUNTER — Telehealth: Payer: Self-pay | Admitting: Oncology

## 2012-03-10 ENCOUNTER — Ambulatory Visit (HOSPITAL_BASED_OUTPATIENT_CLINIC_OR_DEPARTMENT_OTHER): Payer: Medicare Other | Admitting: Oncology

## 2012-03-10 VITALS — BP 134/68 | HR 99 | Temp 97.6°F | Resp 20 | Ht 65.0 in | Wt 282.7 lb

## 2012-03-10 DIAGNOSIS — D381 Neoplasm of uncertain behavior of trachea, bronchus and lung: Secondary | ICD-10-CM

## 2012-03-10 DIAGNOSIS — C44599 Other specified malignant neoplasm of skin of other part of trunk: Secondary | ICD-10-CM

## 2012-03-10 DIAGNOSIS — C50919 Malignant neoplasm of unspecified site of unspecified female breast: Secondary | ICD-10-CM

## 2012-03-10 DIAGNOSIS — R599 Enlarged lymph nodes, unspecified: Secondary | ICD-10-CM

## 2012-03-10 DIAGNOSIS — Z853 Personal history of malignant neoplasm of breast: Secondary | ICD-10-CM

## 2012-03-10 LAB — CBC WITH DIFFERENTIAL/PLATELET
Basophils Absolute: 0 10*3/uL (ref 0.0–0.1)
EOS%: 1.4 % (ref 0.0–7.0)
HGB: 9.2 g/dL — ABNORMAL LOW (ref 11.6–15.9)
MCH: 22.5 pg — ABNORMAL LOW (ref 25.1–34.0)
MCV: 73.6 fL — ABNORMAL LOW (ref 79.5–101.0)
MONO%: 6.9 % (ref 0.0–14.0)
RBC: 4.08 10*6/uL (ref 3.70–5.45)
RDW: 23.7 % — ABNORMAL HIGH (ref 11.2–14.5)

## 2012-03-10 LAB — COMPREHENSIVE METABOLIC PANEL (CC13)
AST: 17 U/L (ref 5–34)
Albumin: 2.8 g/dL — ABNORMAL LOW (ref 3.5–5.0)
Alkaline Phosphatase: 77 U/L (ref 40–150)
BUN: 17 mg/dL (ref 7.0–26.0)
Potassium: 4.4 mEq/L (ref 3.5–5.1)

## 2012-03-10 NOTE — Progress Notes (Signed)
ID: [name]   DOB: 02/27/1946  MR#: [id number]  WUJ#:[id number]  PCP: Melissa Ly, MD GYN:  SU:  OTHER MD: Melissa Villa   HISTORY OF PRESENT ILLNESS: Melissa Villa has a history of remote bilateral breast cancer, as noted below. More recently, on 01/19/2012, she presented for routine screening mammography, but it was found that her right breast had shrunk considerably, with increased density. It was difficult to pull away the breast from the chest wall for imaging.  Accordingly an ultrasound was obtained this showed a 3.5 cm irregular microlobulated mass parallel to the chest wall. There was no increased Doppler activity. He was difficult to tell whether this was recurrent tumor or progressive scarring. 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 HER-2 was amplified. Her subsequent history is as detailed below  INTERVAL HISTORY: Melissa Villa returns today for followup of her locally advanced right breast cancer. She is currently residing at [facility], but tells me she is "just about back to baseline" and likely will be going home in the next few day's. She has received 3 doses of fulvestrant so far with good tolerance.  REVIEW OF SYSTEMS: She is walking much better, using oxygen 24 hours a day. She has headaches, and pain in the right chest wall area, and she is taking oxycodone, Ultram, and Lyrica for this. She has no visual changes, no nausea or vomiting, and normal bowel movements and bladder control. A detailed review of systems was otherwise stable what is bothering her the most is the fact that the right breast is so hard and uncomfortable.  PAST MEDICAL HISTORY: Past Medical History  Diagnosis Date  . CHF (congestive heart failure)      2-D echo 02/19/2009 showed left ventricle cavity size normal; systolic function normal, estimated ejection  fraction 55%; wall motion normal; no regional wall motion abnormalities; PA peak pressure 47mm Hg.  [name] COPD (chronic obstructive pulmonary disease)     Pulmonary function tests on 08/05/2010 showed mixed obstructive and restrictive lung disease with no significant response to bronchodilator, and decreased diffusion capacity that corrects to normal range when adjusted for the inhaled alveolar volume.  . Degenerative joint disease   . AV block, complete     S/P dual-chamber for symptomatic episodes of bradycardia and complete heart block.  . Pacemaker     S/P dual-chamber for symptomatic episodes of bradycardia and complete heart block.  . Adenomatous polyp of colon     Tubular adenomas X 2 found 06/1999; negative screening colonoscopy 02/13/2004 by Melissa Villa.  . Hypercholesteremia   . Obstructive sleep apnea     Baseline diagnostic nocturnal polysomnogram on July 27, 2005 showed severe obstructive sleep apnea/hypopnea syndrome, with AHI 153.2 per hour.  Last nocturnal polysomnogram done for CPAP titration was 05/01/2009; CPAP was titrated to 13 CWP, AHI 1.8 per hour using a large Respironics FitLife Full Face Mask with heated humidifier.  . Peripheral autonomic neuropathy due to diabetes mellitus   . Carcinoid tumor      S/P  right lower lobe lobectomy 06/13/2001 by Melissa Villa  . Constipation   . Hand pain   . Palpitation   . Rib pain     right sided   . DM type 2 (diabetes mellitus, type 2)   . Skin rash   . Urinary incontinence   . Proliferative diabetic retinopathy(362.02)  S/P panretinal photocoagulation by Melissa Villa  . Vitreous hemorrhage     With tractional detachment, left eye; S/P posterior vitrectomy with membrane peel by Melissa Villa 04/06/2005  . Dyspnea on exertion     Cardiac cath 06/17/2005 by Melissa Villa showed LVEF 50-55%, 30% proximal LAD stenosis, small diagonals.   . Shingles rash 03/25/11  . Breast cancer     S/P left lumpectomy and axillary  lymph node dissection December 1992 for a T1 N0 medullary breast cancer, no lymph node involvement, treated with tamoxifen for 2 years; S/P right lumpectomy and axillary lymph node dissection August 1995 for a T1 N0 microinvasive breast cancer, treated with Arimidex for 5 years.  Patient is followed by Melissa Villa at the cancer center.  . Breast cancer, stage 3 102/13    right hx lumpectomy 1994, bx today=invasive ductal ca  . Allergy     thinitis  . Asthma   . Bronchitis     hx  . Cataract     b/l surgery  . DM hyperosmolarity type II   . Hypertension   . COPD (chronic obstructive pulmonary disease)   . Diastolic CHF   . TIA (transient ischemic attack)   . OSA (obstructive sleep apnea)   . Chronic respiratory failure with hypoxia   . Anemia   . DJD (degenerative joint disease)   . AV block   . DM retinopathy   . DM neuropathies   . Carcinoid tumor of lung   . Breast cancer     PAST SURGICAL HISTORY: Past Surgical History  Procedure Date  . Mastectomy partial / lumpectomy w/ axillary lymphadenectomy Sep 19, 1990, 09/18/93    S/P left lumpectomy and axillary lymph node dissection December 1992 for a T1 N0 medullary breast cancer, no lymph node involvement, treated with tamoxifen for 2 years; S/P right lumpectomy and axillary lymph node dissection August 1995 for a T1 N0 microinvasive breast cancer, treated with Arimidex for 5 years.  Patient is followed by Melissa Villa at the cancer center.  . Lung lobectomy 06/13/2001    S/P right lower lobe lobectomy 06/13/2001 by Melissa Villa  . Pacemaker insertion 04/20/2001  . Mastectomy partial / lumpectomy   . Lobectomy   . Pacemaker insertion   . Cholecystectomy   . Tonsillectomy     FAMILY HISTORY Family History  Problem Relation Age of Onset  . Breast cancer Mother 67    Deceased 09-19-2002  . Diabetes type II Mother   . Heart attack Brother   . Cervical cancer Neg Hx   . Colon cancer Neg Hx   . Breast cancer Mother   . Diabetes Mellitus  II Mother   . Hypertension Brother   . Coronary artery disease Brother   . Stroke Brother   . Diabetes type II Brother   . Hypertension Sister    the patient has no information regarding her father. Her mother died at the age of 64 with end-stage renal disease. She had been diagnosed with breast cancer in her mid 10s. Joe had 4 brothers and 3 sisters. One brother died from a drug overdose and one from AIDS. One sister died from EtOH abuse. There is no other history of breast cancer in the family to her knowledge.  GYNECOLOGIC HISTORY: She is GX P3, first live birth age 57. She stopped having periods in the 1990s.  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 She was widowed in 18-Sep-2005. She  lives by herself. Her 3 children live in [place]. When asked who she would call an emergency, she says "I would have to think about it; we are not close". She has 9 grandchildren, but is only in touch with 3. She attends a [employer].   ADVANCED DIRECTIVES: she intends to name her son Derwood Kaplan (works for [employer], phone [phone]) as healthcare power of attorney; she received the appropriate forms to complete and notarized today (03/10/2012)  HEALTH MAINTENANCE: History  Substance Use Topics  . Smoking status: Former Smoker -- 1.0 packs/day for 15 years    Types: Cigarettes    Quit date: 04/19/2001  . Smokeless tobacco: Not on file  . Alcohol Use: No     Colonoscopy:  PAP:  Bone density:  Lipid panel:  No Known Allergies  Current Outpatient Prescriptions  Medication Sig Dispense Refill  . albuterol (PROAIR HFA) 108 (90 BASE) MCG/ACT inhaler Inhale 2 puffs into the lungs 4 (four) times daily as needed for wheezing or shortness of breath.  1 Inhaler  11  . amLODipine (NORVASC) 10 MG tablet Take 1 tablet (10 mg total) by mouth daily.  31 tablet  11  . Bepotastine Besilate (BEPREVE) 1.5 % SOLN Place 1 drop into both eyes 2 (two) times daily.      . carvedilol  (COREG) 12.5 MG tablet Take 2 tablets (25 mg total) by mouth 2 (two) times daily.  120 tablet  6  . cetirizine (ZYRTEC) 10 MG tablet TAKE ONE (1) TABLET EACH DAY FOR        SEASONAL ALLERGIES  30 tablet  9  . Dexlansoprazole (DEXILANT) 60 MG capsule Take 60 mg by mouth every morning. Take 15 minutes before breakfast.      . dipyridamole-aspirin (AGGRENOX) 200-25 MG per 12 hr capsule Take 1 capsule by mouth daily. Decrease to one for 7 days starting 11/21 to see if headaches improve      . Fluticasone-Salmeterol (ADVAIR DISKUS) 250-50 MCG/DOSE AEPB Inhale 1 puff into the lungs 2 (two) times daily.  60 each  11  . glucose blood (ACCU-CHEK AVIVA PLUS) test strip 1 each by Other route as needed. Use as to check blood sugar up to 3 times daily as instructed DX code:250.00      . insulin aspart (NOVOLOG) 100 UNIT/ML injection Inject 3 Units into the skin 3 (three) times daily with meals.      . insulin aspart (NOVOLOG) 100 UNIT/ML injection Inject 0-5 Units into the skin at bedtime. Sliding scale      . insulin glargine (LANTUS) 100 UNIT/ML injection Inject 10 Units into the skin at bedtime.      . Insulin Pen Needle (B-D ULTRAFINE III SHORT PEN) 31G X 8 MM MISC Use as directed for insulin injections.  100 each  3  . irbesartan (AVAPRO) 300 MG tablet Take 1 tablet (300 mg total) by mouth daily.      . Lancets Misc. (ACCU-CHEK FASTCLIX LANCET) KIT by Does not apply route. Use as to check blood sugar up to 3 times daily as instructed DX code:250.00       . oxyCODONE (OXY IR/ROXICODONE) 5 MG immediate release tablet Take 1-2 tablets (5-10 mg total) by mouth every 6 (six) hours as needed.  30 tablet    . predniSONE (DELTASONE) 20 MG tablet Take 1 tablet (20 mg total) by mouth daily with breakfast.      . pregabalin (LYRICA) 100 MG capsule Take 200 mg by mouth  2 (two) times daily.       [name] tiotropium (SPIRIVA) 18 MCG inhalation capsule Place 1 capsule (18 mcg total) into inhaler and inhale daily.  30 capsule   11  . traMADol (ULTRAM) 50 MG tablet Take 1 tablet (50 mg total) by mouth every 6 (six) hours as needed for pain (cough).  50 tablet  0  . [DISCONTINUED] insulin aspart (NOVOLOG) 100 UNIT/ML injection Inject 0-20 Units into the skin 3 (three) times daily with meals.  1 vial  1  . [DISCONTINUED] insulin aspart (NOVOLOG) 100 UNIT/ML injection Inject 0-5 Units into the skin at bedtime.  1 vial  1  . [DISCONTINUED] insulin glargine (LANTUS) 100 UNIT/ML injection Inject 42 Units into the skin daily.  15 mL  5  . Calcium Carbonate-Vitamin D (CALCIUM 600+D) 600-400 MG-UNIT per tablet Take 1 tablet by mouth 2 (two) times daily.        . multivitamin (THERAGRAN) per tablet Take 1 tablet by mouth daily.          OBJECTIVE: Middle-aged [place] American woman wearing an oxygen cannula   Filed Vitals:   03/10/12 0935  BP: 134/68  Pulse: 99  Temp: 97.6 F (36.4 C)  Resp: 20     Body mass index is 47.04 kg/(m^2).    ECOG FS: 2  Sclerae unicteric Oropharynx clear No cervical or supraclavicular adenopathy Lungs no rales or rhonchi Heart regular rate and rhythm Abd obese, soft, positive bowel sounds MSK no focal spinal tenderness Neuro: nonfocal; she is alert and oriented x3 Breasts: The right breast is status post remote lumpectomy and radiation. There is now an area of induration extending over most of the breast and beyond the breast itself into the surrounding chest wall. I do not palpate any axillary adenopathy. The left breast is unremarkable.  LAB RESULTS: Lab Results  Component Value Date   WBC 8.0 03/10/2012   NEUTROABS 5.7 03/10/2012   HGB 9.2* 03/10/2012   HCT 30.0* 03/10/2012   MCV 73.6* 03/10/2012   PLT 273 03/10/2012      Chemistry      Component Value Date/Time   NA 141 02/24/2012 0610   NA 141 01/24/2012 1002   K 3.6 02/24/2012 0610   K 4.6 01/24/2012 1002   CL 99 02/24/2012 0610   CL 110* 01/24/2012 1002   CO2 31 02/24/2012 0610   CO2 21* 01/24/2012 1002   BUN 22 02/24/2012  0610   BUN 16.0 01/24/2012 1002   CREATININE 0.86 02/24/2012 0610   CREATININE 1.0 01/24/2012 1002   CREATININE 0.92 09/30/2011 0922      Component Value Date/Time   CALCIUM 9.5 02/24/2012 0610   CALCIUM 9.0 01/24/2012 1002   ALKPHOS 43 02/21/2012 0500   ALKPHOS 60 01/24/2012 1002   AST 17 02/21/2012 0500   AST 18 01/24/2012 1002   ALT 12 02/21/2012 0500   ALT 22 01/24/2012 1002   BILITOT 0.2* 02/21/2012 0500   BILITOT 0.30 01/24/2012 1002       Lab Results  Component Value Date   LABCA2 18 06/26/2007    No components found with this basename: [id number]    No results found for this basename: INR:1;PROTIME:1 in the last 168 hours  Urinalysis    Component Value Date/Time   COLORURINE YELLOW 02/18/2012 1627   APPEARANCEUR CLEAR 02/18/2012 1627   LABSPEC 1.018 02/18/2012 1627   PHURINE 6.0 02/18/2012 1627   GLUCOSEU NEGATIVE 02/18/2012 1627   HGBUR NEGATIVE 02/18/2012 1627  BILIRUBINUR NEGATIVE 02/18/2012 1627   KETONESUR NEGATIVE 02/18/2012 1627   PROTEINUR 100* 02/18/2012 1627   UROBILINOGEN 0.2 02/18/2012 1627   NITRITE NEGATIVE 02/18/2012 1627   LEUKOCYTESUR NEGATIVE 02/18/2012 1627    STUDIES: Ct Head Wo Contrast  02/22/2012  *RADIOLOGY REPORT*  Clinical Data: Headache; subconjunctival right eye hemorrhage. Fell at [facility] 4 days ago.  CT HEAD WITHOUT CONTRAST  Technique:  Contiguous axial images were obtained from the base of the skull through the vertex without contrast.  Comparison: None.  Findings: There is no evidence of acute infarction, mass lesion, or intra- or extra-axial hemorrhage on CT.  Mild periventricular white matter change may reflect mild small vessel ischemic microangiopathy.  A likely chronic lacunar infarct is noted within the right pons.  The cerebellum and fourth ventricle are within normal limits.  The third and lateral ventricles, and basal ganglia are unremarkable in appearance.  The cerebral hemispheres are symmetric in appearance, with normal gray-white  differentiation.  No mass effect or midline shift is seen.  There is no evidence of fracture; scattered small lucencies in the calvarium are of uncertain significance.  Would suggest clinical correlation to exclude multiple myeloma or metastatic disease, given the patient's history.  The visualized portions of the orbits are within normal limits.  The paranasal sinuses and mastoid air cells are well-aerated.  No significant soft tissue abnormalities are seen.  IMPRESSION:  1.  No evidence of traumatic intracranial injury or fracture. 2.  Likely mild small vessel ischemic microangiopathy and chronic lacunar infarct within the right pons. 3.  Scattered small lucencies in the calvarium, of uncertain significance.  Would suggest clinical correlation to exclude multiple myeloma or metastatic disease, given the patient's history.  These could remain within normal limits.   Original Report Authenticated By: Tonia Ghent, M.D.    Dg Chest Port 1 View  02/22/2012  *RADIOLOGY REPORT*  Clinical Data: Follow up CHF, history breast cancer  PORTABLE CHEST - 1 VIEW  Comparison: Portable exam 0642 hours compared to 02/21/2012  Findings: Right costophrenic angle excluded. Right subclavian transvenous pacemaker leads again noted, the distal lead incompletely imaged. Enlargement of cardiac silhouette with pulmonary vascular congestion. Perihilar infiltrates consistent with pulmonary edema. Fluid at minor fissure. Question atelectasis versus consolidation in the right lower lobe Surgical clips left axilla likely reflect prior axillary lymph node dissection. No pneumothorax.  IMPRESSION: Enlargement of cardiac silhouette with pulmonary vascular congestion mild pulmonary edema. Small right pleural effusion with persistent atelectasis versus consolidation right lower lobe.   Original Report Authenticated By: Ulyses Southward, M.D.    Dg Chest Port 1 View  02/21/2012  *RADIOLOGY REPORT*  Clinical Data: Check endotracheal tube  PORTABLE  CHEST - 1 VIEW  Comparison: 02/20/2012  Findings: Endotracheal tube in place with tip 3.6 cm above the carina.  Stable NG tube position.  Dual lead cardiac pacemaker is unchanged in position.  Again noted status post left axillary lymph node dissection.  Cardiomediastinal silhouette is stable. Again noted central vascular congestion.  Persistent atelectasis or infiltrate in the right lung.  Small left basilar atelectasis.  IMPRESSION: Stable support apparatus.  Central vascular congestion.  Again noted atelectasis or infiltrate in the right lung.  Small left basilar atelectasis.   Original Report Authenticated By: Natasha Mead, M.D.    Dg Chest Port 1 View  02/20/2012  *RADIOLOGY REPORT*  Clinical Data: Pulmonary edema.  Endotracheal tube.  PORTABLE CHEST - 1 VIEW  Comparison: 02/19/2012  Findings: Endotracheal tube with tip about 4.2  cm above the carina. Enteric tube remains in place.  Tip is not visualized.  Stable appearance of cardiac pacemaker.  Persistent cardiac enlargement with mild pulmonary vascular congestion.  Infiltration or atelectasis in the right lung base is increasing.  Probable small bilateral pleural effusions.  No pneumothorax.  IMPRESSION: Cardiac enlargement with pulmonary vascular congestion are stable. Increasing infiltration or atelectasis in the right lung base. Probable small bilateral pleural effusions.   Original Report Authenticated By: Burman Nieves, M.D.    Dg Chest Port 1 View  02/19/2012  *RADIOLOGY REPORT*  Clinical Data: Respiratory distress, ventilatory support  PORTABLE CHEST - 1 VIEW  Comparison: 02/18/2012  Findings: Endotracheal tube 3.2 cm above the carina.  NG tube enters the stomach with the tip not visualized.  Right subclavian pacer noted.  Stable cardiac enlargement with diffuse interstitial edema pattern. Right effusion persists with persistent right base atelectasis / consolidation.  No interval change.  No pneumothorax.  IMPRESSION: Stable CHF pattern with a  persistent right effusion and associated right base atelectasis / consolidation.   Original Report Authenticated By: Judie Petit. Shick, M.D.    Dg Chest Portable 1 View  02/18/2012  *RADIOLOGY REPORT*  Clinical Data: Respiratory distress.  PORTABLE CHEST - 1 VIEW  Comparison: Chest x-ray 02/18/2012.  Findings: An endotracheal tube is in place with tip 2.7 cm above the carina. A nasogastric tube is seen extending into the stomach, however, the tip of the nasogastric tube extends below the lower margin of the image.  Lung volumes are low.  Bibasilar opacities (right greater than left) may reflect areas of atelectasis and/or consolidation.  Small right pleural effusion. There is cephalization of the pulmonary vasculature and slight indistinctness of the interstitial markings suggestive of mild pulmonary edema.  The patient is severely rotated to the right which causes gross distortion of cardiomediastinal contours.  Heart size does appear enlarged.  Atherosclerosis in the thoracic aorta. Right-sided pacemaker device in place with lead tips projecting over the expected location of the right atrium and right ventricular apex.  Surgical clips project over the axillary regions bilaterally.  IMPRESSION: 1.  Support apparatus and postoperative changes, as above. 2.  Findings suggest congestive heart failure, as above. 3.  Persistent bibasilar atelectasis and/or consolidation with superimposed small right pleural effusion. 4.  Atherosclerosis.   Original Report Authenticated By: Trudie Reed, M.D.    Dg Chest Portable 1 View  02/18/2012  *RADIOLOGY REPORT*  Clinical Data: Respiratory distress  PORTABLE CHEST - 1 VIEW  Comparison: None  Findings: The endotracheal tube tip is in satisfactory position above the carina.  The patient has a right chest wall pacer device with lead in the right atrial appendage and right ventricle.  The heart size is enlarged.  There is a right pleural effusion. Moderate interstitial edema is  present. Chronic-appearing right posterior lateral rib fracture is noted.  There are surgical clips identified within the axillary regions.  IMPRESSION:  1.  Suspect congestive heart failure. 2.  Endotracheal tube tip in satisfactory position above the carina.   Original Report Authenticated By: Signa Kell, M.D.    Ct Chest W Contrast  01/27/2012  *RADIOLOGY REPORT*  Clinical Data: Recently diagnosed right breast carcinoma.  Previous breast and lung carcinoma.  Previous surgery and radiation therapy. COPD.  CT CHEST WITH CONTRAST  Technique:  Multidetector CT imaging of the chest was performed following the standard protocol during bolus administration of intravenous contrast.  Contrast: 80mL OMNIPAQUE IOHEXOL 300 MG/ML  SOLN  Comparison: None.  Findings: There is asymmetric soft tissue density seen in the right breast, consistent with the patient's recent diagnosis of right breast carcinoma.  No deep chest wall invasion demonstrated.  No evidence of axillary lymphadenopathy.  Surgical clips are seen in both axillary regions.  Transvenous pacemaker is also seen in the right anterior chest wall.  Mild mediastinal lymphadenopathy is seen in the prevascular and lateral aortic regions, the aorta pulmonary window, right paratracheal region and subcarinal region.  The largest confluent area of adenopathy in the right paratracheal region measures 2.3 x 3.0 cm.  There is mild bilateral hilar lymphadenopathy.  Chronic postsurgical changes are seen from previous right lower lobectomy.  No suspicious pulmonary nodules or masses are identified.  There is no evidence of pulmonary infiltrate or central endobronchial lesion.  No evidence of pleural or pericardial effusion.  A small right adrenal mass is seen measuring 1.4 x 2.3 cm.  This has attenuation value of 28 HU on this contrast-enhanced study, which is indeterminate.  No other mass or adenopathy seen with the visualized portion of upper abdomen.  No suspicious bone  lesions are identified.  IMPRESSION:  1.  Mild mediastinal and bilateral hilar lymphadenopathy, consistent with metastatic disease. 2.  No evidence of axillary lymphadenopathy or pulmonary metastases. 3.  2.3 cm indeterminate right adrenal mass.  PET CT scan or abdomen MRI is recommended for further evaluation.  This recommendation follows ACR consensus guidelines:  Managing Incidental Findings on Abdominal CT:  White Paper of the ACR Incidental Findings Committee.  J Am Coll Radiol 9528;4:132-440   Original Report Authenticated By: Danae Orleans, M.D.     ASSESSMENT: 66 y.o. [place] woman status post -   (1) Left lumpectomy and axillary lymph node dissection December 1992 for a T1N0 medullary breast cancer treated with tamoxifen for 2 years.  (2) Status post right lumpectomy and axillary lymph node dissection August 1995 for a T1N0 microinvasive breast cancer treated with Arimidex for 5 years.  (3) Adjuvantly she was treated with right breast tangents to a dose of 5040 cGy in that her tumor bed was boosted for a further 1000 cGy in 5 sessions, completing all therapy in 02/12/1994. [Of note she had a right axillary dissection and all 20 lymph nodes were negative for metastatic disease].   (4) Status post right lower lobe lobectomy February 2003 for a 5 mm lung carcinoid.  (5) Right breast biopsy 01/19/2012 shows an invasive ductal carcinoma, grade 2, estrogen and progesterone receptor positive, with an elevated MIB-1, HER-2 amplified with a ratio of 2.29 by [facility].   (6) Clinically the tumor extends over most of the breast and into the surrounding chest wall skin. Radiation oncology feels at most 20 gray additional radiation could be given to this area, which is unlikely to be of benefit, and therefore this is not contemplated.   (7) Extensive staging studies including bone scan, and CT scans of the head, chest, abdomen, and pelvis, show no definite areas of distant disease (there are some  mediastinal and bilateral hilar lymph nodes and some right adrenal range is of uncertain significance).  (6) fulvestrant started 01/28/2012  (7) trastuzumab to be started 03/24/2012; echo 02/20/2012 shows an EF of 55%   PLAN: Melissa Villa, Melissa Villa    03/10/2012  Is tolerating the fulvestrant well. So far we do not see evidence of response, but this can take several months with this medication. We're going to start Herceptin the first Friday in December. Today we talked  about the possible toxicities side effects and complications of this medication. She understands in particular the risk of weakening the heart muscle. Today we also talked about access issues, and we will try to get a port placed next week. She will receive Herceptin every 2 weeks so that it falls in more easily with her every 4 week fulvestrant treatments. We will repeat an echocardiogram in January before she sees me again 05/20/2011. All this is very complex, and we spent well over an hour today discussing her situation. I have also encouraged her to complete her healthcare power of attorney documents. She knows to call for any problems that may develop before her next visit here.

## 2012-03-10 NOTE — Telephone Encounter (Signed)
gve the pt her jan 2014 appt calendar. Along with the appt for the echo in jan at [facility]. Pt is aware that she will be contacted with an appt for the port placement and the tx appts.

## 2012-03-10 NOTE — Telephone Encounter (Signed)
S/w tanya from [facility] regarding this pt's echo appt for jan 2014 at [facility] on [address]. appt has been moved from dec to [facility].

## 2012-03-13 ENCOUNTER — Telehealth: Payer: Self-pay | Admitting: Oncology

## 2012-03-13 NOTE — Telephone Encounter (Signed)
S/w tanya from the [facility] regarding the pt having her chemo appt on 03/24/2012 and for them to pick up a calendar with the rest of the appt dates.

## 2012-03-14 ENCOUNTER — Encounter: Payer: Self-pay | Admitting: Internal Medicine

## 2012-03-14 ENCOUNTER — Ambulatory Visit (INDEPENDENT_AMBULATORY_CARE_PROVIDER_SITE_OTHER): Payer: Medicare Other | Admitting: Internal Medicine

## 2012-03-14 VITALS — BP 120/74 | HR 66 | Ht 64.0 in | Wt 284.6 lb

## 2012-03-14 DIAGNOSIS — G4733 Obstructive sleep apnea (adult) (pediatric): Secondary | ICD-10-CM

## 2012-03-14 DIAGNOSIS — J9610 Chronic respiratory failure, unspecified whether with hypoxia or hypercapnia: Secondary | ICD-10-CM

## 2012-03-14 MED ORDER — ALBUTEROL SULFATE (2.5 MG/3ML) 0.083% IN NEBU: 2.5000 mg | INHALATION_SOLUTION | Freq: Four times a day (QID) | RESPIRATORY_TRACT | Status: DC | PRN

## 2012-03-14 NOTE — Progress Notes (Signed)
Patient ID: CARMINA WALLE, female    DOB: 10-23-1945, 66 y.o.   MRN: [id number]  HPI 10/12/10- 58 yoF former smoker seen at kind request of Dr Meredith Pel for sleep evaluation. She complains of difficulty maintaining sleep, waking 4-5 x/ night. Has had 2 sleep studies positive for OSA. Sleeps every night with CPAP, but can't maintain sleep and fights daytime somnolence.  CPAP keeps her awake, but cna't fall asleep without it. The mask cuts her nose and leaks. Has tried Ambien, Rozerem and others. Lives alone and unsure about snoring or leg movement.  Has had a significant cough with a cold but feeling better. R lobectomy 2003 for ? Cancer vs benign nodule. Lumpectomy breast for cancer.  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 She is willing "because I want some rest". New full face mask fits better.   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 Key was that her O2 sat wouldn't stay up even on 1 L/M, recording 73 minutes less than 88%. I explained this may be why she feels unable to sleep well.  She had restrictive lung disease on pulmonary function testing 08/05/2010 which may reflect her lobectomy as well as radiation therapy for her breast cancer and her obesity.  02/03/11-  47 yoF former smoker followed for OSA, restrictive lung disease/ hx R lobectomy/ Carcinoid tumor, complicated by hx breast Ca She is using CPAP at 11 CWP plus oxygen at 2 L/Advanced all night every night. Insomnia remains a problem, she doesn't sleep soundly through the night and is sleepy without naps in the daytime. Temazepam 15 mg is not enough. She takes 2 hours to fall asleep and then if she  wakes to the bathroom she again needs a long time before she can return to sleep.  03/25/11-   26 yoF former smoker followed for OSA, restrictive lung disease/ hx R lobectomy/ Carcinoid tumor, complicated by hx breast Ca Acute visit- 2 weeks ago developed pruritic burning rash on the left ribs. We called in valacyclovirand a prednisone taper based on her verbal description and she comes is requested to let us see the rash. 2 small children weather today, neither of whom has had chickenpox  05/05/11- 65 yoF former smoker followed for OSA, restrictive lung disease/ hx R lobectomy/ Carcinoid tumor, complicated by hx breast Ca  Benzonatate has been a big help but she still coughs some- dry.  CXR 06/22/2010 showed normal heart mild vascular congestion and pacemaker with surgical clips in both axillae.  With clonazepam she has less waking.  She wants to know why she is waking up and we talked about her sleep apnea status that she continues CPAP. We also discussed whether Advair at night would be keeping her awake.  07/28/11-  13 yoF former smoker followed for OSA, restrictive lung disease/ hx R lobectomy/ Carcinoid tumor, complicated by hx breast Ca. Still having increased SOB with walking and gives out quickly(even making bed or washing dishes).Also gets headaches with it. Stays tired, not relieved by sleep. Feels "busy brain" although she dreams. Not taking clonazepam. Does use CPAP with oxygen every night. Insurance won't pay for benzonatate. Still uses Spiriva, Advair, rescue inhaler.  Off of amitriptyline. GERD is under much better control with Paxil and she burps a lot. Chest x-ray 06/28/2011-no active disease. Left pacemaker. Postoperative changes. IMPRESSION:  1. Stable postop change involving the right hemithorax.  Original Report Authenticated By: Rosealee Albee, M.D.    12/24/11- 62 yoF former smoker followed for OSA, restrictive lung disease/ hx R lobectomy/ Carcinoid tumor, complicated by hx  breast Ca. ACUTE VISIT: increased SOB during day now; feels full in chest-unable to cough anything up; uses O2 at night only; unsure of pressure for CPAP-wakes up coughing(has done this chronically). CPAP 13 with O2 2 L/Advanced Not aware of reflux. Uses rescue inhaler at least once or twice daily. Denies pain or swelling in legs. Feels that her heart "labors" during exercise without palpitation or chest pain. COPD assessment test (CAT) 36/40  03/14/12- 66 yoF former smoker followed for OSA, restrictive lung disease/ hx R lobectomy/ Carcinoid tumor, complicated by hx breast Ca. She emphasizes that breathing is much better. Chest x-ray September had shown CHF. Now she is having much less cough. She had been hospitalized at [facility] for syncope, spent time at nursing facility and goes home tomorrow. Hospital diagnoses were acute on chronic respiratory failure, acute pulmonary edema, diabetes. History of recurrent right breast cancer with chemotherapy. CPAP 11 + O2 2l/Advanced used every night. CXR 12/24/11-reviewed *RADIOLOGY REPORT*  Clinical Data: Shortness of breath and cough. COPD. Asthma.  CHEST - 2 VIEW  Comparison: 06/28/2011  Findings: There is new cardiomegaly with pulmonary vascular  congestion and mild interstitial edema with tiny bilateral pleural  effusions. Findings are consistent with congestive heart failure.  Dual lead pacer in place. No acute osseous abnormality.  IMPRESSION:  Congestive heart failure.  Original Report Authenticated By: Gwynn Burly, M.D.    Review of Systems- see HPI Constitutional:   No weight loss, night sweats,  Fevers, chills, fatigue, lassitude. HEENT:   No headaches,  Difficulty swallowing,  Tooth/dental problems,  Sore throat,                No sneezing, itching, ear ache, nasal congestion, post nasal drip,  CV:  No chest pain, orthopnea, PND, swelling in lower extremities, anasarca, dizziness, palpitations GI  No heartburn, indigestion, abdominal  pain, nausea, vomiting, diarrhea, change in bowel habits, loss of appetite Resp: + Better Shortness of breath with exertion, not at rest.   +Non-productive cough,  No coughing up of blood.  No change in color of mucus.  No wheezing.  Skin: . No rash GU:  MS: Psych:  No change in mood or affect. No depression or anxiety.  No memory loss.  Objective:   Physical Exam BP 120/74  Pulse 66  Ht 5\' 4"  (1.626 m)  Wt 284 lb 9.6 oz (129.094 kg)  BMI 48.85 kg/m2  SpO2 98%  O2 2L General- Alert, Oriented, Affect-appropriate, Distress- none acute, +morbidly obese, wheelchair  Skin-. Lymphadenopathy- none Head- atraumatic Eyes- Gross vision intact, PERRLA, conjunctivae clear secretions Ears- Hearing, canals, Tm normal Nose- Clear, No- Septal dev, mucus, polyps, erosion, perforation   Scar bridge of nose Throat- Mallampati II , mucosa clear , drainage- none, tonsils- atrophic Neck- flexible , trachea midline, no stridor , thyroid nl, carotid no bruit   Heart/CV- RRR , 1/6 systolic murmur right upper sternal area , no gallop  , no rub, nl s1  - JVD- none , edema- none, stasis changes- none, varices- none  Lung- clear to P&A, + harsh cough ; wheeze- none,  ,  dullness-none, rub- none    Chest wall- R pacemaker Abd-  Br/ Gen/ Rectal- Not done, not indicated Extrem-

## 2012-03-14 NOTE — Patient Instructions (Addendum)
Scripts for nebulizer and albuterol  Use up to 4 times daily if needed.

## 2012-03-17 ENCOUNTER — Encounter (HOSPITAL_COMMUNITY): Payer: Self-pay | Admitting: Pharmacy Technician

## 2012-03-20 ENCOUNTER — Other Ambulatory Visit: Payer: Self-pay | Admitting: *Deleted

## 2012-03-20 MED ORDER — LIDOCAINE-PRILOCAINE 2.5-2.5 % EX CREA: TOPICAL_CREAM | CUTANEOUS | Status: DC

## 2012-03-21 ENCOUNTER — Other Ambulatory Visit: Payer: Self-pay | Admitting: Radiology

## 2012-03-22 ENCOUNTER — Ambulatory Visit (INDEPENDENT_AMBULATORY_CARE_PROVIDER_SITE_OTHER): Payer: Medicare Other | Admitting: Internal Medicine

## 2012-03-22 ENCOUNTER — Other Ambulatory Visit (HOSPITAL_COMMUNITY): Payer: Self-pay

## 2012-03-22 ENCOUNTER — Ambulatory Visit (HOSPITAL_COMMUNITY)
Admission: RE | Admit: 2012-03-22 | Discharge: 2012-03-22 | Disposition: A | Discharge status: Home / Self Care | Payer: Medicare Other | Source: Ambulatory Visit | Attending: Internal Medicine | Admitting: Internal Medicine

## 2012-03-22 ENCOUNTER — Encounter: Payer: Self-pay | Admitting: Internal Medicine

## 2012-03-22 VITALS — BP 121/54 | HR 58 | Temp 98.2°F | Wt 294.2 lb

## 2012-03-22 DIAGNOSIS — J4489 Other specified chronic obstructive pulmonary disease: Secondary | ICD-10-CM | POA: Insufficient documentation

## 2012-03-22 DIAGNOSIS — E119 Type 2 diabetes mellitus without complications: Secondary | ICD-10-CM

## 2012-03-22 DIAGNOSIS — C50919 Malignant neoplasm of unspecified site of unspecified female breast: Secondary | ICD-10-CM

## 2012-03-22 DIAGNOSIS — J90 Pleural effusion, not elsewhere classified: Secondary | ICD-10-CM | POA: Insufficient documentation

## 2012-03-22 DIAGNOSIS — I10 Essential (primary) hypertension: Secondary | ICD-10-CM

## 2012-03-22 DIAGNOSIS — J449 Chronic obstructive pulmonary disease, unspecified: Secondary | ICD-10-CM

## 2012-03-22 DIAGNOSIS — R0602 Shortness of breath: Secondary | ICD-10-CM | POA: Insufficient documentation

## 2012-03-22 DIAGNOSIS — I509 Heart failure, unspecified: Secondary | ICD-10-CM

## 2012-03-22 DIAGNOSIS — I5033 Acute on chronic diastolic (congestive) heart failure: Secondary | ICD-10-CM

## 2012-03-22 LAB — CBC WITH DIFFERENTIAL/PLATELET
Basophils Absolute: 0 10*3/uL (ref 0.0–0.1)
HCT: 31.7 % — ABNORMAL LOW (ref 36.0–46.0)
Hemoglobin: 9 g/dL — ABNORMAL LOW (ref 12.0–15.0)
Lymphocytes Relative: 21 % (ref 12–46)
Monocytes Absolute: 0.7 10*3/uL (ref 0.1–1.0)
Monocytes Relative: 8 % (ref 3–12)
Neutro Abs: 5.9 10*3/uL (ref 1.7–7.7)
Neutrophils Relative %: 70 % (ref 43–77)
RDW: 22.2 % — ABNORMAL HIGH (ref 11.5–15.5)
WBC: 8.4 10*3/uL (ref 4.0–10.5)

## 2012-03-22 LAB — COMPLETE METABOLIC PANEL WITH GFR
Alkaline Phosphatase: 87 U/L (ref 39–117)
BUN: 19 mg/dL (ref 6–23)
GFR, Est African American: 68 mL/min
GFR, Est Non African American: 59 mL/min — ABNORMAL LOW
Glucose, Bld: 98 mg/dL (ref 70–99)
Total Bilirubin: 0.2 mg/dL — ABNORMAL LOW (ref 0.3–1.2)

## 2012-03-22 LAB — PRO B NATRIURETIC PEPTIDE: Pro B Natriuretic peptide (BNP): 193.6 pg/mL — ABNORMAL HIGH (ref <126)

## 2012-03-22 LAB — GLUCOSE, CAPILLARY: Glucose-Capillary: 120 mg/dL — ABNORMAL HIGH (ref 70–99)

## 2012-03-22 MED ORDER — FUROSEMIDE 20 MG PO TABS: 20.0000 mg | ORAL_TABLET | Freq: Every day | ORAL | Status: DC

## 2012-03-22 MED ORDER — INSULIN GLARGINE 100 UNIT/ML ~~LOC~~ SOLN: 42.0000 [IU] | Freq: Every day | SUBCUTANEOUS | Status: DC

## 2012-03-22 NOTE — Assessment & Plan Note (Signed)
BP Readings from Last 3 Encounters:  03/22/12 121/54  03/14/12 120/74  03/10/12 134/68    Assessment:  Blood pressure control: controlled  Progress toward BP goal:  at goal  Comments: Currently on amlodipine 10 mg daily, carvedilol 25 mg twice a day,and irbesartan 300 mg daily  Plan:  Medications:  continue current medications  Educational resources provided: brochure  Self management tools provided: home blood pressure logbook  Other plans: I added furosemide 20 mg daily given her evidence of mild pulmonary edema due to diastolic heart failure.

## 2012-03-22 NOTE — Patient Instructions (Addendum)
General Instructions: Start furosemide 20 mg one tablet daily. Continue home oxygen 2 L per minute by nasal cannula at rest. Whenever you are walking, increase flowrate to 3-4 L per minute to maintain oxygen saturation above 90%. Please schedule an appointment with diabetes educator Norm Parcel.  Treatment Goals:  Goals (1 Years of Data) as of 03/22/2012          As of Today 03/14/12 03/10/12 02/25/12 02/25/12     Blood Pressure    . Blood Pressure < 140/80  121/54 120/74 134/68 113/67 134/77     Diet    . Eat more 2-3 cups vegetables each day           Result Component    . HEMOGLOBIN A1C < 7.0          . LDL CALC < 100            Progress Toward Treatment Goals:  Treatment Goal 03/22/2012  Hemoglobin A1C at goal  Blood pressure at goal    Self Care Goals & Plans:  Self Care Goal 03/22/2012  Manage my medications bring my medications to every visit; take my medicines as prescribed  Monitor my health keep track of my blood glucose; check my feet daily  Eat healthy foods eat smaller portions    Home Blood Glucose Monitoring 03/22/2012  Check my blood sugar 2 times a day  When to check my blood sugar before breakfast; before dinner     Care Management & Community Referrals:  Referral 03/22/2012  Referrals made for care management support none needed  Referrals made to community resources (No Data)

## 2012-03-22 NOTE — Assessment & Plan Note (Addendum)
BNP (last 3 results)  Basename 03/22/12 1215 02/19/12 0423 02/18/12 1652  PROBNP 193.6* 511.4* 393.6*    Assessment: Chest x-ray today showed a small right pleural effusion and suspected mild congestive heart failure pattern.  Her symptoms of worsening exertional dyspnea with exertional hypoxia likely represent a combination of her chronic obstructive/restrictive lung disease along with component of diastolic heart failure.  Plan: Start furosemide 20 mg daily.

## 2012-03-22 NOTE — Progress Notes (Signed)
  Subjective:    Patient ID: Melissa Villa, female    DOB: [birth year], 66 y.o.   MRN: [id number]  HPI Patient returns for follow up of her COPD, diabetes mellitus, hypertension, and other chronic medical problems.  Since her last visit here, she was hospitalized with acute respiratory failure and then discharged to a skilled nursing facility because of deconditioning.  She reports that she was discharged home from the skilled nursing facility about one week ago.  Since her discharge home, she reports progressively worsening exertional dyspnea and fatigability.  She reports that she has been compliant with her medications, with the exception that she has been out of the rapid acting insulin and has been taking a lower dose of Lantus then the dose she was previously on.  She is on home oxygen at 2 L per minute by nasal cannula continuously.  When she presented to the clinic today, she was desaturating into the 70-80% range on 2 L of nasal cannula O2 immediately after ambulating, and then returned to a resting oxygen saturation of 94% on 2 L oxygen.   Review of Systems  Constitutional: Positive for fatigue.  Respiratory: Positive for shortness of breath.   Cardiovascular: Positive for leg swelling (Mild). Negative for chest pain.  Gastrointestinal: Negative for nausea, vomiting and abdominal pain.       Objective:   Physical Exam  Constitutional: She appears distressed (Mild tachypnea after walking).  Cardiovascular: Normal rate and regular rhythm.  Exam reveals no gallop and no friction rub.   No murmur heard.      1+ bilateral ankle edema  Pulmonary/Chest: Effort normal. She has rales (Few left basilar  rales).  Abdominal: Soft. Bowel sounds are normal. There is no tenderness.       Assessment & Plan:

## 2012-03-22 NOTE — Assessment & Plan Note (Signed)
Hemoglobin A1C  Date Value Range Status  01/24/2012 7.0* <5.7 % Final  11/24/2011 7.4   Final  08/25/2011 6.8   Final     Assessment:  Diabetes control: good control (HgbA1C at goal)  Progress toward A1C goal:  at goal  Comments: Patient has been taking less than her usual dose of Lantus insulin.  She has been out of of rapid acting insulin for a couple of days, which she was using for mealtime coverage; this was started when she was in the hospital and the skilled nursing facility.  She continues on the same dose of metformin 850 mg 3 times a day as before, but is now off of Januvia which was stopped during her hospitalization.  Plan:  Medications:  resume previous home dose of Lantus insulin 42 units daily; continue metformin 850 mg 3 times a day.    Home glucose monitoring:   Frequency: 2 times a day   Timing: before breakfast;before dinner  Instruction/counseling given: reminded to bring blood glucose meter & log to each visit and reminded to bring medications to each visit  Educational resources provided: brochure  Self management tools provided: home glucose logbook  Other plans: Will further adjust regimen based on her blood sugar measurements.

## 2012-03-22 NOTE — Assessment & Plan Note (Signed)
Assessment: Patient has worsening exertional dyspnea, and had desaturated to 75% on 2 L nasal cannula O2 upon arrival to clinic after ambulating.  In the clinic, her resting O2 saturations on 2 L improved to 94%.  On 3 L nasal cannula O2, her oxygen saturation with ambulation ranged from 87-92%.  On 4 L nasal cannula O2, she maintained O2 saturations above 90% with ambulation.  Patient reports that she is compliant with her medications.  Given her x-ray findings suggesting mild pulmonary edema, there may be a component of diastolic heart failure contributing to her symptoms.  Plan: I advised patient to increase her nasal cannula oxygen to 3-4 L per minute with ambulation in order to maintain her oxygen saturations above 90%, and to decrease back to 2 L per minute at rest.  I will also start furosemide 20 mg daily to treat for possible contribution of diastolic heart failure.

## 2012-03-22 NOTE — Assessment & Plan Note (Signed)
Assessment: This is being managed by patient's oncologist Dr. [name], who plans to begin treatment this month.  Plan: Follow up with Dr. [name] as scheduled.

## 2012-03-23 ENCOUNTER — Telehealth: Payer: Self-pay | Admitting: Oncology

## 2012-03-23 ENCOUNTER — Other Ambulatory Visit: Payer: Self-pay | Admitting: Oncology

## 2012-03-23 ENCOUNTER — Telehealth: Payer: Self-pay | Admitting: Internal Medicine

## 2012-03-23 ENCOUNTER — Other Ambulatory Visit: Payer: Self-pay | Admitting: *Deleted

## 2012-03-23 ENCOUNTER — Encounter (HOSPITAL_COMMUNITY): Payer: Self-pay

## 2012-03-23 ENCOUNTER — Ambulatory Visit (HOSPITAL_COMMUNITY)
Admission: RE | Admit: 2012-03-23 | Discharge: 2012-03-23 | Disposition: A | Discharge status: Home / Self Care | Payer: Medicare Other | Source: Ambulatory Visit | Attending: Oncology | Admitting: Oncology

## 2012-03-23 DIAGNOSIS — E1149 Type 2 diabetes mellitus with other diabetic neurological complication: Secondary | ICD-10-CM | POA: Insufficient documentation

## 2012-03-23 DIAGNOSIS — Z8673 Personal history of transient ischemic attack (TIA), and cerebral infarction without residual deficits: Secondary | ICD-10-CM | POA: Insufficient documentation

## 2012-03-23 DIAGNOSIS — E7800 Pure hypercholesterolemia, unspecified: Secondary | ICD-10-CM | POA: Insufficient documentation

## 2012-03-23 DIAGNOSIS — G4733 Obstructive sleep apnea (adult) (pediatric): Secondary | ICD-10-CM | POA: Insufficient documentation

## 2012-03-23 DIAGNOSIS — E1139 Type 2 diabetes mellitus with other diabetic ophthalmic complication: Secondary | ICD-10-CM | POA: Insufficient documentation

## 2012-03-23 DIAGNOSIS — Z79899 Other long term (current) drug therapy: Secondary | ICD-10-CM | POA: Insufficient documentation

## 2012-03-23 DIAGNOSIS — E11319 Type 2 diabetes mellitus with unspecified diabetic retinopathy without macular edema: Secondary | ICD-10-CM | POA: Insufficient documentation

## 2012-03-23 DIAGNOSIS — E1142 Type 2 diabetes mellitus with diabetic polyneuropathy: Secondary | ICD-10-CM | POA: Insufficient documentation

## 2012-03-23 DIAGNOSIS — J449 Chronic obstructive pulmonary disease, unspecified: Secondary | ICD-10-CM

## 2012-03-23 DIAGNOSIS — Z950 Presence of cardiac pacemaker: Secondary | ICD-10-CM | POA: Insufficient documentation

## 2012-03-23 DIAGNOSIS — C50919 Malignant neoplasm of unspecified site of unspecified female breast: Secondary | ICD-10-CM | POA: Insufficient documentation

## 2012-03-23 DIAGNOSIS — J4489 Other specified chronic obstructive pulmonary disease: Secondary | ICD-10-CM | POA: Insufficient documentation

## 2012-03-23 DIAGNOSIS — Z923 Personal history of irradiation: Secondary | ICD-10-CM | POA: Insufficient documentation

## 2012-03-23 DIAGNOSIS — I10 Essential (primary) hypertension: Secondary | ICD-10-CM | POA: Insufficient documentation

## 2012-03-23 LAB — BASIC METABOLIC PANEL
CO2: 28 mEq/L (ref 19–32)
Chloride: 104 mEq/L (ref 96–112)
Potassium: 5.6 mEq/L — ABNORMAL HIGH (ref 3.5–5.1)
Sodium: 141 mEq/L (ref 135–145)

## 2012-03-23 LAB — CBC

## 2012-03-23 LAB — GLUCOSE, CAPILLARY
Glucose-Capillary: 50 mg/dL — ABNORMAL LOW (ref 70–99)
Glucose-Capillary: 72 mg/dL (ref 70–99)

## 2012-03-23 MED ORDER — SODIUM CHLORIDE 0.9 % IV SOLN: INTRAVENOUS | Status: DC

## 2012-03-23 MED ORDER — MIDAZOLAM HCL 2 MG/2ML IJ SOLN
INTRAMUSCULAR | Status: AC | PRN
  Administered 2012-03-23: 1 mg via INTRAVENOUS

## 2012-03-23 MED ORDER — TRAMADOL HCL 50 MG PO TABS: 50.0000 mg | ORAL_TABLET | Freq: Four times a day (QID) | ORAL | Status: DC | PRN

## 2012-03-23 MED ORDER — FENTANYL CITRATE 0.05 MG/ML IJ SOLN
INTRAMUSCULAR | Status: AC | PRN
  Administered 2012-03-23: 50 ug via INTRAVENOUS
  Administered 2012-03-23: 100 ug via INTRAVENOUS

## 2012-03-23 MED ORDER — MIDAZOLAM HCL 2 MG/2ML IJ SOLN
INTRAMUSCULAR | Status: AC
  Filled 2012-03-23: qty 4

## 2012-03-23 MED ORDER — FENTANYL CITRATE 0.05 MG/ML IJ SOLN
INTRAMUSCULAR | Status: AC
  Filled 2012-03-23: qty 4

## 2012-03-23 MED ORDER — CEFAZOLIN SODIUM-DEXTROSE 2-3 GM-% IV SOLR
2.0000 g | INTRAVENOUS | Status: AC
  Administered 2012-03-23: 2 g via INTRAVENOUS
  Filled 2012-03-23: qty 50

## 2012-03-23 MED ORDER — HEPARIN SOD (PORK) LOCK FLUSH 100 UNIT/ML IV SOLN
500.0000 [IU] | Freq: Once | INTRAVENOUS | Status: AC
  Administered 2012-03-23: 500 [IU] via INTRAVENOUS

## 2012-03-23 NOTE — Procedures (Signed)
Placement of left IJ portacath.  Tip is at SVC/ RA junction.  Ready to use. No immediate complication.

## 2012-03-23 NOTE — Telephone Encounter (Signed)
I refilled the tramadol.  I discussed the other medication with patient yesterday, and explained that I did not want to refill it at this time given the potential for causing drowsiness.

## 2012-03-23 NOTE — Telephone Encounter (Signed)
Pt was also requesting the medication for headaches "starts with a f", states it is on the paper she gave to the doctor yesterday.  She can not remember the name.

## 2012-03-23 NOTE — Telephone Encounter (Signed)
LMTCB-need to let patient know we can see if possible to change DME due to insurance and length of time with current DME; need to know what services current DME provides for patient.

## 2012-03-23 NOTE — Telephone Encounter (Signed)
Pt was called and message left of Tramadol refill.

## 2012-03-23 NOTE — Telephone Encounter (Signed)
gve linda davis copy of the appt for her to precert the echo if needed for jan 2014

## 2012-03-23 NOTE — H&P (Signed)
Chief Complaint: "I'm here for a portacath" Referring Physician:Magrinat HPI: Melissa Villa is an 66 y.o. female with hx of breast cancer. She is referred for Post placement to get IV chemo. This is her third occurrence with breast CA. She has had radiation in the past. She has a pacemaker on the right side. PMHx and meds reviewed, on Aggrenox  200-25, held since yesterday.  Past Medical History:  Past Medical History  Diagnosis Date  . CHF (congestive heart failure)      2-D echo 02/19/2009 showed left ventricle cavity size normal; systolic function normal, estimated ejection fraction 55%; wall motion normal; no regional wall motion abnormalities; PA peak pressure 47mm Hg.  [name] COPD (chronic obstructive pulmonary disease)     Pulmonary function tests on 08/05/2010 showed mixed obstructive and restrictive lung disease with no significant response to bronchodilator, and decreased diffusion capacity that corrects to normal range when adjusted for the inhaled alveolar volume.  . Degenerative joint disease   . AV block, complete     S/P dual-chamber for symptomatic episodes of bradycardia and complete heart block.  . Pacemaker     S/P dual-chamber for symptomatic episodes of bradycardia and complete heart block.  . Adenomatous polyp of colon     Tubular adenomas X 2 found 06/1999; negative screening colonoscopy 02/13/2004 by Dr. [name].  . Hypercholesteremia   . Obstructive sleep apnea     Baseline diagnostic nocturnal polysomnogram on July 27, 2005 showed severe obstructive sleep apnea/hypopnea syndrome, with AHI 153.2 per hour.  Last nocturnal polysomnogram done for CPAP titration was 05/01/2009; CPAP was titrated to 13 CWP, AHI 1.8 per hour using a large Respironics FitLife Full Face Mask with heated humidifier.  . Peripheral autonomic neuropathy due to diabetes mellitus   . 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 [name]  . Constipation   . Hand  pain   . Palpitation   . Rib pain     right sided   . DM type 2 (diabetes mellitus, type 2)   . Skin rash   . Urinary incontinence   . Proliferative diabetic retinopathy(362.02)     S/P panretinal photocoagulation by Dr. [name]  . Vitreous hemorrhage     With tractional detachment, left eye; S/P posterior vitrectomy with membrane peel by Dr. [name] 04/06/2005  . Dyspnea on exertion     Cardiac cath 06/17/2005 by Dr. [name] showed LVEF 50-55%, 30% proximal LAD stenosis, small diagonals.   . Shingles rash 03/25/11  . 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 Followed by Dr. [name].  . Breast cancer, stage 3 102/13    right hx lumpectomy 1994, bx today=invasive ductal ca  . Allergy     thinitis  . Asthma   . Bronchitis     hx  . Cataract     b/l surgery  . DM hyperosmolarity type II   . Hypertension   . COPD (chronic obstructive pulmonary disease)   . Diastolic CHF   . TIA (transient ischemic attack)   . OSA (obstructive sleep apnea)   .  Chronic respiratory failure with hypoxia   . Anemia   . DJD (degenerative joint disease)   . AV block   . DM retinopathy   . DM neuropathies   . Carcinoid tumor of lung     Past Surgical History:  Past Surgical History  Procedure Date  . Mastectomy partial / lumpectomy w/ axillary lymphadenectomy 09-10-1990, 09-Sep-1993    S/P left lumpectomy and axillary lymph node dissection December 1992 for a T1 N0 medullary breast cancer, no lymph node involvement, treated with tamoxifen for 2 years; S/P right lumpectomy and axillary lymph node dissection August 1995 for a T1 N0 microinvasive breast cancer,  treated with Arimidex for 5 years.  Patient is followed by Dr. [name] at the cancer center.  . Lung lobectomy 06/13/2001    S/P right lower lobe lobectomy 06/13/2001 by Dr. [name]  . Pacemaker insertion 04/20/2001  . Mastectomy partial / lumpectomy   . Lobectomy   . Pacemaker insertion   . Cholecystectomy   . Tonsillectomy     Family History:  Family History  Problem Relation Age of Onset  . Breast cancer Mother 30    Deceased 09/10/02  . Diabetes type II Mother   . Heart attack Brother   . Cervical cancer Neg Hx   . Colon cancer Neg Hx   . Breast cancer Mother   . Diabetes Mellitus II Mother   . Hypertension Brother   . Coronary artery disease Brother   . Stroke Brother   . Diabetes type II Brother   . Hypertension Sister     Social History:  reports that she quit smoking about 10 years ago. Her smoking use included Cigarettes. She has a 15 pack-year smoking history. She does not have any smokeless tobacco history on file. She reports that she does not drink alcohol or use illicit drugs.  Allergies: No Known Allergies  Medications: albuterol (PROAIR HFA) 108 (90 BASE) MCG/ACT inhaler (Taking) 1 Inhaler 11 09/14/2011 Sig - Route: Inhale 2 puffs into the lungs 4 (four) times daily as needed for wheezing or shortness of breath. - Inhalation Number of times this order has been changed since signing: 1 Order [address] albuterol (PROVENTIL) (2.5 MG/3ML) 0.083% nebulizer solution (Taking) 75 mL 12 03/14/2012 03/14/2013 Sig - Route: Take 3 mLs (2.5 mg total) by nebulization every 6 (six) hours as needed for wheezing or shortness of breath. - Nebulization Class: Print Number of times this order has been changed since signing: 1 Order [address] amLODipine (NORVASC) 10 MG tablet (Taking) Sig - Route: Take 10 mg by mouth daily before breakfast. - Oral Class: Historical Med Number of times this order has been changed since signing: 1 Order [address] Bepotastine Besilate (BEPREVE) 1.5 %  SOLN (Taking) Sig - Route: Place 1 drop into both eyes 2 (two) times daily. - Both Eyes Class: Historical Med Number of times this order has been changed since signing: 2 Order [address] carvedilol (COREG) 12.5 MG tablet (Taking) 120 tablet 6 01/17/2012 Sig - Route: Take 2 tablets (25 mg total) by mouth 2 (two) times daily. - Oral Number of times this order has been changed since signing: 1 Order [address] cetirizine (ZYRTEC) 10 MG tablet (Taking) 30 tablet 9 02/16/2012 Sig: TAKE ONE (1) TABLET EACH DAY FOR SEASONAL ALLERGIES Number of times this order has been changed since signing: 1 Order [address] Dexlansoprazole (DEXILANT) 60 MG capsule (Taking) Sig - Route: Take 60 mg by mouth every morning. Take 15 minutes  before breakfast. - Oral Class: Historical Med Number of times this order has been changed since signing: 2 Order [address] dipyridamole-aspirin (AGGRENOX) 200-25 MG per 12 hr capsule (Taking) Sig - Route: Take 1 capsule by mouth 2 (two) times daily. - Oral Class: Historical Med Number of times this order has been changed since signing: 5 Order [address] Fluticasone-Salmeterol (ADVAIR DISKUS) 250-50 MCG/DOSE AEPB (Taking) 60 each 11 04/15/2011 Sig - Route: Inhale 1 puff into the lungs 2 (two) times daily. - Inhalation Number of times this order has been changed since signing: 1 Order [address] irbesartan (AVAPRO) 300 MG tablet (Taking) Sig - Route: Take 300 mg by mouth daily before breakfast. - Oral Class: Historical Med Number of times this order has been changed since signing: 1 Order [address] oxyCODONE (OXY IR/ROXICODONE) 5 MG immediate release tablet (Taking) 30 tablet 02/25/2012 Sig - Route: Take 1-2 tablets (5-10 mg total) by mouth every 6 (six) hours as needed. - Oral Class: No Print Number of times this order has been changed since signing: 1 Order [address] tiotropium (SPIRIVA) 18 MCG inhalation capsule (Taking) 30 capsule 11 08/16/2011 Sig - Route: Place 1 capsule (18 mcg total) into  inhaler and inhale daily. - Inhalation Number of times this order has been changed since signing: 1    Please HPI for pertinent positives, otherwise complete 10 system ROS negative.  Physical Exam: Temp:98.2 HR 58 BP  121/54 O2 91%   General Appearance:  Alert, cooperative, no distress, appears stated age, morbidly obese.  Head:  Normocephalic, without obvious abnormality, atraumatic  ENT: Unremarkable  Neck: Supple, symmetrical, trachea midline, no adenopathy, thyroid: not enlarged, symmetric, no tenderness/mass/nodules  Lungs:   Clear to auscultation bilaterally, no w/r/r, respirations unlabored without use of accessory muscles.  Chest Wall:  Rt ant pacemaker palpable  Heart:  Regular rate and rhythm, S1, S2 normal, no murmur, rub or gallop. Carotids 2+ without bruit.  Extremities: Extremities normal, atraumatic, no cyanosis or edema.  Neurologic: Normal affect, no gross deficits.   Results for orders placed in visit on 03/22/12 (from the past 48 hour(s))  GLUCOSE, CAPILLARY     Status: Abnormal   Collection Time   03/22/12  9:43 AM      Component Value Range Comment   Glucose-Capillary 120 (*) 70 - 99 mg/dL   COMPLETE METABOLIC PANEL WITH GFR     Status: Abnormal   Collection Time   03/22/12 12:15 PM      Component Value Range Comment   Sodium 142  135 - 145 mEq/L    Potassium 5.1  3.5 - 5.3 mEq/L    Chloride 106  96 - 112 mEq/L    CO2 25  19 - 32 mEq/L    Glucose, Bld 98  70 - 99 mg/dL    BUN 19  6 - 23 mg/dL    Creat 1.61  0.96 - 0.45 mg/dL    Total Bilirubin 0.2 (*) 0.3 - 1.2 mg/dL    Alkaline Phosphatase 87  39 - 117 U/L    AST 44 (*) 0 - 37 U/L    ALT 49 (*) 0 - 35 U/L    Total Protein 7.4  6.0 - 8.3 g/dL    Albumin 2.9 (*) 3.5 - 5.2 g/dL    Calcium 9.0  8.4 - 40.9 mg/dL    GFR, Est African American 68      GFR, Est Non African American 59 (*)    CBC WITH DIFFERENTIAL  Status: Abnormal   Collection Time   03/22/12 12:15 PM      Component Value Range Comment    WBC 8.4  4.0 - 10.5 K/uL White count confirmed by smear   RBC 4.26  3.87 - 5.11 MIL/uL    Hemoglobin 9.0 (*) 12.0 - 15.0 g/dL    HCT 65.7 (*) 84.6 - 46.0 %    MCV 74.4 (*) 78.0 - 100.0 fL    MCH 21.1 (*) 26.0 - 34.0 pg    MCHC 28.4 (*) 30.0 - 36.0 g/dL    RDW 96.2 (*) 95.2 - 15.5 %    Platelets 190  150 - 400 K/uL Platelet count confirmed on smear   Neutrophils Relative 70  43 - 77 %    Neutro Abs 5.9  1.7 - 7.7 K/uL    Lymphocytes Relative 21  12 - 46 %    Lymphs Abs 1.8  0.7 - 4.0 K/uL    Monocytes Relative 8  3 - 12 %    Monocytes Absolute 0.7  0.1 - 1.0 K/uL    Eosinophils Relative 1  0 - 5 %    Eosinophils Absolute 0.1  0.0 - 0.7 K/uL    Basophils Relative 0  0 - 1 %    Basophils Absolute 0.0  0.0 - 0.1 K/uL    Smear Review        PRO B NATRIURETIC PEPTIDE     Status: Abnormal   Collection Time   03/22/12 12:15 PM      Component Value Range Comment   Pro B Natriuretic peptide (BNP) 193.6 (*) <126 pg/mL    Dg Chest 2 View  03/22/2012  *RADIOLOGY REPORT*  Clinical Data: Shortness of breath restrictive lung disease  CHEST - 2 VIEW  Comparison: CT chest 01/27/2012 and chest radiograph 12/24/2011  Findings: Right chest wall dual lead pacer is present with leads projecting over the right atrium right ventricle.  Stable cardiomegaly.  There is a small right pleural effusion, and there is pleural fluid within the minor fissure.  Pulmonary vascularity is mildly prominent and there is diffuse mild bilateral interstitial prominence.  Bilateral axillary clips are noted.  No acute bony abnormalities appreciated.  IMPRESSION:  1.  Small right pleural effusion. 2. Suspect mild congestive heart failure pattern.   Original Report Authenticated By: Britta Mccreedy, M.D.     Assessment/Plan Breast cancer For port placement. Discussed procedure, including risks and complications with pt. Use of sedation Labs pending Consent signed in chart  Brayton El PA-C 03/23/2012, 1:54 PM

## 2012-03-24 ENCOUNTER — Other Ambulatory Visit (HOSPITAL_BASED_OUTPATIENT_CLINIC_OR_DEPARTMENT_OTHER): Payer: Medicare Other | Admitting: Lab

## 2012-03-24 ENCOUNTER — Other Ambulatory Visit: Payer: Self-pay | Admitting: *Deleted

## 2012-03-24 ENCOUNTER — Ambulatory Visit (HOSPITAL_BASED_OUTPATIENT_CLINIC_OR_DEPARTMENT_OTHER): Payer: Medicare Other

## 2012-03-24 VITALS — BP 126/64 | HR 88 | Temp 99.0°F | Resp 20

## 2012-03-24 DIAGNOSIS — C50919 Malignant neoplasm of unspecified site of unspecified female breast: Secondary | ICD-10-CM

## 2012-03-24 DIAGNOSIS — D381 Neoplasm of uncertain behavior of trachea, bronchus and lung: Secondary | ICD-10-CM

## 2012-03-24 DIAGNOSIS — Z5111 Encounter for antineoplastic chemotherapy: Secondary | ICD-10-CM

## 2012-03-24 DIAGNOSIS — Z5112 Encounter for antineoplastic immunotherapy: Secondary | ICD-10-CM

## 2012-03-24 LAB — CBC WITH DIFFERENTIAL/PLATELET
Basophils Absolute: 0 10*3/uL (ref 0.0–0.1)
EOS%: 0.4 % (ref 0.0–7.0)
HGB: 8.7 g/dL — ABNORMAL LOW (ref 11.6–15.9)
MCH: 20.8 pg — ABNORMAL LOW (ref 25.1–34.0)
MONO#: 1.1 10*3/uL — ABNORMAL HIGH (ref 0.1–0.9)
NEUT#: 7.4 10*3/uL — ABNORMAL HIGH (ref 1.5–6.5)
RDW: 22.4 % — ABNORMAL HIGH (ref 11.2–14.5)
WBC: 10.2 10*3/uL (ref 3.9–10.3)
lymph#: 1.6 10*3/uL (ref 0.9–3.3)

## 2012-03-24 LAB — COMPREHENSIVE METABOLIC PANEL (CC13)
ALT: 27 U/L (ref 0–55)
Alkaline Phosphatase: 84 U/L (ref 40–150)
CO2: 27 mEq/L (ref 22–29)
Potassium: 5.2 mEq/L — ABNORMAL HIGH (ref 3.5–5.1)
Sodium: 143 mEq/L (ref 136–145)
Total Bilirubin: 0.21 mg/dL (ref 0.20–1.20)
Total Protein: 6.7 g/dL (ref 6.4–8.3)

## 2012-03-24 MED ORDER — ACETAMINOPHEN 325 MG PO TABS
650.0000 mg | ORAL_TABLET | Freq: Once | ORAL | Status: AC
  Administered 2012-03-24: 650 mg via ORAL

## 2012-03-24 MED ORDER — OXYCODONE HCL 5 MG PO TABS: 5.0000 mg | ORAL_TABLET | Freq: Four times a day (QID) | ORAL | Status: DC | PRN

## 2012-03-24 MED ORDER — SODIUM CHLORIDE 0.9 % IJ SOLN
10.0000 mL | INTRAMUSCULAR | Status: DC | PRN
  Administered 2012-03-24: 10 mL
  Filled 2012-03-24: qty 10

## 2012-03-24 MED ORDER — HEPARIN SOD (PORK) LOCK FLUSH 100 UNIT/ML IV SOLN
500.0000 [IU] | Freq: Once | INTRAVENOUS | Status: AC | PRN
  Administered 2012-03-24: 500 [IU]
  Filled 2012-03-24: qty 5

## 2012-03-24 MED ORDER — TRASTUZUMAB CHEMO INJECTION 440 MG
4.0000 mg/kg | Freq: Once | INTRAVENOUS | Status: AC
  Administered 2012-03-24: 504 mg via INTRAVENOUS
  Filled 2012-03-24: qty 24

## 2012-03-24 MED ORDER — DIPHENHYDRAMINE HCL 25 MG PO CAPS
50.0000 mg | ORAL_CAPSULE | Freq: Once | ORAL | Status: AC
  Administered 2012-03-24: 50 mg via ORAL

## 2012-03-24 MED ORDER — INSULIN GLARGINE 100 UNIT/ML ~~LOC~~ SOLN: 42.0000 [IU] | Freq: Every day | SUBCUTANEOUS | Status: DC

## 2012-03-24 MED ORDER — FULVESTRANT 250 MG/5ML IM SOLN
500.0000 mg | INTRAMUSCULAR | Status: DC
  Administered 2012-03-24: 500 mg via INTRAMUSCULAR
  Filled 2012-03-24: qty 10

## 2012-03-24 MED ORDER — SODIUM CHLORIDE 0.9 % IV SOLN
Freq: Once | INTRAVENOUS | Status: AC
  Administered 2012-03-24: 12:00:00 via INTRAVENOUS

## 2012-03-24 NOTE — Telephone Encounter (Signed)
Pt returning call

## 2012-03-24 NOTE — Telephone Encounter (Signed)
Pt called - no answer; left message rx ready to be pick up and to bring ID.

## 2012-03-24 NOTE — Telephone Encounter (Signed)
We can place order and see if patient is able to have that switch per her insurance. PCC's will need to check on this.

## 2012-03-24 NOTE — Telephone Encounter (Signed)
Per Almyra Free she will need an order for this and we will see if the change can be made. Pt aware.

## 2012-03-24 NOTE — Telephone Encounter (Signed)
Pt states Tramadol is not strong enough. Also requesting Lantus vials be changed to Lantus [employer]

## 2012-03-24 NOTE — Telephone Encounter (Signed)
Oxycodone refill for #60 tablets printed and signed - nurse to complete.  I changed Lantus to Lantus Solostar and refilled.  Patients pain regimen should be evaluated at next clinic visit.

## 2012-03-24 NOTE — Patient Instructions (Addendum)
[facility] Discharge Instructions for Patients Receiving Chemotherapy  Today you received the following chemotherapy agents Herceptin and Faslodex injection  To help prevent nausea and vomiting after your treatment, we encourage you to take your nausea medication  As per Dr. [name]  If you develop nausea and vomiting that is not controlled by your nausea medication, call the clinic. If it is after clinic hours your family physician or the after hours number for the clinic or go to the Emergency Department.   BELOW ARE SYMPTOMS THAT SHOULD BE REPORTED IMMEDIATELY:  *FEVER GREATER THAN 100.5 F  *CHILLS WITH OR WITHOUT FEVER  NAUSEA AND VOMITING THAT IS NOT CONTROLLED WITH YOUR NAUSEA MEDICATION  *UNUSUAL SHORTNESS OF BREATH  *UNUSUAL BRUISING OR BLEEDING  TENDERNESS IN MOUTH AND THROAT WITH OR WITHOUT PRESENCE OF ULCERS  *URINARY PROBLEMS  *BOWEL PROBLEMS  UNUSUAL RASH Items with * indicate a potential emergency and should be followed up as soon as possible.   Feel free to call the clinic you have any questions or concerns. The clinic phone number is [phone].   Fulvestrant injection What is this medicine? FULVESTRANT (ful VES trant) blocks the effects of estrogen. It is used to treat breast cancer in women past the age of menopause. This medicine may be used for other purposes; ask your health care provider or pharmacist if you have questions. What should I tell my health care provider before I take this medicine? They need to know if you have any of these conditions: -bleeding problems -liver disease -low levels of platelets in the blood -an unusual or allergic reaction to fulvestrant, other medicines, foods, dyes, or preservatives -pregnant or trying to get pregnant -breast-feeding How should I use this medicine? This medicine is for injection into a muscle. It is usually given by a health care professional in a hospital or clinic  setting. Talk to your pediatrician regarding the use of this medicine in children. Special care may be needed. Overdosage: If you think you have taken too much of this medicine contact a poison control center or emergency room at once. NOTE: This medicine is only for you. Do not share this medicine with others. What if I miss a dose? It is important not to miss your dose. Call your doctor or health care professional if you are unable to keep an appointment. What may interact with this medicine? -medicines that treat or prevent blood clots like warfarin, enoxaparin, and dalteparin This list may not describe all possible interactions. Give your health care provider a list of all the medicines, herbs, non-prescription drugs, or dietary supplements you use. Also tell them if you smoke, drink alcohol, or use illegal drugs. Some items may interact with your medicine. What should I watch for while using this medicine? Your condition will be monitored carefully while you are receiving this medicine. You will need important blood work done while you are taking this medicine. Do not become pregnant while taking this medicine. Women should inform their doctor if they wish to become pregnant or think they might be pregnant. There is a potential for serious side effects to an unborn child. Talk to your health care professional or pharmacist for more information. What side effects may I notice from receiving this medicine? Side effects that you should report to your doctor or health care professional as soon as possible: -allergic reactions like skin rash, itching or hives, swelling of the face, lips, or tongue -feeling faint or lightheaded, falls -fever  or flu-like symptoms -sore throat -vaginal bleeding Side effects that usually do not require medical attention (report to your doctor or health care professional if they continue or are bothersome): -aches, pains -constipation or diarrhea -headache -hot  flashes -nausea, vomiting -pain at site where injected -stomach pain This list may not describe all possible side effects. Call your doctor for medical advice about side effects. You may report side effects to FDA at 1-800-FDA-1088. Where should I keep my medicine? This drug is given in a hospital or clinic and will not be stored at home. NOTE: This sheet is a summary. It may not cover all possible information. If you have questions about this medicine, talk to your doctor, pharmacist, or health care provider.  2013, Elsevier/Gold Standard. (08/14/2007 3:39:24 PM)   Trastuzumab injection for infusion What is this medicine? TRASTUZUMAB (tras TOO zoo mab) is a monoclonal antibody. It targets a protein called HER2. This protein is found in some stomach and breast cancers. This medicine can stop cancer cell growth. This medicine may be used with other cancer treatments. This medicine may be used for other purposes; ask your health care provider or pharmacist if you have questions. What should I tell my health care provider before I take this medicine? They need to know if you have any of these conditions: -heart disease -heart failure -infection (especially a virus infection such as chickenpox, cold sores, or herpes) -lung or breathing disease, like asthma -recent or ongoing radiation therapy -an unusual or allergic reaction to trastuzumab, benzyl alcohol, or other medications, foods, dyes, or preservatives -pregnant or trying to get pregnant -breast-feeding How should I use this medicine? This drug is given as an infusion into a vein. It is administered in a hospital or clinic by a specially trained health care professional. Talk to your pediatrician regarding the use of this medicine in children. This medicine is not approved for use in children. Overdosage: If you think you have taken too much of this medicine contact a poison control center or emergency room at once. NOTE: This medicine  is only for you. Do not share this medicine with others. What if I miss a dose? It is important not to miss a dose. Call your doctor or health care professional if you are unable to keep an appointment. What may interact with this medicine? -cyclophosphamide -doxorubicin -warfarin This list may not describe all possible interactions. Give your health care provider a list of all the medicines, herbs, non-prescription drugs, or dietary supplements you use. Also tell them if you smoke, drink alcohol, or use illegal drugs. Some items may interact with your medicine. What should I watch for while using this medicine? Visit your doctor for checks on your progress. Report any side effects. Continue your course of treatment even though you feel ill unless your doctor tells you to stop. Call your doctor or health care professional for advice if you get a fever, chills or sore throat, or other symptoms of a cold or flu. Do not treat yourself. Try to avoid being around people who are sick. You may experience fever, chills and shaking during your first infusion. These effects are usually mild and can be treated with other medicines. Report any side effects during the infusion to your health care professional. Fever and chills usually do not happen with later infusions. What side effects may I notice from receiving this medicine? Side effects that you should report to your doctor or other health care professional as soon as possible: -  breathing difficulties -chest pain or palpitations -cough -dizziness or fainting -fever or chills, sore throat -skin rash, itching or hives -swelling of the legs or ankles -unusually weak or tired Side effects that usually do not require medical attention (report to your doctor or other health care professional if they continue or are bothersome): -loss of appetite -headache -muscle aches -nausea This list may not describe all possible side effects. Call your doctor for  medical advice about side effects. You may report side effects to FDA at 1-800-FDA-1088. Where should I keep my medicine? This drug is given in a hospital or clinic and will not be stored at home. NOTE: This sheet is a summary. It may not cover all possible information. If you have questions about this medicine, talk to your doctor, pharmacist, or health care provider.  2013, Elsevier/Gold Standard. (02/07/2009 1:43:15 PM)

## 2012-03-24 NOTE — Telephone Encounter (Signed)
LMTC

## 2012-03-24 NOTE — Telephone Encounter (Signed)
Pt gets oxygen and cpap through [facility] and has been with them for several years. Okay to send order to change over to [facility]? Pls advise.

## 2012-03-24 NOTE — Telephone Encounter (Signed)
Pt returned triage's call.  Pt states that her DME supplies her concentrater, CPAP & O2 needs.  Pt states that she has been with her current DME company for years.  Antionette Fairy

## 2012-03-25 LAB — CANCER ANTIGEN 27.29: CA 27.29: 64 U/mL — ABNORMAL HIGH (ref 0–39)

## 2012-03-27 ENCOUNTER — Telehealth: Payer: Self-pay | Admitting: *Deleted

## 2012-03-27 NOTE — Assessment & Plan Note (Signed)
Now at CPAP 11 with O2  2L/[employer] Good compliance and control.

## 2012-03-27 NOTE — Telephone Encounter (Signed)
Message copied by Augusto Garbe on Mon Mar 27, 2012 12:06 PM ------      Message from: Chip Boer      Created: Fri Mar 24, 2012  6:45 PM      Regarding: Chemo Follow Up Call      Contact: [phone]       First time Herceptin.  Dr. [name]

## 2012-03-27 NOTE — Telephone Encounter (Signed)
Called patient who says she is doing terribly.  Adds that she does not think it's due to treatment because she was experiencing this before treatment.  Reports feeling tired and not being able to make it from point A to point B,  Decreased appetite.  Not voiding as well and reports has a bm every morning.  Sleeping without any problems.  Wears CPAP but awakens feeling as if she hasn't slept.  Suggested she drink more water and try iron supplement.  Reports her family has taken a lot of time off work and need to work.  No further questions at this time.

## 2012-03-28 ENCOUNTER — Inpatient Hospital Stay (HOSPITAL_COMMUNITY)
Admission: EM | Admit: 2012-03-28 | Discharge: 2012-04-05 | DRG: 207 | Disposition: A | Discharge status: Home / Self Care | Payer: Medicare Other | Attending: Internal Medicine | Admitting: Internal Medicine

## 2012-03-28 ENCOUNTER — Encounter (HOSPITAL_COMMUNITY): Payer: Self-pay | Admitting: Emergency Medicine

## 2012-03-28 ENCOUNTER — Emergency Department (HOSPITAL_COMMUNITY): Payer: Medicare Other

## 2012-03-28 DIAGNOSIS — M79606 Pain in leg, unspecified: Secondary | ICD-10-CM

## 2012-03-28 DIAGNOSIS — G909 Disorder of the autonomic nervous system, unspecified: Secondary | ICD-10-CM | POA: Diagnosis present

## 2012-03-28 DIAGNOSIS — E861 Hypovolemia: Secondary | ICD-10-CM | POA: Diagnosis present

## 2012-03-28 DIAGNOSIS — J15212 Pneumonia due to Methicillin resistant Staphylococcus aureus: Secondary | ICD-10-CM | POA: Diagnosis present

## 2012-03-28 DIAGNOSIS — Z860100 Personal history of colon polyps, unspecified: Secondary | ICD-10-CM

## 2012-03-28 DIAGNOSIS — R05 Cough: Secondary | ICD-10-CM

## 2012-03-28 DIAGNOSIS — D899 Disorder involving the immune mechanism, unspecified: Secondary | ICD-10-CM | POA: Diagnosis present

## 2012-03-28 DIAGNOSIS — C50919 Malignant neoplasm of unspecified site of unspecified female breast: Secondary | ICD-10-CM

## 2012-03-28 DIAGNOSIS — Z6841 Body Mass Index (BMI) 40.0 and over, adult: Secondary | ICD-10-CM

## 2012-03-28 DIAGNOSIS — J984 Other disorders of lung: Secondary | ICD-10-CM

## 2012-03-28 DIAGNOSIS — E7800 Pure hypercholesterolemia, unspecified: Secondary | ICD-10-CM

## 2012-03-28 DIAGNOSIS — J449 Chronic obstructive pulmonary disease, unspecified: Secondary | ICD-10-CM | POA: Diagnosis present

## 2012-03-28 DIAGNOSIS — D72829 Elevated white blood cell count, unspecified: Secondary | ICD-10-CM | POA: Diagnosis present

## 2012-03-28 DIAGNOSIS — G4700 Insomnia, unspecified: Secondary | ICD-10-CM

## 2012-03-28 DIAGNOSIS — I5030 Unspecified diastolic (congestive) heart failure: Secondary | ICD-10-CM | POA: Diagnosis present

## 2012-03-28 DIAGNOSIS — J154 Pneumonia due to other streptococci: Secondary | ICD-10-CM | POA: Diagnosis present

## 2012-03-28 DIAGNOSIS — I5033 Acute on chronic diastolic (congestive) heart failure: Secondary | ICD-10-CM

## 2012-03-28 DIAGNOSIS — R079 Chest pain, unspecified: Secondary | ICD-10-CM

## 2012-03-28 DIAGNOSIS — D509 Iron deficiency anemia, unspecified: Secondary | ICD-10-CM

## 2012-03-28 DIAGNOSIS — Z950 Presence of cardiac pacemaker: Secondary | ICD-10-CM

## 2012-03-28 DIAGNOSIS — E1149 Type 2 diabetes mellitus with other diabetic neurological complication: Secondary | ICD-10-CM | POA: Diagnosis present

## 2012-03-28 DIAGNOSIS — J329 Chronic sinusitis, unspecified: Secondary | ICD-10-CM

## 2012-03-28 DIAGNOSIS — Z794 Long term (current) use of insulin: Secondary | ICD-10-CM

## 2012-03-28 DIAGNOSIS — R32 Unspecified urinary incontinence: Secondary | ICD-10-CM

## 2012-03-28 DIAGNOSIS — M67919 Unspecified disorder of synovium and tendon, unspecified shoulder: Secondary | ICD-10-CM

## 2012-03-28 DIAGNOSIS — N179 Acute kidney failure, unspecified: Secondary | ICD-10-CM

## 2012-03-28 DIAGNOSIS — M1990 Unspecified osteoarthritis, unspecified site: Secondary | ICD-10-CM

## 2012-03-28 DIAGNOSIS — M899 Disorder of bone, unspecified: Secondary | ICD-10-CM

## 2012-03-28 DIAGNOSIS — G9340 Encephalopathy, unspecified: Secondary | ICD-10-CM

## 2012-03-28 DIAGNOSIS — R4182 Altered mental status, unspecified: Secondary | ICD-10-CM

## 2012-03-28 DIAGNOSIS — Z7982 Long term (current) use of aspirin: Secondary | ICD-10-CM

## 2012-03-28 DIAGNOSIS — Z8673 Personal history of transient ischemic attack (TIA), and cerebral infarction without residual deficits: Secondary | ICD-10-CM

## 2012-03-28 DIAGNOSIS — I10 Essential (primary) hypertension: Secondary | ICD-10-CM

## 2012-03-28 DIAGNOSIS — I509 Heart failure, unspecified: Secondary | ICD-10-CM

## 2012-03-28 DIAGNOSIS — K219 Gastro-esophageal reflux disease without esophagitis: Secondary | ICD-10-CM

## 2012-03-28 DIAGNOSIS — I442 Atrioventricular block, complete: Secondary | ICD-10-CM

## 2012-03-28 DIAGNOSIS — Z79899 Other long term (current) drug therapy: Secondary | ICD-10-CM

## 2012-03-28 DIAGNOSIS — G609 Hereditary and idiopathic neuropathy, unspecified: Secondary | ICD-10-CM

## 2012-03-28 DIAGNOSIS — J9620 Acute and chronic respiratory failure, unspecified whether with hypoxia or hypercapnia: Secondary | ICD-10-CM

## 2012-03-28 DIAGNOSIS — I739 Peripheral vascular disease, unspecified: Secondary | ICD-10-CM

## 2012-03-28 DIAGNOSIS — J441 Chronic obstructive pulmonary disease with (acute) exacerbation: Secondary | ICD-10-CM

## 2012-03-28 DIAGNOSIS — R809 Proteinuria, unspecified: Secondary | ICD-10-CM

## 2012-03-28 DIAGNOSIS — M949 Disorder of cartilage, unspecified: Secondary | ICD-10-CM

## 2012-03-28 DIAGNOSIS — J810 Acute pulmonary edema: Secondary | ICD-10-CM

## 2012-03-28 DIAGNOSIS — E875 Hyperkalemia: Secondary | ICD-10-CM

## 2012-03-28 DIAGNOSIS — H4310 Vitreous hemorrhage, unspecified eye: Secondary | ICD-10-CM

## 2012-03-28 DIAGNOSIS — J9600 Acute respiratory failure, unspecified whether with hypoxia or hypercapnia: Principal | ICD-10-CM

## 2012-03-28 DIAGNOSIS — J4489 Other specified chronic obstructive pulmonary disease: Secondary | ICD-10-CM

## 2012-03-28 DIAGNOSIS — J301 Allergic rhinitis due to pollen: Secondary | ICD-10-CM

## 2012-03-28 DIAGNOSIS — R031 Nonspecific low blood-pressure reading: Secondary | ICD-10-CM | POA: Diagnosis present

## 2012-03-28 DIAGNOSIS — D381 Neoplasm of uncertain behavior of trachea, bronchus and lung: Secondary | ICD-10-CM

## 2012-03-28 DIAGNOSIS — R059 Cough, unspecified: Secondary | ICD-10-CM

## 2012-03-28 DIAGNOSIS — E662 Morbid (severe) obesity with alveolar hypoventilation: Secondary | ICD-10-CM | POA: Diagnosis present

## 2012-03-28 DIAGNOSIS — E119 Type 2 diabetes mellitus without complications: Secondary | ICD-10-CM

## 2012-03-28 DIAGNOSIS — Z87891 Personal history of nicotine dependence: Secondary | ICD-10-CM

## 2012-03-28 DIAGNOSIS — R0989 Other specified symptoms and signs involving the circulatory and respiratory systems: Secondary | ICD-10-CM

## 2012-03-28 DIAGNOSIS — G8922 Chronic post-thoracotomy pain: Secondary | ICD-10-CM

## 2012-03-28 DIAGNOSIS — G4733 Obstructive sleep apnea (adult) (pediatric): Secondary | ICD-10-CM

## 2012-03-28 DIAGNOSIS — E11359 Type 2 diabetes mellitus with proliferative diabetic retinopathy without macular edema: Secondary | ICD-10-CM

## 2012-03-28 LAB — CBC WITH DIFFERENTIAL/PLATELET
HCT: 31 % — ABNORMAL LOW (ref 36.0–46.0)
Hemoglobin: 8.5 g/dL — ABNORMAL LOW (ref 12.0–15.0)
Lymphocytes Relative: 13 % (ref 12–46)
MCHC: 27.4 g/dL — ABNORMAL LOW (ref 30.0–36.0)
MCV: 75.4 fL — ABNORMAL LOW (ref 78.0–100.0)
Monocytes Absolute: 1 10*3/uL (ref 0.1–1.0)
Monocytes Relative: 6 % (ref 3–12)
Neutro Abs: 13.5 10*3/uL — ABNORMAL HIGH (ref 1.7–7.7)
WBC: 16.8 10*3/uL — ABNORMAL HIGH (ref 4.0–10.5)

## 2012-03-28 LAB — BLOOD GAS, ARTERIAL
Acid-base deficit: 1.6 mmol/L (ref 0.0–2.0)
Bicarbonate: 25 mEq/L — ABNORMAL HIGH (ref 20.0–24.0)
FIO2: 0.6 %
MECHVT: 500 mL
TCO2: 24.5 mmol/L (ref 0–100)
pCO2 arterial: 59.1 mmHg (ref 35.0–45.0)
pO2, Arterial: 122 mmHg — ABNORMAL HIGH (ref 80.0–100.0)

## 2012-03-28 LAB — COMPREHENSIVE METABOLIC PANEL
ALT: 16 U/L (ref 0–35)
Alkaline Phosphatase: 67 U/L (ref 39–117)
CO2: 26 mEq/L (ref 19–32)
GFR calc Af Amer: 37 mL/min — ABNORMAL LOW (ref >90)
GFR calc non Af Amer: 32 mL/min — ABNORMAL LOW (ref >90)
Glucose, Bld: 245 mg/dL — ABNORMAL HIGH (ref 70–99)
Potassium: 6.9 mEq/L (ref 3.5–5.1)
Sodium: 138 mEq/L (ref 135–145)
Total Bilirubin: 0.2 mg/dL — ABNORMAL LOW (ref 0.3–1.2)

## 2012-03-28 LAB — URINALYSIS, ROUTINE W REFLEX MICROSCOPIC
Ketones, ur: NEGATIVE mg/dL
Nitrite: NEGATIVE
Urobilinogen, UA: 0.2 mg/dL (ref 0.0–1.0)
pH: 5 (ref 5.0–8.0)

## 2012-03-28 LAB — URINE MICROSCOPIC-ADD ON

## 2012-03-28 LAB — LACTIC ACID, PLASMA: Lactic Acid, Venous: 1 mmol/L (ref 0.5–2.2)

## 2012-03-28 MED ORDER — PIPERACILLIN-TAZOBACTAM 3.375 G IVPB
3.3750 g | Freq: Three times a day (TID) | INTRAVENOUS | Status: DC
  Administered 2012-03-29 – 2012-04-02 (×13): 3.375 g via INTRAVENOUS
  Filled 2012-03-28 (×14): qty 50

## 2012-03-28 MED ORDER — INSULIN REGULAR HUMAN 100 UNIT/ML IJ SOLN
5.0000 [IU] | Freq: Once | INTRAMUSCULAR | Status: AC
  Administered 2012-03-28: 5 [IU] via SUBCUTANEOUS
  Filled 2012-03-28: qty 0.05

## 2012-03-28 MED ORDER — LORAZEPAM 2 MG/ML IJ SOLN
2.0000 mg | INTRAMUSCULAR | Status: DC | PRN
  Administered 2012-03-28 – 2012-04-01 (×6): 2 mg via INTRAVENOUS
  Filled 2012-03-28 (×6): qty 1

## 2012-03-28 MED ORDER — PIPERACILLIN-TAZOBACTAM 3.375 G IVPB
3.3750 g | Freq: Once | INTRAVENOUS | Status: AC
  Administered 2012-03-29: 3.375 g via INTRAVENOUS
  Filled 2012-03-28: qty 50

## 2012-03-28 MED ORDER — SODIUM CHLORIDE 0.9 % IV BOLUS (SEPSIS)
1000.0000 mL | Freq: Once | INTRAVENOUS | Status: AC
  Administered 2012-03-28: 1000 mL via INTRAVENOUS

## 2012-03-28 MED ORDER — IPRATROPIUM-ALBUTEROL 18-103 MCG/ACT IN AERO: 6.0000 | INHALATION_SPRAY | RESPIRATORY_TRACT | Status: DC | PRN

## 2012-03-28 MED ORDER — PANTOPRAZOLE SODIUM 40 MG IV SOLR
40.0000 mg | INTRAVENOUS | Status: DC
  Administered 2012-03-29 – 2012-04-03 (×6): 40 mg via INTRAVENOUS
  Filled 2012-03-28 (×9): qty 40

## 2012-03-28 MED ORDER — SODIUM CHLORIDE 0.9 % IV SOLN
1.0000 g | Freq: Once | INTRAVENOUS | Status: AC
  Administered 2012-03-28: 1 g via INTRAVENOUS
  Filled 2012-03-28: qty 10

## 2012-03-28 MED ORDER — SODIUM BICARBONATE 4 % IV SOLN: 5.0000 mL | Freq: Once | INTRAVENOUS | Status: DC

## 2012-03-28 MED ORDER — SODIUM BICARBONATE 8.4 % IV SOLN
50.0000 meq | Freq: Once | INTRAVENOUS | Status: AC
  Administered 2012-03-28: 50 meq via INTRAVENOUS
  Filled 2012-03-28: qty 50

## 2012-03-28 MED ORDER — SODIUM CHLORIDE 0.9 % IV SOLN
INTRAVENOUS | Status: DC
  Administered 2012-03-29: 100 mL via INTRAVENOUS
  Administered 2012-03-29: 100 mL/h via INTRAVENOUS
  Administered 2012-03-30: 10:00:00 via INTRAVENOUS

## 2012-03-28 MED ORDER — MIDAZOLAM HCL 2 MG/2ML IJ SOLN
4.0000 mg | Freq: Once | INTRAMUSCULAR | Status: AC
  Administered 2012-03-28: 4 mg via INTRAVENOUS

## 2012-03-28 MED ORDER — FUROSEMIDE 10 MG/ML IJ SOLN
40.0000 mg | Freq: Once | INTRAMUSCULAR | Status: AC
  Administered 2012-03-28: 40 mg via INTRAVENOUS
  Filled 2012-03-28 (×2): qty 4

## 2012-03-28 MED ORDER — NALOXONE HCL 1 MG/ML IJ SOLN
1.0000 mg | Freq: Once | INTRAMUSCULAR | Status: AC
  Administered 2012-03-28: 1 mg via INTRAVENOUS
  Filled 2012-03-28: qty 2

## 2012-03-28 MED ORDER — BIOTENE DRY MOUTH MT LIQD
1.0000 "application " | Freq: Four times a day (QID) | OROMUCOSAL | Status: DC
  Administered 2012-03-29 – 2012-04-05 (×26): 15 mL via OROMUCOSAL

## 2012-03-28 MED ORDER — FENTANYL CITRATE 0.05 MG/ML IJ SOLN
100.0000 ug | INTRAMUSCULAR | Status: DC | PRN
  Administered 2012-03-31 – 2012-04-02 (×3): 100 ug via INTRAVENOUS
  Filled 2012-03-28 (×3): qty 2

## 2012-03-28 MED ORDER — HEPARIN SODIUM (PORCINE) 5000 UNIT/ML IJ SOLN
5000.0000 [IU] | Freq: Three times a day (TID) | INTRAMUSCULAR | Status: DC
  Administered 2012-03-29 – 2012-04-05 (×22): 5000 [IU] via SUBCUTANEOUS
  Filled 2012-03-28 (×26): qty 1

## 2012-03-28 MED ORDER — MIDAZOLAM HCL 2 MG/2ML IJ SOLN
INTRAMUSCULAR | Status: AC
  Administered 2012-03-28: 2 mg
  Filled 2012-03-28: qty 2

## 2012-03-28 MED ORDER — SODIUM CHLORIDE 0.9 % IV BOLUS (SEPSIS)
500.0000 mL | Freq: Once | INTRAVENOUS | Status: AC
  Administered 2012-03-28: 500 mL via INTRAVENOUS

## 2012-03-28 MED ORDER — NALOXONE HCL 1 MG/ML IJ SOLN
INTRAMUSCULAR | Status: AC
  Administered 2012-03-28: 1 mg
  Filled 2012-03-28: qty 2

## 2012-03-28 MED ORDER — INSULIN REGULAR HUMAN 100 UNIT/ML IJ SOLN
5.0000 [IU] | Freq: Once | INTRAMUSCULAR | Status: DC
  Filled 2012-03-28: qty 0.05

## 2012-03-28 MED ORDER — CHLORHEXIDINE GLUCONATE 0.12 % MT SOLN
15.0000 mL | Freq: Two times a day (BID) | OROMUCOSAL | Status: DC
  Administered 2012-03-29 – 2012-04-05 (×14): 15 mL via OROMUCOSAL
  Filled 2012-03-28 (×17): qty 15

## 2012-03-28 MED ORDER — IPRATROPIUM-ALBUTEROL 18-103 MCG/ACT IN AERO
6.0000 | INHALATION_SPRAY | RESPIRATORY_TRACT | Status: DC
  Administered 2012-03-29 – 2012-04-02 (×27): 6 via RESPIRATORY_TRACT
  Filled 2012-03-28: qty 14.7

## 2012-03-28 MED ORDER — MIDAZOLAM HCL 2 MG/2ML IJ SOLN
INTRAMUSCULAR | Status: AC
  Filled 2012-03-28: qty 4

## 2012-03-28 MED ORDER — DEXTROSE 50 % IV SOLN
1.0000 | Freq: Once | INTRAVENOUS | Status: AC
  Administered 2012-03-28: 50 mL via INTRAVENOUS
  Filled 2012-03-28: qty 50

## 2012-03-28 MED ORDER — VANCOMYCIN HCL 10 G IV SOLR
1750.0000 mg | INTRAVENOUS | Status: DC
  Administered 2012-03-29 – 2012-04-01 (×4): 1750 mg via INTRAVENOUS
  Filled 2012-03-28 (×5): qty 1750

## 2012-03-28 MED ORDER — VANCOMYCIN HCL 10 G IV SOLR
2500.0000 mg | Freq: Once | INTRAVENOUS | Status: AC
  Administered 2012-03-29: 2500 mg via INTRAVENOUS
  Filled 2012-03-28: qty 2500

## 2012-03-28 NOTE — H&P (Signed)
PULMONARY  / CRITICAL CARE MEDICINE  Name: Melissa Villa MRN: [id number] DOB: 20-Nov-1945    LOS: 0  REFERRING MD:  EDP  CHIEF COMPLAINT:  Unresponsive  BRIEF PATIENT DESCRIPTION: 66 yo with OSA/OHS, on chronic opioids brought to [facility] ED unresponsive, did not improve with Narcan, intubated for airway protection.  LINES / TUBES: 12/10  OETT 12/10  OGT 12/10  Foley Left chest Medi-Port  CULTURES: 12/10  Blood >>> 12/10  Respirator >>> 12/10  Urine >>>  ANTIBIOTICS: Zosyn 12/10 >>> Vanco 12/10 >>>  SIGNIFICANT EVENTS:  12/10  Brought to [facility] ED unresponsive, did not improve with Narcan, intubated for airway protection.  LEVEL OF CARE:  ICU  PRIMARY SERVICE:  PCCM  CONSULTANTS:  NA  CODE STATUS:  Full  DIET:  NPO  DVT Px:  Heparin  GI Px:  Protonix  The patient is encephalopathic and unable to provide history, which was obtained for available medical records.  HISTORY OF PRESENT ILLNESS:  66 yo with OSA/OHS, on chronic opioids brought to [facility] ED unresponsive, did not improve with Narcan, intubated for airway protection.  PAST MEDICAL HISTORY :  Past Medical History  Diagnosis Date  . CHF (congestive heart failure)      2-D echo 02/19/2009 showed left ventricle cavity size normal; systolic function normal, estimated ejection fraction 55%; wall motion normal; no regional wall motion abnormalities; PA peak pressure 47mm Hg.  [name] COPD (chronic obstructive pulmonary disease)     Pulmonary function tests on 08/05/2010 showed mixed obstructive and restrictive lung disease with no significant response to bronchodilator, and decreased diffusion capacity that corrects to normal range when adjusted for the inhaled alveolar volume.  . Degenerative joint disease   . AV block, complete     S/P dual-chamber for symptomatic episodes of bradycardia and complete heart block.  . Pacemaker     S/P dual-chamber for symptomatic episodes of bradycardia and complete heart block.  .  Adenomatous polyp of colon     Tubular adenomas X 2 found 06/1999; negative screening colonoscopy 02/13/2004 by Dr. [name].  . Hypercholesteremia   . Obstructive sleep apnea     Baseline diagnostic nocturnal polysomnogram on July 27, 2005 showed severe obstructive sleep apnea/hypopnea syndrome, with AHI 153.2 per hour.  Last nocturnal polysomnogram done for CPAP titration was 05/01/2009; CPAP was titrated to 13 CWP, AHI 1.8 per hour using a large Respironics FitLife Full Face Mask with heated humidifier.  . Peripheral autonomic neuropathy due to diabetes mellitus   . 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 [name]  . Constipation   . Hand pain   . Palpitation   . Rib pain     right sided   . DM type 2 (diabetes mellitus, type 2)   . Skin rash   . Urinary incontinence   . Proliferative diabetic retinopathy(362.02)     S/P panretinal photocoagulation by Dr. [name]  . Vitreous hemorrhage     With tractional detachment, left eye; S/P posterior vitrectomy with membrane peel by Dr. [name] 04/06/2005  . Dyspnea on exertion     Cardiac cath 06/17/2005 by Dr. [name] showed LVEF 50-55%, 30% proximal LAD stenosis, small diagonals.   . Shingles rash 03/25/11  . Breast cancer     S/P left lumpectomy and axillary node dissection 03/1991 for a T1 N0 medullary breast cancer, no lymph node involvement, treated with tamoxifen for 2 years; S/P right lumpectomy  and axillary lymph node dissection August 1995 for a T1 N0 microinvasive breast cancer, treated with Arimidex for 5 years.  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 Followed by Dr. [name].  . Breast cancer, stage 3 102/13    right hx lumpectomy 1994, bx today=invasive ductal ca  . Allergy     thinitis  . Asthma   . Bronchitis     hx  . Cataract     b/l surgery  . DM hyperosmolarity type II   . Hypertension   . COPD (chronic obstructive pulmonary disease)    . Diastolic CHF   . TIA (transient ischemic attack)   . OSA (obstructive sleep apnea)   . Chronic respiratory failure with hypoxia   . Anemia   . DJD (degenerative joint disease)   . AV block   . DM retinopathy   . DM neuropathies   . Carcinoid tumor of lung    Past Surgical History  Procedure Date  . 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  Patient is followed by Dr. [name] at the cancer center.  . Lung lobectomy 06/13/2001    S/P right lower lobe lobectomy 06/13/2001 by Dr. [name]  . Pacemaker insertion 04/20/2001  . Mastectomy partial / lumpectomy   . Lobectomy   . Pacemaker insertion   . Cholecystectomy   . Tonsillectomy    Prior to Admission medications   Medication Sig Start Date End Date Taking? Authorizing Provider  albuterol (PROAIR HFA) 108 (90 BASE) MCG/ACT inhaler Inhale 2 puffs into the lungs 4 (four) times daily as needed for wheezing or shortness of breath. 09/14/11  Yes [name], MD  albuterol (PROVENTIL) (2.5 MG/3ML) 0.083% nebulizer solution Take 3 mLs (2.5 mg total) by nebulization every 6 (six) hours as needed for wheezing or shortness of breath. 03/14/12 03/14/13 Yes Clinton D Young, MD  amLODipine (NORVASC) 10 MG tablet Take 10 mg by mouth daily before breakfast.   Yes Historical Provider, MD  Bepotastine Besilate (BEPREVE) 1.5 % SOLN Place 1 drop into both eyes 2 (two) times daily.   Yes Historical Provider, MD  calcium-vitamin D (OSCAL WITH D) 250-125 MG-UNIT per tablet Take 1 tablet by mouth daily.   Yes Historical Provider, MD  carvedilol (COREG) 12.5 MG tablet Take 2 tablets (25 mg total) by mouth 2  (two) times daily. 01/17/12  Yes [name], MD  cetirizine (ZYRTEC) 10 MG tablet TAKE ONE (1) TABLET EACH DAY FOR        SEASONAL ALLERGIES 02/16/12  Yes [name], MD  Dexlansoprazole (DEXILANT) 60 MG capsule Take 60 mg by mouth every morning. Take 15 minutes before breakfast.   Yes Historical Provider, MD  dipyridamole-aspirin (AGGRENOX) 200-25 MG per 12 hr capsule Take 1 capsule by mouth 2 (two) times daily.    Yes Historical Provider, MD  Fluticasone-Salmeterol (ADVAIR) 250-50 MCG/DOSE AEPB Inhale 1 puff into the lungs every 12 (twelve) hours. scheduled   Yes Historical Provider, MD  furosemide (LASIX) 20 MG tablet Take 1 tablet (20 mg total) by mouth daily. 03/22/12  Yes [name], MD  insulin glargine (LANTUS SOLOSTAR)  100 UNIT/ML injection Inject 42 Units into the skin daily. 03/24/12  Yes [name], MD  irbesartan (AVAPRO) 300 MG tablet Take 300 mg by mouth daily before breakfast.   Yes Historical Provider, MD  lidocaine-prilocaine (EMLA) cream Apply a thumbnail size to port site at least one prior to procedures. Cover with plastic wrap. 03/20/12  Yes [name], MD  metFORMIN (GLUCOPHAGE) 850 MG tablet Take 850 mg by mouth 3 (three) times daily.   Yes Historical Provider, MD  mometasone (NASONEX) 50 MCG/ACT nasal spray Place 2 sprays into the nose daily. scheduled   Yes Historical Provider, MD  Multiple Vitamin (MULTIVITAMIN WITH MINERALS) TABS Take 1 tablet by mouth daily.   Yes Historical Provider, MD  OVER THE COUNTER MEDICATION Take 1 tablet by mouth daily. Otc.Ferrous 45mg    Yes Historical Provider, MD  oxyCODONE (OXY IR/ROXICODONE) 5 MG immediate release tablet Take 1-2 tablets (5-10 mg total) by mouth every 6 (six) hours as needed for pain. 03/24/12  Yes [name], MD  pregabalin (LYRICA) 200 MG capsule Take 200 mg by mouth 2 (two) times daily. scheduled   Yes Historical Provider, MD  rosuvastatin (CRESTOR) 5 MG tablet Take 10 mg by mouth  daily.   Yes Historical Provider, MD  sitaGLIPtin (JANUVIA) 100 MG tablet Take 100 mg by mouth daily.   Yes Historical Provider, MD  tiotropium (SPIRIVA) 18 MCG inhalation capsule Place 1 capsule (18 mcg total) into inhaler and inhale daily. 08/16/11  Yes [name], MD  traMADol (ULTRAM) 50 MG tablet Take 1 tablet (50 mg total) by mouth every 6 (six) hours as needed for pain (cough). 03/23/12 03/23/13 Yes [name], MD  valsartan (DIOVAN) 80 MG tablet Take 160 mg by mouth 2 (two) times daily.   Yes Historical Provider, MD   No Known Allergies  FAMILY HISTORY:  Family History  Problem Relation Age of Onset  . Breast cancer Mother 10    Deceased Sep 24, 2002  . Diabetes type II Mother   . Heart attack Brother   . Cervical cancer Neg Hx   . Colon cancer Neg Hx   . Breast cancer Mother   . Diabetes Mellitus II Mother   . Hypertension Brother   . Coronary artery disease Brother   . Stroke Brother   . Diabetes type II Brother   . Hypertension Sister    SOCIAL HISTORY:  reports that she quit smoking about 10 years ago. Her smoking use included Cigarettes. She has a 15 pack-year smoking history. She does not have any smokeless tobacco history on file. She reports that she does not drink alcohol or use illicit drugs.  REVIEW OF SYSTEMS:  Unable to provide.  INTERVAL HISTORY:  VITAL SIGNS: Temp:  [96.9 F (36.1 C)-99 F (37.2 C)] 99 F (37.2 C) (12/10 09-23-28) Pulse Rate:  [60] 60  (12/10 09/23/2128) Resp:  [3-17] 3  (12/10 2128-09-23) BP: (94-117)/(38-62) 102/40 mmHg (12/10 2128-09-23) SpO2:  [100 %] 100 % (12/10 2128/09/23)  HEMODYNAMICS:    VENTILATOR SETTINGS:    INTAKE / OUTPUT: Intake/Output    None     PHYSICAL EXAMINATION: General:  Morbidly obesel, mechanically ventilated, synchronous Neuro:  Encephalopathic, nonfocal, cough / gag diminished HEENT:  PERRL, OETT / OGT Cardiovascular:  RRR, distant heart sounds, no m/r/g Lungs:  Bilateral diminished air entry, no w/r/r, surgical  absence of the R breast Abdomen:  Obese Musculoskeletal:  No edema Skin:  Intact  LABS:  Lab 03/28/12 1954 03/24/12  1117 03/24/12 1114 03/23/12 1357 03/22/12 1215  HGB 8.5* -- 8.7* SPECIMEN CLOTTED --  WBC 16.8* -- 10.2 SPECIMEN CLOTTED --  PLT 319 -- 214 SPECIMEN CLOTTED --  NA 138 143 -- 141 --  K 6.9* 5.2* -- -- --  CL 105 106 -- 104 --  CO2 26 27 -- 28 --  GLUCOSE 245* 143* -- 79 --  BUN 31* 16.0 -- 12 --  CREATININE 1.65* 1.2* -- 0.72 --  CALCIUM 8.5 9.4 -- 9.2 --  MG -- -- -- -- --  PHOS -- -- -- -- --  AST 37 22 -- -- 44*  ALT 16 27 -- -- 49*  ALKPHOS 67 84 -- -- 87  BILITOT 0.2* 0.21 -- -- 0.2*  PROT 7.4 6.7 -- -- 7.4  ALBUMIN 2.6* 2.7* -- -- 2.9*  APTT -- -- -- -- --  INR -- -- -- -- --  LATICACIDVEN -- -- -- -- --  TROPONINI <0.30 -- -- -- --  PROCALCITON -- -- -- -- --  PROBNP -- -- -- -- 193.6*  O2SATVEN -- -- -- -- --  PHART -- -- -- -- --  PCO2ART -- -- -- -- --  PO2ART -- -- -- -- --    Lab 03/23/12 1357 03/23/12 1354 03/22/12 0943  GLUCAP 72 50* 120*   IMAGING: 12/10  Head CT >>> nad, possible old infarct in the right pons 12/10  CXR >>> Low ETT, small right effusion  ECG:  12/20 >>> Paced rhythm on monitor  DIAGNOSES: Active Problems:  DIABETES MELLITUS, TYPE II  ANEMIA-IRON DEFICIENCY  OBSTRUCTIVE SLEEP APNEA  AV BLOCK, COMPLETE  CONGESTIVE HEART FAILURE  COPD  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  OSA.  OHS.  COPD without exacerbation. History of RLL lobectomy for carcinoid tumor. P:   Gaol SpO2>92, pH>7.30 Full mechanical support Daily SBT Trend ABG / CXR Combivent Hold Advair, Nasonex, Spiriva, Zyrtec  CARDIOVASCULAR  A: Transient hypotension secondary to sedation / positive pressure ventilation, less likely SIRS / sepsis.  CHF without  exacerbation. P:  Goal MAP>60 Lactate Troponin Hold Norvasc, Coreg, Aggrenox, Avapro, Crestor, Diovan  RENAL  A:  Hypovolemia.  AKI.  Hyperkalemia. P:   Trend BMP Given Ca, Insulin, D50 NS 1000 bolus NS@100  Hold Lasix  GASTROINTESTINAL  A:  Morbid obesity. P:   NPO as intubated TF if remains intubated > 24 hours  HEMATOLOGIC  A:  Chronic anemia, stable.  Breast CA. P:  Trend CBC  INFECTIOUS  A:  No clear source, but immunocompromised and hypotension / leukocytosis. P:   Cultures and antibiotics as above, but would d/c soon if work up negative PCT  ENDOCRINE   A:  DM.  Hyperglycemia. P:   ICU Glycemic Control Protocol  Phase 1 Hold Lantus, Metformin, Januvia  NEUROLOGIC  A:  Acute encephalopathy.  Chronic pain. P:   Goal RASS 0 to -1 Intermittent Fentanyl / Ativan PRN Hold Oxycodone  / Lyrica.  CLINICAL SUMMARY: 66 yo with OSA/OHS, on chronic opioids brought to [facility] ED unresponsive, did not improve with Narcan, intubated for airway protection.  AKI / dehydration / hyperkalemia - Ca/Insulin/D50, IVF.  Hypotensive / leukocytosis / immunocompromised (CA/chemo), no clear source of infection - sepsis work up, empirical abx.  I have personally obtained a history, examined the patient,  evaluated laboratory and imaging results, formulated the assessment and plan and placed orders.  CRITICAL CARE:  The patient is critically ill with multiple organ systems failure and requires high complexity decision making for assessment and support, frequent evaluation and titration of therapies, application of advanced monitoring technologies and extensive interpretation of multiple databases. Critical Care Time devoted to patient care services described in this note is 45 minutes.   Lonia Farber, MD  Pulmonary and Critical Care Medicine [facility] Pager: [phone]  03/28/2012, 10:06 PM

## 2012-03-28 NOTE — ED Notes (Signed)
Pt arrives via EMS, called for unresponsive, found pt resp 4/min, 2mg  Narcan given, pt w/o gag reflex, resp 8, intubated per EMS, 7mm ET tube, 25 CM @ lip, CBG 288, paced rhythm monitor, bagged per EMS, MD aware

## 2012-03-28 NOTE — Progress Notes (Signed)
ANTIBIOTIC CONSULT NOTE - INITIAL  Pharmacy Consult for Vancomycin/Zosyn Indication: rule out pneumonia  No Known Allergies  Patient Measurements: Height: 5\' 7"  (170.2 cm) IBW/kg (Calculated) : 61.6  Wt=133 kg  Vital Signs: Temp: 99.8 F (37.7 C) (12/10 2215) BP: 106/49 mmHg (12/10 2215) Pulse Rate: 60  (12/10 2215) Intake/Output from previous day:   Intake/Output from this shift:    Labs:  [facility] 03/28/12 1954  WBC 16.8*  HGB 8.5*  PLT 319  LABCREA --  CREATININE 1.65*   The CrCl is unknown because both a height and weight (above a minimum accepted value) are required for this calculation. No results found for this basename: VANCOTROUGH:2,VANCOPEAK:2,VANCORANDOM:2,GENTTROUGH:2,GENTPEAK:2,GENTRANDOM:2,TOBRATROUGH:2,TOBRAPEAK:2,TOBRARND:2,AMIKACINPEAK:2,AMIKACINTROU:2,AMIKACIN:2, in the last 72 hours   Microbiology: No results found for this or any previous visit (from the past 720 hour(s)).  Medical History: Past Medical History  Diagnosis Date  . CHF (congestive heart failure)      2-D echo 02/19/2009 showed left ventricle cavity size normal; systolic function normal, estimated ejection fraction 55%; wall motion normal; no regional wall motion abnormalities; PA peak pressure 47mm Hg.  [name] COPD (chronic obstructive pulmonary disease)     Pulmonary function tests on 08/05/2010 showed mixed obstructive and restrictive lung disease with no significant response to bronchodilator, and decreased diffusion capacity that corrects to normal range when adjusted for the inhaled alveolar volume.  . Degenerative joint disease   . AV block, complete     S/P dual-chamber for symptomatic episodes of bradycardia and complete heart block.  . Pacemaker     S/P dual-chamber for symptomatic episodes of bradycardia and complete heart block.  . Adenomatous polyp of colon     Tubular adenomas X 2 found 06/1999; negative screening colonoscopy 02/13/2004 by Dr. [name].  .  Hypercholesteremia   . Obstructive sleep apnea     Baseline diagnostic nocturnal polysomnogram on July 27, 2005 showed severe obstructive sleep apnea/hypopnea syndrome, with AHI 153.2 per hour.  Last nocturnal polysomnogram done for CPAP titration was 05/01/2009; CPAP was titrated to 13 CWP, AHI 1.8 per hour using a large Respironics FitLife Full Face Mask with heated humidifier.  . Peripheral autonomic neuropathy due to diabetes mellitus   . 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 [name]  . Constipation   . Hand pain   . Palpitation   . Rib pain     right sided   . DM type 2 (diabetes mellitus, type 2)   . Skin rash   . Urinary incontinence   . Proliferative diabetic retinopathy(362.02)     S/P panretinal photocoagulation by Dr. [name]  . Vitreous hemorrhage     With tractional detachment, left eye; S/P posterior vitrectomy with membrane peel by Dr. [name] 04/06/2005  . Dyspnea on exertion     Cardiac cath 06/17/2005 by Dr. [name] showed LVEF 50-55%, 30% proximal LAD stenosis, small diagonals.   . Shingles rash 03/25/11  . 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 Followed by Dr. [name].  . Breast cancer, stage 3 102/13    right hx lumpectomy 1994, bx today=invasive ductal ca  .  Allergy     thinitis  . Asthma   . Bronchitis     hx  . Cataract     b/l surgery  . DM hyperosmolarity type II   . Hypertension   . COPD (chronic obstructive pulmonary disease)   . Diastolic CHF   . TIA (transient ischemic attack)   . OSA (obstructive sleep apnea)   . Chronic respiratory failure with hypoxia    . Anemia   . DJD (degenerative joint disease)   . AV block   . DM retinopathy   . DM neuropathies   . Carcinoid tumor of lung     Medications:  Scheduled:    . albuterol-ipratropium  6 puff Inhalation Q4H  . antiseptic oral rinse  1 application Mouth Rinse QID  . chlorhexidine  15 mL Mouth/Throat BID  . [COMPLETED] dextrose  1 ampule Intravenous Once  . [COMPLETED] furosemide  40 mg Intravenous Once  . heparin subcutaneous  5,000 Units Subcutaneous Q8H  . [COMPLETED] insulin regular  5 Units Subcutaneous Once  . [COMPLETED] midazolam      . [COMPLETED] midazolam  4 mg Intravenous Once  . [COMPLETED] naloxone      . naLOXone (NARCAN)  injection  1 mg Intravenous Once  . pantoprazole (PROTONIX) IV  40 mg Intravenous Q24H  . [COMPLETED] sodium bicarbonate  50 mEq Intravenous Once  . [DISCONTINUED] insulin regular  5 Units Intravenous Once  . [DISCONTINUED] sodium bicarbonate  5 mL Intravenous Once   Infusions:    . sodium chloride    . calcium chloride  IV 1 g (03/28/12 2220)  . piperacillin-tazobactam (ZOSYN)  IV    . piperacillin-tazobactam (ZOSYN)  IV    . sodium chloride 1,000 mL (03/28/12 2221)  . [COMPLETED] sodium chloride Stopped (03/28/12 2119)  . vancomycin    . vancomycin     Assessment: 66 yo admitted with altered mental status with history of breast cancer, heart disease and morbid obesity.  MD ordering Vancomycin and Zosyn for suspected PNA.  Goal of Therapy:  Vancomycin trough level 15-20 mcg/ml  Plan:   Zosyn 3.375 Gm IV q8h.  EI infusion  Vancomcyin 2500mg  IV x1 then 1750mg  IV q24h.  CrCl~39 (N)  F/U SCr/levels as needed.  Susanne Greenhouse R 03/28/2012,10:34 PM

## 2012-03-28 NOTE — ED Notes (Signed)
Family brought back to pt room, Dr Adriana Simas to speak with family, updated on POC, pt tolerating interventions well, cont to monitor, Primary RN + 1 remains bedside

## 2012-03-28 NOTE — Progress Notes (Signed)
Pt intubated by EMT and has size 7.0 tube. Pt set on VT500  -Oxygen 60%- peep 5- rate 16. Tube was at 25 @ lip and was pulled out to 21@ the lip per physician due to chest x-Kameryn Davern.  Pt is stable at this time .

## 2012-03-28 NOTE — ED Provider Notes (Addendum)
History     CSN: [id number]  Arrival date & time 03/28/12  1933   First MD Initiated Contact with Patient 03/28/12 1943      Chief Complaint  Patient presents with  . Altered Mental Status    (Consider location/radiation/quality/duration/timing/severity/associated sxs/prior treatment) HPI.... level V caveat for altered mental status. Patient has a host of a serious terminal health problems including breast cancer, heart disease, morbid obesity.  She was apparently found in her bed unresponsive tonight. Her son reports that she has not felt well for the past 2-3 days.  EMS was called and the patient was intubated at the scene.   2 mg of Narcan were given IV which did not seem to help much.  In the emergency department, the patient was obtunded. Endotracheal tube is in place. Heart rhythm was normal. Blood pressure was stable. Another milligram of Narcan was given. Lab work shows a potassium greater than 6.    Past Medical History  Diagnosis Date  . CHF (congestive heart failure)      2-D echo 02/19/2009 showed left ventricle cavity size normal; systolic function normal, estimated ejection fraction 55%; wall motion normal; no regional wall motion abnormalities; PA peak pressure 47mm Hg.  [name] COPD (chronic obstructive pulmonary disease)     Pulmonary function tests on 08/05/2010 showed mixed obstructive and restrictive lung disease with no significant response to bronchodilator, and decreased diffusion capacity that corrects to normal range when adjusted for the inhaled alveolar volume.  . Degenerative joint disease   . AV block, complete     S/P dual-chamber for symptomatic episodes of bradycardia and complete heart block.  . Pacemaker     S/P dual-chamber for symptomatic episodes of bradycardia and complete heart block.  . Adenomatous polyp of colon     Tubular adenomas X 2 found 06/1999; negative screening colonoscopy 02/13/2004 by Dr. [name].  . Hypercholesteremia   .  Obstructive sleep apnea     Baseline diagnostic nocturnal polysomnogram on July 27, 2005 showed severe obstructive sleep apnea/hypopnea syndrome, with AHI 153.2 per hour.  Last nocturnal polysomnogram done for CPAP titration was 05/01/2009; CPAP was titrated to 13 CWP, AHI 1.8 per hour using a large Respironics FitLife Full Face Mask with heated humidifier.  . Peripheral autonomic neuropathy due to diabetes mellitus   . 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 [name]  . Constipation   . Hand pain   . Palpitation   . Rib pain     right sided   . DM type 2 (diabetes mellitus, type 2)   . Skin rash   . Urinary incontinence   . Proliferative diabetic retinopathy(362.02)     S/P panretinal photocoagulation by Dr. [name]  . Vitreous hemorrhage     With tractional detachment, left eye; S/P posterior vitrectomy with membrane peel by Dr. [name] 04/06/2005  . Dyspnea on exertion     Cardiac cath 06/17/2005 by Dr. [name] showed LVEF 50-55%, 30% proximal LAD stenosis, small diagonals.   . Shingles rash 03/25/11  . 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  Found to have invasive ductal carcinoma of the right breast in 10/  2011-09-20. Followed by Dr. [name].  . Breast cancer, stage 3 102/13    right hx lumpectomy 1994, bx today=invasive ductal ca  . Allergy     thinitis  . Asthma   . Bronchitis     hx  . Cataract     b/l surgery  . DM hyperosmolarity type II   . Hypertension   . COPD (chronic obstructive pulmonary disease)   . Diastolic CHF   . TIA (transient ischemic attack)   . OSA (obstructive sleep apnea)   . Chronic respiratory failure with hypoxia   . Anemia   . DJD  (degenerative joint disease)   . AV block   . DM retinopathy   . DM neuropathies   . Carcinoid tumor of lung     Past Surgical History  Procedure Date  . 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  Patient is followed by Dr. [name] at the cancer center.  . Lung lobectomy 06/13/2001    S/P right lower lobe lobectomy 06/13/2001 by Dr. [name]  . Pacemaker insertion 04/20/2001  . Mastectomy partial / lumpectomy   . Lobectomy   . Pacemaker insertion   . Cholecystectomy   . Tonsillectomy     Family History  Problem Relation Age of Onset  . Breast cancer Mother 57    Deceased 20-Sep-2002  . Diabetes type II Mother   . Heart attack Brother   . Cervical cancer Neg Hx   . Colon cancer Neg Hx   . Breast cancer Mother   . Diabetes Mellitus II Mother   . Hypertension Brother   . Coronary artery disease Brother   . Stroke Brother   . Diabetes type II Brother   . Hypertension Sister     History  Substance Use Topics  . Smoking status: Former Smoker -- 1.0 packs/day for 15 years    Types: Cigarettes    Quit date: 04/19/2001  . Smokeless tobacco: Not on file  . Alcohol Use: No    OB History    Grav Para Term Preterm Abortions TAB SAB Ect Mult Living                  Review of Systems  Unable to perform ROS: Intubated    Allergies  Review of patient's allergies indicates no known allergies.  Home Medications   Current Outpatient Rx  Name  Route  Sig  Dispense  Refill  . ALBUTEROL SULFATE HFA 108 (90 BASE) MCG/ACT IN AERS   Inhalation   Inhale 2 puffs into the lungs 4 (four) times daily as needed for wheezing or shortness of  breath.   1 Inhaler   11   . ALBUTEROL SULFATE (2.5 MG/3ML) 0.083% IN NEBU   Nebulization   Take 3 mLs (2.5 mg total) by nebulization every 6 (six) hours as needed for wheezing or shortness of breath.   75 mL   12   . AMLODIPINE BESYLATE 10 MG PO TABS   Oral   Take 10 mg by mouth daily before breakfast.         . BEPOTASTINE BESILATE 1.5 % OP SOLN   Both Eyes   Place  1 drop into both eyes 2 (two) times daily.         [name] CARVEDILOL 12.5 MG PO TABS   Oral   Take 2 tablets (25 mg total) by mouth 2 (two) times daily.   120 tablet   6   . CETIRIZINE HCL 10 MG PO TABS      TAKE ONE (1) TABLET EACH DAY FOR        SEASONAL ALLERGIES   30 tablet   9   . DEXLANSOPRAZOLE 60 MG PO CPDR   Oral   Take 60 mg by mouth every morning. Take 15 minutes before breakfast.         . ASPIRIN-DIPYRIDAMOLE ER 25-200 MG PO CP12   Oral   Take 1 capsule by mouth 2 (two) times daily.          [name] FLUTICASONE-SALMETEROL 250-50 MCG/DOSE IN AEPB   Inhalation   Inhale 1 puff into the lungs 2 (two) times daily.   60 each   11   . FUROSEMIDE 20 MG PO TABS   Oral   Take 1 tablet (20 mg total) by mouth daily.   30 tablet   1   . INSULIN GLARGINE 100 UNIT/ML [place] SOLN   Subcutaneous   Inject 42 Units into the skin daily.   15 mL   12   . IRBESARTAN 300 MG PO TABS   Oral   Take 300 mg by mouth daily before breakfast.         . LIDOCAINE-PRILOCAINE 2.5-2.5 % EX CREA      Apply a thumbnail size to port site at least one prior to procedures. Cover with plastic wrap.   30 g   1   . METFORMIN HCL 850 MG PO TABS   Oral   Take 850 mg by mouth 3 (three) times daily.         . OXYCODONE HCL 5 MG PO TABS   Oral   Take 1-2 tablets (5-10 mg total) by mouth every 6 (six) hours as needed for pain.   60 tablet   0   . PREGABALIN 100 MG PO CAPS   Oral   Take 200 mg by mouth 2 (two) times daily.         [name] TIOTROPIUM BROMIDE MONOHYDRATE 18 MCG IN CAPS   Inhalation   Place 1 capsule  (18 mcg total) into inhaler and inhale daily.   30 capsule   11   . TRAMADOL HCL 50 MG PO TABS   Oral   Take 1 tablet (50 mg total) by mouth every 6 (six) hours as needed for pain (cough).   60 tablet   2     Temp 96.9 F (36.1 C)  Resp 16  Physical Exam  Nursing note and vitals reviewed. Constitutional:       Obtunded, morbidly obese  HENT:  Head: Normocephalic and atraumatic.  Eyes: Conjunctivae normal are normal.       Pupils were unreactive  Neck: Neck supple.  Cardiovascular: Normal rate, regular rhythm and normal heart sounds.   Pulmonary/Chest: Effort normal and breath sounds normal.       Endotracheal tube in place  Abdominal: Soft. Bowel sounds are normal.       Protuberant abdomen  Musculoskeletal:       Unable  Neurological:       Unable  Skin: Skin is warm and dry.  Psychiatric:       Unable  ED Course  Procedures (including critical care time)  Labs Reviewed  CBC WITH DIFFERENTIAL - Abnormal; Notable for the following:    WBC 16.8 (*)     Hemoglobin 8.5 (*)     HCT 31.0 (*)     MCV 75.4 (*)     MCH 20.7 (*)     MCHC 27.4 (*)     RDW 22.9 (*)     Neutrophils Relative 80 (*)     Neutro Abs 13.5 (*)     All other components within normal limits  COMPREHENSIVE METABOLIC PANEL - Abnormal; Notable for the following:    Potassium 6.9 (*)     Glucose, Bld 245 (*)     BUN 31 (*)     Creatinine, Ser 1.65 (*)     Albumin 2.6 (*)     Total Bilirubin 0.2 (*)     GFR calc non Af Amer 32 (*)     GFR calc Af Amer 37 (*)     All other components within normal limits  TROPONIN I  URINALYSIS, ROUTINE W REFLEX MICROSCOPIC  CULTURE, BLOOD (ROUTINE X 2)  CULTURE, BLOOD (ROUTINE X 2)   Dg Chest Portable 1 View  03/28/2012  *RADIOLOGY REPORT*  Clinical Data: Altered mental status, ET tube placement  PORTABLE CHEST - 1 VIEW  Comparison: 03/22/2012  Findings: Endotracheal tube preferentially intubates the right mainstem bronchus.  Withdrawal approximately 4  cm is suggested.  Increased interstitial markings, possibly reflecting mild interstitial edema.   Layering small right pleural effusion.  No pneumothorax.  Cardiomegaly. Right subclavian pacemaker.  Left chest power port.  Surgical clips overlying the bilateral chest wall / axilla.  Enteric tube courses below the diaphragm.  IMPRESSION: Endotracheal tube preferentially intubates the right mainstem bronchus.  Withdrawal approximately 4 cm is suggested.  Mild cardiomegaly with possible mild interstitial edema.  Layering small right pleural effusion.   Original Report Authenticated By: Charline Bills, M.D.     Date: 03/28/2012  Rate: 60  Rhythm: paced rhythm  QRS Axis: normal  Intervals: normal  ST/T Wave abnormalities: normal  Conduction Disutrbances:none  Narrative Interpretation:   Old EKG Reviewed: changes noted  No diagnosis found.  CRITICAL CARE Performed by: Donnetta Hutching   Total critical care time: 45  Critical care time was exclusive of separately billable procedures and treating other patients.  Critical care was necessary to treat or prevent imminent or life-threatening deterioration.  Critical care was time spent personally by me on the following activities: development of treatment plan with patient and/or surrogate as well as nursing, discussions with consultants, evaluation of patient's response to treatment, examination of patient, obtaining history from patient or surrogate, ordering and performing treatments and interventions, ordering and review of laboratory studies, ordering and review of radiographic studies, pulse oximetry and re-evaluation of patient's condition.  MDM  Potassium is greater than 6.    Intravenous calcium, insulin, glucose, bicarb, Lasix given.   Endotracheal tube pulled back approximately 4 cm.  Discussed with critical care who will admit patient        Donnetta Hutching, MD 03/28/12 2130  Donnetta Hutching, MD 03/30/12 [phone]

## 2012-03-28 NOTE — ED Notes (Signed)
[id number]<JY> Expected date:<BR> Expected time:<BR> Means of arrival:<BR> Comments:<BR> EMS, Unresponsive

## 2012-03-29 ENCOUNTER — Encounter (HOSPITAL_COMMUNITY): Payer: Self-pay | Admitting: Nurse Practitioner

## 2012-03-29 ENCOUNTER — Inpatient Hospital Stay (HOSPITAL_COMMUNITY): Payer: Medicare Other

## 2012-03-29 DIAGNOSIS — J810 Acute pulmonary edema: Secondary | ICD-10-CM

## 2012-03-29 DIAGNOSIS — I5033 Acute on chronic diastolic (congestive) heart failure: Secondary | ICD-10-CM

## 2012-03-29 DIAGNOSIS — R4182 Altered mental status, unspecified: Secondary | ICD-10-CM

## 2012-03-29 DIAGNOSIS — I509 Heart failure, unspecified: Secondary | ICD-10-CM

## 2012-03-29 DIAGNOSIS — I319 Disease of pericardium, unspecified: Secondary | ICD-10-CM

## 2012-03-29 LAB — CBC
HCT: 27.8 % — ABNORMAL LOW (ref 36.0–46.0)
HCT: 28.3 % — ABNORMAL LOW (ref 36.0–46.0)
Hemoglobin: 7.6 g/dL — ABNORMAL LOW (ref 12.0–15.0)
Hemoglobin: 7.8 g/dL — ABNORMAL LOW (ref 12.0–15.0)
MCH: 20.5 pg — ABNORMAL LOW (ref 26.0–34.0)
MCHC: 27.3 g/dL — ABNORMAL LOW (ref 30.0–36.0)
MCV: 75.1 fL — ABNORMAL LOW (ref 78.0–100.0)
MCV: 75.1 fL — ABNORMAL LOW (ref 78.0–100.0)
RBC: 3.77 MIL/uL — ABNORMAL LOW (ref 3.87–5.11)
WBC: 10.2 10*3/uL (ref 4.0–10.5)

## 2012-03-29 LAB — GLUCOSE, CAPILLARY
Glucose-Capillary: 86 mg/dL (ref 70–99)
Glucose-Capillary: 81 mg/dL (ref 70–99)
Glucose-Capillary: 77 mg/dL (ref 70–99)
Glucose-Capillary: 99 mg/dL (ref 70–99)

## 2012-03-29 LAB — BASIC METABOLIC PANEL
BUN: 31 mg/dL — ABNORMAL HIGH (ref 6–23)
BUN: 31 mg/dL — ABNORMAL HIGH (ref 6–23)
CO2: 26 mEq/L (ref 19–32)
Calcium: 8.4 mg/dL (ref 8.4–10.5)
Chloride: 108 mEq/L (ref 96–112)
Creatinine, Ser: 1.63 mg/dL — ABNORMAL HIGH (ref 0.50–1.10)
Creatinine, Ser: 1.67 mg/dL — ABNORMAL HIGH (ref 0.50–1.10)
GFR calc non Af Amer: 30 mL/min — ABNORMAL LOW (ref >90)
GFR calc non Af Amer: 31 mL/min — ABNORMAL LOW (ref >90)
Glucose, Bld: 91 mg/dL (ref 70–99)
Potassium: 5.5 mEq/L — ABNORMAL HIGH (ref 3.5–5.1)
Sodium: 145 mEq/L (ref 135–145)

## 2012-03-29 LAB — PROCALCITONIN: Procalcitonin: 0.3 ng/mL

## 2012-03-29 LAB — CLOSTRIDIUM DIFFICILE BY PCR: Toxigenic C. Difficile by PCR: NEGATIVE

## 2012-03-29 MED ORDER — PRO-STAT SUGAR FREE PO LIQD
30.0000 mL | Freq: Every day | ORAL | Status: DC
  Administered 2012-03-29 – 2012-04-01 (×10): 30 mL via ORAL
  Filled 2012-03-29 (×26): qty 30

## 2012-03-29 MED ORDER — SODIUM POLYSTYRENE SULFONATE 15 GM/60ML PO SUSP
30.0000 g | Freq: Once | ORAL | Status: AC
  Administered 2012-03-29: 30 g
  Filled 2012-03-29: qty 120

## 2012-03-29 MED ORDER — BIOTENE DRY MOUTH MT LIQD: 15.0000 mL | Freq: Four times a day (QID) | OROMUCOSAL | Status: DC

## 2012-03-29 MED ORDER — CHLORHEXIDINE GLUCONATE 0.12 % MT SOLN
15.0000 mL | Freq: Two times a day (BID) | OROMUCOSAL | Status: DC
  Administered 2012-03-29: 15 mL via OROMUCOSAL

## 2012-03-29 MED ORDER — JEVITY 1.2 CAL PO LIQD
1000.0000 mL | ORAL | Status: DC
  Administered 2012-03-29: 1000 mL
  Administered 2012-03-30: 16:00:00

## 2012-03-29 NOTE — Progress Notes (Signed)
Elink: 3:01 AM @ 03/29/2012    Lab 03/29/12 0012 03/28/12 1954 03/24/12 1117 03/23/12 1357 03/22/12 1215  K 6.1* 6.9* 5.2* 5.6* 5.1    Lab 03/29/12 0012 03/28/12 1954 03/24/12 1117 03/23/12 1357 03/22/12 1215  CREATININE 1.63* 1.65* 1.2* 0.72 1.01    A Hyperkalemic  P Kayexalate 30gm via tube x 1  Dr. Kalman Shan, M.D., [facility].C.P Pulmonary and Critical Care Medicine Staff Physician [facility] Pager: [phone], If no answer or between  15:00h - 7:00h: call [phone]  03/29/2012 3:02 AM

## 2012-03-29 NOTE — Progress Notes (Signed)
INITIAL ADULT NUTRITION ASSESSMENT Date: 03/29/2012   Time: 2:53 PM Reason for Assessment: vent, consult for TF management  INTERVENTION: 1.  Enteral nutrition; initiate Jevity 1.2 @ 20 mL/hr continuous.  Advance by 10 mL q 4 hrs to 35 mL/hr goal. 2.  Supplements; Prostat 5 times daily  Total nutrition provided as prescribed: 1508 kcal, 121g protein   DOCUMENTATION CODES Per approved criteria  -Morbid Obesity    ASSESSMENT: Female 66 y.o.  Dx: unresponsive  Hx:  Past Medical History  Diagnosis Date  . CHF (congestive heart failure)      2-D echo 02/19/2009 showed left ventricle cavity size normal; systolic function normal, estimated ejection fraction 55%; wall motion normal; no regional wall motion abnormalities; PA peak pressure 47mm Hg.  [name] COPD (chronic obstructive pulmonary disease)     Pulmonary function tests on 08/05/2010 showed mixed obstructive and restrictive lung disease with no significant response to bronchodilator, and decreased diffusion capacity that corrects to normal range when adjusted for the inhaled alveolar volume.  . Degenerative joint disease   . AV block, complete     S/P dual-chamber for symptomatic episodes of bradycardia and complete heart block.  . Pacemaker     S/P dual-chamber for symptomatic episodes of bradycardia and complete heart block.  . Adenomatous polyp of colon     Tubular adenomas X 2 found 06/1999; negative screening colonoscopy 02/13/2004 by Dr. [name].  . Hypercholesteremia   . Obstructive sleep apnea     Baseline diagnostic nocturnal polysomnogram on July 27, 2005 showed severe obstructive sleep apnea/hypopnea syndrome, with AHI 153.2 per hour.  Last nocturnal polysomnogram done for CPAP titration was 05/01/2009; CPAP was titrated to 13 CWP, AHI 1.8 per hour using a large Respironics FitLife Full Face Mask with heated humidifier.  . Peripheral autonomic neuropathy due to diabetes mellitus   . Carcinoid tumor      S/P  right  lower lobe lobectomy 06/13/2001 by Dr. [name]  . Constipation   . Hand pain   . Palpitation   . Rib pain     right sided   . DM type 2 (diabetes mellitus, type 2)   . Skin rash   . Urinary incontinence   . Proliferative diabetic retinopathy(362.02)     S/P panretinal photocoagulation by Dr. [name]  . Vitreous hemorrhage     With tractional detachment, left eye; S/P posterior vitrectomy with membrane peel by Dr. [name] 04/06/2005  . Dyspnea on exertion     Cardiac cath 06/17/2005 by Dr. [name] showed LVEF 50-55%, 30% proximal LAD stenosis, small diagonals.   . Shingles rash 03/25/11  . 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 Followed by Dr. [name].  . Breast cancer, stage 3 102/13    right hx lumpectomy 1994, bx today=invasive ductal ca  . Allergy     thinitis  . Asthma   . Bronchitis     hx  . Cataract     b/l surgery  . DM hyperosmolarity type II   . Hypertension   . COPD (chronic obstructive pulmonary disease)   . Diastolic CHF   . TIA (transient  ischemic attack)   . OSA (obstructive sleep apnea)   . Chronic respiratory failure with hypoxia   . Anemia   . DJD (degenerative joint disease)   . AV block   . DM retinopathy   . DM neuropathies   . Carcinoid tumor of lung    Past Surgical History  Procedure Date  . Mastectomy partial / lumpectomy w/ axillary lymphadenectomy 1992, 1995    S/P left lumpectomy and axillary lymph node dissection December 1992 for a T1 N0 medullary breast cancer, no lymph node involvement, treated with tamoxifen for 2 years; S/P right lumpectomy and axillary lymph node dissection  August 1995 for a T1 N0 microinvasive breast cancer, treated with Arimidex for 5 years.  Patient is followed by Dr. [name] at the cancer center.  . Lung lobectomy 06/13/2001    S/P right lower lobe lobectomy 06/13/2001 by Dr. [name]  . Pacemaker insertion 04/20/2001  . Mastectomy partial / lumpectomy   . Lobectomy   . Pacemaker insertion   . Cholecystectomy   . Tonsillectomy     Related Meds:  Scheduled Meds:   . albuterol-ipratropium  6 puff Inhalation Q4H  . antiseptic oral rinse  1 application Mouth Rinse QID  . [COMPLETED] calcium chloride  IV  1 g Intravenous Once  . chlorhexidine  15 mL Mouth/Throat BID  . [COMPLETED] dextrose  1 ampule Intravenous Once  . [COMPLETED] furosemide  40 mg Intravenous Once  . heparin subcutaneous  5,000 Units Subcutaneous Q8H  . [COMPLETED] insulin regular  5 Units Subcutaneous Once  . [COMPLETED] midazolam      . [COMPLETED] midazolam  4 mg Intravenous Once  . [COMPLETED] naloxone      . [COMPLETED] naLOXone (NARCAN)  injection  1 mg Intravenous Once  . pantoprazole (PROTONIX) IV  40 mg Intravenous Q24H  . [COMPLETED] piperacillin-tazobactam (ZOSYN)  IV  3.375 g Intravenous Once  . piperacillin-tazobactam (ZOSYN)  IV  3.375 g Intravenous Q8H  . [COMPLETED] sodium bicarbonate  50 mEq Intravenous Once  . [COMPLETED] sodium chloride  1,000 mL Intravenous Once  . [COMPLETED] sodium chloride  500 mL Intravenous Once  . [COMPLETED] sodium polystyrene  30 g Per Tube Once  . vancomycin  1,750 mg Intravenous Q24H  . [COMPLETED] vancomycin  2,500 mg Intravenous Once  . [DISCONTINUED] antiseptic oral rinse  15 mL Mouth Rinse QID  . [DISCONTINUED] chlorhexidine  15 mL Mouth Rinse BID  . [DISCONTINUED] insulin regular  5 Units Intravenous Once  . [DISCONTINUED] sodium bicarbonate  5 mL Intravenous Once   Continuous Infusions:   . sodium chloride 100 mL/hr (03/29/12 1229)   PRN Meds:.albuterol-ipratropium, fentaNYL, LORazepam   Ht: 5\' 7"   (170.2 cm)  Wt: 291 lb 3.6 oz (132.1 kg)  Ideal Wt: 61.3 kg % Ideal Wt: 215%  Usual Wt: same per daughter-in-law % Usual Wt: 100%  Body mass index is 45.61 kg/(m^2).  Food/Nutrition Related Hx: pt with poor appetite and intake for 3 days PTA, was eating small amounts of fruit per family  Labs:  CMP     Component Value Date/Time   NA 141 03/29/2012 0530   NA 143 03/24/2012 1117   K 5.5* 03/29/2012 0530   K 5.2* 03/24/2012 1117   CL 108 03/29/2012 0530   CL 106 03/24/2012 1117   CO2 26 03/29/2012 0530   CO2 27 03/24/2012 1117   GLUCOSE 91 03/29/2012 0530   GLUCOSE 143* 03/24/2012 1117   BUN 31* 03/29/2012 0530   BUN  16.0 03/24/2012 1117   CREATININE 1.71* 03/29/2012 0530   CREATININE 1.2* 03/24/2012 1117   CREATININE 1.01 03/22/2012 1215   CALCIUM 8.8 03/29/2012 0530   CALCIUM 9.4 03/24/2012 1117   PROT 7.4 03/28/2012 1954   PROT 6.7 03/24/2012 1117   ALBUMIN 2.6* 03/28/2012 1954   ALBUMIN 2.7* 03/24/2012 1117   AST 37 03/28/2012 1954   AST 22 03/24/2012 1117   ALT 16 03/28/2012 1954   ALT 27 03/24/2012 1117   ALKPHOS 67 03/28/2012 1954   ALKPHOS 84 03/24/2012 1117   BILITOT 0.2* 03/28/2012 1954   BILITOT 0.21 03/24/2012 1117   GFRNONAA 30* 03/29/2012 0530   GFRAA 35* 03/29/2012 0530    CBC    Component Value Date/Time   WBC 9.8 03/29/2012 0530   WBC 10.2 03/24/2012 1114   RBC 3.70* 03/29/2012 0530   RBC 4.19 03/24/2012 1114   HGB 7.6* 03/29/2012 0530   HGB 8.7* 03/24/2012 1114   HCT 27.8* 03/29/2012 0530   HCT 30.7* 03/24/2012 1114   PLT 265 03/29/2012 0530   PLT 214 03/24/2012 1114   MCV 75.1* 03/29/2012 0530   MCV 73.3* 03/24/2012 1114   MCH 20.5* 03/29/2012 0530   MCH 20.8* 03/24/2012 1114   MCHC 27.3* 03/29/2012 0530   MCHC 28.3* 03/24/2012 1114   RDW 23.2* 03/29/2012 0530   RDW 22.4* 03/24/2012 1114   LYMPHSABS 2.2 03/28/2012 1954   LYMPHSABS 1.6 03/24/2012 1114   MONOABS 1.0 03/28/2012 1954   MONOABS 1.1* 03/24/2012 1114   EOSABS 0.1 03/28/2012 1954   EOSABS  0.0 03/24/2012 1114   BASOSABS 0.1 03/28/2012 1954   BASOSABS 0.0 03/24/2012 1114    Intake: NPO Output:   Intake/Output Summary (Last 24 hours) at 03/29/12 1455 Last data filed at 03/29/12 1400  Gross per 24 hour  Intake   1910 ml  Output    615 ml  Net   1295 ml   Last BM (12/11)  Diet Order: NPO  Supplements/Tube Feeding:  None at this time  IVF:    sodium chloride Last Rate: 100 mL/hr (03/29/12 1229)    Estimated Nutritional Needs:   Kcal: 1350-1530 Protein: 122-153g Fluid: >1.8 L/day  Pt admitted after episode of unresponsiveness.  Intubated for airway protection. Patient is currently intubated on ventilator support.  MV: 8.2 L/min Temp:Temp (24hrs), Avg:100.9 F (38.3 C), Min:96.9 F (36.1 C), Max:102.6 F (39.2 C)  Daughter in law at bedside states that pt has not been eating per her usual for about 3 days.  She states the pt would eat fruit, but nothing else.  She relates this to taste change as the pt has been c/o food "not tasting like anything." Pt has Stage 3 breast cancer currently being treated.  Enteral nutrition to provide 60-70% of estimated calorie needs (22-25 kcals/kg ideal body weight) and >/= 90% of estimated protein needs, based on ASPEN guidelines for permissive underfeeding in critically ill obese individuals.  NUTRITION DIAGNOSIS: -Inadequate oral intake (NI-2.1).  Status: Ongoing  RELATED TO: inability to eat  AS EVIDENCE BY: mechanical ventilation  MONITORING/EVALUATION(Goals): 1.  Enteral nutrition; initiation with tolerance.  EDUCATION NEEDS: -Education needs addressed with daughter in law   Loyce Dys, MS RD LDN Clinical Inpatient Dietitian Pager: [phone] Weekend/After hours pager: [phone]

## 2012-03-29 NOTE — Progress Notes (Signed)
[id number]/Blakleigh Straw, RN, BSN, CCM: CHART REVIEWED AND UPDATED.  Next chart review due on [id number]. NO DISCHARGE NEEDS PRESENT AT THIS TIME. CASE MANAGEMENT [phone] 

## 2012-03-29 NOTE — Progress Notes (Signed)
PULMONARY  / CRITICAL CARE MEDICINE  Name: JUNG INGERSON MRN: [id number] DOB: April 09, 1946    LOS: 1  REFERRING MD:  EDP  CHIEF COMPLAINT:  Unresponsive  BRIEF PATIENT DESCRIPTION: 66 yo with OSA/OHS, on chronic opioids brought to [facility] ED unresponsive, did not improve with Narcan, intubated for airway protection.  LINES / TUBES: 12/10  OETT>>> 12/10  OGT>>> 12/10  Foley>>> Left chest Medi-Port>>>  CULTURES: 12/10  Blood >>> 12/10  Sputum >>> 12/10  Urine >>>  ANTIBIOTICS: Zosyn 12/10 >>> Vanco 12/10 >>>  SIGNIFICANT EVENTS:  12/10  Brought to [facility] ED unresponsive, did not improve with Narcan, intubated for airway protection.  LEVEL OF CARE:  ICU  PRIMARY SERVICE:  PCCM  CONSULTANTS:  NA  CODE STATUS:  Full  DIET:  NPO  DVT Px:  Heparin  GI Px:  Protonix  The patient is encephalopathic and unable to provide history, which was obtained for available medical records.  VITAL SIGNS: Temp:  [96.9 F (36.1 C)-102.6 F (39.2 C)] 101.8 F (38.8 C) (12/11 0815) Pulse Rate:  [53-71] 60  (12/11 0815) Resp:  [3-21] 19  (12/11 0815) BP: (94-121)/(36-92) 121/49 mmHg (12/11 0800) SpO2:  [95 %-100 %] 97 % (12/11 0815) FiO2 (%):  [30 %-60 %] 30 % (12/11 0800) Weight:  [132.1 kg (291 lb 3.6 oz)] 132.1 kg (291 lb 3.6 oz) (12/10 2346)  HEMODYNAMICS:   VENTILATOR SETTINGS: Vent Mode:  [-] PRVC FiO2 (%):  [30 %-60 %] 30 % Set Rate:  [16 bmp] 16 bmp Vt Set:  [500 mL] 500 mL PEEP:  [5 cmH20] 5 cmH20 Plateau Pressure:  [19 cmH20-25 cmH20] 19 cmH20  INTAKE / OUTPUT: Intake/Output      12/10 0701 - 12/11 0700 12/11 0701 - 12/12 0700   I.V. (mL/kg) 600 (4.5) 100 (0.8)   IV Piggyback 550 50   Total Intake(mL/kg) 1150 (8.7) 150 (1.1)   Urine (mL/kg/hr) 425 (0.1) 40   Total Output 425 40   Net +725 +110        Stool Occurrence  1 x    PHYSICAL EXAMINATION: General:  Morbidly obese, mechanically ventilated, synchronous. Neuro:  Encephalopathic, nonfocal, cough / gag  diminished. HEENT:  PERRL, OETT / OGT. Cardiovascular:  RRR, distant heart sounds, no m/r/g. Lungs:  Bilateral diminished air entry, no w/r/r, surgical absence of the R breast. Abdomen:  Obese. Musculoskeletal:  No edema. Skin:  Intact.  LABS:  Lab 03/29/12 0530 03/29/12 0012 03/28/12 2254 03/28/12 2250 03/28/12 1954 03/24/12 1117 03/22/12 1215  HGB 7.6* 7.8* -- -- 8.5* -- --  WBC 9.8 10.2 -- -- 16.8* -- --  PLT 265 257 -- -- 319 -- --  NA 141 140 -- -- 138 -- --  K 5.5* 6.1* -- -- -- -- --  CL 108 108 -- -- 105 -- --  CO2 26 26 -- -- 26 -- --  GLUCOSE 91 183* -- -- 245* -- --  BUN 31* 31* -- -- 31* -- --  CREATININE 1.71* 1.63* -- -- 1.65* -- --  CALCIUM 8.8 8.8 -- -- 8.5 -- --  MG 2.3 -- -- -- -- -- --  PHOS 4.6 5.0* -- -- -- -- --  AST -- -- -- -- 37 22 44*  ALT -- -- -- -- 16 27 49*  ALKPHOS -- -- -- -- 67 84 87  BILITOT -- -- -- -- 0.2* 0.21 0.2*  PROT -- -- -- -- 7.4 6.7 7.4  ALBUMIN -- -- -- --  2.6* 2.7* 2.9*  APTT -- -- -- -- -- -- --  INR -- -- -- -- -- -- --  LATICACIDVEN -- -- -- 1.0 -- -- --  TROPONINI -- -- -- -- <0.30 -- --  PROCALCITON 0.82 -- -- 0.30 -- -- --  PROBNP -- -- -- -- -- -- 193.6*  O2SATVEN -- -- -- -- -- -- --  PHART -- -- 7.255* -- -- -- --  PCO2ART -- -- 59.1* -- -- -- --  PO2ART -- -- 122.0* -- -- -- --    Lab 03/29/12 0854 03/29/12 0334 03/29/12 0055 03/23/12 1357 03/23/12 1354  GLUCAP 86 106* 170* 72 50*   IMAGING: 12/10  Head CT >>> nad, 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  OSA.  OHS.  COPD without exacerbation. History of RLL lobectomy for carcinoid tumor.  VDRF due to CO2 narcosis and ?PNA and pulmonary edema. P:   - Goal SpO2>92, pH>7.30. - Full mechanical support. - Increase RR to 20 with F/U ABG. - PS trials in AM. - Combivent. - Hold Advair, Nasonex, Spiriva, Zyrtec. - CXR in AM. - Abx for ?PNA.  CARDIOVASCULAR  A: Transient hypotension secondary to sedation / positive pressure ventilation, less likely SIRS / sepsis.  CHF without exacerbation. P:  - Goal MAP>60 - Lactate 1.0. - Troponin <0.30. - Hold Norvasc, Coreg, Aggrenox, Avapro, Crestor, Diovan - 2D echo ordered.  RENAL  Intake/Output Summary (Last 24 hours) at 03/29/12 1019 Last data filed at 03/29/12 0900  Gross per 24 hour  Intake   1400 ml  Output    465 ml  Net    935 ml   A:  Hypovolemia.  AKI.  Hyperkalemia. P:   - Trend BMP. - Given Ca, Insulin, D50, K improving. - IVF 100 ml/hr. - Hold Lasix. - F/U BMET at 4 PM.  GASTROINTESTINAL  A:  Morbid obesity. P:   Start TF per nutrition consult.  HEMATOLOGIC  A:  Chronic anemia, stable.  Breast CA. P:  Trend CBC  INFECTIOUS  A:  No clear source, but immunocompromised and hypotension / leukocytosis. P:   Cultures and antibiotics as above, but would d/c soon if work up negative PCT  ENDOCRINE   A:  DM.  Hyperglycemia. P:   ICU Glycemic Control Protocol  Phase 1 Hold Lantus, Metformin, Januvia  NEUROLOGIC  A:  Acute encephalopathy.  Chronic pain. P:   Goal RASS 0 to -1 Intermittent Fentanyl / Ativan PRN Hold Oxycodone  / Lyrica.  CLINICAL SUMMARY: 66 yo with OSA/OHS, on chronic opioids brought to [facility] ED unresponsive, did not improve with Narcan, intubated for airway protection.  AKI / dehydration / hyperkalemia - Ca/Insulin/D50, IVF.  Hypotensive / leukocytosis / immunocompromised (CA/chemo), no clear source of infection - sepsis work up, empirical abx.  Spoke with son and daughter in law,  will continue aggressive care and make no CPR and no cardioversion.  Will meet with family again in AM to discuss plan of care.  I have personally  obtained a history, examined the patient, evaluated laboratory and imaging results, formulated the assessment and plan and placed orders.  CRITICAL CARE:  The patient is critically ill with multiple organ systems failure and requires high complexity decision making for assessment and support, frequent evaluation and titration of therapies, application of advanced monitoring technologies and extensive interpretation of multiple databases. Critical Care Time devoted to patient care services described in this note is 35 minutes.   Koren Bound, MD  Pulmonary and Critical Care Medicine [facility] Pager: [phone]  03/29/2012, 9:42 AM

## 2012-03-29 NOTE — Progress Notes (Signed)
  Echocardiogram 2D Echocardiogram has been performed.  Cathie Beams 03/29/2012, 1:37 PM

## 2012-03-29 NOTE — Progress Notes (Signed)
2 RT's present for exchange of ETT holder- uneventful. Current Sp02 96%, HR 91, RR 17.

## 2012-03-30 ENCOUNTER — Inpatient Hospital Stay (HOSPITAL_COMMUNITY): Payer: Medicare Other

## 2012-03-30 DIAGNOSIS — J9620 Acute and chronic respiratory failure, unspecified whether with hypoxia or hypercapnia: Secondary | ICD-10-CM

## 2012-03-30 LAB — CBC
HCT: 24.7 % — ABNORMAL LOW (ref 36.0–46.0)
MCH: 21.1 pg — ABNORMAL LOW (ref 26.0–34.0)
MCHC: 28.7 g/dL — ABNORMAL LOW (ref 30.0–36.0)
RDW: 22.9 % — ABNORMAL HIGH (ref 11.5–15.5)

## 2012-03-30 LAB — URINE CULTURE
Colony Count: NO GROWTH
Culture: NO GROWTH

## 2012-03-30 LAB — BLOOD GAS, ARTERIAL
Acid-base deficit: 0.8 mmol/L (ref 0.0–2.0)
Drawn by: 232811
FIO2: 0.4 %
MECHVT: 500 mL
PEEP: 5 cmH2O
RATE: 16 resp/min
pCO2 arterial: 44.1 mmHg (ref 35.0–45.0)
pH, Arterial: 7.357 (ref 7.350–7.450)
pO2, Arterial: 118 mmHg — ABNORMAL HIGH (ref 80.0–100.0)

## 2012-03-30 LAB — BASIC METABOLIC PANEL
BUN: 31 mg/dL — ABNORMAL HIGH (ref 6–23)
Calcium: 8.2 mg/dL — ABNORMAL LOW (ref 8.4–10.5)
GFR calc Af Amer: 47 mL/min — ABNORMAL LOW (ref >90)
GFR calc non Af Amer: 41 mL/min — ABNORMAL LOW (ref >90)
Potassium: 4.3 mEq/L (ref 3.5–5.1)

## 2012-03-30 LAB — GLUCOSE, CAPILLARY: Glucose-Capillary: 133 mg/dL — ABNORMAL HIGH (ref 70–99)

## 2012-03-30 LAB — PROCALCITONIN: Procalcitonin: 0.49 ng/mL

## 2012-03-30 MED ORDER — DIPHENOXYLATE-ATROPINE 2.5-0.025 MG/5ML PO LIQD
5.0000 mL | Freq: Once | ORAL | Status: AC
  Administered 2012-03-30: 5 mL
  Filled 2012-03-30: qty 5

## 2012-03-30 MED ORDER — FUROSEMIDE 10 MG/ML IJ SOLN
5.0000 mg/h | INTRAVENOUS | Status: AC
  Administered 2012-03-30: 5 mg/h via INTRAVENOUS
  Filled 2012-03-30: qty 25

## 2012-03-30 MED ORDER — ETOMIDATE 2 MG/ML IV SOLN
INTRAVENOUS | Status: AC
  Filled 2012-03-30: qty 20

## 2012-03-30 MED ORDER — SUCCINYLCHOLINE CHLORIDE 20 MG/ML IJ SOLN
INTRAMUSCULAR | Status: AC
  Filled 2012-03-30: qty 1

## 2012-03-30 MED ORDER — INSULIN ASPART 100 UNIT/ML ~~LOC~~ SOLN
2.0000 [IU] | SUBCUTANEOUS | Status: DC
  Administered 2012-03-30 – 2012-03-31 (×3): 6 [IU] via SUBCUTANEOUS

## 2012-03-30 MED ORDER — ROCURONIUM BROMIDE 50 MG/5ML IV SOLN
INTRAVENOUS | Status: AC
  Filled 2012-03-30: qty 2

## 2012-03-30 MED ORDER — FREE WATER
200.0000 mL | Freq: Three times a day (TID) | Status: DC
  Administered 2012-03-30 – 2012-04-01 (×6): 200 mL

## 2012-03-30 MED ORDER — METHYLPREDNISOLONE SODIUM SUCC 40 MG IJ SOLR
40.0000 mg | Freq: Three times a day (TID) | INTRAMUSCULAR | Status: DC
  Administered 2012-03-30 – 2012-04-03 (×12): 40 mg via INTRAVENOUS
  Filled 2012-03-30 (×15): qty 1

## 2012-03-30 MED ORDER — METOLAZONE 5 MG PO TABS
5.0000 mg | ORAL_TABLET | Freq: Every day | ORAL | Status: AC
  Filled 2012-03-30: qty 1

## 2012-03-30 MED ORDER — INSULIN ASPART 100 UNIT/ML ~~LOC~~ SOLN: 2.0000 [IU] | SUBCUTANEOUS | Status: DC | PRN

## 2012-03-30 MED ORDER — LIDOCAINE HCL (CARDIAC) 20 MG/ML IV SOLN
INTRAVENOUS | Status: AC
  Filled 2012-03-30: qty 5

## 2012-03-30 NOTE — Progress Notes (Signed)
eLink Physician-Brief Progress Note Patient Name: Melissa Villa DOB: May 14, 1945 MRN: [id number]  Date of Service  03/30/2012   HPI/Events of Note  Loose stools x 3 c diff neg   eICU Interventions  Lomotil x 1   Intervention Category Minor Interventions: Routine modifications to care plan (e.g. PRN medications for pain, fever)  Kesia Dalto V. 03/30/2012, 5:40 PM

## 2012-03-30 NOTE — Progress Notes (Signed)
Tried SBT, RR 38 with 16 cm PS.  Pt placed back on full support.

## 2012-03-30 NOTE — Progress Notes (Signed)
3 RT's present to advance ETT- was as 18cm lip and was advanced to 21cm lip- receiving VT- sp02 97%- bilateral BS- tube secure.

## 2012-03-30 NOTE — Clinical Documentation Improvement (Signed)
GENERIC DOCUMENTATION CLARIFICATION QUERY  THIS DOCUMENT IS NOT A PERMANENT PART OF THE MEDICAL RECORD  TO RESPOND TO THE THIS QUERY, FOLLOW THE INSTRUCTIONS BELOW:  1. If needed, update documentation for the patient's encounter via the notes activity.  2. Access this query again and click edit on the In [employer].  3. After updating, or not, click F2 to complete all highlighted (required) fields concerning your review. Select "additional documentation in the medical record" OR "no additional documentation provided".  4. Click Sign note button.  5. The deficiency will fall out of your In Basket *Please let us know if you are not able to complete this workflow by phone or e-mail (listed below).  Please update your documentation within the medical record to reflect your response to this query.                                                                                        03/30/12   Dear Dr. Marin Shutter, [facility],  In a better effort to capture your patient's severity of illness, reflect appropriate length of stay and utilization of resources, a review of the patient medical record has revealed the following indicators.    Based on your clinical judgment, please clarify and document in a progress note and/or discharge summary the clinical condition associated with the following supporting information:  In responding to this query please exercise your independent judgment.  The fact that a query is asked, does not imply that any particular answer is desired or expected.    Based on your clinical judgment can you provide a diagnosis that represents the below listed clinical indicators?  In this patient admitted with Respiratory Failure, acute encephalopathy, and DM 2 a review of the medical record reveals the following:   Pt obtunded per ED note  Pupils were un-reactive per ED  Note  Pt intubated for airway protection  Pt did not improve with Narcan per pn  03/29/12  Chronic opioid use per pn 03/29/12  Pt unresponsive per pn 03/29/12  Treatment:  Intubated  Narcan  advanced monitoring technologies and extensive interpretation of multiple databases  Clarification Needed   Based on the clinical indicators please clarify the underlying cause of pt's unresponsivenes and document in pn and d/c summary.   Possible Clinical Conditions?  Coma Coma non-traumatic Coma Diabetic   _______Other Condition__________________ _______Cannot Clinically Determine   Supporting Information:  Risk Factors: DM2, VDRF, Encephalopathy, hypotension, hypovolemic, ARF, hypokalemia, chronic anemia,   Signs & Symptoms: PN 12/11/13CLINICAL SUMMARY: 66 yo with OSA/OHS, on chronic opioids brought to [facility] ED unresponsive, did not improve with Narcan, intubated for airway protection.  AKI / dehydration / hyperkalemia - Ca/Insulin/D50, IVF.  Hypotensive / leukocytosis / immunocompromised (CA/chemo), no clear source of infection - sepsis work up, empirical abx.   BRIEF PATIENT DESCRIPTION: 66 yo with OSA/OHS, on chronic opioids brought to [facility] ED unresponsive, did not improve with Narcan, intubated for airway protection.  Diagnostics: Component     Latest Ref Rng 03/28/2012         7:54 PM  Glucose     70 - 99 mg/dL 161 (H)  Component     Latest Ref Rng 03/28/2012         7:54 PM  Glucose     70 - 99 mg/dL 161 (H)   Component     Latest Ref Rng 03/28/2012         7:54 PM  Neutrophils Relative     43 - 77 % 80 (H)   Component     Latest Ref Rng 03/28/2012         7:54 PM  Potassium     3.5 - 5.1 mEq/L 6.9 (HH)   Component     Latest Ref Rng 03/28/2012        10:54 PM  pH, Arterial     7.350 - 7.450 7.255 (L)  pCO2 arterial     35.0 - 45.0 mmHg 59.1 (HH)  pO2, Arterial     80.0 - 100.0 mmHg 122.0 (H)  Bicarbonate     20.0 - 24.0 mEq/L 25.0 (H)   Treatment Listed above Pt intubated by EMT and has size 7.0 tube per RT note  03/28/12  You may use possible, probable, or suspect with inpatient documentation. possible, probable, suspected diagnoses MUST be documented at the time of discharge  Reviewed:  no additional documentation provided ljh  Thank You,  Enis Slipper  RN, BSN, MSN/Inf, CCDS Clinical Documentation Specialist [facility] Pager: [phone] / E-mail: [email or web address]  Health Information Management [place]

## 2012-03-30 NOTE — Progress Notes (Signed)
PULMONARY  / CRITICAL CARE MEDICINE  Name: Melissa Villa MRN: [id number] DOB: 01-15-1946    LOS: 2  REFERRING MD:  EDP  CHIEF COMPLAINT:  Unresponsive  BRIEF PATIENT DESCRIPTION: 66 yo with OSA/OHS, on chronic opioids brought to [facility] ED unresponsive, did not improve with Narcan, intubated for airway protection.  LINES / TUBES: 12/10  OETT>>> 12/10  OGT>>> 12/10  Foley>>> Left chest Medi-Port>>>  CULTURES: 12/10  Blood >>> 12/10  Sputum >>>mod strep not a>> 12/10  Urine >>>  ANTIBIOTICS: Zosyn 12/10 >>> Vanco 12/10 >>>  SIGNIFICANT EVENTS:  12/10  Brought to [facility] ED unresponsive, did not improve with Narcan, intubated for airway protection.  LEVEL OF CARE:  ICU  PRIMARY SERVICE:  PCCM  CONSULTANTS:  NA  CODE STATUS:  Full  DIET:  NPO  DVT Px:  Heparin  GI Px:  Protonix    VITAL SIGNS: Temp:  [100.2 F (37.9 C)-100.9 F (38.3 C)] 100.4 F (38 C) (12/12 0800) Pulse Rate:  [87-97] 97  (12/12 0800) Resp:  [14-30] 16  (12/12 0800) BP: (99-143)/(46-81) 99/81 mmHg (12/12 0800) SpO2:  [91 %-99 %] 98 % (12/12 0800) FiO2 (%):  [30 %-40 %] 40 % (12/12 0800)  HEMODYNAMICS:   VENTILATOR SETTINGS: Vent Mode:  [-] PRVC FiO2 (%):  [30 %-40 %] 40 % Set Rate:  [16 bmp] 16 bmp Vt Set:  [500 mL] 500 mL PEEP:  [5 cmH20] 5 cmH20 Plateau Pressure:  [18 cmH20-26 cmH20] 26 cmH20  INTAKE / OUTPUT: Intake/Output      12/11 0701 - 12/12 0700 12/12 0701 - 12/13 0700   I.V. (mL/kg) 2400 (18.2) 100 (0.8)   Other 100    NG/GT 480 35   IV Piggyback 660    Total Intake(mL/kg) 3640 (27.6) 135 (1)   Urine (mL/kg/hr) 475 (0.1) 30   Total Output 475 30   Net +3165 +105        Stool Occurrence 3 x     PHYSICAL EXAMINATION: General:  Morbidly obese, mechanically ventilated, synchronous. Neuro:  Follows commands, alert , want ET tube out HEENT:  PERRL, OETT / OGT. Cardiovascular:  RRR, distant heart sounds, no m/r/g. Lungs:  Bilateral diminished air entry, no w/r/r, surgical  absence of the R breast. Abdomen:  Obese. Musculoskeletal:  No edema. Skin:  Intact.  LABS:  Lab 03/30/12 0518 03/30/12 0435 03/29/12 1535 03/29/12 0530 03/29/12 0012 03/28/12 2254 03/28/12 2250 03/28/12 1954 03/24/12 1117  HGB 7.1* -- -- 7.6* 7.8* -- -- -- --  WBC 7.9 -- -- 9.8 10.2 -- -- -- --  PLT 221 -- -- 265 257 -- -- -- --  NA 147* -- 145 141 -- -- -- -- --  K 4.3 -- 4.8 -- -- -- -- -- --  CL 115* -- 111 108 -- -- -- -- --  CO2 23 -- 24 26 -- -- -- -- --  GLUCOSE 137* -- 76 91 -- -- -- -- --  BUN 31* -- 31* 31* -- -- -- -- --  CREATININE 1.34* -- 1.67* 1.71* -- -- -- -- --  CALCIUM 8.2* -- 8.4 8.8 -- -- -- -- --  MG 2.3 -- -- 2.3 -- -- -- -- --  PHOS 3.4 -- -- 4.6 5.0* -- -- -- --  AST -- -- -- -- -- -- -- 37 22  ALT -- -- -- -- -- -- -- 16 27  ALKPHOS -- -- -- -- -- -- -- 67 84  BILITOT -- -- -- -- -- -- --  0.2* 0.21  PROT -- -- -- -- -- -- -- 7.4 6.7  ALBUMIN -- -- -- -- -- -- -- 2.6* 2.7*  APTT -- -- -- -- -- -- -- -- --  INR -- -- -- -- -- -- -- -- --  LATICACIDVEN -- -- -- -- -- -- 1.0 -- --  TROPONINI -- -- -- -- -- -- -- <0.30 --  PROCALCITON 0.49 -- -- 0.82 -- -- 0.30 -- --  PROBNP -- -- -- -- -- -- -- -- --  O2SATVEN -- -- -- -- -- -- -- -- --  PHART -- 7.357 -- -- -- 7.255* -- -- --  PCO2ART -- 44.1 -- -- -- 59.1* -- -- --  PO2ART -- 118.0* -- -- -- 122.0* -- -- --    Lab 03/30/12 0754 03/30/12 0003 03/29/12 1945 03/29/12 1620 03/29/12 1237  GLUCAP 140* 119* 99 77 81   IMAGING: 12/10  Head CT >>> nad, 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  OSA.  OHS.  COPD without exacerbation. History of RLL lobectomy for carcinoid tumor.  VDRF due to CO2 narcosis and ?PNA and pulmonary edema. P:   - Goal SpO2>92, pH>7.30. - Active diureses today via lasix drip. - PS trials to begin today. - Combivent. - Hold Advair, Nasonex, Spiriva, Zyrtec. - CXR in AM. - Add steroids. - Abx for ?PNA.  CARDIOVASCULAR  A: Transient hypotension secondary to sedation / positive pressure ventilation, less likely SIRS / sepsis.  CHF without exacerbation. P:  - Goal MAP>60. - Lactate 1.0. - Troponin <0.30. - Hold Norvasc, Coreg, Aggrenox, Avapro, Crestor, Diovan - 2D echo ordered. Ef 65%  RENAL  Intake/Output Summary (Last 24 hours) at 03/30/12 0915 Last data filed at 03/30/12 0800  Gross per 24 hour  Intake   3525 ml  Output    465 ml  Net   3060 ml   A:  Hypovolemia.  AKI.  Hyperkalemia.  Lab 03/30/12 0518 03/29/12 1535 03/29/12 0530  K 4.3 4.8 5.5*   P:   - Trend BMP. - Given Ca, Insulin, D50, K resolved 12-12 - KVO IVF. - Lasix drip. - NGT flushes. - F/U BMET as needed.  GASTROINTESTINAL  A:  Morbid obesity. P:   Continue TF.  HEMATOLOGIC  Basename 03/30/12 0518 03/29/12 0530  HGB 7.1* 7.6*   A:  Chronic anemia, stable.  Breast CA. P:  Trend CBC.  INFECTIOUS  A:  No clear source, but immunocompromised and hypotension / leukocytosis. P:   Cultures and antibiotics as above, but would d/c soon if work up negative PCT .49  ENDOCRINE  CBG (last 3)   Basename 03/30/12 0754 03/30/12 0003 03/29/12 1945  GLUCAP 140* 119* 99   A:  DM.  Hyperglycemia. P:   ICU Glycemic Control Protocol  Phase 1 Hold Lantus, Metformin, Januvia.  NEUROLOGIC  A:  Acute encephalopathy.  Chronic pain.Awake and follows commands. P:   Goal RASS 0 to -1 Intermittent Fentanyl / Ativan PRN Hold Oxycodone  / Lyrica.  CLINICAL SUMMARY: 66 yo with OSA/OHS, on chronic opioids  brought to [facility] ED unresponsive, did not improve with Narcan, intubated for airway protection.  AKI / dehydration / hyperkalemia - Ca/Insulin/D50, IVF.  Hypotensive / leukocytosis / immunocompromised (CA/chemo), no clear source of infection - sepsis work up, empirical abx.  Spoke with son and daughter in law, will continue aggressive care and make no CPR and no cardioversion.  Will meet with family again in 12-12 to discuss plan of care.  I have personally obtained a history, examined the patient, evaluated laboratory and imaging results, formulated the assessment and plan and placed orders.  CRITICAL CARE:  The patient is critically ill with multiple organ systems failure and requires high complexity decision making for assessment and support, frequent evaluation and titration of therapies, application of advanced monitoring technologies and extensive interpretation of multiple databases. Critical Care Time devoted to patient care services described in this note is 35 minutes.   Alyson Reedy, M.D. [facility] Pulmonary/Critical Care Medicine. Pager: [phone]. After hours pager: [phone].

## 2012-03-31 ENCOUNTER — Inpatient Hospital Stay (HOSPITAL_COMMUNITY): Payer: Medicare Other

## 2012-03-31 LAB — BASIC METABOLIC PANEL
BUN: 24 mg/dL — ABNORMAL HIGH (ref 6–23)
BUN: 24 mg/dL — ABNORMAL HIGH (ref 6–23)
CO2: 28 mEq/L (ref 19–32)
Chloride: 108 mEq/L (ref 96–112)
Creatinine, Ser: 1.07 mg/dL (ref 0.50–1.10)
GFR calc non Af Amer: 53 mL/min — ABNORMAL LOW (ref >90)
Glucose, Bld: 235 mg/dL — ABNORMAL HIGH (ref 70–99)
Potassium: 3.3 mEq/L — ABNORMAL LOW (ref 3.5–5.1)

## 2012-03-31 LAB — GLUCOSE, CAPILLARY
Glucose-Capillary: 259 mg/dL — ABNORMAL HIGH (ref 70–99)
Glucose-Capillary: 288 mg/dL — ABNORMAL HIGH (ref 70–99)
Glucose-Capillary: 313 mg/dL — ABNORMAL HIGH (ref 70–99)

## 2012-03-31 LAB — CBC
HCT: 27.3 % — ABNORMAL LOW (ref 36.0–46.0)
MCH: 20.6 pg — ABNORMAL LOW (ref 26.0–34.0)
MCV: 73.2 fL — ABNORMAL LOW (ref 78.0–100.0)
RBC: 3.73 MIL/uL — ABNORMAL LOW (ref 3.87–5.11)
RDW: 22.8 % — ABNORMAL HIGH (ref 11.5–15.5)
WBC: 8.8 10*3/uL (ref 4.0–10.5)

## 2012-03-31 LAB — BLOOD GAS, ARTERIAL
Drawn by: 336861
MECHVT: 500 mL
RATE: 16 resp/min
pCO2 arterial: 44.6 mmHg (ref 35.0–45.0)
pH, Arterial: 7.414 (ref 7.350–7.450)

## 2012-03-31 LAB — PHOSPHORUS: Phosphorus: 2.8 mg/dL (ref 2.3–4.6)

## 2012-03-31 MED ORDER — METOLAZONE 5 MG PO TABS
5.0000 mg | ORAL_TABLET | Freq: Every day | ORAL | Status: AC
  Administered 2012-03-31: 5 mg via ORAL
  Filled 2012-03-31: qty 1

## 2012-03-31 MED ORDER — ACETAMINOPHEN 325 MG PO TABS
650.0000 mg | ORAL_TABLET | Freq: Four times a day (QID) | ORAL | Status: DC | PRN
  Administered 2012-03-31: 650 mg via ORAL
  Filled 2012-03-31: qty 2

## 2012-03-31 MED ORDER — ACETAMINOPHEN 325 MG PO TABS
ORAL_TABLET | ORAL | Status: AC
  Filled 2012-03-31: qty 2

## 2012-03-31 MED ORDER — SODIUM CHLORIDE 0.9 % IV SOLN
INTRAVENOUS | Status: DC
  Administered 2012-03-31: 2.7 [IU]/h via INTRAVENOUS
  Administered 2012-04-01: 5.3 [IU]/h via INTRAVENOUS
  Administered 2012-04-01: 8.2 [IU]/h via INTRAVENOUS
  Administered 2012-04-01: 5.3 [IU]/h via INTRAVENOUS
  Filled 2012-03-31 (×3): qty 1

## 2012-03-31 MED ORDER — FUROSEMIDE 10 MG/ML IJ SOLN
10.0000 mg/h | INTRAVENOUS | Status: DC
  Administered 2012-03-31 – 2012-04-01 (×2): 10 mg/h via INTRAVENOUS
  Filled 2012-03-31 (×4): qty 25

## 2012-03-31 NOTE — Progress Notes (Signed)
PULMONARY  / CRITICAL CARE MEDICINE  Name: ARIKA MAINER MRN: [id number] DOB: Sep 22, 1945    LOS: 3  REFERRING MD:  EDP  CHIEF COMPLAINT:  Unresponsive  BRIEF PATIENT DESCRIPTION: 66 yo with OSA/OHS, on chronic opioids brought to [facility] ED unresponsive, did not improve with Narcan, intubated for airway protection.  LINES / TUBES: 12/10  OETT>>> 12/10  OGT>>> 12/10  Foley>>> Left chest Medi-Port>>>  CULTURES: 12/10  Blood >>>NTD 12/10  Sputum >>>Staph aureus (sens pending) 12/10  Urine >>>NTD  ANTIBIOTICS: Zosyn 12/10 >>> Vanco 12/10 >>>  SIGNIFICANT EVENTS:  12/10  Brought to [facility] ED unresponsive, did not improve with Narcan, intubated for airway protection.  LEVEL OF CARE:  ICU PRIMARY SERVICE:  PCCM CONSULTANTS:  NA CODE STATUS:  Full DIET:  TF DVT Px:  Heparin GI Px:  Protonix  VITAL SIGNS: Temp:  [99.3 F (37.4 C)-100 F (37.8 C)] 100 F (37.8 C) (12/13 0800) Pulse Rate:  [89-108] 102  (12/13 1000) Resp:  [20-29] 26  (12/13 1000) BP: (119-174)/(50-144) 119/96 mmHg (12/13 1000) SpO2:  [94 %-98 %] 97 % (12/13 1000) FiO2 (%):  [30 %] 30 % (12/13 0835)  HEMODYNAMICS:   VENTILATOR SETTINGS: Vent Mode:  [-] PRVC FiO2 (%):  [30 %] 30 % Set Rate:  [16 bmp] 16 bmp Vt Set:  [500 mL] 500 mL PEEP:  [5 cmH20] 5 cmH20 Plateau Pressure:  [19 cmH20-26 cmH20] 21 cmH20  INTAKE / OUTPUT: Intake/Output      12/12 0701 - 12/13 0700 12/13 0701 - 12/14 0700   I.V. (mL/kg) 395 (3) 25 (0.2)   Other 400 100   NG/GT 700 175   IV Piggyback 660 50   Total Intake(mL/kg) 2155 (16.3) 350 (2.6)   Urine (mL/kg/hr) 2685 (0.8) 1100 (1.6)   Stool 7    Total Output 2692 1100   Net -537 -750        Stool Occurrence 2 x      Intake/Output Summary (Last 24 hours) at 03/31/12 1219 Last data filed at 03/31/12 1100  Gross per 24 hour  Intake   1995 ml  Output   3270 ml  Net  -1275 ml   PHYSICAL EXAMINATION: General:  Morbidly obese, mechanically ventilated, synchronous. Neuro:   Follows commands, alert, want ET tube out and very interactive. HEENT:  PERRL, OETT / OGT. Cardiovascular:  RRR, distant heart sounds, no m/r/g. Lungs:  Bilateral diminished air entry, no w/r/r, surgical absence of the R breast. Abdomen:  Obese. Musculoskeletal:  No edema. Skin:  Intact.  LABS:  Lab 03/31/12 0518 03/31/12 0452 03/30/12 0518 03/30/12 0435 03/29/12 1535 03/29/12 0530 03/28/12 2254 03/28/12 2250 03/28/12 1954  HGB -- 7.7* 7.1* -- -- 7.6* -- -- --  WBC -- 8.8 7.9 -- -- 9.8 -- -- --  PLT -- 277 221 -- -- 265 -- -- --  NA -- 144 147* -- 145 -- -- -- --  K -- 4.0 4.3 -- -- -- -- -- --  CL -- 108 115* -- 111 -- -- -- --  CO2 -- 28 23 -- 24 -- -- -- --  GLUCOSE -- 297* 137* -- 76 -- -- -- --  BUN -- 24* 31* -- 31* -- -- -- --  CREATININE -- 1.07 1.34* -- 1.67* -- -- -- --  CALCIUM -- 8.9 8.2* -- 8.4 -- -- -- --  MG -- 2.1 2.3 -- -- 2.3 -- -- --  PHOS -- 2.8 3.4 -- -- 4.6 -- -- --  AST -- -- -- -- -- -- -- -- 37  ALT -- -- -- -- -- -- -- -- 16  ALKPHOS -- -- -- -- -- -- -- -- 67  BILITOT -- -- -- -- -- -- -- -- 0.2*  PROT -- -- -- -- -- -- -- -- 7.4  ALBUMIN -- -- -- -- -- -- -- -- 2.6*  APTT -- -- -- -- -- -- -- -- --  INR -- -- -- -- -- -- -- -- --  LATICACIDVEN -- -- -- -- -- -- -- 1.0 --  TROPONINI -- -- -- -- -- -- -- -- <0.30  PROCALCITON -- -- 0.49 -- -- 0.82 -- 0.30 --  PROBNP -- -- -- -- -- -- -- -- --  O2SATVEN -- -- -- -- -- -- -- -- --  PHART 7.414 -- -- 7.357 -- -- 7.255* -- --  PCO2ART 44.6 -- -- 44.1 -- -- 59.1* -- --  PO2ART 80.8 -- -- 118.0* -- -- 122.0* -- --    Lab 03/31/12 0740 03/31/12 0313 03/30/12 2325 03/30/12 2006 03/30/12 1645  GLUCAP 313* 288* 259* 301* 195*   IMAGING: 12/10  Head CT >>> nad, 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  OSA.  OHS.  COPD without exacerbation. History of RLL lobectomy for carcinoid tumor.  VDRF due to CO2 narcosis and ?PNA and pulmonary edema. P:   - Goal SpO2>92, pH>7.30. - Active diureses again today via lasix drip. - PS trials but no extubation until more diuresed. - Combivent. - Hold Advair, Nasonex, Spiriva, Zyrtec. - CXR in AM. - Added steroids. - Abx for PNA with staph aureus in the sputum with sensitivities pending.  CARDIOVASCULAR  A: Transient hypotension secondary to sedation / positive pressure ventilation, less likely SIRS / sepsis.  CHF without exacerbation. P:  - Goal MAP>60. - Lactate 1.0. - Troponin <0.30. - Hold Norvasc, Coreg, Aggrenox, Avapro, Crestor, Diovan. - 2D echo ordered. Ef 65%.  RENAL  Intake/Output Summary (Last 24 hours) at 03/31/12 1218 Last data filed at 03/31/12 1100  Gross per 24 hour  Intake   1995 ml  Output   3270 ml  Net  -1275 ml   A:  Hypovolemia.  AKI.  Hyperkalemia.  Lab 03/31/12 0452 03/30/12 0518 03/29/12 1535  K 4.0 4.3 4.8   P:   - Trend BMP. - Given Ca, Insulin, D50, K resolved 12-12 with relative normalization of K, anticipate will drop now that lasix is increased and will recheck BMET at 10 PM and in AM. - KVO IVF. - Lasix drip increased to 10 mg/hr. - NGT flushes. - F/U BMET as needed. - Zaroxolyn ordered.  GASTROINTESTINAL  A:  Morbid obesity. P:   Continue TF.  HEMATOLOGIC  Basename 03/31/12 0452 03/30/12 0518  HGB 7.7* 7.1*   A:  Chronic anemia, stable.  Breast CA. P:  Trend CBC.  INFECTIOUS  A:  No clear source, but immunocompromised and hypotension / leukocytosis. P:   Cultures and antibiotics as above,  will narrow once  sensitivities for staph are available. PCT .49  ENDOCRINE  CBG (last 3)   Basename 03/31/12 0740 03/31/12 0313 03/30/12 2325  GLUCAP 313* 288* 259*   A:  DM.  Hyperglycemia. P:   ICU Glycemic Control Protocol  Phase 1 Hold Lantus, Metformin, Januvia. Insulin drip.  NEUROLOGIC  A:  Acute encephalopathy.  Chronic pain.Awake and follows commands. P:   Goal RASS 0 to -1 Intermittent Fentanyl / Ativan PRN Hold Oxycodone  / Lyrica.  CLINICAL SUMMARY: 66 yo with OSA/OHS, on chronic opioids brought to [facility] ED unresponsive, did not improve with Narcan, intubated for airway protection.  AKI / dehydration / hyperkalemia - Ca/Insulin/D50, IVF.  Hypotensive / leukocytosis / immunocompromised (CA/chemo), no clear source of infection - sepsis work up, empirical abx.  Spoke with son and daughter in law, will continue aggressive care and make no CPR and no cardioversion.  Continue diureses and weaning, anticipate will be able to extubate after aggressive diureses.  I have personally obtained a history, examined the patient, evaluated laboratory and imaging results, formulated the assessment and plan and placed orders.  CRITICAL CARE:  The patient is critically ill with multiple organ systems failure and requires high complexity decision making for assessment and support, frequent evaluation and titration of therapies, application of advanced monitoring technologies and extensive interpretation of multiple databases. Critical Care Time devoted to patient care services described in this note is 35 minutes.   Alyson Reedy, M.D. [facility] Pulmonary/Critical Care Medicine. Pager: [phone]. After hours pager: [phone].

## 2012-03-31 NOTE — Progress Notes (Signed)
ANTIBIOTIC CONSULT NOTE - FOLLOW UP  Pharmacy Consult for Vanco and Zosyn Indication: R/O PNA  No Known Allergies  Patient Measurements: Height: 5\' 7"  (170.2 cm) Weight: 291 lb 3.6 oz (132.1 kg) IBW/kg (Calculated) : 61.6   Vital Signs: Temp: 100 F (37.8 C) (12/13 0800) Temp src: Axillary (12/13 0800) BP: 119/96 mmHg (12/13 1000) Pulse Rate: 102  (12/13 1000) Intake/Output from previous day: 12/12 0701 - 12/13 0700 In: 2155 [I.V.:395; NG/GT:700; IV Piggyback:660] Out: 2692 [Urine:2685; Stool:7] Intake/Output from this shift: Total I/O In: 407.7 [I.V.:27.7; Other:120; NG/GT:210; IV Piggyback:50] Out: 1350 [Urine:1350]  Labs:  Basename 03/31/12 0452 03/30/12 0518 03/29/12 1535 03/29/12 0530  WBC 8.8 7.9 -- 9.8  HGB 7.7* 7.1* -- 7.6*  PLT 277 221 -- 265  LABCREA -- -- -- --  CREATININE 1.07 1.34* 1.67* --   Estimated Creatinine Clearance: 75.3 ml/min (by C-G formula based on Cr of 1.07). No results found for this basename: VANCOTROUGH:2,VANCOPEAK:2,VANCORANDOM:2,GENTTROUGH:2,GENTPEAK:2,GENTRANDOM:2,TOBRATROUGH:2,TOBRAPEAK:2,TOBRARND:2,AMIKACINPEAK:2,AMIKACINTROU:2,AMIKACIN:2, in the last 72 hours   Microbiology: Recent Results (from the past 720 hour(s))  CULTURE, BLOOD (ROUTINE X 2)     Status: Normal (Preliminary result)   Collection Time   03/28/12  8:20 PM      Component Value Range Status Comment   Specimen Description BLOOD LEFT THUMB   Final    Special Requests BOTTLES DRAWN AEROBIC ONLY   Final    Culture  Setup Time 03/29/2012 01:47   Final    Culture     Final    Value:        BLOOD CULTURE RECEIVED NO GROWTH TO DATE CULTURE WILL BE HELD FOR 5 DAYS BEFORE ISSUING A FINAL NEGATIVE REPORT   Report Status PENDING   Incomplete   CULTURE, BLOOD (ROUTINE X 2)     Status: Normal (Preliminary result)   Collection Time   03/28/12  8:30 PM      Component Value Range Status Comment   Specimen Description BLOOD PORTA CATH   Final    Special Requests BOTTLES  DRAWN AEROBIC AND ANAEROBIC 5CC EACH   Final    Culture  Setup Time 03/29/2012 01:48   Final    Culture     Final    Value:        BLOOD CULTURE RECEIVED NO GROWTH TO DATE CULTURE WILL BE HELD FOR 5 DAYS BEFORE ISSUING A FINAL NEGATIVE REPORT   Report Status PENDING   Incomplete   URINE CULTURE     Status: Normal   Collection Time   03/28/12 10:51 PM      Component Value Range Status Comment   Specimen Description URINE, CATHETERIZED   Final    Special Requests NONE   Final    Culture  Setup Time 03/29/2012 08:33   Final    Colony Count NO GROWTH   Final    Culture NO GROWTH   Final    Report Status 03/30/2012 FINAL   Final   MRSA PCR SCREENING     Status: Normal   Collection Time   03/28/12 11:44 PM      Component Value Range Status Comment   MRSA by PCR NEGATIVE  NEGATIVE Final   CULTURE, RESPIRATORY     Status: Normal (Preliminary result)   Collection Time   03/29/12  1:42 AM      Component Value Range Status Comment   Specimen Description TRACHEAL ASPIRATE   Final    Special Requests NONE   Final    Gram Stain  Final    Value: FEW WBC PRESENT,BOTH PMN AND MONONUCLEAR     RARE SQUAMOUS EPITHELIAL CELLS PRESENT     RARE GRAM POSITIVE COCCI IN CLUSTERS     IN PAIRS   Culture     Final    Value: MODERATE STREPTOCOCCUS,BETA HEMOLYIC NOT GROUP A     MODERATE STAPHYLOCOCCUS AUREUS     Note: RIFAMPIN AND GENTAMICIN SHOULD NOT BE USED AS SINGLE DRUGS FOR TREATMENT OF STAPH INFECTIONS.   Report Status PENDING   Incomplete   CLOSTRIDIUM DIFFICILE BY PCR     Status: Normal   Collection Time   03/29/12  7:31 AM      Component Value Range Status Comment   C difficile by pcr NEGATIVE  NEGATIVE Final     Anti-infectives     Start     Dose/Rate Route Frequency Ordered Stop   03/29/12 2200   vancomycin (VANCOCIN) 1,750 mg in sodium chloride 0.9 % 500 mL IVPB        1,750 mg 250 mL/hr over 120 Minutes Intravenous Every 24 hours 03/28/12 2233     03/29/12 0800   piperacillin-tazobactam (ZOSYN) IVPB 3.375 g       3.375 g 12.5 mL/hr over 240 Minutes Intravenous Every 8 hours 03/28/12 2232     03/28/12 2300   vancomycin (VANCOCIN) 2,500 mg in sodium chloride 0.9 % 500 mL IVPB        2,500 mg 250 mL/hr over 120 Minutes Intravenous  Once 03/28/12 2231 03/29/12 0225   03/28/12 2245  piperacillin-tazobactam (ZOSYN) IVPB 3.375 g       3.375 g 100 mL/hr over 30 Minutes Intravenous  Once 03/28/12 2229 03/29/12 0058          Assessment: 66 yo morbidly obese female on Day #3 Vanco and Zosyn for r/o PNA. SCr continues to improve since admit. CrCl(n) ~ 60 ml/min - borderline Vanco dosing. Will continue current abx regimen for now; if renal function continues to improve and if patient stays on vanco much longer, will check a Vanco trough.  Goal of Therapy:  Vancomycin trough level 15-20 mcg/ml  Plan:  1) No changes for now 2) F/U planned duration of abx therapy 3) Watch renal fxn.  Darrol Angel, PharmD Pager: [phone] 03/31/2012,1:01 PM

## 2012-04-01 ENCOUNTER — Inpatient Hospital Stay (HOSPITAL_COMMUNITY): Payer: Medicare Other

## 2012-04-01 LAB — BLOOD GAS, ARTERIAL
Acid-Base Excess: 10.4 mmol/L — ABNORMAL HIGH (ref 0.0–2.0)
Drawn by: 308601
FIO2: 0.3 %
MECHVT: 500 mL
O2 Saturation: 94.6 %
RATE: 16 resp/min

## 2012-04-01 LAB — GLUCOSE, CAPILLARY
Glucose-Capillary: 148 mg/dL — ABNORMAL HIGH (ref 70–99)
Glucose-Capillary: 165 mg/dL — ABNORMAL HIGH (ref 70–99)
Glucose-Capillary: 185 mg/dL — ABNORMAL HIGH (ref 70–99)
Glucose-Capillary: 189 mg/dL — ABNORMAL HIGH (ref 70–99)
Glucose-Capillary: 190 mg/dL — ABNORMAL HIGH (ref 70–99)
Glucose-Capillary: 200 mg/dL — ABNORMAL HIGH (ref 70–99)
Glucose-Capillary: 217 mg/dL — ABNORMAL HIGH (ref 70–99)
Glucose-Capillary: 231 mg/dL — ABNORMAL HIGH (ref 70–99)
Glucose-Capillary: 304 mg/dL — ABNORMAL HIGH (ref 70–99)
Glucose-Capillary: 307 mg/dL — ABNORMAL HIGH (ref 70–99)
Glucose-Capillary: 189 mg/dL — ABNORMAL HIGH (ref 70–99)
Glucose-Capillary: 181 mg/dL — ABNORMAL HIGH (ref 70–99)
Glucose-Capillary: 134 mg/dL — ABNORMAL HIGH (ref 70–99)
Glucose-Capillary: 200 mg/dL — ABNORMAL HIGH (ref 70–99)

## 2012-04-01 LAB — CBC
HCT: 29 % — ABNORMAL LOW (ref 36.0–46.0)
Hemoglobin: 8.2 g/dL — ABNORMAL LOW (ref 12.0–15.0)
MCV: 73.2 fL — ABNORMAL LOW (ref 78.0–100.0)
RBC: 3.96 MIL/uL (ref 3.87–5.11)
WBC: 11.2 10*3/uL — ABNORMAL HIGH (ref 4.0–10.5)

## 2012-04-01 LAB — CULTURE, RESPIRATORY W GRAM STAIN

## 2012-04-01 LAB — BASIC METABOLIC PANEL
CO2: 34 mEq/L — ABNORMAL HIGH (ref 19–32)
Chloride: 101 mEq/L (ref 96–112)
Creatinine, Ser: 1.13 mg/dL — ABNORMAL HIGH (ref 0.50–1.10)
Sodium: 143 mEq/L (ref 135–145)

## 2012-04-01 MED ORDER — POTASSIUM CHLORIDE 20 MEQ/15ML (10%) PO LIQD
40.0000 meq | Freq: Once | ORAL | Status: AC
  Administered 2012-04-01: 40 meq
  Filled 2012-04-01: qty 30

## 2012-04-01 NOTE — Progress Notes (Signed)
eLink Physician-Brief Progress Note Patient Name: Melissa Villa DOB: 18-Sep-1945 MRN: [id number]  Date of Service  04/01/2012   HPI/Events of Note  Hypokalemia   eICU Interventions  Potassium replaced   Intervention Category Minor Interventions: Electrolytes abnormality - evaluation and management  Lekha Dancer 04/01/2012, 12:51 AM

## 2012-04-01 NOTE — Progress Notes (Signed)
Pt bed put in chair position for app. 2 hours. Pt weaned majority of the day.

## 2012-04-01 NOTE — Progress Notes (Signed)
Pt bed in chair position app. 1 hour.

## 2012-04-01 NOTE — Progress Notes (Signed)
Pt up to chair. OG tube pulled out in transit. Okay to leave tube out per Dr. Vassie Loll. Pt has been weaning all day and may be extubated tomorrow.

## 2012-04-01 NOTE — Progress Notes (Signed)
PULMONARY  / CRITICAL CARE MEDICINE  Name: Melissa Villa MRN: [id number] DOB: [birth year]    LOS: 4  REFERRING MD:  EDP  CHIEF COMPLAINT:  Unresponsive  BRIEF PATIENT DESCRIPTION: 66 yo with OSA/OHS, on chronic opioids brought to [facility] ED unresponsive, did not improve with Narcan, intubated for airway protection.   LEVEL OF CARE:  ICU PRIMARY SERVICE:  PCCM CONSULTANTS:  NA CODE STATUS:  Full DIET:  TF DVT Px:  Heparin GI Px:  Protonix   LINES / TUBES: 12/10  OETT>>> 12/10  OGT>>> 12/10  Foley>>> Left chest Medi-Port>>>  CULTURES: 12/10 - MRSA PCr - neg 12/10  Blood >>>NTD 12/11  Sputum >>> MODERATE STREPTOCOCCUS,BETA HEMOLYIC NOT GROUP A and MODERATE STAPHYLOCOCCUS AUREUS (sensit pend) 12/10  Urine >>>NTD 03/29/12 - C Diff - neg  Lab 03/30/12 0518 03/29/12 0530 03/28/12 2250  PROCALCITON 0.49 0.82 0.30      ANTIBIOTICS: Zosyn 12/10 >>> Vanco 12/10 >>>  SIGNIFICANT EVENTS:  12/10  Brought to [facility] ED unresponsive, did not improve with Narcan, intubated for airway protection.    SUBJECTIVE/OVERNIGHT/INTERVAL HX 04/01/2012: REsp cultuers positive. Normal WUA. Has been failing sBt due to tachypnea. Low grade fever +. SBT today starting now   VITAL SIGNS: Temp:  [98.7 F (37.1 C)-99.5 F (37.5 C)] 98.7 F (37.1 C) (12/14 0400) Pulse Rate:  [82-104] 95  (12/14 0826) Resp:  [16-29] 29  (12/14 0826) BP: (119-164)/(60-149) 149/63 mmHg (12/14 0800) SpO2:  [94 %-100 %] 97 % (12/14 0910) FiO2 (%):  [30 %] 30 % (12/14 0910) Weight:  [125.1 kg (275 lb 12.7 oz)] 125.1 kg (275 lb 12.7 oz) (12/14 0500)  HEMODYNAMICS:   VENTILATOR SETTINGS: Vent Mode:  [-] CPAP FiO2 (%):  [30 %] 30 % Set Rate:  [16 bmp] 16 bmp Vt Set:  [500 mL] 500 mL PEEP:  [5 cmH20] 5 cmH20 Pressure Support:  [12 cmH20] 12 cmH20 Plateau Pressure:  [17 cmH20-24 cmH20] 18 cmH20  INTAKE / OUTPUT: Intake/Output      12/13 0701 - 12/14 0700 12/14 0701 - 12/15 0700   I.V. (mL/kg) 284.5 (2.3)     Other 240    NG/GT 210    IV Piggyback 150    Total Intake(mL/kg) 884.5 (7.1)    Urine (mL/kg/hr) 6725 (2.2) 850   Stool     Total Output 6725 850   Net -5840.5 -850          Intake/Output Summary (Last 24 hours) at 04/01/12 0957 Last data filed at 04/01/12 0900  Gross per 24 hour  Intake 714.54 ml  Output   7225 ml  Net -6510.46 ml   PHYSICAL EXAMINATION: General:  Morbidly obese, mechanically ventilated, synchronous. Neuro:  Follows commands, alert, want ET tube out and very interactive. HEENT:  PERRL, OETT / OGT. Cardiovascular:  RRR, distant heart sounds, no m/r/g. Lungs:  Bilateral diminished air entry, no w/r/r, surgical absence of the R breast. Abdomen:  Obese. Musculoskeletal:  No edema. Skin:  Intact.  LABS:  Lab 04/01/12 0434 04/01/12 0331 03/31/12 2200 03/31/12 0518 03/31/12 0452 03/30/12 0518 03/30/12 0435 03/29/12 0530 03/28/12 2250 03/28/12 1954  HGB -- 8.2* -- -- 7.7* 7.1* -- -- -- --  WBC -- 11.2* -- -- 8.8 7.9 -- -- -- --  PLT -- 329 -- -- 277 221 -- -- -- --  NA -- 143 143 -- 144 -- -- -- -- --  K -- 3.5 3.3* -- -- -- -- -- -- --  CL --  101 101 -- 108 -- -- -- -- --  CO2 -- 34* 33* -- 28 -- -- -- -- --  GLUCOSE -- 151* 235* -- 297* -- -- -- -- --  BUN -- 24* 24* -- 24* -- -- -- -- --  CREATININE -- 1.13* 1.08 -- 1.07 -- -- -- -- --  CALCIUM -- 9.8 9.5 -- 8.9 -- -- -- -- --  MG -- 2.0 -- -- 2.1 2.3 -- -- -- --  PHOS -- 2.9 -- -- 2.8 3.4 -- -- -- --  AST -- -- -- -- -- -- -- -- -- 37  ALT -- -- -- -- -- -- -- -- -- 16  ALKPHOS -- -- -- -- -- -- -- -- -- 67  BILITOT -- -- -- -- -- -- -- -- -- 0.2*  PROT -- -- -- -- -- -- -- -- -- 7.4  ALBUMIN -- -- -- -- -- -- -- -- -- 2.6*  APTT -- -- -- -- -- -- -- -- -- --  INR -- -- -- -- -- -- -- -- -- --  LATICACIDVEN -- -- -- -- -- -- -- -- 1.0 --  TROPONINI -- -- -- -- -- -- -- -- -- <0.30  PROCALCITON -- -- -- -- -- 0.49 -- 0.82 0.30 --  PROBNP -- -- -- -- -- -- -- -- -- --  O2SATVEN -- -- -- -- -- -- --  -- -- --  PHART 7.494* -- -- 7.414 -- -- 7.357 -- -- --  PCO2ART 45.5* -- -- 44.6 -- -- 44.1 -- -- --  PO2ART 72.1* -- -- 80.8 -- -- 118.0* -- -- --    Lab 03/31/12 1101 03/31/12 0740 03/31/12 0313 03/30/12 2325 03/30/12 2006  GLUCAP 326* 313* 288* 259* 301*   IMAGING: 12/10  Head CT >>> nad, possible old infarct in the right pons 12/10  CXR >>> Low ETT, small right effusion  Dg Chest Port 1 View  04/01/2012  *RADIOLOGY REPORT*  Clinical Data: ET tube position  PORTABLE CHEST - 1 VIEW  Comparison: 03/31/2012  Findings: Endotracheal tube terminates 4 cm above the carina.  Cardiomegaly with mild interstitial edema, unchanged.  Stable moderate partially loculated right pleural effusion.  No pneumothorax.  Stable right chest power port.  Enteric tube courses below the diaphragm.  Right subclavian pacemaker.  IMPRESSION: Endotracheal tube terminates 4 cm above the carina.  Cardiomegaly with mild interstitial edema, unchanged.  Stable moderate partially loculated right pleural effusion.   Original Report Authenticated By: Charline Bills, M.D.    Dg Chest Port 1 View  03/31/2012  *RADIOLOGY REPORT*  Clinical Data: Endotracheal tube placement  PORTABLE CHEST - 1 VIEW  Comparison: 03/30/2012  Findings: Endotracheal tube tip is positioned 3.3 cm proximal to the carina.  NG tube descends below the level of the image. Left chest wall Port-A-Cath tip projects over the distal SVC.  Right chest wall battery pack with unchanged lead tip positions.  There are numerous additional wires projecting over the patient, presumably external.  Prominent cardiomediastinal contours are similar to prior allowing for differences in positioning/patient rotation.  Loculated right pleural effusion.  Interstitial prominence.  No definite pneumothorax.  Surgical clips bilateral axillae.  No interval osseous change.  IMPRESSION: Prominent cardiomediastinal contours, similar to prior.  Loculated right pleural effusion is similar to  prior.  Interstitial prominence is nonspecific however similar to prior and may reflect edema.   Original Report Authenticated By: Jearld Lesch, M.D.  Dg Chest Port 1 View  03/30/2012  *RADIOLOGY REPORT*  Clinical Data: Evaluate position of endotracheal tube  PORTABLE CHEST - 1 VIEW  Comparison: Portable chest x-ray of 03/30/2012  Findings: The tip of the endotracheal tube is approximately 6.1 cm above the carina.  Aeration of the lungs has improved somewhat. Pleural and parenchymal opacity at the right lung base has increased, and there does appear to be pulmonary vascular congestion present.  A permanent pacemaker remains.  IMPRESSION:  1.  Tip of endotracheal tip approximately 6.1 cm above the carina. 2.  Improved aeration. 3.  Increased pleural and parenchymal opacity at the right lung base with probable pulmonary vascular congestion.   Original Report Authenticated By: Dwyane Dee, M.D.      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  OSA.  OHS.  COPD without exacerbation. History of RLL lobectomy for carcinoid tumor.  VDRF due to CO2 narcosis and ?PNA and pulmonary edema  On 04/01/2012: Starting SBT but likely will fail due to tachypnea.  Imaging and micro suggets R pleural loculated empyema   P:   - CT chest wo contrast  - eval for empyema - Goal SpO2>92, pH>7.30. - Active diureses via lasix drip. - PS trials but no extubation until more diuresed. - Combivent. - Hold Advair, Nasonex, Spiriva, Zyrtec. - CXR in AM. - Added steroids. - Abx for PNA with staph aureus in the sputum with sensitivities  pending.  CARDIOVASCULAR No results found for this basename: PROBNP:5 in the last 168 hours   Lab 03/28/12 1954  TROPONINI <0.30     A: Transient hypotension secondary to sedation / positive pressure ventilation, less likely SIRS / sepsis.  Diast CHF without exacerbation.- 2D echo ordered. Ef 65%. P:  - Goal MAP>60. - - Hold Norvasc, Coreg, Aggrenox, Avapro, Crestor, Diovan.   RENAL  Intake/Output Summary (Last 24 hours) at 04/01/12 0957 Last data filed at 04/01/12 0900  Gross per 24 hour  Intake 714.54 ml  Output   7225 ml  Net -6510.46 ml    Lab 04/01/12 0331 03/31/12 2200 03/31/12 0452 03/30/12 0518 03/29/12 1535 03/29/12 0530 03/29/12 0012  NA 143 143 144 147* 145 -- --  K 3.5 3.3* -- -- -- -- --  CL 101 101 108 115* 111 -- --  CO2 34* 33* 28 23 24  -- --  GLUCOSE 151* 235* 297* 137* 76 -- --  BUN 24* 24* 24* 31* 31* -- --  CREATININE 1.13* 1.08 1.07 1.34* 1.67* -- --  CALCIUM 9.8 9.5 8.9 8.2* 8.4 -- --  MG 2.0 -- 2.1 2.3 -- 2.3 --  PHOS 2.9 -- 2.8 3.4 -- 4.6 5.0*     A: AKI and Hyperkalemia- resolved  On 04/01/2012: hypokalemia repleted via elink  P:   - Trend BMP. -  Lasix drip increased to 10 mg/hr. - Zaroxolyn ordered.  GASTROINTESTINAL  A:  Morbid obesity. P:   Continue TF.  HEMATOLOGIC  Lab 04/01/12 0331 03/31/12 0452 03/30/12 0518  HGB 8.2* 7.7* 7.1*  HCT 29.0* 27.3* 24.7*  WBC 11.2* 8.8 7.9  PLT 329 277 221  A:  Chronic anemia, stable.  Breast CA. P:  - PRBC for hgb </= 6.9gm%    - exceptions are   -  if ACS susepcted/confirmed then transfuse for hgb </= 8.0gm%,  or    -  If septic shock first 24h and scvo2 < 70% then transfuse for hgb </= 9.0gm%   - active bleeding with hemodynamic instability, then transfuse regardless of hemoglobin value   At at all times try to transfuse 1 unit prbc as possible with exception of active hemorrhage    INFECTIOUS  A: Immunocompromised. Resp cultures with staph and strep  P:   Cultures and  antibiotics as above, will narrow once sensitivities for staph are available. Get ct chest  ENDOCRINE  CBG (last 3)   Basename 03/31/12 1101 03/31/12 0740 03/31/12 0313  GLUCAP 326* 313* 288*   A:  DM.  Hyperglycemia. P:   ICU Glycemic Control Protocol  Phase 1 Hold Lantus, Metformin, Januvia. Insulin drip.  NEUROLOGIC  A:  Acute encephalopathy.  Chronic pain.Awake and follows commands. P:   Goal RASS 0 to -1 Intermittent Fentanyl / Ativan PRN Hold Oxycodone  / Lyrica.  CLINICAL SUMMARY: 66 yo with OSA/OHS, on chronic opioids brought to [facility] ED unresponsive, did not improve with Narcan, intubated for airway protection.  AKI / dehydration / hyperkalemia - Ca/Insulin/D50, IVF.  Hypotensive / leukocytosis / immunocompromised (CA/chemo), no clear source of infection - sepsis work up, empirical abx.  Spoke with son and daughter in law, will continue aggressive care and make no CPR and no cardioversion.  Continue diureses and weaning, anticipate will be able to extubate after aggressive diureses. GET CT 04/01/2012      CRITICAL CARE:  The patient is critically ill with multiple organ systems failure and requires high complexity decision making for assessment and support, frequent evaluation and titration of therapies, application of advanced monitoring technologies and extensive interpretation of multiple databases. Critical Care Time devoted to patient care services described in this note is 35 minutes.    Dr. Kalman Shan, M.D., [facility].C.P Pulmonary and Critical Care Medicine Staff Physician [facility] [place] [facility] Critical [facility] Pager: [phone], If no answer or between  15:00h - 7:00h: call [phone]  04/01/2012 10:09 AM

## 2012-04-01 NOTE — Progress Notes (Addendum)
ANTIBIOTIC CONSULT NOTE - FOLLOW UP  Pharmacy Consult for Vanco and Zosyn Indication: MRSA PNA  No Known Allergies  Patient Measurements: Height: 5\' 7"  (170.2 cm) Weight: 275 lb 12.7 oz (125.1 kg) IBW/kg (Calculated) : 61.6   Vital Signs: Temp: 98.7 F (37.1 C) (12/14 0400) Temp src: Oral (12/14 0400) BP: 150/67 mmHg (12/14 1000) Pulse Rate: 100  (12/14 1000) Intake/Output from previous day: 12/13 0701 - 12/14 0700 In: 1549.5 [I.V.:284.5; NG/GT:875; IV Piggyback:150] Out: 6725 [Urine:6725] Intake/Output from this shift: Total I/O In: 212.7 [I.V.:57.7; NG/GT:105; IV Piggyback:50] Out: 1200 [Urine:1200]  Labs:  [facility] 04/01/12 0331 03/31/12 2200 03/31/12 0452 03/30/12 0518  WBC 11.2* -- 8.8 7.9  HGB 8.2* -- 7.7* 7.1*  PLT 329 -- 277 221  LABCREA -- -- -- --  CREATININE 1.13* 1.08 1.07 --   Estimated Creatinine Clearance: 69.1 ml/min (by C-G formula based on Cr of 1.13). No results found for this basename: VANCOTROUGH:2,VANCOPEAK:2,VANCORANDOM:2,GENTTROUGH:2,GENTPEAK:2,GENTRANDOM:2,TOBRATROUGH:2,TOBRAPEAK:2,TOBRARND:2,AMIKACINPEAK:2,AMIKACINTROU:2,AMIKACIN:2, in the last 72 hours   Microbiology: Recent Results (from the past 720 hour(s))  CULTURE, BLOOD (ROUTINE X 2)     Status: Normal (Preliminary result)   Collection Time   03/28/12  8:20 PM      Component Value Range Status Comment   Specimen Description BLOOD LEFT THUMB   Final    Special Requests BOTTLES DRAWN AEROBIC ONLY   Final    Culture  Setup Time 03/29/2012 01:47   Final    Culture     Final    Value:        BLOOD CULTURE RECEIVED NO GROWTH TO DATE CULTURE WILL BE HELD FOR 5 DAYS BEFORE ISSUING A FINAL NEGATIVE REPORT   Report Status PENDING   Incomplete   CULTURE, BLOOD (ROUTINE X 2)     Status: Normal (Preliminary result)   Collection Time   03/28/12  8:30 PM      Component Value Range Status Comment   Specimen Description BLOOD PORTA CATH   Final    Special Requests BOTTLES DRAWN AEROBIC AND  ANAEROBIC 5CC EACH   Final    Culture  Setup Time 03/29/2012 01:48   Final    Culture     Final    Value:        BLOOD CULTURE RECEIVED NO GROWTH TO DATE CULTURE WILL BE HELD FOR 5 DAYS BEFORE ISSUING A FINAL NEGATIVE REPORT   Report Status PENDING   Incomplete   URINE CULTURE     Status: Normal   Collection Time   03/28/12 10:51 PM      Component Value Range Status Comment   Specimen Description URINE, CATHETERIZED   Final    Special Requests NONE   Final    Culture  Setup Time 03/29/2012 08:33   Final    Colony Count NO GROWTH   Final    Culture NO GROWTH   Final    Report Status 03/30/2012 FINAL   Final   MRSA PCR SCREENING     Status: Normal   Collection Time   03/28/12 11:44 PM      Component Value Range Status Comment   MRSA by PCR NEGATIVE  NEGATIVE Final   CULTURE, RESPIRATORY     Status: Normal (Preliminary result)   Collection Time   03/29/12  1:42 AM      Component Value Range Status Comment   Specimen Description TRACHEAL ASPIRATE   Final    Special Requests NONE   Final    Gram Stain  Final    Value: FEW WBC PRESENT,BOTH PMN AND MONONUCLEAR     RARE SQUAMOUS EPITHELIAL CELLS PRESENT     RARE GRAM POSITIVE COCCI IN CLUSTERS     IN PAIRS   Culture     Final    Value: MODERATE STREPTOCOCCUS,BETA HEMOLYIC NOT GROUP A     MODERATE METHICILLIN RESISTANT STAPHYLOCOCCUS AUREUS     Note: RIFAMPIN AND GENTAMICIN SHOULD NOT BE USED AS SINGLE DRUGS FOR TREATMENT OF STAPH INFECTIONS.   Report Status PENDING   Incomplete    Organism ID, Bacteria METHICILLIN RESISTANT STAPHYLOCOCCUS AUREUS   Final   CLOSTRIDIUM DIFFICILE BY PCR     Status: Normal   Collection Time   03/29/12  7:31 AM      Component Value Range Status Comment   C difficile by pcr NEGATIVE  NEGATIVE Final     Anti-infectives     Start     Dose/Rate Route Frequency Ordered Stop   03/29/12 2200   vancomycin (VANCOCIN) 1,750 mg in sodium chloride 0.9 % 500 mL IVPB        1,750 mg 250 mL/hr over 120  Minutes Intravenous Every 24 hours 03/28/12 2233     03/29/12 0800   piperacillin-tazobactam (ZOSYN) IVPB 3.375 g        3.375 g 12.5 mL/hr over 240 Minutes Intravenous Every 8 hours 03/28/12 2232     03/28/12 2300   vancomycin (VANCOCIN) 2,500 mg in sodium chloride 0.9 % 500 mL IVPB        2,500 mg 250 mL/hr over 120 Minutes Intravenous  Once 03/28/12 2231 03/29/12 0225   03/28/12 2245   piperacillin-tazobactam (ZOSYN) IVPB 3.375 g        3.375 g 100 mL/hr over 30 Minutes Intravenous  Once 03/28/12 2229 03/29/12 0058          Assessment: 66 yo morbidly obese female on Day #4 Vanco and Zosyn for r/o PNA. Trach asp Cx now growing moderate MRSA and mod strep. Will check a Vanco trough tonight for MRSA PNA.  Goal of Therapy:  Vancomycin trough level 15-20 mcg/ml  Plan:  1) Vanco trough at 21:30 tonight 2) Pharmacy will f/u 3) Narrow abx soon?  Darrol Angel, PharmD Pager: [phone] 04/01/2012,12:28 PM

## 2012-04-02 ENCOUNTER — Inpatient Hospital Stay (HOSPITAL_COMMUNITY): Payer: Medicare Other

## 2012-04-02 DIAGNOSIS — G4733 Obstructive sleep apnea (adult) (pediatric): Secondary | ICD-10-CM

## 2012-04-02 LAB — BASIC METABOLIC PANEL
BUN: 36 mg/dL — ABNORMAL HIGH (ref 6–23)
CO2: 38 mEq/L — ABNORMAL HIGH (ref 19–32)
GFR calc non Af Amer: 42 mL/min — ABNORMAL LOW (ref >90)
Glucose, Bld: 138 mg/dL — ABNORMAL HIGH (ref 70–99)
Potassium: 2.9 mEq/L — ABNORMAL LOW (ref 3.5–5.1)

## 2012-04-02 LAB — GLUCOSE, CAPILLARY
Glucose-Capillary: 155 mg/dL — ABNORMAL HIGH (ref 70–99)
Glucose-Capillary: 134 mg/dL — ABNORMAL HIGH (ref 70–99)
Glucose-Capillary: 142 mg/dL — ABNORMAL HIGH (ref 70–99)
Glucose-Capillary: 137 mg/dL — ABNORMAL HIGH (ref 70–99)
Glucose-Capillary: 140 mg/dL — ABNORMAL HIGH (ref 70–99)
Glucose-Capillary: 153 mg/dL — ABNORMAL HIGH (ref 70–99)
Glucose-Capillary: 356 mg/dL — ABNORMAL HIGH (ref 70–99)

## 2012-04-02 LAB — CBC
HCT: 33.4 % — ABNORMAL LOW (ref 36.0–46.0)
Hemoglobin: 9.4 g/dL — ABNORMAL LOW (ref 12.0–15.0)
WBC: 10 10*3/uL (ref 4.0–10.5)

## 2012-04-02 LAB — MAGNESIUM: Magnesium: 2 mg/dL (ref 1.5–2.5)

## 2012-04-02 MED ORDER — POTASSIUM CHLORIDE 10 MEQ/50ML IV SOLN
10.0000 meq | INTRAVENOUS | Status: AC
  Administered 2012-04-02 (×6): 10 meq via INTRAVENOUS
  Filled 2012-04-02: qty 100
  Filled 2012-04-02: qty 50
  Filled 2012-04-02: qty 200
  Filled 2012-04-02: qty 50

## 2012-04-02 MED ORDER — INSULIN ASPART 100 UNIT/ML ~~LOC~~ SOLN
0.0000 [IU] | Freq: Three times a day (TID) | SUBCUTANEOUS | Status: DC
  Administered 2012-04-02: 7 [IU] via SUBCUTANEOUS
  Administered 2012-04-03: 11 [IU] via SUBCUTANEOUS
  Administered 2012-04-03: 4 [IU] via SUBCUTANEOUS
  Administered 2012-04-03 – 2012-04-04 (×2): 20 [IU] via SUBCUTANEOUS
  Administered 2012-04-04: 15 [IU] via SUBCUTANEOUS
  Administered 2012-04-04: 3 [IU] via SUBCUTANEOUS
  Administered 2012-04-05: 4 [IU] via SUBCUTANEOUS
  Administered 2012-04-05: 11 [IU] via SUBCUTANEOUS

## 2012-04-02 MED ORDER — INSULIN GLARGINE 100 UNIT/ML ~~LOC~~ SOLN
15.0000 [IU] | Freq: Every day | SUBCUTANEOUS | Status: DC
  Administered 2012-04-02: 15 [IU] via SUBCUTANEOUS

## 2012-04-02 MED ORDER — FENTANYL CITRATE 0.05 MG/ML IJ SOLN: 25.0000 ug | INTRAMUSCULAR | Status: DC | PRN

## 2012-04-02 MED ORDER — ALBUTEROL SULFATE (5 MG/ML) 0.5% IN NEBU
2.5000 mg | INHALATION_SOLUTION | Freq: Four times a day (QID) | RESPIRATORY_TRACT | Status: DC
  Administered 2012-04-02 – 2012-04-05 (×13): 2.5 mg via RESPIRATORY_TRACT
  Filled 2012-04-02 (×13): qty 0.5

## 2012-04-02 MED ORDER — INSULIN GLARGINE 100 UNIT/ML ~~LOC~~ SOLN
15.0000 [IU] | SUBCUTANEOUS | Status: DC
  Administered 2012-04-02: 15 [IU] via SUBCUTANEOUS

## 2012-04-02 MED ORDER — BUDESONIDE 0.5 MG/2ML IN SUSP
0.5000 mg | Freq: Two times a day (BID) | RESPIRATORY_TRACT | Status: DC
  Administered 2012-04-02 – 2012-04-05 (×6): 0.5 mg via RESPIRATORY_TRACT
  Filled 2012-04-02 (×8): qty 2

## 2012-04-02 MED ORDER — CLINDAMYCIN PHOSPHATE 600 MG/50ML IV SOLN
600.0000 mg | Freq: Four times a day (QID) | INTRAVENOUS | Status: DC
  Administered 2012-04-02 – 2012-04-03 (×4): 600 mg via INTRAVENOUS
  Filled 2012-04-02 (×5): qty 50

## 2012-04-02 MED ORDER — IPRATROPIUM BROMIDE 0.02 % IN SOLN
0.5000 mg | Freq: Four times a day (QID) | RESPIRATORY_TRACT | Status: DC
  Administered 2012-04-02 – 2012-04-05 (×13): 0.5 mg via RESPIRATORY_TRACT
  Filled 2012-04-02 (×13): qty 2.5

## 2012-04-02 NOTE — Progress Notes (Signed)
eLink Physician-Brief Progress Note Patient Name: KARMELLA BOUVIER DOB: 02/19/1946 MRN: [id number]  Date of Service  04/02/2012   HPI/Events of Note  cbg high, on diet   eICU Interventions  Add lantus 15   Intervention Category Intermediate Interventions: Hyperglycemia - evaluation and treatment  Amanuel Sinkfield V. 04/02/2012, 9:14 PM

## 2012-04-02 NOTE — Progress Notes (Addendum)
CRITICAL VALUE ALERT  Critical value received: + MRSA in Tracheal Aspirate     Date of notification:  04/02/12  Time of notification:  2045  Critical value read back: yes  Nurse who received alert:  Conley Rolls, RN  MD notified (1st page):  E-Link, MD  Time of first page:  2050  MD notified (2nd page):  Time of second page:  Responding MD:  Glade Nurse, MD  Time MD responded:  2050, no new orders received

## 2012-04-02 NOTE — Progress Notes (Addendum)
PULMONARY  / CRITICAL CARE MEDICINE  Name: Melissa Villa MRN: [id number] DOB: 1945/12/29    LOS: 5  REFERRING MD:  EDP  CHIEF COMPLAINT:  Unresponsive  BRIEF PATIENT DESCRIPTION: 66 yo with OSA/OHS, on chronic opioids brought to [facility] ED unresponsive, did not improve with Narcan, intubated for airway protection.   LEVEL OF CARE:  ICU PRIMARY SERVICE:  PCCM CONSULTANTS:  NA CODE STATUS:  Full DIET:  TF DVT Px:  Heparin GI Px:  Protonix   LINES / TUBES: 12/10  OETT>>>12/15 12/10  OGT>>>12/15 12/10  Foley>>> Left chest Medi-Port>>>  CULTURES: 12/10 - MRSA PCr - neg 12/10  Blood >>>NTD 12/11  Sputum >>> MODERATE STREPTOCOCCUS,BETA HEMOLYIC NOT GROUP A and MRSA 12/10  Urine >>>NTD 03/29/12 - C Diff - neg 04/01/12 CT CHEST - Rt LOCULATED SMALL PLEURAL EFFUSIN c/w EMPYEMA  Lab 03/30/12 0518 03/29/12 0530 03/28/12 2250  PROCALCITON 0.49 0.82 0.30      ANTIBIOTICS: Zosyn 12/10 >>>12/15 Vanco 12/10 >>>12/15 Clindamycin  12/15 (strep and staph) >> (with a view towards taking it po)  SIGNIFICANT EVENTS:  12/10  Brought to [facility] ED unresponsive, did not improve with Narcan, intubated for airway protection. 04/01/2012: REsp cultuers positive. Normal WUA. Did SBT all day   SUBJECTIVE/OVERNIGHT/INTERVAL HX 04/02/12 -  Met weaning criteria and extubated, fever curve reducing well     VITAL SIGNS: Temp:  [98 F (36.7 C)-99.5 F (37.5 C)] 98 F (36.7 C) (12/15 0800) Pulse Rate:  [79-101] 86  (12/15 0800) Resp:  [16-32] 16  (12/15 0800) BP: (149-159)/(76-86) 157/86 mmHg (12/15 0800) SpO2:  [96 %-100 %] 99 % (12/15 0918) FiO2 (%):  [30 %-40 %] 30 % (12/15 0918) Weight:  [121.3 kg (267 lb 6.7 oz)] 121.3 kg (267 lb 6.7 oz) (12/15 0500)  HEMODYNAMICS:   VENTILATOR SETTINGS: Vent Mode:  [-] CPAP FiO2 (%):  [30 %-40 %] 30 % Set Rate:  [16 bmp] 16 bmp Vt Set:  [500 mL] 500 mL PEEP:  [5 cmH20] 5 cmH20 Pressure Support:  [5 cmH20-12 cmH20] 5 cmH20 Plateau Pressure:  [16  cmH20-21 cmH20] 17 cmH20  INTAKE / OUTPUT: Intake/Output      12/14 0701 - 12/15 0700 12/15 0701 - 12/16 0700   I.V. (mL/kg) 266.4 (2.2) 14.2 (0.1)   Other     NG/GT 105    IV Piggyback 50 150   Total Intake(mL/kg) 421.4 (3.5) 164.2 (1.4)   Urine (mL/kg/hr) 4725 (1.6) 1225 (2.5)   Total Output 4725 1225   Net -4303.6 -1060.8        Stool Occurrence 2 x      Intake/Output Summary (Last 24 hours) at 04/02/12 1108 Last data filed at 04/02/12 1000  Gross per 24 hour  Intake 372.84 ml  Output   4750 ml  Net -4377.16 ml   PHYSICAL EXAMINATION: General:  Morbidly obese, mechanically ventilated, synchronous. Neuro:  Follows commands, alert, want ET tube out and very interactive. HEENT:  PERRL, OETT / OGT. Cardiovascular:  RRR, distant heart sounds, no m/r/g. Lungs:  Bilateral diminished air entry, no w/r/r, surgical absence of the R breast. Abdomen:  Obese. Musculoskeletal:  No edema. Skin:  Intact.  LABS:  Lab 04/02/12 0729 04/01/12 0434 04/01/12 0331 03/31/12 2200 03/31/12 0518 03/31/12 0452 03/30/12 0518 03/30/12 0435 03/29/12 0530 03/28/12 2250 03/28/12 1954  HGB 9.4* -- 8.2* -- -- 7.7* -- -- -- -- --  WBC 10.0 -- 11.2* -- -- 8.8 -- -- -- -- --  PLT 331 --  329 -- -- 277 -- -- -- -- --  NA 145 -- 143 143 -- -- -- -- -- -- --  K 2.9* -- 3.5 -- -- -- -- -- -- -- --  CL 95* -- 101 101 -- -- -- -- -- -- --  CO2 38* -- 34* 33* -- -- -- -- -- -- --  GLUCOSE 138* -- 151* 235* -- -- -- -- -- -- --  BUN 36* -- 24* 24* -- -- -- -- -- -- --  CREATININE 1.31* -- 1.13* 1.08 -- -- -- -- -- -- --  CALCIUM 10.1 -- 9.8 9.5 -- -- -- -- -- -- --  MG 2.0 -- 2.0 -- -- 2.1 -- -- -- -- --  PHOS 5.5* -- 2.9 -- -- 2.8 -- -- -- -- --  AST -- -- -- -- -- -- -- -- -- -- 37  ALT -- -- -- -- -- -- -- -- -- -- 16  ALKPHOS -- -- -- -- -- -- -- -- -- -- 67  BILITOT -- -- -- -- -- -- -- -- -- -- 0.2*  PROT -- -- -- -- -- -- -- -- -- -- 7.4  ALBUMIN -- -- -- -- -- -- -- -- -- -- 2.6*  APTT -- -- --  -- -- -- -- -- -- -- --  INR -- -- -- -- -- -- -- -- -- -- --  LATICACIDVEN -- -- -- -- -- -- -- -- -- 1.0 --  TROPONINI -- -- -- -- -- -- -- -- -- -- <0.30  PROCALCITON -- -- -- -- -- -- 0.49 -- 0.82 0.30 --  PROBNP 441.0* -- -- -- -- -- -- -- -- -- --  O2SATVEN -- -- -- -- -- -- -- -- -- -- --  PHART -- 7.494* -- -- 7.414 -- -- 7.357 -- -- --  PCO2ART -- 45.5* -- -- 44.6 -- -- 44.1 -- -- --  PO2ART -- 72.1* -- -- 80.8 -- -- 118.0* -- -- --    Lab 04/02/12 0922 04/02/12 0833 04/02/12 0727 04/02/12 0625 04/02/12 0514  GLUCAP 161* 153* 140* 137* 145*   IMAGING: 12/10  Head CT >>> nad, possible old infarct in the right pons 12/10  CXR >>> Low ETT, small right effusion  Ct Chest Wo Contrast  04/01/2012  *RADIOLOGY REPORT*  Clinical Data: Acute respiratory failure.  On ventilator.  Right pleural effusion.  Possible empyema.  CT CHEST WITHOUT CONTRAST  Technique:  Multidetector CT imaging of the chest was performed following the standard protocol without IV contrast.  Comparison: 01/27/2012  Findings: Mild increase in size of a small right pleural effusion is seen which is a lobulated likely due to loculations.  Mild peripheral compressive atelectasis noted mainly in the lower lobe, however there is no evidence of pulmonary consolidation or mass.  Shotty mediastinal lymphadenopathy is seen but decreased since previous exam.  Tiny pericardial effusion versus pericardial thickening is stable.  IMPRESSION:  1.  Mild increase in size of small multiloculated right pleural effusion. 2.  Stable tiny pericardial effusion versus pericardial thickening. 3.  Decreased mediastinal lymphadenopathy since prior exam.   Original Report Authenticated By: Myles Rosenthal, M.D.    Dg Chest Port 1 View  04/02/2012  *RADIOLOGY REPORT*  Clinical Data: Check endotracheal tube  PORTABLE CHEST - 1 VIEW  Comparison: Chest radiograph 04/01/2012  Findings: Left power port and endotracheal tube are unchanged. Right-sided  pacemaker noted.  Stable cardiac silhouette.  There  is a partially loculated fluid in the right hemithorax unchanged from prior.  There is a fine interstitial edema pattern unchanged.  No pneumothorax.  IMPRESSION:  1.  Stable support apparatus. 2.  No interval change. 3.  Loculated right pleural fluid and interstitial edema pattern.   Original Report Authenticated By: Genevive Bi, M.D.    Dg Chest Port 1 View  04/01/2012  *RADIOLOGY REPORT*  Clinical Data: ET tube position  PORTABLE CHEST - 1 VIEW  Comparison: 03/31/2012  Findings: Endotracheal tube terminates 4 cm above the carina.  Cardiomegaly with mild interstitial edema, unchanged.  Stable moderate partially loculated right pleural effusion.  No pneumothorax.  Stable right chest power port.  Enteric tube courses below the diaphragm.  Right subclavian pacemaker.  IMPRESSION: Endotracheal tube terminates 4 cm above the carina.  Cardiomegaly with mild interstitial edema, unchanged.  Stable moderate partially loculated right pleural effusion.   Original Report Authenticated By: Charline Bills, M.D.      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  OSA.  OHS.  COPD without exacerbation. History of RLL lobectomy for carcinoid tumor.  VDRF due to strep and MRSA PNA AND rt EMPYEMA  oN 04/01/12 ct SHOWS  Rt empyema - small On 04/02/2012:  extubated  P:   - Aggressive pulm toilet post extubation  - Doubt empyema amenable to chest tube or VATS - too small, probably best to monitor resolution clinically - use cpap qhs (diagnosis of baseline OSA and uses cpap at  home)  CARDIOVASCULAR  Lab 04/02/12 0729  PROBNP 441.0*      Lab 03/28/12 1954  TROPONINI <0.30     A:  Diast CHF without exacerbation.- 2D echo ordered. Ef 65%. On 04/02/12: lasix drip continues P:  - Goal MAP>60. - - Hold Norvasc, Coreg, Aggrenox, Avapro, Crestor, Diovan for now; consider restart ? 04/03/12   RENAL  Intake/Output Summary (Last 24 hours) at 04/02/12 1108 Last data filed at 04/02/12 1000  Gross per 24 hour  Intake 372.84 ml  Output   4750 ml  Net -4377.16 ml    Lab 04/02/12 0729 04/01/12 0331 03/31/12 2200 03/31/12 0452 03/30/12 0518 03/29/12 0530  NA 145 143 143 144 147* --  K 2.9* 3.5 -- -- -- --  CL 95* 101 101 108 115* --  CO2 38* 34* 33* 28 23 --  GLUCOSE 138* 151* 235* 297* 137* --  BUN 36* 24* 24* 24* 31* --  CREATININE 1.31* 1.13* 1.08 1.07 1.34* --  CALCIUM 10.1 9.8 9.5 8.9 8.2* --  MG 2.0 2.0 -- 2.1 2.3 2.3  PHOS 5.5* 2.9 -- 2.8 3.4 4.6     A: AKI and Hyperkalemia- resolved  On 04/02/2012: hypokalemia due to lasix and getting pre-renal again at lasix  P:   - dc lasix - replete k - monitor bnp0 and renal function   GASTROINTESTINAL  A:  Morbid obesity. P:   Dc tube feeds due to extubation  advance in 4h if not reintubated  HEMATOLOGIC  Lab 04/02/12 0729 04/01/12 0331 03/31/12 0452  HGB 9.4* 8.2* 7.7*  HCT 33.4* 29.0* 27.3*  WBC 10.0  11.2* 8.8  PLT 331 329 277    A:  Chronic anemia, stable.  Breast CA. P:  - PRBC for hgb </= 6.9gm%    - exceptions are   -  if ACS susepcted/confirmed then transfuse for hgb </= 8.0gm%,  or    -  If septic shock first 24h and scvo2 < 70% then transfuse for hgb </= 9.0gm%   - active bleeding with hemodynamic instability, then transfuse regardless of hemoglobin value   At at all times try to transfuse 1 unit prbc as possible with exception of active hemorrhage    INFECTIOUS  A: Immunocompromised.  Resp cultures with beta hemoytic strep (not group A) and  MRSAp Small Rt empyema  on CT 04/01/12  P:   Narrow abx as above Monitor empyema clinically; doubt role for chest tube v surgery but will have to redecide depending on clinical course   ENDOCRINE  CBG (last 3)   Basename 04/02/12 0922 04/02/12 0833 04/02/12 0727  GLUCAP 161* 153* 140*   A:  DM.  Hyperglycemia. P:   DC ICU hyperglycemia Do ssi Hold Lantus, Metformin, Januvia. ip.  NEUROLOGIC  A:  Acute encephalopathy post intubtin initiallu.  Chronic pain.  On 04/02/12: Awake and follows commands. CAM-IC neg for delirium  P:   Goal RASS 0 to -1 Intermittent Fentanyl / Ativan PRN Hold Oxycodone  / Lyrica; consider restat 04/03/12 if still extubated and swallowing ok     CRITICAL CARE:  The patient is critically ill with multiple organ systems failure and requires high complexity decision making for assessment and support, frequent evaluation and titration of therapies, application of advanced monitoring technologies and extensive interpretation of multiple databases. Critical Care Time devoted to patient care services described in this note is 35 minutes.    Dr. Kalman Shan, M.D., [facility].C.P Pulmonary and Critical Care Medicine Staff Physician [facility] Pager: [phone], If no answer or between  15:00h - 7:00h: call [phone]  04/02/2012 11:08 AM

## 2012-04-02 NOTE — Progress Notes (Signed)
Nutrition Follow-up/New TF Consult  Intervention:   1. If patient to remain extubated, advance diet as tolerated.  2. If patient reintubated and low-carb TF formula desired, Recommend initiate Glucerna 1.2 at 15 ml/hr, advance by 10 ml/hr every 4 hours to goal rate of 35 ml/hr with 30 ml Prostat 5 times daily to provide 1508 kcal, 125 g protein, and 676 ml free water.   Assessment:   New consult received to change TF formula to low carb, due to elevated glucose. However, patient extubated today and TF d/c'd.   Diet Order:  NPO  Meds: Scheduled Meds:   . albuterol-ipratropium  6 puff Inhalation Q4H  . antiseptic oral rinse  1 application Mouth Rinse QID  . chlorhexidine  15 mL Mouth/Throat BID  . clindamycin (CLEOCIN) IV  600 mg Intravenous Q6H  . feeding supplement  30 mL Oral 5 X Daily  . heparin subcutaneous  5,000 Units Subcutaneous Q8H  . methylPREDNISolone (SOLU-MEDROL) injection  40 mg Intravenous Q8H  . pantoprazole (PROTONIX) IV  40 mg Intravenous Q24H  . potassium chloride  10 mEq Intravenous Q1 Hr x 6  . vancomycin  1,750 mg Intravenous Q24H   Continuous Infusions:  PRN Meds:.acetaminophen, albuterol-ipratropium, fentaNYL   CMP     Component Value Date/Time   NA 145 04/02/2012 0729   NA 143 03/24/2012 1117   K 2.9* 04/02/2012 0729   K 5.2* 03/24/2012 1117   CL 95* 04/02/2012 0729   CL 106 03/24/2012 1117   CO2 38* 04/02/2012 0729   CO2 27 03/24/2012 1117   GLUCOSE 138* 04/02/2012 0729   GLUCOSE 143* 03/24/2012 1117   BUN 36* 04/02/2012 0729   BUN 16.0 03/24/2012 1117   CREATININE 1.31* 04/02/2012 0729   CREATININE 1.2* 03/24/2012 1117   CREATININE 1.01 03/22/2012 1215   CALCIUM 10.1 04/02/2012 0729   CALCIUM 9.4 03/24/2012 1117   PROT 7.4 03/28/2012 1954   PROT 6.7 03/24/2012 1117   ALBUMIN 2.6* 03/28/2012 1954   ALBUMIN 2.7* 03/24/2012 1117   AST 37 03/28/2012 1954   AST 22 03/24/2012 1117   ALT 16 03/28/2012 1954   ALT 27 03/24/2012 1117   ALKPHOS 67 03/28/2012  1954   ALKPHOS 84 03/24/2012 1117   BILITOT 0.2* 03/28/2012 1954   BILITOT 0.21 03/24/2012 1117   GFRNONAA 42* 04/02/2012 0729   GFRAA 49* 04/02/2012 0729    CBG (last 3)   Basename 04/02/12 0922 04/02/12 0833 04/02/12 0727  GLUCAP 161* 153* 140*     Intake/Output Summary (Last 24 hours) at 04/02/12 1137 Last data filed at 04/02/12 1000  Gross per 24 hour  Intake 372.84 ml  Output   4750 ml  Net -4377.16 ml    Weight Status:  121.3 kg down from 132.1 kg on 12/11  Re-estimated needs:  2000-2100 kcal, 122-153 g protein, >1.8 L free water  Nutrition Dx:  Inadequate oral intake, ongoing  Goal:  Patient will tolerate diet advance to meet >90% of estimated needs.   Monitor:  Diet advance, PO intake, respiratory status, weight, labs   Linnell Fulling, RD, LDN Pager #: [phone] After-Hours Pager #: [phone]

## 2012-04-02 NOTE — Progress Notes (Signed)
ANTIBIOTIC CONSULT NOTE - FOLLOW UP  Pharmacy Consult for vancomycin Indication: rule out pneumonia  No Known Allergies  Patient Measurements: Height: 5\' 7"  (170.2 cm) Weight: 267 lb 6.7 oz (121.3 kg) IBW/kg (Calculated) : 61.6  Adjusted Body Weight:   Vital Signs: Temp: 99.5 F (37.5 C) (12/15 0400) Temp src: Oral (12/15 0400) BP: 159/81 mmHg (12/15 0425) Pulse Rate: 79  (12/15 0425) Intake/Output from previous day: 12/14 0701 - 12/15 0700 In: 347.2 [I.V.:192.2; NG/GT:105; IV Piggyback:50] Out: 4725 [Urine:4725] Intake/Output from this shift: Total I/O In: 91 [I.V.:91] Out: 1975 [Urine:1975]  Labs:  [facility] 04/01/12 0331 03/31/12 2200 03/31/12 0452  WBC 11.2* -- 8.8  HGB 8.2* -- 7.7*  PLT 329 -- 277  LABCREA -- -- --  CREATININE 1.13* 1.08 1.07   Estimated Creatinine Clearance: 67.9 ml/min (by C-G formula based on Cr of 1.13).  Basename 04/01/12 2245  VANCOTROUGH 17.0  VANCOPEAK --  VANCORANDOM --  GENTTROUGH --  GENTPEAK --  GENTRANDOM --  TOBRATROUGH --  TOBRAPEAK --  TOBRARND --  AMIKACINPEAK --  AMIKACINTROU --  AMIKACIN --     Microbiology: Recent Results (from the past 720 hour(s))  CULTURE, BLOOD (ROUTINE X 2)     Status: Normal (Preliminary result)   Collection Time   03/28/12  8:20 PM      Component Value Range Status Comment   Specimen Description BLOOD LEFT THUMB   Final    Special Requests BOTTLES DRAWN AEROBIC ONLY   Final    Culture  Setup Time 03/29/2012 01:47   Final    Culture     Final    Value:        BLOOD CULTURE RECEIVED NO GROWTH TO DATE CULTURE WILL BE HELD FOR 5 DAYS BEFORE ISSUING A FINAL NEGATIVE REPORT   Report Status PENDING   Incomplete   CULTURE, BLOOD (ROUTINE X 2)     Status: Normal (Preliminary result)   Collection Time   03/28/12  8:30 PM      Component Value Range Status Comment   Specimen Description BLOOD PORTA CATH   Final    Special Requests BOTTLES DRAWN AEROBIC AND ANAEROBIC 5CC EACH   Final    Culture  Setup Time 03/29/2012 01:48   Final    Culture     Final    Value:        BLOOD CULTURE RECEIVED NO GROWTH TO DATE CULTURE WILL BE HELD FOR 5 DAYS BEFORE ISSUING A FINAL NEGATIVE REPORT   Report Status PENDING   Incomplete   URINE CULTURE     Status: Normal   Collection Time   03/28/12 10:51 PM      Component Value Range Status Comment   Specimen Description URINE, CATHETERIZED   Final    Special Requests NONE   Final    Culture  Setup Time 03/29/2012 08:33   Final    Colony Count NO GROWTH   Final    Culture NO GROWTH   Final    Report Status 03/30/2012 FINAL   Final   MRSA PCR SCREENING     Status: Normal   Collection Time   03/28/12 11:44 PM      Component Value Range Status Comment   MRSA by PCR NEGATIVE  NEGATIVE Final   CULTURE, RESPIRATORY     Status: Normal   Collection Time   03/29/12  1:42 AM      Component Value Range Status Comment   Specimen Description TRACHEAL  ASPIRATE   Final    Special Requests NONE   Final    Gram Stain     Final    Value: FEW WBC PRESENT,BOTH PMN AND MONONUCLEAR     RARE SQUAMOUS EPITHELIAL CELLS PRESENT     RARE GRAM POSITIVE COCCI IN CLUSTERS     IN PAIRS   Culture     Final    Value: MODERATE STREPTOCOCCUS,BETA HEMOLYIC NOT GROUP A     MODERATE METHICILLIN RESISTANT STAPHYLOCOCCUS AUREUS     Note: RIFAMPIN AND GENTAMICIN SHOULD NOT BE USED AS SINGLE DRUGS FOR TREATMENT OF STAPH INFECTIONS. CRITICAL RESULT CALLED TO, READ BACK BY AND VERIFIED WITH: GARLAND RN 845PM 04/01/12 GUSTK   Report Status 04/01/2012 FINAL   Final    Organism ID, Bacteria METHICILLIN RESISTANT STAPHYLOCOCCUS AUREUS   Final   CLOSTRIDIUM DIFFICILE BY PCR     Status: Normal   Collection Time   03/29/12  7:31 AM      Component Value Range Status Comment   C difficile by pcr NEGATIVE  NEGATIVE Final     Anti-infectives     Start     Dose/Rate Route Frequency Ordered Stop   03/29/12 2200   vancomycin (VANCOCIN) 1,750 mg in sodium chloride 0.9 % 500 mL IVPB         1,750 mg 250 mL/hr over 120 Minutes Intravenous Every 24 hours 03/28/12 2233     03/29/12 0800  piperacillin-tazobactam (ZOSYN) IVPB 3.375 g       3.375 g 12.5 mL/hr over 240 Minutes Intravenous Every 8 hours 03/28/12 2232     03/28/12 2300   vancomycin (VANCOCIN) 2,500 mg in sodium chloride 0.9 % 500 mL IVPB        2,500 mg 250 mL/hr over 120 Minutes Intravenous  Once 03/28/12 2231 03/29/12 0225   03/28/12 2245  piperacillin-tazobactam (ZOSYN) IVPB 3.375 g       3.375 g 100 mL/hr over 30 Minutes Intravenous  Once 03/28/12 2229 03/29/12 0058          Assessment: Patient with PNA.  Vancomycin level at goal.  Goal of Therapy:  Vancomycin trough level 15-20 mcg/ml  Plan:  Measure antibiotic drug levels at steady state Follow up culture results Continue with current dose of vancomycin  Melissa Villa, Melissa Villa 04/02/2012,5:40 AM

## 2012-04-03 LAB — GLUCOSE, CAPILLARY
Glucose-Capillary: 435 mg/dL — ABNORMAL HIGH (ref 70–99)
Glucose-Capillary: 180 mg/dL — ABNORMAL HIGH (ref 70–99)
Glucose-Capillary: 169 mg/dL — ABNORMAL HIGH (ref 70–99)

## 2012-04-03 LAB — CBC
HCT: 32 % — ABNORMAL LOW (ref 36.0–46.0)
Platelets: 338 10*3/uL (ref 150–400)
RDW: 22.2 % — ABNORMAL HIGH (ref 11.5–15.5)
WBC: 9.4 10*3/uL (ref 4.0–10.5)

## 2012-04-03 LAB — BASIC METABOLIC PANEL
BUN: 47 mg/dL — ABNORMAL HIGH (ref 6–23)
Creatinine, Ser: 1.31 mg/dL — ABNORMAL HIGH (ref 0.50–1.10)
GFR calc non Af Amer: 42 mL/min — ABNORMAL LOW (ref >90)
Glucose, Bld: 327 mg/dL — ABNORMAL HIGH (ref 70–99)
Potassium: 3.3 mEq/L — ABNORMAL LOW (ref 3.5–5.1)

## 2012-04-03 MED ORDER — INSULIN GLARGINE 100 UNIT/ML ~~LOC~~ SOLN
30.0000 [IU] | Freq: Every day | SUBCUTANEOUS | Status: DC
  Administered 2012-04-03: 30 [IU] via SUBCUTANEOUS

## 2012-04-03 MED ORDER — CHLORHEXIDINE GLUCONATE CLOTH 2 % EX PADS
6.0000 | MEDICATED_PAD | Freq: Every day | CUTANEOUS | Status: DC
  Administered 2012-04-03 – 2012-04-05 (×3): 6 via TOPICAL

## 2012-04-03 MED ORDER — AMLODIPINE BESYLATE 10 MG PO TABS
10.0000 mg | ORAL_TABLET | Freq: Every day | ORAL | Status: DC
  Administered 2012-04-03 – 2012-04-05 (×3): 10 mg via ORAL
  Filled 2012-04-03 (×3): qty 1

## 2012-04-03 MED ORDER — BEPOTASTINE BESILATE 1.5 % OP SOLN
1.0000 [drp] | Freq: Two times a day (BID) | OPHTHALMIC | Status: DC
  Administered 2012-04-03 – 2012-04-05 (×6): 1 [drp] via OPHTHALMIC

## 2012-04-03 MED ORDER — POTASSIUM CHLORIDE CRYS ER 20 MEQ PO TBCR
40.0000 meq | EXTENDED_RELEASE_TABLET | Freq: Once | ORAL | Status: AC
  Administered 2012-04-03: 40 meq via ORAL
  Filled 2012-04-03: qty 2

## 2012-04-03 MED ORDER — MUPIROCIN 2 % EX OINT
1.0000 "application " | TOPICAL_OINTMENT | Freq: Two times a day (BID) | CUTANEOUS | Status: DC
  Administered 2012-04-03 – 2012-04-05 (×6): 1 via NASAL
  Filled 2012-04-03 (×2): qty 22

## 2012-04-03 MED ORDER — PANTOPRAZOLE SODIUM 40 MG PO TBEC
40.0000 mg | DELAYED_RELEASE_TABLET | Freq: Every day | ORAL | Status: DC
  Administered 2012-04-04 – 2012-04-05 (×2): 40 mg via ORAL
  Filled 2012-04-03 (×2): qty 1

## 2012-04-03 MED ORDER — PREGABALIN 100 MG PO CAPS: 200.0000 mg | ORAL_CAPSULE | Freq: Two times a day (BID) | ORAL | Status: DC

## 2012-04-03 MED ORDER — PREGABALIN 100 MG PO CAPS
200.0000 mg | ORAL_CAPSULE | Freq: Two times a day (BID) | ORAL | Status: DC
  Administered 2012-04-03 – 2012-04-05 (×5): 200 mg via ORAL
  Filled 2012-04-03: qty 2
  Filled 2012-04-03 (×2): qty 1
  Filled 2012-04-03 (×3): qty 2

## 2012-04-03 MED ORDER — PREDNISONE 20 MG PO TABS
40.0000 mg | ORAL_TABLET | Freq: Every day | ORAL | Status: DC
  Administered 2012-04-04 – 2012-04-05 (×2): 40 mg via ORAL
  Filled 2012-04-03 (×3): qty 2

## 2012-04-03 MED ORDER — ASPIRIN-DIPYRIDAMOLE ER 25-200 MG PO CP12
1.0000 | ORAL_CAPSULE | Freq: Two times a day (BID) | ORAL | Status: DC
  Administered 2012-04-03 – 2012-04-05 (×5): 1 via ORAL
  Filled 2012-04-03 (×6): qty 1

## 2012-04-03 MED ORDER — CLINDAMYCIN HCL 300 MG PO CAPS
300.0000 mg | ORAL_CAPSULE | Freq: Four times a day (QID) | ORAL | Status: DC
  Administered 2012-04-03 – 2012-04-05 (×9): 300 mg via ORAL
  Filled 2012-04-03 (×13): qty 1

## 2012-04-03 MED ORDER — CARVEDILOL 6.25 MG PO TABS
6.2500 mg | ORAL_TABLET | Freq: Two times a day (BID) | ORAL | Status: DC
  Administered 2012-04-03 – 2012-04-05 (×5): 6.25 mg via ORAL
  Filled 2012-04-03 (×6): qty 1

## 2012-04-03 MED ORDER — INSULIN GLARGINE 100 UNIT/ML ~~LOC~~ SOLN: 25.0000 [IU] | Freq: Every day | SUBCUTANEOUS | Status: DC

## 2012-04-03 MED ORDER — INSULIN ASPART 100 UNIT/ML ~~LOC~~ SOLN
5.0000 [IU] | Freq: Once | SUBCUTANEOUS | Status: AC
  Administered 2012-04-03: 5 [IU] via SUBCUTANEOUS

## 2012-04-03 MED ORDER — METHYLPREDNISOLONE SODIUM SUCC 40 MG IJ SOLR: 40.0000 mg | Freq: Two times a day (BID) | INTRAMUSCULAR | Status: DC

## 2012-04-03 MED ORDER — OXYCODONE HCL 5 MG PO TABS
5.0000 mg | ORAL_TABLET | Freq: Four times a day (QID) | ORAL | Status: DC | PRN
  Administered 2012-04-04: 5 mg via ORAL
  Filled 2012-04-03: qty 1

## 2012-04-03 NOTE — Evaluation (Signed)
Physical Therapy Evaluation Patient Details Name: Melissa Villa MRN: [id number] DOB: 1945/12/11 Today's Date: 04/03/2012 Time: 1200-1230 PT Time Calculation (min): 30 min  PT Assessment / Plan / Recommendation Clinical Impression  Pt . was admitted 12/10 with LOC, anemia, VDRF, extubated  12/15. Pt. lives alone but has family support. Pt.will benefit from PT while in acute care. Recommend HHPT>    PT Assessment  Patient needs continued PT services    Follow Up Recommendations  Home health PT    Does the patient have the potential to tolerate intense rehabilitation      Barriers to Discharge        Equipment Recommendations  None recommended by PT    Recommendations for Other Services OT consult   Frequency Min 3X/week    Precautions / Restrictions Precautions Precaution Comments: monitor sats.   Pertinent Vitals/Pain sats 88% 2 l after ambulating x 20 ft.      Mobility  Transfers Transfers: Sit to Stand;Stand to Sit Sit to Stand: 5: Supervision;From chair/3-in-1;With armrests Stand to Sit: 5: Supervision;To chair/3-in-1;With armrests Ambulation/Gait Ambulation/Gait Assistance: 4: Min guard Ambulation Distance (Feet): 20 Feet Assistive device: Rolling walker Ambulation/Gait Assistance Details: pt moves slowly, encouraged pursed lip breathing Gait Pattern: Step-through pattern;Wide base of support    Shoulder Instructions     Exercises     PT Diagnosis: Difficulty walking;Generalized weakness  PT Problem List: Decreased strength;Decreased activity tolerance;Decreased mobility;Decreased knowledge of use of DME;Cardiopulmonary status limiting activity PT Treatment Interventions: DME instruction;Gait training;Functional mobility training;Therapeutic activities;Patient/family education   PT Goals Acute Rehab PT Goals PT Goal Formulation: With patient/family Time For Goal Achievement: 04/03/12 Potential to Achieve Goals: Good Pt will go Supine/Side to Sit:  Independently PT Goal: Supine/Side to Sit - Progress: Goal set today Pt will go Sit to Supine/Side: Independently PT Goal: Sit to Supine/Side - Progress: Goal set today Pt will go Sit to Stand: with modified independence PT Goal: Sit to Stand - Progress: Goal set today Pt will go Stand to Sit: with modified independence PT Goal: Stand to Sit - Progress: Goal set today Pt will Ambulate: 51 - 150 feet;with supervision;with least restrictive assistive device PT Goal: Ambulate - Progress: Goal set today  Visit Information  Last PT Received On: 04/03/12 Assistance Needed: +1    Subjective Data  Subjective: I can walk Patient Stated Goal: to go hjome.   Prior Functioning  Home Living Lives With: Alone Available Help at Discharge: Family;Available PRN/intermittently Type of Home: House Home Access: Stairs to enter [employer] of Steps: 2-3 Entrance Stairs-Rails: Right;Left Home Layout: One level Bathroom Shower/Tub: Advice worker with back Prior Function Level of Independence: Independent Able to Take Stairs?: Yes Driving: Yes Communication Communication: No difficulties    Cognition  Overall Cognitive Status: Appears within functional limits for tasks assessed/performed Arousal/Alertness: Awake/alert Orientation Level: Appears intact for tasks assessed    Extremity/Trunk Assessment Right Upper Extremity Assessment RUE ROM/Strength/Tone: [facility] for tasks assessed Left Upper Extremity Assessment LUE ROM/Strength/Tone: WFL for tasks assessed Right Lower Extremity Assessment RLE ROM/Strength/Tone: [facility] for tasks assessed Left Lower Extremity Assessment LLE ROM/Strength/Tone: WFL for tasks assessed   Balance    End of Session PT - End of Session Activity Tolerance: Patient limited by fatigue;Patient tolerated treatment well Patient left: in chair;with call bell/phone within reach;with family/visitor  present Nurse Communication: Mobility status  GP     Rada Hay 04/03/2012, 2:30 PM  [phone]

## 2012-04-03 NOTE — Progress Notes (Signed)
RT placed patient on CPAP at 13cm H2O per home settings with 2L of oxygen bled in. Patient has home mask. Sterile water added to fill line. Patient states she is comfortable and will call if she needs further assistance.

## 2012-04-03 NOTE — Progress Notes (Signed)
PULMONARY  / CRITICAL CARE MEDICINE  Name: Melissa Villa MRN: [id number] DOB: 15-Apr-1946    LOS: 6  REFERRING MD:  EDP  CHIEF COMPLAINT:  Unresponsive  BRIEF PATIENT DESCRIPTION: 66 yo with OSA/OHS, on chronic opioids brought to [facility] ED unresponsive, did not improve with Narcan, intubated for airway protection.   LEVEL OF CARE:  tele PRIMARY SERVICE:  PCCM/triad on 12-17 CONSULTANTS:  NA CODE STATUS:  Full DIET:  Low carb DVT Px:  Heparin GI Px:  Protonix   LINES / TUBES: 12/10  OETT>>>12/15 12/10  OGT>>>12/15 12/10  Foley>>> Left chest Medi-Port>>>  CULTURES: 12/10 - MRSA PCr - neg 12/10  Blood >>>NTD 12/11  Sputum >>> MODERATE STREPTOCOCCUS,BETA HEMOLYIC NOT GROUP A and MRSA 12/10  Urine >>>NTD 03/29/12 - C Diff - neg 04/01/12 CT CHEST - Rt LOCULATED SMALL PLEURAL EFFUSIN   Lab 03/30/12 0518 03/29/12 0530 03/28/12 2250  PROCALCITON 0.49 0.82 0.30      ANTIBIOTICS: Zosyn 12/10 >>>12/15 Vanco 12/10 >>>12/15 Clindamycin  12/15 (strep and staph) >> (with a view towards taking it po)(po 12-16)>>  SIGNIFICANT EVENTS:  12/10  Brought to [facility] ED unresponsive, did not improve with Narcan, intubated for airway protection. 04/01/2012: Resp cultuers positive. Normal WUA. Did SBT all day 12-16 x to floor   SUBJECTIVE/OVERNIGHT/INTERVAL HX 04/03/12 -  In chair     VITAL SIGNS: Temp:  [96.7 F (35.9 C)-98.4 F (36.9 C)] 98.4 F (36.9 C) (12/16 0800) Pulse Rate:  [80-102] 93  (12/16 0930) Resp:  [18-30] 21  (12/16 0930) BP: (132-167)/(56-84) 138/56 mmHg (12/16 0930) SpO2:  [89 %-100 %] 90 % (12/16 0930)      INTAKE / OUTPUT: Intake/Output      12/15 0701 - 12/16 0700 12/16 0701 - 12/17 0700   P.O. 120    I.V. (mL/kg) 14.2 (0.1)    Other 240 40   NG/GT     IV Piggyback 650 17   Total Intake(mL/kg) 1024.2 (8.4) 57 (0.5)   Urine (mL/kg/hr) 1700 (0.6)    Total Output 1700    Net -675.8 +57        Urine Occurrence 5 x 1 x   Stool Occurrence 2 x       Intake/Output Summary (Last 24 hours) at 04/03/12 1008 Last data filed at 04/03/12 0919  Gross per 24 hour  Intake    917 ml  Output    475 ml  Net    442 ml   PHYSICAL EXAMINATION: General:  Morbidly obese,sitting in chair Neuro:  Follows commands, alert,  HEENT:  PERRL, no jvd Cardiovascular:  RRR, distant heart sounds, no m/r/g. Lungs:  Bilateral diminished air entry, no w/r/r, surgical absence of the R breast. Abdomen:  Obese. Musculoskeletal:  No edema. Skin:  Intact.  LABS:  Lab 04/03/12 0540 04/02/12 0729 04/01/12 0434 04/01/12 0331 03/31/12 0518 03/31/12 0452 03/30/12 0518 03/30/12 0435 03/29/12 0530 03/28/12 2250 03/28/12 1954  HGB 8.9* 9.4* -- 8.2* -- -- -- -- -- -- --  WBC 9.4 10.0 -- 11.2* -- -- -- -- -- -- --  PLT 338 331 -- 329 -- -- -- -- -- -- --  NA 137 145 -- 143 -- -- -- -- -- -- --  K 3.3* 2.9* -- -- -- -- -- -- -- -- --  CL 91* 95* -- 101 -- -- -- -- -- -- --  CO2 35* 38* -- 34* -- -- -- -- -- -- --  GLUCOSE 327* 138* --  151* -- -- -- -- -- -- --  BUN 47* 36* -- 24* -- -- -- -- -- -- --  CREATININE 1.31* 1.31* -- 1.13* -- -- -- -- -- -- --  CALCIUM 9.3 10.1 -- 9.8 -- -- -- -- -- -- --  MG -- 2.0 -- 2.0 -- 2.1 -- -- -- -- --  PHOS 4.8* 5.5* -- 2.9 -- -- -- -- -- -- --  AST -- -- -- -- -- -- -- -- -- -- 37  ALT -- -- -- -- -- -- -- -- -- -- 16  ALKPHOS -- -- -- -- -- -- -- -- -- -- 67  BILITOT -- -- -- -- -- -- -- -- -- -- 0.2*  PROT -- -- -- -- -- -- -- -- -- -- 7.4  ALBUMIN -- -- -- -- -- -- -- -- -- -- 2.6*  APTT -- -- -- -- -- -- -- -- -- -- --  INR -- -- -- -- -- -- -- -- -- -- --  LATICACIDVEN -- -- -- -- -- -- -- -- -- 1.0 --  TROPONINI -- -- -- -- -- -- -- -- -- -- <0.30  PROCALCITON -- -- -- -- -- -- 0.49 -- 0.82 0.30 --  PROBNP -- 441.0* -- -- -- -- -- -- -- -- --  O2SATVEN -- -- -- -- -- -- -- -- -- -- --  PHART -- -- 7.494* -- 7.414 -- -- 7.357 -- -- --  PCO2ART -- -- 45.5* -- 44.6 -- -- 44.1 -- -- --  PO2ART -- -- 72.1* -- 80.8 -- --  118.0* -- -- --    Lab 04/03/12 0756 04/02/12 2143 04/02/12 2021 04/02/12 1629 04/02/12 1204  GLUCAP 280* 356* 364* 229* 208*   IMAGING: 12/10  Head CT >>> nad, possible old infarct in the right pons 12/10  CXR >>> Low ETT, small right effusion  Ct Chest Wo Contrast  04/01/2012  *RADIOLOGY REPORT*  Clinical Data: Acute respiratory failure.  On ventilator.  Right pleural effusion.  Possible empyema.  CT CHEST WITHOUT CONTRAST  Technique:  Multidetector CT imaging of the chest was performed following the standard protocol without IV contrast.  Comparison: 01/27/2012  Findings: Mild increase in size of a small right pleural effusion is seen which is a lobulated likely due to loculations.  Mild peripheral compressive atelectasis noted mainly in the lower lobe, however there is no evidence of pulmonary consolidation or mass.  Shotty mediastinal lymphadenopathy is seen but decreased since previous exam.  Tiny pericardial effusion versus pericardial thickening is stable.  IMPRESSION:  1.  Mild increase in size of small multiloculated right pleural effusion. 2.  Stable tiny pericardial effusion versus pericardial thickening. 3.  Decreased mediastinal lymphadenopathy since prior exam.   Original Report Authenticated By: Myles Rosenthal, M.D.    Dg Chest Port 1 View  04/02/2012  *RADIOLOGY REPORT*  Clinical Data: Check endotracheal tube  PORTABLE CHEST - 1 VIEW  Comparison: Chest radiograph 04/01/2012  Findings: Left power port and endotracheal tube are unchanged. Right-sided pacemaker noted.  Stable cardiac silhouette.  There is a partially loculated fluid in the right hemithorax unchanged from prior.  There is a fine interstitial edema pattern unchanged.  No pneumothorax.  IMPRESSION:  1.  Stable support apparatus. 2.  No interval change. 3.  Loculated right pleural fluid and interstitial edema pattern.   Original Report Authenticated By: Genevive Bi, M.D.      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  OSA.  OHS.  COPD without exacerbation. History of RLL lobectomy for carcinoid tumor.  VDRF due to strep and MRSA PNA AND rt loculated effusion    P:   - Aggressive pulm toilet post extubation  - Doubt empyema, not amenable to chest tube or VATS - too small, probably best to monitor resolution clinically - use cpap qhs (diagnosis of baseline OSA and uses cpap at home) -resume advair/ spiriva on dc -dc solumedrol, prednisone 40 -taper over 2 weeks or sooner based on improvement Sees Dr Maple Hudson, has 2l [place] at home  CARDIOVASCULAR  Lab 04/02/12 0729  PROBNP 441.0*      Lab 03/28/12 1954  TROPONINI <0.30     A:  Diast CHF without exacerbation.- 2D echo ordered. Ef 65%.  P:  - Goal MAP>60. - - resume Norvasc, Coreg 6.25 , Aggrenox, Avapro, Crestor,   -Need clarification on Diovan vs avapro (both ARBs)  RENAL  Intake/Output Summary (Last 24 hours) at 04/03/12 1008 Last data filed at 04/03/12 0919  Gross per 24 hour  Intake    917 ml  Output    475 ml  Net    442 ml    Lab 04/03/12 0540 04/02/12 0729 04/01/12 0331 03/31/12 2200 03/31/12 0452 03/30/12 0518 03/29/12 0530  NA 137 145 143 143 144 -- --  K 3.3* 2.9* -- -- -- -- --  CL 91* 95* 101 101 108 -- --  CO2 35* 38* 34* 33* 28 -- --  GLUCOSE 327* 138* 151* 235* 297* -- --  BUN 47* 36* 24* 24* 24* -- --  CREATININE 1.31* 1.31* 1.13* 1.08 1.07 -- --  CALCIUM 9.3 10.1 9.8 9.5 8.9 -- --  MG -- 2.0 2.0 -- 2.1 2.3 2.3  PHOS 4.8* 5.5* 2.9 -- 2.8 3.4 --    Lab Results  Component Value Date   CREATININE 1.31* 04/03/2012    CREATININE 1.31* 04/02/2012   CREATININE 1.13* 04/01/2012   CREATININE 1.2* 03/24/2012   CREATININE 1.01 03/22/2012   CREATININE 0.9 03/10/2012   CREATININE 1.0 01/24/2012   CREATININE 0.92 09/30/2011   CREATININE 0.80 08/25/2011    Lab 04/03/12 0540 04/02/12 0729 04/01/12 0331  K 3.3* 2.9* 3.5      A: AKI and Hyperkalemia- resolved   P:   - off lasix - replete k as needed - monitor bnp and renal function   GASTROINTESTINAL  A:  Morbid obesity. P:   advance diet to low carb  HEMATOLOGIC  Lab 04/03/12 0540 04/02/12 0729 04/01/12 0331  HGB 8.9* 9.4* 8.2*  HCT 32.0* 33.4* 29.0*  WBC 9.4 10.0 11.2*  PLT 338 331 329    A:  Chronic anemia, stable.  Breast CA. P:  - PRBC for hgb </= 6.9gm%    -   INFECTIOUS  A: Immunocompromised.  Resp cultures with beta hemoytic strep (not group A) and  MRSA Small Rt empyema on CT 04/01/12  P:   Narrow abx as above Monitor empyema clinically; doubt role for chest tube v surgery but will have to redecide depending on clinical course   ENDOCRINE  CBG (last 3)   Basename 04/03/12 0756 04/02/12 2143 04/02/12 2021  GLUCAP 280* 356* 364*   A:  DM.  Hyperglycemia. P:   DC ICU hyperglycemia  ssi, added lantus 15  - increase to 25  Hold Metformin, Januvia.   NEUROLOGIC  A:  Acute encephalopathy post intubtin initiallu.  Chronic pain. Resolved 12-16  On 04/03/12: Awake and follows commands. Wants to go to floor and home  P:   resume Oxycodone  / Lyrica; consider restat 04/03/12   Transfer to floor  Cyril Mourning MD. FCCP. [facility] Pager [phone] If no response call [phone]

## 2012-04-03 NOTE — Progress Notes (Signed)
[id number] Earlene Plater, RN, BSN, CCM: CHART REVIEWED AND UPDATED.  Next chart review due on [id number]. NO DISCHARGE NEEDS PRESENT AT THIS TIME.  Extubated am of [id number], will be transferred to floor. Patient request to go home. CASE MANAGEMENT [phone]

## 2012-04-03 NOTE — Progress Notes (Signed)
Inpatient Diabetes Program Recommendations  AACE/ADA: New Consensus Statement on Inpatient Glycemic Control (2013)  Target Ranges:  Prepandial:   less than 140 mg/dL      Peak postprandial:   less than 180 mg/dL (1-2 hours)      Critically ill patients:  140 - 180 mg/dL   Reason for Visit: Hyperglycemia  Pt transferred from ICU.  Eating well per RN.  Results for Melissa Villa, Melissa Villa (MRN [id number]) as of 04/03/2012 15:49  Ref. Range 04/03/2012 05:40  Sodium Latest Range: 136-145 mEq/L 137  Potassium Latest Range: 3.5-5.1 mEq/L 3.3 (L)  Chloride Latest Range: 96-112 mEq/L 91 (L)  CO2 Latest Range: 19-32 mEq/L 35 (H)  BUN Latest Range: 7.0-26.0 mg/dL 47 (H)  Creatinine Latest Range: 0.50-1.10 mg/dL 4.09 (H)  Calcium Latest Range: 8.4-10.5 mg/dL 9.3  GFR calc non Af Amer Latest Range: >90 mL/min 42 (L)  GFR calc Af Amer Latest Range: >90 mL/min 49 (L)  Glucose Latest Range: 70-99 mg/dl 811 (H)  Phosphorus Latest Range: 2.3-4.6 mg/dL 4.8 (H)  Results for Melissa Villa, Melissa Villa (MRN [id number]) as of 04/03/2012 15:49  Ref. Range 04/02/2012 16:29 04/02/2012 20:21 04/02/2012 21:43 04/03/2012 07:56 04/03/2012 11:52  Glucose-Capillary Latest Range: 70-99 mg/dL 213 (H) 086 (H) 578 (H) 280 (H) 435 (H)    Inpatient Diabetes Program Recommendations Insulin - Basal: Increase Lantus to home dose of 42 units QHS Insulin - Meal Coverage: Add meal coverage - Novolog 6 units tidwc if pt eats >50% meal  Note: Will follow.  Thank you.

## 2012-04-03 NOTE — Progress Notes (Signed)
Placed pt on CPAP via full face mask home settings of 13.0 cm H20 with 2 lpm O2 bleed in.  Pt. Tolerating well at this time. RN aware.

## 2012-04-03 NOTE — Evaluation (Signed)
Clinical/Bedside Swallow Evaluation Patient Details  Name: Melissa Villa MRN: [id number] Date of Birth: [birth year]  Today's Date: 04/03/2012 Time: 4098-1191 SLP Time Calculation (min): 36 min  Past Medical History:  Past Medical History  Diagnosis Date  . CHF (congestive heart failure)      2-D echo 02/19/2009 showed left ventricle cavity size normal; systolic function normal, estimated ejection fraction 55%; wall motion normal; no regional wall motion abnormalities; PA peak pressure 47mm Hg.  [name] COPD (chronic obstructive pulmonary disease)     Pulmonary function tests on 08/05/2010 showed mixed obstructive and restrictive lung disease with no significant response to bronchodilator, and decreased diffusion capacity that corrects to normal range when adjusted for the inhaled alveolar volume.  . Degenerative joint disease   . AV block, complete     S/P dual-chamber for symptomatic episodes of bradycardia and complete heart block.  . Pacemaker     S/P dual-chamber for symptomatic episodes of bradycardia and complete heart block.  . Adenomatous polyp of colon     Tubular adenomas X 2 found 06/1999; negative screening colonoscopy 02/13/2004 by Dr. [name].  . Hypercholesteremia   . Obstructive sleep apnea     Baseline diagnostic nocturnal polysomnogram on July 27, 2005 showed severe obstructive sleep apnea/hypopnea syndrome, with AHI 153.2 per hour.  Last nocturnal polysomnogram done for CPAP titration was 05/01/2009; CPAP was titrated to 13 CWP, AHI 1.8 per hour using a large Respironics FitLife Full Face Mask with heated humidifier.  . Peripheral autonomic neuropathy due to diabetes mellitus   . 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 [name]  . Constipation   . Hand pain   . Palpitation   . Rib pain     right sided   . DM type 2 (diabetes mellitus, type 2)   . Skin rash   . Urinary incontinence   . 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 [name]  . Vitreous hemorrhage     With tractional detachment, left eye; S/P posterior vitrectomy with membrane peel by Dr. [name] 04/06/2005  . Dyspnea on exertion     Cardiac cath 06/17/2005 by Dr. [name] showed LVEF 50-55%, 30% proximal LAD stenosis, small diagonals.   . Shingles rash 03/25/11  . 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 Followed by Dr. [name].  . Breast cancer, stage 3 102/13    right hx lumpectomy 1994, bx today=invasive ductal ca  . Allergy     thinitis  . Asthma   . Bronchitis     hx  . Cataract     b/l surgery  . DM hyperosmolarity type II   . Hypertension   . COPD (chronic obstructive pulmonary disease)   . Diastolic CHF   . TIA (transient ischemic attack)   . OSA (obstructive sleep apnea)   . Chronic respiratory failure with hypoxia   . Anemia   . DJD (degenerative joint disease)   . AV block   . DM retinopathy   . DM neuropathies   . Carcinoid tumor of lung  Past Surgical History:  Past Surgical History  Procedure Date  . 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  Patient is followed by Dr. [name] at the cancer center.  . Lung lobectomy 06/13/2001    S/P right lower lobe lobectomy 06/13/2001 by  Dr. [name]  . Pacemaker insertion 04/20/2001  . Mastectomy partial / lumpectomy   . Lobectomy   . Pacemaker insertion   . Cholecystectomy   . Tonsillectomy    HPI:  66 yo with OSA/OHS, on chronic opioids brought to [facility] ED unresponsive, did not improve with Narcan, intubated for airway protection.  PMH: Recurrent breast cancer; lung Ca., TIA.   Assessment / Plan / Recommendation Clinical Impression  Patient exhibits normal swallow function at bedside, with no overt s/s of aspiration or dysphagia.    Aspiration Risk  None    Diet Recommendation Regular;Thin liquid   Liquid Administration via: Cup;Straw Medication Administration: Whole meds with liquid Supervision: Patient able to self feed Compensations: Slow rate;Small sips/bites Postural Changes and/or Swallow Maneuvers: Out of bed for meals;Seated upright 90 degrees    Other  Recommendations Oral Care Recommendations: Oral care BID Other Recommendations: Clarify dietary restrictions (Carb. Modified)   Follow Up Recommendations  None    Frequency and Duration        Pertinent Vitals/Pain n/a    SLP Swallow Goals     Swallow Study Prior Functional Status  Type of Home: House Lives With: Alone Available Help at Discharge: Family;Available PRN/intermittently    General HPI: 66 yo with OSA/OHS, on chronic opioids brought to [facility] ED unresponsive, did not improve with Narcan, intubated for airway protection.  PMH: Recurrent breast cancer; lung Ca., TIA. Type of Study: Bedside swallow evaluation Previous Swallow Assessment: N/A Diet Prior to this Study: Regular;Thin liquids Temperature Spikes Noted: No Respiratory Status: Supplemental O2 delivered via (comment) History of Recent Intubation: Yes Length of Intubations (days): 5 days Date extubated: 04/02/12 Behavior/Cognition: Alert;Cooperative;Pleasant mood Oral Cavity - Dentition: Dentures, top;Dentures, bottom Self-Feeding Abilities: Able to feed self Patient  Positioning: Upright in chair Baseline Vocal Quality: Clear Volitional Cough: Strong Volitional Swallow: Able to elicit    Oral/Motor/Sensory Function Overall Oral Motor/Sensory Function: Appears within functional limits for tasks assessed   Ice Chips Ice chips: Within functional limits Presentation: Spoon   Thin Liquid Thin Liquid: Within functional limits Presentation: Cup;Straw    Nectar Thick Nectar Thick Liquid: Not tested   Honey Thick Honey Thick Liquid: Not tested   Puree Puree: Within functional limits   Solid   GO    Solid: Within functional limits Presentation: Self Daine Gravel, Gerard Bonus T 04/03/2012,3:54 PM

## 2012-04-04 LAB — CULTURE, BLOOD (ROUTINE X 2): Culture: NO GROWTH

## 2012-04-04 LAB — CBC
HCT: 31.7 % — ABNORMAL LOW (ref 36.0–46.0)
Platelets: 326 10*3/uL (ref 150–400)
RDW: 22.1 % — ABNORMAL HIGH (ref 11.5–15.5)
WBC: 8.6 10*3/uL (ref 4.0–10.5)

## 2012-04-04 LAB — BASIC METABOLIC PANEL
Chloride: 95 mEq/L — ABNORMAL LOW (ref 96–112)
GFR calc Af Amer: 47 mL/min — ABNORMAL LOW (ref >90)
Potassium: 3.1 mEq/L — ABNORMAL LOW (ref 3.5–5.1)

## 2012-04-04 LAB — GLUCOSE, CAPILLARY: Glucose-Capillary: 400 mg/dL — ABNORMAL HIGH (ref 70–99)

## 2012-04-04 MED ORDER — INSULIN GLARGINE 100 UNIT/ML ~~LOC~~ SOLN
42.0000 [IU] | Freq: Every day | SUBCUTANEOUS | Status: DC
  Administered 2012-04-04: 42 [IU] via SUBCUTANEOUS

## 2012-04-04 MED ORDER — HYDROMORPHONE HCL PF 1 MG/ML IJ SOLN
1.0000 mg | INTRAMUSCULAR | Status: DC | PRN
  Administered 2012-04-04 – 2012-04-05 (×3): 1 mg via INTRAVENOUS
  Filled 2012-04-04 (×3): qty 1

## 2012-04-04 MED ORDER — POTASSIUM CHLORIDE CRYS ER 20 MEQ PO TBCR
40.0000 meq | EXTENDED_RELEASE_TABLET | Freq: Once | ORAL | Status: AC
  Administered 2012-04-04: 40 meq via ORAL
  Filled 2012-04-04: qty 2

## 2012-04-04 MED ORDER — INSULIN ASPART 100 UNIT/ML ~~LOC~~ SOLN
7.0000 [IU] | Freq: Once | SUBCUTANEOUS | Status: AC
  Administered 2012-04-05: 7 [IU] via SUBCUTANEOUS

## 2012-04-04 NOTE — Progress Notes (Addendum)
Patient ID: Melissa Villa, female   DOB: 02-10-1946, 66 y.o.   MRN: [id number]  [facility] PROGRESS NOTE  Melissa Villa:[id number] DOB: Sep 20, 1945 DOA: 03/28/2012 PCP: [name], MD  Brief narrative: 66 yo with OSA/OHS, on chronic opioids brought to [facility] ED unresponsive, did not improve with Narcan, intubated for airway protection.  Active Problems:  Acute respiratory failure  - intubated for inability to protect airway. OSA. OHS. COPD without exacerbation.  - history of RLL lobectomy for carcinoid tumor. VDRF due to strep and MRSA PNA AND rt loculated effusion  - Aggressive pulm toilet post extubation  - per PCCM probably best to monitor resolution clinically  - use CPAP QHS - resume advair/ spiriva on dc  - discontinued solumedrol, prednisone 40 mg PO QD and taper over 2 week period   Congestive heart failure, diastolic - 2D echo ordered. Ef 65%.  - Goal MAP >60.  - resumed Norvasc, Coreg 6.25 , Aggrenox, Avapro, Crestor  DIABETES MELLITUS, TYPE II - will increase Lantus to home dose 42 units QHS - plan on adding Novolog if still not controlled  ANEMIA-IRON DEFICIENCY - Hg at baseline 8-9, remains stable and at baseline - CBC in AM  OBSTRUCTIVE SLEEP APNEA - CPAP at night  Acute renal failure - creatinine is staying around her baseline - close monitor with daily BMP  SIGNIFICANT EVENTS:  12/10 Brought to [facility] ED unresponsive, did not improve with Narcan, intubated for airway protection.  04/01/2012: Resp cultuers positive. Normal WUA. Did SBT all day  12-16 x to floor adn [facility] to take over 12/17  Procedures/Studies: LINES / TUBES:  12/10 OETT>>>12/15  12/10 OGT>>>12/15  12/10 Foley>>>  Left chest Medi-Port>>>  CULTURES:  12/10 - MRSA PCr - neg  12/10 Blood >>>NTD  12/11 Sputum >>> MODERATE STREPTOCOCCUS,BETA HEMOLYIC NOT GROUP A and MRSA 12/10 Urine >>>NTD  03/29/12 - C Diff - neg  04/01/12 CT CHEST - Rt LOCULATED SMALL PLEURAL EFFUSIN    ANTIBIOTICS:  Zosyn 12/10 >>>12/15  Vanco 12/10 >>>12/15  Clindamycin 12/15 (strep and staph) >> (with a view towards taking it po)(po 12-16)>>   Code Status: PAR Family Communication: Pt at bedside Disposition Plan: Home when medically stable  HPI/Subjective: No events overnight.   Objective: Filed Vitals:   04/04/12 0545 04/04/12 0637 04/04/12 0818 04/04/12 1430  BP: 135/60   128/64  Pulse: 72   70  Temp: 97.5 F (36.4 C)   98.4 F (36.9 C)  TempSrc: Axillary   Oral  Resp: 21   16  Height:      Weight:      SpO2: 90% 95% 94% 93%    Intake/Output Summary (Last 24 hours) at 04/04/12 1947 Last data filed at 04/04/12 1519  Gross per 24 hour  Intake   1050 ml  Output    175 ml  Net    875 ml    Exam:   General:  Pt is alert, follows commands appropriately, not in acute distress, obese   Cardiovascular: Regular rate and rhythm, S1/S2, no murmurs, no rubs, no gallops  Respiratory: Diminished air entry bilaterally  Abdomen: Soft, non tender, non distended, bowel sounds present, no guarding  Extremities: No edema, pulses DP and PT palpable bilaterally  Neuro: Grossly nonfocal  Data Reviewed: Basic Metabolic Panel:  Lab 04/04/12 9562 04/03/12 0540 04/02/12 0729 04/01/12 0331 03/31/12 2200 03/31/12 0452 03/30/12 0518 03/29/12 0530  NA 138 137 145 143 143 -- -- --  K 3.1* 3.3*  2.9* 3.5 3.3* -- -- --  CL 95* 91* 95* 101 101 -- -- --  CO2 34* 35* 38* 34* 33* -- -- --  GLUCOSE 139* 327* 138* 151* 235* -- -- --  BUN 47* 47* 36* 24* 24* -- -- --  CREATININE 1.35* 1.31* 1.31* 1.13* 1.08 -- -- --  CALCIUM 9.3 9.3 10.1 9.8 9.5 -- -- --  MG -- -- 2.0 2.0 -- 2.1 2.3 2.3  PHOS -- 4.8* 5.5* 2.9 -- 2.8 3.4 --   Liver Function Tests:  Lab 03/28/12 1954  AST 37  ALT 16  ALKPHOS 67  BILITOT 0.2*  PROT 7.4  ALBUMIN 2.6*   CBC:  Lab 04/04/12 0600 04/03/12 0540 04/02/12 0729 04/01/12 0331 03/31/12 0452 03/28/12 1954  WBC 8.6 9.4 10.0 11.2* 8.8 --  NEUTROABS -- --  -- -- -- 13.5*  HGB 8.7* 8.9* 9.4* 8.2* 7.7* --  HCT 31.7* 32.0* 33.4* 29.0* 27.3* --  MCV 73.7* 73.1* 72.5* 73.2* 73.2* --  PLT 326 338 331 329 277 --   Cardiac Enzymes:  Lab 03/28/12 1954  CKTOTAL --  CKMB --  CKMBINDEX --  TROPONINI <0.30   CBG:  Lab 04/04/12 1150 04/04/12 0757 04/03/12 2059 04/03/12 1626 04/03/12 1152  GLUCAP 332* 150* 169* 180* 435*    Recent Results (from the past 240 hour(s))  CULTURE, BLOOD (ROUTINE X 2)     Status: Normal   Collection Time   03/28/12  8:20 PM      Component Value Range Status Comment   Specimen Description BLOOD LEFT THUMB   Final    Special Requests BOTTLES DRAWN AEROBIC ONLY   Final    Culture  Setup Time 03/29/2012 01:47   Final    Culture NO GROWTH 5 DAYS   Final    Report Status 04/04/2012 FINAL   Final   CULTURE, BLOOD (ROUTINE X 2)     Status: Normal   Collection Time   03/28/12  8:30 PM      Component Value Range Status Comment   Specimen Description BLOOD PORTA CATH   Final    Special Requests BOTTLES DRAWN AEROBIC AND ANAEROBIC [facility] EACH   Final    Culture  Setup Time 03/29/2012 01:48   Final    Culture NO GROWTH 5 DAYS   Final    Report Status 04/04/2012 FINAL   Final   URINE CULTURE     Status: Normal   Collection Time   03/28/12 10:51 PM      Component Value Range Status Comment   Specimen Description URINE, CATHETERIZED   Final    Special Requests NONE   Final    Culture  Setup Time 03/29/2012 08:33   Final    Colony Count NO GROWTH   Final    Culture NO GROWTH   Final    Report Status 03/30/2012 FINAL   Final   MRSA PCR SCREENING     Status: Normal   Collection Time   03/28/12 11:44 PM      Component Value Range Status Comment   MRSA by PCR NEGATIVE  NEGATIVE Final   CULTURE, RESPIRATORY     Status: Normal   Collection Time   03/29/12  1:42 AM      Component Value Range Status Comment   Specimen Description TRACHEAL ASPIRATE   Final    Special Requests NONE   Final    Gram Stain     Final     Value:  FEW WBC PRESENT,BOTH PMN AND MONONUCLEAR     RARE SQUAMOUS EPITHELIAL CELLS PRESENT     RARE GRAM POSITIVE COCCI IN CLUSTERS     IN PAIRS   Culture     Final    Value: MODERATE STREPTOCOCCUS,BETA HEMOLYIC NOT GROUP A     MODERATE METHICILLIN RESISTANT STAPHYLOCOCCUS AUREUS     Note: RIFAMPIN AND GENTAMICIN SHOULD NOT BE USED AS SINGLE DRUGS FOR TREATMENT OF STAPH INFECTIONS. CRITICAL RESULT CALLED TO, READ BACK BY AND VERIFIED WITH: GARLAND RN 845PM 04/01/12 GUSTK   Report Status 04/01/2012 FINAL   Final    Organism ID, Bacteria METHICILLIN RESISTANT STAPHYLOCOCCUS AUREUS   Final   CLOSTRIDIUM DIFFICILE BY PCR     Status: Normal   Collection Time   03/29/12  7:31 AM      Component Value Range Status Comment   C difficile by pcr NEGATIVE  NEGATIVE Final      Scheduled Meds:   . ipratropium  0.5 mg Nebulization Q6H  . albuterol  2.5 mg Nebulization Q6H  . amLODipine  10 mg Oral Daily  . budesonide  0.5 mg Nebulization BID  . carvedilol  6.25 mg Oral BID  . clindamycin  300 mg Oral Q6H  . dipyridamole-aspirin  1 capsule Oral BID  . heparin subcutaneous  5,000 Units Subcutaneous Q8H  . insulin aspart  0-20 Units Subcutaneous TID WC  . insulin glargine  30 Units Subcutaneous QHS  . mupirocin ointment  1 application Nasal BID  . pantoprazole  40 mg Oral Daily  . predniSONE  40 mg Oral Q breakfast  . pregabalin  200 mg Oral BID   Continuous Infusions:    Debbora Presto, MD  [facility] Pager [phone]  If 7PM-7AM, please contact night-coverage [email or web address] Password [email or web address] 04/04/2012, 7:47 PM   LOS: 7 days

## 2012-04-04 NOTE — Progress Notes (Signed)
PT Cancellation Note  Patient Details Name: Melissa Villa MRN: [id number] DOB: November 09, 1945   Cancelled Treatment:    Reason Eval/Treat Not Completed: Fatigue/lethargy limiting ability to participate (per pt.)   Rada Hay 04/04/2012, 2:50 PM

## 2012-04-04 NOTE — Progress Notes (Signed)
Talked to patient about DCP; Lives alone, Pulmonologist is Dr Maple Hudson; Patient has home 02, CPAP machine and nebulizer machine at home that is supplied by [facility] but patient does not want their services anymore; [facility] has contacted [facility] and they will assume the responsibility for the home 02 per Larita Fife [phone]); Before they can deliver the home 02, they are requesting a room air sat to be call to them 2 days prior to discharge. The patient has contacted [facility] to pick up their equipment; Patient is agreeable to Home Health Care at discharge and chose [facility] [phone]) [facility] agency contacted and stated that they will accept her insurance; Attending MD please order Disease Management Program for CHF/COPD at discharge; HHRN/ PT/ nurses aide; Alexis Goodell [phone]

## 2012-04-05 ENCOUNTER — Ambulatory Visit: Payer: Self-pay | Admitting: Internal Medicine

## 2012-04-05 ENCOUNTER — Ambulatory Visit: Payer: Self-pay | Admitting: Dietician

## 2012-04-05 LAB — BASIC METABOLIC PANEL
BUN: 41 mg/dL — ABNORMAL HIGH (ref 6–23)
Calcium: 9.1 mg/dL (ref 8.4–10.5)
Creatinine, Ser: 1.21 mg/dL — ABNORMAL HIGH (ref 0.50–1.10)
GFR calc Af Amer: 54 mL/min — ABNORMAL LOW (ref >90)
GFR calc non Af Amer: 46 mL/min — ABNORMAL LOW (ref >90)
Potassium: 3.6 mEq/L (ref 3.5–5.1)

## 2012-04-05 LAB — CBC
HCT: 29.6 % — ABNORMAL LOW (ref 36.0–46.0)
MCH: 20.5 pg — ABNORMAL LOW (ref 26.0–34.0)
MCHC: 28.4 g/dL — ABNORMAL LOW (ref 30.0–36.0)
MCV: 72.4 fL — ABNORMAL LOW (ref 78.0–100.0)
RDW: 21.9 % — ABNORMAL HIGH (ref 11.5–15.5)

## 2012-04-05 LAB — GLUCOSE, CAPILLARY

## 2012-04-05 MED ORDER — PREDNISONE 20 MG PO TABS: ORAL_TABLET | ORAL | Status: DC

## 2012-04-05 MED ORDER — OXYCODONE HCL 5 MG PO TABS: 5.0000 mg | ORAL_TABLET | Freq: Four times a day (QID) | ORAL | Status: DC | PRN

## 2012-04-05 MED ORDER — FLUTICASONE PROPIONATE 50 MCG/ACT NA SUSP
2.0000 | Freq: Every day | NASAL | Status: DC
  Administered 2012-04-05: 2 via NASAL
  Filled 2012-04-05: qty 16

## 2012-04-05 MED ORDER — CLINDAMYCIN HCL 300 MG PO CAPS: 300.0000 mg | ORAL_CAPSULE | Freq: Four times a day (QID) | ORAL | Status: DC

## 2012-04-05 MED ORDER — HEPARIN SOD (PORK) LOCK FLUSH 100 UNIT/ML IV SOLN
500.0000 [IU] | Freq: Once | INTRAVENOUS | Status: DC
  Filled 2012-04-05: qty 5

## 2012-04-05 MED ORDER — BUDESONIDE 0.5 MG/2ML IN SUSP: 0.5000 mg | Freq: Two times a day (BID) | RESPIRATORY_TRACT | Status: AC

## 2012-04-05 MED ORDER — LORATADINE 10 MG PO TABS
10.0000 mg | ORAL_TABLET | Freq: Every day | ORAL | Status: DC
  Administered 2012-04-05: 10 mg via ORAL
  Filled 2012-04-05: qty 1

## 2012-04-05 MED ORDER — ALBUTEROL SULFATE (2.5 MG/3ML) 0.083% IN NEBU: 2.5000 mg | INHALATION_SOLUTION | Freq: Four times a day (QID) | RESPIRATORY_TRACT | Status: DC | PRN

## 2012-04-05 NOTE — Progress Notes (Signed)
Received call from [facility] home health care, they cannot accept the patient because "they have their quota of [facility] patient's and cannot accept anymore. [facility] Care called, cannot accept patient. Talked to the patient and patient agreed to have [facility] for home care service, Norberta Keens RN with [facility] called for arrangements. Abelino Derrick RN,BSN,MHA

## 2012-04-05 NOTE — Progress Notes (Signed)
Patient is to be discharged home today; [facility] called for home [facility], they will deliver 02 to the patient's room and more 02 tanks to the patient's home. HHC orders faxed to [facility] as patient requested. Abelino Derrick RN,BSN,MHA

## 2012-04-05 NOTE — Progress Notes (Addendum)
Oxygen saturation checked when patient on room air at 85%. When patient is placed back on 2.5 liters via nasal cannula her oxygen saturation came back up to 92%. Will continue to monitor. Erskin Burnet RN

## 2012-04-05 NOTE — Discharge Summary (Signed)
Physician Discharge Summary  Melissa Villa:[id number] DOB: 10-Nov-1945 DOA: 03/28/2012  PCP: [name], MD  Admit date: 03/28/2012 Discharge date: 04/05/2012  Recommendations for Outpatient Follow-up:  1. Pt will need to follow up with PCP in 2-3 weeks post discharge 2. Please obtain BMP to evaluate electrolytes and kidney function 3. Please also check CBC to evaluate Hg and Hct levels 4. Please note that pt was discharged on Clindamycin to complete the therapy for 5 more days post discharge, stop date 04/10/2012  5. Pt was also discharged on 2 week Prednisone taper pack as recommended by PCCM  Discharge Diagnoses: Ventilator dependent respiratory failure secondary to MRSA PNA, right side loculated pleural effusion, OSA Active Problems:  DIABETES MELLITUS, TYPE II  ANEMIA-IRON DEFICIENCY  OBSTRUCTIVE SLEEP APNEA  AV BLOCK, COMPLETE  CONGESTIVE HEART FAILURE  COPD  Pacemaker-St.Jude  Breast cancer, stage 3  Acute respiratory failure  Acute encephalopathy  Acute renal failure  Discharge Condition: Stable  Diet recommendation: Heart healthy diet discussed in details   Brief narrative:  66 yo with OSA/OHS, on chronic opioids brought to [facility] ED unresponsive, did not improve with Narcan, intubated for airway protection.  Active Problems:  Acute respiratory failure  - intubated for inability to protect airway. OSA. OHS. COPD without exacerbation.  - history of RLL lobectomy for carcinoid tumor. VDRF due to strep and MRSA PNA AND rt loculated effusion  - Aggressive pulm toilet post extubation  - per PCCM probably best to monitor resolution clinically  - use CPAP QHS  - will resume advair/ spiriva on dc  - discontinued solumedrol, prednisone 40 mg PO QD and taper over 2 week period, prescriptions provided   Congestive heart failure, diastolic  - 2D echo ordered. EF 65%.  - Goal MAP >60.  - resumed Norvasc, Coreg 6.25 , Aggrenox, Avapro, Crestor  DIABETES MELLITUS,  TYPE II  - will increase Lantus to home dose 42 units QHS  - CBG's remained stable ANEMIA-IRON DEFICIENCY  - Hg at baseline 8-9, remains stable and at baseline  OBSTRUCTIVE SLEEP APNEA  - CPAP at night provided  Acute renal failure  - creatinine is staying around her baseline and even trending down slightly   SIGNIFICANT EVENTS:  12/10 Brought to [facility] ED unresponsive, did not improve with Narcan, intubated for airway protection.  04/01/2012: Resp cultuers positive. Normal WUA. Did SBT all day  12-16 x to floor adn TRH to take over 12/17  Procedures/Studies:  LINES / TUBES:  12/10 OETT>>>12/15  12/10 OGT>>>12/15  12/10 Foley>>>  Left chest Medi-Port>>>  CULTURES:  12/10 - MRSA PCr - neg  12/10 Blood >>>NTD  12/11 Sputum >>> MODERATE STREPTOCOCCUS,BETA HEMOLYIC NOT GROUP A and MRSA 12/10 Urine >>>NTD  03/29/12 - C Diff - neg  04/01/12 CT CHEST - Rt LOCULATED SMALL PLEURAL EFFUSIN  ANTIBIOTICS:  Zosyn 12/10 >>>12/15  Vanco 12/10 >>>12/15  Clindamycin 12/15 (strep and staph) >> (with a view towards taking it po)(po 12-16)>>  Five more days post discharge and stop date 04/10/2012  Code Status: PAR  Family Communication: Pt at bedside   Discharge Exam: Filed Vitals:   04/05/12 0532  BP: 148/67  Pulse: 88  Temp: 97.9 F (36.6 C)  Resp: 20   Filed Vitals:   04/05/12 0532 04/05/12 0822 04/05/12 1025 04/05/12 1026  BP: 148/67     Pulse: 88     Temp: 97.9 F (36.6 C)     TempSrc: Oral     Resp: 20  Height:      Weight: 123.333 kg (271 lb 14.4 oz)     SpO2: 93% 95% 85% 92%    General: Pt is alert, follows commands appropriately, not in acute distress Cardiovascular: Regular rate and rhythm, S1/S2 +, no murmurs, no rubs, no gallops Respiratory: Clear to auscultation bilaterally, no wheezing, no crackles, no rhonchi Abdominal: Soft, non tender, non distended, bowel sounds +, no guarding Extremities: no edema, no cyanosis, pulses palpable bilaterally DP and PT Neuro:  Grossly nonfocal  Discharge Instructions  Discharge Orders    Future Appointments: Provider: Department: Dept Phone: Center:   04/07/2012 10:45 AM Krista Blue [facility] MEDICAL ONCOLOGY [phone] None   04/07/2012 11:15 AM [facility] [place] [facility] [phone] None   04/21/2012 11:15 AM Windell Hum[facility] MED[facility] ONCOLOGY [phone] None   04/21/2012 11:45 AM [facility] [place] [facility] MED[facility] ONCOL[facility]Y [phone] None   05/05/2012 10:45 AM Krista Blue [facility] MEDICAL ONCOLOGY [phone] None   05/05/2012 11:15 AM [facility] [place] [facility] MED[facility] ONCOLOGY [phone] None   05/11/2012 10:30 AM [facility] SITE 3 ECHO LAB [phone] None   05/16/2012 10:45 AM Waymon Budge, MD [facility] [phone] None   05/19/2012 10:30 AM Dava Najjar Idelle Jo [facility] MEDICAL ONCOLOGY [phone] None   05/19/2012 11:00 AM [name], MD [facility] MEDICAL ONCOLOGY [phone] None   05/19/2012 12:00 PM [facility] [place] [facility] MEDICAL ONCOLOGY [phone] None   06/02/2012 11:00 AM Krista Blue [facility] MEDICAL ONCOLOGY [phone] None   06/02/2012 11:45 AM [facility] [place] [facility] MED[facility] ONCOLOGY [phone] None   06/28/2012 9:15 AM Radene Gunning [place] [facility] MEDICAL ONCOLOGY [phone] None   06/28/2012 9:45 AM Amy Allegra Grana, PA [place] [facility] MEDICAL ONCOLOGY [phone] None     Future Orders Please Complete By Expires   Diet - low sodium heart healthy      Increase activity slowly          Medication List     As of 04/05/2012 11:45 AM    TAKE these medications         albuterol 108 (90 BASE) MCG/ACT inhaler   Commonly known as: PROVENTIL HFA;VENTOLIN HFA   Inhale 2 puffs into the lungs 4 (four)  times daily as needed for wheezing or shortness of breath.      albuterol (2.5 MG/3ML) 0.083% nebulizer solution   Commonly known as: PROVENTIL   Take 3 mLs (2.5 mg total) by nebulization every 6 (six) hours as needed for wheezing or shortness of breath.      amLODipine 10 MG tablet   Commonly known as: NORVASC   Take 10 mg by mouth daily before breakfast.      BEPREVE 1.5 % Soln   Generic drug: Bepotastine Besilate   Place 1 drop into both eyes 2 (two) times daily.      budesonide 0.5 MG/2ML nebulizer solution   Commonly known as: PULMICORT   Take 2 mLs (0.5 mg total) by nebulization 2 (two) times daily.      calcium-vitamin D 250-125 MG-UNIT per tablet   Commonly known as: OSCAL WITH D   Take 1 tablet by mouth daily.      carvedilol 12.5 MG tablet   Commonly known as: COREG   Take  2 tablets (25 mg total) by mouth 2 (two) times daily.      cetirizine 10 MG tablet   Commonly known as: ZYRTEC   TAKE ONE (1) TABLET EACH DAY FOR        SEASONAL ALLERGIES      clindamycin 300 MG capsule   Commonly known as: CLEOCIN   Take 1 capsule (300 mg total) by mouth every 6 (six) hours.      DEXILANT 60 MG capsule   Generic drug: dexlansoprazole   Take 60 mg by mouth every morning. Take 15 minutes before breakfast.      dipyridamole-aspirin 200-25 MG per 12 hr capsule   Commonly known as: AGGRENOX   Take 1 capsule by mouth 2 (two) times daily.      Fluticasone-Salmeterol 250-50 MCG/DOSE Aepb   Commonly known as: ADVAIR   Inhale 1 puff into the lungs every 12 (twelve) hours. scheduled      furosemide 20 MG tablet   Commonly known as: LASIX   Take 1 tablet (20 mg total) by mouth daily.      insulin glargine 100 UNIT/ML injection   Commonly known as: LANTUS   Inject 42 Units into the skin daily.      irbesartan 300 MG tablet   Commonly known as: AVAPRO   Take 300 mg by mouth daily before breakfast.      lidocaine-prilocaine cream   Commonly known as: EMLA   Apply a thumbnail  size to port site at least one prior to procedures. Cover with plastic wrap.      metFORMIN 850 MG tablet   Commonly known as: GLUCOPHAGE   Take 850 mg by mouth 3 (three) times daily.      mometasone 50 MCG/ACT nasal spray   Commonly known as: NASONEX   Place 2 sprays into the nose daily. scheduled      multivitamin with minerals Tabs   Take 1 tablet by mouth daily.      OVER THE COUNTER MEDICATION   Take 1 tablet by mouth daily. Otc.Ferrous 45mg       oxyCODONE 5 MG immediate release tablet   Commonly known as: Oxy IR/ROXICODONE   Take 1-2 tablets (5-10 mg total) by mouth every 6 (six) hours as needed for pain.      predniSONE 20 MG tablet   Commonly known as: DELTASONE   Take 40 mg tablet once daily for 2 days, continue tapering down to 30 mg tablet once daily for 2 days, 20 mg tablet once daily for 2 days, 10 mg tablet for 2 days, and then stop. Call [phone] with questions.      pregabalin 200 MG capsule   Commonly known as: LYRICA   Take 200 mg by mouth 2 (two) times daily. scheduled      rosuvastatin 5 MG tablet   Commonly known as: CRESTOR   Take 10 mg by mouth daily.      sitaGLIPtin 100 MG tablet   Commonly known as: JANUVIA   Take 100 mg by mouth daily.      tiotropium 18 MCG inhalation capsule   Commonly known as: SPIRIVA   Place 1 capsule (18 mcg total) into inhaler and inhale daily.      traMADol 50 MG tablet   Commonly known as: ULTRAM   Take 1 tablet (50 mg total) by mouth every 6 (six) hours as needed for pain (cough).      valsartan 80 MG tablet   Commonly known as:  DIOVAN   Take 160 mg by mouth 2 (two) times daily.           Follow-up Information    Follow up with [name], MD. In 2 weeks.   Contact information:   [address] [place] [address] [phone]           The results of significant diagnostics from this hospitalization (including imaging, microbiology, ancillary and laboratory) are listed below for  reference.     Microbiology: Recent Results (from the past 240 hour(s))  CULTURE, BLOOD (ROUTINE X 2)     Status: Normal   Collection Time   03/28/12  8:20 PM      Component Value Range Status Comment   Specimen Description BLOOD LEFT THUMB   Final    Special Requests BOTTLES DRAWN AEROBIC ONLY   Final    Culture  Setup Time 03/29/2012 01:47   Final    Culture NO GROWTH 5 DAYS   Final    Report Status 04/04/2012 FINAL   Final   CULTURE, BLOOD (ROUTINE X 2)     Status: Normal   Collection Time   03/28/12  8:30 PM      Component Value Range Status Comment   Specimen Description BLOOD PORTA CATH   Final    Special Requests BOTTLES DRAWN AEROBIC AND ANAEROBIC 5CC EACH   Final    Culture  Setup Time 03/29/2012 01:48   Final    Culture NO GROWTH 5 DAYS   Final    Report Status 04/04/2012 FINAL   Final   URINE CULTURE     Status: Normal   Collection Time   03/28/12 10:51 PM      Component Value Range Status Comment   Specimen Description URINE, CATHETERIZED   Final    Special Requests NONE   Final    Culture  Setup Time 03/29/2012 08:33   Final    Colony Count NO GROWTH   Final    Culture NO GROWTH   Final    Report Status 03/30/2012 FINAL   Final   MRSA PCR SCREENING     Status: Normal   Collection Time   03/28/12 11:44 PM      Component Value Range Status Comment   MRSA by PCR NEGATIVE  NEGATIVE Final   CULTURE, RESPIRATORY     Status: Normal   Collection Time   03/29/12  1:42 AM      Component Value Range Status Comment   Specimen Description TRACHEAL ASPIRATE   Final    Special Requests NONE   Final    Gram Stain     Final    Value: FEW WBC PRESENT,BOTH PMN AND MONONUCLEAR     RARE SQUAMOUS EPITHELIAL CELLS PRESENT     RARE GRAM POSITIVE COCCI IN CLUSTERS     IN PAIRS   Culture     Final    Value: MODERATE STREPTOCOCCUS,BETA HEMOLYIC NOT GROUP A     MODERATE METHICILLIN RESISTANT STAPHYLOCOCCUS AUREUS     Note: RIFAMPIN AND GENTAMICIN SHOULD NOT BE USED AS SINGLE  DRUGS FOR TREATMENT OF STAPH INFECTIONS. CRITICAL RESULT CALLED TO, READ BACK BY AND VERIFIED WITH: GARLAND RN 845PM 04/01/12 GUSTK   Report Status 04/01/2012 FINAL   Final    Organism ID, Bacteria METHICILLIN RESISTANT STAPHYLOCOCCUS AUREUS   Final   CLOSTRIDIUM DIFFICILE BY PCR     Status: Normal   Collection Time   03/29/12  7:31 AM  Component Value Range Status Comment   C difficile by pcr NEGATIVE  NEGATIVE Final      Labs: Basic Metabolic Panel:  Lab 04/05/12 3664 04/04/12 0600 04/03/12 0540 04/02/12 0729 04/01/12 0331 03/31/12 0452 03/30/12 0518  NA 135 138 137 145 143 -- --  K 3.6 3.1* 3.3* 2.9* 3.5 -- --  CL 98 95* 91* 95* 101 -- --  CO2 32 34* 35* 38* 34* -- --  GLUCOSE 208* 139* 327* 138* 151* -- --  BUN 41* 47* 47* 36* 24* -- --  CREATININE 1.21* 1.35* 1.31* 1.31* 1.13* -- --  CALCIUM 9.1 9.3 9.3 10.1 9.8 -- --  MG -- -- -- 2.0 2.0 2.1 2.3  PHOS -- -- 4.8* 5.5* 2.9 2.8 3.4   CBC:  Lab 04/05/12 0615 04/04/12 0600 04/03/12 0540 04/02/12 0729 04/01/12 0331  WBC 10.4 8.6 9.4 10.0 11.2*  NEUTROABS -- -- -- -- --  HGB 8.4* 8.7* 8.9* 9.4* 8.2*  HCT 29.6* 31.7* 32.0* 33.4* 29.0*  MCV 72.4* 73.7* 73.1* 72.5* 73.2*  PLT 259 326 338 331 329   BNP: BNP (last 3 results)  Basename 04/02/12 0729 03/22/12 1215 02/19/12 0423  PROBNP 441.0* 193.6* 511.4*   CBG:  Lab 04/05/12 0757 04/05/12 0014 04/04/12 2202 04/04/12 1726 04/04/12 1150  GLUCAP 200* 392* 407* 400* 332*     SIGNED: Time coordinating discharge: Over 30 minutes  Debbora Presto, MD  [facility] 04/05/2012, 11:45 AM Pager [phone]  If 7PM-7AM, please contact night-coverage [email or web address] Password [email or web address]

## 2012-04-05 NOTE — Progress Notes (Signed)
Inpatient Diabetes Program Recommendations  AACE/ADA: New Consensus Statement on Inpatient Glycemic Control (2013)  Target Ranges:  Prepandial:   less than 140 mg/dL      Peak postprandial:   less than 180 mg/dL (1-2 hours)      Critically ill patients:  140 - 180 mg/dL   Reason for Visit: Hyperglycemia  Results for Melissa Villa, Melissa Villa (MRN [id number]) as of 04/05/2012 10:12  Ref. Range 04/04/2012 11:50 04/04/2012 17:26 04/04/2012 22:02 04/05/2012 00:14 04/05/2012 07:57  Glucose-Capillary Latest Range: 70-99 mg/dL 782 (H) 956 (H) 213 (H) 392 (H) 200 (H)   Recommendations:  Needs meal coverage insulin while on steroids - Please add Novolog 6 units tidwc. Titrate up Lantus until FBS > 180 mg/dL.  Thank you.  Ailene Ards, RD, LDN, CDE Inpatient Diabetes Coordinator [phone]

## 2012-04-06 ENCOUNTER — Encounter (HOSPITAL_COMMUNITY): Payer: Self-pay | Admitting: Emergency Medicine

## 2012-04-06 ENCOUNTER — Encounter: Payer: Self-pay | Admitting: Dietician

## 2012-04-06 ENCOUNTER — Encounter: Payer: Self-pay | Admitting: Internal Medicine

## 2012-04-06 ENCOUNTER — Emergency Department (HOSPITAL_COMMUNITY)
Admission: EM | Admit: 2012-04-06 | Discharge: 2012-04-06 | Disposition: A | Discharge status: Home / Self Care | Payer: Medicare Other | Attending: Emergency Medicine | Admitting: Emergency Medicine

## 2012-04-06 ENCOUNTER — Telehealth: Payer: Self-pay | Admitting: *Deleted

## 2012-04-06 ENCOUNTER — Emergency Department (HOSPITAL_COMMUNITY): Payer: Medicare Other

## 2012-04-06 ENCOUNTER — Encounter: Payer: Self-pay | Admitting: Family Medicine

## 2012-04-06 DIAGNOSIS — J4489 Other specified chronic obstructive pulmonary disease: Secondary | ICD-10-CM | POA: Insufficient documentation

## 2012-04-06 DIAGNOSIS — Z8679 Personal history of other diseases of the circulatory system: Secondary | ICD-10-CM | POA: Insufficient documentation

## 2012-04-06 DIAGNOSIS — Z8673 Personal history of transient ischemic attack (TIA), and cerebral infarction without residual deficits: Secondary | ICD-10-CM | POA: Insufficient documentation

## 2012-04-06 DIAGNOSIS — Z860100 Personal history of colon polyps, unspecified: Secondary | ICD-10-CM | POA: Insufficient documentation

## 2012-04-06 DIAGNOSIS — E7800 Pure hypercholesterolemia, unspecified: Secondary | ICD-10-CM | POA: Insufficient documentation

## 2012-04-06 DIAGNOSIS — Z87891 Personal history of nicotine dependence: Secondary | ICD-10-CM | POA: Insufficient documentation

## 2012-04-06 DIAGNOSIS — J45909 Unspecified asthma, uncomplicated: Secondary | ICD-10-CM | POA: Insufficient documentation

## 2012-04-06 DIAGNOSIS — IMO0002 Reserved for concepts with insufficient information to code with codable children: Secondary | ICD-10-CM | POA: Insufficient documentation

## 2012-04-06 DIAGNOSIS — G909 Disorder of the autonomic nervous system, unspecified: Secondary | ICD-10-CM | POA: Insufficient documentation

## 2012-04-06 DIAGNOSIS — Z853 Personal history of malignant neoplasm of breast: Secondary | ICD-10-CM | POA: Insufficient documentation

## 2012-04-06 DIAGNOSIS — E11359 Type 2 diabetes mellitus with proliferative diabetic retinopathy without macular edema: Secondary | ICD-10-CM | POA: Insufficient documentation

## 2012-04-06 DIAGNOSIS — E1101 Type 2 diabetes mellitus with hyperosmolarity with coma: Secondary | ICD-10-CM | POA: Insufficient documentation

## 2012-04-06 DIAGNOSIS — I5030 Unspecified diastolic (congestive) heart failure: Secondary | ICD-10-CM | POA: Insufficient documentation

## 2012-04-06 DIAGNOSIS — Z79899 Other long term (current) drug therapy: Secondary | ICD-10-CM | POA: Insufficient documentation

## 2012-04-06 DIAGNOSIS — Z7982 Long term (current) use of aspirin: Secondary | ICD-10-CM | POA: Insufficient documentation

## 2012-04-06 DIAGNOSIS — M79609 Pain in unspecified limb: Secondary | ICD-10-CM | POA: Insufficient documentation

## 2012-04-06 DIAGNOSIS — E1149 Type 2 diabetes mellitus with other diabetic neurological complication: Secondary | ICD-10-CM | POA: Insufficient documentation

## 2012-04-06 DIAGNOSIS — I10 Essential (primary) hypertension: Secondary | ICD-10-CM | POA: Insufficient documentation

## 2012-04-06 DIAGNOSIS — Z9010 Acquired absence of unspecified breast and nipple: Secondary | ICD-10-CM | POA: Insufficient documentation

## 2012-04-06 DIAGNOSIS — Z862 Personal history of diseases of the blood and blood-forming organs and certain disorders involving the immune mechanism: Secondary | ICD-10-CM | POA: Insufficient documentation

## 2012-04-06 DIAGNOSIS — Z8709 Personal history of other diseases of the respiratory system: Secondary | ICD-10-CM | POA: Insufficient documentation

## 2012-04-06 DIAGNOSIS — R531 Weakness: Secondary | ICD-10-CM

## 2012-04-06 DIAGNOSIS — R5381 Other malaise: Secondary | ICD-10-CM | POA: Insufficient documentation

## 2012-04-06 DIAGNOSIS — E1139 Type 2 diabetes mellitus with other diabetic ophthalmic complication: Secondary | ICD-10-CM | POA: Insufficient documentation

## 2012-04-06 DIAGNOSIS — J449 Chronic obstructive pulmonary disease, unspecified: Secondary | ICD-10-CM | POA: Insufficient documentation

## 2012-04-06 DIAGNOSIS — Z794 Long term (current) use of insulin: Secondary | ICD-10-CM | POA: Insufficient documentation

## 2012-04-06 DIAGNOSIS — Z9089 Acquired absence of other organs: Secondary | ICD-10-CM | POA: Insufficient documentation

## 2012-04-06 DIAGNOSIS — Z85118 Personal history of other malignant neoplasm of bronchus and lung: Secondary | ICD-10-CM | POA: Insufficient documentation

## 2012-04-06 DIAGNOSIS — Z8669 Personal history of other diseases of the nervous system and sense organs: Secondary | ICD-10-CM | POA: Insufficient documentation

## 2012-04-06 LAB — BASIC METABOLIC PANEL
BUN: 33 mg/dL — ABNORMAL HIGH (ref 6–23)
CO2: 30 mEq/L (ref 19–32)
Chloride: 103 mEq/L (ref 96–112)
Creatinine, Ser: 1.15 mg/dL — ABNORMAL HIGH (ref 0.50–1.10)
Potassium: 3.5 mEq/L (ref 3.5–5.1)

## 2012-04-06 LAB — URINALYSIS, ROUTINE W REFLEX MICROSCOPIC
Bilirubin Urine: NEGATIVE
Glucose, UA: NEGATIVE mg/dL
Ketones, ur: NEGATIVE mg/dL
Nitrite: NEGATIVE
Specific Gravity, Urine: 1.023 (ref 1.005–1.030)
pH: 5.5 (ref 5.0–8.0)

## 2012-04-06 LAB — CBC
HCT: 35.3 % — ABNORMAL LOW (ref 36.0–46.0)
MCV: 89.6 fL (ref 78.0–100.0)
RBC: 3.94 MIL/uL (ref 3.87–5.11)
WBC: 12.7 10*3/uL — ABNORMAL HIGH (ref 4.0–10.5)

## 2012-04-06 LAB — CK TOTAL AND CKMB (NOT AT ARMC)
CK, MB: 2 ng/mL (ref 0.3–4.0)
Total CK: 46 U/L (ref 7–177)

## 2012-04-06 LAB — GLUCOSE, CAPILLARY

## 2012-04-06 LAB — URINE MICROSCOPIC-ADD ON

## 2012-04-06 MED ORDER — SODIUM CHLORIDE 0.9 % IJ SOLN
10.0000 mL | INTRAMUSCULAR | Status: DC | PRN
  Administered 2012-04-06: 10 mL

## 2012-04-06 MED ORDER — HEPARIN SOD (PORK) LOCK FLUSH 100 UNIT/ML IV SOLN
500.0000 [IU] | INTRAVENOUS | Status: AC | PRN
  Administered 2012-04-06: 500 [IU]

## 2012-04-06 MED ORDER — SODIUM CHLORIDE 0.9 % IJ SOLN: 10.0000 mL | Freq: Two times a day (BID) | INTRAMUSCULAR | Status: DC

## 2012-04-06 MED ORDER — OXYCODONE-ACETAMINOPHEN 5-325 MG PO TABS
1.0000 | ORAL_TABLET | Freq: Once | ORAL | Status: AC
  Administered 2012-04-06: 1 via ORAL
  Filled 2012-04-06: qty 1

## 2012-04-06 NOTE — ED Notes (Signed)
Social worker in to see pt before discharge to be sure family has all they need to care for her

## 2012-04-06 NOTE — ED Notes (Signed)
Pt has returned from xray. Pa in to discuss options for her possible care for discharge.

## 2012-04-06 NOTE — ED Notes (Signed)
Patient claims legs and lower back pain.  Patient claims when she tried to ambulate her legs "gave way, they felt heavy".

## 2012-04-06 NOTE — ED Notes (Signed)
Patient transported to X-ray 

## 2012-04-06 NOTE — ED Notes (Signed)
Pt and her family have discussed discharge options. The pt wants to go back to her home. Her son at bedside is agreeable to this

## 2012-04-06 NOTE — ED Notes (Signed)
Dr Bebe Shaggy in to speak with pt and family about her disposition.

## 2012-04-06 NOTE — ED Provider Notes (Signed)
Pt to come to the CDU to await case management consult and perhaps placement into a facility. She complaints of weakness and being unable to do her ADLs because of this. Medically she is stable at this time.   Pt seen originally by Jaci Carrel, PA-C who has consulted case management.    1:29pm- After face to face discussion with the patient she is expressing the desire to go home and that she does not want to be placed in a facility. She informs me that the reason she came is because her doctor told her to when she let him know that her legs feel heavy. She has [facility] set up for her to have assistance at home. Her son is here who is also adamate about her not being sent to a facility. She says that she has plenty of help and will do exercises in bed.  I have advised Jaci Carrel, PA-C in the change of plans I have spoken with Dr. Bebe Shaggy. Pt is  Not to be discharged before he see's her. He will give direction on pt plan.  2:08pm- Dr. Bebe Shaggy saw pt. Pt given percocet in ED. Family and pt want her to go home. Pt has Leukocytes in Urine, already on Clinda, Culture sent out.  Pt has been advised of the symptoms that warrant their return to the ED. Patient has voiced understanding and has agreed to follow-up with the PCP or specialist.   Dorthula Matas, PA 04/06/12 1409

## 2012-04-06 NOTE — ED Notes (Signed)
Spoke with Melissa Villa Child psychotherapist. She is working with Melissa Villa to get pt wheel chair ordered and delivered to her home

## 2012-04-06 NOTE — ED Notes (Signed)
Care transferred and report given to Annette, RN. 

## 2012-04-06 NOTE — Telephone Encounter (Signed)
Agree, thanks

## 2012-04-06 NOTE — ED Notes (Signed)
Iv team called to deaccess pt port

## 2012-04-06 NOTE — ED Notes (Signed)
Meal ordered for pt 

## 2012-04-06 NOTE — ED Notes (Signed)
Spoke with pt who adamantly refuses NHP.  Pt wants to know what to do to help her legs get stronger.  [facility] was arranged prior to pt's d/c from [facility], but they have not started to work with pt yet.  Pt encouraged to work with PT on strengthening exercises for her legs that she can do at home.  Pt requested wheelchair for home use and CSW called RNCM to arrange this.

## 2012-04-06 NOTE — ED Provider Notes (Signed)
History     CSN: [id number]  Arrival date & time 04/06/12  4098   First MD Initiated Contact with Patient 04/06/12 1001      Chief Complaint  Patient presents with  . Extremity Weakness  . Leg Pain    (Consider location/radiation/quality/duration/timing/severity/associated sxs/prior treatment) HPI Comments: Melissa Villa is a 66 y.o. Female w PMHx including type 2 diabetes, iron deficient anemia, obstructive sleep apnea, congestive heart failure, COPD, and stage III breast cancer, RLL lobectomy for carcinoid tumor presents emergency department for generalized weakness.  Patient was recently discharged from [facility] after being admitted from 03/29/19/2013 through 12/18 2013.  At that time patient was admitted and worked up after being brought to the emergency department unresponsive requiring intubation & VDRF d/t strep and MRSA PNA and discharged with 5 days of clindamycin.  Hospitalists were unable to find patient placement at which point she became agreeable to have [facility] with assistance by agents daughter.  However, yesterday patient had an episode of generalized weakness that she stated lying on the floor for an hour with inability to stand due to to her generalized weakness.  She's been advised to return to the emergency department for placement.  The history is provided by the patient and medical records.    Past Medical History  Diagnosis Date  . CHF (congestive heart failure)      2-D echo 02/19/2009 showed left ventricle cavity size normal; systolic function normal, estimated ejection fraction 55%; wall motion normal; no regional wall motion abnormalities; PA peak pressure 47mm Hg.  [name] COPD (chronic obstructive pulmonary disease)     Pulmonary function tests on 08/05/2010 showed mixed obstructive and restrictive lung disease with no significant response to bronchodilator, and decreased diffusion capacity that corrects to normal range when adjusted  for the inhaled alveolar volume.  . Degenerative joint disease   . AV block, complete     S/P dual-chamber for symptomatic episodes of bradycardia and complete heart block.  . Pacemaker     S/P dual-chamber for symptomatic episodes of bradycardia and complete heart block.  . Adenomatous polyp of colon     Tubular adenomas X 2 found 06/1999; negative screening colonoscopy 02/13/2004 by Dr. [name].  . Hypercholesteremia   . Obstructive sleep apnea     Baseline diagnostic nocturnal polysomnogram on July 27, 2005 showed severe obstructive sleep apnea/hypopnea syndrome, with AHI 153.2 per hour.  Last nocturnal polysomnogram done for CPAP titration was 05/01/2009; CPAP was titrated to 13 CWP, AHI 1.8 per hour using a large Respironics FitLife Full Face Mask with heated humidifier.  . Peripheral autonomic neuropathy due to diabetes mellitus   . 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 [name]  . Constipation   . Hand pain   . Palpitation   . Rib pain     right sided   . DM type 2 (diabetes mellitus, type 2)   . Skin rash   . Urinary incontinence   . Proliferative diabetic retinopathy(362.02)     S/P panretinal photocoagulation by Dr. [name]  . Vitreous hemorrhage     With tractional detachment, left eye; S/P posterior vitrectomy with membrane peel by Dr. [name] 04/06/2005  . Dyspnea on exertion     Cardiac cath 06/17/2005 by Dr. [name] showed LVEF 50-55%, 30% proximal LAD stenosis, small diagonals.   . Shingles rash 03/25/11  . Breast cancer  S/P left lumpectomy and axillary node dissection 03/1991 for a T1 N0 medullary breast cancer, no lymph node involvement, treated with tamoxifen for 2 years; S/P right lumpectomy and axillary lymph node dissection August 1995 for a T1 N0 microinvasive breast cancer, treated with Arimidex for 5 years.  Found to have invasive ductal carcinoma of the right breast in 10/ 09/20/2011. Followed by Dr. [name].   . Breast cancer, stage 3 102/13    right hx lumpectomy 1994, bx today=invasive ductal ca  . Allergy     thinitis  . Asthma   . Bronchitis     hx  . Cataract     b/l surgery  . DM hyperosmolarity type II   . Hypertension   . COPD (chronic obstructive pulmonary disease)   . Diastolic CHF   . TIA (transient ischemic attack)   . OSA (obstructive sleep apnea)   . Chronic respiratory failure with hypoxia   . Anemia   . DJD (degenerative joint disease)   . AV block   . DM retinopathy   . DM neuropathies   . Carcinoid tumor of lung     Past Surgical History  Procedure Date  . Mastectomy partial / lumpectomy w/ axillary lymphadenectomy Sep 20, 1990, 1993-09-19    S/P left lumpectomy and axillary lymph node dissection December 1992 for a T1 N0 medullary breast cancer, no lymph node involvement, treated with tamoxifen for 2 years; S/P right lumpectomy and axillary lymph node dissection August 1995 for a T1 N0 microinvasive breast cancer, treated with Arimidex for 5 years.  Patient is followed by Dr. [name] at the cancer center.  . Lung lobectomy 06/13/2001    S/P right lower lobe lobectomy 06/13/2001 by Dr. [name]  . Pacemaker insertion 04/20/2001  . Mastectomy partial / lumpectomy   . Lobectomy   . Pacemaker insertion   . Cholecystectomy   . Tonsillectomy     Family History  Problem Relation Age of Onset  . Breast cancer Mother 49    Deceased September 20, 2002  . Diabetes type II Mother   . Heart attack Brother   . Cervical cancer Neg Hx   . Colon cancer Neg Hx   . Breast cancer Mother   . Diabetes Mellitus II Mother   . Hypertension Brother   . Coronary artery disease Brother   . Stroke Brother   . Diabetes type II Brother   . Hypertension Sister     History  Substance Use Topics  . Smoking status: Former Smoker -- 1.0 packs/day for 15 years    Types: Cigarettes    Quit date: 04/19/2001  . Smokeless tobacco: Never Used  . Alcohol Use: No    OB History    Grav Para Term Preterm  Abortions TAB SAB Ect Mult Living                  Review of Systems  Neurological: Positive for weakness. Negative for syncope and light-headedness.  All other systems reviewed and are negative.    Allergies  Review of patient's allergies indicates no known allergies.  Home Medications   Current Outpatient Rx  Name  Route  Sig  Dispense  Refill  . ALBUTEROL SULFATE HFA 108 (90 BASE) MCG/ACT IN AERS   Inhalation   Inhale 2 puffs into the lungs 4 (four) times daily as needed for wheezing or shortness of breath.   1 Inhaler   11   . ALBUTEROL SULFATE (2.5 MG/3ML) 0.083% IN NEBU   Nebulization  Take 3 mLs (2.5 mg total) by nebulization every 6 (six) hours as needed for wheezing or shortness of breath.   75 mL   12   . AMLODIPINE BESYLATE 10 MG PO TABS   Oral   Take 10 mg by mouth daily before breakfast.         . BEPOTASTINE BESILATE 1.5 % OP SOLN   Both Eyes   Place 1 drop into both eyes 2 (two) times daily.         . BUDESONIDE 0.5 MG/2ML IN SUSP   Nebulization   Take 2 mLs (0.5 mg total) by nebulization 2 (two) times daily.   2 mL   3   . CALCIUM CARBONATE-VITAMIN D 250-125 MG-UNIT PO TABS   Oral   Take 1 tablet by mouth daily.         [name] CARVEDILOL 12.5 MG PO TABS   Oral   Take 2 tablets (25 mg total) by mouth 2 (two) times daily.   120 tablet   6   . CETIRIZINE HCL 10 MG PO TABS   Oral   Take 10 mg by mouth daily as needed. For seasonal allergies         . CLINDAMYCIN HCL 300 MG PO CAPS   Oral   Take 1 capsule (300 mg total) by mouth every 6 (six) hours.   20 capsule   0   . DEXLANSOPRAZOLE 60 MG PO CPDR   Oral   Take 60 mg by mouth every morning. Take 15 minutes before breakfast.         . ASPIRIN-DIPYRIDAMOLE ER 25-200 MG PO CP12   Oral   Take 1 capsule by mouth 2 (two) times daily.          [name] FLUTICASONE-SALMETEROL 250-50 MCG/DOSE IN AEPB   Inhalation   Inhale 1 puff into the lungs every 12 (twelve) hours. scheduled          . FUROSEMIDE 20 MG PO TABS   Oral   Take 1 tablet (20 mg total) by mouth daily.   30 tablet   1   . INSULIN GLARGINE 100 UNIT/ML Carmine SOLN   Subcutaneous   Inject 42 Units into the skin daily.   15 mL   12   . IRBESARTAN 300 MG PO TABS   Oral   Take 300 mg by mouth daily before breakfast.         . LIDOCAINE-PRILOCAINE 2.5-2.5 % EX CREA      Apply a thumbnail size to port site at least one prior to procedures. Cover with plastic wrap.   30 g   1   . METFORMIN HCL 850 MG PO TABS   Oral   Take 850 mg by mouth 3 (three) times daily.         . MOMETASONE FUROATE 50 MCG/ACT NA SUSP   Nasal   Place 2 sprays into the nose daily. scheduled         . ADULT MULTIVITAMIN W/MINERALS CH   Oral   Take 1 tablet by mouth daily.         [name] OVER THE COUNTER MEDICATION   Oral   Take 1 tablet by mouth daily. Otc.Ferrous 45mg          . PREDNISONE 20 MG PO TABS      Take 40 mg tablet once daily for 2 days, continue tapering down to 30 mg tablet once daily for 2 days, 20 mg tablet once daily  for 2 days, 10 mg tablet for 2 days, and then stop. Call [phone] with questions.   15 tablet   0   . PREGABALIN 200 MG PO CAPS   Oral   Take 200 mg by mouth 2 (two) times daily. scheduled         . ROSUVASTATIN CALCIUM 5 MG PO TABS   Oral   Take 10 mg by mouth daily.         [name] SITAGLIPTIN PHOSPHATE 100 MG PO TABS   Oral   Take 100 mg by mouth daily.         [name] TIOTROPIUM BROMIDE MONOHYDRATE 18 MCG IN CAPS   Inhalation   Place 1 capsule (18 mcg total) into inhaler and inhale daily.   30 capsule   11   . TRAMADOL HCL 50 MG PO TABS   Oral   Take 1 tablet (50 mg total) by mouth every 6 (six) hours as needed for pain (cough).   60 tablet   2   . VALSARTAN 160 MG PO TABS   Oral   Take 160 mg by mouth 2 (two) times daily.         . OXYCODONE HCL 5 MG PO TABS   Oral   Take 1-2 tablets (5-10 mg total) by mouth every 6 (six) hours as needed for pain.   60 tablet    0     BP 98/61  Pulse 80  Temp 99.4 F (37.4 C) (Oral)  Resp 20  Ht 5\' 4"  (1.626 m)  Wt 277 lb (125.646 kg)  BMI 47.55 kg/m2  SpO2 98%  Physical Exam  Nursing note and vitals reviewed. Constitutional: She is oriented to person, place, and time. She appears well-developed and well-nourished. No distress.  HENT:  Head: Normocephalic and atraumatic.  Eyes: Conjunctivae normal and EOM are normal.  Neck: Normal range of motion.  Cardiovascular: Normal rate.        No peripheral edema, intact distal pulses  Pulmonary/Chest: Effort normal.       2L [place] at baseline. Expiratory wheezing and bilateral lower lube rhonchi. Effort normal  Abdominal: Soft.       obese soft, non tender  Musculoskeletal: Normal range of motion.       Lower extremities with muscular ttp. Normal ROM.   Neurological: She is alert and oriented to person, place, and time.  Skin: Skin is warm and dry. No rash noted. She is not diaphoretic.  Psychiatric: She has a normal mood and affect. Her behavior is normal.    ED Course  Procedures (including critical care time)  Labs Reviewed  GLUCOSE, CAPILLARY - Abnormal; Notable for the following:    Glucose-Capillary 133 (*)     All other components within normal limits  CBC  BASIC METABOLIC PANEL  URINALYSIS, ROUTINE W REFLEX MICROSCOPIC   No results found.  Date: 04/06/2012  Rate: 81  Rhythm: normal sinus rhythm  QRS Axis: normal  Intervals: normal  ST/T Wave abnormalities: normal  Conduction Disutrbances: none  Narrative Interpretation:   Old EKG Reviewed: No significant changes noted     No diagnosis found.  Medical chart reviewed: Pt given 1 dose of zosyn at admission then dc w clindamycin based on sputum culture results.  MDM  66 year old female with history of cancer, heart disease, and morbid obesity presented to the emergency department complaining of generalized weakness after being discharged from [facility] with home health care  assistance after patient was in  able to be placed in a nursing facility and.  Patient was diagnosed with acute respiratory failure, ventilator dependent do to strep and MRSA pneumonia and was discharged on clindamycin. Pt is seen and examined; Initial history and physical IV fluids, pain meds & antiemetics given. Labs and urine tests have been ordered and pending. Disposition will be pending lab studies and reassessment.  Case manager has been consulted to help find placement and his labs returned within normal limits patient will be discharged to a care facility.  Patient care discussed with CDU provider who is agreed to resume care of this patient.       Jaci Carrel, [place] 04/06/12 1231

## 2012-04-06 NOTE — ED Notes (Signed)
Patient refused peripheral.  Patient request power port be accessed.  IV team called to come an access.

## 2012-04-06 NOTE — Telephone Encounter (Signed)
Pt sent an email stating she is very weak and cannot stand up, states since hosp disch she cannot stand" my legs give way and i fall", she is advised to come to ED now, having someone to drive her, she is agreeable

## 2012-04-07 ENCOUNTER — Other Ambulatory Visit (HOSPITAL_BASED_OUTPATIENT_CLINIC_OR_DEPARTMENT_OTHER): Payer: Medicare Other | Admitting: Lab

## 2012-04-07 ENCOUNTER — Ambulatory Visit (HOSPITAL_BASED_OUTPATIENT_CLINIC_OR_DEPARTMENT_OTHER): Payer: Medicare Other

## 2012-04-07 VITALS — BP 140/76 | HR 87 | Temp 97.5°F | Resp 19

## 2012-04-07 DIAGNOSIS — Z5112 Encounter for antineoplastic immunotherapy: Secondary | ICD-10-CM

## 2012-04-07 DIAGNOSIS — C50919 Malignant neoplasm of unspecified site of unspecified female breast: Secondary | ICD-10-CM

## 2012-04-07 LAB — COMPREHENSIVE METABOLIC PANEL (CC13)
ALT: 26 U/L (ref 0–55)
AST: 15 U/L (ref 5–34)
Albumin: 2.6 g/dL — ABNORMAL LOW (ref 3.5–5.0)
Alkaline Phosphatase: 90 U/L (ref 40–150)
Glucose: 303 mg/dl — ABNORMAL HIGH (ref 70–99)
Potassium: 4.6 mEq/L (ref 3.5–5.1)
Sodium: 140 mEq/L (ref 136–145)
Total Bilirubin: 0.2 mg/dL (ref 0.20–1.20)
Total Protein: 6.6 g/dL (ref 6.4–8.3)

## 2012-04-07 LAB — CBC WITH DIFFERENTIAL/PLATELET
BASO%: 0.9 % (ref 0.0–2.0)
EOS%: 0.1 % (ref 0.0–7.0)
HGB: 8.4 g/dL — ABNORMAL LOW (ref 11.6–15.9)
MCH: 20.8 pg — ABNORMAL LOW (ref 25.1–34.0)
MCHC: 28.7 g/dL — ABNORMAL LOW (ref 31.5–36.0)
MCV: 72.7 fL — ABNORMAL LOW (ref 79.5–101.0)
MONO%: 4 % (ref 0.0–14.0)
RBC: 4.03 10*6/uL (ref 3.70–5.45)
RDW: 23.4 % — ABNORMAL HIGH (ref 11.2–14.5)
lymph#: 0.8 10*3/uL — ABNORMAL LOW (ref 0.9–3.3)

## 2012-04-07 LAB — URINE CULTURE: Colony Count: 9000

## 2012-04-07 MED ORDER — DIPHENHYDRAMINE HCL 25 MG PO CAPS
50.0000 mg | ORAL_CAPSULE | Freq: Once | ORAL | Status: AC
  Administered 2012-04-07: 50 mg via ORAL

## 2012-04-07 MED ORDER — ACETAMINOPHEN 325 MG PO TABS
650.0000 mg | ORAL_TABLET | Freq: Once | ORAL | Status: AC
  Administered 2012-04-07: 650 mg via ORAL

## 2012-04-07 MED ORDER — HEPARIN SOD (PORK) LOCK FLUSH 100 UNIT/ML IV SOLN
500.0000 [IU] | Freq: Once | INTRAVENOUS | Status: AC | PRN
  Administered 2012-04-07: 500 [IU]
  Filled 2012-04-07: qty 5

## 2012-04-07 MED ORDER — TRASTUZUMAB CHEMO INJECTION 440 MG
4.0000 mg/kg | Freq: Once | INTRAVENOUS | Status: AC
  Administered 2012-04-07: 504 mg via INTRAVENOUS
  Filled 2012-04-07: qty 24

## 2012-04-07 MED ORDER — SODIUM CHLORIDE 0.9 % IV SOLN
Freq: Once | INTRAVENOUS | Status: AC
  Administered 2012-04-07: 12:00:00 via INTRAVENOUS

## 2012-04-07 MED ORDER — SODIUM CHLORIDE 0.9 % IJ SOLN
10.0000 mL | INTRAMUSCULAR | Status: DC | PRN
  Administered 2012-04-07: 10 mL
  Filled 2012-04-07: qty 10

## 2012-04-07 NOTE — ED Provider Notes (Signed)
Medical screening examination/treatment/procedure(s) were conducted as a shared visit with non-physician practitioner(s) and myself.  I personally evaluated the patient during the encounter  Pt awake/alert, no distress.  She insists on going home.  She is eating a meal and in no distress.  She does have home nursing.  Will f/u on urine culture after discharge as already on clinda BP 128/74  Pulse 76  Temp 98.2 F (36.8 C) (Oral)  Resp 24  Ht 5\' 4"  (1.626 m)  Wt 277 lb (125.646 kg)  BMI 47.55 kg/m2  SpO2 99%   Joya Gaskins, MD 04/07/12 [phone]

## 2012-04-07 NOTE — ED Provider Notes (Signed)
Medical screening examination/treatment/procedure(s) were conducted as a shared visit with non-physician practitioner(s) and myself.  I personally evaluated the patient during the encounter   Joya Gaskins, MD 04/07/12 [phone]

## 2012-04-07 NOTE — Patient Instructions (Signed)
[facility] Discharge Instructions for Patients Receiving Chemotherapy  Today you received the following chemotherapy agent Herceptin  To help prevent nausea and vomiting after your treatment, we encourage you to take your nausea medication. Begin taking your nausea medication as often as prescribed for by Dr. Magrinat.    If you develop nausea and vomiting that is not controlled by your nausea medication, call the clinic. If it is after clinic hours your family physician or the after hours number for the clinic or go to the Emergency Department.   BELOW ARE SYMPTOMS THAT SHOULD BE REPORTED IMMEDIATELY:  *FEVER GREATER THAN 100.5 F  *CHILLS WITH OR WITHOUT FEVER  NAUSEA AND VOMITING THAT IS NOT CONTROLLED WITH YOUR NAUSEA MEDICATION  *UNUSUAL SHORTNESS OF BREATH  *UNUSUAL BRUISING OR BLEEDING  TENDERNESS IN MOUTH AND THROAT WITH OR WITHOUT PRESENCE OF ULCERS  *URINARY PROBLEMS  *BOWEL PROBLEMS  UNUSUAL RASH Items with * indicate a potential emergency and should be followed up as soon as possible.  One of the nurses will contact you 24 hours after your treatment. Please let the nurse know about any problems that you may have experienced. Feel free to call the clinic you have any questions or concerns. The clinic phone number is [phone].   I have been informed and understand all the instructions given to me. I know to contact the clinic, my physician, or go to the Emergency Department if any problems should occur. I do not have any questions at this time, but understand that I may call the clinic during office hours   should I have any questions or need assistance in obtaining follow up care.    __________________________________________  _____________  __________ Signature of Patient or Authorized Representative            Date                   Time    __________________________________________ Nurse's Signature    

## 2012-04-08 LAB — CANCER ANTIGEN 27.29: CA 27.29: 62 U/mL — ABNORMAL HIGH (ref 0–39)

## 2012-04-10 ENCOUNTER — Telehealth: Payer: Self-pay | Admitting: Dietician

## 2012-04-10 NOTE — Telephone Encounter (Signed)
Discharge date:04/05/12 Call date: 04/10/12 Hospital follow up appointment date: 04/25/12 at 10:30 AM with  Dr. Clyde Lundborg  Calling to assist with transition of care from hospital to home.  Discharge medications reviewed:no- patient reports she has them all and is taking as prescribed.  Able to fill all prescriptions? Yes per pt  Patient aware of hospital follow up appointments. yes   No problems with transportation? no per pt  Other problems/concerns:blood sugars are staying high- wants to reschedule missed appointment with CDE and also to know if this is expected until she comes off other medicines.

## 2012-04-15 ENCOUNTER — Other Ambulatory Visit: Payer: Self-pay | Admitting: Internal Medicine

## 2012-04-17 ENCOUNTER — Other Ambulatory Visit (HOSPITAL_COMMUNITY): Payer: Self-pay

## 2012-04-17 ENCOUNTER — Telehealth: Payer: Self-pay | Admitting: Nutrition

## 2012-04-17 NOTE — Telephone Encounter (Signed)
Patient called me and left me a message to call her.  Patient is concerned with nutrition intake to manage diabetes and recurrent breast cancer diagnosis.  Patient states she was contacted by Gavin Pound, RD and she is scheduled to meet with her soon.  Patient was encouraged to write down her questions and take them to appointment with RD.

## 2012-04-21 ENCOUNTER — Ambulatory Visit (HOSPITAL_BASED_OUTPATIENT_CLINIC_OR_DEPARTMENT_OTHER): Payer: Medicare Other

## 2012-04-21 ENCOUNTER — Other Ambulatory Visit (HOSPITAL_BASED_OUTPATIENT_CLINIC_OR_DEPARTMENT_OTHER): Payer: Medicare Other | Admitting: Lab

## 2012-04-21 VITALS — BP 144/58 | HR 81 | Temp 98.5°F

## 2012-04-21 DIAGNOSIS — C50919 Malignant neoplasm of unspecified site of unspecified female breast: Secondary | ICD-10-CM

## 2012-04-21 DIAGNOSIS — Z5112 Encounter for antineoplastic immunotherapy: Secondary | ICD-10-CM

## 2012-04-21 LAB — COMPREHENSIVE METABOLIC PANEL (CC13)
ALT: 12 U/L (ref 0–55)
AST: 17 U/L (ref 5–34)
Albumin: 2.3 g/dL — ABNORMAL LOW (ref 3.5–5.0)
Alkaline Phosphatase: 68 U/L (ref 40–150)
BUN: 17 mg/dL (ref 7.0–26.0)
Potassium: 4.4 mEq/L (ref 3.5–5.1)

## 2012-04-21 LAB — CBC WITH DIFFERENTIAL/PLATELET
BASO%: 0.3 % (ref 0.0–2.0)
Basophils Absolute: 0 10*3/uL (ref 0.0–0.1)
EOS%: 1.5 % (ref 0.0–7.0)
MCH: 20.9 pg — ABNORMAL LOW (ref 25.1–34.0)
MCHC: 29.4 g/dL — ABNORMAL LOW (ref 31.5–36.0)
MCV: 71.1 fL — ABNORMAL LOW (ref 79.5–101.0)
MONO%: 8.1 % (ref 0.0–14.0)
RBC: 4.1 10*6/uL (ref 3.70–5.45)
RDW: 23.6 % — ABNORMAL HIGH (ref 11.2–14.5)

## 2012-04-21 MED ORDER — SODIUM CHLORIDE 0.9 % IJ SOLN
10.0000 mL | INTRAMUSCULAR | Status: DC | PRN
  Administered 2012-04-21: 10 mL
  Filled 2012-04-21: qty 10

## 2012-04-21 MED ORDER — ACETAMINOPHEN 325 MG PO TABS
650.0000 mg | ORAL_TABLET | Freq: Once | ORAL | Status: AC
  Administered 2012-04-21: 650 mg via ORAL

## 2012-04-21 MED ORDER — SODIUM CHLORIDE 0.9 % IV SOLN
Freq: Once | INTRAVENOUS | Status: AC
  Administered 2012-04-21: 12:00:00 via INTRAVENOUS

## 2012-04-21 MED ORDER — TRASTUZUMAB CHEMO INJECTION 440 MG
4.0000 mg/kg | Freq: Once | INTRAVENOUS | Status: AC
  Administered 2012-04-21: 504 mg via INTRAVENOUS
  Filled 2012-04-21: qty 24

## 2012-04-21 MED ORDER — DIPHENHYDRAMINE HCL 25 MG PO CAPS
50.0000 mg | ORAL_CAPSULE | Freq: Once | ORAL | Status: AC
  Administered 2012-04-21: 50 mg via ORAL

## 2012-04-21 MED ORDER — HEPARIN SOD (PORK) LOCK FLUSH 100 UNIT/ML IV SOLN
500.0000 [IU] | Freq: Once | INTRAVENOUS | Status: AC | PRN
  Administered 2012-04-21: 500 [IU]
  Filled 2012-04-21: qty 5

## 2012-04-21 NOTE — Patient Instructions (Signed)
[facility] Discharge Instructions for Patients Receiving Chemotherapy  Today you received the following chemotherapy agents Herceptin  To help prevent nausea and vomiting after your treatment, we encourage you to take your nausea medication as prescribed.   If you develop nausea and vomiting that is not controlled by your nausea medication, call the clinic. If it is after clinic hours your family physician or the after hours number for the clinic or go to the Emergency Department.   BELOW ARE SYMPTOMS THAT SHOULD BE REPORTED IMMEDIATELY:  *FEVER GREATER THAN 100.5 F  *CHILLS WITH OR WITHOUT FEVER  NAUSEA AND VOMITING THAT IS NOT CONTROLLED WITH YOUR NAUSEA MEDICATION  *UNUSUAL SHORTNESS OF BREATH  *UNUSUAL BRUISING OR BLEEDING  TENDERNESS IN MOUTH AND THROAT WITH OR WITHOUT PRESENCE OF ULCERS  *URINARY PROBLEMS  *BOWEL PROBLEMS  UNUSUAL RASH Items with * indicate a potential emergency and should be followed up as soon as possible.  Feel free to call the clinic you have any questions or concerns. The clinic phone number is [phone].   I have been informed and understand all the instructions given to me. I know to contact the clinic, my physician, or go to the Emergency Department if any problems should occur. I do not have any questions at this time, but understand that I may call the clinic during office hours   should I have any questions or need assistance in obtaining follow up care.   

## 2012-04-25 ENCOUNTER — Ambulatory Visit (INDEPENDENT_AMBULATORY_CARE_PROVIDER_SITE_OTHER): Payer: Medicare Other | Admitting: Internal Medicine

## 2012-04-25 ENCOUNTER — Ambulatory Visit (INDEPENDENT_AMBULATORY_CARE_PROVIDER_SITE_OTHER): Payer: Medicare Other | Admitting: Dietician

## 2012-04-25 ENCOUNTER — Encounter: Payer: Self-pay | Admitting: Internal Medicine

## 2012-04-25 VITALS — BP 116/60 | HR 89 | Temp 99.4°F | Ht 64.0 in | Wt 289.4 lb

## 2012-04-25 DIAGNOSIS — M549 Dorsalgia, unspecified: Secondary | ICD-10-CM

## 2012-04-25 DIAGNOSIS — I509 Heart failure, unspecified: Secondary | ICD-10-CM

## 2012-04-25 DIAGNOSIS — J449 Chronic obstructive pulmonary disease, unspecified: Secondary | ICD-10-CM

## 2012-04-25 DIAGNOSIS — J4489 Other specified chronic obstructive pulmonary disease: Secondary | ICD-10-CM

## 2012-04-25 DIAGNOSIS — J9600 Acute respiratory failure, unspecified whether with hypoxia or hypercapnia: Secondary | ICD-10-CM

## 2012-04-25 DIAGNOSIS — I10 Essential (primary) hypertension: Secondary | ICD-10-CM

## 2012-04-25 DIAGNOSIS — E119 Type 2 diabetes mellitus without complications: Secondary | ICD-10-CM

## 2012-04-25 LAB — BASIC METABOLIC PANEL WITH GFR
BUN: 18 mg/dL (ref 6–23)
CO2: 26 mEq/L (ref 19–32)
Chloride: 108 mEq/L (ref 96–112)
Creat: 1.29 mg/dL — ABNORMAL HIGH (ref 0.50–1.10)
Glucose, Bld: 92 mg/dL (ref 70–99)

## 2012-04-25 MED ORDER — TRAMADOL HCL 50 MG PO TABS: 50.0000 mg | ORAL_TABLET | Freq: Four times a day (QID) | ORAL | Status: DC | PRN

## 2012-04-25 NOTE — Assessment & Plan Note (Signed)
Lab Results  Component Value Date   HGBA1C 7.0* 01/24/2012   HGBA1C 7.4 11/24/2011   HGBA1C 6.8 08/25/2011     Assessment:  Diabetes control: good control (HgbA1C at goal)  Progress toward A1C goal:  at goal  Comments:   Plan:  Medications:  continue current medications  Home glucose monitoring:   Frequency: once a day   Timing:    Instruction/counseling given: reminded to bring blood glucose meter & log to each visit  Educational resources provided: brochure  Self management tools provided:    Other plans: Diabetes is well controlled. A1c is at goal. We'll continue current regimen(Lantus 42 units daily, metformin 850mg  3 times a day and Januvia 100 mg daily)

## 2012-04-25 NOTE — Assessment & Plan Note (Signed)
Her COPD is stable. There is no signs of full acute exacerbation. Her lung auscultation is clear bilaterally. Her oxygen saturation is at 91% at clinic today. will continue current regimen and followup in one month.

## 2012-04-25 NOTE — Progress Notes (Signed)
Medical Nutrition Therapy:  Appt start time: 1200 end time:  1230. last MNT 05/12/11  Assessment:  Primary concerns today: Weight management and Meal planning. Wants to get weight "off her lung". Note A1c almost at target- meter dowoad shows improving blood sugars. Usual eating pattern includes 2 meals and 0-2acks per day.Usual physical activity includes ADLS. Drinks cranberry juice and crystal lite. Has fruit occasionally, veggies daily. Labs noted- weight fairly stable. 24-hr recall: B (6-10 AM) sometimes drinks milk when she gets up and blood sugar fees or is low  L ( PM)- skips often Snk ( PM)- gets hungry and eats crackers and peanutbutter D (5-7)- baked meat, vegetable and starch Snk - crackers & peanutbutter  Progress Towards Goal(s):  In progress.   Nutritional Diagnosis:  NB-1.6 Limited adherence to nutrition-related recommendations As related to competing priorooties and not being ready for lifestyle changes  is improving .  As evidenced by her interest in meal panning and making healthier choices today as well as following up with cancer center RD.[name]    Intervention:  Nutrition education about timing of meals, nutrient density, spreading carbs out throughout the day, suggest she consider nutritional shake if needed and plate method to make meal planning easier.  Coordinaion of care- routed note to cancer center rd and requested follow up there for patient goals  Monitoring/Evaluation:  Dietary intake, exercise, blood sugars, and body weight prn.

## 2012-04-25 NOTE — Assessment & Plan Note (Addendum)
Patient carries diagnosis of congestive heart failure. Her recent 2-D echo showed EF 65-70% without wall motion weakness or systolic dysfunction. Currently patient does not have worsening shortness of breath, palpitation, chest pain or worsening fluid retention. Her lung auscultation is clear bilaterally. Will continue current regimen and follow up.

## 2012-04-25 NOTE — Assessment & Plan Note (Signed)
After discharged from hospital, patient recovers very well. She still has mild shortness of breath and occasional cough which is nonproductive. She is using 2 L of oxygen at home. Her shortness of breath is at her baseline. Her auscultation is clear bilaterally. Today at clinic her oxygen saturation is 91%. Will continue current regimen for her COPD and followup in one month.

## 2012-04-25 NOTE — Assessment & Plan Note (Signed)
BP Readings from Last 3 Encounters:  04/25/12 116/60  04/21/12 144/58  04/07/12 140/76    Lab Results  Component Value Date   NA 140 04/21/2012   K 4.4 04/21/2012   CREATININE 1.3* 04/21/2012    Assessment:  Blood pressure control: controlled  Progress toward BP goal:  at goal  Comments:   Plan:  Medications:  continue current medications  Educational resources provided: brochure  Self management tools provided:   brochure  Other plans: Blood pressure is well controlled. Will continue current regimen (amlodipine 10 mg daily, Coreg 12.5 mg daily, Diovan 80 mg daily, Lasix 20 mg daily)

## 2012-04-25 NOTE — Progress Notes (Signed)
Patient ID: Melissa Villa, female   DOB: 1946-01-31, 67 y.o.   MRN: [id number]  Subjective:   Patient ID: Melissa Villa female   DOB: 1945/07/23 67 y.o.   MRN: [id number]  HPI: Melissa Villa is a 67 y.o. with a past medical history as outlined below, who presents for a hospital followup visit.   Patient was recently hospitalized because of acute respiratory failure from a 12/10 to 06/07/11. She was treated with antibiotics and intubated. She was discharged at stable condition. She completed a course of prednisone after discharge. Today patient reports that she feels better. Currently she is using albuterol, Advair, Spiriva, Nasonex and Pulmicort at home. She still have shortness of breath and is using 2 L of oxygen at home, but her shortness of breath is at her baseline. She does not have chest pain, productive cough, fever or chills.    Patient has a history of left breast cancer. She was diagnosed as having R  breast cancer at 01/2011. She is receiving chemotherapy. She has just visited oncology clinic at 04/21/12 and received the Herceptin injection on the same day. She also had her CBC checked. Her CBC showed hemoglobin 8.6 which is at her baseline.  Regarding her HTN, it is well controlled. Today blood pressure is normal.  Regarding DM-II, she is using Lantus 42 units daily, metformin 850 3 times a day and Januvia. His recent A1c was 7.0 and 01/24/12. Patient does not have symptoms of hypo-or hyper ischemia.  Regarding his CHF, patient does not have chest pain or palpitation. Her shortness of breath is at baseline. She does not have worsening fluid retention. Her leg edema is minimal which did not get worse recently.  Patient reports that she feels generalized weak after discharged from the hospital. She has good sleep and body weight has been stable   Past Medical History  Diagnosis Date  . CHF (congestive heart failure)      2-D echo 02/19/2009 showed left ventricle cavity size  normal; systolic function normal, estimated ejection fraction 55%; wall motion normal; no regional wall motion abnormalities; PA peak pressure 47mm Hg.  [name] COPD (chronic obstructive pulmonary disease)     Pulmonary function tests on 08/05/2010 showed mixed obstructive and restrictive lung disease with no significant response to bronchodilator, and decreased diffusion capacity that corrects to normal range when adjusted for the inhaled alveolar volume.  . Degenerative joint disease   . AV block, complete     S/P dual-chamber for symptomatic episodes of bradycardia and complete heart block.  . Pacemaker     S/P dual-chamber for symptomatic episodes of bradycardia and complete heart block.  . Adenomatous polyp of colon     Tubular adenomas X 2 found 06/1999; negative screening colonoscopy 02/13/2004 by Dr. [name].  . Hypercholesteremia   . Obstructive sleep apnea     Baseline diagnostic nocturnal polysomnogram on July 27, 2005 showed severe obstructive sleep apnea/hypopnea syndrome, with AHI 153.2 per hour.  Last nocturnal polysomnogram done for CPAP titration was 05/01/2009; CPAP was titrated to 13 CWP, AHI 1.8 per hour using a large Respironics FitLife Full Face Mask with heated humidifier.  . Peripheral autonomic neuropathy due to diabetes mellitus   . 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 [name]  . Constipation   . Hand pain   . Palpitation   . Rib pain     right sided   . DM type  2 (diabetes mellitus, type 2)   . Skin rash   . Urinary incontinence   . Proliferative diabetic retinopathy(362.02)     S/P panretinal photocoagulation by Dr. [name]  . Vitreous hemorrhage     With tractional detachment, left eye; S/P posterior vitrectomy with membrane peel by Dr. [name] 04/06/2005  . Dyspnea on exertion     Cardiac cath 06/17/2005 by Dr. [name] showed LVEF 50-55%, 30% proximal LAD stenosis, small diagonals.   . Shingles rash 03/25/11    . 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 Followed by Dr. [name].  . Breast cancer, stage 3 102/13    right hx lumpectomy 1994, bx today=invasive ductal ca  . Allergy     thinitis  . Asthma   . Bronchitis     hx  . Cataract     b/l surgery  . DM hyperosmolarity type II   . Hypertension   . COPD (chronic obstructive pulmonary disease)   . Diastolic CHF   . TIA (transient ischemic attack)   . OSA (obstructive sleep apnea)   . Chronic respiratory failure with hypoxia   . Anemia   . DJD (degenerative joint disease)   . AV block   . DM retinopathy   . DM neuropathies   . Carcinoid tumor of lung    Current Outpatient Prescriptions  Medication Sig Dispense Refill  . ADVAIR DISKUS 250-50 MCG/DOSE AEPB INHALE ONE PUFF INTO THE LUNGS TWICE    DAILY  60 each  5  . AGGRENOX 25-200 MG per 12 hr capsule TAKE ONE CAPSULE TWICE DAILY  62 capsule  5  . albuterol (PROAIR HFA) 108 (90 BASE) MCG/ACT inhaler Inhale 2 puffs into the lungs 4 (four) times daily as needed for wheezing or shortness of breath.  1 Inhaler  11  . albuterol (PROVENTIL) (2.5 MG/3ML) 0.083% nebulizer solution Take 3 mLs (2.5 mg total) by nebulization every 6 (six) hours as needed for wheezing or shortness of breath.  75 mL  12  . amLODipine (NORVASC) 10 MG tablet Take 10 mg by mouth daily before breakfast.      . Bepotastine Besilate (BEPREVE) 1.5 % SOLN Place 1 drop into both eyes 2 (two) times daily.      . budesonide (PULMICORT) 0.5 MG/2ML nebulizer solution Take 2 mLs (0.5 mg total) by nebulization 2 (two) times daily.  2 mL  3  . calcium-vitamin D (OSCAL WITH D) 250-125  MG-UNIT per tablet Take 1 tablet by mouth daily.      . carvedilol (COREG) 12.5 MG tablet Take 2 tablets (25 mg total) by mouth 2 (two) times daily.  120 tablet  6  . cetirizine (ZYRTEC) 10 MG tablet Take 10 mg by mouth daily as needed. For seasonal allergies      . DIOVAN 80 MG tablet TAKE 2 TABLETS 2 TIMES A DAY DAY.  120 tablet  5  . furosemide (LASIX) 20 MG tablet Take 1 tablet (20 mg total) by mouth daily.  30 tablet  1  . insulin glargine (LANTUS SOLOSTAR) 100 UNIT/ML injection Inject 42 Units into the skin daily.  15 mL  12  . metFORMIN (GLUCOPHAGE) 850 MG tablet Take 850 mg by mouth 3 (three) times daily.      . Multiple Vitamin (MULTIVITAMIN WITH MINERALS) TABS Take 1 tablet by mouth daily.      [name] oxyCODONE (OXY IR/ROXICODONE) 5 MG immediate release tablet Take 1-2 tablets (5-10 mg total) by mouth every 6 (six) hours as needed for pain.  60 tablet  0  . pregabalin (LYRICA) 200 MG capsule Take 200 mg by mouth 2 (two) times daily. scheduled      . rosuvastatin (CRESTOR) 5 MG tablet Take 10 mg by mouth daily.      . sitaGLIPtin (JANUVIA) 100 MG tablet Take 100 mg by mouth daily.      [name] tiotropium (SPIRIVA) 18 MCG inhalation capsule Place 1 capsule (18 mcg total) into inhaler and inhale daily.  30 capsule  11  . traMADol (ULTRAM) 50 MG tablet Take 1 tablet (50 mg total) by mouth every 6 (six) hours as needed for pain (cough).  60 tablet  2  . Dexlansoprazole (DEXILANT) 60 MG capsule Take 60 mg by mouth every morning. Take 15 minutes before breakfast.      . lidocaine-prilocaine (EMLA) cream Apply a thumbnail size to port site at least one prior to procedures. Cover with plastic wrap.  30 g  1  . mometasone (NASONEX) 50 MCG/ACT nasal spray Place 2 sprays into the nose daily. scheduled      . OVER THE COUNTER MEDICATION Take 1 tablet by mouth daily. Otc.Ferrous 45mg       . valsartan (DIOVAN) 160 MG tablet Take 160 mg by mouth 2 (two) times daily.       Family History  Problem Relation Age of  Onset  . Breast cancer Mother 70    Deceased Sep 14, 2002  . Diabetes type II Mother   . Heart attack Brother   . Cervical cancer Neg Hx   . Colon cancer Neg Hx   . Breast cancer Mother   . Diabetes Mellitus II Mother   . Hypertension Brother   . Coronary artery disease Brother   . Stroke Brother   . Diabetes type II Brother   . Hypertension Sister    History   Social History  . Marital Status: Widowed    Spouse Name: N/A    Number of Children: N/A  . Years of Education: N/A   Social History Main Topics  . Smoking status: Former Smoker -- 1.0 packs/day for 15 years    Types: Cigarettes    Quit date: 04/19/2001  . Smokeless tobacco: Never Used  . Alcohol Use: No  . Drug Use: No  . Sexually Active: No   Other Topics Concern  . None   Social History Narrative   ** Merged History Encounter **    Review of Systems:  Feels generalized weak, otherwise as per history of present illness.   Objective:  Physical Exam: Filed Vitals:   04/25/12 1113  BP: 116/60  Pulse: 89  Temp: 99.4 F (37.4 C)  TempSrc: Oral  Height: 5\' 4"  (1.626 m)  Weight: 289 lb 6.4 oz (131.271 kg)   general: not in acute distress HEENT: PERRL, EOMI, no scleral icterus, no JVD Cardiac: S1/S2, RRR, 2+ systolic murmurs, No gallops or rubs Pulm: Good air movement bilaterally, Clear to auscultation bilaterally, No rales, wheezing, rhonchi or rubs. Abd: Soft,  nondistended, nontender, no rebound pain, no organomegaly, BS present Ext: No rashes, has trace edema in legs.  2+DP/PT pulse bilaterally  Musculoskeletal: No joint deformities, erythema, or stiffness, ROM full and no nontender Skin: no rashes. No skin bruise. Neuro: alert and oriented X3, cranial nerves II-XII grossly intact, muscle strength 5/5 in all extremeties,  sensation to light touch intact.  Psych.: patient is not psychotic, no suicidal or hemocidal ideation.   Assessment & Plan:

## 2012-04-25 NOTE — Patient Instructions (Addendum)
You are doing a great job eating healthier!  If you have to eat to increase your blood sugar very often consider calling office and asking for small reduction in insulin dose.  2 improvements I can make to increase my energy and nutrition  1- eat midday or drink nutritional shake like carnation instant breakfast or even just a glass of milk with peanutbutter crackers  2- eat more fruit- bananas can be eaten instead of crackers with peanutbutter on them for snack or meal with canned or  Dried(prunes), 100% orange juice 4 oz each day, and tomato juice  Vernell Leep is [facility] [facility]

## 2012-04-25 NOTE — Patient Instructions (Signed)
1. You have done great job in taking all your medications. I appreciate it very much. Please continue doing that. 2. Please take all medications as prescribed.  3. If you have worsening of your symptoms or new symptoms arise, please call the clinic ([phone]), or go to the ER immediately if symptoms are severe.     

## 2012-04-28 ENCOUNTER — Telehealth: Payer: Self-pay | Admitting: *Deleted

## 2012-04-28 NOTE — Telephone Encounter (Signed)
Olegario Messier, PT advanced PT calls and requests approval for additional visits, 2x week x 2 weeks, is this ok?

## 2012-04-28 NOTE — Telephone Encounter (Signed)
That will be fine. 

## 2012-05-05 ENCOUNTER — Other Ambulatory Visit (HOSPITAL_BASED_OUTPATIENT_CLINIC_OR_DEPARTMENT_OTHER): Payer: Medicare Other | Admitting: Lab

## 2012-05-05 ENCOUNTER — Ambulatory Visit (HOSPITAL_BASED_OUTPATIENT_CLINIC_OR_DEPARTMENT_OTHER): Payer: Medicare Other

## 2012-05-05 VITALS — BP 111/59 | HR 83 | Temp 97.6°F | Resp 20

## 2012-05-05 DIAGNOSIS — Z5112 Encounter for antineoplastic immunotherapy: Secondary | ICD-10-CM

## 2012-05-05 DIAGNOSIS — C50919 Malignant neoplasm of unspecified site of unspecified female breast: Secondary | ICD-10-CM

## 2012-05-05 DIAGNOSIS — Z5111 Encounter for antineoplastic chemotherapy: Secondary | ICD-10-CM

## 2012-05-05 LAB — COMPREHENSIVE METABOLIC PANEL (CC13)
AST: 15 U/L (ref 5–34)
BUN: 19 mg/dL (ref 7.0–26.0)
CO2: 26 mEq/L (ref 22–29)
Calcium: 8.5 mg/dL (ref 8.4–10.4)
Chloride: 105 mEq/L (ref 98–107)
Creatinine: 1.1 mg/dL (ref 0.6–1.1)

## 2012-05-05 LAB — CBC WITH DIFFERENTIAL/PLATELET
BASO%: 0.4 % (ref 0.0–2.0)
HCT: 27.3 % — ABNORMAL LOW (ref 34.8–46.6)
LYMPH%: 25.7 % (ref 14.0–49.7)
MCHC: 29.1 g/dL — ABNORMAL LOW (ref 31.5–36.0)
MCV: 68 fL — ABNORMAL LOW (ref 79.5–101.0)
MONO#: 1 10*3/uL — ABNORMAL HIGH (ref 0.1–0.9)
MONO%: 11.5 % (ref 0.0–14.0)
NEUT%: 61.6 % (ref 38.4–76.8)
Platelets: 232 10*3/uL (ref 145–400)
WBC: 8.5 10*3/uL (ref 3.9–10.3)

## 2012-05-05 LAB — TECHNOLOGIST REVIEW: Technologist Review: 1

## 2012-05-05 MED ORDER — TRASTUZUMAB CHEMO INJECTION 440 MG
4.0000 mg/kg | Freq: Once | INTRAVENOUS | Status: AC
  Administered 2012-05-05: 504 mg via INTRAVENOUS
  Filled 2012-05-05: qty 24

## 2012-05-05 MED ORDER — SODIUM CHLORIDE 0.9 % IJ SOLN
10.0000 mL | INTRAMUSCULAR | Status: DC | PRN
  Administered 2012-05-05: 10 mL
  Filled 2012-05-05: qty 10

## 2012-05-05 MED ORDER — SODIUM CHLORIDE 0.9 % IV SOLN
Freq: Once | INTRAVENOUS | Status: AC
  Administered 2012-05-05: 12:00:00 via INTRAVENOUS

## 2012-05-05 MED ORDER — DIPHENHYDRAMINE HCL 25 MG PO CAPS
50.0000 mg | ORAL_CAPSULE | Freq: Once | ORAL | Status: AC
  Administered 2012-05-05: 50 mg via ORAL

## 2012-05-05 MED ORDER — FULVESTRANT 250 MG/5ML IM SOLN
500.0000 mg | INTRAMUSCULAR | Status: DC
  Administered 2012-05-05: 500 mg via INTRAMUSCULAR
  Filled 2012-05-05: qty 10

## 2012-05-05 MED ORDER — ACETAMINOPHEN 325 MG PO TABS
650.0000 mg | ORAL_TABLET | Freq: Once | ORAL | Status: AC
  Administered 2012-05-05: 650 mg via ORAL

## 2012-05-05 MED ORDER — HEPARIN SOD (PORK) LOCK FLUSH 100 UNIT/ML IV SOLN
500.0000 [IU] | Freq: Once | INTRAVENOUS | Status: AC | PRN
  Administered 2012-05-05: 500 [IU]
  Filled 2012-05-05: qty 5

## 2012-05-05 NOTE — Patient Instructions (Signed)
[facility] Discharge Instructions for Patients Receiving Chemotherapy  Today you received the following chemotherapy agents Herceptin  To help prevent nausea and vomiting after your treatment, we encourage you to take your nausea medication as prescribed.   If you develop nausea and vomiting that is not controlled by your nausea medication, call the clinic. If it is after clinic hours your family physician or the after hours number for the clinic or go to the Emergency Department.   BELOW ARE SYMPTOMS THAT SHOULD BE REPORTED IMMEDIATELY:  *FEVER GREATER THAN 100.5 F  *CHILLS WITH OR WITHOUT FEVER  NAUSEA AND VOMITING THAT IS NOT CONTROLLED WITH YOUR NAUSEA MEDICATION  *UNUSUAL SHORTNESS OF BREATH  *UNUSUAL BRUISING OR BLEEDING  TENDERNESS IN MOUTH AND THROAT WITH OR WITHOUT PRESENCE OF ULCERS  *URINARY PROBLEMS  *BOWEL PROBLEMS  UNUSUAL RASH Items with * indicate a potential emergency and should be followed up as soon as possible.  Feel free to call the clinic you have any questions or concerns. The clinic phone number is [phone].   I have been informed and understand all the instructions given to me. I know to contact the clinic, my physician, or go to the Emergency Department if any problems should occur. I do not have any questions at this time, but understand that I may call the clinic during office hours   should I have any questions or need assistance in obtaining follow up care.   

## 2012-05-05 NOTE — Progress Notes (Signed)
Dr [name] aware Hgb, okay to treat with Herceptin today, no additional orders-dhp, rn

## 2012-05-10 ENCOUNTER — Encounter: Payer: Self-pay | Admitting: Internal Medicine

## 2012-05-11 ENCOUNTER — Other Ambulatory Visit (HOSPITAL_COMMUNITY): Payer: Self-pay

## 2012-05-11 ENCOUNTER — Telehealth: Payer: Self-pay | Admitting: Internal Medicine

## 2012-05-11 NOTE — Telephone Encounter (Signed)
I spoke with pt and is aware of CDY recs. She voiced her understanding  lmtcb x1 for [facility] nurse

## 2012-05-11 NOTE — Telephone Encounter (Signed)
1) increase O2 to 3 L/M 2) Recommend ER for eval of suspected CHF causing increased dyspnea.

## 2012-05-11 NOTE — Telephone Encounter (Signed)
Per Olegario Messier, [facility] nurse, the pt's sats are reading 85-86% on 2L at rest. After diaphragmatic breathing exercises her sats rise to 91-92% on 2L but drop right back into the mid 80's with regular breathing. BP 106/58, P 71, Resp 27 and Temp 98.3. She does sound a little congested but her cough is not productive. She is weak and has decreased appetite.   Pt does have a pending appt with CY on Tues., 05/16/12. Pls advise of any recs.

## 2012-05-12 ENCOUNTER — Encounter: Payer: Self-pay | Admitting: *Deleted

## 2012-05-12 NOTE — Telephone Encounter (Signed)
Olegario Messier at [facility] advised. Carron Curie, CMA

## 2012-05-16 ENCOUNTER — Ambulatory Visit (INDEPENDENT_AMBULATORY_CARE_PROVIDER_SITE_OTHER): Payer: Medicare Other | Admitting: Internal Medicine

## 2012-05-16 ENCOUNTER — Ambulatory Visit (INDEPENDENT_AMBULATORY_CARE_PROVIDER_SITE_OTHER)
Admission: RE | Admit: 2012-05-16 | Discharge: 2012-05-16 | Disposition: A | Discharge status: Home / Self Care | Payer: Medicaid Other | Source: Ambulatory Visit | Attending: Internal Medicine | Admitting: Internal Medicine

## 2012-05-16 ENCOUNTER — Other Ambulatory Visit: Payer: Self-pay | Admitting: Internal Medicine

## 2012-05-16 ENCOUNTER — Encounter: Payer: Self-pay | Admitting: Internal Medicine

## 2012-05-16 VITALS — BP 112/72 | HR 88 | Ht 64.0 in | Wt 293.6 lb

## 2012-05-16 DIAGNOSIS — J984 Other disorders of lung: Secondary | ICD-10-CM

## 2012-05-16 DIAGNOSIS — J441 Chronic obstructive pulmonary disease with (acute) exacerbation: Secondary | ICD-10-CM

## 2012-05-16 DIAGNOSIS — I509 Heart failure, unspecified: Secondary | ICD-10-CM

## 2012-05-16 DIAGNOSIS — G4733 Obstructive sleep apnea (adult) (pediatric): Secondary | ICD-10-CM

## 2012-05-16 DIAGNOSIS — J449 Chronic obstructive pulmonary disease, unspecified: Secondary | ICD-10-CM

## 2012-05-16 DIAGNOSIS — J9600 Acute respiratory failure, unspecified whether with hypoxia or hypercapnia: Secondary | ICD-10-CM

## 2012-05-16 DIAGNOSIS — J9690 Respiratory failure, unspecified, unspecified whether with hypoxia or hypercapnia: Secondary | ICD-10-CM

## 2012-05-16 MED ORDER — LEVALBUTEROL HCL 0.63 MG/3ML IN NEBU
0.6300 mg | INHALATION_SOLUTION | Freq: Once | RESPIRATORY_TRACT | Status: AC
  Administered 2012-05-16: 0.63 mg via RESPIRATORY_TRACT

## 2012-05-16 NOTE — Progress Notes (Signed)
Quick Note:  Spoke with patient-aware of results and recommendations from CY to increased Lasix to 40 mg QD and get appt this week with clinic. Pt verbalized her understanding to go to nearest ER if she gets worse. ______

## 2012-05-16 NOTE — Assessment & Plan Note (Signed)
Body habitus favors obesity hypoventilation syndrome.

## 2012-05-16 NOTE — Assessment & Plan Note (Signed)
CXR today reviewed after patient left. It shows cardiomegaly, R effusion, pulmonary edema. Plan- We reached her at home, instructing increase lasix to 40 mg daily and early f/u at [facility] outpatient clinic. I don't see cardiologist listed for her. If she doesn't respond quickly to lasix she knows to go to ER.

## 2012-05-16 NOTE — Assessment & Plan Note (Signed)
Compliant CPAP 13, O2 2L/ APS

## 2012-05-16 NOTE — Patient Instructions (Addendum)
Neb xop 0.63  Order - incentive spirometer    Inhale deeply 4 times per session, 3 sessions per day to open your lungs  Order CXR   Dx Respiratory failure, COPD, CHF

## 2012-05-16 NOTE — Progress Notes (Signed)
Patient ID: CARMINA WALLE, female    DOB: 10-23-1945, 67 y.o.   MRN: [id number]  HPI 10/12/10- 58 yoF former smoker seen at kind request of Dr Meredith Pel for sleep evaluation. She complains of difficulty maintaining sleep, waking 4-5 x/ night. Has had 2 sleep studies positive for OSA. Sleeps every night with CPAP, but can't maintain sleep and fights daytime somnolence.  CPAP keeps her awake, but cna't fall asleep without it. The mask cuts her nose and leaks. Has tried Ambien, Rozerem and others. Lives alone and unsure about snoring or leg movement.  Has had a significant cough with a cold but feeling better. R lobectomy 2003 for ? Cancer vs benign nodule. Lumpectomy breast for cancer.  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 She is willing "because I want some rest". New full face mask fits better.   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 Key was that her O2 sat wouldn't stay up even on 1 L/M, recording 73 minutes less than 88%. I explained this may be why she feels unable to sleep well.  She had restrictive lung disease on pulmonary function testing 08/05/2010 which may reflect her lobectomy as well as radiation therapy for her breast cancer and her obesity.  02/03/11-  47 yoF former smoker followed for OSA, restrictive lung disease/ hx R lobectomy/ Carcinoid tumor, complicated by hx breast Ca She is using CPAP at 11 CWP plus oxygen at 2 L/Advanced all night every night. Insomnia remains a problem, she doesn't sleep soundly through the night and is sleepy without naps in the daytime. Temazepam 15 mg is not enough. She takes 2 hours to fall asleep and then if she  wakes to the bathroom she again needs a long time before she can return to sleep.  03/25/11-   26 yoF former smoker followed for OSA, restrictive lung disease/ hx R lobectomy/ Carcinoid tumor, complicated by hx breast Ca Acute visit- 2 weeks ago developed pruritic burning rash on the left ribs. We called in valacyclovirand a prednisone taper based on her verbal description and she comes is requested to let us see the rash. 2 small children weather today, neither of whom has had chickenpox  05/05/11- 65 yoF former smoker followed for OSA, restrictive lung disease/ hx R lobectomy/ Carcinoid tumor, complicated by hx breast Ca  Benzonatate has been a big help but she still coughs some- dry.  CXR 06/22/2010 showed normal heart mild vascular congestion and pacemaker with surgical clips in both axillae.  With clonazepam she has less waking.  She wants to know why she is waking up and we talked about her sleep apnea status that she continues CPAP. We also discussed whether Advair at night would be keeping her awake.  07/28/11-  13 yoF former smoker followed for OSA, restrictive lung disease/ hx R lobectomy/ Carcinoid tumor, complicated by hx breast Ca. Still having increased SOB with walking and gives out quickly(even making bed or washing dishes).Also gets headaches with it. Stays tired, not relieved by sleep. Feels "busy brain" although she dreams. Not taking clonazepam. Does use CPAP with oxygen every night. Insurance won't pay for benzonatate. Still uses Spiriva, Advair, rescue inhaler.  Off of amitriptyline. GERD is under much better control with Paxil and she burps a lot. Chest x-ray 06/28/2011-no active disease. Left pacemaker. Postoperative changes. IMPRESSION:  1. Stable postop change involving the right hemithorax.  Original Report Authenticated By: Rosealee Albee, M.D.    12/24/11- 22 yoF former smoker followed for OSA, restrictive lung disease/ hx R lobectomy/ Carcinoid tumor, complicated by hx  breast Ca. ACUTE VISIT: increased SOB during day now; feels full in chest-unable to cough anything up; uses O2 at night only; unsure of pressure for CPAP-wakes up coughing(has done this chronically). CPAP 13 with O2 2 L/Advanced Not aware of reflux. Uses rescue inhaler at least once or twice daily. Denies pain or swelling in legs. Feels that her heart "labors" during exercise without palpitation or chest pain. COPD assessment test (CAT) 36/40  03/14/12- 66 yoF former smoker followed for OSA, restrictive lung disease/ hx R lobectomy/ Carcinoid tumor, complicated by hx breast Ca.   Female companion She emphasizes that breathing is much better. Chest x-ray September had shown CHF. Now she is having much less cough. [facility], spent time at nursing facility and goes home tomorrow. Hospital diagnoses were acute on chronic respiratory failure, acute pulmonary edema, diabetes. History of recurrent right breast cancer with chemotherapy. CPAP 11 + O2 2l/Advanced used every night. CXR 12/24/11-reviewed *RADIOLOGY REPORT*  Clinical Data: Shortness of breath and cough. COPD. Asthma.  CHEST - 2 VIEW  Comparison: 06/28/2011  Findings: There is new cardiomegaly with pulmonary vascular  congestion and mild interstitial edema with tiny bilateral pleural  effusions. Findings are consistent with congestive heart failure.  Dual lead pacer in place. No acute osseous abnormality.  IMPRESSION:  Congestive heart failure.  Original Report Authenticated By: Gwynn Burly, M.D.   05/16/12- 3 yoF former smoker followed for OSA, restrictive lung disease/ hx R lobectomy/ Carcinoid tumor, complicated by hx breast Ca( Dr [name]). Follows For: Wearing Cpap 7 hrs/night - Mask fits ok - SOB about the same - Occas non prod cough and wheezing Hosp f/u- hosp twice, Nov& Dec, most recently 12/10-12/18 @ [facility] w/ vent dep resp failure, MRSA pneumonia. Loculated R pleural effusion. Now has R  pacer, as well as L Portacath.  Continues CPAP 13, O2 2L/ APS all night every night. Continuous O2. Reports nonproductive cough, less ankle edema. Home neb helps (alb + budesonide). Advair little effect.   Review of Systems- see HPI Constitutional:   No weight loss, night sweats,  Fevers, chills, fatigue, lassitude. HEENT:   No headaches,  Difficulty swallowing,  Tooth/dental problems,  Sore throat,                No sneezing, itching, ear ache, nasal congestion, post nasal drip,  CV:  No chest pain, +orthopnea, PND, +swelling in lower extremities, No-anasarca, dizziness, palpitations GI  No heartburn, indigestion, abdominal pain, nausea, vomiting,  Resp: +short of breath.   +Non-productive cough,  No coughing up of blood.  No change in color of mucus.  No wheezing.  Skin: . No rash GU:  MS: Psych:  No change in mood or affect. No depression or anxiety.  No memory loss.  Objective:   Physical Exam  BP 112/72  Pulse 88  Ht 5\' 4"  (1.626 m)  Wt 293 lb 9.6 oz (133.176 kg)  BMI 50.40 kg/m2  SpO2 91% General- Alert, Oriented, Affect-appropriate, Distress- none acute, +morbidly obese, wheelchair, O2 2L Skin-. Lymphadenopathy- none Head- atraumatic Eyes- Gross vision intact,  PERRLA, conjunctivae clear secretions Ears- Hearing, canals, Tm normal Nose- Clear, No- Septal dev, mucus, polyps, erosion, perforation   Scar bridge of nose Throat- Mallampati II , mucosa clear , drainage- none, tonsils- atrophic Neck- flexible , trachea midline, no stridor , thyroid nl, carotid no bruit   Heart/CV- RRR , 1/6 systolic murmur right upper sternal area , no gallop  , no rub, nl s1     - JVD- trace , edema 2+, stasis changes- none, varices- none  Lung- clear to P&A, + harsh cough ; wheeze- none,  , Obese- poor airflow, rub- none    Chest wall- R pacemaker, L portacath, L mastectomy Abd-  Br/ Gen/ Rectal- Not done, not indicated Extrem-

## 2012-05-16 NOTE — Progress Notes (Signed)
Quick Note:  LMTCB ______ 

## 2012-05-16 NOTE — Assessment & Plan Note (Signed)
Recent hosp for pneumonia and vent dep resp failure. Looks non-acutely marginal, wheel chair and O2. Need to know if there is something reversible going on. Exam limited by her size, but she is not in pain, not obviously infected. Harsh cough sounds as if cough could be more productive.  Plan CXR, neb xop, incentive spirometer

## 2012-05-17 ENCOUNTER — Telehealth: Payer: Self-pay | Admitting: *Deleted

## 2012-05-17 NOTE — Telephone Encounter (Signed)
Pt called back she will come tomorrow at 1315 dr Clyde Lundborg per Charlynn Grimes., she is cautioned  that if she feels worse to please go to ED or urg care, she is agreeable.

## 2012-05-17 NOTE — Telephone Encounter (Signed)
Called pt 3 times, finally spoke with her, she is feeling about the same, not worse, maybe a little better. She will try to arrange transportation for tomorrow. She will call me back this pm. She is also ask to keep appt w/ dr Meredith Pel 2/5 and is agreeable

## 2012-05-17 NOTE — Telephone Encounter (Signed)
Agree with plan 

## 2012-05-18 ENCOUNTER — Ambulatory Visit (INDEPENDENT_AMBULATORY_CARE_PROVIDER_SITE_OTHER): Payer: Medicare Other | Admitting: Internal Medicine

## 2012-05-18 ENCOUNTER — Other Ambulatory Visit: Payer: Self-pay | Admitting: *Deleted

## 2012-05-18 ENCOUNTER — Ambulatory Visit (HOSPITAL_COMMUNITY)
Admission: RE | Admit: 2012-05-18 | Discharge: 2012-05-18 | Disposition: A | Discharge status: Home / Self Care | Payer: Medicare Other | Source: Ambulatory Visit | Attending: Internal Medicine | Admitting: Internal Medicine

## 2012-05-18 VITALS — BP 100/64 | HR 86 | Temp 97.0°F | Ht 64.0 in | Wt 294.0 lb

## 2012-05-18 DIAGNOSIS — J449 Chronic obstructive pulmonary disease, unspecified: Secondary | ICD-10-CM | POA: Insufficient documentation

## 2012-05-18 DIAGNOSIS — Z85118 Personal history of other malignant neoplasm of bronchus and lung: Secondary | ICD-10-CM | POA: Insufficient documentation

## 2012-05-18 DIAGNOSIS — Z902 Acquired absence of lung [part of]: Secondary | ICD-10-CM | POA: Insufficient documentation

## 2012-05-18 DIAGNOSIS — E119 Type 2 diabetes mellitus without complications: Secondary | ICD-10-CM | POA: Insufficient documentation

## 2012-05-18 DIAGNOSIS — Z950 Presence of cardiac pacemaker: Secondary | ICD-10-CM | POA: Insufficient documentation

## 2012-05-18 DIAGNOSIS — I509 Heart failure, unspecified: Secondary | ICD-10-CM | POA: Insufficient documentation

## 2012-05-18 DIAGNOSIS — J9819 Other pulmonary collapse: Secondary | ICD-10-CM | POA: Insufficient documentation

## 2012-05-18 DIAGNOSIS — R0602 Shortness of breath: Secondary | ICD-10-CM | POA: Insufficient documentation

## 2012-05-18 DIAGNOSIS — I10 Essential (primary) hypertension: Secondary | ICD-10-CM | POA: Insufficient documentation

## 2012-05-18 DIAGNOSIS — J4489 Other specified chronic obstructive pulmonary disease: Secondary | ICD-10-CM | POA: Insufficient documentation

## 2012-05-18 DIAGNOSIS — I517 Cardiomegaly: Secondary | ICD-10-CM | POA: Insufficient documentation

## 2012-05-18 DIAGNOSIS — R52 Pain, unspecified: Secondary | ICD-10-CM

## 2012-05-18 MED ORDER — POTASSIUM CHLORIDE ER 10 MEQ PO TBCR: EXTENDED_RELEASE_TABLET | ORAL | Status: DC

## 2012-05-18 MED ORDER — OXYCODONE HCL 5 MG PO TABS: 5.0000 mg | ORAL_TABLET | Freq: Four times a day (QID) | ORAL | Status: DC | PRN

## 2012-05-18 MED ORDER — PREGABALIN 200 MG PO CAPS: 200.0000 mg | ORAL_CAPSULE | Freq: Two times a day (BID) | ORAL | Status: AC

## 2012-05-18 NOTE — Patient Instructions (Signed)
1. Please do not take K-dur (potassium pill) until I call you and tell you how to take it.,  2. Please take all medications as prescribed.  3. If you have worsening of your symptoms or new symptoms arise, please call the clinic ([phone]), or go to the ER immediately if symptoms are severe.  You have done great job in taking all your medications. I appreciate it very much. Please continue doing that.     Orthostatic Hypotension Orthostatic hypotension is a sudden fall in blood pressure. It occurs when a person goes from a sitting or lying position to a standing position. CAUSES   Loss of body fluids (dehydration).  Medicines that lower blood pressure.  Sudden changes in posture, such as sudden standing when you have been sitting or lying down.  Taking too much of your medicine. SYMPTOMS   Lightheadedness or dizziness.  Fainting or near-fainting.  A fast heart rate (tachycardia).  Weakness.  Feeling tired (fatigue). DIAGNOSIS  Your caregiver may find the cause of orthostatic hypotension through:  A history and/or physical exam.  Checking your blood pressure. Your caregiver will check your blood pressure when you are:  Lying down.  Sitting.  Standing.  Tilt table testing. In this test, you are placed on a table that goes from a lying position to a standing position. You will be strapped to the table. This test helps to monitor your blood pressure and heart rate when you are in different positions. TREATMENT   If orthostatic hypotension is caused by your medicines, your caregiver will need to adjust your dosage. Do not stop or adjust your medicine on your own.  When changing positions, make these changes slowly. This allows your body to adjust to the different position.  Compression stockings that are worn on your lower legs may be helpful.  Your caregiver may have you consume extra salt. Do not add extra salt to your diet unless directed by your caregiver.  Eat  frequent, small meals. Avoid sudden standing after eating.  Avoid hot showers or excessive heat.  Your caregiver may give you fluids through the vein (intravenous).  Your caregiver may put you on medicine to help enhance fluid retention. SEEK IMMEDIATE MEDICAL CARE IF:   You faint or have a near-fainting episode. Call your local emergency services (911 in [place]  You have or develop chest pain.  You feel sick to your stomach (nauseous) or vomit.  You have a loss of feeling or movement in your arms or legs.  You have difficulty talking, slurred speech, or you are unable to talk.  You have difficulty thinking or have confused thinking. MAKE SURE YOU:   Understand these instructions.  Will watch your condition.  Will get help right away if you are not doing well or get worse. Document Released: 03/26/2002 Document Revised: 06/28/2011 Document Reviewed: 07/19/2008 [facility] Patient Information 2013 [place], [name].

## 2012-05-18 NOTE — Progress Notes (Addendum)
Patient ID: Melissa Villa, female   DOB: June 04, 1945, 67 y.o.   MRN: [id number] Subjective:   Patient ID: Melissa Villa female   DOB: [birth year] 67 y.o.   MRN: [id number]  CC:  Follow up visit for CHF and COPD HPI:  Ms.Melissa Villa is a 67 y.o. lady with past medical history as outlined below, who presents for a followup visit today  Patient was recently hospitalized because of acute respiratory failure from a 12/10 to 06/07/11. She was treated with antibiotics and intubated. She was discharged at stable condition. She completed a course of prednisone after discharge.   I saw her in clinic for HFU on 04/26/11. She had SOB and occasional nonprodutive cough. We continued her COPD regimen and lasix 20 mg daily. Today she reports that her shortness of breath did not get better, but remains similar to last time when I saw her in clinic. Patient reported that her leg edema is little worse. She uses 3 pillows to sleep at night. She also use 2L oxygen at home. She uses CPAP at night. She has orthopnea. She could not tell whether she has PND because she is using CPAP in night. She does not have chest pain or palpitation. She does not have any pain over her calf areas.   Patient was just seen by her pulmonologist, Dr. Maple Hudson on 05/16/12. She did chest x-ray which showed pulmonary edema bilaterally. Her Lasix dose was increased from 20 mg daily to 40 mg daily by Dr. Maple Hudson. She reports that she took one dose of 40 mg Lasix yesterday and urinate a lot. She felt a little bit of lightheaded yesterday, but today she feels okay. She did not notice significant improvement for her shortness of breath.   Denies fever, chills,headaches, abdominal pain,diarrhea, constipation, dysuria, urgency, frequency, hematuria.   Past Medical History  Diagnosis Date  . CHF (congestive heart failure)      2-D echo 02/19/2009 showed left ventricle cavity size normal; systolic function normal, estimated ejection fraction 55%; wall  motion normal; no regional wall motion abnormalities; PA peak pressure 47mm Hg.  [name] COPD (chronic obstructive pulmonary disease)     Pulmonary function tests on 08/05/2010 showed mixed obstructive and restrictive lung disease with no significant response to bronchodilator, and decreased diffusion capacity that corrects to normal range when adjusted for the inhaled alveolar volume.  . Degenerative joint disease   . AV block, complete     S/P dual-chamber for symptomatic episodes of bradycardia and complete heart block.  . Pacemaker     S/P dual-chamber for symptomatic episodes of bradycardia and complete heart block.  . Adenomatous polyp of colon     Tubular adenomas X 2 found 06/1999; negative screening colonoscopy 02/13/2004 by Dr. [name].  . Hypercholesteremia   . Obstructive sleep apnea     Baseline diagnostic nocturnal polysomnogram on July 27, 2005 showed severe obstructive sleep apnea/hypopnea syndrome, with AHI 153.2 per hour.  Last nocturnal polysomnogram done for CPAP titration was 05/01/2009; CPAP was titrated to 13 CWP, AHI 1.8 per hour using a large Respironics FitLife Full Face Mask with heated humidifier.  . Peripheral autonomic neuropathy due to diabetes mellitus   . 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 [name]  . Constipation   . Hand pain   . Palpitation   . Rib pain     right sided   . DM type 2 (diabetes mellitus, type 2)   .  Skin rash   . Urinary incontinence   . Proliferative diabetic retinopathy(362.02)     S/P panretinal photocoagulation by Dr. [name]  . Vitreous hemorrhage     With tractional detachment, left eye; S/P posterior vitrectomy with membrane peel by Dr. [name] 04/06/2005  . Dyspnea on exertion     Cardiac cath 06/17/2005 by Dr. [name] showed LVEF 50-55%, 30% proximal LAD stenosis, small diagonals.   . Shingles rash 03/25/11  . 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 Followed by Dr. [name].  . Breast cancer, stage 3 102/13    right hx lumpectomy 1994, bx today=invasive ductal ca  . Allergy     thinitis  . Asthma   . Bronchitis     hx  . Cataract     b/l surgery  . DM hyperosmolarity type II   . Hypertension   . COPD (chronic obstructive pulmonary disease)   . Diastolic CHF   . TIA (transient ischemic attack)   . OSA (obstructive sleep apnea)   . Chronic respiratory failure with hypoxia   . Anemia   . DJD (degenerative joint disease)   . AV block   . DM retinopathy   . DM neuropathies   . Carcinoid tumor of lung    Current Outpatient Prescriptions  Medication Sig Dispense Refill  . ADVAIR DISKUS 250-50 MCG/DOSE AEPB INHALE ONE PUFF INTO THE LUNGS TWICE    DAILY  60 each  5  . AGGRENOX 25-200 MG per 12 hr capsule TAKE ONE CAPSULE TWICE DAILY  62 capsule  5  . albuterol (PROAIR HFA) 108 (90 BASE) MCG/ACT inhaler Inhale 2 puffs into the lungs 4 (four) times daily as needed for wheezing or shortness of breath.  1 Inhaler  11  . albuterol (PROVENTIL) (2.5 MG/3ML) 0.083% nebulizer solution Take 3 mLs (2.5 mg total) by nebulization every 6 (six) hours as needed for wheezing or shortness of breath.  75 mL  12  . amLODipine (NORVASC) 10 MG tablet Take 10 mg by mouth daily before breakfast.      . Bepotastine Besilate (BEPREVE) 1.5 % SOLN Place 1 drop into both eyes 2 (two) times daily.      . budesonide (PULMICORT) 0.5 MG/2ML nebulizer solution Take 2 mLs (0.5 mg total) by nebulization 2 (two) times daily.  2 mL  3  . calcium-vitamin D (OSCAL WITH D) 250-125 MG-UNIT per tablet Take 1 tablet by mouth daily.      . carvedilol (COREG)  12.5 MG tablet Take 2 tablets (25 mg total) by mouth 2 (two) times daily.  120 tablet  6  . cetirizine (ZYRTEC) 10 MG tablet Take 10 mg by mouth daily as needed. For seasonal allergies      . Dexlansoprazole (DEXILANT) 60 MG capsule Take 60 mg by mouth every morning. Take 15 minutes before breakfast.      . DIOVAN 80 MG tablet TAKE 2 TABLETS 2 TIMES A DAY DAY.  120 tablet  5  . furosemide (LASIX) 20 MG tablet TAKE ONE (1) TABLET EACH DAY  30 tablet  2  . insulin glargine (LANTUS SOLOSTAR) 100 UNIT/ML  injection Inject 42 Units into the skin daily.  15 mL  12  . lidocaine-prilocaine (EMLA) cream Apply a thumbnail size to port site at least one prior to procedures. Cover with plastic wrap.  30 g  1  . metFORMIN (GLUCOPHAGE) 850 MG tablet Take 850 mg by mouth 3 (three) times daily.      . mometasone (NASONEX) 50 MCG/ACT nasal spray Place 2 sprays into the nose daily. scheduled      . Multiple Vitamin (MULTIVITAMIN WITH MINERALS) TABS Take 1 tablet by mouth daily.      [name] OVER THE COUNTER MEDICATION Take 1 tablet by mouth daily. Otc.Ferrous 45mg       . oxyCODONE (OXY IR/ROXICODONE) 5 MG immediate release tablet Take 1-2 tablets (5-10 mg total) by mouth every 6 (six) hours as needed for pain.  60 tablet  0  . potassium chloride (K-DUR) 10 MEQ tablet Please do not take this medication until I call you and tell you how to take it.  20 tablet  0  . pregabalin (LYRICA) 200 MG capsule Take 1 capsule (200 mg total) by mouth 2 (two) times daily.  60 capsule  1  . rosuvastatin (CRESTOR) 5 MG tablet Take 10 mg by mouth daily.      . sitaGLIPtin (JANUVIA) 100 MG tablet Take 100 mg by mouth daily.      [name] tiotropium (SPIRIVA) 18 MCG inhalation capsule Place 1 capsule (18 mcg total) into inhaler and inhale daily.  30 capsule  11  . traMADol (ULTRAM) 50 MG tablet Take 1 tablet (50 mg total) by mouth every 6 (six) hours as needed for pain (cough).  60 tablet  2   Family History  Problem Relation Age of Onset  .  Breast cancer Mother 92    Deceased 2002-09-26  . Diabetes type II Mother   . Heart attack Brother   . Cervical cancer Neg Hx   . Colon cancer Neg Hx   . Breast cancer Mother   . Diabetes Mellitus II Mother   . Hypertension Brother   . Coronary artery disease Brother   . Stroke Brother   . Diabetes type II Brother   . Hypertension Sister    History   Social History  . Marital Status: Widowed    Spouse Name: N/A    Number of Children: N/A  . Years of Education: N/A   Social History Main Topics  . Smoking status: Former Smoker -- 1.0 packs/day for 15 years    Types: Cigarettes    Quit date: 04/19/2001  . Smokeless tobacco: Never Used  . Alcohol Use: No  . Drug Use: No  . Sexually Active: No   Other Topics Concern  . Not on file   Social History Narrative   ** Merged History Encounter **     Review of Systems: As per HPI  Objective:  Physical Exam: Filed Vitals:   05/18/12 1338  BP: 100/64  Pulse: 86  Temp: 97 F (36.1 C)  TempSrc: Oral  Height: 5\' 4"  (1.626 m)  Weight: 294 lb (133.358 kg)  SpO2: 86%   HEENT: PERRL, EOMI, no scleral icterus, no JVD Cardiac: S1/S2, RRR, 1+ systolic murmurs, No gallops or rubs Pulm: Good air movement bilaterally, right lung is clear to auscultation. Left lung has scattered crackles, no wheezing or rubs.  No rales, wheezing, rhonchi or rubs. Abd: Soft,  nondistended, nontender, no rebound pain, no organomegaly, BS present Ext: No rashes, has 1+ pitting edema in  legs.  2+DP/PT pulse bilaterally Musculoskeletal: No joint deformities, erythema, or stiffness, ROM full and no nontender Skin: no rashes. No skin bruise. Neuro: alert and oriented X3, cranial nerves II-XII grossly intact, muscle strength 5/5 in all extremeties,  sensation to light touch intact.   Psych.: patient is not psychotic, no suicidal or hemocidal ideation.    Assessment & Plan:   Addendum:  Patient's Cre is near her baseline. K is normal. Patient has an  appointment on 05/25/11. Patient needs to have BMP in next visit.  Lorretta Harp, MD PGY2, Internal Medicine Teaching Service Pager: [phone]

## 2012-05-18 NOTE — Assessment & Plan Note (Signed)
It is likely that the patient has mild CHF exacerbation. Her chest x-ray on 05/17/11 showed pulmonary edema. Her body weight increased by 5 pounds. Her leg edema is also worse than last time I saw patient. Her auscultation has a crackles on the left side. I discussed with Dr. Dalphine Handing, we decide to:  -Continue current CHF regimen -Continue with increased dose of Lasix (40 mg daily) and check BMP. I gave patient a prescription for potassium, but asked her to hold until I call her after I got BMP back -Repeat chest x-ray -Patient is asked to come back in one week for reevaluation.

## 2012-05-18 NOTE — Assessment & Plan Note (Signed)
Patient's COPD seems to be stable. She does not have a productive cough and no chest pain. Lung auscultation has no wheezing. She has unilateral crackles on the left side which is most likely caused by mild congestive heart failure exacerbation. We'll continue current regimen.

## 2012-05-18 NOTE — Progress Notes (Signed)
Called in per Dr. Margaretha Sheffield.

## 2012-05-19 ENCOUNTER — Other Ambulatory Visit: Payer: Self-pay | Admitting: *Deleted

## 2012-05-19 ENCOUNTER — Telehealth: Payer: Self-pay | Admitting: Oncology

## 2012-05-19 ENCOUNTER — Ambulatory Visit (HOSPITAL_BASED_OUTPATIENT_CLINIC_OR_DEPARTMENT_OTHER): Payer: Medicare Other | Admitting: Oncology

## 2012-05-19 ENCOUNTER — Ambulatory Visit: Payer: Self-pay

## 2012-05-19 ENCOUNTER — Other Ambulatory Visit (HOSPITAL_BASED_OUTPATIENT_CLINIC_OR_DEPARTMENT_OTHER): Payer: Medicare Other | Admitting: Lab

## 2012-05-19 VITALS — BP 96/51 | HR 60 | Temp 98.6°F | Resp 16 | Wt 289.2 lb

## 2012-05-19 DIAGNOSIS — Z170 Estrogen receptor positive status [ER+]: Secondary | ICD-10-CM

## 2012-05-19 DIAGNOSIS — C50919 Malignant neoplasm of unspecified site of unspecified female breast: Secondary | ICD-10-CM

## 2012-05-19 DIAGNOSIS — J449 Chronic obstructive pulmonary disease, unspecified: Secondary | ICD-10-CM

## 2012-05-19 DIAGNOSIS — D381 Neoplasm of uncertain behavior of trachea, bronchus and lung: Secondary | ICD-10-CM

## 2012-05-19 LAB — CBC WITH DIFFERENTIAL/PLATELET
BASO%: 0.6 % (ref 0.0–2.0)
EOS%: 0.6 % (ref 0.0–7.0)
Eosinophils Absolute: 0 10*3/uL (ref 0.0–0.5)
MCHC: 28.8 g/dL — ABNORMAL LOW (ref 31.5–36.0)
MCV: 67.9 fL — ABNORMAL LOW (ref 79.5–101.0)
MONO%: 11.4 % (ref 0.0–14.0)
NEUT#: 5.2 10*3/uL (ref 1.5–6.5)
RBC: 4.23 10*6/uL (ref 3.70–5.45)
RDW: 24.3 % — ABNORMAL HIGH (ref 11.2–14.5)
nRBC: 1 % — ABNORMAL HIGH (ref 0–0)

## 2012-05-19 LAB — BASIC METABOLIC PANEL WITH GFR
Calcium: 8.6 mg/dL (ref 8.4–10.5)
GFR, Est African American: 48 mL/min — ABNORMAL LOW
Glucose, Bld: 88 mg/dL (ref 70–99)
Sodium: 143 mEq/L (ref 135–145)

## 2012-05-19 NOTE — Progress Notes (Signed)
ID: Melissa Villa   DOB: 09-Apr-1946  MR#: [id number]  WUJ#:[id number]  PCP: [name], MD GYN:  SU:  OTHER MD: [name]   HISTORY OF PRESENT ILLNESS: Melissa Villa has a history of remote bilateral breast cancer, as noted below. More recently, on 01/19/2012, she presented for routine screening mammography, but it was found that her right breast had shrunk considerably, with increased density. It was difficult to pull away the breast from the chest wall for imaging.  Accordingly an ultrasound was obtained this showed a 3.5 cm irregular microlobulated mass parallel to the chest wall. There was no increased Doppler activity. He was difficult to tell whether this was recurrent tumor or progressive scarring. 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 HER-2 was amplified. Her subsequent history is as detailed below  INTERVAL HISTORY: Melissa Villa returns today with one of her daughter-in-law's for followup of her locally advanced right breast cancer. Since the last visit here she had a hospital admission requiring intubation 4 acute respiratory failure. She went through a course of steroids after discharge.  REVIEW OF SYSTEMS: She can't sleep even though she uses her CPAP as best she can. She describes herself as severely fatigued. Her feet swell, and she has a dry cough most of the time. She is using oxygen at 24/7. She has nausea, a runny nose, low back pain, and minimal constipation from the oxycodone she takes for this. She denies fevers, purulent sputum, hemoptysis, or pleurisy. She tells me her sugars have been "okay". A detailed review of systems today was otherwise stable  PAST MEDICAL HISTORY: Past Medical History  Diagnosis Date  . CHF (congestive heart failure)      2-D echo 02/19/2009 showed left ventricle cavity size normal; systolic function  normal, estimated ejection fraction 55%; wall motion normal; no regional wall motion abnormalities; PA peak pressure 47mm Hg.  [name] COPD (chronic obstructive pulmonary disease)     Pulmonary function tests on 08/05/2010 showed mixed obstructive and restrictive lung disease with no significant response to bronchodilator, and decreased diffusion capacity that corrects to normal range when adjusted for the inhaled alveolar volume.  . Degenerative joint disease   . AV block, complete     S/P dual-chamber for symptomatic episodes of bradycardia and complete heart block.  . Pacemaker     S/P dual-chamber for symptomatic episodes of bradycardia and complete heart block.  . Adenomatous polyp of colon     Tubular adenomas X 2 found 06/1999; negative screening colonoscopy 02/13/2004 by Dr. [name].  . Hypercholesteremia   . Obstructive sleep apnea     Baseline diagnostic nocturnal polysomnogram on July 27, 2005 showed severe obstructive sleep apnea/hypopnea syndrome, with AHI 153.2 per hour.  Last nocturnal polysomnogram done for CPAP titration was 05/01/2009; CPAP was titrated to 13 CWP, AHI 1.8 per hour using a large Respironics FitLife Full Face Mask with heated humidifier.  . Peripheral autonomic neuropathy due to diabetes mellitus   . 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 [name]  . Constipation   . Hand pain   . Palpitation   . Rib pain     right sided   . DM type 2 (diabetes mellitus, type 2)   . Skin rash   . Urinary incontinence   . Proliferative diabetic retinopathy(362.02)  S/P panretinal photocoagulation by Dr. [name]  . Vitreous hemorrhage     With tractional detachment, left eye; S/P posterior vitrectomy with membrane peel by Dr. [name] 04/06/2005  . Dyspnea on exertion     Cardiac cath 06/17/2005 by Dr. [name] showed LVEF 50-55%, 30% proximal LAD stenosis, small diagonals.   . Shingles rash 03/25/11  . Breast cancer     S/P  left lumpectomy and axillary node dissection 03/1991 for a T1 N0 medullary breast cancer, no lymph node involvement, treated with tamoxifen for 2 years; S/P right lumpectomy and axillary lymph node dissection August 1995 for a T1 N0 microinvasive breast cancer, treated with Arimidex for 5 years.  Found to have invasive ductal carcinoma of the right breast in 10/ 2011/09/06. Followed by Dr. [name].  . Breast cancer, stage 3 102/13    right hx lumpectomy 1994, bx today=invasive ductal ca  . Allergy     thinitis  . Asthma   . Bronchitis     hx  . Cataract     b/l surgery  . DM hyperosmolarity type II   . Hypertension   . COPD (chronic obstructive pulmonary disease)   . Diastolic CHF   . TIA (transient ischemic attack)   . OSA (obstructive sleep apnea)   . Chronic respiratory failure with hypoxia   . Anemia   . DJD (degenerative joint disease)   . AV block   . DM retinopathy   . DM neuropathies   . Carcinoid tumor of lung     PAST SURGICAL HISTORY: Past Surgical History  Procedure Date  . Mastectomy partial / lumpectomy w/ axillary lymphadenectomy 06-Sep-1990, 1993/09/05    S/P left lumpectomy and axillary lymph node dissection December 1992 for a T1 N0 medullary breast cancer, no lymph node involvement, treated with tamoxifen for 2 years; S/P right lumpectomy and axillary lymph node dissection August 1995 for a T1 N0 microinvasive breast cancer, treated with Arimidex for 5 years.  Patient is followed by Dr. [name] at the cancer center.  . Lung lobectomy 06/13/2001    S/P right lower lobe lobectomy 06/13/2001 by Dr. [name]  . Pacemaker insertion 04/20/2001  . Mastectomy partial / lumpectomy   . Lobectomy   . Pacemaker insertion   . Cholecystectomy   . Tonsillectomy     FAMILY HISTORY Family History  Problem Relation Age of Onset  . Breast cancer Mother 33    Deceased Sep 06, 2002  . Diabetes type II Mother   . Heart attack Brother   . Cervical cancer Neg Hx   . Colon cancer Neg Hx   .  Breast cancer Mother   . Diabetes Mellitus II Mother   . Hypertension Brother   . Coronary artery disease Brother   . Stroke Brother   . Diabetes type II Brother   . Hypertension Sister    the patient has no information regarding her father. Her mother died at the age of 75 with end-stage renal disease. She had been diagnosed with breast cancer in her mid 56s. Melissa Villa had 4 brothers and 3 sisters. One brother died from a drug overdose and one from AIDS. One sister died from EtOH abuse. There is no other history of breast cancer in the family to her knowledge.  GYNECOLOGIC HISTORY: She is GX P3, first live birth age 73. She stopped having periods in the 1990s.  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 She was widowed in 2005-09-05. She  lives by herself. Her 3 children live in [place]. When asked who she would call an emergency, she says "I would have to think about it; we are not close". She has 9 grandchildren, but is only in touch with 3. She attends a [employer].   ADVANCED DIRECTIVES: she intends to name her son Melissa Villa (works for [employer], phone [phone]) as healthcare power of attorney; she received the appropriate forms to complete and notarized today (03/10/2012)  HEALTH MAINTENANCE: History  Substance Use Topics  . Smoking status: Former Smoker -- 1.0 packs/day for 15 years    Types: Cigarettes    Quit date: 04/19/2001  . Smokeless tobacco: Never Used  . Alcohol Use: No     Colonoscopy:  PAP:  Bone density:  Lipid panel:  No Known Allergies  Current Outpatient Prescriptions  Medication Sig Dispense Refill  . ADVAIR DISKUS 250-50 MCG/DOSE AEPB INHALE ONE PUFF INTO THE LUNGS TWICE    DAILY  60 each  5  . AGGRENOX 25-200 MG per 12 hr capsule TAKE ONE CAPSULE TWICE DAILY  62 capsule  5  . albuterol (PROAIR HFA) 108 (90 BASE) MCG/ACT inhaler Inhale 2 puffs into the lungs 4 (four) times daily as needed for wheezing or shortness of breath.  1  Inhaler  11  . albuterol (PROVENTIL) (2.5 MG/3ML) 0.083% nebulizer solution Take 3 mLs (2.5 mg total) by nebulization every 6 (six) hours as needed for wheezing or shortness of breath.  75 mL  12  . amLODipine (NORVASC) 10 MG tablet Take 10 mg by mouth daily before breakfast.      . Bepotastine Besilate (BEPREVE) 1.5 % SOLN Place 1 drop into both eyes 2 (two) times daily.      . budesonide (PULMICORT) 0.5 MG/2ML nebulizer solution Take 2 mLs (0.5 mg total) by nebulization 2 (two) times daily.  2 mL  3  . calcium-vitamin D (OSCAL WITH D) 250-125 MG-UNIT per tablet Take 1 tablet by mouth daily.      . carvedilol (COREG) 12.5 MG tablet Take 2 tablets (25 mg total) by mouth 2 (two) times daily.  120 tablet  6  . cetirizine (ZYRTEC) 10 MG tablet Take 10 mg by mouth daily as needed. For seasonal allergies      . Dexlansoprazole (DEXILANT) 60 MG capsule Take 60 mg by mouth every morning. Take 15 minutes before breakfast.      . DIOVAN 80 MG tablet TAKE 2 TABLETS 2 TIMES A DAY DAY.  120 tablet  5  . furosemide (LASIX) 20 MG tablet TAKE ONE (1) TABLET EACH DAY  30 tablet  2  . insulin glargine (LANTUS SOLOSTAR) 100 UNIT/ML injection Inject 42 Units into the skin daily.  15 mL  12  . lidocaine-prilocaine (EMLA) cream Apply a thumbnail size to port site at least one prior to procedures. Cover with plastic wrap.  30 g  1  . metFORMIN (GLUCOPHAGE) 850 MG tablet Take 850 mg by mouth 3 (three) times daily.      . mometasone (NASONEX) 50 MCG/ACT nasal spray Place 2 sprays into the nose daily. scheduled      . Multiple Vitamin (MULTIVITAMIN WITH MINERALS) TABS Take 1 tablet by mouth daily.      [name] OVER THE COUNTER MEDICATION Take 1 tablet by mouth daily. Otc.Ferrous 45mg       . oxyCODONE (OXY IR/ROXICODONE) 5 MG immediate release tablet Take 1-2 tablets (5-10 mg total) by mouth every 6 (six) hours as needed for  pain.  60 tablet  0  . potassium chloride (K-DUR) 10 MEQ tablet Please do not take this medication until I  call you and tell you how to take it.  20 tablet  0  . pregabalin (LYRICA) 200 MG capsule Take 1 capsule (200 mg total) by mouth 2 (two) times daily.  60 capsule  1  . rosuvastatin (CRESTOR) 5 MG tablet Take 10 mg by mouth daily.      . sitaGLIPtin (JANUVIA) 100 MG tablet Take 100 mg by mouth daily.      [name] tiotropium (SPIRIVA) 18 MCG inhalation capsule Place 1 capsule (18 mcg total) into inhaler and inhale daily.  30 capsule  11  . traMADol (ULTRAM) 50 MG tablet Take 1 tablet (50 mg total) by mouth every 6 (six) hours as needed for pain (cough).  60 tablet  2    OBJECTIVE:   Middle-aged [place] American woman examined in a wheelchair, receiving oxygen through nasal cannula Filed Vitals:   05/19/12 1134  BP: 96/51  Pulse: 60  Temp: 98.6 F (37 Villa)  Resp: 16    Sclerae unicteric Oropharynx clear No cervical or supraclavicular adenopathy Lungs show diffuse wheezes bilaterally Heart regular rate and rhythm Abd obese, soft, positive bowel sounds MSK no focal spinal tenderness Neuro: nonfocal; she is well oriented Breasts: Deferred  LAB RESULTS: Lab Results  Component Value Date   WBC 8.5 05/05/2012   NEUTROABS 5.2 05/05/2012   HGB 7.9* 05/05/2012   HCT 27.3* 05/05/2012   MCV 68.0* 05/05/2012   PLT 232 05/05/2012      Chemistry      Component Value Date/Time   NA 143 05/18/2012 1426   NA 140 05/05/2012 1101   K 5.1 05/18/2012 1426   K 4.6 05/05/2012 1101   CL 109 05/18/2012 1426   CL 105 05/05/2012 1101   CO2 25 05/18/2012 1426   CO2 26 05/05/2012 1101   BUN 22 05/18/2012 1426   BUN 19.0 05/05/2012 1101   CREATININE 1.34* 05/18/2012 1426   CREATININE 1.1 05/05/2012 1101   CREATININE 1.15* 04/06/2012 1045      Component Value Date/Time   CALCIUM 8.6 05/18/2012 1426   CALCIUM 8.5 05/05/2012 1101   ALKPHOS 65 05/05/2012 1101   ALKPHOS 67 03/28/2012 1954   AST 15 05/05/2012 1101   AST 37 03/28/2012 1954   ALT 13 05/05/2012 1101   ALT 16 03/28/2012 1954   BILITOT 0.20 05/05/2012 1101    BILITOT 0.2* 03/28/2012 1954       Lab Results  Component Value Date   LABCA2 48* 05/05/2012    No components found with this basename: [id number]    No results found for this basename: INR:1;PROTIME:1 in the last 168 hours  Urinalysis    Component Value Date/Time   COLORURINE YELLOW 04/06/2012 1201   APPEARANCEUR HAZY* 04/06/2012 1201   LABSPEC 1.023 04/06/2012 1201   PHURINE 5.5 04/06/2012 1201   GLUCOSEU NEGATIVE 04/06/2012 1201   HGBUR NEGATIVE 04/06/2012 1201   BILIRUBINUR NEGATIVE 04/06/2012 1201   KETONESUR NEGATIVE 04/06/2012 1201   PROTEINUR 100* 04/06/2012 1201   UROBILINOGEN 0.2 04/06/2012 1201   NITRITE NEGATIVE 04/06/2012 1201   LEUKOCYTESUR LARGE* 04/06/2012 1201    STUDIES: Ct Head Wo Contrast  02/22/2012  *RADIOLOGY REPORT*  Clinical Data: Headache; subconjunctival right eye hemorrhage. Fell at [facility] 4 days ago.  CT HEAD WITHOUT CONTRAST  Technique:  Contiguous axial images were obtained from the base of the skull through the vertex  without contrast.  Comparison: None.  Findings: There is no evidence of acute infarction, mass lesion, or intra- or extra-axial hemorrhage on CT.  Mild periventricular white matter change may reflect mild small vessel ischemic microangiopathy.  A likely chronic lacunar infarct is noted within the right pons.  The cerebellum and fourth ventricle are within normal limits.  The third and lateral ventricles, and basal ganglia are unremarkable in appearance.  The cerebral hemispheres are symmetric in appearance, with normal gray-white differentiation.  No mass effect or midline shift is seen.  There is no evidence of fracture; scattered small lucencies in the calvarium are of uncertain significance.  Would suggest clinical correlation to exclude multiple myeloma or metastatic disease, given the patient's history.  The visualized portions of the orbits are within normal limits.  The paranasal sinuses and mastoid air cells are well-aerated.   No significant soft tissue abnormalities are seen.  IMPRESSION:  1.  No evidence of traumatic intracranial injury or fracture. 2.  Likely mild small vessel ischemic microangiopathy and chronic lacunar infarct within the right pons. 3.  Scattered small lucencies in the calvarium, of uncertain significance.  Would suggest clinical correlation to exclude multiple myeloma or metastatic disease, given the patient's history.  These could remain within normal limits.   Original Report Authenticated By: Tonia Ghent, M.D.    Dg Chest Port 1 View  02/22/2012  *RADIOLOGY REPORT*  Clinical Data: Follow up CHF, history breast cancer  PORTABLE CHEST - 1 VIEW  Comparison: Portable exam 0642 hours compared to 02/21/2012  Findings: Right costophrenic angle excluded. Right subclavian transvenous pacemaker leads again noted, the distal lead incompletely imaged. Enlargement of cardiac silhouette with pulmonary vascular congestion. Perihilar infiltrates consistent with pulmonary edema. Fluid at minor fissure. Question atelectasis versus consolidation in the right lower lobe Surgical clips left axilla likely reflect prior axillary lymph node dissection. No pneumothorax.  IMPRESSION: Enlargement of cardiac silhouette with pulmonary vascular congestion mild pulmonary edema. Small right pleural effusion with persistent atelectasis versus consolidation right lower lobe.   Original Report Authenticated By: Ulyses Southward, M.D.    Dg Chest Port 1 View  02/21/2012  *RADIOLOGY REPORT*  Clinical Data: Check endotracheal tube  PORTABLE CHEST - 1 VIEW  Comparison: 02/20/2012  Findings: Endotracheal tube in place with tip 3.6 cm above the carina.  Stable NG tube position.  Dual lead cardiac pacemaker is unchanged in position.  Again noted status post left axillary lymph node dissection.  Cardiomediastinal silhouette is stable. Again noted central vascular congestion.  Persistent atelectasis or infiltrate in the right lung.  Small left basilar  atelectasis.  IMPRESSION: Stable support apparatus.  Central vascular congestion.  Again noted atelectasis or infiltrate in the right lung.  Small left basilar atelectasis.   Original Report Authenticated By: Natasha Mead, M.D.    Dg Chest Port 1 View  02/20/2012  *RADIOLOGY REPORT*  Clinical Data: Pulmonary edema.  Endotracheal tube.  PORTABLE CHEST - 1 VIEW  Comparison: 02/19/2012  Findings: Endotracheal tube with tip about 4.2 cm above the carina. Enteric tube remains in place.  Tip is not visualized.  Stable appearance of cardiac pacemaker.  Persistent cardiac enlargement with mild pulmonary vascular congestion.  Infiltration or atelectasis in the right lung base is increasing.  Probable small bilateral pleural effusions.  No pneumothorax.  IMPRESSION: Cardiac enlargement with pulmonary vascular congestion are stable. Increasing infiltration or atelectasis in the right lung base. Probable small bilateral pleural effusions.   Original Report Authenticated By: Burman Nieves, M.D.  Dg Chest Port 1 View  02/19/2012  *RADIOLOGY REPORT*  Clinical Data: Respiratory distress, ventilatory support  PORTABLE CHEST - 1 VIEW  Comparison: 02/18/2012  Findings: Endotracheal tube 3.2 cm above the carina.  NG tube enters the stomach with the tip not visualized.  Right subclavian pacer noted.  Stable cardiac enlargement with diffuse interstitial edema pattern. Right effusion persists with persistent right base atelectasis / consolidation.  No interval change.  No pneumothorax.  IMPRESSION: Stable CHF pattern with a persistent right effusion and associated right base atelectasis / consolidation.   Original Report Authenticated By: Judie Petit. Shick, M.D.    Dg Chest Portable 1 View  02/18/2012  *RADIOLOGY REPORT*  Clinical Data: Respiratory distress.  PORTABLE CHEST - 1 VIEW  Comparison: Chest x-ray 02/18/2012.  Findings: An endotracheal tube is in place with tip 2.7 cm above the carina. A nasogastric tube is seen extending into  the stomach, however, the tip of the nasogastric tube extends below the lower margin of the image.  Lung volumes are low.  Bibasilar opacities (right greater than left) may reflect areas of atelectasis and/or consolidation.  Small right pleural effusion. There is cephalization of the pulmonary vasculature and slight indistinctness of the interstitial markings suggestive of mild pulmonary edema.  The patient is severely rotated to the right which causes gross distortion of cardiomediastinal contours.  Heart size does appear enlarged.  Atherosclerosis in the thoracic aorta. Right-sided pacemaker device in place with lead tips projecting over the expected location of the right atrium and right ventricular apex.  Surgical clips project over the axillary regions bilaterally.  IMPRESSION: 1.  Support apparatus and postoperative changes, as above. 2.  Findings suggest congestive heart failure, as above. 3.  Persistent bibasilar atelectasis and/or consolidation with superimposed small right pleural effusion. 4.  Atherosclerosis.   Original Report Authenticated By: Trudie Reed, M.D.    Dg Chest Portable 1 View  02/18/2012  *RADIOLOGY REPORT*  Clinical Data: Respiratory distress  PORTABLE CHEST - 1 VIEW  Comparison: None  Findings: The endotracheal tube tip is in satisfactory position above the carina.  The patient has a right chest wall pacer device with lead in the right atrial appendage and right ventricle.  The heart size is enlarged.  There is a right pleural effusion. Moderate interstitial edema is present. Chronic-appearing right posterior lateral rib fracture is noted.  There are surgical clips identified within the axillary regions.  IMPRESSION:  1.  Suspect congestive heart failure. 2.  Endotracheal tube tip in satisfactory position above the carina.   Original Report Authenticated By: Signa Kell, M.D.    Ct Chest W Contrast  01/27/2012  *RADIOLOGY REPORT*  Clinical Data: Recently diagnosed right  breast carcinoma.  Previous breast and lung carcinoma.  Previous surgery and radiation therapy. COPD.  CT CHEST WITH CONTRAST  Technique:  Multidetector CT imaging of the chest was performed following the standard protocol during bolus administration of intravenous contrast.  Contrast: 80mL OMNIPAQUE IOHEXOL 300 MG/ML  SOLN  Comparison: None.  Findings: There is asymmetric soft tissue density seen in the right breast, consistent with the patient's recent diagnosis of right breast carcinoma.  No deep chest wall invasion demonstrated.  No evidence of axillary lymphadenopathy.  Surgical clips are seen in both axillary regions.  Transvenous pacemaker is also seen in the right anterior chest wall.  Mild mediastinal lymphadenopathy is seen in the prevascular and lateral aortic regions, the aorta pulmonary window, right paratracheal region and subcarinal region.  The largest confluent area  of adenopathy in the right paratracheal region measures 2.3 x 3.0 cm.  There is mild bilateral hilar lymphadenopathy.  Chronic postsurgical changes are seen from previous right lower lobectomy.  No suspicious pulmonary nodules or masses are identified.  There is no evidence of pulmonary infiltrate or central endobronchial lesion.  No evidence of pleural or pericardial effusion.  A small right adrenal mass is seen measuring 1.4 x 2.3 cm.  This has attenuation value of 28 HU on this contrast-enhanced study, which is indeterminate.  No other mass or adenopathy seen with the visualized portion of upper abdomen.  No suspicious bone lesions are identified.  IMPRESSION:  1.  Mild mediastinal and bilateral hilar lymphadenopathy, consistent with metastatic disease. 2.  No evidence of axillary lymphadenopathy or pulmonary metastases. 3.  2.3 cm indeterminate right adrenal mass.  PET CT scan or abdomen MRI is recommended for further evaluation.  This recommendation follows ACR consensus guidelines:  Managing Incidental Findings on Abdominal CT:   White Paper of the ACR Incidental Findings Committee.  J Am Coll Radiol 1610;9:604-540   Original Report Authenticated By: Danae Orleans, M.D.     ASSESSMENT: 67 y.o. [place] woman status post -   (1) Left lumpectomy and axillary lymph node dissection December 1992 for a T1N0 medullary breast cancer treated with tamoxifen for 2 years.  (2) Status post right lumpectomy and axillary lymph node dissection August 1995 for a T1N0 microinvasive breast cancer treated with Arimidex for 5 years.  (3) Adjuvantly she was treated with right breast tangents to a dose of 5040 cGy in that her tumor bed was boosted for a further 1000 cGy in 5 sessions, completing all therapy in 02/12/1994. [Of note she had a right axillary dissection and all 20 lymph nodes were negative for metastatic disease].   (4) Status post right lower lobe lobectomy February 2003 for a 5 mm lung carcinoid.  (5) Right breast biopsy 01/19/2012 shows an invasive ductal carcinoma, grade 2, estrogen and progesterone receptor positive, with an elevated MIB-1, HER-2 amplified with a ratio of 2.29 by [facility].   (6) Clinically the tumor extends over most of the breast and into the surrounding chest wall skin. Radiation oncology feels at most 20 gray additional radiation could be given to this area, which is unlikely to be of benefit, and therefore this is not contemplated.   (7) Extensive staging studies including bone scan, and CT scans of the head, chest, abdomen, and pelvis, show no definite areas of distant disease (there are some mediastinal and bilateral hilar lymph nodes and some right adrenal range is of uncertain significance).  (6) fulvestrant started 01/28/2012  (7) trastuzumab  started 04/07/2012; echo 02/20/2012 shows an EF of 55%   PLAN: Saryah does not look well. She could easily have died during her most recent admission. I think breast cancer is less important that her multiple other comorbid conditions, [name] don't think she  would be able to tolerate congestive heart failure from Herceptin if that were to develop.  Accordingly we are stopping the Herceptin at this point. She is doing well with the fulvestrant, and we will continue that medication, which should control her breast cancer and perhaps even get [place] a measurable disease response. She will receive her next treatment in 2 weeks, and then when we see her in mid March we will do a preliminary assessment of clinical response (through breast exam).  Today we discussed her home situation, and it is questionable how much longer she  will be able to stay home before requiring 24/7 care. I went ahead and wrote her a prescription for a wheelchair, since she is concerned, I think appropriately, about falling down while staying home alone, and not being able to get up. I also wrote her for a bedside commode. She knows to call for any problems that may develop before the next visit.  Melissa Villa    05/19/2012

## 2012-05-24 ENCOUNTER — Ambulatory Visit (HOSPITAL_COMMUNITY)
Admission: RE | Admit: 2012-05-24 | Discharge: 2012-05-24 | Disposition: A | Discharge status: Home / Self Care | Payer: Medicare Other | Source: Ambulatory Visit | Attending: Internal Medicine | Admitting: Internal Medicine

## 2012-05-24 ENCOUNTER — Ambulatory Visit (INDEPENDENT_AMBULATORY_CARE_PROVIDER_SITE_OTHER): Payer: Medicare Other | Admitting: Internal Medicine

## 2012-05-24 ENCOUNTER — Inpatient Hospital Stay (HOSPITAL_COMMUNITY)
Admission: AD | Admit: 2012-05-24 | Discharge: 2012-06-06 | DRG: 208 | Disposition: A | Discharge status: Home Health | Payer: Medicare Other | Source: Ambulatory Visit | Attending: Internal Medicine | Admitting: Internal Medicine

## 2012-05-24 ENCOUNTER — Encounter: Payer: Self-pay | Admitting: Internal Medicine

## 2012-05-24 ENCOUNTER — Encounter (HOSPITAL_COMMUNITY): Payer: Self-pay | Admitting: General Practice

## 2012-05-24 VITALS — BP 120/64 | HR 64 | Temp 97.8°F | Ht 64.0 in

## 2012-05-24 DIAGNOSIS — M549 Dorsalgia, unspecified: Secondary | ICD-10-CM

## 2012-05-24 DIAGNOSIS — E7800 Pure hypercholesterolemia, unspecified: Secondary | ICD-10-CM

## 2012-05-24 DIAGNOSIS — I279 Pulmonary heart disease, unspecified: Secondary | ICD-10-CM | POA: Diagnosis present

## 2012-05-24 DIAGNOSIS — R0609 Other forms of dyspnea: Secondary | ICD-10-CM

## 2012-05-24 DIAGNOSIS — J4489 Other specified chronic obstructive pulmonary disease: Secondary | ICD-10-CM

## 2012-05-24 DIAGNOSIS — Z860100 Personal history of colon polyps, unspecified: Secondary | ICD-10-CM

## 2012-05-24 DIAGNOSIS — R059 Cough, unspecified: Secondary | ICD-10-CM | POA: Diagnosis present

## 2012-05-24 DIAGNOSIS — G4733 Obstructive sleep apnea (adult) (pediatric): Secondary | ICD-10-CM | POA: Diagnosis present

## 2012-05-24 DIAGNOSIS — R05 Cough: Secondary | ICD-10-CM | POA: Diagnosis present

## 2012-05-24 DIAGNOSIS — E876 Hypokalemia: Secondary | ICD-10-CM | POA: Diagnosis not present

## 2012-05-24 DIAGNOSIS — Z950 Presence of cardiac pacemaker: Secondary | ICD-10-CM

## 2012-05-24 DIAGNOSIS — I5032 Chronic diastolic (congestive) heart failure: Secondary | ICD-10-CM | POA: Diagnosis present

## 2012-05-24 DIAGNOSIS — R0603 Acute respiratory distress: Secondary | ICD-10-CM | POA: Diagnosis present

## 2012-05-24 DIAGNOSIS — G909 Disorder of the autonomic nervous system, unspecified: Secondary | ICD-10-CM | POA: Diagnosis present

## 2012-05-24 DIAGNOSIS — K219 Gastro-esophageal reflux disease without esophagitis: Secondary | ICD-10-CM | POA: Diagnosis present

## 2012-05-24 DIAGNOSIS — I10 Essential (primary) hypertension: Secondary | ICD-10-CM | POA: Diagnosis present

## 2012-05-24 DIAGNOSIS — E11359 Type 2 diabetes mellitus with proliferative diabetic retinopathy without macular edema: Secondary | ICD-10-CM | POA: Diagnosis present

## 2012-05-24 DIAGNOSIS — E1139 Type 2 diabetes mellitus with other diabetic ophthalmic complication: Secondary | ICD-10-CM | POA: Diagnosis present

## 2012-05-24 DIAGNOSIS — J189 Pneumonia, unspecified organism: Secondary | ICD-10-CM

## 2012-05-24 DIAGNOSIS — I82401 Acute embolism and thrombosis of unspecified deep veins of right lower extremity: Secondary | ICD-10-CM

## 2012-05-24 DIAGNOSIS — Z87891 Personal history of nicotine dependence: Secondary | ICD-10-CM

## 2012-05-24 DIAGNOSIS — D509 Iron deficiency anemia, unspecified: Secondary | ICD-10-CM

## 2012-05-24 DIAGNOSIS — C50919 Malignant neoplasm of unspecified site of unspecified female breast: Secondary | ICD-10-CM

## 2012-05-24 DIAGNOSIS — N189 Chronic kidney disease, unspecified: Secondary | ICD-10-CM | POA: Diagnosis present

## 2012-05-24 DIAGNOSIS — J810 Acute pulmonary edema: Secondary | ICD-10-CM

## 2012-05-24 DIAGNOSIS — Z853 Personal history of malignant neoplasm of breast: Secondary | ICD-10-CM

## 2012-05-24 DIAGNOSIS — R0602 Shortness of breath: Secondary | ICD-10-CM

## 2012-05-24 DIAGNOSIS — J9620 Acute and chronic respiratory failure, unspecified whether with hypoxia or hypercapnia: Secondary | ICD-10-CM

## 2012-05-24 DIAGNOSIS — M1990 Unspecified osteoarthritis, unspecified site: Secondary | ICD-10-CM | POA: Diagnosis present

## 2012-05-24 DIAGNOSIS — I2510 Atherosclerotic heart disease of native coronary artery without angina pectoris: Secondary | ICD-10-CM | POA: Diagnosis present

## 2012-05-24 DIAGNOSIS — J449 Chronic obstructive pulmonary disease, unspecified: Secondary | ICD-10-CM | POA: Diagnosis present

## 2012-05-24 DIAGNOSIS — N179 Acute kidney failure, unspecified: Secondary | ICD-10-CM | POA: Diagnosis present

## 2012-05-24 DIAGNOSIS — G9340 Encephalopathy, unspecified: Secondary | ICD-10-CM

## 2012-05-24 DIAGNOSIS — I129 Hypertensive chronic kidney disease with stage 1 through stage 4 chronic kidney disease, or unspecified chronic kidney disease: Secondary | ICD-10-CM | POA: Diagnosis present

## 2012-05-24 DIAGNOSIS — R0989 Other specified symptoms and signs involving the circulatory and respiratory systems: Secondary | ICD-10-CM

## 2012-05-24 DIAGNOSIS — E119 Type 2 diabetes mellitus without complications: Secondary | ICD-10-CM

## 2012-05-24 DIAGNOSIS — A0472 Enterocolitis due to Clostridium difficile, not specified as recurrent: Secondary | ICD-10-CM | POA: Diagnosis not present

## 2012-05-24 DIAGNOSIS — J90 Pleural effusion, not elsewhere classified: Principal | ICD-10-CM

## 2012-05-24 DIAGNOSIS — E1149 Type 2 diabetes mellitus with other diabetic neurological complication: Secondary | ICD-10-CM | POA: Diagnosis present

## 2012-05-24 DIAGNOSIS — K5900 Constipation, unspecified: Secondary | ICD-10-CM | POA: Diagnosis present

## 2012-05-24 DIAGNOSIS — R52 Pain, unspecified: Secondary | ICD-10-CM

## 2012-05-24 DIAGNOSIS — I509 Heart failure, unspecified: Secondary | ICD-10-CM

## 2012-05-24 DIAGNOSIS — E875 Hyperkalemia: Secondary | ICD-10-CM | POA: Diagnosis not present

## 2012-05-24 DIAGNOSIS — Z79899 Other long term (current) drug therapy: Secondary | ICD-10-CM

## 2012-05-24 DIAGNOSIS — D696 Thrombocytopenia, unspecified: Secondary | ICD-10-CM | POA: Diagnosis not present

## 2012-05-24 DIAGNOSIS — I442 Atrioventricular block, complete: Secondary | ICD-10-CM

## 2012-05-24 DIAGNOSIS — J811 Chronic pulmonary edema: Secondary | ICD-10-CM

## 2012-05-24 DIAGNOSIS — Z9981 Dependence on supplemental oxygen: Secondary | ICD-10-CM

## 2012-05-24 DIAGNOSIS — N289 Disorder of kidney and ureter, unspecified: Secondary | ICD-10-CM

## 2012-05-24 HISTORY — DX: Disorder of kidney and ureter, unspecified: N28.9

## 2012-05-24 LAB — COMPLETE METABOLIC PANEL WITH GFR
AST: 19 U/L (ref 0–37)
Alkaline Phosphatase: 51 U/L (ref 39–117)
BUN: 25 mg/dL — ABNORMAL HIGH (ref 6–23)
Creat: 1.62 mg/dL — ABNORMAL HIGH (ref 0.50–1.10)
Potassium: 5.5 mEq/L — ABNORMAL HIGH (ref 3.5–5.3)
Total Bilirubin: 0.1 mg/dL — ABNORMAL LOW (ref 0.3–1.2)

## 2012-05-24 LAB — FERRITIN: Ferritin: 13 ng/mL (ref 10–291)

## 2012-05-24 LAB — CBC WITH DIFFERENTIAL/PLATELET
Basophils Relative: 0 % (ref 0–1)
Eosinophils Absolute: 0.1 10*3/uL (ref 0.0–0.7)
MCH: 19.2 pg — ABNORMAL LOW (ref 26.0–34.0)
MCHC: 27.2 g/dL — ABNORMAL LOW (ref 30.0–36.0)
Neutrophils Relative %: 74 % (ref 43–77)
Platelets: 211 10*3/uL (ref 150–400)

## 2012-05-24 LAB — LIPID PANEL
Cholesterol: 90 mg/dL (ref 0–200)
HDL: 21 mg/dL — ABNORMAL LOW (ref >39)
Total CHOL/HDL Ratio: 4.3 Ratio
Triglycerides: 104 mg/dL (ref <150)
VLDL: 21 mg/dL (ref 0–40)

## 2012-05-24 LAB — POCT GLYCOSYLATED HEMOGLOBIN (HGB A1C): Hemoglobin A1C: 7

## 2012-05-24 LAB — GLUCOSE, CAPILLARY: Glucose-Capillary: 124 mg/dL — ABNORMAL HIGH (ref 70–99)

## 2012-05-24 MED ORDER — CARVEDILOL 25 MG PO TABS
25.0000 mg | ORAL_TABLET | Freq: Two times a day (BID) | ORAL | Status: DC
  Administered 2012-05-24 – 2012-05-25 (×2): 25 mg via ORAL
  Filled 2012-05-24 (×3): qty 1

## 2012-05-24 MED ORDER — FUROSEMIDE 10 MG/ML IJ SOLN
40.0000 mg | Freq: Once | INTRAMUSCULAR | Status: AC
Start: 2012-05-24 — End: 2012-05-25
  Administered 2012-05-25: 40 mg via INTRAVENOUS
  Filled 2012-05-24 (×2): qty 4

## 2012-05-24 MED ORDER — ONDANSETRON HCL 4 MG/2ML IJ SOLN: 4.0000 mg | Freq: Four times a day (QID) | INTRAMUSCULAR | Status: DC | PRN

## 2012-05-24 MED ORDER — ACETAMINOPHEN 325 MG PO TABS: 650.0000 mg | ORAL_TABLET | Freq: Four times a day (QID) | ORAL | Status: DC | PRN

## 2012-05-24 MED ORDER — SODIUM CHLORIDE 0.9 % IJ SOLN: 3.0000 mL | INTRAMUSCULAR | Status: DC | PRN

## 2012-05-24 MED ORDER — ALBUTEROL SULFATE HFA 108 (90 BASE) MCG/ACT IN AERS: 2.0000 | INHALATION_SPRAY | Freq: Four times a day (QID) | RESPIRATORY_TRACT | Status: DC | PRN

## 2012-05-24 MED ORDER — SODIUM CHLORIDE 0.9 % IJ SOLN
3.0000 mL | Freq: Two times a day (BID) | INTRAMUSCULAR | Status: DC
  Administered 2012-05-25 (×2): 3 mL via INTRAVENOUS

## 2012-05-24 MED ORDER — INSULIN GLARGINE 100 UNIT/ML ~~LOC~~ SOLN
20.0000 [IU] | Freq: Two times a day (BID) | SUBCUTANEOUS | Status: DC
  Administered 2012-05-24 – 2012-05-25 (×2): 20 [IU] via SUBCUTANEOUS

## 2012-05-24 MED ORDER — ENOXAPARIN SODIUM 40 MG/0.4ML ~~LOC~~ SOLN: 40.0000 mg | SUBCUTANEOUS | Status: DC

## 2012-05-24 MED ORDER — ASPIRIN-DIPYRIDAMOLE ER 25-200 MG PO CP12
1.0000 | ORAL_CAPSULE | Freq: Two times a day (BID) | ORAL | Status: DC
  Administered 2012-05-24 – 2012-05-26 (×4): 1 via ORAL
  Filled 2012-05-24 (×5): qty 1

## 2012-05-24 MED ORDER — LINAGLIPTIN 5 MG PO TABS
5.0000 mg | ORAL_TABLET | Freq: Every day | ORAL | Status: DC
  Administered 2012-05-24 – 2012-05-25 (×2): 5 mg via ORAL
  Filled 2012-05-24 (×2): qty 1

## 2012-05-24 MED ORDER — LORATADINE 10 MG PO TABS
10.0000 mg | ORAL_TABLET | Freq: Every day | ORAL | Status: DC
  Administered 2012-05-24 – 2012-05-25 (×2): 10 mg via ORAL
  Filled 2012-05-24 (×2): qty 1

## 2012-05-24 MED ORDER — ADULT MULTIVITAMIN W/MINERALS CH
1.0000 | ORAL_TABLET | Freq: Every day | ORAL | Status: DC
  Administered 2012-05-24 – 2012-05-25 (×2): 1 via ORAL
  Filled 2012-05-24 (×2): qty 1

## 2012-05-24 MED ORDER — LIDOCAINE-PRILOCAINE 2.5-2.5 % EX CREA
TOPICAL_CREAM | Freq: Once | CUTANEOUS | Status: DC
  Filled 2012-05-24: qty 5

## 2012-05-24 MED ORDER — ENOXAPARIN SODIUM 80 MG/0.8ML ~~LOC~~ SOLN
0.5000 mg/kg | SUBCUTANEOUS | Status: DC
  Administered 2012-05-24 – 2012-05-25 (×2): 65 mg via SUBCUTANEOUS
  Filled 2012-05-24 (×3): qty 0.8

## 2012-05-24 MED ORDER — PREGABALIN 50 MG PO CAPS
200.0000 mg | ORAL_CAPSULE | Freq: Two times a day (BID) | ORAL | Status: DC
  Administered 2012-05-24 – 2012-05-25 (×2): 200 mg via ORAL
  Filled 2012-05-24 (×2): qty 2

## 2012-05-24 MED ORDER — OXYCODONE HCL 5 MG PO TABS: 5.0000 mg | ORAL_TABLET | Freq: Four times a day (QID) | ORAL | Status: DC | PRN

## 2012-05-24 MED ORDER — PANTOPRAZOLE SODIUM 40 MG PO TBEC
40.0000 mg | DELAYED_RELEASE_TABLET | Freq: Every day | ORAL | Status: DC
  Administered 2012-05-24 – 2012-05-25 (×2): 40 mg via ORAL
  Filled 2012-05-24 (×2): qty 1

## 2012-05-24 MED ORDER — ATORVASTATIN CALCIUM 10 MG PO TABS
10.0000 mg | ORAL_TABLET | Freq: Every day | ORAL | Status: DC
  Administered 2012-05-24: 10 mg via ORAL
  Filled 2012-05-24 (×2): qty 1

## 2012-05-24 MED ORDER — ALBUTEROL SULFATE (5 MG/ML) 0.5% IN NEBU
2.5000 mg | INHALATION_SOLUTION | Freq: Four times a day (QID) | RESPIRATORY_TRACT | Status: DC | PRN
  Administered 2012-05-24 – 2012-05-25 (×2): 2.5 mg via RESPIRATORY_TRACT
  Filled 2012-05-24 (×2): qty 0.5

## 2012-05-24 MED ORDER — METFORMIN HCL 850 MG PO TABS: 850.0000 mg | ORAL_TABLET | Freq: Three times a day (TID) | ORAL | Status: DC

## 2012-05-24 MED ORDER — IRBESARTAN 150 MG PO TABS
150.0000 mg | ORAL_TABLET | Freq: Two times a day (BID) | ORAL | Status: DC
  Administered 2012-05-24 – 2012-05-25 (×2): 150 mg via ORAL
  Filled 2012-05-24 (×3): qty 1

## 2012-05-24 MED ORDER — SODIUM CHLORIDE 0.9 % IV SOLN
250.0000 mL | INTRAVENOUS | Status: DC | PRN
  Administered 2012-05-30: 250 mL via INTRAVENOUS

## 2012-05-24 MED ORDER — FLUTICASONE PROPIONATE 50 MCG/ACT NA SUSP
2.0000 | Freq: Every day | NASAL | Status: DC
  Administered 2012-05-24 – 2012-05-25 (×2): 2 via NASAL
  Filled 2012-05-24: qty 16

## 2012-05-24 MED ORDER — TIOTROPIUM BROMIDE MONOHYDRATE 18 MCG IN CAPS
18.0000 ug | ORAL_CAPSULE | Freq: Every day | RESPIRATORY_TRACT | Status: DC
  Administered 2012-05-25: 18 ug via RESPIRATORY_TRACT
  Filled 2012-05-24: qty 5

## 2012-05-24 MED ORDER — CALCIUM CARBONATE-VITAMIN D 250-125 MG-UNIT PO TABS: 1.0000 | ORAL_TABLET | Freq: Every day | ORAL | Status: DC

## 2012-05-24 MED ORDER — AMLODIPINE BESYLATE 10 MG PO TABS
10.0000 mg | ORAL_TABLET | Freq: Every day | ORAL | Status: DC
  Administered 2012-05-25: 10 mg via ORAL
  Filled 2012-05-24 (×2): qty 1

## 2012-05-24 MED ORDER — MOMETASONE FURO-FORMOTEROL FUM 100-5 MCG/ACT IN AERO
2.0000 | INHALATION_SPRAY | Freq: Two times a day (BID) | RESPIRATORY_TRACT | Status: DC
  Administered 2012-05-24 – 2012-05-25 (×2): 2 via RESPIRATORY_TRACT
  Filled 2012-05-24: qty 8.8

## 2012-05-24 MED ORDER — CALCIUM CARBONATE-VITAMIN D 500-200 MG-UNIT PO TABS
0.5000 | ORAL_TABLET | Freq: Every day | ORAL | Status: DC
  Administered 2012-05-24 – 2012-05-30 (×7): 0.5 via ORAL
  Filled 2012-05-24 (×8): qty 0.5

## 2012-05-24 MED ORDER — ONDANSETRON HCL 4 MG PO TABS: 4.0000 mg | ORAL_TABLET | Freq: Four times a day (QID) | ORAL | Status: DC | PRN

## 2012-05-24 MED ORDER — BUDESONIDE 0.5 MG/2ML IN SUSP
0.5000 mg | Freq: Two times a day (BID) | RESPIRATORY_TRACT | Status: DC
  Administered 2012-05-24 – 2012-06-06 (×22): 0.5 mg via RESPIRATORY_TRACT
  Filled 2012-05-24 (×29): qty 2

## 2012-05-24 MED ORDER — INSULIN ASPART 100 UNIT/ML ~~LOC~~ SOLN: 0.0000 [IU] | Freq: Every day | SUBCUTANEOUS | Status: DC

## 2012-05-24 MED ORDER — TRAMADOL HCL 50 MG PO TABS
50.0000 mg | ORAL_TABLET | Freq: Four times a day (QID) | ORAL | Status: DC | PRN
  Administered 2012-05-24: 50 mg via ORAL
  Filled 2012-05-24: qty 1

## 2012-05-24 MED ORDER — ACETAMINOPHEN 650 MG RE SUPP: 650.0000 mg | Freq: Four times a day (QID) | RECTAL | Status: DC | PRN

## 2012-05-24 MED ORDER — INSULIN ASPART 100 UNIT/ML ~~LOC~~ SOLN
0.0000 [IU] | Freq: Three times a day (TID) | SUBCUTANEOUS | Status: DC
  Administered 2012-05-25: 2 [IU] via SUBCUTANEOUS

## 2012-05-24 MED ORDER — BEPOTASTINE BESILATE 1.5 % OP SOLN: 1.0000 [drp] | Freq: Two times a day (BID) | OPHTHALMIC | Status: DC

## 2012-05-24 NOTE — Progress Notes (Addendum)
Patient is due for Lasix. BP is 99/80 and HR is 58. MD notified. New orders were given. Ok to hold Lasix. Reported off to receiving RN to re-check BP later and if BP has bumped up, then ok to give. Will continue to monitor patient for further changes in condition.

## 2012-05-24 NOTE — Assessment & Plan Note (Signed)
BP Readings from Last 3 Encounters:  05/24/12 120/64  05/19/12 96/51  05/18/12 100/64    Lab Results  Component Value Date   NA 141 05/24/2012   K 5.5* 05/24/2012   CREATININE 1.62* 05/24/2012    Assessment:  Blood pressure control: controlled  Progress toward BP goal:  at goal  Comments: blood pressure is currently controlled on amlodipine 10 mg daily, carvedilol 25 mg twice a day, Diovan 80 mg twice a day, and furosemide 40 mg daily  Plan:  Medications:  patient is being hospitalized for management of other issues; her regimen will depend on management and progress as inpatient  Educational resources provided: brochure  Self management tools provided: home blood pressure logbook

## 2012-05-24 NOTE — Assessment & Plan Note (Addendum)
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  She is status post right lower lobectomy for pulmonary carcinoid.  As noted above, she also now has chest x-ray evidence of pulmonary edema and a right pleural effusion.  Plan: See problem #1 above.

## 2012-05-24 NOTE — Assessment & Plan Note (Signed)
Lab Results  Component Value Date   CREATININE 1.62* 05/24/2012   CREATININE 1.34* 05/18/2012   CREATININE 1.1 05/05/2012    Assessment: Patient has acute renal insufficiency, which may partially be due to intravascular volume depletion from increased diuretic dose.  Plan: Admit for inpatient management.

## 2012-05-24 NOTE — Assessment & Plan Note (Signed)
Assessment: Patient presents for followup of pulmonary edema and dyspnea; she reports marked generalized fatigability with persisting exertional shortness of breath.  At rest when she presented to clinic today her oxygen saturation on 2 L per minute nasal cannula was in the low 80s; with 3 L it improved to 90-92%.  Chest x-ray today shows congestive heart failure and a moderate right pleural effusion.  It is unclear whether the pleural effusion represents cardiogenic edema or another process.  Plan: Admit to the internal medicine inpatient service for evaluation and treatment.

## 2012-05-24 NOTE — Assessment & Plan Note (Signed)
Lab Results  Component Value Date   HGBA1C 7.0 05/24/2012   HGBA1C 7.0* 01/24/2012   HGBA1C 7.4 11/24/2011     Assessment:  Diabetes control: good control (HgbA1C at goal)  Progress toward A1C goal:  at goal  Comments: A1c is at goal on current regimen of Lantus insulin 42 units daily and metformin 850 mg 3 times a day.    Plan:  Medications:  patient is being admitted for inpatient management of other problems; metformin will need to be held pending improvement of her renal function  Home glucose monitoring:   Frequency: 3 times a day   Timing: before meals  Educational resources provided: brochure  Self management tools provided: home glucose logbook  Other plans: as above, admit for inpatient management of other problems

## 2012-05-24 NOTE — Progress Notes (Signed)
  Subjective:    Patient ID: Melissa Villa, female    DOB: 10-Jan-1946, 67 y.o.   MRN: [id number]  HPI Patient returns for followup of her pulmonary edema, exertional dyspnea, diabetes, hypertension, and other chronic medical problems.  Her main complaint is marked fatigability and exertional dyspnea.  She has been using supplemental oxygen 2 L per minute nasal cannula at rest and 3 L per minute with ambulation at home.  She has been taking furosemide 40 mg daily since her last visit here.   Review of Systems  Constitutional: Positive for fatigue. Negative for fever and chills.  Respiratory: Positive for shortness of breath.   Cardiovascular: Positive for chest pain and leg swelling (better since increasing furosemide dose).  Gastrointestinal: Negative for nausea, vomiting and abdominal pain.  Genitourinary: Negative for dysuria and frequency.  Musculoskeletal: Positive for back pain (lower back pain).  Neurological: Negative for syncope (not since last hospitalization).       Objective:   Physical Exam  Constitutional: No distress.  Cardiovascular: Normal rate, regular rhythm, S1 normal and S2 normal.  Exam reveals no gallop, no S3, no S4 and no friction rub.   Murmur heard.  Systolic murmur is present with a grade of 1/6       Trace bilateral leg edema  Abdominal: Soft. Bowel sounds are normal. She exhibits no distension. There is no tenderness. There is no rebound and no guarding.        Assessment & Plan:

## 2012-05-24 NOTE — H&P (Signed)
[facility] Note Date: 05/24/2012  Patient name: Melissa Villa Medical record number: [id number] Date of birth: 03-07-1946 Age: 67 y.o. Gender: female PCP: [name], MD  Medical Service: Internal Medicine Teaching Service - Cecille Rubin  Attending physician:     Dr. 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  She describes that her sx began to worsen around November 2013, including increased orthopnea, DOE, increased peripheral edema, back pain and leg weakness.  She is unable to get up from bed at times without assistance.  She uses 4 pillows at night, increased from 3 pillow baseline.  Her oxygen requirement currently is 2L [place] at rest, 3L [place] with ambulation and CPAP with 2L O2 at night.  She denies CP, PND, fevers, chills, or sick contacts.  Her son denies changes in mental status.  She was hospitalized from 02/18/12-02/25/12 for respiratory failure requiring intubation from AECOPD and acute diastolic heart failure. She was most recently hospitalized from 03/28/12-04/05/12 with ventilation dependent respiratory failure, MRSA pneumonia, and was found to have a loculated R pleural effusion at that time.  She was discharged in stable condition, but was found to have SOB and nonproductive cough at the HFU appt.  She was seen  in clinic multiple times during January 2013 for increasing SOB and nonproductive cough.  Initially, she was seen by Dr. Maple Hudson on 05/16/12, who increased her PO Lasix to 40mg  daily after ordering a CXR showing BL pulmonary edema.  The patient denies that this improved her sx and that it only sometimes helps increase UOP.  She was seen today, noted to have increased sx and oxygen requirement unresponsive to PO Lasix 40 daily regimen, uptrending creatinine, and a CXR with a persistant moderate sized loculated pleural effusion.  It was felt that these things she would benefit from admission.  She most recently saw her oncologist, Dr. [name], on 05/19/12 regarding her most recent dx of invasive ductal carcinoma, grade 2, ER/PR+, of the R breast.  It is felt that she is not a radiation candidate, as it would likely not be of benefit.  Extensive staging studies, including bone scan and CT scans of head, chest, abdomen and pelvis, show no definitive evidence of distant disease.  There are some mediastinal and BL hilar LNs and some R adrenal range that is of uncertain significance.  Fulvestrant was started on 01/28/12 and Herceptin was started on 04/07/12 (EF 55% on 02/20/12).  Per Dr. Darrall Dears note, they have decided to d/c the Herceptin due to increased risk of HF exacerbation.  Her next treatment of fulvestrant would be in 2 wks from 05/19/12 appt with f/u with Dr. [name] in mid March.  Meds: Current Outpatient Rx  Name  Route  Sig  Dispense  Refill  . ADVAIR DISKUS 250-50 MCG/DOSE IN AEPB      INHALE ONE PUFF INTO  THE LUNGS TWICE    DAILY   60 each   5     PLEASE RESPOND   . AGGRENOX 25-200 MG PO CP12      TAKE ONE CAPSULE TWICE DAILY   62 capsule   5     PLEASE RESPOND   . ALBUTEROL SULFATE HFA 108 (90 BASE) MCG/ACT IN AERS   Inhalation   Inhale 2 puffs into the lungs 4 (four) times daily as needed for wheezing or shortness of breath.   1 Inhaler   11   . ALBUTEROL SULFATE (2.5 MG/3ML)  0.083% IN NEBU   Nebulization   Take 3 mLs (2.5 mg total) by nebulization every 6 (six) hours as needed for wheezing or shortness of breath.   75 mL   12   . AMLODIPINE BESYLATE 10 MG PO TABS   Oral   Take 10 mg by mouth daily before breakfast.         . BEPOTASTINE BESILATE 1.5 % OP SOLN   Both Eyes   Place 1 drop into both eyes 2 (two) times daily.         . BUDESONIDE 0.5 MG/2ML IN SUSP   Nebulization   Take 2 mLs (0.5 mg total) by nebulization 2 (two) times daily.   2 mL   3   . CALCIUM CARBONATE-VITAMIN D 250-125 MG-UNIT PO TABS   Oral   Take 1 tablet by mouth daily.         [name] CARVEDILOL 12.5 MG PO TABS   Oral   Take 2 tablets (25 mg total) by mouth 2 (two) times daily.   120 tablet   6   . CETIRIZINE HCL 10 MG PO TABS   Oral   Take 10 mg by mouth daily as needed. For seasonal allergies         . DEXLANSOPRAZOLE 60 MG PO CPDR   Oral   Take 60 mg by mouth every morning. Take 15 minutes before breakfast.         . DIOVAN 80 MG PO TABS      TAKE 2 TABLETS 2 TIMES A DAY DAY.   120 tablet   5     PLEASE RESPOND   . FUROSEMIDE 20 MG PO TABS               . INSULIN GLARGINE 100 UNIT/ML [place] SOLN   Subcutaneous   Inject 42 Units into the skin daily.   15 mL   12   . LIDOCAINE-PRILOCAINE 2.5-2.5 % EX CREA      Apply a thumbnail size to port site at least one prior to procedures. Cover with plastic wrap.   30 g   1   . METFORMIN HCL 850 MG PO TABS   Oral   Take 850 mg by mouth 3 (three) times daily.         . MOMETASONE FUROATE 50 MCG/ACT NA SUSP   Nasal   Place 2 sprays into the nose daily. scheduled         . ADULT MULTIVITAMIN W/MINERALS CH   Oral   Take 1 tablet by mouth daily.         [name] OVER THE COUNTER MEDICATION   Oral   Take 1 tablet by mouth daily. Otc.Ferrous 45mg          . OXYCODONE HCL 5 MG PO TABS   Oral   Take 1-2 tablets (5-10 mg total) by mouth every 6 (six) hours  as needed for pain.   60 tablet   0   .  POTASSIUM CHLORIDE ER 10 MEQ PO TBCR      Please do not take this medication until I call you and tell you how to take it.   20 tablet   0   . PREGABALIN 200 MG PO CAPS   Oral   Take 1 capsule (200 mg total) by mouth 2 (two) times daily.   60 capsule   1   . ROSUVASTATIN CALCIUM 5 MG PO TABS   Oral   Take 10 mg by mouth daily.         [name] SITAGLIPTIN PHOSPHATE 100 MG PO TABS   Oral   Take 100 mg by mouth daily.         [name] TIOTROPIUM BROMIDE MONOHYDRATE 18 MCG IN CAPS   Inhalation   Place 1 capsule (18 mcg total) into inhaler and inhale daily.   30 capsule   11   . TRAMADOL HCL 50 MG PO TABS   Oral   Take 1 tablet (50 mg total) by mouth every 6 (six) hours as needed for pain (cough).   60 tablet   2     Allergies: Allergies as of 05/24/2012  . (No Known Allergies)   Past Medical History  Diagnosis Date  . CHF (congestive heart failure)      2-D echo 02/19/2009 showed left ventricle cavity size normal; systolic function normal, estimated ejection fraction 55%; wall motion normal; no regional wall motion abnormalities; PA peak pressure 47mm Hg.  [name] COPD (chronic obstructive pulmonary disease)     Pulmonary function tests on 08/05/2010 showed mixed obstructive and restrictive lung disease with no significant response to bronchodilator, and decreased diffusion capacity that corrects to normal range when adjusted for the inhaled alveolar volume.  . Degenerative joint disease   . AV block, complete     S/P dual-chamber for symptomatic episodes of bradycardia and complete heart block.  . Pacemaker     S/P dual-chamber for symptomatic episodes of bradycardia and complete heart block.  . Adenomatous polyp of colon     Tubular adenomas X 2 found 06/1999; negative screening colonoscopy 02/13/2004 by Dr. [name].  . Hypercholesteremia   . Obstructive sleep apnea     Baseline diagnostic nocturnal polysomnogram on July 27, 2005 showed severe obstructive sleep  apnea/hypopnea syndrome, with AHI 153.2 per hour.  Last nocturnal polysomnogram done for CPAP titration was 05/01/2009; CPAP was titrated to 13 CWP, AHI 1.8 per hour using a large Respironics FitLife Full Face Mask with heated humidifier.  . Peripheral autonomic neuropathy due to diabetes mellitus   . 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 [name]  . Constipation   . Hand pain   . Palpitation   . Rib pain     right sided   . DM type 2 (diabetes mellitus, type 2)   . Skin rash   . Urinary incontinence   . Proliferative diabetic retinopathy(362.02)     S/P panretinal photocoagulation by Dr. [name]  . Vitreous hemorrhage     With tractional detachment, left eye; S/P posterior vitrectomy with membrane peel by Dr. [name] 04/06/2005  . Dyspnea on exertion     Cardiac cath 06/17/2005 by Dr. [name] showed LVEF 50-55%, 30% proximal LAD stenosis, small diagonals.   . Shingles rash 03/25/11  . Breast cancer     S/P left lumpectomy  and axillary node dissection 03/1991 for a T1 N0 medullary breast cancer, no lymph node involvement, treated with tamoxifen for 2 years; S/P right lumpectomy and axillary lymph node dissection August 1995 for a T1 N0 microinvasive breast cancer, treated with Arimidex for 5 years.  Found to have invasive ductal carcinoma of the right breast in 10/ 09/19/2011. Followed by Dr. [name].  . Breast cancer, stage 3 102/13    right hx lumpectomy 1994, bx today=invasive ductal ca  . Allergy     thinitis  . Asthma   . Bronchitis     hx  . Cataract     b/l surgery  . DM hyperosmolarity type II   . Hypertension   . COPD (chronic obstructive pulmonary disease)   . Diastolic CHF   . TIA (transient ischemic attack)   . OSA (obstructive sleep apnea)   . Chronic respiratory failure with hypoxia   . Anemia   . DJD (degenerative joint disease)   . AV block   . DM retinopathy   . DM neuropathies   . Carcinoid tumor of lung    Past  Surgical History  Procedure Date  . Mastectomy partial / lumpectomy w/ axillary lymphadenectomy 09/19/90, 18-Sep-1993    S/P left lumpectomy and axillary lymph node dissection December 1992 for a T1 N0 medullary breast cancer, no lymph node involvement, treated with tamoxifen for 2 years; S/P right lumpectomy and axillary lymph node dissection August 1995 for a T1 N0 microinvasive breast cancer, treated with Arimidex for 5 years.  Patient is followed by Dr. [name] at the cancer center.  . Lung lobectomy 06/13/2001    S/P right lower lobe lobectomy 06/13/2001 by Dr. [name]  . Pacemaker insertion 04/20/2001  . Mastectomy partial / lumpectomy   . Lobectomy   . Pacemaker insertion   . Cholecystectomy   . Tonsillectomy    Family History  Problem Relation Age of Onset  . Breast cancer Mother 45    Deceased September 19, 2002  . Diabetes type II Mother   . Heart attack Brother   . Cervical cancer Neg Hx   . Colon cancer Neg Hx   . Breast cancer Mother   . Diabetes Mellitus II Mother   . Hypertension Brother   . Coronary artery disease Brother   . Stroke Brother   . Diabetes type II Brother   . Hypertension Sister    History   Social History  . Marital Status: Widowed    Spouse Name: N/A    Number of Children: N/A  . Years of Education: N/A   Occupational History  . Not on file.   Social History Main Topics  . Smoking status: Former Smoker -- 1.0 packs/day for 15 years    Types: Cigarettes    Quit date: 04/19/2001  . Smokeless tobacco: Never Used  . Alcohol Use: No  . Drug Use: No  . Sexually Active: No   Other Topics Concern  . Not on file   Social History Narrative   ** Merged History Encounter **     Review of Systems: A comprehensive ROS was performed; pertinent positive and negatives are included in the HPI.  Physical Exam: T 97.8 P 64 BP 120/64  General appearance: alert, cooperative, no distress and sitting in wheelchair Eyes: PERRL, EOMI Throat: lips, mucosa, and tongue  normal; teeth and gums normal Neck: no adenopathy and no JVD Lungs: diminished breath sounds RLL and course BS throughout Heart: regular rate and rhythm, S1, S2 normal,  no murmur, click, rub or gallop Abdomen: soft, non-tender; bowel sounds normal; no masses,  no organomegaly and obese Extremities: 2+ BL pitting edema to mid-calf Pulses: 2+ BL radial pulses Neurologic: Grossly normal  Lab results: BMP 05/24/12 12:11 Na 141 K 5.5 Cl 106 CO2 25 BUN 25 Creatinine 1.62 Calcium 9.0 Glucose 116  HBA1C 05/24/12 11:03 7.0  LFTs 05/24/12 12:11 AP 51 Albumin 2.8 AST 19 ALT 9 Total protein 7.2 Total bilirubin 0.1  ProBNP 05/24/12 12:11 442  CBC 05/24/12 12:11 WBC 9.5 RBC 3.90 HGB 7.5 HCT 27.6 MCV 70.8 MCH 19.2 MCHC 27.2 RDW 23.4 Plts 211  Imaging results:  Dg Chest 2 View  05/24/2012  *RADIOLOGY REPORT*  Clinical Data: Shortness of breath.  CHEST - 2 VIEW  Comparison: 05/18/2012.  Findings: The power port is stable.  The pacer wires are unchanged. The heart is enlarged but stable.  Mediastinal and hilar contours are prominent but unchanged.  There is a persistent moderate-sized right pleural effusion and underlying pulmonary edema.  No pneumothorax.  IMPRESSION: CHF with moderate sized right pleural effusion.   Original Report Authenticated By: Rudie Meyer, M.D.     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  1. SOB/Right pleural effusion: This exacerbation is potentially due to a HF, as she has evidence of volume overload s/p failed outpatient Lasix titration, increasing DOE, and increased LE edema.  Recent Herceptin therapy may have decreased threshold for HF exacerbation.  However, proBNP is not impressively elevated at 442 compared with lowest proBNP of 193.6 on 03/22/12.  Last echo in 03/2012 shows EF of 65-70%, normal systolic function and no comment on diastolic function.  AECOPD also a potential etiology with increased oxygen requirement.  Unlikely to be infectious as she has been afebrile and has nl WBC.  PE is a possibility with her decreased ambulation, but modified Geneva score of 2 and [place] low risk. The most likely etiology of her increasing breathing difficulty is the increase in her R pleural effusion.  This has been documented on CXR since last discharge with evidence of increased size on CXR from 05/24/12. Most likely etiologies of the effusion include HF sequelae versus malignancy.  Thoracentesis would be diagnostic and potentially therapeutic, but loculated nature makes bedside thoracentesis unlikely to be successful. - O2 [place] prn; may progress to requiring more respiratory support - Likely not infectious process, so will hold abx - C/s interventional radiology re: potential US-guided thoracentesis to obtain effusion characteristics and cx - CXR in AM - Continue home Spiriva, Advair, albuterol, budesonide - IV Lasix 40mg  x1 - Strict I/Os  and daily weights  2. ?Acute on chronic kidney injury: Cr has been increasing  gradually over the past month.  Creatinine was in nl range prior to 03/2012.  It does not yet meet criteria for AKI, as it has not increased by 50% in the past 30 days. - Creatinine daily - D/c nephrotoxic agents  3. DM, type 2: HbA1C was 7.0 today, reflecting good control. She has hx of diabetic retinopathy s/p panretinal photocoagultion, diabetic retinopathy and microalbuminuria. - Continue Linagliptin 5mg  daily and insulin glargine 20 units daily - Hold metformin 850mg  daily in setting of increased creatinine - Consider SSI  4. Invasive ductal carcinoma: Grade 2, ER/PR+ of right breast dx in 03/2012.  Fulvestrant was started on 01/28/12 and Herceptin was started on 04/07/12 (EF 55% on 02/20/12).  Herceptin was discontinued, due to increased risk for HF exacerbation. Ffulvestrant therapy will continue per Dr. Darrall Dears recommendation.  - NTD  5. Fe deficiency anemia: Hgb 7.5 on admission, decreased from baseline of 8.3-8.6. - CBC daily - Transfuse PRBCs to maintain Hgb of 7  6. HTN: Stable on admission. - Continue home amlodipine 10mg  daily, carvedilol 25mg  daily, irbesartain 150mg  daily  7. HC: Lipid profile pending.  TGs 177 on 02/21/12. - Continue atorvastatin 10mg  daily  8. Allergic rhinitis: Clinically stable. - Continue Flonase, Claritin 10mg  daily  9. GERD: Clinically stable. - Continue protonix 40mg  daily  10. Back pain/Neuropathy: C/o continued back pain and LE weakness.  Continue home regimen. - Continue Lyrica 200mg  daily, tramadol 50mg  prn, oxycodone IR/Roxicodone 5-10mg  prn  11. Ppx:  - SQ Lovenox 40 mg daily - Protonix 40mg  daily  12. Dispo: Pt will be admitted to the floor.   This is a Psychologist, occupational Note.  The care of the patient was discussed with Dr. Sherrine Maples and the assessment and plan was formulated with their assistance.  Please see their note for official documentation of the patient encounter.   Signed: Wynona Meals Amy 05/24/2012, 3:15 PM

## 2012-05-24 NOTE — Assessment & Plan Note (Signed)
Lipids:    Component Value Date/Time   CHOL 167 09/30/2011 0922   TRIG 177* 02/21/2012 0500   HDL 35* 09/30/2011 0922   LDLCALC 104* 09/30/2011 0922   VLDL 28 09/30/2011 0922   CHOLHDL 4.8 09/30/2011 0922    Assessment: Patient is doing well without apparent side effects on rosuvastatin 10 mg daily.  Plan: Check a lipid panel; continue rosuvastatin 10 mg daily pending that result.

## 2012-05-24 NOTE — Progress Notes (Signed)
Placed patient on 13 cpap with 2lpm bleed in.  Patient tolerating well.

## 2012-05-24 NOTE — Progress Notes (Signed)
Report was called to Nurse on 4700.  Pt was transported to 4713 via wheelchair.  Angelina Ok, RN 05/24/2012 3:52 PM.

## 2012-05-24 NOTE — Assessment & Plan Note (Signed)
Lab Results  Component Value Date   WBC 9.5 05/24/2012   HGB 7.5* 05/24/2012   HCT 27.6* 05/24/2012   MCV 70.8* 05/24/2012   PLT 211 05/24/2012    Assessment: Patient has chronic anemia; her hemoglobin today is slightly below recent baseline.  Plan: Patient is being admitted for management of other problems; would discuss with her hematologist/oncologist Dr. [name] who is managing her breast cancer with current chemotherapy.

## 2012-05-24 NOTE — H&P (Addendum)
Internal Medicine Teaching Service Resident Admission Note Date: 05/24/2012  Patient name: Melissa Villa Medical record number: [id number] Date of birth: [birth year] Age: 67 y.o. Gender: female PCP: [name], MD  Medical Service:  I have reviewed Melissa note by Jaye Beagle, MS III and was present during Melissa interview and physical exam.  Please see below for findings, assessment, and plan.  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   Melissa Villa was hospitalized from 02/18/12-02/25/12 for respiratory failure requiring intubation from AECOPD and acute diastolic heart failure, and from 03/28/12-04/05/12 with VDRF, MRSA pneumonia, and was found to have a loculated R pleural effusion. Melissa Villa has been seen in [facility] clinic multiple times in January 2014 for increasing SOB and nonproductive cough. Dr. Maple Hudson, her pulmonologist saw Melissa Villa on 1/28 and increased her Lasix due to pulmonary edema on CXR. However, per Villa, this did not improve her respiratory status. Melissa Villa also saw her Oncologist, Dr. [name] on 05/19/12 who felt that Melissa Villa did not look well. He stopped her Herceptin as it might make any heart failure Melissa Villa could have worse.    Meds: Medications Prior to Admission  Medication Sig Dispense Refill  . ADVAIR DISKUS 250-50 MCG/DOSE AEPB INHALE ONE PUFF INTO Melissa LUNGS TWICE     DAILY  60 each  5  . AGGRENOX 25-200 MG per 12 hr capsule TAKE ONE CAPSULE TWICE DAILY  62 capsule  5  . albuterol (PROAIR HFA) 108 (90 BASE) MCG/ACT inhaler Inhale 2 puffs into Melissa lungs 4 (four) times daily as needed for wheezing or shortness of breath.  1 Inhaler  11  . albuterol (PROVENTIL) (2.5 MG/3ML) 0.083% nebulizer solution Take 3 mLs (2.5 mg total) by nebulization every 6 (six) hours as needed for wheezing or shortness of breath.  75 mL  12  . amLODipine (NORVASC) 10 MG tablet Take 10 mg by mouth daily before breakfast.      . Bepotastine Besilate (BEPREVE) 1.5 % SOLN Place 1 drop into both eyes 2 (two) times daily.      . budesonide (PULMICORT) 0.5 MG/2ML nebulizer solution Take 2 mLs (0.5 mg total) by nebulization 2 (two) times daily.  2 mL  3  . calcium-vitamin D (OSCAL WITH D) 250-125 MG-UNIT per tablet Take 1 tablet by mouth daily.      . carvedilol (COREG) 12.5 MG tablet Take 2 tablets (25 mg total) by mouth 2 (two) times daily.  120 tablet  6  . cetirizine (ZYRTEC) 10 MG tablet Take 10 mg by mouth daily as needed. For seasonal allergies      . Dexlansoprazole (DEXILANT) 60 MG capsule Take 60 mg by mouth every morning. Take 15 minutes before breakfast.      . DIOVAN 80 MG tablet TAKE 2 TABLETS 2 TIMES A DAY DAY.  120 tablet  5  . furosemide (LASIX) 20 MG tablet       .  insulin glargine (LANTUS SOLOSTAR) 100 UNIT/ML injection Inject 42 Units into Melissa skin daily.  15 mL  12  . lidocaine-prilocaine (EMLA) cream Apply a thumbnail size to port site at least one prior to procedures. Cover with plastic wrap.  30 g  1  . metFORMIN (GLUCOPHAGE) 850 MG tablet Take 850 mg by mouth 3 (three) times daily.      . mometasone (NASONEX) 50 MCG/ACT nasal spray Place 2 sprays into Melissa nose daily. scheduled      . Multiple Vitamin (MULTIVITAMIN WITH MINERALS) TABS Take 1 tablet by mouth daily.      [name] OVER Melissa COUNTER MEDICATION Take 1 tablet by mouth daily. Otc.Ferrous 45mg       . oxyCODONE (OXY  IR/ROXICODONE) 5 MG immediate release tablet Take 1-2 tablets (5-10 mg total) by mouth every 6 (six) hours as needed for pain.  60 tablet  0  . potassium chloride (K-DUR) 10 MEQ tablet Please do not take this medication until I call you and tell you how to take it.  20 tablet  0  . pregabalin (LYRICA) 200 MG capsule Take 1 capsule (200 mg total) by mouth 2 (two) times daily.  60 capsule  1  . rosuvastatin (CRESTOR) 5 MG tablet Take 10 mg by mouth daily.      . sitaGLIPtin (JANUVIA) 100 MG tablet Take 100 mg by mouth daily.      [name] tiotropium (SPIRIVA) 18 MCG inhalation capsule Place 1 capsule (18 mcg total) into inhaler and inhale daily.  30 capsule  11  . traMADol (ULTRAM) 50 MG tablet Take 1 tablet (50 mg total) by mouth every 6 (six) hours as needed for pain (cough).  60 tablet  2    Allergies: Allergies as of 05/24/2012  . (No Known Allergies)    Past Medical History: Medical Student note reviewed  Family History: Medical Student note reviewed  Social History: Medical Student note reviewed  Surgical History: Medical Student note reviewed  Review of System: Medical Student note reviewed  Physical Exam: Blood pressure 122/71, pulse 60, temperature 98.1 F (36.7 C), temperature source Oral, resp. rate 20, height 5' 4.5" (1.638 m), weight 292 lb 5.3 oz (132.6 kg). General: NAD, cooperative on examination.  Head: Normocephalic and atraumatic.  Eyes: Pupils equal, round, and reactive to light, no injection and anicteric.  Neck: Supple, no JVD.  Lungs: Diffusely course breath sounds, diminished on right Heart: Regular rate, regular rhythm, no murmur, no gallop, and no rub.  Abdomen: Obese, soft, non-tender, non-distended, normal bowel sounds, no guarding, no rebound tenderness.  Extremities: 2+ pitting edema of BLE, 2+ radial pulses Neurologic: Alert & oriented X3, non-focal Skin: Turgor normal and no rashes.  Psych: Memory intact for recent and remote, normally interactive,  good eye contact, not anxious appearing, and not depressed appearing.  Labs: Reviewed as noted in [facility] Electronic Record  Imaging: Reviewed as noted in [facility] Electronic Record  Assessment & Plan by Problem: Melissa Villa is a 67yo F with an extensive medical history admitted directly from [facility] clinic for worsening SOB, nonproductive cough, and weakness.   1. SOB/Right pleural effusion: This exacerbation is potentially due to a CHF, as Melissa Villa has evidence of volume overload on exam with increasing SOB and edema, s/p failed Lasix therapy. Recent Herceptin therapy may have decreased threshold for HF exacerbation. However, proBNP is not impressively elevated at 442 compared with lowest proBNP of 193.6 on 03/22/12. Last echo in 03/2012 shows EF of 65-70%, normal systolic  function and no comment on diastolic function. AECOPD also a potential etiology with increased oxygen requirement. Unlikely to be infectious as Melissa Villa has been afebrile and has no leukocytosis. PE is a possibility with her decreased ambulation and h/o cancer, but modified Geneva score is 3, low risk and Wells is 2.5 moderate risk. Melissa most likely etiology of her increasing breathing difficulty is Melissa increase in her R pleural effusion. This has been documented on CXR since last discharge with evidence of increased size on CXR from 05/24/12. Most likely etiologies of Melissa effusion include HF sequelae versus malignancy. Thoracentesis would be diagnostic and potentially therapeutic, but loculated nature makes bedside thoracentesis unlikely to be successful.  - O2 [place] prn - Holding abx, as likely non-infectious process - IR vs [place] for thoracentesis to obtain effusion characteristics and cx  - CXR in AM  - Continue home Spiriva, Advair, albuterol, budesonide  - IV Lasix 40mg  x1  - Strict I/Os and daily weights   2. Possible acute on chronic kidney injury: Cr has been increasing over Melissa past month. Creatinine wnl prior to 03/2012. It does not yet meet  criteria for AKI, as it has not increased by 50% in Melissa past 30 days.  - AM BMP - Holding nephrotoxic agents   3. DM, type 2: HbA1C was 7.0 today. Melissa Villa has hx of diabetic retinopathy s/p panretinal photocoagultion, diabetic retinopathy and microalbuminuria.  - Continuing Linagliptin 5mg  daily and insulin glargine 20 units daily  - Holding metformin 850mg  daily in setting of increased creatinine  - Consider SSI   4. Invasive ductal carcinoma: Grade 2, ER/PR+ of right breast dx in 03/2012. Fulvestrant was started on 01/28/12 and Herceptin was started on 04/07/12 (EF 55% on 02/20/12). Herceptin was discontinued, due to increased risk for HF exacerbation. Fulvestrant therapy per Dr. [name].  5. Fe deficiency anemia: Hgb 7.5 on admission, decreased from baseline of 8.3-8.6. Ferritin 13 on admission. - CBC daily  - Transfuse PRBCs for Hgb<7   6. HTN: Stable on admission. Continuing home amlodipine 10mg  daily, carvedilol 25mg  daily, irbesartain 150mg  daily   7. HC: Lipid profile pending. TGs 177 on 02/21/12.  - Continuing atorvastatin 10mg  daily   8. Allergic rhinitis: Clinically stable.  - Continuing Flonase, Claritin 10mg  daily   9. GERD: Clinically stable.  - Continuing protonix 40mg  daily   10. Back pain/Neuropathy: C/o continued back pain and LE weakness. Continuing home regimen.  - Continuing Lyrica 200mg  daily, tramadol 50mg  prn, oxycodone IR/Roxicodone 5-10mg  prn   11. DVT PPx:  - SQ Lovenox 40 mg daily   12. Dispo: Consider palliative care consult while in [facility] hospital to discuss goals of care.  Signed: Genelle Gather 05/24/2012, 7:05 PM

## 2012-05-24 NOTE — Patient Instructions (Addendum)
General Instructions: Hospital follow-up to be arranged.  Treatment Goals:  Goals (1 Years of Data) as of 05/24/2012          As of Today 05/19/12 05/18/12 05/16/12 05/05/12     Blood Pressure    . Blood Pressure < 140/80  120/64 96/51 100/64 112/72 111/59     Result Component    . HEMOGLOBIN A1C < 7.0  7.0        . LDL CALC < 100            Progress Toward Treatment Goals:  Treatment Goal 05/24/2012  Hemoglobin A1C at goal  Blood pressure at goal    Self Care Goals & Plans:  Self Care Goal 05/24/2012  Manage my medications take my medicines as prescribed; bring my medications to every visit  Monitor my health bring my glucose meter and log to each visit; keep track of my blood glucose  Eat healthy foods eat foods that are low in salt    Home Blood Glucose Monitoring 05/24/2012  Check my blood sugar 3 times a day  When to check my blood sugar before meals     Care Management & Community Referrals:  Referral 05/24/2012  Referrals made for care management support social worker; [facility] case management program  Referrals made to community resources other (see comments)

## 2012-05-24 NOTE — Progress Notes (Signed)
Patient's Hgb is 7.5 and is not on a sliding scale for blood sugar. MD notified and new orders given. Will continue to monitor patient for further changes in condition.

## 2012-05-25 ENCOUNTER — Observation Stay (HOSPITAL_COMMUNITY): Payer: Medicare Other

## 2012-05-25 ENCOUNTER — Other Ambulatory Visit: Payer: Self-pay

## 2012-05-25 ENCOUNTER — Inpatient Hospital Stay (HOSPITAL_COMMUNITY): Payer: Medicare Other

## 2012-05-25 DIAGNOSIS — G9340 Encephalopathy, unspecified: Secondary | ICD-10-CM | POA: Diagnosis not present

## 2012-05-25 DIAGNOSIS — J9620 Acute and chronic respiratory failure, unspecified whether with hypoxia or hypercapnia: Secondary | ICD-10-CM

## 2012-05-25 DIAGNOSIS — C50919 Malignant neoplasm of unspecified site of unspecified female breast: Secondary | ICD-10-CM | POA: Diagnosis present

## 2012-05-25 DIAGNOSIS — I369 Nonrheumatic tricuspid valve disorder, unspecified: Secondary | ICD-10-CM

## 2012-05-25 LAB — BLOOD GAS, ARTERIAL
Bicarbonate: 26.3 mEq/L — ABNORMAL HIGH (ref 20.0–24.0)
Delivery systems: POSITIVE
Drawn by: 22251
Expiratory PAP: 13
Mode: POSITIVE
O2 Content: 5 L/min
Patient temperature: 98.6
TCO2: 28.5 mmol/L (ref 0–100)
pCO2 arterial: 59.8 mmHg (ref 35.0–45.0)
pCO2 arterial: 72.6 mmHg (ref 35.0–45.0)
pH, Arterial: 7.269 — ABNORMAL LOW (ref 7.350–7.450)
pH, Arterial: 7.184 — CL (ref 7.350–7.450)
pO2, Arterial: 210 mmHg — ABNORMAL HIGH (ref 80.0–100.0)

## 2012-05-25 LAB — HEPATIC FUNCTION PANEL
ALT: 8 U/L (ref 0–35)
Alkaline Phosphatase: 51 U/L (ref 39–117)
Bilirubin, Direct: <0.1 mg/dL (ref 0.0–0.3)
Total Bilirubin: 0.2 mg/dL — ABNORMAL LOW (ref 0.3–1.2)

## 2012-05-25 LAB — COMPREHENSIVE METABOLIC PANEL
AST: 14 U/L (ref 0–37)
BUN: 27 mg/dL — ABNORMAL HIGH (ref 6–23)
CO2: 27 mEq/L (ref 19–32)
Chloride: 106 mEq/L (ref 96–112)
Creatinine, Ser: 1.81 mg/dL — ABNORMAL HIGH (ref 0.50–1.10)
GFR calc Af Amer: 33 mL/min — ABNORMAL LOW (ref >90)
GFR calc non Af Amer: 28 mL/min — ABNORMAL LOW (ref >90)
Glucose, Bld: 131 mg/dL — ABNORMAL HIGH (ref 70–99)
Total Bilirubin: 0.1 mg/dL — ABNORMAL LOW (ref 0.3–1.2)

## 2012-05-25 LAB — GLUCOSE, CAPILLARY: Glucose-Capillary: 210 mg/dL — ABNORMAL HIGH (ref 70–99)

## 2012-05-25 LAB — POCT I-STAT 3, ART BLOOD GAS (G3+)
O2 Saturation: 100 %
Patient temperature: 102.5
TCO2: 30 mmol/L (ref 0–100)
pH, Arterial: 7.253 — ABNORMAL LOW (ref 7.350–7.450)

## 2012-05-25 LAB — URINALYSIS, ROUTINE W REFLEX MICROSCOPIC
Bilirubin Urine: NEGATIVE
Glucose, UA: NEGATIVE mg/dL
Hgb urine dipstick: NEGATIVE
Ketones, ur: NEGATIVE mg/dL
Protein, ur: 30 mg/dL — AB
Urobilinogen, UA: 0.2 mg/dL (ref 0.0–1.0)

## 2012-05-25 LAB — MRSA PCR SCREENING: MRSA by PCR: NEGATIVE

## 2012-05-25 LAB — URINE MICROSCOPIC-ADD ON

## 2012-05-25 MED ORDER — ALBUTEROL SULFATE (5 MG/ML) 0.5% IN NEBU
2.5000 mg | INHALATION_SOLUTION | Freq: Four times a day (QID) | RESPIRATORY_TRACT | Status: DC
  Administered 2012-05-25 – 2012-06-02 (×30): 2.5 mg via RESPIRATORY_TRACT
  Filled 2012-05-25 (×33): qty 0.5

## 2012-05-25 MED ORDER — SODIUM CHLORIDE 0.9 % IV SOLN
1.0000 mg/h | INTRAVENOUS | Status: DC
  Administered 2012-05-25: 1 mg/h via INTRAVENOUS
  Filled 2012-05-25: qty 10

## 2012-05-25 MED ORDER — PANTOPRAZOLE SODIUM 40 MG PO PACK
40.0000 mg | PACK | ORAL | Status: DC
  Administered 2012-05-25 – 2012-05-28 (×4): 40 mg
  Filled 2012-05-25 (×6): qty 20

## 2012-05-25 MED ORDER — BIOTENE DRY MOUTH MT LIQD
15.0000 mL | Freq: Four times a day (QID) | OROMUCOSAL | Status: DC
  Administered 2012-05-26 – 2012-05-30 (×20): 15 mL via OROMUCOSAL

## 2012-05-25 MED ORDER — FENTANYL BOLUS VIA INFUSION
25.0000 ug | Freq: Four times a day (QID) | INTRAVENOUS | Status: DC | PRN
  Filled 2012-05-25: qty 100

## 2012-05-25 MED ORDER — IPRATROPIUM BROMIDE 0.02 % IN SOLN
0.5000 mg | Freq: Four times a day (QID) | RESPIRATORY_TRACT | Status: DC
  Administered 2012-05-25 – 2012-06-02 (×30): 0.5 mg via RESPIRATORY_TRACT
  Filled 2012-05-25 (×33): qty 2.5

## 2012-05-25 MED ORDER — CHLORHEXIDINE GLUCONATE 0.12 % MT SOLN
15.0000 mL | Freq: Two times a day (BID) | OROMUCOSAL | Status: DC
  Administered 2012-05-25 – 2012-05-30 (×10): 15 mL via OROMUCOSAL
  Filled 2012-05-25 (×6): qty 15
  Filled 2012-05-25: qty 30
  Filled 2012-05-25 (×2): qty 15

## 2012-05-25 MED ORDER — FENTANYL CITRATE 0.05 MG/ML IJ SOLN
50.0000 ug | INTRAMUSCULAR | Status: DC | PRN
  Administered 2012-05-25 (×2): 50 ug via INTRAVENOUS

## 2012-05-25 MED ORDER — INSULIN ASPART 100 UNIT/ML ~~LOC~~ SOLN
0.0000 [IU] | SUBCUTANEOUS | Status: DC
  Administered 2012-05-25: 4 [IU] via SUBCUTANEOUS
  Administered 2012-05-28 – 2012-05-30 (×8): 3 [IU] via SUBCUTANEOUS
  Administered 2012-05-30: 4 [IU] via SUBCUTANEOUS
  Administered 2012-05-31: 3 [IU] via SUBCUTANEOUS
  Administered 2012-05-31: 7 [IU] via SUBCUTANEOUS
  Administered 2012-05-31: 3 [IU] via SUBCUTANEOUS
  Administered 2012-05-31: 4 [IU] via SUBCUTANEOUS
  Administered 2012-05-31: 3 [IU] via SUBCUTANEOUS
  Administered 2012-06-01: 4 [IU] via SUBCUTANEOUS
  Administered 2012-06-01 (×2): 3 [IU] via SUBCUTANEOUS

## 2012-05-25 MED ORDER — MIDAZOLAM BOLUS VIA INFUSION
1.0000 mg | INTRAVENOUS | Status: DC | PRN
  Filled 2012-05-25: qty 2

## 2012-05-25 MED ORDER — ALBUTEROL SULFATE (5 MG/ML) 0.5% IN NEBU
2.5000 mg | INHALATION_SOLUTION | RESPIRATORY_TRACT | Status: DC | PRN
  Filled 2012-05-25: qty 0.5

## 2012-05-25 MED ORDER — VANCOMYCIN HCL 10 G IV SOLR
2500.0000 mg | Freq: Once | INTRAVENOUS | Status: AC
  Administered 2012-05-25: 2500 mg via INTRAVENOUS
  Filled 2012-05-25: qty 2500

## 2012-05-25 MED ORDER — SODIUM CHLORIDE 0.9 % IV SOLN
25.0000 ug/h | INTRAVENOUS | Status: DC
  Administered 2012-05-25: 50 ug/h via INTRAVENOUS
  Filled 2012-05-25 (×2): qty 50

## 2012-05-25 MED ORDER — PIPERACILLIN-TAZOBACTAM 3.375 G IVPB
3.3750 g | Freq: Three times a day (TID) | INTRAVENOUS | Status: DC
  Administered 2012-05-25 – 2012-05-29 (×11): 3.375 g via INTRAVENOUS
  Filled 2012-05-25 (×13): qty 50

## 2012-05-25 MED ORDER — VANCOMYCIN HCL 10 G IV SOLR
1750.0000 mg | INTRAVENOUS | Status: DC
  Filled 2012-05-25: qty 1750

## 2012-05-25 MED ORDER — ETOMIDATE 2 MG/ML IV SOLN: 10.0000 mg | Freq: Once | INTRAVENOUS | Status: AC

## 2012-05-25 MED ORDER — SODIUM CHLORIDE 0.9 % IV SOLN: INTRAVENOUS | Status: DC

## 2012-05-25 MED ORDER — MIDAZOLAM HCL 2 MG/2ML IJ SOLN
2.0000 mg | INTRAMUSCULAR | Status: DC | PRN
  Administered 2012-05-25 (×2): 2 mg via INTRAVENOUS

## 2012-05-25 NOTE — Progress Notes (Signed)
Subjective: Pt states that she is feeling "ok" this morning. She says that her breathing might be better than yesterday.   Objective: Vital signs in last 24 hours: Filed Vitals:   05/24/12 2055 05/25/12 0212 05/25/12 0615 05/25/12 1043  BP: 142/58 115/63 124/62 140/70  Pulse: 60 87 90 62  Temp: 98.2 F (36.8 C) 97.3 F (36.3 C) 97.7 F (36.5 C)   TempSrc: Oral Oral Oral   Resp: 19 24 24    Height:      Weight:   286 lb 13.1 oz (130.1 kg)   SpO2: 94%  90%    Weight change:   Intake/Output Summary (Last 24 hours) at 05/25/12 1120 Last data filed at 05/25/12 1043  Gross per 24 hour  Intake    546 ml  Output    654 ml  Net   -108 ml   Vitals reviewed. General: Sitting up in bed trying to eat breakfast; very drowsy, appears to fall asleep while talking to me HEENT: PERRL, EOMI Cardiac: RRR, no rubs, murmurs or gallops Pulm:Diffusely course breath sounds, diminished on right Abd: soft, nontender, nondistended, BS present Ext: 2+ pitting edema of BLE, 2+ radial pulses Neuro: Alert and oriented X3, more drowsy today, less interactive  Lab Results: Basic Metabolic Panel:  Lab 05/25/12 1610 05/24/12 1211  NA 140 141  K 5.1 5.5*  CL 106 106  CO2 27 25  GLUCOSE 131* 116*  BUN 27* 25*  CREATININE 1.81* 1.62*  CALCIUM 8.9 9.0  MG -- --  PHOS -- --   Liver Function Tests:  Lab 05/25/12 0535 05/24/12 1211  AST 14 19  ALT 7 9  ALKPHOS 54 51  BILITOT 0.1* 0.1*  PROT 7.2 7.2  ALBUMIN 2.9* 2.8*   CBC:  Lab 05/24/12 1211 05/19/12 1110  WBC 9.5 7.8  NEUTROABS 7.1 5.2  HGB 7.5* 8.3*  HCT 27.6* 28.7*  MCV 70.8* 67.9*  PLT 211 186   BNP:  Lab 05/24/12 1211  PROBNP 442.0*   CBG:  Lab 05/24/12 2054 05/24/12 1642 05/24/12 1055  GLUCAP 124* 104* 149*   Hemoglobin A1C:  Lab 05/24/12 1103  HGBA1C 7.0   Fasting Lipid Panel:  Lab 05/24/12 1211  CHOL 90  HDL 21*  LDLCALC 48  TRIG 960  CHOLHDL 4.3  LDLDIRECT --   Anemia Panel:  Lab 05/24/12 1211   VITAMINB12 --  FOLATE --  FERRITIN 13  TIBC --  IRON --  RETICCTPCT --   Urine Drug Screen: Drugs of Abuse     Component Value Date/Time   LABOPIA POSITIVE* 10/21/2006 1513   COCAINSCRNUR NONE DETECTED 10/21/2006 1513   LABBENZ NONE DETECTED 10/21/2006 1513   AMPHETMU NONE DETECTED 10/21/2006 1513   THCU NONE DETECTED 10/21/2006 1513   LABBARB  Value: NONE DETECTED        DRUG SCREEN FOR MEDICAL PURPOSES ONLY.  IF CONFIRMATION IS NEEDED FOR ANY PURPOSE, NOTIFY LAB WITHIN 5 DAYS. 10/21/2006 1513     Studies/Results: Dg Chest 2 View  05/24/2012  *RADIOLOGY REPORT*  Clinical Data: Shortness of breath.  CHEST - 2 VIEW  Comparison: 05/18/2012.  Findings: The power port is stable.  The pacer wires are unchanged. The heart is enlarged but stable.  Mediastinal and hilar contours are prominent but unchanged.  There is a persistent moderate-sized right pleural effusion and underlying pulmonary edema.  No pneumothorax.  IMPRESSION: CHF with moderate sized right pleural effusion.   Original Report Authenticated By: Rudie Meyer, M.D.  Medications: I have reviewed the patient's current medications. Scheduled Meds:   . amLODipine  10 mg Oral QAC breakfast  . atorvastatin  10 mg Oral q1800  . Bepotastine Besilate  1 drop Both Eyes BID  . budesonide  0.5 mg Nebulization BID  . calcium-vitamin D  0.5 tablet Oral Daily  . carvedilol  25 mg Oral BID  . dipyridamole-aspirin  1 capsule Oral BID  . enoxaparin (LOVENOX) injection  0.5 mg/kg Subcutaneous Q24H  . fluticasone  2 spray Each Nare Daily  . insulin aspart  0-5 Units Subcutaneous QHS  . insulin aspart  0-9 Units Subcutaneous TID WC  . insulin glargine  20 Units Subcutaneous BID  . irbesartan  150 mg Oral BID  . lidocaine-prilocaine   Topical Once  . linagliptin  5 mg Oral Daily  . loratadine  10 mg Oral Daily  . mometasone-formoterol  2 puff Inhalation BID  . multivitamin with minerals  1 tablet Oral Daily  . pantoprazole  40 mg Oral Q1200  .  pregabalin  200 mg Oral BID  . sodium chloride  3 mL Intravenous Q12H  . sodium chloride  3 mL Intravenous Q12H  . tiotropium  18 mcg Inhalation Daily   Continuous Infusions:  PRN Meds:.sodium chloride, acetaminophen, acetaminophen, albuterol, albuterol, ondansetron (ZOFRAN) IV, ondansetron, oxyCODONE, sodium chloride, traMADol Assessment/Plan: Mrs. Everson is a 67yo F with an extensive medical history admitted directly from the IM clinic for worsening SOB, nonproductive cough, and weakness.   1. SOB/Right pleural effusion: Initially thought possibly from CHF exacerbation. However, proBNP is not impressively elevated at 442 and she has been fairly unresponsive to Lasix. She was recently on Herceptin, which could have contributed to CHF, so cannot completely r/o.  AECOPD also a potential etiology with increased oxygen requirement. Unlikely to be infectious as she has been and remains afebrile with no leukocytosis. PE is a possibility with her decreased ambulation and h/o cancer, but modified Geneva score is 3, low risk and Wells is 2.5 moderate risk. She does have this increase in her R pleural effusion from 05/24/12 per CXR. At tis time, the most likely etiologies of the effusion include HF sequelae versus malignancy.  - Checking ABG 2/2 increased somnolence.  - O2 [place] prn with CPAP at night  - Continuing to hold abx, as likely non-infectious process  - US guided thoracentesis with LDH, gram stain, Cx, protein levels, glucose, cell count with diff, and cytology  - CXR in AM  - Continue home Spiriva, Advair, albuterol, budesonide  - Strict I/Os and daily weights   2. Possible acute on chronic kidney injury: Cr has been increasing over the past month. Creatinine wnl prior to 03/2012. She was given IV Lasix 40mg  x 1, and Cr increased overnight. - AM BMP  - Continuing to hold nephrotoxic agents   3. DM, type 2: HbA1C 7.0. She has hx of diabetic retinopathy s/p panretinal photocoagultion, diabetic  retinopathy and microalbuminuria. CBGs have been stable. - Continuing Linagliptin 5mg  daily and insulin glargine 20 units daily  - Holding metformin 850mg  daily in setting of increased creatinine   4. Invasive ductal carcinoma: Grade 2, ER/PR+ of right breast dx in 03/2012. Fulvestrant was started on 01/28/12 and Herceptin was started on 04/07/12 (EF 55% on 02/20/12). Herceptin was discontinued, due to increased risk for HF exacerbation. Currently on Fulvestrant therapy per Dr. [name].   5. H/o Fe deficiency anemia: Hgb 7.5 on admission, decreased from baseline of 8.3-8.6. Ferritin 13 on admission.  AM CBC pending. - F/u am CBC  - Transfuse PRBCs for Hgb<7   6. HTN: BP remains stable. Continuing home amlodipine 10mg  daily, carvedilol 25mg  daily, irbesartain 150mg  daily   7. HC: Lipid profile pending. TGs 177 on 02/21/12.  - Continuing atorvastatin 10mg  daily   8. Allergic rhinitis: Clinically stable.  - Continuing Flonase, Claritin 10mg  daily   9. GERD: Clinically stable.  - Continuing protonix 40mg  daily   10. Back pain/Neuropathy: C/o continued back pain and LE weakness. Continuing home regimen.  - Continuing Lyrica 200mg  daily, tramadol 50mg  prn, oxycodone IR/Roxicodone 5-10mg  prn   11. DVT PPx:  - SQ Lovenox 40 mg daily   12. Dispo: Consider palliative care consult while in the hospital to discuss goals of care.  Disposition is deferred at this time, awaiting improvement of current medical problems.   The patient does have a current PCP [name], MD), therefore will be requiring OPC follow-up after discharge.   The patient does not have transportation limitations that hinder transportation to clinic appointments.  .Services Needed at time of discharge: Y = Yes, Blank = No PT:   OT:   RN:   Equipment:   Other:     LOS: 1 day   Genelle Gather 05/25/2012, 11:20 AM

## 2012-05-25 NOTE — Progress Notes (Signed)
Medical Student Daily Progress Note  Subjective: Pt was groggy when I spoke with her, as she had just woken up.  She states that she is doing ok, although still c/o SOB and TTP of BLE.  Objective: Vital signs in last 24 hours: Filed Vitals:   05/24/12 1912 05/24/12 2055 05/25/12 0212 05/25/12 0615  BP: 99/80 142/58 115/63 124/62  Pulse: 58 60 87 90  Temp:  98.2 F (36.8 C) 97.3 F (36.3 C) 97.7 F (36.5 C)  TempSrc:  Oral Oral Oral  Resp:  19 24 24   Height:      Weight:    130.1 kg (286 lb 13.1 oz)  SpO2:  94%  90%   Weight change:   Intake/Output Summary (Last 24 hours) at 05/25/12 0805 Last data filed at 05/24/12 1710  Gross per 24 hour  Intake      0 ml  Output    104 ml  Net   -104 ml   Physical Exam: BP 124/62  Pulse 90  Temp 97.7 F (36.5 C) (Oral)  Resp 24  Ht 5' 4.5" (1.638 m)  Wt 130.1 kg (286 lb 13.1 oz)  BMI 48.47 kg/m2  SpO2 90% General appearance: alert, cooperative and no distress Neck: no JVD Lungs: diminished breath sounds RLL and course expiratory BS throughout Heart: regular rate and rhythm, S1, S2 normal, no murmur, click, rub or gallop Abdomen: soft, non-tender; bowel sounds normal; no masses,  no organomegaly and obese Extremities: 2+ BLE pitting edema, TTP  Lab Results: Comprehensive metabolic panel Collected:05/25/12 0535  Sodium 140 mEq/L  Potassium 5.1 mEq/L  Chloride 106 mEq/L  CO2 27 mEq/L  Glucose, Bld 131 (H) mg/dL  BUN 27 (H) mg/dL  Creatinine, Ser 6.21 (H) mg/dL  Calcium 8.9 mg/dL  Total Protein 7.2 g/dL  Albumin 2.9 (L) g/dL  AST 14 U/L ALT 7 U/L  Alkaline Phosphatase 54 U/L  Total Bilirubin 0.1 (L) mg/dL  GFR calc non Af Amer 28 (L) mL/min  GFR calc Af Amer 33 (L) mL/min  Studies/Results: Dg Chest 2 View  05/24/2012  *RADIOLOGY REPORT*  Clinical Data: Shortness of breath.  CHEST - 2 VIEW  Comparison: 05/18/2012.  Findings: The power port is stable.  The pacer wires are unchanged. The heart is enlarged but stable.   Mediastinal and hilar contours are prominent but unchanged.  There is a persistent moderate-sized right pleural effusion and underlying pulmonary edema.  No pneumothorax.  IMPRESSION: CHF with moderate sized right pleural effusion.   Original Report Authenticated By: Rudie Meyer, M.D.    Medications: I have reviewed the patient's current medications. Scheduled Meds:   . amLODipine  10 mg Oral QAC breakfast  . atorvastatin  10 mg Oral q1800  . Bepotastine Besilate  1 drop Both Eyes BID  . budesonide  0.5 mg Nebulization BID  . calcium-vitamin D  0.5 tablet Oral Daily  . carvedilol  25 mg Oral BID  . dipyridamole-aspirin  1 capsule Oral BID  . enoxaparin (LOVENOX) injection  0.5 mg/kg Subcutaneous Q24H  . fluticasone  2 spray Each Nare Daily  . insulin aspart  0-5 Units Subcutaneous QHS  . insulin aspart  0-9 Units Subcutaneous TID WC  . insulin glargine  20 Units Subcutaneous BID  . irbesartan  150 mg Oral BID  . lidocaine-prilocaine   Topical Once  . linagliptin  5 mg Oral Daily  . loratadine  10 mg Oral Daily  . mometasone-formoterol  2 puff Inhalation BID  . multivitamin  with minerals  1 tablet Oral Daily  . pantoprazole  40 mg Oral Q1200  . pregabalin  200 mg Oral BID  . sodium chloride  3 mL Intravenous Q12H  . sodium chloride  3 mL Intravenous Q12H  . tiotropium  18 mcg Inhalation Daily   Continuous Infusions:  PRN Meds:.sodium chloride, acetaminophen, acetaminophen, albuterol, albuterol, ondansetron (ZOFRAN) IV, ondansetron, oxyCODONE, sodium chloride, traMADol  Assessment/Plan: Mrs. Rosasco is a 67yo F former smoker with h/o diastolic HF, COPD, DM type 2, carcinoid tumor s/p R lobectomy 2003, complicated breast ca hx, Fe deficiency anemia, OSA and HTN presenting with increasing SOB, weakness, possible acute kidney injury and increasing R pleural effusion.  1. SOB/Right pleural effusion: This exacerbation is potentially due to a HF, as she has evidence of volume overload  s/p failed outpatient Lasix titration, increasing DOE, and increased LE edema. Recent Herceptin therapy may have decreased threshold for HF exacerbation. However, proBNP is not impressively elevated at 442 compared with lowest proBNP of 193.6 on 03/22/12. Last echo in 03/2012 shows EF of 65-70%, normal systolic function and no comment on diastolic function. AECOPD also a potential etiology with increased oxygen requirement. Unlikely to be infectious as she has been afebrile and has nl WBC. PE is a possibility with her decreased ambulation, but modified Geneva score of 2 and [place] low risk. The most likely etiology of her increasing breathing difficulty is the increase in her R pleural effusion. This has been documented on CXR since last discharge with evidence of increased size on CXR from 05/24/12. Most likely etiologies of the effusion include HF sequelae versus malignancy. Thoracentesis would be diagnostic and potentially therapeutic, but loculated nature makes bedside thoracentesis unlikely to be successful.  - O2 [place] prn - Likely not infectious process, so will hold abx  - C/s interventional radiology re: potential US-guided thoracentesis to obtain effusion characteristics and cx - Repeat echo post Herceptin and in setting of new HF-type sx - Continue home Spiriva, Advair, albuterol, budesonide  - IV Lasix 40mg  x1 received at 2:30 this AM; Will not give add'l doses due to rising creatinine and fail to diurese - Strict I/Os and daily weights   2. ?Acute on chronic kidney injury: Cr has been increasing gradually over the past month. Creatinine was in nl range prior to 03/2012. It does not yet meet criteria for AKI, as it has not increased by 50% in the past 30 days.  - Creatinine daily  - D/c nephrotoxic agents   3. DM, type 2: HbA1C was 7.0 today, reflecting good control. She has hx of diabetic retinopathy s/p panretinal photocoagultion, diabetic retinopathy and microalbuminuria.  - Continue  Linagliptin 5mg  daily and insulin glargine 20 units daily  - Hold metformin 850mg  daily in setting of increased creatinine  - SSI   4. Invasive ductal carcinoma: Grade 2, ER/PR+ of right breast dx in 03/2012. Fulvestrant was started on 01/28/12 and Herceptin was started on 04/07/12 (EF 55% on 02/20/12). Herceptin was discontinued, due to increased risk for HF exacerbation. Ffulvestrant therapy will continue per Dr. Darrall Dears recommendation.  - NTD   5. Fe deficiency anemia: Hgb 7.5 on admission, decreased from baseline of 8.3-8.6.  - CBC daily  - Transfuse PRBCs to maintain Hgb of 7   6. HTN: Stable on admission.  - Continue home amlodipine 10mg  daily, carvedilol 25mg  daily, irbesartain 150mg  daily   7. HC: Lipid profile pending. TGs 177 on 02/21/12.  - Continue atorvastatin 10mg  daily   8. Allergic  rhinitis: Clinically stable.  - Continue Flonase, Claritin 10mg  daily   9. GERD: Clinically stable.  - Continue protonix 40mg  daily   10. Back pain/Neuropathy: C/o continued back pain and LE weakness. Continue home regimen.  - Continue Lyrica 200mg  daily, tramadol 50mg  prn, oxycodone IR/Roxicodone 5-10mg  prn   11. OSA: Uses 13 CPAP with 2L O2 at home every night. - Continue nightly use of CPAP with supplemental O2  11. Ppx:  - SQ Lovenox 40 mg daily  - Protonix 40mg  daily   12. Dispo: Pt on the CHF floor pending w/u of SOB and R pleural effusion.    LOS: 1 day   This is a Psychologist, occupational Note.  The care of the patient was discussed with Dr. Sherrine Maples and the assessment and plan formulated with their assistance.  Please see their attached note for official documentation of the daily encounter.  Wynona Meals Amy 05/25/2012, 8:05 AM

## 2012-05-25 NOTE — Progress Notes (Signed)
Patients et tube pulled back to 21cm per MD order.

## 2012-05-25 NOTE — Progress Notes (Signed)
Brief Interval Progress Note  I presented to the patient's bedside around 4:00 pm.  The patient continued to be in respiratory distress, but mental status had declined noticeably from early today.  The patient was now much more somnolent, though still arousable and able to answer questions.  I reviewed the patient's prior code status, which was partial code, as documented in a note 03/28/12 after a conversation with the patient's son and daughter in law.  At that time, the family wanted intubation, but did not want CPR or defibrillation.  I called the patient's son, Melissa Villa, at [phone].  After a long discussion about the patient's condition, the son states that he has had conversations with the patient about this issue in the past.  He states that the patient would want intubation, but would not want CPR or defibrillation.  He expressed understanding that intubation would hopefully be short-term, but could be lifelong.  Patient to be transferred to 2100.  Melissa Villa 05/25/2012, 5:30 PM

## 2012-05-25 NOTE — Progress Notes (Signed)
Patient had critical lab values of:   PH 7.269  CO2 59.8  PO2 65  O2 Sat 90%  BiCarb 26.5  Base excess 0.3   Made MD R. Brown aware. New orders given. Will continue to monitor patient for further changes in condition.

## 2012-05-25 NOTE — Care Management Note (Signed)
  Page 2 of 2   06/06/2012     10:10:05 AM   CARE MANAGEMENT NOTE 06/06/2012  Patient:  Melissa Villa, Melissa Villa   Account Number:  [id number]  Date Initiated:  05/25/2012  Documentation initiated by:  [facility]  Subjective/Objective Assessment:   Chief Complaint: worsening SOB and fatigue     Action/Plan:   Diurese and [facility]   Anticipated DC Date:  06/06/2012   Anticipated DC Plan:  HOME W HOME HEALTH SERVICES      DC Planning Services  CM consult      [facility] Choice  HOME HEALTH   Choice offered to / List presented to:          [facility] arranged  HH-1 RN  HH-10 DISEASE MANAGEMENT  HH-2 PT  HH-6 SOCIAL WORKER      HH agency  [facility]   Status of service:  Completed, signed off Medicare Important Message given?   (If response is "NO", the following Medicare IM given date fields will be blank) Date Medicare IM given:   Date Additional Medicare IM given:    Discharge Disposition:    Per [facility] Regulation:  Reviewed for med. necessity/level of care/duration of stay  If discussed at Long Length of Stay Meetings, dates discussed:    Comments:  06-06-12 Patient discharging today. States she does have home oxygen , unsure of which agency she receives her home oxygen through .  However, her son will bring a portable tank this ag=fternoon for transportation home.   Ronny Flurry RN BSN [phone]    05-29-12 9am Avie Arenas, RNBSN [phone] Weaning actively off vent.  Has been as SNF post last dc. Will need see progression once extubated.  05-26-12 10:30am Avie Arenas, RNBSN [phone] Admitted to ICU for resp failure - initially on bipap - now intubated.  05/25/12 @ 1030.Marland KitchenMarland KitchenSpoke with pt regarding receiving HH services.  Pt chose [facility].  Lupita Leash notified. Oletta Cohn, RN, BSN, [place] [phone].

## 2012-05-25 NOTE — H&P (Signed)
Internal Medicine Attending Admission Note Date: 05/25/2012  Patient name: Melissa Villa Medical record number: [id number] Date of birth: 17-Feb-1946 Age: 67 y.o. Gender: female  I saw and evaluated the patient. I reviewed the resident's note [name] agree with the resident's findings and plan as documented in the resident's note.  Chief Complaint(s): Increasing shortness of breath and fatigue.  History - key components related to admission:  Melissa Villa is a 67 year old woman with a history of breast cancer x 2, carcinoid of the right lower lobe, diastolic heart failure, chronic obstructive pulmonary disease, diabetes, and complete heart block requiring dual-chamber pacemaker who presents with a several week history of increasing shortness of breath and fatigue. She's been in several outpatient clinics in the interim with adjustments in her Lasix dosing. These changes have not resulted in an improvement in her symptoms. She was also started on Herceptin for her most recent breast cancer but this was stopped given the concern that it could result in congestive heart failure. She notes an increase in her nonproductive cough but denies any fevers, shakes, or chills. She has a rattling cough suggesting she may not be mobilizing secretions. She also has not had any chest pain or recent sick contacts. When asked what helps her breathing she states that the CPAP is a considerable relief for her and makes her breathing much easier.  Physical Exam - key components related to admission:  Filed Vitals:   05/25/12 0212 05/25/12 0615 05/25/12 1043 05/25/12 1325  BP: 115/63 124/62 140/70 146/55  Pulse: 87 90 62 61  Temp: 97.3 F (36.3 C) 97.7 F (36.5 C)  99.3 F (37.4 C)  TempSrc: Oral Oral  Oral  Resp: 24 24  24   Height:      Weight:  286 lb 13.1 oz (130.1 kg)    SpO2:  90%  89%   General: Well-developed well-nourished chronically ill-appearing obese woman lying in bed having to work fairly hard to  breathe. She's able to use complete sentences but unable to move about without becoming very dyspneic. Neck: Positive JVD. Lungs: Decreased breath sounds at the right base and rhonchorous breath sounds bilaterally elsewhere. No wheezes.  Heart: Very distant heart sounds that were difficult to auscultate. Abdomen: Soft, nontender, active bowel sounds, obese. Extremities: 2+ pitting edema to the midshin bilaterally.  Lab results:  Basic Metabolic Panel:  Basename 05/25/12 0535 05/24/12 1211  NA 140 141  K 5.1 5.5*  CL 106 106  CO2 27 25  GLUCOSE 131* 116*  BUN 27* 25*  CREATININE 1.81* 1.62*  CALCIUM 8.9 9.0  MG -- --  PHOS -- --   Liver Function Tests:  [facility] 05/25/12 0535 05/24/12 1211  AST 14 19  ALT 7 9  ALKPHOS 54 51  BILITOT 0.1* 0.1*  PROT 7.2 7.2  ALBUMIN 2.9* 2.8*   CBC:  Basename 05/24/12 1211  WBC 9.5  NEUTROABS 7.1  HGB 7.5*  HCT 27.6*  MCV 70.8*  PLT 211   CBG:  Basename 05/25/12 1120 05/25/12 0618 05/24/12 2054 05/24/12 1642 05/24/12 1055  GLUCAP 159* 116* 124* 104* 149*   Hemoglobin A1C:  Basename 05/24/12 1103  HGBA1C 7.0   Fasting Lipid Panel:  Basename 05/24/12 1211  CHOL 90  HDL 21*  LDLCALC 48  TRIG 865  CHOLHDL 4.3  LDLDIRECT --   Anemia Panel:  Basename 05/24/12 1211  VITAMINB12 --  FOLATE --  FERRITIN 13  TIBC --  IRON --  RETICCTPCT --  Misc. Labs:  BNP 442 LDH 292 5 L ABG on CPAP 7.27/60/65  Imaging results:  Dg Chest 2 View  05/24/2012  *RADIOLOGY REPORT*  Clinical Data: Shortness of breath.  CHEST - 2 VIEW  Comparison: 05/18/2012.  Findings: The power port is stable.  The pacer wires are unchanged. The heart is enlarged but stable.  Mediastinal and hilar contours are prominent but unchanged.  There is a persistent moderate-sized right pleural effusion and underlying pulmonary edema.  No pneumothorax.  IMPRESSION: CHF with moderate sized right pleural effusion.   Original Report Authenticated By: Rudie Meyer,  M.D.    Other results:  EKG: Pending  Assessment & Plan by Problem:  Melissa Villa is a 67 year old woman with a history of breast cancer, carcinoid, diabetes, diastolic heart failure, and complete heart block requiring dual-chamber pacemaker who presents with increasing shortness of breath and generalized fatigue. This is in spite of adjustments upwards of her Lasix. This suggests that heart failure may not be the cause of her dyspnea. She also has an increasing right-sided loculated pleural effusion. The cause of this pleural effusion remains unclear and the fact that it is loculated in the past suggest there may have been some inflammation. With its growth, it could be the cause of her dyspnea. Given her underlying malignancy and the fact that she's on an estrogen receptor antagonist she is at increased risk for venous thrombosis. Her acute on chronic renal failure preclude a CT angiogram. The large right pleural effusion makes the VQ scan less helpful. It surely will not rule out a pulmonary embolism and is not likely to be high probability given the underlying radiographic deficit. With the increased lower extremity edema we may have an opportunity to assess for venous thrombosis using venous Dopplers although we have to keep in mind this does not rule out pulmonary embolism. Her response to the CPAP with regards to work of breathing suggests that her fatigue is likely related to this increased work to ventilate. The pleural effusion is a reason why this may be the case. Looking at her CT scan that was done last month there is no evidence of lymphangitic carcinomatosis. There is some suggestion of a small pericardial effusion and her symptoms could be related to enlargement of this effusion. Finally, Herceptin is a known cause of heart failure. Her last echocardiogram was done prior to her Herceptin use. It is possible that she has developed heart failure from the Herceptin and this could explain her  dyspnea, if present.  1) Shortness of breath: Melissa Villa is undergoing a ultrasound-guided thoracentesis of her previously loculated but enlarging pleural effusion. It is hoped this will be not only diagnostic but therapeutic and improve her dyspnea. Several studies will be sent for evidence of an exudative versus transudative effusion as well as cultures and cytology. If we can determine the underlying cause of the pleural effusion we may be able to direct therapy towards that. If she does not improve symptomatically from the thoracentesis she may benefit from a temporary trial of BiPAP therapy to assist with her work of breathing and help her ventilate more easily. An echocardiogram will be obtained to assess for any evidence of an increase in the pericardial effusion. If present, we will look for evidence that it is causing hemodynamic compromise. If not present, we've ruled that out as a possible etiology for her dyspnea. Finally, if the above does not improve her dyspnea or give [place] an answer as to why she has  these symptoms we will obtain a bilateral lower extremity Doppler ultrasound to look for evidence of deep venous thrombosis. Once again, she is not a candidate for a CT angiogram because of her renal function and a VQ scan is not likely to be helpful and would not be the first test that I would go to to assess for evidence of pulmonary embolism at this time. If all else is negative we could consider getting a VQ scan. If she does not get rapid relief in her dyspnea and improvement in her acute respiratory acidosis after the thoracentesis we will transfer to the step down unit for initiation of continuous BiPAP therapy to allow her to rest and by [place] some time to figure out the cause of her symptoms.

## 2012-05-25 NOTE — Consult Note (Signed)
PULMONARY  / CRITICAL CARE MEDICINE  Name: Melissa Villa MRN: [id number] DOB: December 04, 1945    ADMISSION DATE:  05/24/2012 CONSULTATION DATE:  05/25/2012  REFERRING MD :  Mariana Arn  CHIEF COMPLAINT:  Short or breath  BRIEF PATIENT DESCRIPTION:  67 yo female former smoker admitted with dry cough, dyspnea, and weakness.  Developed progressive hypoxic/hypercapnic respiratory failure, fever (Tm101.69F) and acute encephalopathy.  Transferred to ICU 2/06 and PCCM assumed medical care.  She was in hospital in December 2013 for PNA, Rt Empyema, AECOPD, and CHF exacerbation.  Significant PMHx of COPD on home oxygen, OSA/OHS, AV block s/p PM, Diastolic CHF, DM type II, Neuropathy, s/p RLLectomy for carcinoid, Breast cancer  SIGNIFICANT EVENTS: 2/05 Admit 2/06 Transfer to ICU, VDRF  STUDIES:  2/06 Echo >> EF 60 to 65%, grade 1 diastolic dysfx, PAS 104 mmHg  LINES / TUBES: ETT 2/06 >> Rt IJ CVL 2/06 >>   CULTURES: Blood 2/06 >>  Sputum 2/06>>  ANTIBIOTICS: Vancomycin 2/06 >> Zosyn 2/06 >>   PAST MEDICAL HISTORY :  Past Medical History  Diagnosis Date  . CHF (congestive heart failure)      2-D echo 02/19/2009 showed left ventricle cavity size normal; systolic function normal, estimated ejection fraction 55%; wall motion normal; no regional wall motion abnormalities; PA peak pressure 47mm Hg.  [name] COPD (chronic obstructive pulmonary disease)     Pulmonary function tests on 08/05/2010 showed mixed obstructive and restrictive lung disease with no significant response to bronchodilator, and decreased diffusion capacity that corrects to normal range when adjusted for the inhaled alveolar volume.  . Degenerative joint disease   . AV block, complete     S/P dual-chamber for symptomatic episodes of bradycardia and complete heart block.  . Pacemaker     S/P dual-chamber for symptomatic episodes of bradycardia and complete heart block.  . Adenomatous polyp of colon     Tubular adenomas X 2 found  06/1999; negative screening colonoscopy 02/13/2004 by Dr. [name].  . Hypercholesteremia   . Obstructive sleep apnea     Baseline diagnostic nocturnal polysomnogram on July 27, 2005 showed severe obstructive sleep apnea/hypopnea syndrome, with AHI 153.2 per hour.  Last nocturnal polysomnogram done for CPAP titration was 05/01/2009; CPAP was titrated to 13 CWP, AHI 1.8 per hour using a large Respironics FitLife Full Face Mask with heated humidifier.  . Peripheral autonomic neuropathy due to diabetes mellitus   . 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 [name]  . Constipation   . Hand pain   . Palpitation   . Rib pain     right sided   . DM type 2 (diabetes mellitus, type 2)   . Skin rash   . Urinary incontinence   . Proliferative diabetic retinopathy(362.02)     S/P panretinal photocoagulation by Dr. [name]  . Vitreous hemorrhage     With tractional detachment, left eye; S/P posterior vitrectomy with membrane peel by Dr. [name] 04/06/2005  . Dyspnea on exertion     Cardiac cath 06/17/2005 by Dr. [name] showed LVEF 50-55%, 30% proximal LAD stenosis, small diagonals.   . Shingles rash 03/25/11  . Breast cancer     S/P left lumpectomy and axillary node dissection 03/1991 for a T1 N0 medullary breast cancer, no lymph node involvement, treated with tamoxifen for 2 years; S/P right lumpectomy and axillary lymph node dissection August 1995 for a T1 N0 microinvasive breast cancer, treated  with Arimidex for 5 years.  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 Followed by Dr. [name].  . Breast cancer, stage 3 102/13    right hx lumpectomy 1994, bx today=invasive ductal ca  . Allergy     thinitis  . Asthma   . Bronchitis     hx  . Cataract     b/l surgery  . DM hyperosmolarity type II   . Hypertension   . COPD (chronic obstructive pulmonary disease)   . Diastolic CHF   . TIA (transient ischemic attack)   .  OSA (obstructive sleep apnea)   . Chronic respiratory failure with hypoxia   . Anemia   . DJD (degenerative joint disease)   . AV block   . DM retinopathy   . DM neuropathies   . Carcinoid tumor of lung   . Renal insufficiency    Past Surgical History  Procedure Date  . 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  Patient is followed by Dr. [name] at the cancer center.  . Lung lobectomy 06/13/2001    S/P right lower lobe lobectomy 06/13/2001 by Dr. [name]  . Pacemaker insertion 04/20/2001  . Mastectomy partial / lumpectomy   . Lobectomy   . Pacemaker insertion   . Cholecystectomy   . Tonsillectomy   . Port  a  cath    Prior to Admission medications   Medication Sig Start Date End Date Taking? Authorizing Provider  albuterol (PROAIR HFA) 108 (90 BASE) MCG/ACT inhaler Inhale 2 puffs into the lungs 4 (four) times daily as needed for wheezing or shortness of breath. 09/14/11  Yes [name], MD  albuterol (PROVENTIL) (2.5 MG/3ML) 0.083% nebulizer solution Take 3 mLs (2.5 mg total) by nebulization every 6 (six) hours as needed for wheezing or shortness of breath. 04/05/12 04/05/13 Yes Dorothea Ogle, MD  amLODipine (NORVASC) 10 MG tablet Take 10 mg by mouth daily before breakfast.   Yes Historical Provider, MD  Bepotastine Besilate (BEPREVE) 1.5 % SOLN Place 1 drop into both eyes 2 (two) times daily.   Yes Historical Provider, MD  budesonide (PULMICORT) 0.5 MG/2ML nebulizer solution Take 2 mLs (0.5 mg total) by nebulization 2 (two) times daily. 04/05/12  Yes Dorothea Ogle, MD  carvedilol (COREG) 12.5 MG tablet Take 2 tablets (25 mg total) by  mouth 2 (two) times daily. 01/17/12  Yes [name], MD  cetirizine (ZYRTEC) 10 MG tablet Take 10 mg by mouth daily.   Yes Historical Provider, MD  Dexlansoprazole (DEXILANT) 60 MG capsule Take 60 mg by mouth every morning. Take 15 minutes before breakfast.   Yes Historical Provider, MD  dipyridamole-aspirin (AGGRENOX) 200-25 MG per 12 hr capsule Take 1 capsule by mouth 2 (two) times daily.   Yes Historical Provider, MD  Fluticasone-Salmeterol (ADVAIR) 250-50 MCG/DOSE AEPB Inhale 1 puff into the lungs every 12 (twelve) hours.   Yes Historical Provider, MD  furosemide (LASIX) 40 MG tablet Take 40 mg by mouth daily.   Yes Historical Provider, MD  insulin glargine (LANTUS) 100 UNIT/ML injection Inject 42 Units into the skin at bedtime.   Yes Historical Provider,  MD  IRON PO Take 1 tablet by mouth daily.   Yes Historical Provider, MD  lidocaine-prilocaine (EMLA) cream Apply 1 application topically once. Before procedure   Yes Historical Provider, MD  metFORMIN (GLUCOPHAGE) 850 MG tablet Take 850 mg by mouth 3 (three) times daily.   Yes Historical Provider, MD  mometasone (NASONEX) 50 MCG/ACT nasal spray Place 2 sprays into the nose daily. scheduled   Yes Historical Provider, MD  oxyCODONE (OXY IR/ROXICODONE) 5 MG immediate release tablet Take 1-2 tablets (5-10 mg total) by mouth every 6 (six) hours as needed for pain. 05/18/12  Yes Lorretta Harp, MD  pregabalin (LYRICA) 200 MG capsule Take 1 capsule (200 mg total) by mouth 2 (two) times daily. 05/18/12  Yes Ozzie Hoyle Draper, DO  rosuvastatin (CRESTOR) 5 MG tablet Take 10 mg by mouth daily.   Yes Historical Provider, MD  sitaGLIPtin (JANUVIA) 100 MG tablet Take 100 mg by mouth daily.   Yes Historical Provider, MD  tiotropium (SPIRIVA) 18 MCG inhalation capsule Place 1 capsule (18 mcg total) into inhaler and inhale daily. 08/16/11  Yes [name], MD  traMADol (ULTRAM) 50 MG tablet Take 50 mg by mouth every 6 (six) hours as needed. For pain   Yes  Historical Provider, MD  valsartan (DIOVAN) 80 MG tablet Take 160 mg by mouth 2 (two) times daily.   Yes Historical Provider, MD  calcium-vitamin D (OSCAL WITH D) 250-125 MG-UNIT per tablet Take 1 tablet by mouth daily.    Historical Provider, MD  Multiple Vitamin (MULTIVITAMIN WITH MINERALS) TABS Take 1 tablet by mouth daily.    Historical Provider, MD   No Known Allergies  FAMILY HISTORY:  Family History  Problem Relation Age of Onset  . Breast cancer Mother 90    Deceased 09/08/02  . Diabetes type II Mother   . Heart attack Brother   . Cervical cancer Neg Hx   . Colon cancer Neg Hx   . Breast cancer Mother   . Diabetes Mellitus II Mother   . Hypertension Brother   . Coronary artery disease Brother   . Stroke Brother   . Diabetes type II Brother   . Hypertension Sister    SOCIAL HISTORY:  reports that she quit smoking about 11 years ago. Her smoking use included Cigarettes. She has a 15 pack-year smoking history. She has never used smokeless tobacco. She reports that she does not drink alcohol or use illicit drugs.  REVIEW OF SYSTEMS:   Unable to obtain due to altered mental status.  SUBJECTIVE:  Unresponsive.  VITAL SIGNS: Temp:  [97.3 F (36.3 C)-101.7 F (38.7 C)] 101.7 F (38.7 C) (02/06 1830) Pulse Rate:  [58-90] 60  (02/06 1830) Resp:  [6-34] 6  (02/06 1830) BP: (92-155)/(42-80) 92/43 mmHg (02/06 1830) SpO2:  [88 %-100 %] 100 % (02/06 1830) FiO2 (%):  [50 %-100 %] 100 % (02/06 1805) Weight:  [286 lb 13.1 oz (130.1 kg)] 286 lb 13.1 oz (130.1 kg) (02/06 0615) HEMODYNAMICS:   VENTILATOR SETTINGS: Vent Mode:  [-] PRVC FiO2 (%):  [50 %-100 %] 100 % Set Rate:  [18 bmp] 18 bmp Vt Set:  [480 mL] 480 mL PEEP:  [5 cmH20] 5 cmH20 Plateau Pressure:  [29 cmH20] 29 cmH20 INTAKE / OUTPUT: Intake/Output      02/05 0701 - 02/06 0700 02/06 0701 - 02/07 0700   P.O. 300 480   I.V. (mL/kg)  6 (0)   Total Intake(mL/kg) 300 (2.3) 486 (3.7)   Urine (  mL/kg/hr) 104 (0) 850 (0.5)    Total Output 104 850   Net +196 -364          PHYSICAL EXAMINATION: General:  Ill appearing, wearing BiPAP Neuro:  Obtunded HEENT:  BiPAP mask in place Cardiovascular:  s1s2 regular, no murmur Lungs:  B/l rhonchi, no wheeze, decreased BS at Rt base Abdomen:  Obese, soft, decreased bowel sounds Musculoskeletal:  No edema Skin:  No rashes  LABS:  Lab 05/25/12 1715 05/25/12 1220 05/25/12 0535 05/24/12 1211 05/19/12 1110  HGB -- -- -- 7.5* 8.3*  WBC -- -- -- 9.5 7.8  PLT -- -- -- 211 186  NA -- -- 140 141 --  K -- -- 5.1 5.5* --  CL -- -- 106 106 --  CO2 -- -- 27 25 --  GLUCOSE -- -- 131* 116* --  BUN -- -- 27* 25* --  CREATININE -- -- 1.81* 1.62* --  CALCIUM -- -- 8.9 9.0 --  MG -- -- -- -- --  PHOS -- -- -- -- --  AST -- -- 1415 19 --  ALT -- -- 78 9 --  ALKPHOS -- -- 5451 51 --  BILITOT -- -- 0.1*0.2* 0.1* --  PROT -- -- 7.27.2 7.2 --  ALBUMIN -- -- 2.9*2.8* 2.8* --  APTT -- -- -- -- --  INR -- -- -- -- --  LATICACIDVEN -- -- -- -- --  TROPONINI -- -- -- -- --  PROCALCITON -- -- -- -- --  PROBNP -- -- -- 442.0* --  O2SATVEN -- -- -- -- --  PHART 7.184* 7.269* -- -- --  PCO2ART 72.6* 59.8* -- -- --  PO2ART 210.0* 65.1* -- -- --    Lab 05/25/12 1636 05/25/12 1120 05/25/12 0618 05/24/12 2054 05/24/12 1642  GLUCAP 210* 159* 116* 124* 104*    Imaging:  Dg Chest 2 View  05/24/2012  *RADIOLOGY REPORT*  Clinical Data: Shortness of breath.  CHEST - 2 VIEW  Comparison: 05/18/2012.  Findings: The power port is stable.  The pacer wires are unchanged. The heart is enlarged but stable.  Mediastinal and hilar contours are prominent but unchanged.  There is a persistent moderate-sized right pleural effusion and underlying pulmonary edema.  No pneumothorax.  IMPRESSION: CHF with moderate sized right pleural effusion.   Original Report Authenticated By: Rudie Meyer, M.D.      ASSESSMENT / PLAN:  PULMONARY A: Acute on chronic respiratory failure 2nd to PNA and  recurrent Rt pleural effusion. 2nd pulmonary hypertension. Hx of COPD. Hx of OSA/OHS. P:   Full vent support F/u CXR, ABG Scheduled BD's F/u CT chest w/o contrast 2/06 >>   CARDIOVASCULAR A: Hx of CAD, HTN, Diastolic CHF. Hypotension after intubation 2/06. P:  Monitor CVP Check lactic acid, cortisol Hold amlodipine, coreg, avapro, lipitor for now  Lovenox for DVT prophylaxis  RENAL A:  Acute on chronic renal failure. Hyperkalemia. P:   Hold lasix F/u BMET Monitor urine outpt  GASTROINTESTINAL A:  Morbid obesity. Nutrition. P:   NPO while on vent May need tube feeds if unable to wean from vent soon Protonix for SUP  HEMATOLOGIC A:  Anemia of chronic disease and critical illness. Hx of Breast cancer. P:  F/u CBC Transfuse for Hb < 7  INFECTIOUS A:  HCAP. P:   Start vancomycin, zosyn 2/06 F/u Cx   ENDOCRINE A:  DM type II with hyperglycemia. P:   SSI Hold lantus Hold tradjenta  NEUROLOGIC A:  Acute encephalopathy 2nd  to hypercapnia. P:   Sedation protocol while on vent  TODAY'S SUMMARY:  Teaching service has discussed with pt's family.  They are agreeable to trial of intubation.  They would not want CPR/Defibrillation.  I have personally obtained a history, examined the patient, evaluated laboratory and imaging results, formulated the assessment and plan and placed orders. CRITICAL CARE: The patient is critically ill with multiple organ systems failure and requires high complexity decision making for assessment and support, frequent evaluation and titration of therapies, application of advanced monitoring technologies and extensive interpretation of multiple databases. Critical Care Time devoted to patient care services described in this note is 50 minutes.   Coralyn Helling, MD [facility] Pulmonary/Critical Care 05/25/2012, 6:54 PM Pager:  [phone] After 3pm call: [phone]

## 2012-05-25 NOTE — Progress Notes (Signed)
Patient has been very lethargic all day. Patient stated that she wanted to be left alone because she was very sleepy and every time that she went to sleep last night someone was in her room, doing something. Made MD aware. MD state that patient will possibly be transferred to step down or ICU unit. Made charge nurse aware of MD's plans of transfer. Will continue to monitor patient for further changes in condition.

## 2012-05-25 NOTE — Significant Event (Signed)
Rapid Response Event Note  Overview: Time Called: 1642 Arrival Time: 1646 Event Type: Respiratory;Neurologic  Initial Focused Assessment: Upon arrival patient lethargic, on Bipap.  Lung sounds coarse/rhonchi, diminished on Right Bp 147/76  HR paced 60, RR 30 Bipap rate 16 16/8 & 50%.  O2 sat 88%   Interventions: Increased FiO2 to 100%, sats 98% MD consulted CCM ABG done Transferred to 2100 via bed with bipap and heart monitor  Event Summary: Name of Physician Notified: Dr Janalyn Harder at bedside upon arrival at    Name of Consulting Physician Notified: Tyson Alias at 1700  Outcome: Transferred (Comment) (ICU)     Marcellina Millin

## 2012-05-25 NOTE — Progress Notes (Signed)
ANTIBIOTIC CONSULT NOTE - INITIAL  Pharmacy Consult for Vancomycin and Zosyn Indication: rule out pneumonia  No Known Allergies  Patient Measurements: Height: 5' 4.5" (163.8 cm) Weight: 286 lb 13.1 oz (130.1 kg) IBW/kg (Calculated) : 55.85   Vital Signs: Temp: 99.3 F (37.4 C) (02/06 1325) Temp src: Oral (02/06 1325) BP: 112/42 mmHg (02/06 1805) Pulse Rate: 60  (02/06 1805) Intake/Output from previous day: 02/05 0701 - 02/06 0700 In: 300 [P.O.:300] Out: 104 [Urine:104] Intake/Output from this shift: Total I/O In: 486 [P.O.:480; I.V.:6] Out: 850 [Urine:850]  Labs:  [facility] 05/25/12 0535 05/24/12 1211  WBC -- 9.5  HGB -- 7.5*  PLT -- 211  LABCREA -- --  CREATININE 1.81* 1.62*   Estimated Creatinine Clearance: 42.4 ml/min (by C-G formula based on Cr of 1.81).    Microbiology: Recent Results (from the past 720 hour(s))  TECHNOLOGIST REVIEW     Status: Normal   Collection Time   05/05/12 11:01 AM      Component Value Range Status Comment   Technologist Review 1 %nRBC   Final     Medical History: Past Medical History  Diagnosis Date  . CHF (congestive heart failure)      2-D echo 02/19/2009 showed left ventricle cavity size normal; systolic function normal, estimated ejection fraction 55%; wall motion normal; no regional wall motion abnormalities; PA peak pressure 47mm Hg.  [name] COPD (chronic obstructive pulmonary disease)     Pulmonary function tests on 08/05/2010 showed mixed obstructive and restrictive lung disease with no significant response to bronchodilator, and decreased diffusion capacity that corrects to normal range when adjusted for the inhaled alveolar volume.  . Degenerative joint disease   . AV block, complete     S/P dual-chamber for symptomatic episodes of bradycardia and complete heart block.  . Pacemaker     S/P dual-chamber for symptomatic episodes of bradycardia and complete heart block.  . Adenomatous polyp of colon     Tubular adenomas X 2  found 06/1999; negative screening colonoscopy 02/13/2004 by Dr. [name].  . Hypercholesteremia   . Obstructive sleep apnea     Baseline diagnostic nocturnal polysomnogram on July 27, 2005 showed severe obstructive sleep apnea/hypopnea syndrome, with AHI 153.2 per hour.  Last nocturnal polysomnogram done for CPAP titration was 05/01/2009; CPAP was titrated to 13 CWP, AHI 1.8 per hour using a large Respironics FitLife Full Face Mask with heated humidifier.  . Peripheral autonomic neuropathy due to diabetes mellitus   . 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 [name]  . Constipation   . Hand pain   . Palpitation   . Rib pain     right sided   . DM type 2 (diabetes mellitus, type 2)   . Skin rash   . Urinary incontinence   . Proliferative diabetic retinopathy(362.02)     S/P panretinal photocoagulation by Dr. [name]  . Vitreous hemorrhage     With tractional detachment, left eye; S/P posterior vitrectomy with membrane peel by Dr. [name] 04/06/2005  . Dyspnea on exertion     Cardiac cath 06/17/2005 by Dr. [name] showed LVEF 50-55%, 30% proximal LAD stenosis, small diagonals.   . Shingles rash 03/25/11  . Breast cancer     S/P left lumpectomy and axillary node dissection 03/1991 for a T1 N0 medullary breast cancer, no lymph node involvement, treated with tamoxifen for 2 years; S/P right lumpectomy and axillary lymph node dissection August  1995 for a T1 N0 microinvasive breast cancer, treated with Arimidex for 5 years.  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 Followed by Dr. [name].  . Breast cancer, stage 3 102/13    right hx lumpectomy 1994, bx today=invasive ductal ca  . Allergy     thinitis  . Asthma   . Bronchitis     hx  . Cataract     b/l surgery  . DM hyperosmolarity type II   . Hypertension   . COPD (chronic obstructive pulmonary disease)   . Diastolic CHF   . TIA (transient ischemic attack)    . OSA (obstructive sleep apnea)   . Chronic respiratory failure with hypoxia   . Anemia   . DJD (degenerative joint disease)   . AV block   . DM retinopathy   . DM neuropathies   . Carcinoid tumor of lung   . Renal insufficiency     Medications:  Prescriptions prior to admission  Medication Sig Dispense Refill  . albuterol (PROAIR HFA) 108 (90 BASE) MCG/ACT inhaler Inhale 2 puffs into the lungs 4 (four) times daily as needed for wheezing or shortness of breath.  1 Inhaler  11  . albuterol (PROVENTIL) (2.5 MG/3ML) 0.083% nebulizer solution Take 3 mLs (2.5 mg total) by nebulization every 6 (six) hours as needed for wheezing or shortness of breath.  75 mL  12  . amLODipine (NORVASC) 10 MG tablet Take 10 mg by mouth daily before breakfast.      . Bepotastine Besilate (BEPREVE) 1.5 % SOLN Place 1 drop into both eyes 2 (two) times daily.      . budesonide (PULMICORT) 0.5 MG/2ML nebulizer solution Take 2 mLs (0.5 mg total) by nebulization 2 (two) times daily.  2 mL  3  . carvedilol (COREG) 12.5 MG tablet Take 2 tablets (25 mg total) by mouth 2 (two) times daily.  120 tablet  6  . cetirizine (ZYRTEC) 10 MG tablet Take 10 mg by mouth daily.      [name] Dexlansoprazole (DEXILANT) 60 MG capsule Take 60 mg by mouth every morning. Take 15 minutes before breakfast.      . dipyridamole-aspirin (AGGRENOX) 200-25 MG per 12 hr capsule Take 1 capsule by mouth 2 (two) times daily.      . Fluticasone-Salmeterol (ADVAIR) 250-50 MCG/DOSE AEPB Inhale 1 puff into the lungs every 12 (twelve) hours.      . furosemide (LASIX) 40 MG tablet Take 40 mg by mouth daily.      . insulin glargine (LANTUS) 100 UNIT/ML injection Inject 42 Units into the skin at bedtime.      . IRON PO Take 1 tablet by mouth daily.      [name] lidocaine-prilocaine (EMLA) cream Apply 1 application topically once. Before procedure      . metFORMIN (GLUCOPHAGE) 850 MG tablet Take 850 mg by mouth 3 (three) times daily.      . mometasone (NASONEX) 50  MCG/ACT nasal spray Place 2 sprays into the nose daily. scheduled      . oxyCODONE (OXY IR/ROXICODONE) 5 MG immediate release tablet Take 1-2 tablets (5-10 mg total) by mouth every 6 (six) hours as needed for pain.  60 tablet  0  . pregabalin (LYRICA) 200 MG capsule Take 1 capsule (200 mg total) by mouth 2 (two) times daily.  60 capsule  1  . rosuvastatin (CRESTOR) 5 MG tablet Take 10 mg by mouth daily.      [name]  sitaGLIPtin (JANUVIA) 100 MG tablet Take 100 mg by mouth daily.      [name] tiotropium (SPIRIVA) 18 MCG inhalation capsule Place 1 capsule (18 mcg total) into inhaler and inhale daily.  30 capsule  11  . traMADol (ULTRAM) 50 MG tablet Take 50 mg by mouth every 6 (six) hours as needed. For pain      . valsartan (DIOVAN) 80 MG tablet Take 160 mg by mouth 2 (two) times daily.      . calcium-vitamin D (OSCAL WITH D) 250-125 MG-UNIT per tablet Take 1 tablet by mouth daily.      . Multiple Vitamin (MULTIVITAMIN WITH MINERALS) TABS Take 1 tablet by mouth daily.       Assessment: 66 yo F admitted 05/24/12 with increased SOB and fatigue.  Over the lst 24 hours, pt respiratory status has worsened requiring transfer to ICU and intubation.  Of note, pt has known R pleural effusion and  potential plans for US-guided thoracentesis per IR.  To start empiric abx for PNA coverage.  Renal function has declined with SCr 1.81 (baseline 1.1).  CrCl ~ 35 ml/min.  Goal of Therapy:  Vancomycin trough level 15-20 mcg/ml  Plan:  Vancomycin 2500 mg IV x 1 dose. Vancomycin 1750 q48 hours. Zosyn 3.375 gm IV q8h (4 hour infusion). Will follow-up culture data, renal function, and clinical progress.  [employer], Pharm.D., BCPS Clinical Pharmacist Pager [phone] 05/25/2012 6:56 PM

## 2012-05-25 NOTE — Care Management Utilization Note (Signed)
UR complete. Chart reviewed.   Kemara Quigley,RN,BSN [phone] 

## 2012-05-25 NOTE — Procedures (Signed)
Intubation Procedure Note ALLIX BLOMQUIST [id number] [birth year]  Procedure: Intubation Indications: Respiratory insufficiency  Procedure Details Consent: Risks of procedure as well as the alternatives and risks of each were explained to the (patient/caregiver).  Consent for procedure obtained. Time Out: Verified patient identification, verified procedure, site/side was marked, verified correct patient position, special equipment/implants available, medications/allergies/relevent history reviewed, required imaging and test results available.  Performed  Maximum sterile technique was used including gloves and hand hygiene.  MAC and 3.  Size 7.5 ETT.  Inserted to 23 cm at lip.  Given 2 mg versed, 50 mcg fentanyl, 10 mg etomidate for procedure.    Evaluation Hemodynamic Status: BP stable throughout; O2 sats: Transiently fell. Patient's Current Condition: stable Complications: No apparent complications Patient did tolerate procedure well. Chest X-ray ordered to verify placement.  CXR: pending.   Coralyn Helling, MD [facility] Pulmonary/Critical Care 05/25/2012, 6:17 PM Pager:  [phone] After 3pm call: [phone]

## 2012-05-25 NOTE — Progress Notes (Signed)
Patient successfully transferred to ICU.

## 2012-05-25 NOTE — Progress Notes (Signed)
Resident Co-sign Daily Note: I have seen the patient and reviewed the daily progress note by Jaye Beagle, MS-II and discussed the care of the patient with them.  See my separate note for documentation of my findings, assessment, and plans.   LOS: 1 day   Genelle Gather 05/25/2012, 5:34 PM

## 2012-05-25 NOTE — Progress Notes (Signed)
Echocardiogram 2D Echocardiogram has been performed.  Melissa Villa 05/25/2012, 4:00 PM

## 2012-05-25 NOTE — Procedures (Signed)
Central Venous Catheter Insertion Procedure Note Melissa Villa [id number] Nov 03, 1945  Procedure: Insertion of Central Venous Catheter Indications: Drug and/or fluid administration  Procedure Details Consent: Unable to obtain consent because of emergent medical necessity. Time Out: Verified patient identification, verified procedure, site/side was marked, verified correct patient position, special equipment/implants available, medications/allergies/relevent history reviewed, required imaging and test results available.  Performed  Maximum sterile technique was used including antiseptics, cap, gloves, gown, hand hygiene, mask and sheet. Skin prep: Chlorhexidine; local anesthetic administered A antimicrobial bonded/coated triple lumen catheter was placed in the right internal jugular vein using the Seldinger technique. Placed with ultrasound guidance.  Evaluation Blood flow good Complications: No apparent complications Patient did tolerate procedure well. Chest X-ray ordered to verify placement.  CXR: pending.  Performed by Dr. Janalyn Harder.  I was present and scrubbed for entire procedure.  Coralyn Helling, MD [facility] Pulmonary/Critical Care 05/25/2012, 6:54 PM Pager:  [phone] After 3pm call: [phone]

## 2012-05-26 ENCOUNTER — Inpatient Hospital Stay (HOSPITAL_COMMUNITY): Payer: Medicare Other

## 2012-05-26 DIAGNOSIS — M79609 Pain in unspecified limb: Secondary | ICD-10-CM

## 2012-05-26 DIAGNOSIS — J90 Pleural effusion, not elsewhere classified: Secondary | ICD-10-CM

## 2012-05-26 LAB — CORTISOL: Cortisol, Plasma: 23.1 ug/dL

## 2012-05-26 LAB — BLOOD GAS, ARTERIAL
Drawn by: 352351
MECHVT: 480 mL
MECHVT: 480 mL
PEEP: 5 cmH2O
Patient temperature: 102.8
RATE: 18 resp/min
TCO2: 27 mmol/L (ref 0–100)
pCO2 arterial: 51 mmHg — ABNORMAL HIGH (ref 35.0–45.0)
pH, Arterial: 7.308 — ABNORMAL LOW (ref 7.350–7.450)
pH, Arterial: 7.329 — ABNORMAL LOW (ref 7.350–7.450)

## 2012-05-26 LAB — POCT I-STAT 3, ART BLOOD GAS (G3+)
Bicarbonate: 27.3 mEq/L — ABNORMAL HIGH (ref 20.0–24.0)
Patient temperature: 102.5
pCO2 arterial: 80.2 mmHg (ref 35.0–45.0)
pH, Arterial: 7.153 — CL (ref 7.350–7.450)

## 2012-05-26 LAB — GLUCOSE, CAPILLARY
Glucose-Capillary: 79 mg/dL (ref 70–99)
Glucose-Capillary: 101 mg/dL — ABNORMAL HIGH (ref 70–99)

## 2012-05-26 LAB — CBC
HCT: 24 % — ABNORMAL LOW (ref 36.0–46.0)
Hemoglobin: 6.6 g/dL — CL (ref 12.0–15.0)
Hemoglobin: 7.8 g/dL — ABNORMAL LOW (ref 12.0–15.0)
MCH: 19.1 pg — ABNORMAL LOW (ref 26.0–34.0)
MCH: 20.2 pg — ABNORMAL LOW (ref 26.0–34.0)
MCHC: 27.5 g/dL — ABNORMAL LOW (ref 30.0–36.0)
MCHC: 28.3 g/dL — ABNORMAL LOW (ref 30.0–36.0)
MCV: 69.7 fL — ABNORMAL LOW (ref 78.0–100.0)
MCV: 71.3 fL — ABNORMAL LOW (ref 78.0–100.0)
Platelets: 205 10*3/uL (ref 150–400)
Platelets: 199 10*3/uL (ref 150–400)
RBC: 3.45 MIL/uL — ABNORMAL LOW (ref 3.87–5.11)
RDW: 23.3 % — ABNORMAL HIGH (ref 11.5–15.5)

## 2012-05-26 LAB — BASIC METABOLIC PANEL
Chloride: 106 mEq/L (ref 96–112)
Creatinine, Ser: 2.47 mg/dL — ABNORMAL HIGH (ref 0.50–1.10)
GFR calc Af Amer: 23 mL/min — ABNORMAL LOW (ref >90)

## 2012-05-26 LAB — ABO/RH: ABO/RH(D): A POS

## 2012-05-26 MED ORDER — FENTANYL CITRATE 0.05 MG/ML IJ SOLN
INTRAMUSCULAR | Status: AC
  Filled 2012-05-26: qty 4

## 2012-05-26 MED ORDER — HEPARIN (PORCINE) IN NACL 100-0.45 UNIT/ML-% IJ SOLN
1350.0000 [IU]/h | INTRAMUSCULAR | Status: DC
  Administered 2012-05-26 – 2012-05-28 (×3): 1200 [IU]/h via INTRAVENOUS
  Administered 2012-05-29: 1350 [IU]/h via INTRAVENOUS
  Administered 2012-05-29: 1200 [IU]/h via INTRAVENOUS
  Administered 2012-05-30: 1350 [IU]/h via INTRAVENOUS
  Filled 2012-05-26 (×8): qty 250

## 2012-05-26 MED ORDER — FENTANYL CITRATE 0.05 MG/ML IJ SOLN
INTRAMUSCULAR | Status: AC | PRN
  Administered 2012-05-26 (×2): 25 ug via INTRAVENOUS

## 2012-05-26 MED ORDER — MIDAZOLAM HCL 2 MG/2ML IJ SOLN
INTRAMUSCULAR | Status: AC | PRN
  Administered 2012-05-26 (×2): 1 mg via INTRAVENOUS

## 2012-05-26 MED ORDER — ACETAMINOPHEN 160 MG/5ML PO SOLN
650.0000 mg | Freq: Four times a day (QID) | ORAL | Status: DC | PRN
  Administered 2012-05-26 – 2012-05-27 (×2): 650 mg
  Filled 2012-05-26 (×3): qty 20.3

## 2012-05-26 MED ORDER — MIDAZOLAM HCL 2 MG/2ML IJ SOLN
INTRAMUSCULAR | Status: AC
  Filled 2012-05-26: qty 4

## 2012-05-26 MED ORDER — DEXTROSE-NACL 5-0.45 % IV SOLN
INTRAVENOUS | Status: DC
  Administered 2012-05-26 – 2012-05-29 (×5): via INTRAVENOUS
  Administered 2012-05-30: 20 mL/h via INTRAVENOUS

## 2012-05-26 MED ORDER — OLOPATADINE HCL 0.1 % OP SOLN
1.0000 [drp] | Freq: Two times a day (BID) | OPHTHALMIC | Status: DC
  Administered 2012-05-26 – 2012-06-06 (×21): 1 [drp] via OPHTHALMIC
  Filled 2012-05-26 (×5): qty 5

## 2012-05-26 NOTE — Progress Notes (Signed)
ANTICOAGULATION CONSULT NOTE - Initial Consult  Pharmacy Consult:  Heparin Indication:  Right leg DVT  No Known Allergies  Patient Measurements: Height: 5' 4.5" (163.8 cm) Weight: 294 lb 1.5 oz (133.4 kg) IBW/kg (Calculated) : 55.85  Heparin Dosing Weight: 88 kg  Vital Signs: Temp: 99.8 F (37.7 C) (02/07 1500) Temp src: Core (Comment) (02/07 1500) BP: 104/50 mmHg (02/07 1728) Pulse Rate: 80  (02/07 1728)  Labs:  Basename 05/26/12 1205 05/26/12 0611 05/26/12 0500 05/25/12 0535 05/24/12 1211  HGB 7.8* 6.6* -- -- --  HCT 27.6* 24.1* 24.0* -- --  PLT 199 205 186 -- --  APTT -- -- -- -- --  LABPROT -- -- -- -- --  INR -- -- -- -- --  HEPARINUNFRC -- -- -- -- --  CREATININE -- -- 2.47* 1.81* 1.62*  CKTOTAL -- -- -- -- --  CKMB -- -- -- -- --  TROPONINI -- -- -- -- --    Estimated Creatinine Clearance: 31.6 ml/min (by C-G formula based on Cr of 2.47).   Assessment: 17 YOF previously on Lovenox 0.5 mg/kg/dose for VTE prophylaxis, dose adjusted for obesity.  Doppler today, 05/27/12, is positive for DVT and Pharmacy started heparin at 1225 today, drip was stopped at 1415 in preparation for IR/thoracentesis. Patient is back from procedure now and heparin drip is being resumed now, at 1200 units/hr as ordered this AM.  Goal of Therapy:  HL 0.3 - 0.7 units/mL Monitor platelets by anticoagulation protocol: Yes    Plan:  - Heparin gtt at 1200 units/hr, no bolus b/c patient s/p thoracentesis - d/c HL ordered for 1900, Check in 6hr - Daily HL / CBC - Monitor closely for bleeding  Thanks, Zayda Angell K. Allena Katz, PharmD, BCPS.  Clinical Pharmacist Pager [phone]. 05/26/2012 6:33 PM

## 2012-05-26 NOTE — Progress Notes (Signed)
INITIAL NUTRITION ASSESSMENT  DOCUMENTATION CODES Per approved criteria  -Morbid Obesity   INTERVENTION:  If unable to extubate within the next 24 hours, recommend initiate TF via OG tube with Osmolite 1.5 at 10 ml/h, increase by 10 ml every 4 hours to goal rate of 20 ml/h with Prostat 60 ml QID to provide 1520 kcals, 150 gm protein, 366 ml free water daily.  NUTRITION DIAGNOSIS: Inadequate oral intake related to inability to eat as evidenced by NPO status.   Goal: Enteral nutrition to provide 60-70% of estimated calorie needs (22-25 kcals/kg ideal body weight) and >/= 90% of estimated protein needs, based on ASPEN guidelines for permissive underfeeding in critically ill obese individuals.  Monitor:  TF initiation/tolerance/adequacy, labs, weight trend.  Reason for Assessment: VDRF  67 y.o. female  Admitting Dx: Pleural effusion, right  ASSESSMENT: Patient presented with a several week history of increasing shortness of breath and fatigue.  Developed respiratory insufficiency and required intubation 2/6.    Patient is currently intubated on ventilator support.  MV: 8.8 Temp:Temp (24hrs), Avg:101.3 F (38.5 C), Min:99.3 F (37.4 C), Max:102.9 F (39.4 C)   Height: Ht Readings from Last 1 Encounters:  05/24/12 5' 4.5" (1.638 m)    Weight: Wt Readings from Last 1 Encounters:  05/26/12 294 lb 1.5 oz (133.4 kg)    Ideal Body Weight: 55.7 kg  % Ideal Body Weight: 239%  Wt Readings from Last 10 Encounters:  05/26/12 294 lb 1.5 oz (133.4 kg)  05/19/12 289 lb 3 oz (131.175 kg)  05/18/12 294 lb (133.358 kg)  05/16/12 293 lb 9.6 oz (133.176 kg)  04/25/12 289 lb 6.4 oz (131.271 kg)  04/06/12 277 lb (125.646 kg)  04/05/12 271 lb 14.4 oz (123.333 kg)  03/22/12 294 lb 3.2 oz (133.448 kg)  03/14/12 284 lb 9.6 oz (129.094 kg)  03/10/12 282 lb 11.2 oz (128.232 kg)    Usual Body Weight: 271 lb (2 months ago)  % Usual Body Weight: 108%  BMI:  Body mass index is 49.70  kg/(m^2). class 3 extreme obesity  Estimated Nutritional Needs: Kcal: 2175 (permissive underfeeding goal: 0981-1914 kcals) Protein: >138 gm Fluid: 2.2 L  Skin: no problems noted  Diet Order: NPO  EDUCATION NEEDS: -Education not appropriate at this time   Intake/Output Summary (Last 24 hours) at 05/26/12 1001 Last data filed at 05/26/12 0925  Gross per 24 hour  Intake 663.72 ml  Output    585 ml  Net  78.72 ml    Last BM: 2/5   Labs:   Lab 05/26/12 0500 05/25/12 0535 05/24/12 1211  NA 140 140 141  K 5.1 5.1 5.5*  CL 106 106 106  CO2 27 27 25   BUN 35* 27* 25*  CREATININE 2.47* 1.81* 1.62*  CALCIUM 8.7 8.9 9.0  MG -- -- --  PHOS -- -- --  GLUCOSE 77 131* 116*    CBG (last 3)   Basename 05/26/12 0739 05/26/12 0252 05/25/12 2310  GLUCAP 70 118* 110*    Scheduled Meds:   . ipratropium  0.5 mg Nebulization Q6H   And  . albuterol  2.5 mg Nebulization Q6H  . antiseptic oral rinse  15 mL Mouth Rinse QID  . budesonide  0.5 mg Nebulization BID  . calcium-vitamin D  0.5 tablet Oral Daily  . chlorhexidine  15 mL Mouth Rinse BID  . dipyridamole-aspirin  1 capsule Oral BID  . enoxaparin (LOVENOX) injection  0.5 mg/kg Subcutaneous Q24H  . insulin aspart  0-20  Units Subcutaneous Q4H  . olopatadine  1 drop Both Eyes BID  . pantoprazole sodium  40 mg Per Tube Q24H  . piperacillin-tazobactam (ZOSYN)  IV  3.375 g Intravenous Q8H  . sodium chloride  3 mL Intravenous Q12H  . sodium chloride  3 mL Intravenous Q12H  . vancomycin  1,750 mg Intravenous Q48H    Continuous Infusions:   . sodium chloride 50 mL/hr at 05/26/12 0800  . fentaNYL infusion INTRAVENOUS Stopped (05/26/12 0800)  . midazolam (VERSED) infusion Stopped (05/26/12 0715)    Past Medical History  Diagnosis Date  . CHF (congestive heart failure)      2-D echo 02/19/2009 showed left ventricle cavity size normal; systolic function normal, estimated ejection fraction 55%; wall motion normal; no regional  wall motion abnormalities; PA peak pressure 47mm Hg.  [name] COPD (chronic obstructive pulmonary disease)     Pulmonary function tests on 08/05/2010 showed mixed obstructive and restrictive lung disease with no significant response to bronchodilator, and decreased diffusion capacity that corrects to normal range when adjusted for the inhaled alveolar volume.  . Degenerative joint disease   . AV block, complete     S/P dual-chamber for symptomatic episodes of bradycardia and complete heart block.  . Pacemaker     S/P dual-chamber for symptomatic episodes of bradycardia and complete heart block.  . Adenomatous polyp of colon     Tubular adenomas X 2 found 06/1999; negative screening colonoscopy 02/13/2004 by Dr. [name].  . Hypercholesteremia   . Obstructive sleep apnea     Baseline diagnostic nocturnal polysomnogram on July 27, 2005 showed severe obstructive sleep apnea/hypopnea syndrome, with AHI 153.2 per hour.  Last nocturnal polysomnogram done for CPAP titration was 05/01/2009; CPAP was titrated to 13 CWP, AHI 1.8 per hour using a large Resp[employer]ics FitLife Full Face Mask with heated humidifier.  . Peripheral autonomic neuropathy due to diabetes mellitus   . 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 [name]  . Constipation   . Hand pain   . Palpitation   . Rib pain     right sided   . DM type 2 (diabetes mellitus, type 2)   . Skin rash   . Urinary incontinence   . Proliferative diabetic retinopathy(362.02)     S/P panretinal photocoagulation by Dr. [name]  . Vitreous hemorrhage     With tractional detachment, left eye; S/P posterior vitrectomy with membrane peel by Dr. [name] 04/06/2005  . Dyspnea on exertion     Cardiac cath 06/17/2005 by Dr. [name] showed LVEF 50-55%, 30% proximal LAD stenosis, small diagonals.   . Shingles rash 03/25/11  . 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 Followed by Dr. [name].  . Breast cancer, stage 3 102/13    right hx lumpectomy 1994, bx today=invasive ductal ca  . Allergy     thinitis  . Asthma   . Bronchitis     hx  . Cataract     b/l surgery  . DM hyperosmolarity type II   . Hypertension   . COPD (chronic obstructive pulmonary disease)   .  Diastolic CHF   . TIA (transient ischemic attack)   . OSA (obstructive sleep apnea)   . Chronic respiratory failure with hypoxia   . Anemia   . DJD (degenerative joint disease)   . AV block   . DM retinopathy   . DM neuropathies   . Carcinoid tumor of lung   . Renal insufficiency     Past Surgical History  Procedure Date  . 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  Patient is followed by Dr. [name] at the cancer center.  . Lung lobectomy 06/13/2001    S/P right lower lobe lobectomy 06/13/2001 by Dr. [name]  . Pacemaker insertion 04/20/2001  . Mastectomy partial / lumpectomy   . Lobectomy   . Pacemaker insertion   . Cholecystectomy   . Tonsillectomy   . Port  a  cath     Joaquin Courts, RD, LDN, CNSC Pager# [phone] After Hours Pager# [phone]

## 2012-05-26 NOTE — Consult Note (Signed)
Reason for Consult:Right pleural effusion Referring Physician: Dr. Lance Bosch is an 67 y.o. female.  HPI: 67 yo female with multiple medical problems admitted 2/5 with cc/o SOB.  Ms. Saulnier is a morbidly obese woman with a history of COPD, OSA, pulmonary hypertension, type II DM, CHF. She also has had a right lower lobectomy for carcinoid in 2003. She had pneumonia in December with a small para-pneumonic effusion. She was treated with antibiotics. She is now readmitted with acute on chronic respiratory failure, which required intubation. CT of chest shows a loculated right pleural effusion- primarily lateral and apical. Ultrasound was unable to localize a significant fluid collection for thoracentesis.  Past Medical History  Diagnosis Date  . CHF (congestive heart failure)      2-D echo 02/19/2009 showed left ventricle cavity size normal; systolic function normal, estimated ejection fraction 55%; wall motion normal; no regional wall motion abnormalities; PA peak pressure 47mm Hg.  [name] COPD (chronic obstructive pulmonary disease)     Pulmonary function tests on 08/05/2010 showed mixed obstructive and restrictive lung disease with no significant response to bronchodilator, and decreased diffusion capacity that corrects to normal range when adjusted for the inhaled alveolar volume.  . Degenerative joint disease   . AV block, complete     S/P dual-chamber for symptomatic episodes of bradycardia and complete heart block.  . Pacemaker     S/P dual-chamber for symptomatic episodes of bradycardia and complete heart block.  . Adenomatous polyp of colon     Tubular adenomas X 2 found 06/1999; negative screening colonoscopy 02/13/2004 by Dr. [name].  . Hypercholesteremia   . Obstructive sleep apnea     Baseline diagnostic nocturnal polysomnogram on July 27, 2005 showed severe obstructive sleep apnea/hypopnea syndrome, with AHI 153.2 per hour.  Last nocturnal polysomnogram done for CPAP  titration was 05/01/2009; CPAP was titrated to 13 CWP, AHI 1.8 per hour using a large Respironics FitLife Full Face Mask with heated humidifier.  . Peripheral autonomic neuropathy due to diabetes mellitus   . 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 [name]  . Constipation   . Hand pain   . Palpitation   . Rib pain     right sided   . DM type 2 (diabetes mellitus, type 2)   . Skin rash   . Urinary incontinence   . Proliferative diabetic retinopathy(362.02)     S/P panretinal photocoagulation by Dr. [name]  . Vitreous hemorrhage     With tractional detachment, left eye; S/P posterior vitrectomy with membrane peel by Dr. [name] 04/06/2005  . Dyspnea on exertion     Cardiac cath 06/17/2005 by Dr. [name] showed LVEF 50-55%, 30% proximal LAD stenosis, small diagonals.   . Shingles rash 03/25/11  . 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 Followed by Dr. [name].  . Breast cancer, stage 3 102/13    right hx lumpectomy 1994, bx today=invasive ductal ca  . Allergy     thinitis  . Asthma   . Bronchitis     hx  . Cataract  b/l surgery  . DM hyperosmolarity type II   . Hypertension   . COPD (chronic obstructive pulmonary disease)   . Diastolic CHF   . TIA (transient ischemic attack)   . OSA (obstructive sleep apnea)   . Chronic respiratory failure with hypoxia   . Anemia   . DJD (degenerative joint disease)   . AV block   . DM retinopathy   . DM neuropathies   . Carcinoid tumor of lung   . Renal insufficiency     Past Surgical History  Procedure Date  . Mastectomy partial / lumpectomy  w/ axillary lymphadenectomy September 20, 1990, 09-19-1993    S/P left lumpectomy and axillary lymph node dissection December 1992 for a T1 N0 medullary breast cancer, no lymph node involvement, treated with tamoxifen for 2 years; S/P right lumpectomy and axillary lymph node dissection August 1995 for a T1 N0 microinvasive breast cancer, treated with Arimidex for 5 years.  Patient is followed by Dr. [name] at the cancer center.  . Lung lobectomy 06/13/2001    S/P right lower lobe lobectomy 06/13/2001 by Dr. [name]  . Pacemaker insertion 04/20/2001  . Mastectomy partial / lumpectomy   . Lobectomy   . Pacemaker insertion   . Cholecystectomy   . Tonsillectomy   . Port  a  cath     Family History  Problem Relation Age of Onset  . Breast cancer Mother 28    Deceased 09-20-02  . Diabetes type II Mother   . Heart attack Brother   . Cervical cancer Neg Hx   . Colon cancer Neg Hx   . Breast cancer Mother   . Diabetes Mellitus II Mother   . Hypertension Brother   . Coronary artery disease Brother   . Stroke Brother   . Diabetes type II Brother   . Hypertension Sister     Social History:  reports that she quit smoking about 11 years ago. Her smoking use included Cigarettes. She has a 15 pack-year smoking history. She has never used smokeless tobacco. She reports that she does not drink alcohol or use illicit drugs.  Allergies: No Known Allergies  Medications:  Scheduled:   . ipratropium  0.5 mg Nebulization Q6H   And  . albuterol  2.5 mg Nebulization Q6H  . antiseptic oral rinse  15 mL Mouth Rinse QID  . budesonide  0.5 mg Nebulization BID  . calcium-vitamin D  0.5 tablet Oral Daily  . chlorhexidine  15 mL Mouth Rinse BID  . insulin aspart  0-20 Units Subcutaneous Q4H  . olopatadine  1 drop Both Eyes BID  . pantoprazole sodium  40 mg Per Tube Q24H  . piperacillin-tazobactam (ZOSYN)  IV  3.375 g Intravenous Q8H  . vancomycin  1,750 mg Intravenous Q48H    Results for orders placed during the  hospital encounter of 05/24/12 (from the past 48 hour(s))  GLUCOSE, CAPILLARY     Status: Abnormal   Collection Time   05/24/12  4:42 PM      Component Value Range Comment   Glucose-Capillary 104 (*) 70 - 99 mg/dL   GLUCOSE, CAPILLARY     Status: Abnormal   Collection Time   05/24/12  8:54 PM      Component Value Range Comment   Glucose-Capillary 124 (*) 70 - 99 mg/dL   COMPREHENSIVE METABOLIC PANEL     Status: Abnormal   Collection Time   05/25/12  5:35 AM      Component Value Range Comment  Sodium 140  135 - 145 mEq/L    Potassium 5.1  3.5 - 5.1 mEq/L    Chloride 106  96 - 112 mEq/L    CO2 27  19 - 32 mEq/L    Glucose, Bld 131 (*) 70 - 99 mg/dL    BUN 27 (*) 6 - 23 mg/dL    Creatinine, Ser 9.60 (*) 0.50 - 1.10 mg/dL    Calcium 8.9  8.4 - 45.4 mg/dL    Total Protein 7.2  6.0 - 8.3 g/dL    Albumin 2.9 (*) 3.5 - 5.2 g/dL    AST 14  0 - 37 U/L    ALT 7  0 - 35 U/L    Alkaline Phosphatase 54  39 - 117 U/L    Total Bilirubin 0.1 (*) 0.3 - 1.2 mg/dL    GFR calc non Af Amer 28 (*) >90 mL/min    GFR calc Af Amer 33 (*) >90 mL/min   HEPATIC FUNCTION PANEL     Status: Abnormal   Collection Time   05/25/12  5:35 AM      Component Value Range Comment   Total Protein 7.2  6.0 - 8.3 g/dL    Albumin 2.8 (*) 3.5 - 5.2 g/dL    AST 15  0 - 37 U/L    ALT 8  0 - 35 U/L    Alkaline Phosphatase 51  39 - 117 U/L    Total Bilirubin 0.2 (*) 0.3 - 1.2 mg/dL    Bilirubin, Direct <0.9  0.0 - 0.3 mg/dL    Indirect Bilirubin NOT CALCULATED  0.3 - 0.9 mg/dL   LACTATE DEHYDROGENASE     Status: Abnormal   Collection Time   05/25/12  5:35 AM      Component Value Range Comment   LDH 292 (*) 94 - 250 U/L   GLUCOSE, CAPILLARY     Status: Abnormal   Collection Time   05/25/12  6:18 AM      Component Value Range Comment   Glucose-Capillary 116 (*) 70 - 99 mg/dL    Comment 1 Notify RN     GLUCOSE, CAPILLARY     Status: Abnormal   Collection Time   05/25/12 11:20 AM      Component Value Range Comment    Glucose-Capillary 159 (*) 70 - 99 mg/dL   BLOOD GAS, ARTERIAL     Status: Abnormal   Collection Time   05/25/12 12:20 PM      Component Value Range Comment   O2 Content 5.0      Delivery systems CONTINUOUS POSITIVE AIRWAY PRESSURE      Mode CONTINUOUS POSITIVE AIRWAY PRESSURE      Expiratory PAP 13      pH, Arterial 7.269 (*) 7.350 - 7.450    pCO2 arterial 59.8 (*) 35.0 - 45.0 mmHg    pO2, Arterial 65.1 (*) 80.0 - 100.0 mmHg    Bicarbonate 26.5 (*) 20.0 - 24.0 mEq/L    TCO2 28.3  0 - 100 mmol/L    Acid-Base Excess 0.3  0.0 - 2.0 mmol/L    O2 Saturation 90.0      Patient temperature 98.6      Collection site RIGHT RADIAL      Drawn by [id number]      Sample type ARTERIAL DRAW      Allens test (pass/fail) PASS  PASS   GLUCOSE, CAPILLARY     Status: Abnormal   Collection Time   05/25/12  4:36 PM      Component Value Range Comment   Glucose-Capillary 210 (*) 70 - 99 mg/dL   BLOOD GAS, ARTERIAL     Status: Abnormal   Collection Time   05/25/12  5:15 PM      Component Value Range Comment   FIO2 1.00      Delivery systems BILEVEL POSITIVE AIRWAY PRESSURE      Mode BILEVEL POSITIVE AIRWAY PRESSURE      Inspiratory PAP 16.0      Expiratory PAP 8.0      pH, Arterial 7.184 (*) 7.350 - 7.450    pCO2 arterial 72.6 (*) 35.0 - 45.0 mmHg    pO2, Arterial 210.0 (*) 80.0 - 100.0 mmHg    Bicarbonate 26.3 (*) 20.0 - 24.0 mEq/L    TCO2 28.5  0 - 100 mmol/L    Acid-base deficit 1.1  0.0 - 2.0 mmol/L    O2 Saturation 99.7      Patient temperature 98.6      Collection site LEFT RADIAL      Drawn by COLLECTED BY DOCTOR      Sample type ARTERIAL DRAW      Allens test (pass/fail) PASS  PASS   GLUCOSE, CAPILLARY     Status: Abnormal   Collection Time   05/25/12  5:46 PM      Component Value Range Comment   Glucose-Capillary 178 (*) 70 - 99 mg/dL   URINALYSIS, ROUTINE W REFLEX MICROSCOPIC     Status: Abnormal   Collection Time   05/25/12  6:10 PM      Component Value Range Comment   Color, Urine YELLOW   YELLOW    APPearance CLOUDY (*) CLEAR    Specific Gravity, Urine 1.018  1.005 - 1.030    pH 5.0  5.0 - 8.0    Glucose, UA NEGATIVE  NEGATIVE mg/dL    Hgb urine dipstick NEGATIVE  NEGATIVE    Bilirubin Urine NEGATIVE  NEGATIVE    Ketones, ur NEGATIVE  NEGATIVE mg/dL    Protein, ur 30 (*) NEGATIVE mg/dL    Urobilinogen, UA 0.2  0.0 - 1.0 mg/dL    Nitrite NEGATIVE  NEGATIVE    Leukocytes, UA NEGATIVE  NEGATIVE   URINE MICROSCOPIC-ADD ON     Status: Normal   Collection Time   05/25/12  6:10 PM      Component Value Range Comment   WBC, UA 0-2  <3 WBC/hpf    RBC / HPF 0-2  <3 RBC/hpf    Bacteria, UA RARE  RARE    Urine-Other AMORPHOUS URATES/PHOSPHATES     MRSA PCR SCREENING     Status: Normal   Collection Time   05/25/12  6:15 PM      Component Value Range Comment   MRSA by PCR NEGATIVE  NEGATIVE   LACTIC ACID, PLASMA     Status: Normal   Collection Time   05/25/12  7:30 PM      Component Value Range Comment   Lactic Acid, Venous 1.0  0.5 - 2.2 mmol/L   CORTISOL     Status: Normal   Collection Time   05/25/12  7:53 PM      Component Value Range Comment   Cortisol, Plasma 23.1     GLUCOSE, CAPILLARY     Status: Abnormal   Collection Time   05/25/12  8:02 PM      Component Value Range Comment   Glucose-Capillary 159 (*) 70 - 99 mg/dL  Comment 1 Notify RN     POCT I-STAT 3, BLOOD GAS (G3+)     Status: Abnormal   Collection Time   05/25/12  8:22 PM      Component Value Range Comment   pH, Arterial 7.153 (*) 7.350 - 7.450    pCO2 arterial 80.2 (*) 35.0 - 45.0 mmHg    pO2, Arterial 40.0 (*) 80.0 - 100.0 mmHg    Bicarbonate 27.3 (*) 20.0 - 24.0 mEq/L    TCO2 30  0 - 100 mmol/L    O2 Saturation 49.0      Acid-base deficit 2.0  0.0 - 2.0 mmol/L    Patient temperature 102.5 F      Collection site RADIAL, ALLEN'S TEST ACCEPTABLE      Drawn by RT      Sample type ARTERIAL      Comment NOTIFIED PHYSICIAN     POCT I-STAT 3, BLOOD GAS (G3+)     Status: Abnormal   Collection Time   05/25/12   8:53 PM      Component Value Range Comment   pH, Arterial 7.253 (*) 7.350 - 7.450    pCO2 arterial 64.5 (*) 35.0 - 45.0 mmHg    pO2, Arterial 257.0 (*) 80.0 - 100.0 mmHg    Bicarbonate 27.8 (*) 20.0 - 24.0 mEq/L    TCO2 30  0 - 100 mmol/L    O2 Saturation 100.0      Acid-Base Excess 1.0  0.0 - 2.0 mmol/L    Patient temperature 102.5 F      Collection site RADIAL, ALLEN'S TEST ACCEPTABLE      Drawn by RT      Sample type ARTERIAL      Comment NOTIFIED PHYSICIAN     GLUCOSE, CAPILLARY     Status: Abnormal   Collection Time   05/25/12 11:10 PM      Component Value Range Comment   Glucose-Capillary 110 (*) 70 - 99 mg/dL   BLOOD GAS, ARTERIAL     Status: Abnormal   Collection Time   05/25/12 11:59 PM      Component Value Range Comment   FIO2 40.00      Delivery systems VENTILATOR      Mode PRESSURE REGULATED VOLUME CONTROL      VT 480      Rate 18      Peep/cpap 5.0      pH, Arterial 7.308 (*) 7.350 - 7.450    pCO2 arterial 54.8 (*) 35.0 - 45.0 mmHg    pO2, Arterial 76.4 (*) 80.0 - 100.0 mmHg    Bicarbonate 25.8 (*) 20.0 - 24.0 mEq/L    TCO2 27.3  0 - 100 mmol/L    Acid-Base Excess 0.7  0.0 - 2.0 mmol/L    O2 Saturation 91.9      Patient temperature 102.8      Collection site LEFT RADIAL      Drawn by [id number]      Sample type ARTERIAL DRAW      Allens test (pass/fail) PASS  PASS   GLUCOSE, CAPILLARY     Status: Abnormal   Collection Time   05/26/12  2:52 AM      Component Value Range Comment   Glucose-Capillary 118 (*) 70 - 99 mg/dL   BLOOD GAS, ARTERIAL     Status: Abnormal   Collection Time   05/26/12  3:15 AM      Component Value Range Comment   FIO2 0.40  Delivery systems VENTILATOR      Mode PRESSURE REGULATED VOLUME CONTROL      VT 480      Rate 18      Peep/cpap 5.0      pH, Arterial 7.329 (*) 7.350 - 7.450    pCO2 arterial 51.0 (*) 35.0 - 45.0 mmHg    pO2, Arterial 90.0  80.0 - 100.0 mmHg    Bicarbonate 25.5 (*) 20.0 - 24.0 mEq/L    TCO2 27.0  0 - 100 mmol/L     Acid-Base Excess 0.6  0.0 - 2.0 mmol/L    O2 Saturation 97.4      Patient temperature 101.4      Collection site LEFT RADIAL      Drawn by [id number]      Sample type ARTERIAL      Allens test (pass/fail) PASS  PASS   BASIC METABOLIC PANEL     Status: Abnormal   Collection Time   05/26/12  5:00 AM      Component Value Range Comment   Sodium 140  135 - 145 mEq/L    Potassium 5.1  3.5 - 5.1 mEq/L    Chloride 106  96 - 112 mEq/L    CO2 27  19 - 32 mEq/L    Glucose, Bld 77  70 - 99 mg/dL    BUN 35 (*) 6 - 23 mg/dL    Creatinine, Ser 0.45 (*) 0.50 - 1.10 mg/dL    Calcium 8.7  8.4 - 40.9 mg/dL    GFR calc non Af Amer 20 (*) >90 mL/min    GFR calc Af Amer 23 (*) >90 mL/min   CBC     Status: Abnormal   Collection Time   05/26/12  5:00 AM      Component Value Range Comment   WBC 8.8  4.0 - 10.5 K/uL    RBC 3.45 (*) 3.87 - 5.11 MIL/uL    Hemoglobin 6.6 (*) 12.0 - 15.0 g/dL    HCT 81.1 (*) 91.4 - 46.0 %    MCV 69.6 (*) 78.0 - 100.0 fL    MCH 19.1 (*) 26.0 - 34.0 pg    MCHC 27.5 (*) 30.0 - 36.0 g/dL    RDW 78.2 (*) 95.6 - 15.5 %    Platelets 186  150 - 400 K/uL PLATELET COUNT CONFIRMED BY SMEAR  CBC     Status: Abnormal   Collection Time   05/26/12  6:11 AM      Component Value Range Comment   WBC 8.5  4.0 - 10.5 K/uL    RBC 3.46 (*) 3.87 - 5.11 MIL/uL    Hemoglobin 6.6 (*) 12.0 - 15.0 g/dL    HCT 21.3 (*) 08.6 - 46.0 %    MCV 69.7 (*) 78.0 - 100.0 fL    MCH 19.1 (*) 26.0 - 34.0 pg    MCHC 27.4 (*) 30.0 - 36.0 g/dL    RDW 57.8 (*) 46.9 - 15.5 %    Platelets 205  150 - 400 K/uL   PREPARE RBC (CROSSMATCH)     Status: Normal   Collection Time   05/26/12  7:30 AM      Component Value Range Comment   Order Confirmation ORDER PROCESSED BY BLOOD BANK     TYPE AND SCREEN     Status: Normal (Preliminary result)   Collection Time   05/26/12  7:30 AM      Component Value Range Comment   ABO/RH(D) A POS  Antibody Screen NEG      Sample Expiration 05/29/2012      Unit Number [id number]       Blood Component Type RED CELLS,LR      Unit division 00      Status of Unit ISSUED      Transfusion Status OK TO TRANSFUSE      Crossmatch Result Compatible      Unit Number [id number]      Blood Component Type RED CELLS,LR      Unit division 00      Status of Unit ALLOCATED      Transfusion Status OK TO TRANSFUSE      Crossmatch Result Compatible     ABO/RH     Status: Normal   Collection Time   05/26/12  7:30 AM      Component Value Range Comment   ABO/RH(D) A POS     GLUCOSE, CAPILLARY     Status: Normal   Collection Time   05/26/12  7:39 AM      Component Value Range Comment   Glucose-Capillary 70  70 - 99 mg/dL   GLUCOSE, CAPILLARY     Status: Normal   Collection Time   05/26/12 11:57 AM      Component Value Range Comment   Glucose-Capillary 79  70 - 99 mg/dL   CBC     Status: Abnormal (Preliminary result)   Collection Time   05/26/12 12:05 PM      Component Value Range Comment   WBC 9.4  4.0 - 10.5 K/uL    RBC 3.87  3.87 - 5.11 MIL/uL    Hemoglobin 7.8 (*) 12.0 - 15.0 g/dL    HCT 46.9 (*) 62.9 - 46.0 %    MCV 71.3 (*) 78.0 - 100.0 fL    MCH 20.2 (*) 26.0 - 34.0 pg    MCHC 28.3 (*) 30.0 - 36.0 g/dL    RDW 52.8 (*) 41.3 - 15.5 %    Platelets PENDING  150 - 400 K/uL     Ct Chest Wo Contrast  05/26/2012  *RADIOLOGY REPORT*  Clinical Data: Right-sided airspace disease and pleural effusion. History of breast cancer, right lower lobectomy, renal failure, right lung carcinoid.  CT CHEST WITHOUT CONTRAST  Technique:  Multidetector CT imaging of the chest was performed following the standard protocol without IV contrast.  Comparison: 04/01/2012  Findings: Diffuse cardiac enlargement.  Calcification in the coronary arteries, aorta, and aortic valve.  Pericardial effusion. Moderate sized right pleural effusion with diffuse atelectasis. Partial aeration of the right lower lung. Small left pleural effusion with basilar atelectasis.  Visualization of the lungs is limited due to  respiratory motion artifact and atelectasis. The pleural effusions and atelectatic changes have progressed since the previous study.  No underlying mass lesion is specifically identified, but masses could easily be obscured by the overlying pulmonary parenchymal processes and effusions.  Enteric endotracheal tubes are present.  Diffuse enlargement of the thyroid gland.  Surgical clips in the axillary regions bilaterally.  Old bilateral rib fractures possibly due to thoracotomy.  Cardiac pacemaker with generator pack in the right upper chest.  Limited visualization of the upper abdominal organs.  Diffuse degenerative changes throughout the thoracic spine.  Focal areas of vague sclerosis in the mid thoracic vertebrae are nonspecific but sclerotic metastases are not excluded.  IMPRESSION: Moderate sized right and small left pleural effusions with pulmonary atelectasis bilaterally.  Effusions have increased and aeration has decreased since previous studies.  Persistent  cardiac enlargement of pericardial effusion.  Nonspecific foci of sclerosis and mid thoracic vertebra.  Sclerotic metastasis is not excluded.   Original Report Authenticated By: Burman Nieves, M.D.    [place] Chest  05/25/2012  *RADIOLOGY REPORT*  Clinical Data: Shortness of breath, congestive heart failure, pulmonary edema, right-sided pleural effusion  CHEST ULTRASOUND  Comparison: None.  Findings: Limited ultrasound was performed of the right chest for the purposes of possible thoracentesis.  Ultrasound imaging reveals a small effusion, but this is not felt amendable to percutaneous thoracentesis.  No procedure performed  IMPRESSION: Limited ultrasound of chest finds small pleural effusion not amenable for thoracentesis  Read by Brayton El PA-C   Original Report Authenticated By: Richarda Overlie, M.D.    Dg Chest Port 1 View  05/26/2012  *RADIOLOGY REPORT*  Clinical Data: Right pleural effusion.  PORTABLE CHEST - 1 VIEW  Comparison: 05/25/2012  Findings:  Endotracheal tube, central venous catheter, and Port-A- Cath are in place.  Dual lead pacer in place.  There is increased pulmonary vascular congestion with slight left perihilar pulmonary edema.  Large loculated right pleural effusion has increased.  Most of the right lung is compressed.  IMPRESSION:  1.  New pulmonary vascular congestion on the left with new slight perihilar edema. 2.  Increased right effusion.   Original Report Authenticated By: Francene Boyers, M.D.    Dg Chest Port 1 View  05/25/2012  *RADIOLOGY REPORT*  Clinical Data: Endotracheal tube and central line placement  PORTABLE CHEST - 1 VIEW  Comparison: 05/24/2012; 05/18/2012; 05/16/2012  Findings:  Grossly unchanged enlarged cardiac silhouette and mediastinal contours.  Interval intubation with endotracheal tube overlying tracheal air column with tip regional to the level of the carina.  Interval placement of right jugular approach of the venous catheter with tip seen at least to the level of the mid SVC.  Otherwise, stable positioning of remaining support apparatus.  Interval increase in now moderate to large partially loculated right-sided pleural effusion and adjacent opacities within the right lung.  Pulmonary vasculature is indistinct.  Right axillary surgical clips.  Grossly unchanged bones.  IMPRESSION: 1.  Endotracheal tube overlies tracheal air column the tip regional to the carina.  Retraction approximately 2 cm is recommended. 2.  Right jugular approach central venous catheter tip projects over at least the mid SVC.  No pneumothorax. 3.  Interval increase in moderate to large likely loculated right- sided pleural effusion and adjacent opacities within the right lung, atelectasis versus infiltrate.  4.  Suspected worsening pulmonary edema.  This was made a call report.   Original Report Authenticated By: Tacey Ruiz, MD     Review of Systems  Unable to perform ROS: intubated   Blood pressure 108/42, pulse 82, temperature 99.2 F  (37.3 C), temperature source Core (Comment), resp. rate 18, height 5' 4.5" (1.638 m), weight 294 lb 1.5 oz (133.4 kg), SpO2 100.00%. Physical Exam  Vitals reviewed. Constitutional:       Morbidly obese, intubated  Neck: No tracheal deviation present.  Cardiovascular: Regular rhythm.   Murmur heard. Respiratory:       Rhonchi bilaterally  Musculoskeletal: She exhibits edema.  Lymphadenopathy:    She has no cervical adenopathy.  Neurological:       sedated  Skin: Skin is warm and dry.    Assessment/Plan: 67 yo woman with acute on chronic respiratory failure who has a loculated right pleural effusion. The question remains as to the character of the fluid (transudate, exudate, purulent?) and  also whether draining the fluid would facilitate weaning.   Ideally, a VATS would allow visualization of the pleural space to ensure all fluid was drained and the lung would reexpand. Even if that were possible I'm not sure it would make a significant difference in her respiratory function given that there is really a relatively small amount of fluid. The way the fluid is loculated makes the effusion appear larger than it really is. Unfortunately, she is not an operative candidate due to her severe co-morbidities.   D/w CCM and IR.  It would reasonable to attempt to place a pigtail catheter percutaneously under CT (or [place]) guidance if IR feels that it can be done safely(i.e. there's an adequate window). I think if the fluid can be accessed it would be wise to go ahead and leave a drain in there.   Lukasz Rogus C 05/26/2012, 1:26 PM

## 2012-05-26 NOTE — Progress Notes (Signed)
*  Preliminary Results* Bilateral lower extremity venous duplex completed. The right lower extremity is positive for deep vein thrombosis involving the femoral, popliteal, and posterior tibial veins. There is no obvious evidence of deep vein thrombosis involving the visualized veins of the left lower extremity. Preliminary results discussed with Aundra Millet, RN.  05/26/2012 11:00 AM Gertie Fey, RDMS, RDCS

## 2012-05-26 NOTE — Progress Notes (Signed)
PULMONARY  / CRITICAL CARE MEDICINE  Name: Melissa Villa MRN: [id number] DOB: 11-25-1945    ADMISSION DATE:  05/24/2012 CONSULTATION DATE:  05/25/2012  REFERRING MD :  Mariana Arn  CHIEF COMPLAINT:  Short or breath  BRIEF PATIENT DESCRIPTION:  67 yo female former smoker admitted with dry cough, dyspnea, and weakness.  Developed progressive hypoxic/hypercapnic respiratory failure, fever (Tm101.65F) and acute encephalopathy.  Transferred to ICU 2/06 and PCCM assumed medical care.   She was in hospital in December 2013 for PNA, Rt Empyema, AECOPD, and CHF exacerbation.  Significant PMHx of COPD on home oxygen, OSA/OHS, AV block s/p PM, Diastolic CHF, DM type II, Neuropathy, s/p RLLectomy for carcinoid, Breast cancer  SIGNIFICANT EVENTS: 2/05 Admit 2/06 Transfer to ICU, VDRF  STUDIES:  2/06 Echo >> EF 60 to 65%, grade 1 diastolic dysfx, PAS 104 mmHg 2/06 CT chest >> mod Rt effusion, small Lt effusion  LINES / TUBES: ETT 2/06 >> Rt IJ CVL 2/06 >>   CULTURES: Blood 2/06 >>  Sputum 2/06>>  ANTIBIOTICS: Vancomycin 2/06 >> Zosyn 2/06 >>   SUBJECTIVE:  PRBC transfusion ordered.  Tolerating some pressure support.  VITAL SIGNS: Temp:  [99.3 F (37.4 C)-102.9 F (39.4 C)] 99.7 F (37.6 C) (02/07 0800) Pulse Rate:  [54-85] 85  (02/07 0800) Resp:  [0-34] 10  (02/07 0800) BP: (92-155)/(38-76) 125/54 mmHg (02/07 0800) SpO2:  [88 %-100 %] 95 % (02/07 0800) FiO2 (%):  [40 %-100 %] 40 % (02/07 0800) Weight:  [294 lb 1.5 oz (133.4 kg)] 294 lb 1.5 oz (133.4 kg) (02/07 0242) HEMODYNAMICS: CVP:  [10 mmHg-23 mmHg] 23 mmHg VENTILATOR SETTINGS: Vent Mode:  [-] CPAP FiO2 (%):  [40 %-100 %] 40 % Set Rate:  [18 bmp] 18 bmp Vt Set:  [480 mL] 480 mL PEEP:  [5 cmH20] 5 cmH20 Pressure Support:  [16 cmH20] 16 cmH20 Plateau Pressure:  [20 cmH20-29 cmH20] 20 cmH20 INTAKE / OUTPUT: Intake/Output      02/06 0701 - 02/07 0700 02/07 0701 - 02/08 0700   P.O. 480    I.V. (mL/kg) 294.7 (2.2) 20  (0.1)   NG/GT  30   IV Piggyback 100    Total Intake(mL/kg) 874.7 (6.6) 50 (0.4)   Urine (mL/kg/hr) 1050 (0.3) 85   Total Output 1050 85   Net -175.3 -35          PHYSICAL EXAMINATION: General:  No distress Neuro:  Follows commands HEENT:  ETT in place Cardiovascular:  s1s2 regular, no murmur Lungs:  B/l rhonchi, no wheeze, decreased BS at Rt base Abdomen:  Obese, soft, decreased bowel sounds Musculoskeletal:  No edema Skin: post-XRT changes Rt chest  LABS:  Lab 05/26/12 0611 05/26/12 0500 05/26/12 0315 05/25/12 2359 05/25/12 2053 05/25/12 1930 05/25/12 0535 05/24/12 1211  HGB 6.6* 6.6* -- -- -- -- -- 7.5*  WBC 8.5 8.8 -- -- -- -- -- 9.5  PLT 205 186 -- -- -- -- -- 211  NA -- 140 -- -- -- -- 140 141  K -- 5.1 -- -- -- -- 5.1 --  CL -- 106 -- -- -- -- 106 106  CO2 -- 27 -- -- -- -- 27 25  GLUCOSE -- 77 -- -- -- -- 131* 116*  BUN -- 35* -- -- -- -- 27* 25*  CREATININE -- 2.47* -- -- -- -- 1.81* 1.62*  CALCIUM -- 8.7 -- -- -- -- 8.9 9.0  MG -- -- -- -- -- -- -- --  PHOS -- -- -- -- -- -- -- --  AST -- -- -- -- -- -- 1415 19  ALT -- -- -- -- -- -- 78 9  ALKPHOS -- -- -- -- -- -- 5451 51  BILITOT -- -- -- -- -- -- 0.1*0.2* 0.1*  PROT -- -- -- -- -- -- 7.27.2 7.2  ALBUMIN -- -- -- -- -- -- 2.9*2.8* 2.8*  APTT -- -- -- -- -- -- -- --  INR -- -- -- -- -- -- -- --  LATICACIDVEN -- -- -- -- -- 1.0 -- --  TROPONINI -- -- -- -- -- -- -- --  PROCALCITON -- -- -- -- -- -- -- --  PROBNP -- -- -- -- -- -- -- 442.0*  O2SATVEN -- -- -- -- -- -- -- --  PHART -- -- 7.329* 7.308* 7.253* -- -- --  PCO2ART -- -- 51.0* 54.8* 64.5* -- -- --  PO2ART -- -- 90.0 76.4* 257.0* -- -- --    Lab 05/26/12 0252 05/25/12 2310 05/25/12 2002 05/25/12 1746 05/25/12 1636  GLUCAP 118* 110* 159* 178* 210*    Imaging:  Dg Chest 2 View  05/24/2012  *RADIOLOGY REPORT*  Clinical Data: Shortness of breath.  CHEST - 2 VIEW  Comparison: 05/18/2012.  Findings: The power port is stable.  The pacer  wires are unchanged. The heart is enlarged but stable.  Mediastinal and hilar contours are prominent but unchanged.  There is a persistent moderate-sized right pleural effusion and underlying pulmonary edema.  No pneumothorax.  IMPRESSION: CHF with moderate sized right pleural effusion.   Original Report Authenticated By: Rudie Meyer, M.D.    Ct Chest Wo Contrast  05/26/2012  *RADIOLOGY REPORT*  Clinical Data: Right-sided airspace disease and pleural effusion. History of breast cancer, right lower lobectomy, renal failure, right lung carcinoid.  CT CHEST WITHOUT CONTRAST  Technique:  Multidetector CT imaging of the chest was performed following the standard protocol without IV contrast.  Comparison: 04/01/2012  Findings: Diffuse cardiac enlargement.  Calcification in the coronary arteries, aorta, and aortic valve.  Pericardial effusion. Moderate sized right pleural effusion with diffuse atelectasis. Partial aeration of the right lower lung. Small left pleural effusion with basilar atelectasis.  Visualization of the lungs is limited due to respiratory motion artifact and atelectasis. The pleural effusions and atelectatic changes have progressed since the previous study.  No underlying mass lesion is specifically identified, but masses could easily be obscured by the overlying pulmonary parenchymal processes and effusions.  Enteric endotracheal tubes are present.  Diffuse enlargement of the thyroid gland.  Surgical clips in the axillary regions bilaterally.  Old bilateral rib fractures possibly due to thoracotomy.  Cardiac pacemaker with generator pack in the right upper chest.  Limited visualization of the upper abdominal organs.  Diffuse degenerative changes throughout the thoracic spine.  Focal areas of vague sclerosis in the mid thoracic vertebrae are nonspecific but sclerotic metastases are not excluded.  IMPRESSION: Moderate sized right and small left pleural effusions with pulmonary atelectasis bilaterally.   Effusions have increased and aeration has decreased since previous studies.  Persistent cardiac enlargement of pericardial effusion.  Nonspecific foci of sclerosis and mid thoracic vertebra.  Sclerotic metastasis is not excluded.   Original Report Authenticated By: Burman Nieves, M.D.    [place] Chest  05/25/2012  *RADIOLOGY REPORT*  Clinical Data: Shortness of breath, congestive heart failure, pulmonary edema, right-sided pleural effusion  CHEST ULTRASOUND  Comparison: None.  Findings: Limited ultrasound was performed of the right chest for the purposes of possible thoracentesis.  Ultrasound imaging  reveals a small effusion, but this is not felt amendable to percutaneous thoracentesis.  No procedure performed  IMPRESSION: Limited ultrasound of chest finds small pleural effusion not amenable for thoracentesis  Read by Brayton El PA-C   Original Report Authenticated By: Richarda Overlie, M.D.    Dg Chest Port 1 View  05/26/2012  *RADIOLOGY REPORT*  Clinical Data: Right pleural effusion.  PORTABLE CHEST - 1 VIEW  Comparison: 05/25/2012  Findings: Endotracheal tube, central venous catheter, and Port-A- Cath are in place.  Dual lead pacer in place.  There is increased pulmonary vascular congestion with slight left perihilar pulmonary edema.  Large loculated right pleural effusion has increased.  Most of the right lung is compressed.  IMPRESSION:  1.  New pulmonary vascular congestion on the left with new slight perihilar edema. 2.  Increased right effusion.   Original Report Authenticated By: Francene Boyers, M.D.    Dg Chest Port 1 View  05/25/2012  *RADIOLOGY REPORT*  Clinical Data: Endotracheal tube and central line placement  PORTABLE CHEST - 1 VIEW  Comparison: 05/24/2012; 05/18/2012; 05/16/2012  Findings:  Grossly unchanged enlarged cardiac silhouette and mediastinal contours.  Interval intubation with endotracheal tube overlying tracheal air column with tip regional to the level of the carina.  Interval placement of  right jugular approach of the venous catheter with tip seen at least to the level of the mid SVC.  Otherwise, stable positioning of remaining support apparatus.  Interval increase in now moderate to large partially loculated right-sided pleural effusion and adjacent opacities within the right lung.  Pulmonary vasculature is indistinct.  Right axillary surgical clips.  Grossly unchanged bones.  IMPRESSION: 1.  Endotracheal tube overlies tracheal air column the tip regional to the carina.  Retraction approximately 2 cm is recommended. 2.  Right jugular approach central venous catheter tip projects over at least the mid SVC.  No pneumothorax. 3.  Interval increase in moderate to large likely loculated right- sided pleural effusion and adjacent opacities within the right lung, atelectasis versus infiltrate.  4.  Suspected worsening pulmonary edema.  This was made a call report.   Original Report Authenticated By: Tacey Ruiz, MD      ASSESSMENT / PLAN:  PULMONARY A: Acute on chronic respiratory failure 2nd to PNA and recurrent Rt pleural effusion. 2nd pulmonary hypertension. Hx of COPD. Hx of OSA/OHS. P:   Pressure support wean as tolerated >> not ready for extubation F/u CXR Scheduled BD's Consult TCTS to assess Rt pleural effusion >> ?VATS (poor surgical candidate) vs IR catheter placement   CARDIOVASCULAR A: Hx of CAD, HTN, Diastolic CHF. Hypotension after intubation 2/06 >> Resolved 2/07. P:  Monitor CVP Hold amlodipine, coreg, avapro, lipitor for now  Lovenox for DVT prophylaxis  RENAL A:  Acute on chronic renal failure >> likely from diuresis. Hyperkalemia. P:   Hold lasix F/u BMET Monitor urine outpt Continue IV fluids  GASTROINTESTINAL A:  Morbid obesity. Nutrition. P:   NPO while on vent May need tube feeds if unable to wean from vent soon >> hold until eval by TCTS Protonix for SUP  HEMATOLOGIC A:  Anemia of chronic disease and critical illness. PRBC transfusion  2/07.  No evidence for bleeding. Hx of Breast cancer. P:  F/u CBC Transfuse for Hb < 7  INFECTIOUS A:  HCAP. P:   D2/x vancomycin, zosyn F/u Cx   ENDOCRINE A:  DM type II with hyperglycemia. P:   SSI Hold lantus Hold tradjenta  NEUROLOGIC A:  Acute encephalopathy 2nd to hypercapnia. Improved 2/07. P:   Sedation protocol while on vent  SUMMARY:  Teaching service has discussed with pt's family.  They are agreeable to trial of intubation.  They would not want CPR/Defibrillation.  CC time 35 minutes.  Coralyn Helling, MD [facility] Pulmonary/Critical Care 05/26/2012, 8:35 AM Pager:  [phone] After 3pm call: [phone]

## 2012-05-26 NOTE — Significant Event (Signed)
DVT noted in Rt leg >> will have pharmacy start heparin gtt and d/c lovenox SQ.  Relatively low CBG.  Will add dextrose to IV fluid.  Coralyn Helling, MD [facility] Pulmonary/Critical Care 05/26/2012, 11:06 AM Pager:  [phone] After 3pm call: [phone]

## 2012-05-26 NOTE — Progress Notes (Signed)
ANTICOAGULATION CONSULT NOTE - Initial Consult  Pharmacy Consult:  Heparin Indication:  Right leg DVT  No Known Allergies  Patient Measurements: Height: 5' 4.5" (163.8 cm) Weight: 294 lb 1.5 oz (133.4 kg) IBW/kg (Calculated) : 55.85  Heparin Dosing Weight: 88 kg  Vital Signs: Temp: 99.2 F (37.3 C) (02/07 1035) Temp src: Core (Comment) (02/07 1035) BP: 108/42 mmHg (02/07 1119) Pulse Rate: 82  (02/07 1119)  Labs:  Basename 05/26/12 0611 05/26/12 0500 05/25/12 0535 05/24/12 1211  HGB 6.6* 6.6* -- --  HCT 24.1* 24.0* -- 27.6*  PLT 205 186 -- 211  APTT -- -- -- --  LABPROT -- -- -- --  INR -- -- -- --  HEPARINUNFRC -- -- -- --  CREATININE -- 2.47* 1.81* 1.62*  CKTOTAL -- -- -- --  CKMB -- -- -- --  TROPONINI -- -- -- --    Estimated Creatinine Clearance: 31.6 ml/min (by C-G formula based on Cr of 2.47).      Assessment: 60 YOF previously on Lovenox 0.5 mg/kg/dose for VTE prophylaxis, dose adjusted for obesity.  Doppler today, 05/27/12, is positive for DVT and Pharmacy asked to start IV heparin.  Noted patient with low hemoglobin and to get transfusion today.  Her platelets are normal.   Goal of Therapy:  HL 0.3 - 0.7 units/mL Monitor platelets by anticoagulation protocol: Yes    Plan:  - Heparin gtt at 1200 units/hr, no bolus b/c patient received Lovenox last night, her renal function is compromised, and her hemoglobin is low. - Check 6hr HL  - Daily HL / CBC - Monitor closely for bleeding    Krue Peterka D. Laney Potash, PharmD, BCPS Pager:  [phone] 05/26/2012, 11:46 AM

## 2012-05-26 NOTE — Progress Notes (Signed)
Anemia; no evidence of bleeding   Transfuse 1 U of PRBC

## 2012-05-26 NOTE — Progress Notes (Signed)
Patient ID: Melissa Villa, female   DOB: 1945/10/12, 67 y.o.   MRN: [id number] Request received for placement of right pleural drain in pt with acute on chronic respiratory failure, prior RLL lobectomy for carcinoid in 2003, COPD and recent CT revealing a small loculated primarily apico-lateral right pleural effusion. Pt also has RLE DVT and now on IV heparin.  Imaging studies reviewed by Dr. Grace Isaac and case d/w Drs. Sood/Hendrickson. Additional PMH as below. Exam: Pt intubated,sedated; chest- scatt bilat rhonchi; heart- RRR; abd- morbidly obese, soft, +BS.    Filed Vitals:   05/26/12 1000 05/26/12 1025 05/26/12 1035 05/26/12 1119  BP: 118/47 109/46 118/46 108/42  Pulse: 85 85 83 82  Temp: 99.2 F (37.3 C) 99.2 F (37.3 C) 99.2 F (37.3 C)   TempSrc: Core (Comment) Core (Comment) Core (Comment)   Resp: 3 3 22 18   Height:      Weight:      SpO2: 99%   100%   Past Medical History  Diagnosis Date  . CHF (congestive heart failure)      2-D echo 02/19/2009 showed left ventricle cavity size normal; systolic function normal, estimated ejection fraction 55%; wall motion normal; no regional wall motion abnormalities; PA peak pressure 47mm Hg.  [name] COPD (chronic obstructive pulmonary disease)     Pulmonary function tests on 08/05/2010 showed mixed obstructive and restrictive lung disease with no significant response to bronchodilator, and decreased diffusion capacity that corrects to normal range when adjusted for the inhaled alveolar volume.  . Degenerative joint disease   . AV block, complete     S/P dual-chamber for symptomatic episodes of bradycardia and complete heart block.  . Pacemaker     S/P dual-chamber for symptomatic episodes of bradycardia and complete heart block.  . Adenomatous polyp of colon     Tubular adenomas X 2 found 06/1999; negative screening colonoscopy 02/13/2004 by Dr. [name].  . Hypercholesteremia   . Obstructive sleep apnea     Baseline diagnostic nocturnal  polysomnogram on July 27, 2005 showed severe obstructive sleep apnea/hypopnea syndrome, with AHI 153.2 per hour.  Last nocturnal polysomnogram done for CPAP titration was 05/01/2009; CPAP was titrated to 13 CWP, AHI 1.8 per hour using a large Respironics FitLife Full Face Mask with heated humidifier.  . Peripheral autonomic neuropathy due to diabetes mellitus   . 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 [name]  . Constipation   . Hand pain   . Palpitation   . Rib pain     right sided   . DM type 2 (diabetes mellitus, type 2)   . Skin rash   . Urinary incontinence   . Proliferative diabetic retinopathy(362.02)     S/P panretinal photocoagulation by Dr. [name]  . Vitreous hemorrhage     With tractional detachment, left eye; S/P posterior vitrectomy with membrane peel by Dr. [name] 04/06/2005  . Dyspnea on exertion     Cardiac cath 06/17/2005 by Dr. [name] showed LVEF 50-55%, 30% proximal LAD stenosis, small diagonals.   . Shingles rash 03/25/11  . 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 Followed by Dr. [name].  . Breast cancer, stage 3 102/13    right hx lumpectomy 1994, bx today=invasive ductal ca  . Allergy     thinitis  . Asthma   . Bronchitis     hx  . Cataract     b/l surgery  . DM hyperosmolarity type II   . Hypertension   . COPD (chronic obstructive pulmonary disease)   . Diastolic CHF   . TIA (transient ischemic attack)   . OSA (obstructive sleep apnea)   . Chronic respiratory failure with hypoxia   . Anemia   . DJD (degenerative joint disease)   . AV block   . DM retinopathy    . DM neuropathies   . Carcinoid tumor of lung   . Renal insufficiency    Past Surgical History  Procedure Date  . 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  Patient is followed by Dr. [name] at the cancer center.  . Lung lobectomy 06/13/2001    S/P right lower lobe lobectomy 06/13/2001 by Dr. [name]  . Pacemaker insertion 04/20/2001  . Mastectomy partial / lumpectomy   . Lobectomy   . Pacemaker insertion   . Cholecystectomy   . Tonsillectomy   . Port  a  cath    Dg Chest 2 View  05/24/2012  *RADIOLOGY REPORT*  Clinical Data: Shortness of breath.  CHEST - 2 VIEW  Comparison: 05/18/2012.  Findings: The power port is stable.  The pacer wires are unchanged. The heart is enlarged but stable.  Mediastinal and hilar contours are prominent but unchanged.  There is a persistent moderate-sized right pleural effusion and underlying pulmonary edema.  No pneumothorax.  IMPRESSION: CHF with moderate sized right pleural effusion.   Original Report Authenticated By: Rudie Meyer, M.D.    Dg Chest 2 View  05/18/2012  *RADIOLOGY REPORT*  Clinical Data: Congestive heart failure with shortness of breath. COPD.  Controlled hypertension.  Diabetes and asthma.  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  CHEST - 2 VIEW  Comparison: 05/16/2012  Findings: A dual lead pacer is stable in position via a right subclavian approach.  A Port-A-Cath is in place via a left internal jugular approach and the tip is stable in the region of the superior cavoatrial junction.  Cardiomegaly is noted and appears unchanged in degree.  Right pleural fluid has increased slightly  since the previous exam. The pulmonary edema pattern has improved with pulmonary vascular congestion and mild residual edema identified.  Focal density at the right lung base likely represents a combination of atelectasis, pleural fluid and alveolar edema with a focal infiltrate not completely excluded.  Surgical clips are identified along the right lateral thoracic wall.  A healing rib fractures seen associated the lateral aspect of the right seventh rib.  IMPRESSION: Improving pulmonary edema pattern with residual pulmonary vascular congestion and mild edema suggested.  Increased right pleural effusion with associated right basilar atelectasis.   Original Report Authenticated By: Rhodia Albright, M.D.    Dg Chest 2 View  05/16/2012  *RADIOLOGY REPORT*  Clinical Data: Respiratory failure.  Shortness of breath.  Cough and wheezing.  COPD.  Congestive heart failure.  CHEST - 2 VIEW  Comparison: 04/06/2012  Findings: Dual lead pacer in place.  Power port tip in good position in the superior vena cava.  The patient has new bilateral pulmonary edema with a moderate right pleural effusion.  No acute osseous abnormality.  IMPRESSION: Moderate bilateral pulmonary edema with a moderate right pleural effusion.   Original Report Authenticated By: Francene Boyers, M.D.    Ct Chest Wo Contrast  05/26/2012  *RADIOLOGY REPORT*  Clinical Data: Right-sided airspace disease and pleural effusion. History of breast cancer, right lower lobectomy, renal failure, right lung carcinoid.  CT CHEST WITHOUT CONTRAST  Technique:  Multidetector CT imaging of the chest was performed following the standard protocol without IV contrast.  Comparison: 04/01/2012  Findings: Diffuse cardiac enlargement.  Calcification in the coronary arteries, aorta, and aortic valve.  Pericardial effusion. Moderate sized right pleural effusion with diffuse atelectasis. Partial aeration of the right lower lung. Small left pleural effusion with basilar atelectasis.   Visualization of the lungs is limited due to respiratory motion artifact and atelectasis. The pleural effusions and atelectatic changes have progressed since the previous study.  No underlying mass lesion is specifically identified, but masses could easily be obscured by the overlying pulmonary parenchymal processes and effusions.  Enteric endotracheal tubes are present.  Diffuse enlargement of the thyroid gland.  Surgical clips in the axillary regions bilaterally.  Old bilateral rib fractures possibly due to thoracotomy.  Cardiac pacemaker with generator pack in the right upper chest.  Limited visualization of the upper abdominal organs.  Diffuse degenerative changes throughout the thoracic spine.  Focal areas of vague sclerosis in the mid thoracic vertebrae are nonspecific but sclerotic metastases are not excluded.  IMPRESSION: Moderate sized right and small left pleural effusions with pulmonary atelectasis bilaterally.  Effusions have increased and aeration has decreased since previous studies.  Persistent cardiac enlargement of pericardial effusion.  Nonspecific foci of sclerosis and mid thoracic vertebra.  Sclerotic metastasis is not excluded.   Original Report Authenticated By: Burman Nieves, M.D.    [place] Chest  05/25/2012  *RADIOLOGY REPORT*  Clinical Data: Shortness of breath, congestive heart failure, pulmonary edema, right-sided pleural effusion  CHEST ULTRASOUND  Comparison: None.  Findings: Limited ultrasound was performed of the right chest for the purposes of possible thoracentesis.  Ultrasound imaging reveals a small effusion, but this is not felt amendable to percutaneous thoracentesis.  No procedure performed  IMPRESSION: Limited ultrasound of chest finds small pleural effusion not amenable for thoracentesis  Read by Brayton El PA-C   Original Report Authenticated By: Richarda Overlie, M.D.    Dg Chest Port 1 View  05/26/2012  *RADIOLOGY REPORT*  Clinical Data: Right pleural effusion.  PORTABLE  CHEST - 1 VIEW  Comparison: 05/25/2012  Findings: Endotracheal tube, central venous catheter, and Port-A- Cath are in place.  Dual lead pacer in place.  There is increased pulmonary vascular congestion with slight left perihilar pulmonary edema.  Large loculated right pleural effusion has increased.  Most of the right lung is compressed.  IMPRESSION:  1.  New pulmonary vascular congestion on the left with new slight perihilar edema. 2.  Increased right effusion.   Original Report Authenticated By: Francene Boyers, M.D.    Dg Chest Port 1 View  05/25/2012  *RADIOLOGY REPORT*  Clinical Data: Endotracheal tube and central line placement  PORTABLE CHEST - 1 VIEW  Comparison: 05/24/2012; 05/18/2012; 05/16/2012  Findings:  Grossly unchanged enlarged cardiac silhouette  and mediastinal contours.  Interval intubation with endotracheal tube overlying tracheal air column with tip regional to the level of the carina.  Interval placement of right jugular approach of the venous catheter with tip seen at least to the level of the mid SVC.  Otherwise, stable positioning of remaining support apparatus.  Interval increase in now moderate to large partially loculated right-sided pleural effusion and adjacent opacities within the right lung.  Pulmonary vasculature is indistinct.  Right axillary surgical clips.  Grossly unchanged bones.  IMPRESSION: 1.  Endotracheal tube overlies tracheal air column the tip regional to the carina.  Retraction approximately 2 cm is recommended. 2.  Right jugular approach central venous catheter tip projects over at least the mid SVC.  No pneumothorax. 3.  Interval increase in moderate to large likely loculated right- sided pleural effusion and adjacent opacities within the right lung, atelectasis versus infiltrate.  4.  Suspected worsening pulmonary edema.  This was made a call report.   Original Report Authenticated By: Tacey Ruiz, MD   Results for orders placed during the hospital encounter of  05/24/12  GLUCOSE, CAPILLARY      Component Value Range   Glucose-Capillary 104 (*) 70 - 99 mg/dL  COMPREHENSIVE METABOLIC PANEL      Component Value Range   Sodium 140  135 - 145 mEq/L   Potassium 5.1  3.5 - 5.1 mEq/L   Chloride 106  96 - 112 mEq/L   CO2 27  19 - 32 mEq/L   Glucose, Bld 131 (*) 70 - 99 mg/dL   BUN 27 (*) 6 - 23 mg/dL   Creatinine, Ser 1.61 (*) 0.50 - 1.10 mg/dL   Calcium 8.9  8.4 - 09.6 mg/dL   Total Protein 7.2  6.0 - 8.3 g/dL   Albumin 2.9 (*) 3.5 - 5.2 g/dL   AST 14  0 - 37 U/L   ALT 7  0 - 35 U/L   Alkaline Phosphatase 54  39 - 117 U/L   Total Bilirubin 0.1 (*) 0.3 - 1.2 mg/dL   GFR calc non Af Amer 28 (*) >90 mL/min   GFR calc Af Amer 33 (*) >90 mL/min  GLUCOSE, CAPILLARY      Component Value Range   Glucose-Capillary 124 (*) 70 - 99 mg/dL  BLOOD GAS, ARTERIAL      Component Value Range   O2 Content 5.0     Delivery systems CONTINUOUS POSITIVE AIRWAY PRESSURE     Mode CONTINUOUS POSITIVE AIRWAY PRESSURE     Expiratory PAP 13     pH, Arterial 7.269 (*) 7.350 - 7.450   pCO2 arterial 59.8 (*) 35.0 - 45.0 mmHg   pO2, Arterial 65.1 (*) 80.0 - 100.0 mmHg   Bicarbonate 26.5 (*) 20.0 - 24.0 mEq/L   TCO2 28.3  0 - 100 mmol/L   Acid-Base Excess 0.3  0.0 - 2.0 mmol/L   O2 Saturation 90.0     Patient temperature 98.6     Collection site RIGHT RADIAL     Drawn by [id number]     Sample type ARTERIAL DRAW     Allens test (pass/fail) PASS  PASS  GLUCOSE, CAPILLARY      Component Value Range   Glucose-Capillary 116 (*) 70 - 99 mg/dL   Comment 1 Notify RN    GLUCOSE, CAPILLARY      Component Value Range   Glucose-Capillary 159 (*) 70 - 99 mg/dL  HEPATIC FUNCTION PANEL      Component Value  Range   Total Protein 7.2  6.0 - 8.3 g/dL   Albumin 2.8 (*) 3.5 - 5.2 g/dL   AST 15  0 - 37 U/L   ALT 8  0 - 35 U/L   Alkaline Phosphatase 51  39 - 117 U/L   Total Bilirubin 0.2 (*) 0.3 - 1.2 mg/dL   Bilirubin, Direct <9.1  0.0 - 0.3 mg/dL   Indirect Bilirubin NOT  CALCULATED  0.3 - 0.9 mg/dL  LACTATE DEHYDROGENASE      Component Value Range   LDH 292 (*) 94 - 250 U/L  BLOOD GAS, ARTERIAL      Component Value Range   FIO2 1.00     Delivery systems BILEVEL POSITIVE AIRWAY PRESSURE     Mode BILEVEL POSITIVE AIRWAY PRESSURE     Inspiratory PAP 16.0     Expiratory PAP 8.0     pH, Arterial 7.184 (*) 7.350 - 7.450   pCO2 arterial 72.6 (*) 35.0 - 45.0 mmHg   pO2, Arterial 210.0 (*) 80.0 - 100.0 mmHg   Bicarbonate 26.3 (*) 20.0 - 24.0 mEq/L   TCO2 28.5  0 - 100 mmol/L   Acid-base deficit 1.1  0.0 - 2.0 mmol/L   O2 Saturation 99.7     Patient temperature 98.6     Collection site LEFT RADIAL     Drawn by COLLECTED BY DOCTOR     Sample type ARTERIAL DRAW     Allens test (pass/fail) PASS  PASS  GLUCOSE, CAPILLARY      Component Value Range   Glucose-Capillary 210 (*) 70 - 99 mg/dL  BASIC METABOLIC PANEL      Component Value Range   Sodium 140  135 - 145 mEq/L   Potassium 5.1  3.5 - 5.1 mEq/L   Chloride 106  96 - 112 mEq/L   CO2 27  19 - 32 mEq/L   Glucose, Bld 77  70 - 99 mg/dL   BUN 35 (*) 6 - 23 mg/dL   Creatinine, Ser 4.78 (*) 0.50 - 1.10 mg/dL   Calcium 8.7  8.4 - 29.5 mg/dL   GFR calc non Af Amer 20 (*) >90 mL/min   GFR calc Af Amer 23 (*) >90 mL/min  CBC      Component Value Range   WBC 8.8  4.0 - 10.5 K/uL   RBC 3.45 (*) 3.87 - 5.11 MIL/uL   Hemoglobin 6.6 (*) 12.0 - 15.0 g/dL   HCT 62.1 (*) 30.8 - 65.7 %   MCV 69.6 (*) 78.0 - 100.0 fL   MCH 19.1 (*) 26.0 - 34.0 pg   MCHC 27.5 (*) 30.0 - 36.0 g/dL   RDW 84.6 (*) 96.2 - 95.2 %   Platelets 186  150 - 400 K/uL  MRSA PCR SCREENING      Component Value Range   MRSA by PCR NEGATIVE  NEGATIVE  URINALYSIS, ROUTINE W REFLEX MICROSCOPIC      Component Value Range   Color, Urine YELLOW  YELLOW   APPearance CLOUDY (*) CLEAR   Specific Gravity, Urine 1.018  1.005 - 1.030   pH 5.0  5.0 - 8.0   Glucose, UA NEGATIVE  NEGATIVE mg/dL   Hgb urine dipstick NEGATIVE  NEGATIVE   Bilirubin Urine  NEGATIVE  NEGATIVE   Ketones, ur NEGATIVE  NEGATIVE mg/dL   Protein, ur 30 (*) NEGATIVE mg/dL   Urobilinogen, UA 0.2  0.0 - 1.0 mg/dL   Nitrite NEGATIVE  NEGATIVE   Leukocytes, UA NEGATIVE  NEGATIVE  BLOOD GAS, ARTERIAL      Component Value Range   FIO2 0.40     Delivery systems VENTILATOR     Mode PRESSURE REGULATED VOLUME CONTROL     VT 480     Rate 18     Peep/cpap 5.0     pH, Arterial 7.329 (*) 7.350 - 7.450   pCO2 arterial 51.0 (*) 35.0 - 45.0 mmHg   pO2, Arterial 90.0  80.0 - 100.0 mmHg   Bicarbonate 25.5 (*) 20.0 - 24.0 mEq/L   TCO2 27.0  0 - 100 mmol/L   Acid-Base Excess 0.6  0.0 - 2.0 mmol/L   O2 Saturation 97.4     Patient temperature 101.4     Collection site LEFT RADIAL     Drawn by [id number]     Sample type ARTERIAL     Allens test (pass/fail) PASS  PASS  URINE MICROSCOPIC-ADD ON      Component Value Range   WBC, UA 0-2  <3 WBC/hpf   RBC / HPF 0-2  <3 RBC/hpf   Bacteria, UA RARE  RARE   Urine-Other AMORPHOUS URATES/PHOSPHATES    LACTIC ACID, PLASMA      Component Value Range   Lactic Acid, Venous 1.0  0.5 - 2.2 mmol/L  CORTISOL      Component Value Range   Cortisol, Plasma 23.1    POCT I-STAT 3, BLOOD GAS (G3+)      Component Value Range   pH, Arterial 7.153 (*) 7.350 - 7.450   pCO2 arterial 80.2 (*) 35.0 - 45.0 mmHg   pO2, Arterial 40.0 (*) 80.0 - 100.0 mmHg   Bicarbonate 27.3 (*) 20.0 - 24.0 mEq/L   TCO2 30  0 - 100 mmol/L   O2 Saturation 49.0     Acid-base deficit 2.0  0.0 - 2.0 mmol/L   Patient temperature 102.5 F     Collection site RADIAL, ALLEN'S TEST ACCEPTABLE     Drawn by RT     Sample type ARTERIAL     Comment NOTIFIED PHYSICIAN    GLUCOSE, CAPILLARY      Component Value Range   Glucose-Capillary 159 (*) 70 - 99 mg/dL   Comment 1 Notify RN    POCT I-STAT 3, BLOOD GAS (G3+)      Component Value Range   pH, Arterial 7.253 (*) 7.350 - 7.450   pCO2 arterial 64.5 (*) 35.0 - 45.0 mmHg   pO2, Arterial 257.0 (*) 80.0 - 100.0 mmHg   Bicarbonate  27.8 (*) 20.0 - 24.0 mEq/L   TCO2 30  0 - 100 mmol/L   O2 Saturation 100.0     Acid-Base Excess 1.0  0.0 - 2.0 mmol/L   Patient temperature 102.5 F     Collection site RADIAL, ALLEN'S TEST ACCEPTABLE     Drawn by RT     Sample type ARTERIAL     Comment NOTIFIED PHYSICIAN    GLUCOSE, CAPILLARY      Component Value Range   Glucose-Capillary 110 (*) 70 - 99 mg/dL  BLOOD GAS, ARTERIAL      Component Value Range   FIO2 40.00     Delivery systems VENTILATOR     Mode PRESSURE REGULATED VOLUME CONTROL     VT 480     Rate 18     Peep/cpap 5.0     pH, Arterial 7.308 (*) 7.350 - 7.450   pCO2 arterial 54.8 (*) 35.0 - 45.0 mmHg   pO2, Arterial 76.4 (*) 80.0 -  100.0 mmHg   Bicarbonate 25.8 (*) 20.0 - 24.0 mEq/L   TCO2 27.3  0 - 100 mmol/L   Acid-Base Excess 0.7  0.0 - 2.0 mmol/L   O2 Saturation 91.9     Patient temperature 102.8     Collection site LEFT RADIAL     Drawn by [id number]     Sample type ARTERIAL DRAW     Allens test (pass/fail) PASS  PASS  GLUCOSE, CAPILLARY      Component Value Range   Glucose-Capillary 118 (*) 70 - 99 mg/dL  GLUCOSE, CAPILLARY      Component Value Range   Glucose-Capillary 178 (*) 70 - 99 mg/dL  CBC      Component Value Range   WBC 8.5  4.0 - 10.5 K/uL   RBC 3.46 (*) 3.87 - 5.11 MIL/uL   Hemoglobin 6.6 (*) 12.0 - 15.0 g/dL   HCT 16.1 (*) 09.6 - 04.5 %   MCV 69.7 (*) 78.0 - 100.0 fL   MCH 19.1 (*) 26.0 - 34.0 pg   MCHC 27.4 (*) 30.0 - 36.0 g/dL   RDW 40.9 (*) 81.1 - 91.4 %   Platelets 205  150 - 400 K/uL  PREPARE RBC (CROSSMATCH)      Component Value Range   Order Confirmation ORDER PROCESSED BY BLOOD BANK    TYPE AND SCREEN      Component Value Range   ABO/RH(D) A POS     Antibody Screen NEG     Sample Expiration 05/29/2012     Unit Number [id number]     Blood Component Type RED CELLS,LR     Unit division 00     Status of Unit ISSUED     Transfusion Status OK TO TRANSFUSE     Crossmatch Result Compatible     Unit Number [id number]      Blood Component Type RED CELLS,LR     Unit division 00     Status of Unit ALLOCATED     Transfusion Status OK TO TRANSFUSE     Crossmatch Result Compatible    ABO/RH      Component Value Range   ABO/RH(D) A POS    GLUCOSE, CAPILLARY      Component Value Range   Glucose-Capillary 70  70 - 99 mg/dL  CBC      Component Value Range   WBC 9.4  4.0 - 10.5 K/uL   RBC 3.87  3.87 - 5.11 MIL/uL   Hemoglobin 7.8 (*) 12.0 - 15.0 g/dL   HCT 32.4 (*) 40.1 - 02.7 %   MCV 71.3 (*) 78.0 - 100.0 fL   MCH 20.2 (*) 26.0 - 34.0 pg   MCHC 28.3 (*) 30.0 - 36.0 g/dL   RDW 25.3 (*) 66.4 - 40.3 %   Platelets 199  150 - 400 K/uL  GLUCOSE, CAPILLARY      Component Value Range   Glucose-Capillary 79  70 - 99 mg/dL   A/P: Pt with acute on chronic resp failure, and small loculated right apico-lateral pleural effusion. Plan is for CT guided attempt at right pleural drain placement today. Details/risks of procedure d/w pt's son, Melissa Villa, with his understanding and consent. Will hold IV heparin until after above procedure.

## 2012-05-26 NOTE — Procedures (Signed)
Technically successful CT guided drainage catheter placement into the right pleural space yielding 400 cc of simple serous appearing pleural fluid.  Samples sent to laboratory.  No immediate post procedural complications.

## 2012-05-27 ENCOUNTER — Inpatient Hospital Stay (HOSPITAL_COMMUNITY): Payer: Medicare Other

## 2012-05-27 DIAGNOSIS — J811 Chronic pulmonary edema: Secondary | ICD-10-CM

## 2012-05-27 DIAGNOSIS — J189 Pneumonia, unspecified organism: Secondary | ICD-10-CM | POA: Diagnosis not present

## 2012-05-27 DIAGNOSIS — I82401 Acute embolism and thrombosis of unspecified deep veins of right lower extremity: Secondary | ICD-10-CM | POA: Diagnosis not present

## 2012-05-27 LAB — CBC
Hemoglobin: 7.4 g/dL — ABNORMAL LOW (ref 12.0–15.0)
MCH: 20.3 pg — ABNORMAL LOW (ref 26.0–34.0)
Platelets: 186 10*3/uL (ref 150–400)
RBC: 3.65 MIL/uL — ABNORMAL LOW (ref 3.87–5.11)
WBC: 9.5 10*3/uL (ref 4.0–10.5)

## 2012-05-27 LAB — URINE CULTURE
Colony Count: NO GROWTH
Culture: NO GROWTH
Special Requests: NORMAL

## 2012-05-27 LAB — GLUCOSE, CAPILLARY
Glucose-Capillary: 100 mg/dL — ABNORMAL HIGH (ref 70–99)
Glucose-Capillary: 113 mg/dL — ABNORMAL HIGH (ref 70–99)

## 2012-05-27 LAB — BASIC METABOLIC PANEL
Calcium: 8.6 mg/dL (ref 8.4–10.5)
GFR calc non Af Amer: 27 mL/min — ABNORMAL LOW (ref >90)
Glucose, Bld: 90 mg/dL (ref 70–99)
Potassium: 4.7 mEq/L (ref 3.5–5.1)
Sodium: 141 mEq/L (ref 135–145)

## 2012-05-27 LAB — TROPONIN I: Troponin I: <0.3 ng/mL (ref <0.30)

## 2012-05-27 LAB — HEPARIN LEVEL (UNFRACTIONATED)
Heparin Unfractionated: 0.34 IU/mL (ref 0.30–0.70)
Heparin Unfractionated: 0.43 IU/mL (ref 0.30–0.70)

## 2012-05-27 MED ORDER — MORPHINE SULFATE 2 MG/ML IJ SOLN
INTRAMUSCULAR | Status: AC
  Administered 2012-05-27: 2 mg via INTRAVENOUS
  Filled 2012-05-27: qty 1

## 2012-05-27 MED ORDER — VANCOMYCIN HCL 10 G IV SOLR
1750.0000 mg | INTRAVENOUS | Status: DC
  Administered 2012-05-27: 1750 mg via INTRAVENOUS
  Filled 2012-05-27: qty 1750

## 2012-05-27 MED ORDER — FENTANYL BOLUS VIA INFUSION
25.0000 ug | INTRAVENOUS | Status: DC | PRN
  Filled 2012-05-27: qty 100

## 2012-05-27 MED ORDER — MORPHINE SULFATE 2 MG/ML IJ SOLN
2.0000 mg | INTRAMUSCULAR | Status: DC | PRN
  Administered 2012-05-28 – 2012-05-30 (×7): 2 mg via INTRAVENOUS
  Filled 2012-05-27 (×8): qty 1

## 2012-05-27 MED ORDER — FUROSEMIDE 10 MG/ML IJ SOLN
40.0000 mg | Freq: Once | INTRAMUSCULAR | Status: AC
  Administered 2012-05-27: 40 mg via INTRAVENOUS
  Filled 2012-05-27: qty 4

## 2012-05-27 NOTE — Progress Notes (Signed)
Subjective: Rt chest tube placement into effusion 2/7 Pt on vent Alert No pain Draining well  Objective: Vital signs in last 24 hours: Temp:  [99.2 F (37.3 C)-100.7 F (38.2 C)] 100.5 F (38.1 C) (02/08 0500) Pulse Rate:  [75-110] 83 (02/08 0500) Resp:  [0-22] 18 (02/08 0500) BP: (104-131)/(42-73) 116/50 mmHg (02/08 0500) SpO2:  [93 %-100 %] 98 % (02/08 0803) FiO2 (%):  [40 %] 40 % (02/08 0817) Weight:  [295 lb 3.1 oz (133.9 kg)] 295 lb 3.1 oz (133.9 kg) (02/08 0500) Last BM Date: 05/24/12  Intake/Output from previous day: 02/07 0701 - 02/08 0700 In: 1961.6 [I.V.:1232.1; Blood:349.5; NG/GT:280; IV Piggyback:100] Out: 1430 [Urine:1180; Chest Tube:250] Intake/Output this shift:    PE:  T max 100.7; 100.5 now Chest tube intact Output 30 cc in pleur vac Yellow/serous fluid Site clean and dry No air leak   Lab Results:   Recent Labs  05/26/12 1205 05/27/12 0423  WBC 9.4 9.5  HGB 7.8* 7.4*  HCT 27.6* 25.7*  PLT 199 186   BMET  Recent Labs  05/26/12 0500 05/27/12 0423  NA 140 141  K 5.1 4.7  CL 106 107  CO2 27 27  GLUCOSE 77 90  BUN 35* 29*  CREATININE 2.47* 1.87*  CALCIUM 8.7 8.6   PT/INR No results found for this basename: LABPROT, INR,  in the last 72 hours ABG  Recent Labs  05/25/12 2359 05/26/12 0315  PHART 7.308* 7.329*  HCO3 25.8* 25.5*    Studies/Results: Ct Chest Wo Contrast  05/26/2012  *RADIOLOGY REPORT*  Clinical Data: Right-sided airspace disease and pleural effusion. History of breast cancer, right lower lobectomy, renal failure, right lung carcinoid.  CT CHEST WITHOUT CONTRAST  Technique:  Multidetector CT imaging of the chest was performed following the standard protocol without IV contrast.  Comparison: 04/01/2012  Findings: Diffuse cardiac enlargement.  Calcification in the coronary arteries, aorta, and aortic valve.  Pericardial effusion. Moderate sized right pleural effusion with diffuse atelectasis. Partial aeration of the  right lower lung. Small left pleural effusion with basilar atelectasis.  Visualization of the lungs is limited due to respiratory motion artifact and atelectasis. The pleural effusions and atelectatic changes have progressed since the previous study.  No underlying mass lesion is specifically identified, but masses could easily be obscured by the overlying pulmonary parenchymal processes and effusions.  Enteric endotracheal tubes are present.  Diffuse enlargement of the thyroid gland.  Surgical clips in the axillary regions bilaterally.  Old bilateral rib fractures possibly due to thoracotomy.  Cardiac pacemaker with generator pack in the right upper chest.  Limited visualization of the upper abdominal organs.  Diffuse degenerative changes throughout the thoracic spine.  Focal areas of vague sclerosis in the mid thoracic vertebrae are nonspecific but sclerotic metastases are not excluded.  IMPRESSION: Moderate sized right and small left pleural effusions with pulmonary atelectasis bilaterally.  Effusions have increased and aeration has decreased since previous studies.  Persistent cardiac enlargement of pericardial effusion.  Nonspecific foci of sclerosis and mid thoracic vertebra.  Sclerotic metastasis is not excluded.   Original Report Authenticated By: Burman Nieves, M.D.    [place] Chest  05/25/2012  *RADIOLOGY REPORT*  Clinical Data: Shortness of breath, congestive heart failure, pulmonary edema, right-sided pleural effusion  CHEST ULTRASOUND  Comparison: None.  Findings: Limited ultrasound was performed of the right chest for the purposes of possible thoracentesis.  Ultrasound imaging reveals a small effusion, but this is not felt amendable to percutaneous thoracentesis.  No  procedure performed  IMPRESSION: Limited ultrasound of chest finds small pleural effusion not amenable for thoracentesis  Read by Brayton El PA-C   Original Report Authenticated By: Richarda Overlie, M.D.    Dg Chest Port 1 View  05/27/2012   *RADIOLOGY REPORT*  Clinical Data: 67 year old female pneumonia pleural effusion.  PORTABLE CHEST - 1 VIEW  Comparison: Chest CT 05/26/2012 and earlier.  Findings: Semi upright AP view at 0605 hours.  Pigtail right pleural drain now in place.  Interval decreased right pleural effusion and mild to moderately improved right lung ventilation.  Stable endotracheal tube, visualized enteric tube and left chest Port-A-Cath.  Stable right chest cardiac pacemaker.  Stable cardiac size and mediastinal contours.  Decreased vascular congestion.  No confluent opacity in the left lung.  No pneumothorax identified.  IMPRESSION: 1.  Right pleural drain placed with decreased pleural effusion and mild to moderately improved right lung ventilation.  Residual fluid with no pneumothorax identified. 2.  Otherwise stable lines and tubes. 3.  Decreased pulmonary vascular congestion.   Original Report Authenticated By: Erskine Speed, M.D.    Dg Chest Port 1 View  05/26/2012  *RADIOLOGY REPORT*  Clinical Data: Right pleural effusion.  PORTABLE CHEST - 1 VIEW  Comparison: 05/25/2012  Findings: Endotracheal tube, central venous catheter, and Port-A- Cath are in place.  Dual lead pacer in place.  There is increased pulmonary vascular congestion with slight left perihilar pulmonary edema.  Large loculated right pleural effusion has increased.  Most of the right lung is compressed.  IMPRESSION:  1.  New pulmonary vascular congestion on the left with new slight perihilar edema. 2.  Increased right effusion.   Original Report Authenticated By: Francene Boyers, M.D.    Dg Chest Port 1 View  05/25/2012  *RADIOLOGY REPORT*  Clinical Data: Endotracheal tube and central line placement  PORTABLE CHEST - 1 VIEW  Comparison: 05/24/2012; 05/18/2012; 05/16/2012  Findings:  Grossly unchanged enlarged cardiac silhouette and mediastinal contours.  Interval intubation with endotracheal tube overlying tracheal air column with tip regional to the level of the  carina.  Interval placement of right jugular approach of the venous catheter with tip seen at least to the level of the mid SVC.  Otherwise, stable positioning of remaining support apparatus.  Interval increase in now moderate to large partially loculated right-sided pleural effusion and adjacent opacities within the right lung.  Pulmonary vasculature is indistinct.  Right axillary surgical clips.  Grossly unchanged bones.  IMPRESSION: 1.  Endotracheal tube overlies tracheal air column the tip regional to the carina.  Retraction approximately 2 cm is recommended. 2.  Right jugular approach central venous catheter tip projects over at least the mid SVC.  No pneumothorax. 3.  Interval increase in moderate to large likely loculated right- sided pleural effusion and adjacent opacities within the right lung, atelectasis versus infiltrate.  4.  Suspected worsening pulmonary edema.  This was made a call report.   Original Report Authenticated By: Tacey Ruiz, MD    Ct Perc Pleural Drain W/indwell Cath W/img Guide  05/26/2012  *RADIOLOGY REPORT*  Indication: Increasing right-sided pleural effusion, inability to wean from ventilator  CT GUIDED RIGHT SIDED PLEURAL DRAINAGE CATHETER PLACEMENT  Comparison: Chest CT - 05/25/2012; chest radiograph - 05/26/2012; 05/25/2012; 05/24/2012; chest ultrasound - 05/25/2012  Medications: Fentanyl 50 mcg IV; Versed 2 mg IV  Total Moderate Sedation time: 24 minutes  Contrast: None  Complications: None immediate  Technique / Findings:  Informed written consent was obtained from  the patient's family after a discussion of the risks, benefits and alternatives to treatment.  The patient was placed supine on the CT gantry and a pre procedural CT was performed re-demonstrating the known small to moderate-sized right-sided pleural effusion.  The procedure was planned.   A timeout was performed prior to the initiation of the procedure.  The right anterior chest was prepped and draped in the usual  sterile fashion.   The overlying soft tissues were anesthetized with 1% lidocaine with epinephrine.  Appropriate trajectory was planned with the use of a 22 gauge spinal needle.  An 18 gauge trocar needle was advanced into the right pleural space a short Amplatz super stiff wire was coiled within the collection. Appropriate positioning was confirmed with a limited CT scan.  The tract was serially dilated allowing placement of a 10 [place] all- purpose drainage catheter.  Appropriate positioning was confirmed with a limited postprocedural CT scan with the drainage catheter directed towards the right lung apex.  Approximately 400 ml of simple, serous fluid was aspirated from the right pleural space.  The tube was connected to a Pleur-vac and sutured in place.  A dressing was placed.  The patient tolerated the procedure well without immediate post procedural complication.  Impression:  Successful CT guided placement of a 10 [place] all purpose drain catheter into the right pleural space with aspiration of 400 mL of serious, simple appearing pleural fluid.  Samples were sent to the laboratory as requested by the ordering clinical team.   Original Report Authenticated By: Tacey Ruiz, MD     Anti-infectives: Anti-infectives   Start     Dose/Rate Route Frequency Ordered Stop   05/27/12 2000  vancomycin (VANCOCIN) 1,750 mg in sodium chloride 0.9 % 500 mL IVPB     1,750 mg 250 mL/hr over 120 Minutes Intravenous Every 48 hours 05/25/12 1858     05/25/12 2200  piperacillin-tazobactam (ZOSYN) IVPB 3.375 g     3.375 g 12.5 mL/hr over 240 Minutes Intravenous 3 times per day 05/25/12 1858     05/25/12 2000  vancomycin (VANCOCIN) 2,500 mg in sodium chloride 0.9 % 500 mL IVPB     2,500 mg 250 mL/hr over 120 Minutes Intravenous  Once 05/25/12 1858 05/25/12 2200      Assessment/Plan: s/p  Rt chest tube placed 2/7 Pt seems better Draining well  will follow   LOS: 3 days    Stonewall Doss A 05/27/2012

## 2012-05-27 NOTE — Progress Notes (Signed)
ANTICOAGULATION CONSULT NOTE Pharmacy Consult:  Heparin Indication:  Right leg DVT  No Known Allergies  Patient Measurements: Height: 5' 4.5" (163.8 cm) Weight: 295 lb 3.1 oz (133.9 kg) IBW/kg (Calculated) : 55.85 Heparin Dosing Weight: 88 kg  Vital Signs: Temp: 100.5 F (38.1 C) (02/08 0500) Temp src: Core (Comment) (02/08 0000) BP: 116/50 mmHg (02/08 0500) Pulse Rate: 83 (02/08 0500)  Labs:  Recent Labs  05/25/12 0535 05/26/12 0500 05/26/12 0611 05/26/12 1205 05/27/12 05/27/12 0423  HGB  --  6.6* 6.6* 7.8*  --  7.4*  HCT  --  24.0* 24.1* 27.6*  --  25.7*  PLT  --  186 205 199  --  PENDING  HEPARINUNFRC  --   --   --   --  0.34 0.43  CREATININE 1.81* 2.47*  --   --   --  1.87*    Estimated Creatinine Clearance: 41.8 ml/min (by C-G formula based on Cr of 1.87).   Assessment: 67 YO Female with DVT for heparin.   Goal of Therapy:  HL 0.3 - 0.7 units/mL Monitor platelets by anticoagulation protocol: Yes    Plan:  -Continue Heparin at current rate   Geannie Risen, PharmD, BCPS 05/27/2012 5:41 AM

## 2012-05-27 NOTE — Progress Notes (Signed)
PULMONARY  / CRITICAL CARE MEDICINE  Name: Melissa Villa MRN: [id number] DOB: 24-Feb-1946    ADMISSION DATE:  05/24/2012 CONSULTATION DATE:  05/25/2012  REFERRING MD :  Mariana Arn  CHIEF COMPLAINT:  Short or breath  BRIEF PATIENT DESCRIPTION:  67 y/o female former smoker admitted with dry cough, dyspnea, and weakness.  Developed progressive hypoxic/hypercapnic respiratory failure, fever (Tm101.47F) and acute encephalopathy.  Transferred to ICU 2/06 and PCCM assumed medical care.   She was in hospital in December 2013 for PNA, Rt Empyema, AECOPD, and CHF exacerbation.  Significant PMHx of COPD on home oxygen, OSA/OHS, AV block s/p PM, Diastolic CHF, DM type II, Neuropathy, s/p RLLectomy for carcinoid, Breast cancer  SIGNIFICANT EVENTS: 2/05 -  Admit 2/06 - Transfer to ICU, VDRF 2/08 - Tolerating PS wean 5/5 40%   STUDIES:  2/06 Echo >> EF 60 to 65%, grade 1 diastolic dysfx, PAS 104 mmHg 2/06 CT chest >> mod Rt effusion, small Lt effusion  LINES / TUBES: ETT 2/06 >> Rt IJ CVL 2/06 >>  L Chest Port >>>  CULTURES: Blood 2/06 >>  Sputum 2/06>> UC 2/6>>>neg Rt pleural fluid 2/7>>  ANTIBIOTICS: Vancomycin 2/06 >> Zosyn 2/06 >>   SUBJECTIVE:  Tolerating PSV wean 5/5 40%, awake, alert.  Tmax 100.6  VITAL SIGNS: Temp:  [99.2 F (37.3 C)-100.7 F (38.2 C)] 100.3 F (37.9 C) (02/08 0900) Pulse Rate:  [75-110] 93 (02/08 0900) Resp:  [0-27] 27 (02/08 0900) BP: (104-131)/(42-73) 125/51 mmHg (02/08 0900) SpO2:  [93 %-100 %] 98 % (02/08 0900) FiO2 (%):  [40 %] 40 % (02/08 0817) Weight:  [295 lb 3.1 oz (133.9 kg)] 295 lb 3.1 oz (133.9 kg) (02/08 0500)  HEMODYNAMICS: CVP:  [7 mmHg-29 mmHg] 10 mmHg  VENTILATOR SETTINGS: Vent Mode:  [-] PRVC FiO2 (%):  [40 %] 40 % Set Rate:  [18 bmp] 18 bmp Vt Set:  [480 mL] 480 mL PEEP:  [5 cmH20] 5 cmH20 Pressure Support:  [5 cmH20-16 cmH20] 5 cmH20 Plateau Pressure:  [19 cmH20-22 cmH20] 20 cmH20  INTAKE / OUTPUT: Intake/Output      02/07 0701 - 02/08 0700 02/08 0701 - 02/09 0700   P.O.     I.V. (mL/kg) 1292.1 (9.6) 132 (1)   Blood 349.5    NG/GT 280    IV Piggyback 100    Total Intake(mL/kg) 2021.6 (15.1) 132 (1)   Urine (mL/kg/hr) 1180 (0.4) 150 (0.3)   Chest Tube 250 (0.1)    Total Output 1430 150   Net +591.6 -18          PHYSICAL EXAMINATION: General:  No distress Neuro:  Follows commands, awake, alert HEENT:  ETT in place, mm pink/moist Cardiovascular:  s1s2 regular, no murmur Lungs:  B/l rhonchi, no wheeze, decreased BS at Rt base, R CT x1 Abdomen:  Obese, soft, decreased bowel sounds Musculoskeletal:  No edema Skin: post-XRT changes Rt chest  LABS:  Recent Labs Lab 05/24/12 1211 05/25/12 0535  05/25/12 1930  05/25/12 2053 05/25/12 2359 05/26/12 0315 05/26/12 0500 05/26/12 0611 05/26/12 1205 05/27/12 0423  HGB 7.5*  --   --   --   --   --   --   --  6.6* 6.6* 7.8* 7.4*  WBC 9.5  --   --   --   --   --   --   --  8.8 8.5 9.4 9.5  PLT 211  --   --   --   --   --   --   --  186 205 199 186  NA 141 140  --   --   --   --   --   --  140  --   --  141  K 5.5* 5.1  --   --   --   --   --   --  5.1  --   --  4.7  CL 106 106  --   --   --   --   --   --  106  --   --  107  CO2 25 27  --   --   --   --   --   --  27  --   --  27  GLUCOSE 116* 131*  --   --   --   --   --   --  77  --   --  90  BUN 25* 27*  --   --   --   --   --   --  35*  --   --  29*  CREATININE 1.62* 1.81*  --   --   --   --   --   --  2.47*  --   --  1.87*  CALCIUM 9.0 8.9  --   --   --   --   --   --  8.7  --   --  8.6  AST 19 14  15   --   --   --   --   --   --   --   --   --   --   ALT 9 7  8   --   --   --   --   --   --   --   --   --   --   ALKPHOS 51 54  51  --   --   --   --   --   --   --   --   --   --   BILITOT 0.1* 0.1*  0.2*  --   --   --   --   --   --   --   --   --   --   PROT 7.2 7.2  7.2  --   --   --   --   --   --   --   --   --   --   ALBUMIN 2.8* 2.9*  2.8*  --   --   --   --   --   --   --   --    --   --   LATICACIDVEN  --   --   --  1.0  --   --   --   --   --   --   --   --   PROBNP 442.0*  --   --   --   --   --   --   --   --   --   --   --   PHART  --   --   < >  --   < > 7.253* 7.308* 7.329*  --   --   --   --   PCO2ART  --   --   < >  --   < > 64.5* 54.8* 51.0*  --   --   --   --   PO2ART  --   --   < >  --   < >  257.0* 76.4* 90.0  --   --   --   --   < > = values in this interval not displayed.  Recent Labs Lab 05/26/12 1537 05/26/12 1938 05/26/12 2343 05/27/12 0411 05/27/12 0712  GLUCAP 101* 101* 100* 99 98    Imaging:  Ct Chest Wo Contrast  05/26/2012  *RADIOLOGY REPORT*  Clinical Data: Right-sided airspace disease and pleural effusion. History of breast cancer, right lower lobectomy, renal failure, right lung carcinoid.  CT CHEST WITHOUT CONTRAST  Technique:  Multidetector CT imaging of the chest was performed following the standard protocol without IV contrast.  Comparison: 04/01/2012  Findings: Diffuse cardiac enlargement.  Calcification in the coronary arteries, aorta, and aortic valve.  Pericardial effusion. Moderate sized right pleural effusion with diffuse atelectasis. Partial aeration of the right lower lung. Small left pleural effusion with basilar atelectasis.  Visualization of the lungs is limited due to respiratory motion artifact and atelectasis. The pleural effusions and atelectatic changes have progressed since the previous study.  No underlying mass lesion is specifically identified, but masses could easily be obscured by the overlying pulmonary parenchymal processes and effusions.  Enteric endotracheal tubes are present.  Diffuse enlargement of the thyroid gland.  Surgical clips in the axillary regions bilaterally.  Old bilateral rib fractures possibly due to thoracotomy.  Cardiac pacemaker with generator pack in the right upper chest.  Limited visualization of the upper abdominal organs.  Diffuse degenerative changes throughout the thoracic spine.  Focal areas  of vague sclerosis in the mid thoracic vertebrae are nonspecific but sclerotic metastases are not excluded.  IMPRESSION: Moderate sized right and small left pleural effusions with pulmonary atelectasis bilaterally.  Effusions have increased and aeration has decreased since previous studies.  Persistent cardiac enlargement of pericardial effusion.  Nonspecific foci of sclerosis and mid thoracic vertebra.  Sclerotic metastasis is not excluded.   Original Report Authenticated By: Burman Nieves, M.D.    [place] Chest  05/25/2012  *RADIOLOGY REPORT*  Clinical Data: Shortness of breath, congestive heart failure, pulmonary edema, right-sided pleural effusion  CHEST ULTRASOUND  Comparison: None.  Findings: Limited ultrasound was performed of the right chest for the purposes of possible thoracentesis.  Ultrasound imaging reveals a small effusion, but this is not felt amendable to percutaneous thoracentesis.  No procedure performed  IMPRESSION: Limited ultrasound of chest finds small pleural effusion not amenable for thoracentesis  Read by Brayton El PA-C   Original Report Authenticated By: Richarda Overlie, M.D.    Dg Chest Port 1 View  05/27/2012  *RADIOLOGY REPORT*  Clinical Data: 67 year old female pneumonia pleural effusion.  PORTABLE CHEST - 1 VIEW  Comparison: Chest CT 05/26/2012 and earlier.  Findings: Semi upright AP view at 0605 hours.  Pigtail right pleural drain now in place.  Interval decreased right pleural effusion and mild to moderately improved right lung ventilation.  Stable endotracheal tube, visualized enteric tube and left chest Port-A-Cath.  Stable right chest cardiac pacemaker.  Stable cardiac size and mediastinal contours.  Decreased vascular congestion.  No confluent opacity in the left lung.  No pneumothorax identified.  IMPRESSION: 1.  Right pleural drain placed with decreased pleural effusion and mild to moderately improved right lung ventilation.  Residual fluid with no pneumothorax identified. 2.   Otherwise stable lines and tubes. 3.  Decreased pulmonary vascular congestion.   Original Report Authenticated By: Erskine Speed, M.D.    Dg Chest Port 1 View  05/26/2012  *RADIOLOGY REPORT*  Clinical Data: Right pleural effusion.  PORTABLE  CHEST - 1 VIEW  Comparison: 05/25/2012  Findings: Endotracheal tube, central venous catheter, and Port-A- Cath are in place.  Dual lead pacer in place.  There is increased pulmonary vascular congestion with slight left perihilar pulmonary edema.  Large loculated right pleural effusion has increased.  Most of the right lung is compressed.  IMPRESSION:  1.  New pulmonary vascular congestion on the left with new slight perihilar edema. 2.  Increased right effusion.   Original Report Authenticated By: Francene Boyers, M.D.    Dg Chest Port 1 View  05/25/2012  *RADIOLOGY REPORT*  Clinical Data: Endotracheal tube and central line placement  PORTABLE CHEST - 1 VIEW  Comparison: 05/24/2012; 05/18/2012; 05/16/2012  Findings:  Grossly unchanged enlarged cardiac silhouette and mediastinal contours.  Interval intubation with endotracheal tube overlying tracheal air column with tip regional to the level of the carina.  Interval placement of right jugular approach of the venous catheter with tip seen at least to the level of the mid SVC.  Otherwise, stable positioning of remaining support apparatus.  Interval increase in now moderate to large partially loculated right-sided pleural effusion and adjacent opacities within the right lung.  Pulmonary vasculature is indistinct.  Right axillary surgical clips.  Grossly unchanged bones.  IMPRESSION: 1.  Endotracheal tube overlies tracheal air column the tip regional to the carina.  Retraction approximately 2 cm is recommended. 2.  Right jugular approach central venous catheter tip projects over at least the mid SVC.  No pneumothorax. 3.  Interval increase in moderate to large likely loculated right- sided pleural effusion and adjacent opacities  within the right lung, atelectasis versus infiltrate.  4.  Suspected worsening pulmonary edema.  This was made a call report.   Original Report Authenticated By: Tacey Ruiz, MD    Ct Perc Pleural Drain W/indwell Cath W/img Guide  05/26/2012  *RADIOLOGY REPORT*  Indication: Increasing right-sided pleural effusion, inability to wean from ventilator  CT GUIDED RIGHT SIDED PLEURAL DRAINAGE CATHETER PLACEMENT  Comparison: Chest CT - 05/25/2012; chest radiograph - 05/26/2012; 05/25/2012; 05/24/2012; chest ultrasound - 05/25/2012  Medications: Fentanyl 50 mcg IV; Versed 2 mg IV  Total Moderate Sedation time: 24 minutes  Contrast: None  Complications: None immediate  Technique / Findings:  Informed written consent was obtained from the patient's family after a discussion of the risks, benefits and alternatives to treatment.  The patient was placed supine on the CT gantry and a pre procedural CT was performed re-demonstrating the known small to moderate-sized right-sided pleural effusion.  The procedure was planned.   A timeout was performed prior to the initiation of the procedure.  The right anterior chest was prepped and draped in the usual sterile fashion.   The overlying soft tissues were anesthetized with 1% lidocaine with epinephrine.  Appropriate trajectory was planned with the use of a 22 gauge spinal needle.  An 18 gauge trocar needle was advanced into the right pleural space a short Amplatz super stiff wire was coiled within the collection. Appropriate positioning was confirmed with a limited CT scan.  The tract was serially dilated allowing placement of a 10 [place] all- purpose drainage catheter.  Appropriate positioning was confirmed with a limited postprocedural CT scan with the drainage catheter directed towards the right lung apex.  Approximately 400 ml of simple, serous fluid was aspirated from the right pleural space.  The tube was connected to a Pleur-vac and sutured in place.  A dressing was placed.   The patient tolerated the procedure  well without immediate post procedural complication.  Impression:  Successful CT guided placement of a 10 [place] all purpose drain catheter into the right pleural space with aspiration of 400 mL of serious, simple appearing pleural fluid.  Samples were sent to the laboratory as requested by the ordering clinical team.   Original Report Authenticated By: Tacey Ruiz, MD      ASSESSMENT / PLAN:  PULMONARY A: Acute on chronic respiratory failure 2nd to PNA and recurrent Rt pleural effusion. Secondary pulmonary hypertension - PA Pressure 104 on ECHO 2/6 Hx of COPD. Hx of OSA/OHS. P:   Pressure support wean as tolerated >> not ready for extubation F/u CXR Scheduled BD's CVTS recommended  placement of pigtail catheter, 400 mL drained   CARDIOVASCULAR A: Hx of CAD, HTN, Diastolic CHF. Hypotension after intubation 2/06 >> Resolved 2/07. P:  Monitor CVP Hold amlodipine, coreg, avapro, lipitor for now   RENAL A:  Acute on chronic renal failure >> likely from diuresis. Hyperkalemia. P:   F/u BMET Monitor urine outpt Decrease IV to KVO Lasix 40 mg x1 2/8   GASTROINTESTINAL A:  Morbid obesity. Nutrition. P:   NPO while on vent Hold initiation of tube feeding 2/8, hopeful to extubate 2/9 Protonix for SUP  HEMATOLOGIC A:  Anemia of chronic disease and critical illness. PRBC transfusion 2/07.  No evidence for bleeding. Hx of Breast cancer. P:  F/u CBC Transfuse for Hb < 7 Heparin gtt in setting of DVT RLE  INFECTIOUS A:  HCAP. P:   D3/x vancomycin, zosyn F/u Cx   ENDOCRINE A:  DM type II with hyperglycemia. P:   SSI Hold lantus Hold tradjenta  NEUROLOGIC A:  Acute encephalopathy 2nd to hypercapnia. Improved 2/07.  Off gtts 2/8.  P:   D/C continuous sedation PRN versed / fentanyl   SUMMARY:  67 y/o F with pleural effusion on vent s/p CT per IR.  RLE DVT on heparin gtt.  Attempt diuresis 2/8 and hopeful for extubation on 2/9.  Teaching service has discussed with pt's family.  They are agreeable to trial of intubation.  They would not want CPR/Defibrillation.   Canary Brim, NP-C [facility] Pgr: [phone] or [phone]  Reviewed above, examined pt, and agree with assessment/plan.  Respiratory mechanics improved.  Keep chest tube in for now.  Still with increased secretions, and volume overload.  Not ready for extubation.  Continue Abx and continue diuresis.  CC time 35 minutes.  Coralyn Helling, MD [facility] Pulmonary/Critical Care 05/27/2012, 11:27 AM Pager:  [phone] After 3pm call: [phone]

## 2012-05-27 NOTE — Progress Notes (Signed)
Pt coughed up frothy bright red bloody secretions into ventilator tubing.  Tubing cleaned out pt suctioned but no other secretions.  RT will continue to monitor.

## 2012-05-27 NOTE — Progress Notes (Signed)
ANTICOAGULATION CONSULT NOTE - FOLLOW UP  Pharmacy Consult:  Heparin Indication:  Right leg DVT  No Known Allergies  Patient Measurements: Height: 5' 4.5" (163.8 cm) Weight: 295 lb 3.1 oz (133.9 kg) IBW/kg (Calculated) : 55.85 Heparin Dosing Weight: 88 kg  Vital Signs: Temp: 100.3 F (37.9 C) (02/08 0900) Temp src: Core (Comment) (02/08 0000) BP: 125/51 mmHg (02/08 0900) Pulse Rate: 90 (02/08 1035)  Labs:  Recent Labs  05/25/12 0535 05/26/12 0500 05/26/12 0611 05/26/12 1205 05/27/12 05/27/12 0423  HGB  --  6.6* 6.6* 7.8*  --  7.4*  HCT  --  24.0* 24.1* 27.6*  --  25.7*  PLT  --  186 205 199  --  186  HEPARINUNFRC  --   --   --   --  0.34 0.43  CREATININE 1.81* 2.47*  --   --   --  1.87*    Estimated Creatinine Clearance: 41.8 ml/min (by C-G formula based on Cr of 1.87).      Assessment: 29 YOF with new DVT diagnosed on 05/26/12 to continue on IV heparin.  Heparin level therapeutic; no bleeding reported.  Patient is also on vancomycin and Zosyn for rule out PNA.  Her renal function is improving.  Vanc 2/6 >> Zosyn 2/6 >>  2/6 urine cx - negative 2/7 pleural fluid cx - pending   Goal of Therapy:  HL 0.3 - 0.7 units/mL Monitor platelets by anticoagulation protocol: Yes    Plan:  - Continue heparin gtt at 1200 units/hr - Daily HL / CBC - Monitor closely for bleeding - Change vanc to 1750mg  IV Q24H - Continue Zosyn 3.375gm IV Q8H, 4 hr infusion - Monitor renal fxn, C/S, vanc trough as indicated     Noa Constante D. Laney Potash, PharmD, BCPS Pager:  [phone] 05/27/2012, 11:05 AM

## 2012-05-27 NOTE — Progress Notes (Signed)
Pt placed back on full support due to increased RR. RT will continue to monitor.  

## 2012-05-28 ENCOUNTER — Inpatient Hospital Stay (HOSPITAL_COMMUNITY): Payer: Medicare Other

## 2012-05-28 DIAGNOSIS — J449 Chronic obstructive pulmonary disease, unspecified: Secondary | ICD-10-CM

## 2012-05-28 DIAGNOSIS — I509 Heart failure, unspecified: Secondary | ICD-10-CM

## 2012-05-28 DIAGNOSIS — J189 Pneumonia, unspecified organism: Secondary | ICD-10-CM

## 2012-05-28 LAB — CBC
HCT: 25.7 % — ABNORMAL LOW (ref 36.0–46.0)
Hemoglobin: 7.3 g/dL — ABNORMAL LOW (ref 12.0–15.0)
MCHC: 28.4 g/dL — ABNORMAL LOW (ref 30.0–36.0)
RBC: 3.64 MIL/uL — ABNORMAL LOW (ref 3.87–5.11)
WBC: 8.7 10*3/uL (ref 4.0–10.5)

## 2012-05-28 LAB — GLUCOSE, CAPILLARY
Glucose-Capillary: 103 mg/dL — ABNORMAL HIGH (ref 70–99)
Glucose-Capillary: 127 mg/dL — ABNORMAL HIGH (ref 70–99)
Glucose-Capillary: 98 mg/dL (ref 70–99)

## 2012-05-28 LAB — IRON AND TIBC
Iron: 16 ug/dL — ABNORMAL LOW (ref 42–135)
Saturation Ratios: 5 % — ABNORMAL LOW (ref 20–55)
UIBC: 286 ug/dL (ref 125–400)

## 2012-05-28 LAB — BASIC METABOLIC PANEL
BUN: 21 mg/dL (ref 6–23)
CO2: 29 mEq/L (ref 19–32)
Chloride: 105 mEq/L (ref 96–112)
Glucose, Bld: 98 mg/dL (ref 70–99)
Potassium: 4.3 mEq/L (ref 3.5–5.1)
Sodium: 141 mEq/L (ref 135–145)

## 2012-05-28 LAB — TROPONIN I
Troponin I: <0.3 ng/mL (ref <0.30)
Troponin I: <0.3 ng/mL (ref <0.30)

## 2012-05-28 LAB — HEPARIN LEVEL (UNFRACTIONATED): Heparin Unfractionated: 0.36 IU/mL (ref 0.30–0.70)

## 2012-05-28 MED ORDER — VANCOMYCIN HCL 10 G IV SOLR
1250.0000 mg | Freq: Two times a day (BID) | INTRAVENOUS | Status: DC
  Administered 2012-05-28 – 2012-05-29 (×2): 1250 mg via INTRAVENOUS
  Filled 2012-05-28 (×3): qty 1250

## 2012-05-28 MED ORDER — FUROSEMIDE 10 MG/ML IJ SOLN
40.0000 mg | Freq: Once | INTRAMUSCULAR | Status: AC
  Administered 2012-05-28: 40 mg via INTRAVENOUS
  Filled 2012-05-28: qty 4

## 2012-05-28 MED ORDER — OSMOLITE 1.2 CAL PO LIQD
1000.0000 mL | ORAL | Status: DC
  Administered 2012-05-28: 1000 mL
  Filled 2012-05-28 (×2): qty 1000

## 2012-05-28 MED ORDER — CARVEDILOL 12.5 MG PO TABS
12.5000 mg | ORAL_TABLET | Freq: Two times a day (BID) | ORAL | Status: DC
  Administered 2012-05-28 – 2012-06-05 (×18): 12.5 mg
  Filled 2012-05-28 (×20): qty 1

## 2012-05-28 MED ORDER — ATORVASTATIN CALCIUM 10 MG PO TABS
10.0000 mg | ORAL_TABLET | Freq: Every day | ORAL | Status: DC
  Administered 2012-05-28 – 2012-06-05 (×8): 10 mg via ORAL
  Filled 2012-05-28 (×10): qty 1

## 2012-05-28 NOTE — Progress Notes (Signed)
PULMONARY  / CRITICAL CARE MEDICINE  Name: Melissa Villa MRN: [id number] DOB: Mar 15, 1946    ADMISSION DATE:  05/24/2012 CONSULTATION DATE:  05/25/2012  REFERRING MD :  Mariana Arn  CHIEF COMPLAINT:  Short or breath  BRIEF PATIENT DESCRIPTION:  67 y/o female former smoker admitted with dry cough, dyspnea, and weakness.  Developed progressive hypoxic/hypercapnic respiratory failure, fever (Tm101.46F) and acute encephalopathy.  Transferred to ICU 2/06 and PCCM assumed medical care.   She was in hospital in December 2013 for PNA, Rt Empyema, AECOPD, and CHF exacerbation.  Significant PMHx of COPD on home oxygen, OSA/OHS, AV block s/p PM, Diastolic CHF, DM type II, Neuropathy, s/p RLLectomy for carcinoid, Breast cancer  SIGNIFICANT EVENTS: 2/05 -  Admit 2/06 - Transfer to ICU, VDRF 2/08 - Tolerating PS wean 5/5 40%   STUDIES:  2/06 Echo >> EF 60 to 65%, grade 1 diastolic dysfx, PAS 104 mmHg 2/06 CT chest >> mod Rt effusion, small Lt effusion  LINES / TUBES: ETT 2/06 >> Rt IJ CVL 2/06 >>  L Chest Port >>>  CULTURES: Blood 2/06 >>  Sputum 2/06>> UC 2/6>>>neg Rt pleural fluid 2/7>>  ANTIBIOTICS: Vancomycin 2/06 >> Zosyn 2/06 >>   SUBJECTIVE:  Denies chest pain/abd pain.  VITAL SIGNS: Temp:  [99.6 F (37.6 C)-101 F (38.3 C)] 100 F (37.8 C) (02/09 0600) Pulse Rate:  [84-95] 87 (02/09 0600) Resp:  [14-30] 14 (02/09 0600) BP: (103-139)/(48-77) 132/56 mmHg (02/09 0600) SpO2:  [94 %-100 %] 95 % (02/09 0600) FiO2 (%):  [40 %] 40 % (02/09 0600)  HEMODYNAMICS: CVP:  [9 mmHg-11 mmHg] 10 mmHg  VENTILATOR SETTINGS: Vent Mode:  [-] PRVC FiO2 (%):  [40 %] 40 % Set Rate:  [18 bmp] 18 bmp Vt Set:  [480 mL] 480 mL PEEP:  [5 cmH20] 5 cmH20 Pressure Support:  [5 cmH20-12 cmH20] 12 cmH20 Plateau Pressure:  [22 cmH20-25 cmH20] 23 cmH20  INTAKE / OUTPUT: Intake/Output     02/08 0701 - 02/09 0700 02/09 0701 - 02/10 0700   I.V. (mL/kg) 1058 (7.9)    Blood     NG/GT     IV  Piggyback 637.5    Total Intake(mL/kg) 1695.5 (12.7)    Urine (mL/kg/hr) 4125 (1.3)    Chest Tube 220 (0.1)    Total Output 4345     Net -2649.5            PHYSICAL EXAMINATION: General:  No distress Neuro:  Follows commands, awake, alert HEENT:  ETT in place, mm pink/moist Cardiovascular:  s1s2 regular, no murmur Lungs:  B/l rhonchi, no wheeze, decreased BS at Rt base, R CT x1 Abdomen:  Obese, soft, decreased bowel sounds Musculoskeletal:  No edema Skin: post-XRT changes Rt chest  LABS:  Recent Labs Lab 05/24/12 1211  05/25/12 0535  05/25/12 1930  05/25/12 2053 05/25/12 2359 05/26/12 0315 05/26/12 0500  05/26/12 1205 05/27/12 0423 05/27/12 2121 05/28/12 0301  HGB 7.5*  --   --   --   --   --   --   --   --  6.6*  < > 7.8* 7.4*  --  7.3*  WBC 9.5  --   --   --   --   --   --   --   --  8.8  < > 9.4 9.5  --  8.7  PLT 211  --   --   --   --   --   --   --   --  186  < > 199 186  --  162  NA 141  --  140  --   --   --   --   --   --  140  --   --  141  --  141  K 5.5*  --  5.1  --   --   --   --   --   --  5.1  --   --  4.7  --  4.3  CL 106  --  106  --   --   --   --   --   --  106  --   --  107  --  105  CO2 25  --  27  --   --   --   --   --   --  27  --   --  27  --  29  GLUCOSE 116*  --  131*  --   --   --   --   --   --  77  --   --  90  --  98  BUN 25*  --  27*  --   --   --   --   --   --  35*  --   --  29*  --  21  CREATININE 1.62*  < > 1.81*  --   --   --   --   --   --  2.47*  --   --  1.87*  --  1.48*  CALCIUM 9.0  --  8.9  --   --   --   --   --   --  8.7  --   --  8.6  --  8.5  AST 19  --  14  15  --   --   --   --   --   --   --   --   --   --   --   --   ALT 9  --  7  8  --   --   --   --   --   --   --   --   --   --   --   --   ALKPHOS 51  --  54  51  --   --   --   --   --   --   --   --   --   --   --   --   BILITOT 0.1*  --  0.1*  0.2*  --   --   --   --   --   --   --   --   --   --   --   --   PROT 7.2  --  7.2  7.2  --   --   --   --   --    --   --   --   --   --   --   --   ALBUMIN 2.8*  --  2.9*  2.8*  --   --   --   --   --   --   --   --   --   --   --   --   LATICACIDVEN  --   --   --   --  1.0  --   --   --   --   --   --   --   --   --   --  TROPONINI  --   --   --   --   --   --   --   --   --   --   --   --   --  <0.30 <0.30  PROBNP 442.0*  --   --   --   --   --   --   --   --   --   --   --   --   --   --   PHART  --   --   --   < >  --   < > 7.253* 7.308* 7.329*  --   --   --   --   --   --   PCO2ART  --   --   --   < >  --   < > 64.5* 54.8* 51.0*  --   --   --   --   --   --   PO2ART  --   --   --   < >  --   < > 257.0* 76.4* 90.0  --   --   --   --   --   --   < > = values in this interval not displayed.  Recent Labs Lab 05/27/12 1113 05/27/12 1517 05/27/12 1923 05/27/12 2344 05/28/12 0354  GLUCAP 113* 113* 102* 103* 98    Imaging:  Dg Chest Port 1 View  05/28/2012  *RADIOLOGY REPORT*  Clinical Data: Check endotracheal tube.  Right effusion.  Chest tube.  PORTABLE CHEST - 1 VIEW  Comparison: 05/27/2012  Findings: The patient is rotated towards the right, limiting the study.  Shallow inspiration.  Right pleural effusion with atelectasis or consolidation in the right mid lung.  Right chest tube is in place.  Right central venous catheter, left central venous catheter, endotracheal tube, and enteric tubes are not changed in position. Perihilar infiltration on the left likely represents edema.  No pneumothorax.  Surgical clips in the axilla bilaterally.  Cardiac pacemaker.  IMPRESSION: No significant change since yesterday.  Appliances appear to be stable in position, allowing for patient rotation.  Persistent right pleural effusion and consolidation in the right lung. Perihilar infiltration or edema in the left lung.   Original Report Authenticated By: Burman Nieves, M.D.    Dg Chest Port 1 View  05/27/2012  *RADIOLOGY REPORT*  Clinical Data: 67 year old female pneumonia pleural effusion.  PORTABLE CHEST - 1 VIEW   Comparison: Chest CT 05/26/2012 and earlier.  Findings: Semi upright AP view at 0605 hours.  Pigtail right pleural drain now in place.  Interval decreased right pleural effusion and mild to moderately improved right lung ventilation.  Stable endotracheal tube, visualized enteric tube and left chest Port-A-Cath.  Stable right chest cardiac pacemaker.  Stable cardiac size and mediastinal contours.  Decreased vascular congestion.  No confluent opacity in the left lung.  No pneumothorax identified.  IMPRESSION: 1.  Right pleural drain placed with decreased pleural effusion and mild to moderately improved right lung ventilation.  Residual fluid with no pneumothorax identified. 2.  Otherwise stable lines and tubes. 3.  Decreased pulmonary vascular congestion.   Original Report Authenticated By: Erskine Speed, M.D.    Ct Perc Pleural Drain W/indwell Cath W/img Guide  05/26/2012  *RADIOLOGY REPORT*  Indication: Increasing right-sided pleural effusion, inability to wean from ventilator  CT GUIDED RIGHT SIDED PLEURAL DRAINAGE CATHETER PLACEMENT  Comparison: Chest CT - 05/25/2012; chest radiograph -  05/26/2012; 05/25/2012; 05/24/2012; chest ultrasound - 05/25/2012  Medications: Fentanyl 50 mcg IV; Versed 2 mg IV  Total Moderate Sedation time: 24 minutes  Contrast: None  Complications: None immediate  Technique / Findings:  Informed written consent was obtained from the patient's family after a discussion of the risks, benefits and alternatives to treatment.  The patient was placed supine on the CT gantry and a pre procedural CT was performed re-demonstrating the known small to moderate-sized right-sided pleural effusion.  The procedure was planned.   A timeout was performed prior to the initiation of the procedure.  The right anterior chest was prepped and draped in the usual sterile fashion.   The overlying soft tissues were anesthetized with 1% lidocaine with epinephrine.  Appropriate trajectory was planned with the use of a  22 gauge spinal needle.  An 18 gauge trocar needle was advanced into the right pleural space a short Amplatz super stiff wire was coiled within the collection. Appropriate positioning was confirmed with a limited CT scan.  The tract was serially dilated allowing placement of a 10 [place] all- purpose drainage catheter.  Appropriate positioning was confirmed with a limited postprocedural CT scan with the drainage catheter directed towards the right lung apex.  Approximately 400 ml of simple, serous fluid was aspirated from the right pleural space.  The tube was connected to a Pleur-vac and sutured in place.  A dressing was placed.  The patient tolerated the procedure well without immediate post procedural complication.  Impression:  Successful CT guided placement of a 10 [place] all purpose drain catheter into the right pleural space with aspiration of 400 mL of serious, simple appearing pleural fluid.  Samples were sent to the laboratory as requested by the ordering clinical team.   Original Report Authenticated By: Tacey Ruiz, MD      ASSESSMENT / PLAN:  PULMONARY A: Acute on chronic respiratory failure 2nd to PNA and recurrent Rt pleural effusion. Secondary pulmonary hypertension - PA Pressure 104 on ECHO 2/6 Hx of COPD. Hx of OSA/OHS. P:   Pressure support wean as tolerated >> not ready for extubation F/u CXR Scheduled BD's Continue chest tube   CARDIOVASCULAR A: Hx of CAD, HTN, Diastolic CHF. Hypotension after intubation 2/06 >> Resolved 2/07. P:  Monitor CVP >> goal less than 10 Restart coreg, crestor >> use lipitor while in hospital Hold amlodipine, avapro for now   RENAL A:  Acute on chronic renal failure >> likely from diuresis. Improved. Hyperkalemia >> resolved. P:   F/u BMET Monitor urine outpt Decreased IV to KVO Lasix 40 mg x1 2/9  GASTROINTESTINAL A:  Morbid obesity. Nutrition. P:   Start tube feeds while on vent Protonix for SUP  HEMATOLOGIC A:  Anemia of  chronic disease and critical illness. PRBC transfusion 2/07.  No evidence for bleeding. Hx of Breast cancer. P:  F/u CBC Transfuse for Hb < 7 Heparin gtt in setting of DVT RLE Check anemia panel  INFECTIOUS A:  HCAP. P:   D4/x vancomycin, zosyn F/u Cx   ENDOCRINE A:  DM type II with hyperglycemia. P:   SSI Hold lantus Hold tradjenta  NEUROLOGIC A:  Acute encephalopathy 2nd to hypercapnia. Improved 2/07.  Off gtts 2/8.  P:   PRN versed / fentanyl   SUMMARY:  67 y/o F with pleural effusion on vent s/p CT per IR.  RLE DVT on heparin gtt.  Attempt diuresis 2/8 and hopeful for extubation on 2/9. Teaching service has discussed with pt's  family.  They are agreeable to trial of intubation.  They would not want CPR/Defibrillation.  CC time 35 minutes.  Coralyn Helling, MD [facility] Pulmonary/Critical Care 05/28/2012, 7:17 AM Pager:  [phone] After 3pm call: [phone]

## 2012-05-28 NOTE — Progress Notes (Signed)
ANTICOAGULATION CONSULT NOTE - FOLLOW UP  Pharmacy Consult:  Heparin + Vancomycin/Zosyn Indication:  Right leg DVT + r/o PNA  No Known Allergies  Patient Measurements: Height: 5' 4.5" (163.8 cm) Weight: 295 lb 3.1 oz (133.9 kg) IBW/kg (Calculated) : 55.85 Heparin Dosing Weight: 88 kg  Vital Signs: Temp: 100 F (37.8 C) (02/09 0900) Temp src: Core (Comment) (02/09 0600) BP: 134/52 mmHg (02/09 0900) Pulse Rate: 91 (02/09 0900)  Labs:  Recent Labs  05/26/12 0500  05/26/12 1205 05/27/12 05/27/12 0423 05/27/12 2121 05/28/12 0301 05/28/12 0857  HGB 6.6*  < > 7.8*  --  7.4*  --  7.3*  --   HCT 24.0*  < > 27.6*  --  25.7*  --  25.7*  --   PLT 186  < > 199  --  186  --  162  --   HEPARINUNFRC  --   --   --  0.34 0.43  --  0.36  --   CREATININE 2.47*  --   --   --  1.87*  --  1.48*  --   TROPONINI  --   --   --   --   --  <0.30 <0.30 <0.30  < > = values in this interval not displayed.  Estimated Creatinine Clearance: 52.8 ml/min (by C-G formula based on Cr of 1.48).      Assessment: 53 YOF with new DVT diagnosed on 05/26/12 to continue on IV heparin.  Heparin level remains therapeutic; no bleeding reported.  Pharmacy also managing vancomycin and Zosyn for HCAP.  Patient's renal function is improving and expect it to continue to improve.  Vanc 2/6 >> Zosyn 2/6 >>  2/6 urine cx - negative 2/6 blood cx - NGTD 2/7 pleural fluid cx - NGTD    Goal of Therapy:  HL 0.3 - 0.7 units/mL Monitor platelets by anticoagulation protocol: Yes Vanc trough 15-20 mcg/mL    Plan:  - Continue heparin gtt at 1200 units/hr - Daily HL / CBC - Monitor closely for bleeding - Change vanc to 1250mg  IV Q12H - Continue Zosyn 3.375gm IV Q8H, 4 hr infusion - Monitor renal fxn, C/S, vanc trough as indicated - F/U long-term anticoagulation plan     Emit Kuenzel D. Laney Potash, PharmD, BCPS Pager:  [phone] 05/28/2012, 10:49 AM

## 2012-05-28 NOTE — Progress Notes (Signed)
PS increased to 10.  

## 2012-05-28 NOTE — Progress Notes (Signed)
Brief Nutrition Note  Consult received for enteral/tube feeding initiation and management.  TF started with Osmolite 1.2 @ 20 ml/hr. Full assessment to follow.  Admitting Dx: CFH Cancer DM hypertention  Body mass index is 49.91 kg/(m^2). Pt meets criteria for extreme obesity based on current BMI.  CMP     Component Value Date/Time   NA 141 05/28/2012 0301   NA 140 05/05/2012 1101   K 4.3 05/28/2012 0301   K 4.6 05/05/2012 1101   CL 105 05/28/2012 0301   CL 105 05/05/2012 1101   CO2 29 05/28/2012 0301   CO2 26 05/05/2012 1101   GLUCOSE 98 05/28/2012 0301   GLUCOSE 104* 05/05/2012 1101   BUN 21 05/28/2012 0301   BUN 19.0 05/05/2012 1101   CREATININE 1.48* 05/28/2012 0301   CREATININE 1.62* 05/24/2012 1211   CREATININE 1.1 05/05/2012 1101   CALCIUM 8.5 05/28/2012 0301   CALCIUM 8.5 05/05/2012 1101   PROT 7.2 05/25/2012 0535   PROT 7.2 05/25/2012 0535   PROT 6.9 05/05/2012 1101   ALBUMIN 2.9* 05/25/2012 0535   ALBUMIN 2.8* 05/25/2012 0535   ALBUMIN 2.5* 05/05/2012 1101   AST 14 05/25/2012 0535   AST 15 05/25/2012 0535   AST 15 05/05/2012 1101   ALT 7 05/25/2012 0535   ALT 8 05/25/2012 0535   ALT 13 05/05/2012 1101   ALKPHOS 54 05/25/2012 0535   ALKPHOS 51 05/25/2012 0535   ALKPHOS 65 05/05/2012 1101   BILITOT 0.1* 05/25/2012 0535   BILITOT 0.2* 05/25/2012 0535   BILITOT 0.20 05/05/2012 1101   GFRNONAA 36* 05/28/2012 0301   GFRAA 42* 05/28/2012 0301    Phosphorus  Date/Time Value Range Status  04/03/2012  5:40 AM 4.8* 2.3 - 4.6 mg/dL Final  45/40/9811  9:14 AM 5.5* 2.3 - 4.6 mg/dL Final  78/29/5621  3:08 AM 2.9  2.3 - 4.6 mg/dL Final   Magnesium  Date/Time Value Range Status  04/02/2012  7:29 AM 2.0  1.5 - 2.5 mg/dL Final  65/78/4696  2:95 AM 2.0  1.5 - 2.5 mg/dL Final  28/41/3244  0:10 AM 2.1  1.5 - 2.5 mg/dL Final     Ebbie Latus RD, LDN

## 2012-05-29 ENCOUNTER — Inpatient Hospital Stay (HOSPITAL_COMMUNITY): Payer: Medicare Other

## 2012-05-29 DIAGNOSIS — I442 Atrioventricular block, complete: Secondary | ICD-10-CM

## 2012-05-29 DIAGNOSIS — R52 Pain, unspecified: Secondary | ICD-10-CM

## 2012-05-29 LAB — HEPARIN LEVEL (UNFRACTIONATED): Heparin Unfractionated: 0.4 IU/mL (ref 0.30–0.70)

## 2012-05-29 LAB — GLUCOSE, CAPILLARY
Glucose-Capillary: 118 mg/dL — ABNORMAL HIGH (ref 70–99)
Glucose-Capillary: 126 mg/dL — ABNORMAL HIGH (ref 70–99)
Glucose-Capillary: 130 mg/dL — ABNORMAL HIGH (ref 70–99)
Glucose-Capillary: 140 mg/dL — ABNORMAL HIGH (ref 70–99)

## 2012-05-29 LAB — BASIC METABOLIC PANEL
BUN: 15 mg/dL (ref 6–23)
Chloride: 106 mEq/L (ref 96–112)
GFR calc Af Amer: 55 mL/min — ABNORMAL LOW (ref >90)
Potassium: 3.7 mEq/L (ref 3.5–5.1)

## 2012-05-29 LAB — CBC
HCT: 27.1 % — ABNORMAL LOW (ref 36.0–46.0)
Hemoglobin: 7.5 g/dL — ABNORMAL LOW (ref 12.0–15.0)
MCHC: 27.7 g/dL — ABNORMAL LOW (ref 30.0–36.0)

## 2012-05-29 MED ORDER — MIDAZOLAM HCL 2 MG/2ML IJ SOLN
INTRAMUSCULAR | Status: AC
  Administered 2012-05-29: 0.5 mg
  Filled 2012-05-29: qty 2

## 2012-05-29 MED ORDER — DEXTROSE 5 % IV SOLN
1.0000 g | INTRAVENOUS | Status: AC
  Administered 2012-05-29 – 2012-06-01 (×4): 1 g via INTRAVENOUS
  Filled 2012-05-29 (×4): qty 10

## 2012-05-29 MED ORDER — PRO-STAT SUGAR FREE PO LIQD
60.0000 mL | Freq: Four times a day (QID) | ORAL | Status: DC
  Administered 2012-05-29: 60 mL
  Filled 2012-05-29 (×4): qty 60

## 2012-05-29 MED ORDER — OSMOLITE 1.5 CAL PO LIQD
1000.0000 mL | ORAL | Status: DC
  Filled 2012-05-29 (×2): qty 1000

## 2012-05-29 MED ORDER — MIDAZOLAM HCL 5 MG/ML IJ SOLN: 0.5000 mg | Freq: Once | INTRAMUSCULAR | Status: AC

## 2012-05-29 MED ORDER — ADULT MULTIVITAMIN LIQUID CH
5.0000 mL | Freq: Every day | ORAL | Status: DC
  Administered 2012-05-29 – 2012-06-02 (×5): 5 mL
  Filled 2012-05-29 (×9): qty 5

## 2012-05-29 MED ORDER — FUROSEMIDE 10 MG/ML IJ SOLN
40.0000 mg | Freq: Once | INTRAMUSCULAR | Status: AC
  Administered 2012-05-29: 40 mg via INTRAVENOUS
  Filled 2012-05-29: qty 4

## 2012-05-29 NOTE — Progress Notes (Signed)
ANTICOAGULATION CONSULT NOTE - FOLLOW UP  Pharmacy Consult:  Heparin Indication:  Right leg DVT  No Known Allergies  Patient Measurements: Height: 5' 4.5" (163.8 cm) Weight: 295 lb 3.1 oz (133.9 kg) IBW/kg (Calculated) : 55.85 Heparin Dosing Weight: 88 kg  Vital Signs: Temp: 99.1 F (37.3 C) (02/10 0400) Temp src: Core (Comment) (02/10 0400) BP: 125/66 mmHg (02/10 0400) Pulse Rate: 81 (02/10 0400)  Labs:  Recent Labs  05/27/12 0423 05/27/12 2121 05/28/12 0301 05/28/12 0857 05/29/12 0455  HGB 7.4*  --  7.3*  --  7.5*  HCT 25.7*  --  25.7*  --  27.1*  PLT 186  --  162  --  151  HEPARINUNFRC 0.43  --  0.36  --  0.25*  CREATININE 1.87*  --  1.48*  --   --   TROPONINI  --  <0.30 <0.30 <0.30  --     Estimated Creatinine Clearance: 52.8 ml/min (by C-G formula based on Cr of 1.48).  Assessment: 67 YO Female with DVT for heparin  Goal of Therapy:  HL 0.3 - 0.7 units/mL Monitor platelets by anticoagulation protocol: Yes  Plan:  Increase Heparin 1350 units/hr  Geannie Risen, PharmD, BCPS  05/29/2012, 5:36 AM

## 2012-05-29 NOTE — Progress Notes (Signed)
Subjective: Pt remains intubated but awake and alert, nods appropriately. Denies pain at drain site.  Objective: Physical Exam: BP 142/55  Pulse 77  Temp(Src) 99 F (37.2 C) (Core (Comment))  Resp 6  Ht 5' 4.5" (1.638 m)  Wt 295 lb 3.1 oz (133.9 kg)  BMI 49.91 kg/m2  SpO2 98% (R)chest tube intact, site clean, NT Output remains clear yellow, 170cc recorded yesterday    Labs: CBC  Recent Labs  05/28/12 0301 05/29/12 0455  WBC 8.7 7.4  HGB 7.3* 7.5*  HCT 25.7* 27.1*  PLT 162 151   BMET  Recent Labs  05/28/12 0301 05/29/12 0455  NA 141 144  K 4.3 3.7  CL 105 106  CO2 29 32  GLUCOSE 98 131*  BUN 21 15  CREATININE 1.48* 1.19*  CALCIUM 8.5 8.9   LFT No results found for this basename: PROT, ALBUMIN, AST, ALT, ALKPHOS, BILITOT, BILIDIR, IBILI, LIPASE,  in the last 72 hours PT/INR No results found for this basename: LABPROT, INR,  in the last 72 hours   Studies/Results: Dg Chest Port 1 View  05/29/2012  *RADIOLOGY REPORT*  Clinical Data: Pneumonia  PORTABLE CHEST - 1 VIEW  Comparison: Yesterday  Findings: Borderline cardiomegaly.  Bilateral pulmonary edema improved.  Right pleural effusion improved.  Right chest tube remains in place.  Right internal jugular vein central venous catheter and left internal jugular vein Port-A-Cath unchanged. Endotracheal and NG tubes stable.  No pneumothorax.  IMPRESSION: Improved edema.  Improved right pleural effusion.   Original Report Authenticated By: Jolaine Click, M.D.    Dg Chest Port 1 View  05/28/2012  *RADIOLOGY REPORT*  Clinical Data: Check endotracheal tube.  Right effusion.  Chest tube.  PORTABLE CHEST - 1 VIEW  Comparison: 05/27/2012  Findings: The patient is rotated towards the right, limiting the study.  Shallow inspiration.  Right pleural effusion with atelectasis or consolidation in the right mid lung.  Right chest tube is in place.  Right central venous catheter, left central venous catheter, endotracheal tube, and  enteric tubes are not changed in position. Perihilar infiltration on the left likely represents edema.  No pneumothorax.  Surgical clips in the axilla bilaterally.  Cardiac pacemaker.  IMPRESSION: No significant change since yesterday.  Appliances appear to be stable in position, allowing for patient rotation.  Persistent right pleural effusion and consolidation in the right lung. Perihilar infiltration or edema in the left lung.   Original Report Authenticated By: Burman Nieves, M.D.     Assessment/Plan: Rt pleural effusion s/p chest tube placed 2/7 Output trending down and xrays improving. Will cont to follow.    LOS: 5 days    Brayton El PA-C 05/29/2012 9:30 AM

## 2012-05-29 NOTE — Progress Notes (Signed)
ANTICOAGULATION CONSULT NOTE - FOLLOW UP  Pharmacy Consult:  Heparin Indication:  Right leg DVT  No Known Allergies  Patient Measurements: Height: 5' 4.5" (163.8 cm) Weight: 295 lb 3.1 oz (133.9 kg) IBW/kg (Calculated) : 55.85 Heparin Dosing Weight: 88 kg  Vital Signs: Temp: 99.4 F (37.4 C) (02/10 1400) Temp src: Core (Comment) (02/10 1400) BP: 135/58 mmHg (02/10 1300) Pulse Rate: 87 (02/10 1400)  Labs:  Recent Labs  05/27/12 0423 05/27/12 2121 05/28/12 0301 05/28/12 0857 05/29/12 0455 05/29/12 1200  HGB 7.4*  --  7.3*  --  7.5*  --   HCT 25.7*  --  25.7*  --  27.1*  --   PLT 186  --  162  --  151  --   HEPARINUNFRC 0.43  --  0.36  --  0.25* 0.40  CREATININE 1.87*  --  1.48*  --  1.19*  --   TROPONINI  --  <0.30 <0.30 <0.30  --   --     Estimated Creatinine Clearance: 65.7 ml/min (by C-G formula based on Cr of 1.19).  Assessment: 67 YO female with DVT on heparin. Heparin level therapeutic. No bleeding noted. Noted plt decreasing, will watch.  Goal of Therapy:  HL 0.3 - 0.7 units/mL Monitor platelets by anticoagulation protocol: Yes  Plan:  1) Continue Heparin 1350 units/hr 2) Daily heparin level and CBC  Christoper Fabian, PharmD, BCPS Clinical pharmacist, pager [phone] 05/29/2012, 2:45 PM

## 2012-05-29 NOTE — Procedures (Signed)
Extubation Procedure Note  Patient Details:   Name: Melissa Villa DOB: 02-04-1946 MRN: [id number]   Airway Documentation:  Airway 7.5 mm (Active)  Secured at (cm) 22 cm 05/29/2012 11:02 AM  Measured From Lips 05/29/2012 11:02 AM  Secured Location Center 05/29/2012 11:02 AM  Secured By [employer] 05/29/2012 11:02 AM  Tube Holder Repositioned Yes 05/29/2012 11:02 AM  Cuff Pressure (cm H2O) 28 cm H2O 05/29/2012  2:06 AM  Site Condition Dry 05/29/2012  7:22 AM    Evaluation  O2 sats: stable throughout Complications: No apparent complications Patient did tolerate procedure well. Bilateral Breath Sounds: Clear;Diminished Suctioning: Airway Yes  Ave Filter 05/29/2012, 2:48 PM

## 2012-05-29 NOTE — Progress Notes (Signed)
PULMONARY  / CRITICAL CARE MEDICINE  Name: Melissa Villa MRN: [id number] DOB: 1945/06/05    ADMISSION DATE:  05/24/2012 CONSULTATION DATE:  05/25/2012  REFERRING MD :  Mariana Arn  CHIEF COMPLAINT:  Short or breath  BRIEF PATIENT DESCRIPTION:  67 y/o female former smoker admitted with dry cough, dyspnea, and weakness.  Developed progressive hypoxic/hypercapnic respiratory failure, fever (Tm101.61F) and acute encephalopathy.  Transferred to ICU 2/06 and PCCM assumed medical care.   She was in hospital in December 2013 for PNA, Rt Empyema, AECOPD, and CHF exacerbation.  Significant PMHx of COPD on home oxygen, OSA/OHS, AV block s/p PM, Diastolic CHF, DM type II, Neuropathy, s/p RLLectomy for carcinoid, Breast cancer  SIGNIFICANT EVENTS: 2/05 -  Admit 2/06 - Transfer to ICU, VDRF 2/08 - Tolerating PS wean 5/5 40%  STUDIES:  2/06 Echo >> EF 60 to 65%, grade 1 diastolic dysfx, PAS 104 mmHg 2/06 CT chest >> mod Rt effusion, small Lt effusion  LINES / TUBES: ETT 2/06 >> Rt IJ CVL 2/06 >>  L Chest Port >>> R Chest Tube 2/7 >>  CULTURES: Blood 2/06 >>  No growth 5 days Sputum 2/06>> Canceled UC 2/6>>>neg Rt pleural fluid 2/7>> No growth 3 days, rare WBC  ANTIBIOTICS: Vancomycin 2/06 >>2/10 Zosyn 2/06 >> 2/10 ceftriaxone 2/10>>>2/13  SUBJECTIVE:  Denies chest pain/abd pain. Neg balance  VITAL SIGNS: Temp:  [99 F (37.2 C)-100 F (37.8 C)] 99 F (37.2 C) (02/10 0900) Pulse Rate:  [72-94] 85 (02/10 0900) Resp:  [3-33] 22 (02/10 0900) BP: (108-148)/(43-111) 116/61 mmHg (02/10 0900) SpO2:  [95 %-100 %] 97 % (02/10 0900) FiO2 (%):  [40 %] 40 % (02/10 0900)  HEMODYNAMICS: CVP:  [7 mmHg-12 mmHg] 8 mmHg  VENTILATOR SETTINGS: Vent Mode:  [-] CPAP;PSV FiO2 (%):  [40 %] 40 % Set Rate:  [18 bmp] 18 bmp Vt Set:  [480 mL] 480 mL PEEP:  [5 cmH20] 5 cmH20 Pressure Support:  [5 cmH20-12 cmH20] 5 cmH20 Plateau Pressure:  [21 cmH20-23 cmH20] 23 cmH20  INTAKE /  OUTPUT: Intake/Output     02/09 0701 - 02/10 0700 02/10 0701 - 02/11 0700   I.V. (mL/kg) 786 (5.9)    NG/GT 360    IV Piggyback 709    Total Intake(mL/kg) 1855 (13.9)    Urine (mL/kg/hr) 3350 (1)    Stool 1 (0)    Chest Tube 170 (0.1)    Total Output 3521     Net -1666            PHYSICAL EXAMINATION: General:  No distress Neuro:  Follows commands, awake, alert HEENT:  ETT in place, mm pink/moist Cardiovascular:  s1s2 regular, no murmur Lungs:  B/l rhonchi, no wheeze, decreased BS at Rt base, R CT x1 Abdomen:  Obese, soft, decreased bowel sounds Musculoskeletal:  No edema Skin: post-XRT changes Rt chest  LABS:  Recent Labs Lab 05/24/12 1211 05/25/12 0535  05/25/12 1930  05/25/12 2053 05/25/12 2359 05/26/12 0315  05/27/12 0423 05/27/12 2121 05/28/12 0301 05/28/12 0857 05/29/12 0455  HGB 7.5*  --   --   --   --   --   --   --   < > 7.4*  --  7.3*  --  7.5*  WBC 9.5  --   --   --   --   --   --   --   < > 9.5  --  8.7  --  7.4  PLT 211  --   --   --   --   --   --   --   < >  186  --  162  --  151  NA 141 140  --   --   --   --   --   --   < > 141  --  141  --  144  K 5.5* 5.1  --   --   --   --   --   --   < > 4.7  --  4.3  --  3.7  CL 106 106  --   --   --   --   --   --   < > 107  --  105  --  106  CO2 25 27  --   --   --   --   --   --   < > 27  --  29  --  32  GLUCOSE 116* 131*  --   --   --   --   --   --   < > 90  --  98  --  131*  BUN 25* 27*  --   --   --   --   --   --   < > 29*  --  21  --  15  CREATININE 1.62* 1.81*  --   --   --   --   --   --   < > 1.87*  --  1.48*  --  1.19*  CALCIUM 9.0 8.9  --   --   --   --   --   --   < > 8.6  --  8.5  --  8.9  AST 19 14  15   --   --   --   --   --   --   --   --   --   --   --   --   ALT 9 7  8   --   --   --   --   --   --   --   --   --   --   --   --   ALKPHOS 51 54  51  --   --   --   --   --   --   --   --   --   --   --   --   BILITOT 0.1* 0.1*  0.2*  --   --   --   --   --   --   --   --   --   --   --    --   PROT 7.2 7.2  7.2  --   --   --   --   --   --   --   --   --   --   --   --   ALBUMIN 2.8* 2.9*  2.8*  --   --   --   --   --   --   --   --   --   --   --   --   LATICACIDVEN  --   --   --  1.0  --   --   --   --   --   --   --   --   --   --   TROPONINI  --   --   --   --   --   --   --   --   --   --  <  0.30 <0.30 <0.30  --   PROBNP 442.0*  --   --   --   --   --   --   --   --   --   --   --   --   --   PHART  --   --   < >  --   < > 7.253* 7.308* 7.329*  --   --   --   --   --   --   PCO2ART  --   --   < >  --   < > 64.5* 54.8* 51.0*  --   --   --   --   --   --   PO2ART  --   --   < >  --   < > 257.0* 76.4* 90.0  --   --   --   --   --   --   < > = values in this interval not displayed.  Recent Labs Lab 05/28/12 1545 05/28/12 1920 05/28/12 2344 05/29/12 0343 05/29/12 0755  GLUCAP 127* 136* 126* 118* 141*    Imaging:  Dg Chest Port 1 View  05/29/2012  *RADIOLOGY REPORT*  Clinical Data: Pneumonia  PORTABLE CHEST - 1 VIEW  Comparison: Yesterday  Findings: Borderline cardiomegaly.  Bilateral pulmonary edema improved.  Right pleural effusion improved.  Right chest tube remains in place.  Right internal jugular vein central venous catheter and left internal jugular vein Port-A-Cath unchanged. Endotracheal and NG tubes stable.  No pneumothorax.  IMPRESSION: Improved edema.  Improved right pleural effusion.   Original Report Authenticated By: Jolaine Click, M.D.    Dg Chest Port 1 View  05/28/2012  *RADIOLOGY REPORT*  Clinical Data: Check endotracheal tube.  Right effusion.  Chest tube.  PORTABLE CHEST - 1 VIEW  Comparison: 05/27/2012  Findings: The patient is rotated towards the right, limiting the study.  Shallow inspiration.  Right pleural effusion with atelectasis or consolidation in the right mid lung.  Right chest tube is in place.  Right central venous catheter, left central venous catheter, endotracheal tube, and enteric tubes are not changed in position. Perihilar infiltration on  the left likely represents edema.  No pneumothorax.  Surgical clips in the axilla bilaterally.  Cardiac pacemaker.  IMPRESSION: No significant change since yesterday.  Appliances appear to be stable in position, allowing for patient rotation.  Persistent right pleural effusion and consolidation in the right lung. Perihilar infiltration or edema in the left lung.   Original Report Authenticated By: Burman Nieves, M.D.      ASSESSMENT / PLAN:  PULMONARY A: Acute on chronic respiratory failure 2nd to PNA and recurrent Rt pleural effusion. Secondary pulmonary hypertension - PA Pressure 104 on ECHO 2/6 Hx of COPD. Hx of OSA/OHS. P:   Pressure support wean as tolerated >> currently on weaning trial, cpap5 ps 5, goal 30 min  CXR tomorrow AM Scheduled BD's Continue chest tube to waterseal IMproved pcxr overall Continue lasix  CARDIOVASCULAR A: Hx of CAD, HTN, Diastolic CHF. Hypotension after intubation 2/06 >> Resolved 2/07. P:  Dc cvp, continue lasix same dose Restart coreg, crestor >> use lipitor while in hospital Hold amlodipine, avapro while remains normotensive  RENAL A:  Acute on chronic renal failure >> likely from diuresis. Improved. Hyperkalemia >> resolved. P:   F/u daily BMET (Cr trending down) Monitor urine outpt Decreased IV to KVO Lasix 40 mg x1 2/9, repeat 2/10  GASTROINTESTINAL A:  Morbid obesity.  Nutrition. P:   Tube feeds while on vent, hold for weaning Protonix for SUP  HEMATOLOGIC A:  Anemia of chronic disease and critical illness. PRBC transfusion 2/07.  No current evidence for bleeding. Hx of Breast cancer. thrombocytopenia P:  F/u CBC daily, Hbg stable for past several days Transfuse for Hb < 7 Heparin gtt in setting of DVT RLE Anemia panel consistent with iron deficiency anemia if drop in am will start argatroban and dc hep and assess hitt panel  INFECTIOUS A:  HCAP.culture neg, exudative effusion P:   D4/x vancomycin, zosyn-dc Add  ceftriaxone , add stop date 13th  ENDOCRINE A:  DM type II with hyperglycemia. P:   SSI, currently well controlled  NEUROLOGIC A:  Acute encephalopathy 2nd to hypercapnia. Improved 2/07.  Off gtts 2/8.  P:   PRN versed / fentanyl  WUA Upright positon  SUMMARY:  67 y/o F with pleural effusion on vent s/p CT per IR.  RLE DVT on heparin gtt.  Gentle diuresis 2/8-2/10 and hopeful for extubation on 2/10. Teaching service has discussed with pt's family.  They are agreeable to trial of intubation.  They would not want CPR/Defibrillation.  CC time 35 minutes.  I have fully examined this patient and agree with above findings.    And edite din full  Mcarthur Rossetti. Tyson Alias, MD, FACP Pgr: [phone] [facility]

## 2012-05-29 NOTE — Progress Notes (Signed)
NUTRITION FOLLOW UP / CONSULT  Intervention:    Change TF via OG tube to Osmolite 1.5 at 20 ml/h, goal rate with Prostat 60 ml QID to provide 1520 kcals, 150 gm protein, 366 ml free water daily.  Liquid MVI daily to help meet 100% DRI's.  Nutrition Dx:   Inadequate oral intake related to inability to eat as evidenced by NPO status, ongoing.  Goal:   Enteral nutrition to provide 60-70% of estimated calorie needs (22-25 kcals/kg ideal body weight) and >/= 90% of estimated protein needs, based on ASPEN guidelines for permissive underfeeding in critically ill obese individuals, unmet.  Monitor:   TF tolerance/adequacy, labs, weight trend, vent status.  Assessment:   Patient remains intubated on ventilator support.  MV: 13 Temp:Temp (24hrs), Avg:99.6 F (37.6 C), Min:99 F (37.2 C), Max:100 F (37.8 C)  Received consult for TF initiation and management.  Height: Ht Readings from Last 1 Encounters:  05/24/12 5' 4.5" (1.638 m)    Weight Status:   Wt Readings from Last 1 Encounters:  05/27/12 295 lb 3.1 oz (133.9 kg)  05/26/12  294 lb 1.5 oz (133.4 kg)   Re-estimated needs:  Kcal: 2295 (permissive underfeeding goal: 1375-1610 Protein: >138 gm Fluid: 2.2-2.3 L  Skin: no problems noted  Diet Order: NPO; Osmolite 1.2 at 20 ml/h providing 576 kcals, 27 gm protein, 389 ml free water daily.   Intake/Output Summary (Last 24 hours) at 05/29/12 0915 Last data filed at 05/29/12 0600  Gross per 24 hour  Intake 1778.5 ml  Output   3396 ml  Net -1617.5 ml    Last BM: 2/10   Labs:   Recent Labs Lab 05/27/12 0423 05/28/12 0301 05/29/12 0455  NA 141 141 144  K 4.7 4.3 3.7  CL 107 105 106  CO2 27 29 32  BUN 29* 21 15  CREATININE 1.87* 1.48* 1.19*  CALCIUM 8.6 8.5 8.9  GLUCOSE 90 98 131*    CBG (last 3)   Recent Labs  05/28/12 2344 05/29/12 0343 05/29/12 0755  GLUCAP 126* 118* 141*    Scheduled Meds: . ipratropium  0.5 mg Nebulization Q6H   And  .  albuterol  2.5 mg Nebulization Q6H  . antiseptic oral rinse  15 mL Mouth Rinse QID  . atorvastatin  10 mg Oral q1800  . budesonide  0.5 mg Nebulization BID  . calcium-vitamin D  0.5 tablet Oral Daily  . carvedilol  12.5 mg Per Tube Q12H  . chlorhexidine  15 mL Mouth Rinse BID  . insulin aspart  0-20 Units Subcutaneous Q4H  . olopatadine  1 drop Both Eyes BID  . pantoprazole sodium  40 mg Per Tube Q24H  . piperacillin-tazobactam (ZOSYN)  IV  3.375 g Intravenous Q8H  . vancomycin  1,250 mg Intravenous Q12H    Continuous Infusions: . dextrose 5 % and 0.45% NaCl 20 mL/hr at 05/29/12 0700  . feeding supplement (OSMOLITE 1.2 CAL) 1,000 mL (05/28/12 1228)  . heparin 1,350 Units/hr (05/29/12 0700)    Joaquin Courts, RD, LDN, CNSC Pager# [phone] After Hours Pager# [phone]

## 2012-05-29 NOTE — Progress Notes (Signed)
Pt placed onfull face mask CPAP 13 with a 4L bleed in.  Pt tolerating well at this time.  RT will continue to monitor.

## 2012-05-29 NOTE — Progress Notes (Signed)
UR Completed.  Sharra Cayabyab Jane [phone] 05/29/2012  

## 2012-05-30 ENCOUNTER — Encounter (HOSPITAL_COMMUNITY): Payer: Self-pay | Admitting: Internal Medicine

## 2012-05-30 ENCOUNTER — Inpatient Hospital Stay (HOSPITAL_COMMUNITY): Payer: Medicare Other

## 2012-05-30 ENCOUNTER — Other Ambulatory Visit: Payer: Self-pay | Admitting: Oncology

## 2012-05-30 LAB — COMPREHENSIVE METABOLIC PANEL
ALT: 13 U/L (ref 0–35)
AST: 23 U/L (ref 0–37)
Albumin: 2.5 g/dL — ABNORMAL LOW (ref 3.5–5.2)
Alkaline Phosphatase: 44 U/L (ref 39–117)
Glucose, Bld: 108 mg/dL — ABNORMAL HIGH (ref 70–99)
Potassium: 3.4 mEq/L — ABNORMAL LOW (ref 3.5–5.1)
Sodium: 147 mEq/L — ABNORMAL HIGH (ref 135–145)
Total Protein: 6.9 g/dL (ref 6.0–8.3)

## 2012-05-30 LAB — GLUCOSE, CAPILLARY
Glucose-Capillary: 101 mg/dL — ABNORMAL HIGH (ref 70–99)
Glucose-Capillary: 123 mg/dL — ABNORMAL HIGH (ref 70–99)

## 2012-05-30 LAB — TYPE AND SCREEN
ABO/RH(D): A POS
Antibody Screen: NEGATIVE
Unit division: 0

## 2012-05-30 LAB — CBC
Hemoglobin: 7.7 g/dL — ABNORMAL LOW (ref 12.0–15.0)
MCHC: 27.5 g/dL — ABNORMAL LOW (ref 30.0–36.0)
Platelets: 179 10*3/uL (ref 150–400)
RDW: 23.8 % — ABNORMAL HIGH (ref 11.5–15.5)

## 2012-05-30 LAB — BASIC METABOLIC PANEL
CO2: 33 mEq/L — ABNORMAL HIGH (ref 19–32)
Calcium: 9.3 mg/dL (ref 8.4–10.5)
Chloride: 102 mEq/L (ref 96–112)
Glucose, Bld: 135 mg/dL — ABNORMAL HIGH (ref 70–99)
Potassium: 3.3 mEq/L — ABNORMAL LOW (ref 3.5–5.1)
Sodium: 141 mEq/L (ref 135–145)

## 2012-05-30 LAB — BODY FLUID CULTURE: Culture: NO GROWTH

## 2012-05-30 LAB — MAGNESIUM: Magnesium: 1.8 mg/dL (ref 1.5–2.5)

## 2012-05-30 LAB — HEPARIN LEVEL (UNFRACTIONATED): Heparin Unfractionated: 0.37 IU/mL (ref 0.30–0.70)

## 2012-05-30 MED ORDER — METRONIDAZOLE 500 MG PO TABS
500.0000 mg | ORAL_TABLET | Freq: Three times a day (TID) | ORAL | Status: DC
  Administered 2012-05-30 – 2012-06-06 (×21): 500 mg via ORAL
  Filled 2012-05-30 (×25): qty 1

## 2012-05-30 MED ORDER — FUROSEMIDE 10 MG/ML IJ SOLN
40.0000 mg | Freq: Once | INTRAMUSCULAR | Status: AC
  Administered 2012-05-30: 40 mg via INTRAVENOUS
  Filled 2012-05-30: qty 4

## 2012-05-30 MED ORDER — CHLORHEXIDINE GLUCONATE 0.12 % MT SOLN
15.0000 mL | Freq: Two times a day (BID) | OROMUCOSAL | Status: DC
  Administered 2012-05-31 – 2012-06-05 (×5): 15 mL via OROMUCOSAL
  Filled 2012-05-30 (×5): qty 15

## 2012-05-30 MED ORDER — POTASSIUM CHLORIDE 10 MEQ/50ML IV SOLN
10.0000 meq | INTRAVENOUS | Status: AC
  Administered 2012-05-30 (×2): 10 meq via INTRAVENOUS
  Filled 2012-05-30 (×2): qty 50

## 2012-05-30 MED ORDER — BIOTENE DRY MOUTH MT LIQD
15.0000 mL | Freq: Two times a day (BID) | OROMUCOSAL | Status: DC
  Administered 2012-05-31 – 2012-06-05 (×6): 15 mL via OROMUCOSAL

## 2012-05-30 MED ORDER — AMLODIPINE BESYLATE 10 MG PO TABS
10.0000 mg | ORAL_TABLET | Freq: Every day | ORAL | Status: DC
  Administered 2012-05-30 – 2012-06-06 (×8): 10 mg via ORAL
  Filled 2012-05-30 (×9): qty 1

## 2012-05-30 MED ORDER — FAMOTIDINE 40 MG PO TABS
40.0000 mg | ORAL_TABLET | Freq: Two times a day (BID) | ORAL | Status: DC
  Administered 2012-05-30 – 2012-06-01 (×5): 40 mg via ORAL
  Filled 2012-05-30 (×9): qty 1

## 2012-05-30 NOTE — Progress Notes (Signed)
PULMONARY  / CRITICAL CARE MEDICINE  Name: Melissa Villa MRN: [id number] DOB: 09-06-1945    ADMISSION DATE:  05/24/2012 CONSULTATION DATE:  05/25/2012  REFERRING MD :  Mariana Arn  CHIEF COMPLAINT:  Short or breath  BRIEF PATIENT DESCRIPTION:  67 y/o female former smoker admitted with dry cough, dyspnea, and weakness.  Developed progressive hypoxic/hypercapnic respiratory failure, fever (Tm101.47F) and acute encephalopathy.  Transferred to ICU 2/06 and PCCM assumed medical care.   She was in hospital in December 2013 for PNA, Rt Empyema, AECOPD, and CHF exacerbation.  Significant PMHx of COPD on home oxygen, OSA/OHS, AV block s/p PM, Diastolic CHF, DM type II, Neuropathy, s/p RLLectomy for carcinoid, Breast cancer  SIGNIFICANT EVENTS: 2/05 -  Admit 2/06 - Transfer to ICU, VDRF 2/08 - Tolerating PS wean 5/5 40% 2/10- extubated  STUDIES:  2/06 Echo >> EF 60 to 65%, grade 1 diastolic dysfx, PAS 104 mmHg 2/06 CT chest >> mod Rt effusion, small Lt effusion  LINES / TUBES: ETT 2/06 >>2/10 Rt IJ CVL 2/06 >>  L Chest Port >>> R Chest Tube 2/7 >>  CULTURES: Blood 2/06 >>  No growth 5 days Sputum 2/06>> Canceled UC 2/6>>>neg Rt pleural fluid 2/7>> No growth 3 days, rare WBC  ANTIBIOTICS: Vancomycin 2/06 >>2/10 Zosyn 2/06 >> 2/10 ceftriaxone 2/10>>>2/13  SUBJECTIVE:  Extubated, no distress, neg 2 lit plus  VITAL SIGNS: Temp:  [98.6 F (37 C)-100.2 F (37.9 C)] 99.4 F (37.4 C) (02/11 1100) Pulse Rate:  [58-98] 74 (02/11 1100) Resp:  [19-36] 22 (02/11 1100) BP: (117-161)/(49-106) 130/49 mmHg (02/11 1100) SpO2:  [88 %-100 %] 98 % (02/11 1100) FiO2 (%):  [40 %] 40 % (02/10 1300) Weight:  [125.2 kg (276 lb 0.3 oz)] 125.2 kg (276 lb 0.3 oz) (02/11 0555)  HEMODYNAMICS: CVP:  [4 mmHg] 4 mmHg  VENTILATOR SETTINGS: Vent Mode:  [-]  FiO2 (%):  [40 %] 40 %  INTAKE / OUTPUT: Intake/Output     02/10 0701 - 02/11 0700 02/11 0701 - 02/12 0700   I.V. (mL/kg) 1167 (9.3)     NG/GT 160    IV Piggyback 50    Total Intake(mL/kg) 1377 (11)    Urine (mL/kg/hr) 3350 (1.1)    Stool 5 (0)    Chest Tube 150 (0)    Total Output 3505     Net -2128            PHYSICAL EXAMINATION: General:  No distress at rest in chair Neuro:  Follows commands, awake, alert HEENT:  obese Cardiovascular:  s1s2 regular, no murmur Lungs:  B/l rhonchi,R CT x1 Abdomen:  Obese, soft, decreased bowel sounds Musculoskeletal:  No edema Skin: post-XRT changes Rt chest  LABS:  Recent Labs Lab 05/24/12 1211 05/25/12 0535  05/25/12 1930  05/25/12 2053 05/25/12 2359 05/26/12 0315  05/27/12 2121 05/28/12 0301 05/28/12 0857 05/29/12 0455 05/30/12 0443  HGB 7.5*  --   --   --   --   --   --   --   < >  --  7.3*  --  7.5* 7.7*  WBC 9.5  --   --   --   --   --   --   --   < >  --  8.7  --  7.4 6.7  PLT 211  --   --   --   --   --   --   --   < >  --  162  --  151 179  NA 141 140  --   --   --   --   --   --   < >  --  141  --  144 147*  K 5.5* 5.1  --   --   --   --   --   --   < >  --  4.3  --  3.7 3.4*  CL 106 106  --   --   --   --   --   --   < >  --  105  --  106 104  CO2 25 27  --   --   --   --   --   --   < >  --  29  --  32 31  GLUCOSE 116* 131*  --   --   --   --   --   --   < >  --  98  --  131* 108*  BUN 25* 27*  --   --   --   --   --   --   < >  --  21  --  15 10  CREATININE 1.62* 1.81*  --   --   --   --   --   --   < >  --  1.48*  --  1.19* 0.90  CALCIUM 9.0 8.9  --   --   --   --   --   --   < >  --  8.5  --  8.9 9.2  AST 19 14  15   --   --   --   --   --   --   --   --   --   --   --  23  ALT 9 7  8   --   --   --   --   --   --   --   --   --   --   --  13  ALKPHOS 51 54  51  --   --   --   --   --   --   --   --   --   --   --  44  BILITOT 0.1* 0.1*  0.2*  --   --   --   --   --   --   --   --   --   --   --  0.2*  PROT 7.2 7.2  7.2  --   --   --   --   --   --   --   --   --   --   --  6.9  ALBUMIN 2.8* 2.9*  2.8*  --   --   --   --   --   --   --   --   --    --   --  2.5*  LATICACIDVEN  --   --   --  1.0  --   --   --   --   --   --   --   --   --   --   TROPONINI  --   --   --   --   --   --   --   --   --  <0.30 <0.30 <0.30  --   --   PROBNP 442.0*  --   --   --   --   --   --   --   --   --   --   --   --   --  PHART  --   --   < >  --   < > 7.253* 7.308* 7.329*  --   --   --   --   --   --   PCO2ART  --   --   < >  --   < > 64.5* 54.8* 51.0*  --   --   --   --   --   --   PO2ART  --   --   < >  --   < > 257.0* 76.4* 90.0  --   --   --   --   --   --   < > = values in this interval not displayed.  Recent Labs Lab 05/29/12 1531 05/29/12 2047 05/29/12 2302 05/30/12 0337 05/30/12 0849  GLUCAP 140* 100* 101* 101* 123*    Imaging:  Dg Chest Port 1 View  05/30/2012  *RADIOLOGY REPORT*  Clinical Data: Right effusion.  Respiratory distress.  PORTABLE CHEST - 1 VIEW  Comparison: 05/29/2012  Findings: Interval removal of endotracheal tube and NG tube.  Right chest pigtail catheter and left Port-A-Cath remain in place, unchanged.  No pneumothorax.  Bilateral airspace disease, right greater than left with small right effusion.  Stable cardiomegaly. No real change since prior study.  IMPRESSION: Interval extubation.  Otherwise no change.  No pneumothorax.   Original Report Authenticated By: Charlett Nose, M.D.    Dg Chest Port 1 View  05/29/2012  *RADIOLOGY REPORT*  Clinical Data: Pneumonia  PORTABLE CHEST - 1 VIEW  Comparison: Yesterday  Findings: Borderline cardiomegaly.  Bilateral pulmonary edema improved.  Right pleural effusion improved.  Right chest tube remains in place.  Right internal jugular vein central venous catheter and left internal jugular vein Port-A-Cath unchanged. Endotracheal and NG tubes stable.  No pneumothorax.  IMPRESSION: Improved edema.  Improved right pleural effusion.   Original Report Authenticated By: Jolaine Click, M.D.      ASSESSMENT / PLAN:  PULMONARY A: Acute on chronic respiratory failure 2nd to PNA and recurrent  Rt pleural effusion. Secondary pulmonary hypertension - PA Pressure 104 on ECHO 2/6 Hx of COPD. Hx of OSA/OHS. P:   IS When output less 150 / 24 hrs, consider dc Chest tube, keep to water seal CXR tomorrow AM Continue lasix I do not see eval of this fluid or cytology, just micro which is neg final growth  CARDIOVASCULAR A: Hx of CAD, HTN, Diastolic CHF. Hypotension after intubation 2/06 >> Resolved 2/07. P:  continue lasix Unable to dc line neck yet on heparin, see heme Lipitor, coreg Restart amlodipine  RENAL A:  Acute on chronic renal failure >> likely from diuresis. Improved. Hyperkalemia >> resolved. P:   K supp kvo Lasix repeat  GASTROINTESTINAL A:  Morbid obesity. Nutrition. Stools increased P:   Diet advance carb mod ppi Cdiff, if neg immodium  HEMATOLOGIC A:  Anemia of chronic disease and critical illness. PRBC transfusion 2/07.  No current evidence for bleeding. Hx of Breast cancer. thrombocytopenia P:  Plat  Rising Heparin drip, coumadin once Chest tube out Lasix to hemoconcetrate  INFECTIOUS A:  HCAP.culture neg, exudative effusion P:   Add ceftriaxone , add stop date 13th  ENDOCRINE A:  DM type II with hyperglycemia. P:   SSI, currently well controlled  NEUROLOGIC A:  Acute encephalopathy 2nd to hypercapnia. Improved 2/07.  Off gtts 2/8.  P:   Pt active  SUMMARY:  67 y/o F with pleural effusion on vent s/p CT  per IR.  RLE DVT on heparin gtt.  Gentle diuresis 2/8-2/10 and hopeful for extubation on 2/10. Teaching service has discussed with pt's family.  They are agreeable to trial of intubation.  They would not want CPR/Defibrillation.  Extubated, can go out, will call TS as primary . Will stay on as pulm conuslt for lung / effusion. Heparin , coum soon  To floor, pulm stay consult, top prim TS   Mcarthur Rossetti. Tyson Alias, MD, FACP Pgr: [phone] [facility]

## 2012-05-30 NOTE — Progress Notes (Signed)
ANTICOAGULATION CONSULT NOTE - FOLLOW UP  Pharmacy Consult:  Heparin Indication:  Right leg DVT  No Known Allergies  Patient Measurements: Height: 5' 4.5" (163.8 cm) Weight: 276 lb 0.3 oz (125.2 kg) IBW/kg (Calculated) : 55.85 Heparin Dosing Weight: 88 kg  Vital Signs: Temp: 99.4 F (37.4 C) (02/11 1100) Temp src: Core (Comment) (02/11 1100) BP: 130/49 mmHg (02/11 1100) Pulse Rate: 74 (02/11 1100)  Labs:  Recent Labs  05/27/12 2121  05/28/12 0301 05/28/12 0857 05/29/12 0455 05/29/12 1200 05/30/12 0443  HGB  --   < > 7.3*  --  7.5*  --  7.7*  HCT  --   --  25.7*  --  27.1*  --  28.0*  PLT  --   --  162  --  151  --  179  HEPARINUNFRC  --   < > 0.36  --  0.25* 0.40 0.37  CREATININE  --   --  1.48*  --  1.19*  --  0.90  TROPONINI <0.30  --  <0.30 <0.30  --   --   --   < > = values in this interval not displayed.  Estimated Creatinine Clearance: 83.3 ml/min (by C-G formula based on Cr of 0.9).  Assessment: 67 YO female with DVT on heparin. Heparin level therapeutic. No bleeding noted. Plt stable today.  Goal of Therapy:  HL 0.3 - 0.7 units/mL Monitor platelets by anticoagulation protocol: Yes  Plan:  1) Continue Heparin 1350 units/hr 2) Daily heparin level and CBC  Christoper Fabian, PharmD, BCPS Clinical pharmacist, pager [phone] 05/30/2012, 11:43 AM

## 2012-05-30 NOTE — Progress Notes (Signed)
C. Diff pos  Flagyl ordered po per severity protocol. Per rounding team to adjust antibiotic regimen.   Protonix discontinued, Famotidine ordered for SUP.

## 2012-05-30 NOTE — Progress Notes (Signed)
Subjective: Pt sitting up in chair; feeling a little better; still has some dyspnea when recumbent; occ cough  Objective: Vital signs in last 24 hours: Temp:  [98.6 F (37 C)-100.2 F (37.9 C)] 98.6 F (37 C) (02/11 0850) Pulse Rate:  [58-90] 58 (02/11 0700) Resp:  [16-36] 33 (02/11 0700) BP: (117-161)/(54-106) 145/63 mmHg (02/11 0700) SpO2:  [88 %-100 %] 100 % (02/11 1008) FiO2 (%):  [40 %] 40 % (02/10 1300) Weight:  [276 lb 0.3 oz (125.2 kg)] 276 lb 0.3 oz (125.2 kg) (02/11 0555) Last BM Date: 05/24/12  Intake/Output from previous day: 02/10 0701 - 02/11 0700 In: 1377 [I.V.:1167; NG/GT:160; IV Piggyback:50] Out: 3505 [Urine:3350; Stool:5; Chest Tube:150] Intake/Output this shift:    Rt chest drain intact, output about 100 cc's per shift amber fluid, cx's pend, no air leak  Lab Results:   Recent Labs  05/29/12 0455 05/30/12 0443  WBC 7.4 6.7  HGB 7.5* 7.7*  HCT 27.1* 28.0*  PLT 151 179   BMET  Recent Labs  05/29/12 0455 05/30/12 0443  NA 144 147*  K 3.7 3.4*  CL 106 104  CO2 32 31  GLUCOSE 131* 108*  BUN 15 10  CREATININE 1.19* 0.90  CALCIUM 8.9 9.2   PT/INR No results found for this basename: LABPROT, INR,  in the last 72 hours ABG No results found for this basename: PHART, PCO2, PO2, HCO3,  in the last 72 hours  Studies/Results: Dg Chest Port 1 View  05/30/2012  *RADIOLOGY REPORT*  Clinical Data: Right effusion.  Respiratory distress.  PORTABLE CHEST - 1 VIEW  Comparison: 05/29/2012  Findings: Interval removal of endotracheal tube and NG tube.  Right chest pigtail catheter and left Port-A-Cath remain in place, unchanged.  No pneumothorax.  Bilateral airspace disease, right greater than left with small right effusion.  Stable cardiomegaly. No real change since prior study.  IMPRESSION: Interval extubation.  Otherwise no change.  No pneumothorax.   Original Report Authenticated By: Charlett Nose, M.D.    Dg Chest Port 1 View  05/29/2012  *RADIOLOGY  REPORT*  Clinical Data: Pneumonia  PORTABLE CHEST - 1 VIEW  Comparison: Yesterday  Findings: Borderline cardiomegaly.  Bilateral pulmonary edema improved.  Right pleural effusion improved.  Right chest tube remains in place.  Right internal jugular vein central venous catheter and left internal jugular vein Port-A-Cath unchanged. Endotracheal and NG tubes stable.  No pneumothorax.  IMPRESSION: Improved edema.  Improved right pleural effusion.   Original Report Authenticated By: Jolaine Click, M.D.     Anti-infectives: Anti-infectives   Start     Dose/Rate Route Frequency Ordered Stop   05/29/12 1400  cefTRIAXone (ROCEPHIN) 1 g in dextrose 5 % 50 mL IVPB     1 g 100 mL/hr over 30 Minutes Intravenous Every 24 hours 05/29/12 1215 06/02/12 1359   05/28/12 1200  vancomycin (VANCOCIN) 1,250 mg in sodium chloride 0.9 % 250 mL IVPB  Status:  Discontinued     1,250 mg 166.7 mL/hr over 90 Minutes Intravenous Every 12 hours 05/28/12 1051 05/29/12 1214   05/27/12 2000  vancomycin (VANCOCIN) 1,750 mg in sodium chloride 0.9 % 500 mL IVPB  Status:  Discontinued     1,750 mg 250 mL/hr over 120 Minutes Intravenous Every 48 hours 05/25/12 1858 05/27/12 1111   05/27/12 1200  vancomycin (VANCOCIN) 1,750 mg in sodium chloride 0.9 % 500 mL IVPB  Status:  Discontinued     1,750 mg 250 mL/hr over 120 Minutes Intravenous Every 48  hours 05/27/12 1110 05/28/12 1050   05/25/12 2200  piperacillin-tazobactam (ZOSYN) IVPB 3.375 g  Status:  Discontinued     3.375 g 12.5 mL/hr over 240 Minutes Intravenous 3 times per day 05/25/12 1858 05/29/12 1214   05/25/12 2000  vancomycin (VANCOCIN) 2,500 mg in sodium chloride 0.9 % 500 mL IVPB     2,500 mg 250 mL/hr over 120 Minutes Intravenous  Once 05/25/12 1858 05/25/12 2200      Assessment/Plan: s/p rt pleural effusion drainage 2/7; cont current tx as per CCM, check final pleural fluid cx's  Muaaz Brau,D [facility] 05/30/2012

## 2012-05-30 NOTE — Progress Notes (Signed)
Physician on call notified of patient's EKG rhythm with frequent bigeminy and PVC's. Orders written for labs. Will continue to monitor.

## 2012-05-30 NOTE — Progress Notes (Signed)
Patient has her home cpap unit.  Does not want to wear ours.  Unit is in good condition and wire is intact.  Will get biomed to check machine in the morning.  Patient placed herself on CPAP already and has 4LPM oxygen bled in.  RT will continue to monitor patient.

## 2012-05-30 NOTE — Progress Notes (Signed)
e[facility] Nursing ICU Electrolyte Replacement Protocol  Patient Name: SYNDI PUA DOB: 12-11-1945 MRN: [id number]  Date of Service  05/30/2012   HPI/Events of Note  Replaced per protocol  Recent Labs Lab 05/26/12 0500 05/27/12 0423 05/28/12 0301 05/29/12 0455 05/30/12 0443  NA 140 141 141 144 147*  K 5.1 4.7 4.3 3.7 3.4*  CL 106 107 105 106 104  CO2 27 27 29  32 31  GLUCOSE 77 90 98 131* 108*  BUN 35* 29* 21 15 10   CREATININE 2.47* 1.87* 1.48* 1.19* 0.90  CALCIUM 8.7 8.6 8.5 8.9 9.2    Estimated Creatinine Clearance: 86.8 ml/min (by C-G formula based on Cr of 0.9).  Intake/Output     02/10 0701 - 02/11 0700   I.V. (mL/kg) 1060 (7.9)   NG/GT 160   IV Piggyback 50   Total Intake(mL/kg) 1270 (9.5)   Urine (mL/kg/hr) 2700 (0.8)   Stool 3 (0)   Chest Tube 50 (0)   Total Output 2753   Net -1483        - I/O DETAILED x24h    Total I/O In: 535 [I.V.:535] Out: 301 [Urine:300; Stool:1] - I/O THIS SHIFT    ASSESSMENT   eICURN Interventions     ASSESSMENT: MAJOR ELECTROLYTE    Merita Norton 05/30/2012, 5:49 AM

## 2012-05-30 NOTE — Progress Notes (Signed)
Frequent PVCs   Cont cardiac monitoring; obtain electrolytes

## 2012-05-30 NOTE — Progress Notes (Signed)
CRITICAL VALUE ALERT  Critical value received:  C Diff Positive  Date of notification:  05/30/12  Time of notification:  1640  Critical value read back:yes  Nurse who received alert:  Alto Denver, RN  MD notified (1st page):  Dr. Frederico Hamman  Time of first page:  1642  MD notified (2nd page):  Time of second page:  Responding MD:  Dr. Frederico Hamman  Time MD responded:  [phone]

## 2012-05-30 NOTE — Clinical Social Work Psychosocial (Signed)
     Clinical Social Work Department BRIEF PSYCHOSOCIAL ASSESSMENT 05/30/2012  Patient:  Melissa Villa, Melissa Villa     Account Number:  [id number]     Admit date:  05/24/2012  Clinical Social Worker:  Margaree Mackintosh  Date/Time:  05/30/2012 03:27 PM  Referred by:  Physician  Date Referred:  05/30/2012 Referred for  SNF Placement   Other Referral:   Interview type:  Patient Other interview type:    PSYCHOSOCIAL DATA Living Status:  ALONE Admitted from facility:   Level of care:   Primary support name:  Derwood Kaplan: [phone] Primary support relationship to patient:  CHILD, ADULT Degree of support available:   Unknown.    CURRENT CONCERNS Current Concerns  Post-Acute Placement   Other Concerns:    SOCIAL WORK ASSESSMENT / PLAN Clinical Social Worker met with pt at bedside.  CSW introduced self, explained role, and provided support.  CSW reviewed current PT recommendations.  At this time, pt is adamant to return home with home health.  Pt states she may be able to locate friends and/or family to assist with her care at home at dc.  CSW to sign off at this time, please re consult if needed.  CSW updated RNCM.   Assessment/plan status:  Information/Referral to [facility] Other assessment/ plan:   Information/referral to community resources:   SNF  [facility]    PATIENTS/FAMILYS RESPONSE TO PLAN OF CARE: Pt was pleasant and engaged in conversation.  Pt referrenced money being a stressor and contributor to her decision.

## 2012-05-30 NOTE — Evaluation (Signed)
Physical Therapy Evaluation Patient Details Name: Melissa Villa MRN: [id number] DOB: [birth year] Today's Date: 05/30/2012 Time: 4098-1191 PT Time Calculation (min): 40 min  PT Assessment / Plan / Recommendation Clinical Impression  Pt s/p pleural effusion, PNA, VDRF and encephalopathy with decr mobility secondary to decr endurance and decr balance.  Will benefit from PT to address endurance and balance issues.  May need NHP for therapy at d/c as pt is very weak.      PT Assessment  Patient needs continued PT services    Follow Up Recommendations  SNF;Supervision/Assistance - 24 hour       Barriers to Discharge Decreased caregiver support      Equipment Recommendations  None recommended by PT         Frequency Min 3X/week    Precautions / Restrictions Precautions Precautions: Fall Restrictions Weight Bearing Restrictions: No   Pertinent Vitals/Pain VSS, no pain      Mobility  Bed Mobility Bed Mobility: Rolling Left;Left Sidelying to Sit;Sitting - Scoot to [place] of Bed Rolling Left: 4: Min assist;With rail Left Sidelying to Sit: 4: Min assist;With rails;HOB elevated Sitting - Scoot to [place] of Bed: 4: Min assist Details for Bed Mobility Assistance: min assist for elevation of trunk, incr time for pt to get to EOB with pt using momentum Transfers Transfers: Sit to Stand;Stand to [employer] Transfers Sit to Stand: 4: Min assist;With upper extremity assist;With armrests;From bed Stand to Sit: 4: Min guard;With upper extremity assist;With armrests;To chair/3-in-1 Stand Pivot Transfers: 4: Min assist Details for Transfer Assistance: Steadying assist given as pt states that her LEs feel very weak and she felt unsteady.  Took steps around with widened BOS with ability to weight shift bil directions while holding RW.   Ambulation/Gait Ambulation/Gait Assistance: Not tested (comment) Stairs: No Wheelchair Mobility Wheelchair Mobility: No    Exercises General Exercises -  Lower Extremity Long Arc Quad: AROM;Both;10 reps;Seated Hip Flexion/Marching: AROM;Both;10 reps;Seated   PT Diagnosis: Generalized weakness  PT Problem List: Decreased activity tolerance;Decreased strength;Decreased balance;Decreased mobility;Obesity;Decreased knowledge of precautions;Decreased safety awareness;Decreased knowledge of use of DME PT Treatment Interventions: DME instruction;Gait training;Stair training;Functional mobility training;Therapeutic activities;Therapeutic exercise;Balance training;Patient/family education   PT Goals Acute Rehab PT Goals PT Goal Formulation: With patient Time For Goal Achievement: 06/13/12 Potential to Achieve Goals: Good Pt will go Supine/Side to Sit: with modified independence PT Goal: Supine/Side to Sit - Progress: Goal set today Pt will go Sit to Stand: with modified independence;with upper extremity assist PT Goal: Sit to Stand - Progress: Goal set today Pt will Ambulate: 16 - 50 feet;with modified independence;with least restrictive assistive device PT Goal: Ambulate - Progress: Goal set today Pt will Go Up / Down Stairs: 3-5 stairs;with min assist;with least restrictive assistive device PT Goal: Up/Down Stairs - Progress: Goal set today Pt will Perform Home Exercise Program: with supervision, verbal cues required/provided PT Goal: Perform Home Exercise Program - Progress: Goal set today  Visit Information  Last PT Received On: 05/30/12 Assistance Needed: +1    Subjective Data  Subjective: "I don't understand why I never get stronger." Patient Stated Goal: To get stronger   Prior Functioning  Home Living Lives With: Alone Available Help at Discharge: Family;Available PRN/intermittently;Other (Comment) (son/daugher in law help with transport and meals; they work) Type of Home: House Home Access: Stairs to enter Secretary/administrator of Steps: 5 Entrance Stairs-Rails: Right Home Layout: One level Bathroom Toilet: Standard Home  Adaptive Equipment: Bedside commode/3-in-1;Shower chair with back;Walker - rolling;Wheelchair -  manual Additional Comments: Pt states daughter in law cooked her meals and son provided transport Prior Function Level of Independence: Independent with assistive device(s);Needs assistance Needs Assistance: Meal Prep;Light Housekeeping Meal Prep: Total Light Housekeeping: Total Able to Take Stairs?: Yes Driving: No Vocation: Retired Musician: No difficulties    Copywriter, advertising Overall Cognitive Status: Appears within functional limits for tasks assessed/performed Arousal/Alertness: Awake/alert Orientation Level: Appears intact for tasks assessed Behavior During Session: [facility] for tasks performed    Extremity/Trunk Assessment Right Lower Extremity Assessment RLE ROM/Strength/Tone: [facility] for tasks assessed;Deficits RLE ROM/Strength/Tone Deficits: hip 3-/5 Left Lower Extremity Assessment LLE ROM/Strength/Tone: [facility] for tasks assessed;Deficits LLE ROM/Strength/Tone Deficits: hip 3-/5 Trunk Assessment Trunk Assessment: Normal   Balance Balance Balance Assessed: Yes Dynamic Sitting Balance Dynamic Sitting - Balance Support: Bilateral upper extremity supported;Feet supported;During functional activity Dynamic Sitting - Level of Assistance: 5: Stand by assistance Dynamic Sitting Balance - Compensations: No difficulties with sitting at EOB up to 5 minutes Dynamic Sitting - Balance Activities: Lateral lean/weight shifting;Forward lean/weight shifting;Reaching across midline Static Standing Balance Static Standing - Balance Support: Bilateral upper extremity supported;During functional activity Static Standing - Level of Assistance: 4: Min assist Static Standing - Comment/# of Minutes: Stands with min assist needed for static stance with RW  End of Session PT - End of Session Equipment Utilized During Treatment: Gait belt;Oxygen Activity Tolerance: Patient tolerated  treatment well Patient left: in chair;with call bell/phone within reach;with nursing in room Nurse Communication: Mobility status       INGOLD,Dalphine Cowie 05/30/2012, 10:03 AM [facility] [phone] (pager)

## 2012-05-31 ENCOUNTER — Inpatient Hospital Stay (HOSPITAL_COMMUNITY): Payer: Medicare Other

## 2012-05-31 DIAGNOSIS — I5030 Unspecified diastolic (congestive) heart failure: Secondary | ICD-10-CM

## 2012-05-31 DIAGNOSIS — I82409 Acute embolism and thrombosis of unspecified deep veins of unspecified lower extremity: Secondary | ICD-10-CM

## 2012-05-31 DIAGNOSIS — A0472 Enterocolitis due to Clostridium difficile, not specified as recurrent: Secondary | ICD-10-CM

## 2012-05-31 LAB — CBC
Hemoglobin: 8 g/dL — ABNORMAL LOW (ref 12.0–15.0)
MCH: 19.8 pg — ABNORMAL LOW (ref 26.0–34.0)
Platelets: 171 10*3/uL (ref 150–400)
RBC: 4.04 MIL/uL (ref 3.87–5.11)
WBC: 6.5 10*3/uL (ref 4.0–10.5)

## 2012-05-31 LAB — BASIC METABOLIC PANEL
BUN: 8 mg/dL (ref 6–23)
CO2: 32 mEq/L (ref 19–32)
Chloride: 103 mEq/L (ref 96–112)
Creatinine, Ser: 0.86 mg/dL (ref 0.50–1.10)
Potassium: 3.3 mEq/L — ABNORMAL LOW (ref 3.5–5.1)

## 2012-05-31 LAB — GLUCOSE, CAPILLARY
Glucose-Capillary: 135 mg/dL — ABNORMAL HIGH (ref 70–99)
Glucose-Capillary: 140 mg/dL — ABNORMAL HIGH (ref 70–99)
Glucose-Capillary: 216 mg/dL — ABNORMAL HIGH (ref 70–99)
Glucose-Capillary: 176 mg/dL — ABNORMAL HIGH (ref 70–99)

## 2012-05-31 LAB — HEPARIN LEVEL (UNFRACTIONATED): Heparin Unfractionated: 0.29 IU/mL — ABNORMAL LOW (ref 0.30–0.70)

## 2012-05-31 LAB — PHOSPHORUS: Phosphorus: 2.8 mg/dL (ref 2.3–4.6)

## 2012-05-31 LAB — PROTEIN, BODY FLUID

## 2012-05-31 LAB — BODY FLUID CELL COUNT WITH DIFFERENTIAL
Lymphs, Fluid: 86 %
Neutrophil Count, Fluid: 4 % (ref 0–25)

## 2012-05-31 LAB — GLUCOSE, SEROUS FLUID: Glucose, Fluid: 132 mg/dL

## 2012-05-31 MED ORDER — CALCIUM CARBONATE-VITAMIN D 500-200 MG-UNIT PO TABS
1.0000 | ORAL_TABLET | Freq: Every day | ORAL | Status: DC
  Administered 2012-05-31 – 2012-06-06 (×7): 1 via ORAL
  Filled 2012-05-31 (×7): qty 1

## 2012-05-31 MED ORDER — HEPARIN (PORCINE) IN NACL 100-0.45 UNIT/ML-% IJ SOLN
1450.0000 [IU]/h | INTRAMUSCULAR | Status: DC
  Administered 2012-05-31: 1450 [IU]/h via INTRAVENOUS
  Filled 2012-05-31: qty 250

## 2012-05-31 MED ORDER — IBUPROFEN 800 MG PO TABS
800.0000 mg | ORAL_TABLET | Freq: Two times a day (BID) | ORAL | Status: DC | PRN
  Administered 2012-05-31 – 2012-06-01 (×2): 800 mg via ORAL
  Filled 2012-05-31 (×4): qty 1

## 2012-05-31 MED ORDER — IBUPROFEN 800 MG PO TABS: 800.0000 mg | ORAL_TABLET | Freq: Four times a day (QID) | ORAL | Status: DC | PRN

## 2012-05-31 MED ORDER — HYDROCOD POLST-CHLORPHEN POLST 10-8 MG/5ML PO LQCR
5.0000 mL | Freq: Two times a day (BID) | ORAL | Status: AC | PRN
  Administered 2012-05-31: 5 mL via ORAL

## 2012-05-31 MED ORDER — ENOXAPARIN SODIUM 120 MG/0.8ML ~~LOC~~ SOLN
120.0000 mg | Freq: Two times a day (BID) | SUBCUTANEOUS | Status: DC
  Administered 2012-05-31 – 2012-06-06 (×13): 120 mg via SUBCUTANEOUS
  Filled 2012-05-31 (×14): qty 0.8

## 2012-05-31 MED ORDER — ENOXAPARIN SODIUM 120 MG/0.8ML ~~LOC~~ SOLN
120.0000 mg | Freq: Two times a day (BID) | SUBCUTANEOUS | Status: DC
  Filled 2012-05-31 (×2): qty 0.8

## 2012-05-31 MED ORDER — FUROSEMIDE 10 MG/ML IJ SOLN
40.0000 mg | Freq: Once | INTRAMUSCULAR | Status: AC
  Administered 2012-05-31: 40 mg via INTRAVENOUS
  Filled 2012-05-31: qty 4

## 2012-05-31 MED ORDER — HYDROCOD POLST-CHLORPHEN POLST 10-8 MG/5ML PO LQCR
5.0000 mL | Freq: Two times a day (BID) | ORAL | Status: DC | PRN
  Filled 2012-05-31: qty 5

## 2012-05-31 MED ORDER — POTASSIUM CHLORIDE CRYS ER 20 MEQ PO TBCR
40.0000 meq | EXTENDED_RELEASE_TABLET | Freq: Two times a day (BID) | ORAL | Status: DC
  Administered 2012-05-31 – 2012-06-03 (×8): 40 meq via ORAL
  Filled 2012-05-31 (×11): qty 2

## 2012-05-31 MED ORDER — IBUPROFEN 200 MG PO TABS
800.0000 mg | ORAL_TABLET | Freq: Four times a day (QID) | ORAL | Status: AC | PRN
  Administered 2012-06-01 – 2012-06-06 (×2): 800 mg via ORAL

## 2012-05-31 NOTE — Care Management (Signed)
Pt placed on cpap for the night with 4lt bleed in FIO2. Pt tol well

## 2012-05-31 NOTE — Progress Notes (Signed)
Internal Medicine Attending  Date: 05/31/2012  Patient name: Melissa Villa Medical record number: [id number] Date of birth: 12-31-1945 Age: 67 y.o. Gender: female  I saw and evaluated the patient. I reviewed the resident's note by Dr. Sherrine Maples [name] agree with the resident's findings and plans as documented in her progress note.  [name] notes that her breathing is much improved from admission but is still not at baseline. She was up in a chair today and when I saw her was up back on her CPAP. Her only other new complaint was tenderness to palpation of the right arm. Examination revealed no bruising, rashes, or edema. We will continue to try to investigate why she has the exudative loculated pleural effusion on the right. Cytology was sent today albeit from the pleural vac. We will also continue to diurese her given her diastolic heart failure. With underlying breast cancer, studies suggest that Lovenox may be a better anticoagulant choice because of improved survival. Finally we will continue the Flagyl for her C. difficile treatment, which is symptomatically much improved at this time.

## 2012-05-31 NOTE — Progress Notes (Signed)
Resident Co-sign Daily Note: I have seen the patient and reviewed the daily progress note by Jaye Beagle, MS-III and discussed the care of the patient with her.  See my separate note for documentation of my findings, assessment, and plans.    LOS: 7 days   Genelle Gather 05/31/2012, 1:48 PM

## 2012-05-31 NOTE — Progress Notes (Addendum)
ANTICOAGULATION CONSULT NOTE - Follow Up Consult  Pharmacy Consult for Heparin Indication: Right Leg DVT  No Known Allergies  Patient Measurements: Height: 5' 4.5" (163.8 cm) Weight: 276 lb 0.3 oz (125.2 kg) IBW/kg (Calculated) : 55.85 Heparin Dosing Weight: 88 kg  Vital Signs: Temp: 99.6 F (37.6 C) (02/12 0658) Temp src: Oral (02/12 0658) BP: 155/63 mmHg (02/12 0658) Pulse Rate: 81 (02/12 0658)  Labs:  Recent Labs  05/28/12 0857  05/29/12 0455 05/29/12 1200 05/30/12 0443 05/30/12 2111 05/31/12 0505  HGB  --   < > 7.5*  --  7.7*  --  8.0*  HCT  --   --  27.1*  --  28.0*  --  29.4*  PLT  --   --  151  --  179  --  171  HEPARINUNFRC  --   < > 0.25* 0.40 0.37  --  0.29*  CREATININE  --   < > 1.19*  --  0.90 0.85 0.86  TROPONINI <0.30  --   --   --   --   --   --   < > = values in this interval not displayed.  Estimated Creatinine Clearance: 87.2 ml/min (by C-G formula based on Cr of 0.86).  Assessment: 68 YOF with right leg DVT on IV heparin. Heparin level is slightly sub-therapeutic this am. Hgb is low but stable. Platelets are stable. No bleeding per RN and no problems with infusion.   Goal of Therapy:  Heparin level 0.3-0.7 units/ml Monitor platelets by anticoagulation protocol: Yes   Plan:  1) Increase Heparin to 1450 units/hr (increase of ~1unit/kg/hr). 2) Daily heparin level and CBC  Link Snuffer, PharmD, BCPS Clinical Pharmacist [phone] 05/31/2012,8:04 AM  Received MD orders for Lovenox 120mg  SQ bid starting 1200 today. RN made aware she needs to discontinue heparin drip now. Dose is ok for patient's weight and renal function.   - Will d/c any pending heparin levels - Will f/up CBC q 72h - Will f/up long-term anticoag plans  Thanks, Rosy Estabrook K. Allena Katz, PharmD, BCPS.  Clinical Pharmacist Pager [phone]. 05/31/2012 11:11 AM

## 2012-05-31 NOTE — Progress Notes (Signed)
Medical Student Daily Progress Note  Subjective: Mood seems to be a little down this AM, as she had just discussed possible etiologies of her pleural effusion with other members of team.  Pt feels breathing has improved on [place] during day and CPAP with O2 at night.  Decreased appetite, but has been trying to improve PO intake.  UOP good.  Objective: Vital signs in last 24 hours: Filed Vitals:   05/30/12 2042 05/30/12 2105 05/30/12 2144 05/31/12 0658  BP: 147/58  150/65 155/63  Pulse: 90 87 88 81  Temp: 97.6 F (36.4 C)  98.8 F (37.1 C) 99.6 F (37.6 C)  TempSrc: Oral  Oral Oral  Resp: 22 18 22 22   Height:      Weight:      SpO2: 96% 97% 95% 93%   Weight change:   Intake/Output Summary (Last 24 hours) at 05/31/12 0942 Last data filed at 05/31/12 0659  Gross per 24 hour  Intake 1111.23 ml  Output   2396 ml  Net -1284.77 ml   Physical Exam: BP 155/63  Pulse 81  Temp(Src) 99.6 F (37.6 C) (Oral)  Resp 22  Ht 5' 4.5" (1.638 m)  Wt 125.2 kg (276 lb 0.3 oz)  BMI 46.66 kg/m2  SpO2 93% General appearance: alert, cooperative and no distress Lungs: Course BS throughout with scattered expiratory wheezes, Diminished bibasilar BS Heart: regular rate and rhythm, S1, S2 normal, no murmur, click, rub or gallop Abdomen: soft, non-tender; bowel sounds normal; no masses,  no organomegaly Extremities: Trace BLE edema  Lab Results: Results for orders placed during the hospital encounter of 05/24/12 (from the past 24 hour(s))  GLUCOSE, CAPILLARY     Status: Abnormal   Collection Time    05/30/12 12:13 PM      Result Value Range   Glucose-Capillary 114 (*) 70 - 99 mg/dL  CLOSTRIDIUM DIFFICILE BY PCR     Status: Abnormal   Collection Time    05/30/12  2:44 PM      Result Value Range   C difficile by pcr POSITIVE (*) NEGATIVE  GLUCOSE, CAPILLARY     Status: Abnormal   Collection Time    05/30/12  4:03 PM      Result Value Range   Glucose-Capillary 138 (*) 70 - 99 mg/dL  GLUCOSE,  CAPILLARY     Status: Abnormal   Collection Time    05/30/12  8:00 PM      Result Value Range   Glucose-Capillary 172 (*) 70 - 99 mg/dL  BASIC METABOLIC PANEL     Status: Abnormal   Collection Time    05/30/12  9:11 PM      Result Value Range   Sodium 141  135 - 145 mEq/L   Potassium 3.3 (*) 3.5 - 5.1 mEq/L   Chloride 102  96 - 112 mEq/L   CO2 33 (*) 19 - 32 mEq/L   Glucose, Bld 135 (*) 70 - 99 mg/dL   BUN 8  6 - 23 mg/dL   Creatinine, Ser 8.29  0.50 - 1.10 mg/dL   Calcium 9.3  8.4 - 56.2 mg/dL   GFR calc non Af Amer 71 (*) >90 mL/min   GFR calc Af Amer 82 (*) >90 mL/min  MAGNESIUM     Status: None   Collection Time    05/30/12  9:11 PM      Result Value Range   Magnesium 1.8  1.5 - 2.5 mg/dL  GLUCOSE, CAPILLARY  Status: Abnormal   Collection Time    05/30/12 11:39 PM      Result Value Range   Glucose-Capillary 106 (*) 70 - 99 mg/dL  GLUCOSE, CAPILLARY     Status: Abnormal   Collection Time    05/31/12  3:53 AM      Result Value Range   Glucose-Capillary 135 (*) 70 - 99 mg/dL  HEPARIN LEVEL (UNFRACTIONATED)     Status: Abnormal   Collection Time    05/31/12  5:05 AM      Result Value Range   Heparin Unfractionated 0.29 (*) 0.30 - 0.70 IU/mL  BASIC METABOLIC PANEL     Status: Abnormal   Collection Time    05/31/12  5:05 AM      Result Value Range   Sodium 142  135 - 145 mEq/L   Potassium 3.3 (*) 3.5 - 5.1 mEq/L   Chloride 103  96 - 112 mEq/L   CO2 32  19 - 32 mEq/L   Glucose, Bld 140 (*) 70 - 99 mg/dL   BUN 8  6 - 23 mg/dL   Creatinine, Ser 1.61  0.50 - 1.10 mg/dL   Calcium 9.3  8.4 - 09.6 mg/dL   GFR calc non Af Amer 70 (*) >90 mL/min   GFR calc Af Amer 81 (*) >90 mL/min  PHOSPHORUS     Status: None   Collection Time    05/31/12  5:05 AM      Result Value Range   Phosphorus 2.8  2.3 - 4.6 mg/dL  MAGNESIUM     Status: None   Collection Time    05/31/12  5:05 AM      Result Value Range   Magnesium 1.7  1.5 - 2.5 mg/dL  CBC     Status: Abnormal    Collection Time    05/31/12  5:05 AM      Result Value Range   WBC 6.5  4.0 - 10.5 K/uL   RBC 4.04  3.87 - 5.11 MIL/uL   Hemoglobin 8.0 (*) 12.0 - 15.0 g/dL   HCT 04.5 (*) 40.9 - 81.1 %   MCV 72.8 (*) 78.0 - 100.0 fL   MCH 19.8 (*) 26.0 - 34.0 pg   MCHC 27.2 (*) 30.0 - 36.0 g/dL   RDW 91.4 (*) 78.2 - 95.6 %   Platelets 171  150 - 400 K/uL   Micro Results: Recent Results (from the past 240 hour(s))  URINE CULTURE     Status: None   Collection Time    05/25/12  6:09 PM      Result Value Range Status   Specimen Description URINE, CLEAN CATCH   Final   Special Requests Normal   Final   Culture  Setup Time 05/25/2012 19:33   Final   Colony Count NO GROWTH   Final   Culture NO GROWTH   Final   Report Status 05/27/2012 FINAL   Final  MRSA PCR SCREENING     Status: None   Collection Time    05/25/12  6:15 PM      Result Value Range Status   MRSA by PCR NEGATIVE  NEGATIVE Final   Comment:            The GeneXpert MRSA Assay (FDA     approved for NASAL specimens     only), is one component of a     comprehensive MRSA colonization     surveillance program. It is not  intended to diagnose MRSA     infection nor to guide or     monitor treatment for     MRSA infections.  CULTURE, BLOOD (ROUTINE X 2)     Status: None   Collection Time    05/25/12  7:50 PM      Result Value Range Status   Specimen Description BLOOD LEFT HAND   Final   Special Requests BOTTLES DRAWN AEROBIC ONLY 5CC   Final   Culture  Setup Time 05/26/2012 03:18   Final   Culture     Final   Value:        BLOOD CULTURE RECEIVED NO GROWTH TO DATE CULTURE WILL BE HELD FOR 5 DAYS BEFORE ISSUING A FINAL NEGATIVE REPORT   Report Status PENDING   Incomplete  CULTURE, BLOOD (ROUTINE X 2)     Status: None   Collection Time    05/25/12  7:57 PM      Result Value Range Status   Specimen Description BLOOD RIGHT HAND   Final   Special Requests BOTTLES DRAWN AEROBIC ONLY 3CC   Final   Culture  Setup Time 05/26/2012  03:18   Final   Culture     Final   Value:        BLOOD CULTURE RECEIVED NO GROWTH TO DATE CULTURE WILL BE HELD FOR 5 DAYS BEFORE ISSUING A FINAL NEGATIVE REPORT   Report Status PENDING   Incomplete  BODY FLUID CULTURE     Status: None   Collection Time    05/26/12  5:50 PM      Result Value Range Status   Specimen Description PLEURAL FLUID   Final   Special Requests NONE   Final   Gram Stain     Final   Value: RARE WBC PRESENT, PREDOMINANTLY MONONUCLEAR     NO ORGANISMS SEEN   Culture NO GROWTH 3 DAYS   Final   Report Status 05/30/2012 FINAL   Final  CLOSTRIDIUM DIFFICILE BY PCR     Status: Abnormal   Collection Time    05/30/12  2:44 PM      Result Value Range Status   C difficile by pcr POSITIVE (*) NEGATIVE Final   Comment: CRITICAL RESULT CALLED TO, READ BACK BY AND VERIFIED WITH:     HALLETTM RN 16:40 05/30/12 ([email or web address])   Studies/Results: Dg Chest Port 1 View  05/31/2012  *RADIOLOGY REPORT*  Clinical Data: Evaluate right effusion and chest tube  PORTABLE CHEST - 1 VIEW  Comparison: Portable chest x-ray of 05/30/2012  Findings: The lungs are poorly aerated and there is pulmonary vascular congestion present.  Opacity at the right lung base is most consistent with atelectasis and effusion.  Right chest tube, permanent pacemaker, right IJ central venous line, and Port-A-Cath remain.  Cardiomegaly is stable.  IMPRESSION: Slightly diminished aeration with little change in probable edema.   Original Report Authenticated By: Dwyane Dee, M.D.    Dg Chest Port 1 View  05/30/2012  *RADIOLOGY REPORT*  Clinical Data: Right effusion.  Respiratory distress.  PORTABLE CHEST - 1 VIEW  Comparison: 05/29/2012  Findings: Interval removal of endotracheal tube and NG tube.  Right chest pigtail catheter and left Port-A-Cath remain in place, unchanged.  No pneumothorax.  Bilateral airspace disease, right greater than left with small right effusion.  Stable cardiomegaly. No real change since prior study.   IMPRESSION: Interval extubation.  Otherwise no change.  No pneumothorax.   Original Report Authenticated By: Charlett Nose, M.D.  Medications: I have reviewed the patient's current medications. Scheduled Meds: . ipratropium  0.5 mg Nebulization Q6H   And  . albuterol  2.5 mg Nebulization Q6H  . amLODipine  10 mg Oral Daily  . antiseptic oral rinse  15 mL Mouth Rinse BID  . atorvastatin  10 mg Oral q1800  . budesonide  0.5 mg Nebulization BID  . calcium-vitamin D  1 tablet Oral Daily  . carvedilol  12.5 mg Per Tube Q12H  . cefTRIAXone (ROCEPHIN)  IV  1 g Intravenous Q24H  . chlorhexidine  15 mL Mouth Rinse BID  . famotidine  40 mg Oral BID  . furosemide  40 mg Intravenous Once  . insulin aspart  0-20 Units Subcutaneous Q4H  . metroNIDAZOLE  500 mg Oral Q8H  . multivitamin  5 mL Per Tube Daily  . olopatadine  1 drop Both Eyes BID  . potassium chloride  40 mEq Oral BID   Continuous Infusions: . dextrose 5 % and 0.45% NaCl 20 mL/hr (05/30/12 2354)  . heparin     PRN Meds:.sodium chloride, acetaminophen (TYLENOL) oral liquid 160 mg/5 mL, albuterol, morphine injection, ondansetron (ZOFRAN) IV  Assessment/Plan: Mrs. Fambrough is a 67yo F with PMH pertinent for invasive ductal carcinoma of R breast, CHF, ?COPD, and OSA, who was transferred to the service from the ICU this AM and is primarily being treated for acute respiratory failure, a R pleural effusion, RLE DVT, and C. diff infection.  1. BL (R>>L) pleural effusion: The R-sided chest tube drained 70cc in last 24hrs.  CXR this AM unchanged from previous.  Pleural fluid cx NGTD.  Other effusion diagnostic labs are not found in system.  Talked with VIR and pathology this AM, who said that performing dx labs on remaining pleural fluid (while not ideal) would have some validity.  We will follow that recommendation, as knowing the nature of the effusion (malignant v. Benign) will help indicate management.  Looking back through the records, small  BL pleural effusions have been visualized on CXR since 12/2011, so this seems to be more of an indolent and progressive process over the past few months. - Send pleural fluid for cytology, LDH, glucose, protein and cell count - D/c chest tube per PCCM recommendations  2. ?PNA: Started on vanc and zosyn in the ICU, and subsequently switched to Cetriaxone.  On day 6 of therapy.  An infectious etiology is not immediately apparent here, as the underlying etiology for her respiratory distress was more likely to be the increasing R pleural effusion, as evidenced from her great sx benefit s/p  - Cont ceftriaxone to finish 7 day course  3. ?COPD: Upon review of PFTs done in 2012, obstructive dz is questionable due to FEV1/FVC of 98%, TLC at 68% and RV at 70%.  COPD was likely diagnosed based on FEF 25-75% of 64%.  Will continue breathing tx, as likely receiving some sx benefit. - Continue duonebs - Continue budesonide  3. dCHF: Successful diuresis in the past few days with normal creatinine.  Trace peripheral edema and pulmonary edema on CXR on exam, so would likely benefit from another day of diuresis. - 40 IV Lasix x 1 today  4. RLE DVT: Stable. - D/c Heparin ggt - Lovenox indicated in cancer pts for long-term anticoag, will start today  5. C. diff infection: Stable. No episodes of diarrhea this AM. - Cont Flagyl  6. R invasive ductal carcinoma: Due for her next fulvestrant treatment, but will have to postpone  until this acute illness is optimized.  Talked with Dr. [name] today who agreed with plan.  7. AKI: Resolved, Cr at 0.86 this AM. - Continue to monitor and avoid nephrotoxic drugs  8. Hypokalemia: Pt has been hyperkalemic in past. - Replete K  9. Anemia: Stable with Hgb 8.0.  11. DM: Well-controlled. - Continue SSI  12. HTN: Running 147-155/58-65 overnight. - Continue amlodipine and carvedilol  13. CAD: Stable.  - Continue atorvastatin.  14. FEN/GI: Stable. - Regular diet -  Continue ranitidine  15. Dispo: Floor status.     LOS: 7 days   This is a Psychologist, occupational Note.  The care of the patient was discussed with Dr. Sherrine Maples and the assessment and plan formulated with their assistance.  Please see their attached note for official documentation of the daily encounter.  Melissa Villa Amy 05/31/2012, 9:42 AM

## 2012-05-31 NOTE — Progress Notes (Signed)
Subjective: Pt without acute changes  Objective: Vital signs in last 24 hours: Temp:  [97.6 F (36.4 C)-99.6 F (37.6 C)] 99.6 F (37.6 C) (02/12 0658) Pulse Rate:  [74-90] 81 (02/12 0658) Resp:  [18-29] 22 (02/12 0658) BP: (94-155)/(49-72) 155/63 mmHg (02/12 0658) SpO2:  [93 %-100 %] 94 % (02/12 0849) Last BM Date: 05/30/12  Intake/Output from previous day: 02/11 0701 - 02/12 0700 In: 1111.2 [I.V.:961.2; IV Piggyback:150] Out: 2396 [Urine:2325; Stool:1; Chest Tube:70] Intake/Output this shift:    Right chest tube intact, output 70 cc's today , cx's neg; no air leak  Lab Results:   Recent Labs  05/30/12 0443 05/31/12 0505  WBC 6.7 6.5  HGB 7.7* 8.0*  HCT 28.0* 29.4*  PLT 179 171   BMET  Recent Labs  05/30/12 2111 05/31/12 0505  NA 141 142  K 3.3* 3.3*  CL 102 103  CO2 33* 32  GLUCOSE 135* 140*  BUN 8 8  CREATININE 0.85 0.86  CALCIUM 9.3 9.3   PT/INR No results found for this basename: LABPROT, INR,  in the last 72 hours ABG No results found for this basename: PHART, PCO2, PO2, HCO3,  in the last 72 hours  Studies/Results: Dg Chest Port 1 View  05/31/2012  *RADIOLOGY REPORT*  Clinical Data: Evaluate right effusion and chest tube  PORTABLE CHEST - 1 VIEW  Comparison: Portable chest x-ray of 05/30/2012  Findings: The lungs are poorly aerated and there is pulmonary vascular congestion present.  Opacity at the right lung base is most consistent with atelectasis and effusion.  Right chest tube, permanent pacemaker, right IJ central venous line, and Port-A-Cath remain.  Cardiomegaly is stable.  IMPRESSION: Slightly diminished aeration with little change in probable edema.   Original Report Authenticated By: Dwyane Dee, M.D.    Dg Chest Port 1 View  05/30/2012  *RADIOLOGY REPORT*  Clinical Data: Right effusion.  Respiratory distress.  PORTABLE CHEST - 1 VIEW  Comparison: 05/29/2012  Findings: Interval removal of endotracheal tube and NG tube.  Right chest  pigtail catheter and left Port-A-Cath remain in place, unchanged.  No pneumothorax.  Bilateral airspace disease, right greater than left with small right effusion.  Stable cardiomegaly. No real change since prior study.  IMPRESSION: Interval extubation.  Otherwise no change.  No pneumothorax.   Original Report Authenticated By: Charlett Nose, M.D.     Anti-infectives: Anti-infectives   Start     Dose/Rate Route Frequency Ordered Stop   05/30/12 2200  metroNIDAZOLE (FLAGYL) tablet 500 mg     500 mg Oral 3 times per day 05/30/12 1813 06/13/12 2159   05/29/12 1400  cefTRIAXone (ROCEPHIN) 1 g in dextrose 5 % 50 mL IVPB     1 g 100 mL/hr over 30 Minutes Intravenous Every 24 hours 05/29/12 1215 06/02/12 1359   05/28/12 1200  vancomycin (VANCOCIN) 1,250 mg in sodium chloride 0.9 % 250 mL IVPB  Status:  Discontinued     1,250 mg 166.7 mL/hr over 90 Minutes Intravenous Every 12 hours 05/28/12 1051 05/29/12 1214   05/27/12 2000  vancomycin (VANCOCIN) 1,750 mg in sodium chloride 0.9 % 500 mL IVPB  Status:  Discontinued     1,750 mg 250 mL/hr over 120 Minutes Intravenous Every 48 hours 05/25/12 1858 05/27/12 1111   05/27/12 1200  vancomycin (VANCOCIN) 1,750 mg in sodium chloride 0.9 % 500 mL IVPB  Status:  Discontinued     1,750 mg 250 mL/hr over 120 Minutes Intravenous Every 48 hours 05/27/12 1110 05/28/12  1050   05/25/12 2200  piperacillin-tazobactam (ZOSYN) IVPB 3.375 g  Status:  Discontinued     3.375 g 12.5 mL/hr over 240 Minutes Intravenous 3 times per day 05/25/12 1858 05/29/12 1214   05/25/12 2000  vancomycin (VANCOCIN) 2,500 mg in sodium chloride 0.9 % 500 mL IVPB     2,500 mg 250 mL/hr over 120 Minutes Intravenous  Once 05/25/12 1858 05/25/12 2200      Assessment/Plan: s/p right chest drain secondary to loculated effusion 2/7; will order additional labs on pleural fluid as requested by primary team; check f/u CT chest once drain output declines to assess adequacy of drainage/need for tube  removal.   LOS: 7 days    Adrien Dietzman,D [facility] 05/31/2012

## 2012-05-31 NOTE — Progress Notes (Signed)
Subjective: Pt states that her breathing is much better since admission, but states that her breathing is unchanged from yesterday. Per Dr. Arlice Colt, her Oncologist, she is missing her Fulvestrant, but he states she can resume it after discharge.  Objective: Vital signs in last 24 hours: Filed Vitals:   05/30/12 2042 05/30/12 2105 05/30/12 2144 05/31/12 0658  BP: 147/58  150/65 155/63  Pulse: 90 87 88 81  Temp: 97.6 F (36.4 C)  98.8 F (37.1 C) 99.6 F (37.6 C)  TempSrc: Oral  Oral Oral  Resp: 22 18 22 22   Height:      Weight:      SpO2: 96% 97% 95% 93%   Weight change:   Intake/Output Summary (Last 24 hours) at 05/31/12 0837 Last data filed at 05/31/12 0659  Gross per 24 hour  Intake 1111.23 ml  Output   2396 ml  Net -1284.77 ml   Vitals reviewed. General: Sitting up in a chair, NAD HEENT: PERRL, EOMI, no scleral icterus Cardiac: RRR, no rubs, murmurs or gallops Pulm: Diffusely coarse breath sounds, productive cough, diminished at the bases Abd: Obese, soft, nontender Ext: Warm and well perfused, trace edema Neuro: Alert and oriented X3, non-focal  Lab Results: Basic Metabolic Panel:  Recent Labs Lab 05/30/12 2111 05/31/12 0505  NA 141 142  K 3.3* 3.3*  CL 102 103  CO2 33* 32  GLUCOSE 135* 140*  BUN 8 8  CREATININE 0.85 0.86  CALCIUM 9.3 9.3  MG 1.8 1.7  PHOS  --  2.8   Liver Function Tests:  Recent Labs Lab 05/25/12 0535 05/30/12 0443  AST 14  15 23   ALT 7  8 13   ALKPHOS 54  51 44  BILITOT 0.1*  0.2* 0.2*  PROT 7.2  7.2 6.9  ALBUMIN 2.9*  2.8* 2.5*   CBC:  Recent Labs Lab 05/24/12 1211  05/30/12 0443 05/31/12 0505  WBC 9.5  < > 6.7 6.5  NEUTROABS 7.1  --   --   --   HGB 7.5*  < > 7.7* 8.0*  HCT 27.6*  < > 28.0* 29.4*  MCV 70.8*  < > 72.9* 72.8*  PLT 211  < > 179 171  < > = values in this interval not displayed. Cardiac Enzymes:  Recent Labs Lab 05/27/12 2121 05/28/12 0301 05/28/12 0857  TROPONINI <0.30 <0.30 <0.30    BNP:  Recent Labs Lab 05/24/12 1211  PROBNP 442.0*   CBG:  Recent Labs Lab 05/30/12 0849 05/30/12 1213 05/30/12 1603 05/30/12 2000 05/30/12 2339 05/31/12 0353  GLUCAP 123* 114* 138* 172* 106* 135*   Hemoglobin A1C:  Recent Labs Lab 05/24/12 1103  HGBA1C 7.0   Fasting Lipid Panel:  Recent Labs Lab 05/24/12 1211  CHOL 90  HDL 21*  LDLCALC 48  TRIG 409  CHOLHDL 4.3   Anemia Panel:  Recent Labs Lab 05/28/12 0800  FERRITIN 19  TIBC 302  IRON 16*   Urine Drug Screen: Drugs of Abuse     Component Value Date/Time   LABOPIA POSITIVE* 10/21/2006 1513   COCAINSCRNUR NONE DETECTED 10/21/2006 1513   LABBENZ NONE DETECTED 10/21/2006 1513   AMPHETMU NONE DETECTED 10/21/2006 1513   THCU NONE DETECTED 10/21/2006 1513   LABBARB  Value: NONE DETECTED        DRUG SCREEN FOR MEDICAL PURPOSES ONLY.  IF CONFIRMATION IS NEEDED FOR ANY PURPOSE, NOTIFY LAB WITHIN 5 DAYS. 10/21/2006 1513    Urinalysis:  Recent Labs Lab 05/25/12 1810  COLORURINE YELLOW  LABSPEC 1.018  PHURINE 5.0  GLUCOSEU NEGATIVE  HGBUR NEGATIVE  BILIRUBINUR NEGATIVE  KETONESUR NEGATIVE  PROTEINUR 30*  UROBILINOGEN 0.2  NITRITE NEGATIVE  LEUKOCYTESUR NEGATIVE    Micro Results: Recent Results (from the past 240 hour(s))  URINE CULTURE     Status: None   Collection Time    05/25/12  6:09 PM      Result Value Range Status   Specimen Description URINE, CLEAN CATCH   Final   Special Requests Normal   Final   Culture  Setup Time 05/25/2012 19:33   Final   Colony Count NO GROWTH   Final   Culture NO GROWTH   Final   Report Status 05/27/2012 FINAL   Final  MRSA PCR SCREENING     Status: None   Collection Time    05/25/12  6:15 PM      Result Value Range Status   MRSA by PCR NEGATIVE  NEGATIVE Final   Comment:            The GeneXpert MRSA Assay (FDA     approved for NASAL specimens     only), is one component of a     comprehensive MRSA colonization     surveillance program. It is not      intended to diagnose MRSA     infection nor to guide or     monitor treatment for     MRSA infections.  CULTURE, BLOOD (ROUTINE X 2)     Status: None   Collection Time    05/25/12  7:50 PM      Result Value Range Status   Specimen Description BLOOD LEFT HAND   Final   Special Requests BOTTLES DRAWN AEROBIC ONLY 5CC   Final   Culture  Setup Time 05/26/2012 03:18   Final   Culture     Final   Value:        BLOOD CULTURE RECEIVED NO GROWTH TO DATE CULTURE WILL BE HELD FOR 5 DAYS BEFORE ISSUING A FINAL NEGATIVE REPORT   Report Status PENDING   Incomplete  CULTURE, BLOOD (ROUTINE X 2)     Status: None   Collection Time    05/25/12  7:57 PM      Result Value Range Status   Specimen Description BLOOD RIGHT HAND   Final   Special Requests BOTTLES DRAWN AEROBIC ONLY 3CC   Final   Culture  Setup Time 05/26/2012 03:18   Final   Culture     Final   Value:        BLOOD CULTURE RECEIVED NO GROWTH TO DATE CULTURE WILL BE HELD FOR 5 DAYS BEFORE ISSUING A FINAL NEGATIVE REPORT   Report Status PENDING   Incomplete  BODY FLUID CULTURE     Status: None   Collection Time    05/26/12  5:50 PM      Result Value Range Status   Specimen Description PLEURAL FLUID   Final   Special Requests NONE   Final   Gram Stain     Final   Value: RARE WBC PRESENT, PREDOMINANTLY MONONUCLEAR     NO ORGANISMS SEEN   Culture NO GROWTH 3 DAYS   Final   Report Status 05/30/2012 FINAL   Final  CLOSTRIDIUM DIFFICILE BY PCR     Status: Abnormal   Collection Time    05/30/12  2:44 PM      Result Value Range Status   C difficile by pcr POSITIVE (*) NEGATIVE  Final   Comment: CRITICAL RESULT CALLED TO, READ BACK BY AND VERIFIED WITH:     HALLETTM RN 16:40 05/30/12 ([email or web address])   Studies/Results: Dg Chest Port 1 View  05/31/2012  *RADIOLOGY REPORT*  Clinical Data: Evaluate right effusion and chest tube  PORTABLE CHEST - 1 VIEW  Comparison: Portable chest x-ray of 05/30/2012  Findings: The lungs are poorly aerated and there is  pulmonary vascular congestion present.  Opacity at the right lung base is most consistent with atelectasis and effusion.  Right chest tube, permanent pacemaker, right IJ central venous line, and Port-A-Cath remain.  Cardiomegaly is stable.  IMPRESSION: Slightly diminished aeration with little change in probable edema.   Original Report Authenticated By: Dwyane Dee, M.D.    Dg Chest Port 1 View  05/30/2012  *RADIOLOGY REPORT*  Clinical Data: Right effusion.  Respiratory distress.  PORTABLE CHEST - 1 VIEW  Comparison: 05/29/2012  Findings: Interval removal of endotracheal tube and NG tube.  Right chest pigtail catheter and left Port-A-Cath remain in place, unchanged.  No pneumothorax.  Bilateral airspace disease, right greater than left with small right effusion.  Stable cardiomegaly. No real change since prior study.  IMPRESSION: Interval extubation.  Otherwise no change.  No pneumothorax.   Original Report Authenticated By: Charlett Nose, M.D.    Medications: I have reviewed the patient's current medications. Scheduled Meds: . ipratropium  0.5 mg Nebulization Q6H   And  . albuterol  2.5 mg Nebulization Q6H  . amLODipine  10 mg Oral Daily  . antiseptic oral rinse  15 mL Mouth Rinse BID  . atorvastatin  10 mg Oral q1800  . budesonide  0.5 mg Nebulization BID  . calcium-vitamin D  1 tablet Oral Daily  . carvedilol  12.5 mg Per Tube Q12H  . cefTRIAXone (ROCEPHIN)  IV  1 g Intravenous Q24H  . chlorhexidine  15 mL Mouth Rinse BID  . famotidine  40 mg Oral BID  . insulin aspart  0-20 Units Subcutaneous Q4H  . metroNIDAZOLE  500 mg Oral Q8H  . multivitamin  5 mL Per Tube Daily  . olopatadine  1 drop Both Eyes BID  . potassium chloride  40 mEq Oral BID   Continuous Infusions: . dextrose 5 % and 0.45% NaCl 20 mL/hr (05/30/12 2354)  . heparin     PRN Meds:.sodium chloride, acetaminophen (TYLENOL) oral liquid 160 mg/5 mL, albuterol, morphine injection, ondansetron (ZOFRAN) IV Assessment/Plan: Mrs.  Wix is a 67yo F with PMH pertinent for invasive ductal carcinoma of R breast, CHF, ?COPD, and OSA, who was transferred to the service from the ICU this AM and is primarily being treated for acute respiratory failure, a R pleural effusion, RLE DVT, and C. diff infection.   1. Bilateral pleural effusion: Pleural effusions first noted 12/2011 and were very small. Slow progressive increase in effusions since that time, R>L. S/p right chest tube, placed in the ICU on 2/7. Unfortunately the fluid was not sent off for labs. Cytology recommends collecting cytology from 'fresh' chest tube sample. Spoke with IR and will send off labs on pleural fluid from pleura vac contraption, as I don't think there is enough fluid draining from the chest tube. Over the past 24hrs. 70cc from the CT. The big question is what is the cause of this effusion. Possible it is a malignant effusion related to her breast cancer; hopefully cytology and the other labs will give [place] a better picture.  - F/u pleural fluid labs  2. ?HCAP: She was  started on Vanc and Zosyn on admission to the ICU, which was changed to Ceftriaxone. She is on day 6 of 7. While we are unsure if she actually has HCAP, will complete therapy HCAP since it was begun. - Cont ceftriaxone to finish 7 day course   3. Diastolic CHF: Volume overload on admission. BLE edema present on admission and pulm vascular congestion on CXR. ECHO performed on 05/25/12: EF 60 to 65%, grade 1 diastolic dysfx, PAS 104 mmHg. Diureses with 40mg  IV Lasix on 2/11. Total of about -7L. Will continue to diurese.  - Another 40 IV Lasix x 1 today   4. RLE DVT: Seen on BLE duplex on 2/7. She was started on a heparin drip, which was transitioned to Lovenox, which is indicated in cancer pts for long-term anticoagulation. - Lovenox per Pharmacy  5. C. diff infection: Pt been on abx for presumed HCAP. Still on Rocephin. Started on Flagyl for C.diff treatment 2/11.  - Cont Flagyl   6. R invasive  ductal carcinoma: Being treated by Dr. [name]. The Herceptin was discontinued due to concerns for possible CHF. She is currently on Fulvestrant, but is actually missing a dose while in the hospital. Spoke to Dr. [name] who would like the pt to schedule a f/u appt once she is out of the hospital. He states missing the dose of the Fulvestrant for a week will not negatively affect the patient.  7. AKI: Resolved Cr 0.86. Will monitor, especially since we are diuresing with Lasix.   8. Hypokalemia: Hyperkalemia on admission. Now potassium low. Replacing potassium.   9. Anemia: Stable. Hbg 8.0   10. Acute encephalopathy: Resolved. Thought to be 2/2 hypoxia and hypercapnea on HD #2.  11. DM: On Metformin and Januvia at home. These were held on admission. She is on resistant SSI. CBGs have been slightly elevated this admission.   12. HTN: BP slightly elevated but stable.  - Continue amlodipine and carvedilol  13. CAD: On Crestor at home.  Started Atorvastin on admission.  14. Dispo: D/c pending further clinical improvement.   The patient does have a current PCP [name], MD), therefore will be requiring OPC follow-up after discharge.   The patient does not have transportation limitations that hinder transportation to clinic appointments.  .Services Needed at time of discharge: Y = Yes, Blank = No PT:   OT:   RN:   Equipment:   Other:     LOS: 7 days   Genelle Gather 05/31/2012, 8:37 AM

## 2012-05-31 NOTE — Progress Notes (Signed)
PULMONARY  / CRITICAL CARE MEDICINE  Name: Melissa Villa MRN: [id number] DOB: 1945/09/19    ADMISSION DATE:  05/24/2012 CONSULTATION DATE:  05/25/2012  REFERRING MD :  Mariana Arn  CHIEF COMPLAINT:  Short or breath  BRIEF PATIENT DESCRIPTION:  67 y/o female former smoker admitted with dry cough, dyspnea, and weakness.  Developed progressive hypoxic/hypercapnic respiratory failure, fever (Tm101.72F) and acute encephalopathy.  Transferred to ICU 2/06 and PCCM assumed medical care.   She was in hospital in December 2013 for PNA, Rt Empyema, AECOPD, and CHF exacerbation.  Significant PMHx of COPD on home oxygen, OSA/OHS, AV block s/p PM, Diastolic CHF, DM type II, Neuropathy, s/p RLL ectomy for carcinoid, Breast cancer  SIGNIFICANT EVENTS: 2/05 -  Admit 2/06 - Transfer to ICU, VDRF 2/08 - Tolerating PS wean 5/5 40% 2/10 - extubated  STUDIES:  2/06 Echo >> EF 60 to 65%, grade 1 diastolic dysfx, PAS 104 mmHg 2/06 CT chest >> mod Rt effusion, small Lt effusion  LINES / TUBES: ETT 2/06 >>2/10 Rt IJ CVL 2/06 >>  L Chest Port >>> R Chest Tube 2/7 >>  CULTURES: Blood 2/06 >>  No growth 5 days Sputum 2/06>> Canceled UC 2/6>>>neg Rt pleural fluid 2/7>> No growth 3 days, rare WBC  ANTIBIOTICS: Vancomycin 2/06 >>2/10 Zosyn 2/06 >> 2/10 ceftriaxone 2/10>>>2/13  SUBJECTIVE: complains of dry, non-productive cough, feels achy    VITAL SIGNS: Temp:  [97.6 F (36.4 C)-99.6 F (37.6 C)] 99.6 F (37.6 C) (02/12 0658) Pulse Rate:  [78-90] 81 (02/12 0658) Resp:  [18-29] 22 (02/12 0658) BP: (94-155)/(52-72) 155/63 mmHg (02/12 0658) SpO2:  [93 %-100 %] 94 % (02/12 0849)  INTAKE / OUTPUT: Intake/Output     02/11 0701 - 02/12 0700 02/12 0701 - 02/13 0700   I.V. (mL/kg) 961.2 (7.7)    NG/GT     IV Piggyback 150    Total Intake(mL/kg) 1111.2 (8.9)    Urine (mL/kg/hr) 2325 (0.8)    Stool 1 (0)    Chest Tube 70 (0)    Total Output 2396     Net -1284.8          Stool Occurrence 3 x       PHYSICAL EXAMINATION: General:  No distress at rest in chair Neuro:  Follows commands, awake, alert HEENT:  obese Cardiovascular:  s1s2 regular, no murmur Lungs:  B/l rhonchi,R CT x1 Abdomen:  Obese, soft, decreased bowel sounds Musculoskeletal:  No edema Skin: post-XRT changes Rt chest  LABS:  Recent Labs Lab 05/28/12 0301 05/29/12 0455 05/30/12 0443 05/30/12 2111 05/31/12 0505  NA 141 144 147* 141 142  K 4.3 3.7 3.4* 3.3* 3.3*  CL 105 106 104 102 103  CO2 29 32 31 33* 32  GLUCOSE 98 131* 108* 135* 140*  BUN 21 15 10 8 8   CREATININE 1.48* 1.19* 0.90 0.85 0.86  CALCIUM 8.5 8.9 9.2 9.3 9.3  MG  --   --   --  1.8 1.7  PHOS  --   --   --   --  2.8    Recent Labs Lab 05/29/12 0455 05/30/12 0443 05/31/12 0505  HGB 7.5* 7.7* 8.0*  HCT 27.1* 28.0* 29.4*  WBC 7.4 6.7 6.5  PLT 151 179 171   Imaging:  Dg Chest Port 1 View  05/31/2012  *RADIOLOGY REPORT*  Clinical Data: Evaluate right effusion and chest tube  PORTABLE CHEST - 1 VIEW  Comparison: Portable chest x-ray of 05/30/2012  Findings: The lungs are  poorly aerated and there is pulmonary vascular congestion present.  Opacity at the right lung base is most consistent with atelectasis and effusion.  Right chest tube, permanent pacemaker, right IJ central venous line, and Port-A-Cath remain.  Cardiomegaly is stable.  IMPRESSION: Slightly diminished aeration with little change in probable edema.   Original Report Authenticated By: Dwyane Dee, M.D.    Dg Chest Port 1 View  05/30/2012  *RADIOLOGY REPORT*  Clinical Data: Right effusion.  Respiratory distress.  PORTABLE CHEST - 1 VIEW  Comparison: 05/29/2012  Findings: Interval removal of endotracheal tube and NG tube.  Right chest pigtail catheter and left Port-A-Cath remain in place, unchanged.  No pneumothorax.  Bilateral airspace disease, right greater than left with small right effusion.  Stable cardiomegaly. No real change since prior study.  IMPRESSION: Interval  extubation.  Otherwise no change.  No pneumothorax.   Original Report Authenticated By: Charlett Nose, M.D.      ASSESSMENT / PLAN:  PULMONARY Acute on chronic respiratory failure 2nd to PNA and recurrent Rt pleural effusion. Secondary pulmonary hypertension - PA Pressure 104 on ECHO 2/6 Hx of COPD. Hx of OSA/OHS.  P:   IS When output less 150 / 24 hrs, consider dc Chest tube, keep to water seal for now CXR tomorrow AM Continue lasix Fluid with neg micro Send cytology, follow studies  CARDIOVASCULAR  Hx of CAD, HTN, Diastolic CHF. Hypotension after intubation 2/06 >> Resolved 2/07.  P:  continue lasix Unable to dc line neck yet on heparin, see heme Lipitor, coreg Continue amlodipine  RENAL    Acute on chronic renal failure >> likely from diuresis.  Improved. Hyperkalemia >> resolved.  P:   K supp kvo lasix  GASTROINTESTINAL    Morbid obesity. Nutrition. C-Diff positive stool  P:   Diet advance carb mod ppi Cdiff positive, flagyl  HEMATOLOGIC    Anemia of chronic disease and critical illness. PRBC transfusion 2/07.  No current evidence for bleeding. Hx of Breast cancer. thrombocytopenia  P:  Plat  Rising Heparin drip, coumadin once Chest tube out Lasix to hemoconcetrate  INFECTIOUS A:   HCAP - culture neg,  Effusion - ?? Pleural studies.  Do not see 2/12 other than negative culture. Orders in for Protein, LDH, glucose C-Diff   P:   -ceftriaxone >stop date 13th -abx as above, per primary svc  ENDOCRINE  DM type II with hyperglycemia.  P:   SSI, currently well controlled  NEUROLOGIC  Acute encephalopathy 2nd to hypercapnia. Improved 2/07.  Off gtts 2/8.   P:   Pt active  SUMMARY:  67 y/o F with pleural effusion on vent s/p CT per IR.  RLE DVT on heparin gtt.  S/p brief vent stay. Now improved and tfr to floor under IMTS care, PCCM will stay on as pulm conuslt for lung / effusion. Heparin , coumadin once CT out. C-diff positive stool.     PCCM will continue to follow for pulmonary needs.    Canary Brim, NP-C [facility] Pgr: [phone] or [phone]  I have seen and examined this pt and agree with the above note.  Dorcas Carrow Beeper  [phone]  Cell  [phone]  If no response or cell goes to voicemail, call beeper [phone]

## 2012-05-31 NOTE — Progress Notes (Signed)
Visit to patient while in hospital. Will call after she is discharged to assist with transition of care. 

## 2012-06-01 ENCOUNTER — Other Ambulatory Visit: Payer: Self-pay | Admitting: Lab

## 2012-06-01 ENCOUNTER — Ambulatory Visit: Payer: Self-pay

## 2012-06-01 ENCOUNTER — Inpatient Hospital Stay (HOSPITAL_COMMUNITY): Payer: Medicare Other

## 2012-06-01 LAB — CBC
MCH: 20.3 pg — ABNORMAL LOW (ref 26.0–34.0)
MCHC: 27.7 g/dL — ABNORMAL LOW (ref 30.0–36.0)
MCV: 73.2 fL — ABNORMAL LOW (ref 78.0–100.0)
Platelets: 176 10*3/uL (ref 150–400)
RDW: 23.9 % — ABNORMAL HIGH (ref 11.5–15.5)
WBC: 6.2 10*3/uL (ref 4.0–10.5)

## 2012-06-01 LAB — BASIC METABOLIC PANEL
BUN: 10 mg/dL (ref 6–23)
CO2: 31 mEq/L (ref 19–32)
Calcium: 9.6 mg/dL (ref 8.4–10.5)
Creatinine, Ser: 0.92 mg/dL (ref 0.50–1.10)
Glucose, Bld: 147 mg/dL — ABNORMAL HIGH (ref 70–99)

## 2012-06-01 LAB — GLUCOSE, CAPILLARY
Glucose-Capillary: 135 mg/dL — ABNORMAL HIGH (ref 70–99)
Glucose-Capillary: 139 mg/dL — ABNORMAL HIGH (ref 70–99)
Glucose-Capillary: 167 mg/dL — ABNORMAL HIGH (ref 70–99)
Glucose-Capillary: 206 mg/dL — ABNORMAL HIGH (ref 70–99)
Glucose-Capillary: 130 mg/dL — ABNORMAL HIGH (ref 70–99)

## 2012-06-01 LAB — HEPATIC FUNCTION PANEL
Albumin: 2.8 g/dL — ABNORMAL LOW (ref 3.5–5.2)
Alkaline Phosphatase: 50 U/L (ref 39–117)
Total Bilirubin: 0.2 mg/dL — ABNORMAL LOW (ref 0.3–1.2)
Total Protein: 7.5 g/dL (ref 6.0–8.3)

## 2012-06-01 LAB — PROTIME-INR
INR: 1.04 (ref 0.00–1.49)
Prothrombin Time: 13.5 seconds (ref 11.6–15.2)

## 2012-06-01 MED ORDER — FUROSEMIDE 80 MG PO TABS
80.0000 mg | ORAL_TABLET | Freq: Once | ORAL | Status: DC
  Filled 2012-06-01: qty 1

## 2012-06-01 MED ORDER — FUROSEMIDE 10 MG/ML IJ SOLN
40.0000 mg | Freq: Once | INTRAMUSCULAR | Status: DC
  Filled 2012-06-01: qty 4

## 2012-06-01 MED ORDER — SODIUM CHLORIDE 0.9 % IJ SOLN
10.0000 mL | INTRAMUSCULAR | Status: DC | PRN
  Administered 2012-06-01: 30 mL
  Administered 2012-06-02: 10 mL
  Administered 2012-06-04: 30 mL
  Administered 2012-06-05: 10 mL

## 2012-06-01 MED ORDER — INSULIN ASPART 100 UNIT/ML ~~LOC~~ SOLN
0.0000 [IU] | Freq: Three times a day (TID) | SUBCUTANEOUS | Status: DC
Start: 2012-06-01 — End: 2012-06-06
  Administered 2012-06-01 – 2012-06-02 (×2): 7 [IU] via SUBCUTANEOUS
  Administered 2012-06-02 (×2): 4 [IU] via SUBCUTANEOUS
  Administered 2012-06-03: 7 [IU] via SUBCUTANEOUS
  Administered 2012-06-03: 4 [IU] via SUBCUTANEOUS
  Administered 2012-06-03: 7 [IU] via SUBCUTANEOUS
  Administered 2012-06-04 – 2012-06-05 (×4): 4 [IU] via SUBCUTANEOUS
  Administered 2012-06-05: 7 [IU] via SUBCUTANEOUS
  Administered 2012-06-05 – 2012-06-06 (×2): 4 [IU] via SUBCUTANEOUS
  Administered 2012-06-06: 15 [IU] via SUBCUTANEOUS

## 2012-06-01 MED ORDER — SODIUM CHLORIDE 0.9 % IJ SOLN: 10.0000 mL | Freq: Two times a day (BID) | INTRAMUSCULAR | Status: DC

## 2012-06-01 NOTE — Progress Notes (Signed)
Subjective: This morning she states that she is doing ok. She states that she is unable to tell if her breathing is better today because she just got up.   Objective: Vital signs in last 24 hours: Filed Vitals:   06/01/12 0928 06/01/12 1413 06/01/12 1431 06/01/12 1552  BP:  144/61    Pulse:  76    Temp:  97.5 F (36.4 C)    TempSrc:  Oral    Resp:  19    Height:      Weight:      SpO2: 92% 96% 95% 96%   Weight change:   Intake/Output Summary (Last 24 hours) at 06/01/12 1658 Last data filed at 06/01/12 1548  Gross per 24 hour  Intake    385 ml  Output   1170 ml  Net   -785 ml   Vitals reviewed. General: Sitting up in a chair about to eat breakfast, NAD HEENT: PERRL, EOMI, no scleral icterus Cardiac: RRR, no rubs, murmurs or gallops Pulm: Diffusely coarse breath sounds, productive cough, diminished at the bases Abd: Obese, soft, nontender Ext: Warm and well perfused, 1+pitting edema in BLE Neuro: Alert and oriented X3, non-focal  Lab Results: Basic Metabolic Panel:  Recent Labs Lab 05/30/12 2111 05/31/12 0505 06/01/12 0515  NA 141 142 142  K 3.3* 3.3* 3.5  CL 102 103 102  CO2 33* 32 31  GLUCOSE 135* 140* 147*  BUN 8 8 10   CREATININE 0.85 0.86 0.92  CALCIUM 9.3 9.3 9.6  MG 1.8 1.7  --   PHOS  --  2.8  --    Liver Function Tests:  Recent Labs Lab 05/30/12 0443 06/01/12 0630  AST 23 26  ALT 13 15  ALKPHOS 44 50  BILITOT 0.2* 0.2*  PROT 6.9 7.5  ALBUMIN 2.5* 2.8*   CBC:  Recent Labs Lab 05/31/12 0505 06/01/12 0515  WBC 6.5 6.2  HGB 8.0* 8.4*  HCT 29.4* 30.3*  MCV 72.8* 73.2*  PLT 171 176   Cardiac Enzymes:  Recent Labs Lab 05/27/12 2121 05/28/12 0301 05/28/12 0857  TROPONINI <0.30 <0.30 <0.30   ProBNP:  03/22/12: 193.6  04/02/12: 441  05/24/12:  442  CBG:  Recent Labs Lab 05/31/12 1802 05/31/12 2018 06/01/12 0017 06/01/12 0408 06/01/12 0853 06/01/12 1214  GLUCAP 216* 176* 112* 135* 139* 167*   Anemia  Panel:  Recent Labs Lab 05/28/12 0800  FERRITIN 19  TIBC 302  IRON 16*   Urine Drug Screen: Drugs of Abuse     Component Value Date/Time   LABOPIA POSITIVE* 10/21/2006 1513   COCAINSCRNUR NONE DETECTED 10/21/2006 1513   LABBENZ NONE DETECTED 10/21/2006 1513   AMPHETMU NONE DETECTED 10/21/2006 1513   THCU NONE DETECTED 10/21/2006 1513   LABBARB  Value: NONE DETECTED        DRUG SCREEN FOR MEDICAL PURPOSES ONLY.  IF CONFIRMATION IS NEEDED FOR ANY PURPOSE, NOTIFY LAB WITHIN 5 DAYS. 10/21/2006 1513    Urinalysis:  Recent Labs Lab 05/25/12 1810  COLORURINE YELLOW  LABSPEC 1.018  PHURINE 5.0  GLUCOSEU NEGATIVE  HGBUR NEGATIVE  BILIRUBINUR NEGATIVE  KETONESUR NEGATIVE  PROTEINUR 30*  UROBILINOGEN 0.2  NITRITE NEGATIVE  LEUKOCYTESUR NEGATIVE    Micro Results: Recent Results (from the past 240 hour(s))  URINE CULTURE     Status: None   Collection Time    05/25/12  6:09 PM      Result Value Range Status   Specimen Description URINE, CLEAN CATCH   Final  Special Requests Normal   Final   Culture  Setup Time 05/25/2012 19:33   Final   Colony Count NO GROWTH   Final   Culture NO GROWTH   Final   Report Status 05/27/2012 FINAL   Final  MRSA PCR SCREENING     Status: None   Collection Time    05/25/12  6:15 PM      Result Value Range Status   MRSA by PCR NEGATIVE  NEGATIVE Final   Comment:            The GeneXpert MRSA Assay (FDA     approved for NASAL specimens     only), is one component of a     comprehensive MRSA colonization     surveillance program. It is not     intended to diagnose MRSA     infection nor to guide or     monitor treatment for     MRSA infections.  CULTURE, BLOOD (ROUTINE X 2)     Status: None   Collection Time    05/25/12  7:50 PM      Result Value Range Status   Specimen Description BLOOD LEFT HAND   Final   Special Requests BOTTLES DRAWN AEROBIC ONLY 5CC   Final   Culture  Setup Time 05/26/2012 03:18   Final   Culture     Final   Value:         BLOOD CULTURE RECEIVED NO GROWTH TO DATE CULTURE WILL BE HELD FOR 5 DAYS BEFORE ISSUING A FINAL NEGATIVE REPORT   Report Status PENDING   Incomplete  CULTURE, BLOOD (ROUTINE X 2)     Status: None   Collection Time    05/25/12  7:57 PM      Result Value Range Status   Specimen Description BLOOD RIGHT HAND   Final   Special Requests BOTTLES DRAWN AEROBIC ONLY 3CC   Final   Culture  Setup Time 05/26/2012 03:18   Final   Culture     Final   Value:        BLOOD CULTURE RECEIVED NO GROWTH TO DATE CULTURE WILL BE HELD FOR 5 DAYS BEFORE ISSUING A FINAL NEGATIVE REPORT   Report Status PENDING   Incomplete  BODY FLUID CULTURE     Status: None   Collection Time    05/26/12  5:50 PM      Result Value Range Status   Specimen Description PLEURAL FLUID   Final   Special Requests NONE   Final   Gram Stain     Final   Value: RARE WBC PRESENT, PREDOMINANTLY MONONUCLEAR     NO ORGANISMS SEEN   Culture NO GROWTH 3 DAYS   Final   Report Status 05/30/2012 FINAL   Final  CLOSTRIDIUM DIFFICILE BY PCR     Status: Abnormal   Collection Time    05/30/12  2:44 PM      Result Value Range Status   C difficile by pcr POSITIVE (*) NEGATIVE Final   Comment: CRITICAL RESULT CALLED TO, READ BACK BY AND VERIFIED WITH:     HALLETTM RN 16:40 05/30/12 ([email or web address])   Studies/Results: Dg Chest Port 1 View  06/01/2012  *RADIOLOGY REPORT*  Clinical Data: Right chest tube  PORTABLE CHEST - 1 VIEW  Comparison: 05/31/2012  Findings: Cardiomediastinal silhouette is stable.  Dual lead cardiac pacemaker is unchanged in position.  Stable left IJ Port-A- Cath position.  Stable right pigtail catheter position.  Stable right  IJ central line position. Again noted mild interstitial edema without change in aeration. Stable basilar atelectasis or infiltrate.  IMPRESSION: Stable right pigtail catheter position.  Persistent mild congestion/edema.  No diagnostic pneumothorax.   Original Report Authenticated By: Natasha Mead, M.D.    Dg Chest Port  1 View  05/31/2012  *RADIOLOGY REPORT*  Clinical Data: Evaluate right effusion and chest tube  PORTABLE CHEST - 1 VIEW  Comparison: Portable chest x-ray of 05/30/2012  Findings: The lungs are poorly aerated and there is pulmonary vascular congestion present.  Opacity at the right lung base is most consistent with atelectasis and effusion.  Right chest tube, permanent pacemaker, right IJ central venous line, and Port-A-Cath remain.  Cardiomegaly is stable.  IMPRESSION: Slightly diminished aeration with little change in probable edema.   Original Report Authenticated By: Dwyane Dee, M.D.    Medications: I have reviewed the patient's current medications. Scheduled Meds: . ipratropium  0.5 mg Nebulization Q6H   And  . albuterol  2.5 mg Nebulization Q6H  . amLODipine  10 mg Oral Daily  . antiseptic oral rinse  15 mL Mouth Rinse BID  . atorvastatin  10 mg Oral q1800  . budesonide  0.5 mg Nebulization BID  . calcium-vitamin D  1 tablet Oral Daily  . carvedilol  12.5 mg Per Tube Q12H  . chlorhexidine  15 mL Mouth Rinse BID  . enoxaparin (LOVENOX) injection  120 mg Subcutaneous Q12H  . famotidine  40 mg Oral BID  . furosemide  40 mg Intravenous Once  . insulin aspart  0-20 Units Subcutaneous TID WC  . metroNIDAZOLE  500 mg Oral Q8H  . multivitamin  5 mL Per Tube Daily  . olopatadine  1 drop Both Eyes BID  . potassium chloride  40 mEq Oral BID  . sodium chloride  10-40 mL Intracatheter Q12H   Continuous Infusions:   PRN Meds:.acetaminophen (TYLENOL) oral liquid 160 mg/5 mL, albuterol, chlorpheniramine-HYDROcodone, ibuprofen, morphine injection, ondansetron (ZOFRAN) IV, sodium chloride   Assessment/Plan: Ms. Kugler is a 67 year old woman with a history of breast cancer x 2, carcinoid of the right lower lobe, diastolic heart failure, chronic obstructive pulmonary disease, diabetes, and complete heart block requiring dual-chamber pacemaker who presents with a several week history of increasing  shortness of breath and fatigue, and is beign treated for acute respiratory failure, a R pleural effusion, RLE DVT, and C. diff infection.   1. Bilateral pleural effusion: Pleural effusions first noted 12/2011 and were very small. Slow progressive increase in effusions since that time, R>L. S/p right chest tube, placed in the ICU on 2/7. Unfortunately the fluid was not sent off for labs. Cytology recommends collecting cytology from 'fresh' chest tube sample. Spoke with IR and will send off labs on pleural fluid from pleura vac contraption, as I don't think there is enough fluid draining from the chest tube. Over the past 24hrs. 70cc from the CT. The big question is what is the cause of this effusion. Possible it is a malignant effusion related to her breast cancer; hopefully cytology and the other labs will give [place] a better picture.  - F/u pleural fluid labs  2. ?HCAP: She was started on Vanc and Zosyn on admission to the ICU, which was changed to Ceftriaxone. She is on day 6 of 7. While we are unsure if she actually has HCAP, will complete therapy HCAP since it was begun. - Cont ceftriaxone to finish 7 day course, last day today  3. Diastolic CHF: Volume  overload on admission. BLE edema present on admission and pulm vascular congestion on CXR. ECHO performed on 05/25/12: EF 60 to 65%, grade 1 diastolic dysfx, PAS 104 mmHg. Diuresing with 40mg  IV Lasix. Total of [place] -8L. Cr stable. Will continue to diurese.  - Another 40 IV Lasix x 1 today   4. RLE DVT: Seen on BLE duplex on 2/7. She was started on a heparin drip, which was transitioned to Lovenox, which has shown improved long-term survival in cancer pts requiring long-term anticoagulation. - Lovenox per Pharmacy  5. C. diff infection: Pt been on abx for presumed HCAP. Still on Rocephin (last day). Started on Flagyl for C.diff treatment 2/11.  - Cont Flagyl   6. R invasive ductal carcinoma: Being treated by Dr. [name]. The Herceptin was  discontinued due to concerns for possible CHF. She is currently on Fulvestrant, but is actually missing a dose while in the hospital. Dr. [name] would like the pt to schedule a f/u appt once she is out of the hospital. He states missing the dose of the Fulvestrant for a week will not negatively affect the patient.  7. AKI: Resolved Cr 0.86. Will monitor, especially since we are diuresing with Lasix.   8. Hypokalemia: Hyperkalemia on admission. Hypokalemia yesterday and today. Replacing potassium.   9. Anemia: Stable. Hbg 8.0   10. Acute encephalopathy: Resolved. Thought to be 2/2 hypoxia and hypercapnea on HD #2.  11. DM: On Metformin and Januvia at home. These were held on admission. She is on resistant SSI. CBGs have been slightly elevated this admission.   12. HTN: BP slightly elevated but stable.  - Continue amlodipine and carvedilol  13. CAD: On Crestor at home.  Started Atorvastin on admission.  14. Dispo: D/c pending further clinical improvement.   The patient does have a current PCP [name], MD), therefore will be requiring OPC follow-up after discharge.   The patient does not have transportation limitations that hinder transportation to clinic appointments.  .Services Needed at time of discharge: Y = Yes, Blank = No PT:   OT:   RN:   Equipment:   Other:     LOS: 8 days   Genelle Gather 06/01/2012, 4:58 PM

## 2012-06-01 NOTE — Progress Notes (Signed)
Physical Therapy Treatment Patient Details Name: Melissa Villa MRN: [id number] DOB: 07-14-1945 Today's Date: 06/01/2012 Time: 7829-5621 PT Time Calculation (min): 32 min  PT Assessment / Plan / Recommendation Comments on Treatment Session  Pt s/p Pleural effusion, PNA, VDRF, encephalopathy.  Pt progressing with mobility ambulating to and from 3N1 today.  Slow progress but is making steady progress.      Follow Up Recommendations  SNF;Supervision/Assistance - 24 hour                 Equipment Recommendations  None recommended by PT        Frequency Min 3X/week   Plan Discharge plan remains appropriate;Frequency remains appropriate    Precautions / Restrictions Precautions Precautions: Fall Restrictions Weight Bearing Restrictions: No   Pertinent Vitals/Pain VSS, No pain    Mobility  Bed Mobility Bed Mobility: Not assessed Transfers Transfers: Sit to Stand;Stand to Sit Sit to Stand: 4: Min assist;With upper extremity assist;With armrests;From chair/3-in-1 Stand to Sit: 4: Min assist;With upper extremity assist;With armrests;To chair/3-in-1 Stand Pivot Transfers: Not tested (comment) Details for Transfer Assistance: Steadying assist needed to obtain balance as unsteady on LEs.  Widens BOS for stability.   Ambulation/Gait Ambulation/Gait Assistance: 4: Min assist Ambulation Distance (Feet): 12 Feet (12 feet x2 ) Assistive device: Rolling walker Ambulation/Gait Assistance Details: Pt was going to ambulate to bathroom but went 12 feet and became fatigued.  Obtained 3N1 and assisted pt onto 3N1 commode.  Once pt finished, total asssit to clean pt of BM and PT applied cream to pt's bottom.  Pt then ambulated back to chair.  VSS throughout on 4LO2 with sat >92%.   Gait Pattern: Step-to pattern;Wide base of support;Shuffle;Decreased step length - right;Decreased step length - left;Trunk flexed Gait velocity: decreased Stairs: No Wheelchair Mobility Wheelchair Mobility: No     Exercises General Exercises - Lower Extremity Long Arc Quad: AROM;Both;15 reps;Seated Hip Flexion/Marching: AROM;Both;15 reps;Seated   PT Goals Acute Rehab PT Goals PT Goal: Sit to Stand - Progress: Progressing toward goal PT Goal: Ambulate - Progress: Progressing toward goal PT Goal: Perform Home Exercise Program - Progress: Progressing toward goal  Visit Information  Last PT Received On: 06/01/12 Assistance Needed: +1    Subjective Data  Subjective: "I need to use the bathroom."   Cognition  Cognition Overall Cognitive Status: Appears within functional limits for tasks assessed/performed Arousal/Alertness: Awake/alert Orientation Level: Appears intact for tasks assessed Behavior During Session: [facility] for tasks performed    Balance  Dynamic Sitting Balance Dynamic Sitting - Level of Assistance: Not tested (comment) Static Standing Balance Static Standing - Balance Support: Bilateral upper extremity supported;During functional activity Static Standing - Level of Assistance: 5: Stand by assistance Static Standing - Comment/# of Minutes: Stood x 2 minutes holding RW to be cleaned of BM.    End of Session PT - End of Session Equipment Utilized During Treatment: Gait belt;Oxygen Activity Tolerance: Patient limited by fatigue Patient left: in chair;with call bell/phone within reach Nurse Communication: Mobility status        INGOLD,Haneef Hallquist 06/01/2012, 2:51 PM [facility] Acute Rehabilitation [phone] (pager)

## 2012-06-01 NOTE — Progress Notes (Signed)
Medical Student Daily Progress Note  Subjective: NAOE. Pt's spirit seems to generally be down.  She states that her breathing has improved.  Understands treatment plan.  Objective: Vital signs in last 24 hours: Filed Vitals:   05/31/12 2000 05/31/12 2229 06/01/12 0407 06/01/12 0928  BP:  146/68 157/66   Pulse:  83 75   Temp:  98.6 F (37 C) 97.6 F (36.4 C)   TempSrc:  Oral Oral   Resp:  20 20   Height:      Weight:      SpO2: 93% 94% 95% 92%   Weight change:   Intake/Output Summary (Last 24 hours) at 06/01/12 0949 Last data filed at 06/01/12 0551  Gross per 24 hour  Intake    385 ml  Output    795 ml  Net   -410 ml   Physical Exam: BP 157/66  Pulse 75  Temp(Src) 97.6 F (36.4 C) (Oral)  Resp 20  Ht 5' 4.5" (1.638 m)  Wt 125.2 kg (276 lb 0.3 oz)  BMI 46.66 kg/m2  SpO2 92% General appearance: alert, cooperative and no distress Lungs: diminished breath sounds RLL and course BS throughout, improved from yesterday Heart: regular rate and rhythm, S1, S2 normal, no murmur, click, rub or gallop Extremities: edema 2+ BL pitting  Lab Results: Results for orders placed during the hospital encounter of 05/24/12 (from the past 24 hour(s))  GLUCOSE, SEROUS FLUID     Status: None   Collection Time    05/31/12 12:19 PM      Result Value Range   Glucose, Fluid 132     Fluid Type-FGLU FLUID    BODY FLUID CELL COUNT WITH DIFFERENTIAL     Status: Abnormal   Collection Time    05/31/12 12:19 PM      Result Value Range   Fluid Type-FCT FLUID     Color, Fluid PINK (*) YELLOW   Appearance, Fluid CLOUDY (*) CLEAR   WBC, Fluid 2193 (*) 0 - 1000 cu mm   Neutrophil Count, Fluid 4  0 - 25 %   Lymphs, Fluid 86     Monocyte-Macrophage-Serous Fluid 9 (*) 50 - 90 %   Eos, Fluid 1    PROTEIN, BODY FLUID     Status: None   Collection Time    05/31/12 12:20 PM      Result Value Range   Total protein, fluid 3.8     Fluid Type-FTP FLUID    LACTATE DEHYDROGENASE, BODY FLUID      Status: Abnormal   Collection Time    05/31/12 12:20 PM      Result Value Range   LD, Fluid 351 (*) 3 - 23 U/L   Fluid Type-FLDH FLUID    GLUCOSE, CAPILLARY     Status: Abnormal   Collection Time    05/31/12 12:22 PM      Result Value Range   Glucose-Capillary 140 (*) 70 - 99 mg/dL  GLUCOSE, CAPILLARY     Status: Abnormal   Collection Time    05/31/12  6:02 PM      Result Value Range   Glucose-Capillary 216 (*) 70 - 99 mg/dL   Comment 1 Documented in Chart     Comment 2 Notify RN    GLUCOSE, CAPILLARY     Status: Abnormal   Collection Time    05/31/12  8:18 PM      Result Value Range   Glucose-Capillary 176 (*) 70 - 99 mg/dL  GLUCOSE, CAPILLARY     Status: Abnormal   Collection Time    06/01/12 12:17 AM      Result Value Range   Glucose-Capillary 112 (*) 70 - 99 mg/dL  GLUCOSE, CAPILLARY     Status: Abnormal   Collection Time    06/01/12  4:08 AM      Result Value Range   Glucose-Capillary 135 (*) 70 - 99 mg/dL  BASIC METABOLIC PANEL     Status: Abnormal   Collection Time    06/01/12  5:15 AM      Result Value Range   Sodium 142  135 - 145 mEq/L   Potassium 3.5  3.5 - 5.1 mEq/L   Chloride 102  96 - 112 mEq/L   CO2 31  19 - 32 mEq/L   Glucose, Bld 147 (*) 70 - 99 mg/dL   BUN 10  6 - 23 mg/dL   Creatinine, Ser 4.09  0.50 - 1.10 mg/dL   Calcium 9.6  8.4 - 81.1 mg/dL   GFR calc non Af Amer 64 (*) >90 mL/min   GFR calc Af Amer 75 (*) >90 mL/min  CBC     Status: Abnormal   Collection Time    06/01/12  5:15 AM      Result Value Range   WBC 6.2  4.0 - 10.5 K/uL   RBC 4.14  3.87 - 5.11 MIL/uL   Hemoglobin 8.4 (*) 12.0 - 15.0 g/dL   HCT 91.4 (*) 78.2 - 95.6 %   MCV 73.2 (*) 78.0 - 100.0 fL   MCH 20.3 (*) 26.0 - 34.0 pg   MCHC 27.7 (*) 30.0 - 36.0 g/dL   RDW 21.3 (*) 08.6 - 57.8 %   Platelets 176  150 - 400 K/uL  APTT     Status: Abnormal   Collection Time    06/01/12  5:15 AM      Result Value Range   aPTT 80 (*) 24 - 37 seconds  PROTIME-INR     Status: None    Collection Time    06/01/12  5:15 AM      Result Value Range   Prothrombin Time 13.5  11.6 - 15.2 seconds   INR 1.04  0.00 - 1.49  LACTATE DEHYDROGENASE     Status: Abnormal   Collection Time    06/01/12  6:30 AM      Result Value Range   LDH 350 (*) 94 - 250 U/L  HEPATIC FUNCTION PANEL     Status: Abnormal   Collection Time    06/01/12  6:30 AM      Result Value Range   Total Protein 7.5  6.0 - 8.3 g/dL   Albumin 2.8 (*) 3.5 - 5.2 g/dL   AST 26  0 - 37 U/L   ALT 15  0 - 35 U/L   Alkaline Phosphatase 50  39 - 117 U/L   Total Bilirubin 0.2 (*) 0.3 - 1.2 mg/dL   Bilirubin, Direct <4.6  0.0 - 0.3 mg/dL   Indirect Bilirubin NOT CALCULATED  0.3 - 0.9 mg/dL  GLUCOSE, CAPILLARY     Status: Abnormal   Collection Time    06/01/12  8:53 AM      Result Value Range   Glucose-Capillary 139 (*) 70 - 99 mg/dL   Comment 1 Notify RN     Micro Results: Recent Results (from the past 240 hour(s))  URINE CULTURE     Status: None   Collection Time  05/25/12  6:09 PM      Result Value Range Status   Specimen Description URINE, CLEAN CATCH   Final   Special Requests Normal   Final   Culture  Setup Time 05/25/2012 19:33   Final   Colony Count NO GROWTH   Final   Culture NO GROWTH   Final   Report Status 05/27/2012 FINAL   Final  MRSA PCR SCREENING     Status: None   Collection Time    05/25/12  6:15 PM      Result Value Range Status   MRSA by PCR NEGATIVE  NEGATIVE Final   Comment:            The GeneXpert MRSA Assay (FDA     approved for NASAL specimens     only), is one component of a     comprehensive MRSA colonization     surveillance program. It is not     intended to diagnose MRSA     infection nor to guide or     monitor treatment for     MRSA infections.  CULTURE, BLOOD (ROUTINE X 2)     Status: None   Collection Time    05/25/12  7:50 PM      Result Value Range Status   Specimen Description BLOOD LEFT HAND   Final   Special Requests BOTTLES DRAWN AEROBIC ONLY 5CC    Final   Culture  Setup Time 05/26/2012 03:18   Final   Culture     Final   Value:        BLOOD CULTURE RECEIVED NO GROWTH TO DATE CULTURE WILL BE HELD FOR 5 DAYS BEFORE ISSUING A FINAL NEGATIVE REPORT   Report Status PENDING   Incomplete  CULTURE, BLOOD (ROUTINE X 2)     Status: None   Collection Time    05/25/12  7:57 PM      Result Value Range Status   Specimen Description BLOOD RIGHT HAND   Final   Special Requests BOTTLES DRAWN AEROBIC ONLY 3CC   Final   Culture  Setup Time 05/26/2012 03:18   Final   Culture     Final   Value:        BLOOD CULTURE RECEIVED NO GROWTH TO DATE CULTURE WILL BE HELD FOR 5 DAYS BEFORE ISSUING A FINAL NEGATIVE REPORT   Report Status PENDING   Incomplete  BODY FLUID CULTURE     Status: None   Collection Time    05/26/12  5:50 PM      Result Value Range Status   Specimen Description PLEURAL FLUID   Final   Special Requests NONE   Final   Gram Stain     Final   Value: RARE WBC PRESENT, PREDOMINANTLY MONONUCLEAR     NO ORGANISMS SEEN   Culture NO GROWTH 3 DAYS   Final   Report Status 05/30/2012 FINAL   Final  CLOSTRIDIUM DIFFICILE BY PCR     Status: Abnormal   Collection Time    05/30/12  2:44 PM      Result Value Range Status   C difficile by pcr POSITIVE (*) NEGATIVE Final   Comment: CRITICAL RESULT CALLED TO, READ BACK BY AND VERIFIED WITH:     HALLETTM RN 16:40 05/30/12 ([email or web address])   Studies/Results: Dg Chest Port 1 View  05/31/2012  *RADIOLOGY REPORT*  Clinical Data: Evaluate right effusion and chest tube  PORTABLE CHEST - 1 VIEW  Comparison: Portable chest x-ray of  05/30/2012  Findings: The lungs are poorly aerated and there is pulmonary vascular congestion present.  Opacity at the right lung base is most consistent with atelectasis and effusion.  Right chest tube, permanent pacemaker, right IJ central venous line, and Port-A-Cath remain.  Cardiomegaly is stable.  IMPRESSION: Slightly diminished aeration with little change in probable edema.    Original Report Authenticated By: Dwyane Dee, M.D.    Medications: I have reviewed the patient's current medications. Scheduled Meds: . ipratropium  0.5 mg Nebulization Q6H   And  . albuterol  2.5 mg Nebulization Q6H  . amLODipine  10 mg Oral Daily  . antiseptic oral rinse  15 mL Mouth Rinse BID  . atorvastatin  10 mg Oral q1800  . budesonide  0.5 mg Nebulization BID  . calcium-vitamin D  1 tablet Oral Daily  . carvedilol  12.5 mg Per Tube Q12H  . cefTRIAXone (ROCEPHIN)  IV  1 g Intravenous Q24H  . chlorhexidine  15 mL Mouth Rinse BID  . enoxaparin (LOVENOX) injection  120 mg Subcutaneous Q12H  . famotidine  40 mg Oral BID  . insulin aspart  0-20 Units Subcutaneous Q4H  . metroNIDAZOLE  500 mg Oral Q8H  . multivitamin  5 mL Per Tube Daily  . olopatadine  1 drop Both Eyes BID  . potassium chloride  40 mEq Oral BID   Continuous Infusions:  PRN Meds:.acetaminophen (TYLENOL) oral liquid 160 mg/5 mL, albuterol, chlorpheniramine-HYDROcodone, ibuprofen, morphine injection, ondansetron (ZOFRAN) IV  Assessment/Plan: Mrs. Lindfors is a 67yo F with PMH pertinent for invasive ductal carcinoma of R breast, CHF, ?COPD, and OSA, who was transferred to the service from the ICU this AM and is primarily being treated for acute respiratory failure, a R pleural effusion, RLE DVT, and C. diff infection.   1. BL (R>>L) pleural effusion: The R-sided chest tube drained 195cc in last 24hrs, so it's appropriate to keep the tube in at this time. The labs are consistent with exudative effusion.  High lymphocytic component can be consistent with TB, so will send a quantiferon.  Still waiting on cytology to f/u possibility of malignant effusion.  Once the pt has become optimized, it may be useful in the future to reassess candidacy for a VATS to get a pleural bx. - F/u cytology, quantiferon - D/c chest tube per PCCM recommendations   2. ?PNA: On day 7 of abx therapy. - Finish ceftriaxone today  3. ?COPD:  Clinical sx improved.  Will continue breathing tx, as likely receiving some sx benefit.  - Continue duonebs  - Continue budesonide   3. dCHF: Evidence of volume overload on exam (2+ pitting edema), so will likely benefit from additional diuresis.   - 40 IV Lasix x 1 today   4. RLE DVT: Stable.  - Continue Lovenox  5. C. diff infection: Stable. No episodes of diarrhea this AM.  - Cont Flagyl   6. R invasive ductal carcinoma: NTD.  Will schedule fulvestrant therapy on d/c.  7. AKI: Resolved, Cr at 0.92 this AM.  - Continue to monitor and avoid nephrotoxic drugs   8. Hypokalemia: 3.5 this AM.  - Replete K   9. Anemia: Stable with Hgb 8.4.   11. DM: Moderate control. - Continue SSI   12. HTN: Running 140-150/60s overnight.  - Continue amlodipine and carvedilol  13. CAD: Stable.  - Continue atorvastatin.   14. FEN/GI: Stable.  - Regular diet  - Continue ranitidine  15. Dispo: Floor status.  LOS: 8 days   This is a Psychologist, occupational Note.  The care of the patient was discussed with Dr. Sherrine Maples and the assessment and plan formulated with their assistance.  Please see their attached note for official documentation of the daily encounter.  Wynona Meals Amy 06/01/2012, 9:49 AM \

## 2012-06-01 NOTE — Progress Notes (Signed)
Resident Co-sign Daily Note: I have seen the patient and reviewed the daily progress note by Jaye Beagle, MS-III and discussed the care of the patient with her.  See my separate note for documentation of my findings, assessment, and plans.   LOS: 8 days   Genelle Gather 06/01/2012, 2:23 PM

## 2012-06-01 NOTE — Progress Notes (Addendum)
PULMONARY  / CRITICAL CARE MEDICINE  Name: Melissa Villa MRN: [id number] DOB: 09-04-1945    ADMISSION DATE:  05/24/2012 CONSULTATION DATE:  05/25/2012  REFERRING MD :  Mariana Arn  CHIEF COMPLAINT:  Short or breath  BRIEF PATIENT DESCRIPTION:  67 y/o female former smoker admitted with dry cough, dyspnea, and weakness.  Developed progressive hypoxic/hypercapnic respiratory failure, fever (Tm101.50F) and acute encephalopathy.  Transferred to ICU 2/06 and PCCM assumed medical care.   She was in hospital in December 2013 for PNA, Rt Empyema, AECOPD, and CHF exacerbation.  SIGNIFICANT EVENTS: 2/05 -  Admit 2/06 - Transfer to ICU, VDRF 2/08 - Tolerating PS wean 5/5 40% 2/10 - extubated  STUDIES:  2/06 Echo >> EF 60 to 65%, grade 1 diastolic dysfx, PAS 104 mmHg 2/06 CT chest >> mod Rt effusion, small Lt effusion  LINES / TUBES: ETT 2/06 >>2/10 Rt IJ CVL 2/06 >>  L Chest Port >>> R Chest Tube 2/7 >>  CULTURES: Blood 2/06 >>  No growth 5 days Sputum 2/06>> Canceled UC 2/6>>>neg Rt pleural fluid 2/7>> No growth 3 days, rare WBC  ANTIBIOTICS: Vancomycin 2/06 >>2/10 Zosyn 2/06 >> 2/10 ceftriaxone 2/10>>>2/13  SUBJECTIVE: complains of dry, non-productive cough, feels achy    VITAL SIGNS: Temp:  [97.6 F (36.4 C)-98.6 F (37 C)] 97.6 F (36.4 C) (02/13 0407) Pulse Rate:  [75-83] 75 (02/13 0407) Resp:  [20] 20 (02/13 0407) BP: (146-157)/(66-69) 157/66 mmHg (02/13 0407) SpO2:  [92 %-97 %] 92 % (02/13 0928)  INTAKE / OUTPUT: Intake/Output     02/12 0701 - 02/13 0700 02/13 0701 - 02/14 0700   P.O. 240    I.V. (mL/kg) 145 (1.2)    IV Piggyback     Total Intake(mL/kg) 385 (3.1)    Urine (mL/kg/hr) 600 (0.2)    Stool     Chest Tube 195 (0.1)    Total Output 795     Net -410          Stool Occurrence  1 x     PHYSICAL EXAMINATION: General:  No distress at rest in chair Neuro:  Follows commands, awake, alert HEENT:  obese Cardiovascular:  s1s2 regular, no  murmur Lungs:  B/l rhonchi,R CT x1 Abdomen:  Obese, soft, decreased bowel sounds Musculoskeletal:  No edema Skin: post-XRT changes Rt chest  LABS:  Recent Labs Lab 05/29/12 0455 05/30/12 0443 05/30/12 2111 05/31/12 0505 06/01/12 0515  NA 144 147* 141 142 142  K 3.7 3.4* 3.3* 3.3* 3.5  CL 106 104 102 103 102  CO2 32 31 33* 32 31  GLUCOSE 131* 108* 135* 140* 147*  BUN 15 10 8 8 10   CREATININE 1.19* 0.90 0.85 0.86 0.92  CALCIUM 8.9 9.2 9.3 9.3 9.6  MG  --   --  1.8 1.7  --   PHOS  --   --   --  2.8  --     Recent Labs Lab 05/30/12 0443 05/31/12 0505 06/01/12 0515  HGB 7.7* 8.0* 8.4*  HCT 28.0* 29.4* 30.3*  WBC 6.7 6.5 6.2  PLT 179 171 176   Imaging:  Dg Chest Port 1 View  05/31/2012  *RADIOLOGY REPORT*  Clinical Data: Evaluate right effusion and chest tube  PORTABLE CHEST - 1 VIEW  Comparison: Portable chest x-ray of 05/30/2012  Findings: The lungs are poorly aerated and there is pulmonary vascular congestion present.  Opacity at the right lung base is most consistent with atelectasis and effusion.  Right chest tube,  permanent pacemaker, right IJ central venous line, and Port-A-Cath remain.  Cardiomegaly is stable.  IMPRESSION: Slightly diminished aeration with little change in probable edema.   Original Report Authenticated By: Dwyane Dee, M.D.      ASSESSMENT / PLAN:  PULMONARY Acute on chronic respiratory failure 2nd to PNA and recurrent Rt pleural effusion. Secondary pulmonary hypertension - PA Pressure 104 on ECHO 2/6 Hx of COPD. Hx of OSA/OHS.  P:   IS When output less 150 / 24 hrs, consider dc Chest tube, keep to water seal for now CXR daily Continue lasix  CARDIOVASCULAR  Hx of CAD, HTN, Diastolic CHF. Hypotension after intubation 2/06 >> Resolved 2/07.  P:  continue lasix Get CVL out Lipitor, coreg Continue amlodipine  RENAL    Acute on chronic renal failure >> likely from diuresis.  Improved. Hyperkalemia >> resolved.  Now mild  hypokalemia  P:   K supp to cont  kvo lasix  GASTROINTESTINAL    Morbid obesity. Nutrition. C-Diff positive stool  P:   Diet advance carb mod ppi Cdiff positive, flagyl per primary team  HEMATOLOGIC    Anemia of chronic disease and critical illness. PRBC transfusion 2/07.  No current evidence for bleeding. Hx of Breast cancer. thrombocytopenia  P:  I agree with lovenox in this patient and get off heparin drip Lasix to hemoconcetrate  INFECTIOUS A:   HCAP - culture neg,  Effusion -Exudate by numbers, suspect parapneumonic effusion but no empyema. C/s neg from pleural fluid C-Diff   P:   -ceftriaxone >stop date 13th -abx as above, per primary svc -get CVL and foley OUT  ENDOCRINE  DM type II with hyperglycemia.  P:   SSI, currently well controlled  NEUROLOGIC Acute encephalopathy 2nd to hypercapnia. RESOLVED.   P:   monitor  SUMMARY:  67 y/o F with pleural effusion on vent s/p CT per IR.  RLE DVT.  S/p brief vent stay. Now improved and tfr to floor under IMTS care, PCCM will stay on as pulm conuslt for lung / effusion. I support transition to lovenox from heparin drip. C-diff positive stool.  Keep Chest tube until output from effusion < 150/24hrs  PCCM will continue to follow for pulmonary needs and Chest tube management  Dorcas Carrow Beeper  [phone]  Cell  [phone]  If no response or cell goes to voicemail, call beeper [phone] 06/01/2012

## 2012-06-01 NOTE — Progress Notes (Signed)
Internal Medicine Attending  Date: 06/01/2012  Patient name: Melissa Villa Medical record number: [id number] Date of birth: November 05, 1945 Age: 67 y.o. Gender: female  I saw and evaluated the patient. I reviewed the resident's note by Dr. Sherrine Maples [name] agree with the resident's findings and plans as documented in her progress note.  [name] is without any new complaints today. Her breathing is stable. Her lungs are remarkably clear except for some decreased breath sounds on the right. She has no air leak on the waterseal. Cytology is pending but we found that her pleural effusion is exudative and lymphocyte predominant. Although this is nonspecific we will assess for evidence of tuberculosis while we await the cytology. We will continue therapy for her C. difficile infection. Once she is more clinically stable we will ask thoracic surgery to reconsider the issue of VATS for pleural biopsy if neither her cytology nor tuberculosis workup is diagnostic. The chest tube will remain in place at this time because of the continued output.

## 2012-06-01 NOTE — Progress Notes (Signed)
Rn called IP today to verify if patient needed to go on airborne precautions because of testing that it being done. Per Melissa IP patient is to be put on r/o airborne precautions. Rn notified md and orders were placed. No bed is available at this time. For the time being Rn put airborne sign up and placed masks in cabinet for staff to wear.

## 2012-06-01 NOTE — Progress Notes (Signed)
Subjective: No new complaints.  Objective: Vital signs in last 24 hours: Temp:  [97.6 F (36.4 C)-98.6 F (37 C)] 97.6 F (36.4 C) (02/13 0407) Pulse Rate:  [75-83] 75 (02/13 0407) Resp:  [20] 20 (02/13 0407) BP: (146-157)/(66-69) 157/66 mmHg (02/13 0407) SpO2:  [92 %-97 %] 92 % (02/13 0928) Last BM Date: 05/31/12  Intake/Output from previous day: 02/12 0701 - 02/13 0700 In: 385 [P.O.:240; I.V.:145] Out: 795 [Urine:600; Chest Tube:195] Intake/Output this shift:    Exam:  Chest tube is intact and on water seal.  Lab Results:   Recent Labs  05/31/12 0505 06/01/12 0515  WBC 6.5 6.2  HGB 8.0* 8.4*  HCT 29.4* 30.3*  PLT 171 176   BMET  Recent Labs  05/31/12 0505 06/01/12 0515  NA 142 142  K 3.3* 3.5  CL 103 102  CO2 32 31  GLUCOSE 140* 147*  BUN 8 10  CREATININE 0.86 0.92  CALCIUM 9.3 9.6   PT/INR  Recent Labs  06/01/12 0515  LABPROT 13.5  INR 1.04   ABG No results found for this basename: PHART, PCO2, PO2, HCO3,  in the last 72 hours  Studies/Results: Dg Chest Port 1 View  06/01/2012  *RADIOLOGY REPORT*  Clinical Data: Right chest tube  PORTABLE CHEST - 1 VIEW  Comparison: 05/31/2012  Findings: Cardiomediastinal silhouette is stable.  Dual lead cardiac pacemaker is unchanged in position.  Stable left IJ Port-A- Cath position.  Stable right pigtail catheter position.  Stable right IJ central line position. Again noted mild interstitial edema without change in aeration. Stable basilar atelectasis or infiltrate.  IMPRESSION: Stable right pigtail catheter position.  Persistent mild congestion/edema.  No diagnostic pneumothorax.   Original Report Authenticated By: Natasha Mead, M.D.    Dg Chest Port 1 View  05/31/2012  *RADIOLOGY REPORT*  Clinical Data: Evaluate right effusion and chest tube  PORTABLE CHEST - 1 VIEW  Comparison: Portable chest x-ray of 05/30/2012  Findings: The lungs are poorly aerated and there is pulmonary vascular congestion present.   Opacity at the right lung base is most consistent with atelectasis and effusion.  Right chest tube, permanent pacemaker, right IJ central venous line, and Port-A-Cath remain.  Cardiomegaly is stable.  IMPRESSION: Slightly diminished aeration with little change in probable edema.   Original Report Authenticated By: Dwyane Dee, M.D.     Anti-infectives: Anti-infectives   Start     Dose/Rate Route Frequency Ordered Stop   05/30/12 2200  metroNIDAZOLE (FLAGYL) tablet 500 mg     500 mg Oral 3 times per day 05/30/12 1813 06/13/12 2159   05/29/12 1400  cefTRIAXone (ROCEPHIN) 1 g in dextrose 5 % 50 mL IVPB     1 g 100 mL/hr over 30 Minutes Intravenous Every 24 hours 05/29/12 1215 06/02/12 1359   05/28/12 1200  vancomycin (VANCOCIN) 1,250 mg in sodium chloride 0.9 % 250 mL IVPB  Status:  Discontinued     1,250 mg 166.7 mL/hr over 90 Minutes Intravenous Every 12 hours 05/28/12 1051 05/29/12 1214   05/27/12 2000  vancomycin (VANCOCIN) 1,750 mg in sodium chloride 0.9 % 500 mL IVPB  Status:  Discontinued     1,750 mg 250 mL/hr over 120 Minutes Intravenous Every 48 hours 05/25/12 1858 05/27/12 1111   05/27/12 1200  vancomycin (VANCOCIN) 1,750 mg in sodium chloride 0.9 % 500 mL IVPB  Status:  Discontinued     1,750 mg 250 mL/hr over 120 Minutes Intravenous Every 48 hours 05/27/12 1110 05/28/12 1050  05/25/12 2200  piperacillin-tazobactam (ZOSYN) IVPB 3.375 g  Status:  Discontinued     3.375 g 12.5 mL/hr over 240 Minutes Intravenous 3 times per day 05/25/12 1858 05/29/12 1214   05/25/12 2000  vancomycin (VANCOCIN) 2,500 mg in sodium chloride 0.9 % 500 mL IVPB     2,500 mg 250 mL/hr over 120 Minutes Intravenous  Once 05/25/12 1858 05/25/12 2200      Assessment/Plan: Post right chest drain.  There is persistent drainage from tube.  Continue to follow and plan to removal when output decreases.    LOS: 8 days    Artis Buechele RYAN 06/01/2012

## 2012-06-02 ENCOUNTER — Ambulatory Visit: Payer: Self-pay

## 2012-06-02 ENCOUNTER — Inpatient Hospital Stay (HOSPITAL_COMMUNITY): Payer: Medicare Other

## 2012-06-02 ENCOUNTER — Other Ambulatory Visit: Payer: Self-pay | Admitting: Lab

## 2012-06-02 LAB — CULTURE, BLOOD (ROUTINE X 2)
Culture: NO GROWTH
Culture: NO GROWTH

## 2012-06-02 LAB — BASIC METABOLIC PANEL
BUN: 6 mg/dL (ref 6–23)
Creatinine, Ser: 0.8 mg/dL (ref 0.50–1.10)
GFR calc Af Amer: 88 mL/min — ABNORMAL LOW (ref >90)
GFR calc non Af Amer: 76 mL/min — ABNORMAL LOW (ref >90)
Potassium: 4 mEq/L (ref 3.5–5.1)

## 2012-06-02 LAB — GLUCOSE, CAPILLARY
Glucose-Capillary: 169 mg/dL — ABNORMAL HIGH (ref 70–99)
Glucose-Capillary: 176 mg/dL — ABNORMAL HIGH (ref 70–99)
Glucose-Capillary: 201 mg/dL — ABNORMAL HIGH (ref 70–99)
Glucose-Capillary: 136 mg/dL — ABNORMAL HIGH (ref 70–99)

## 2012-06-02 LAB — CBC
Hemoglobin: 9.1 g/dL — ABNORMAL LOW (ref 12.0–15.0)
MCV: 72.3 fL — ABNORMAL LOW (ref 78.0–100.0)
Platelets: 208 10*3/uL (ref 150–400)
RBC: 4.52 MIL/uL (ref 3.87–5.11)
WBC: 7 10*3/uL (ref 4.0–10.5)

## 2012-06-02 MED ORDER — ACETAMINOPHEN 160 MG/5ML PO SOLN
650.0000 mg | Freq: Four times a day (QID) | ORAL | Status: DC | PRN
  Filled 2012-06-02: qty 20.3

## 2012-06-02 MED ORDER — IPRATROPIUM BROMIDE 0.02 % IN SOLN: 0.5000 mg | Freq: Two times a day (BID) | RESPIRATORY_TRACT | Status: DC

## 2012-06-02 MED ORDER — ALBUTEROL SULFATE (5 MG/ML) 0.5% IN NEBU
2.5000 mg | INHALATION_SOLUTION | Freq: Two times a day (BID) | RESPIRATORY_TRACT | Status: DC
  Administered 2012-06-02 – 2012-06-06 (×8): 2.5 mg via RESPIRATORY_TRACT
  Filled 2012-06-02 (×8): qty 0.5

## 2012-06-02 MED ORDER — ALBUTEROL SULFATE (5 MG/ML) 0.5% IN NEBU: 2.5000 mg | INHALATION_SOLUTION | Freq: Two times a day (BID) | RESPIRATORY_TRACT | Status: DC

## 2012-06-02 MED ORDER — IPRATROPIUM BROMIDE 0.02 % IN SOLN
0.5000 mg | Freq: Two times a day (BID) | RESPIRATORY_TRACT | Status: DC
  Administered 2012-06-02 – 2012-06-06 (×8): 0.5 mg via RESPIRATORY_TRACT
  Filled 2012-06-02 (×8): qty 2.5

## 2012-06-02 MED ORDER — PANTOPRAZOLE SODIUM 40 MG PO TBEC
40.0000 mg | DELAYED_RELEASE_TABLET | Freq: Every day | ORAL | Status: DC
  Administered 2012-06-02 – 2012-06-06 (×5): 40 mg via ORAL
  Filled 2012-06-02 (×5): qty 1

## 2012-06-02 MED ORDER — ALBUTEROL SULFATE (5 MG/ML) 0.5% IN NEBU: 2.5000 mg | INHALATION_SOLUTION | Freq: Four times a day (QID) | RESPIRATORY_TRACT | Status: DC

## 2012-06-02 MED ORDER — CITALOPRAM HYDROBROMIDE 10 MG PO TABS
10.0000 mg | ORAL_TABLET | Freq: Every day | ORAL | Status: DC
  Administered 2012-06-02 – 2012-06-06 (×5): 10 mg via ORAL
  Filled 2012-06-02 (×7): qty 1

## 2012-06-02 MED ORDER — OXYCODONE HCL 5 MG PO TABS
5.0000 mg | ORAL_TABLET | Freq: Four times a day (QID) | ORAL | Status: DC | PRN
  Administered 2012-06-02 – 2012-06-06 (×10): 10 mg via ORAL
  Filled 2012-06-02 (×11): qty 2

## 2012-06-02 NOTE — Progress Notes (Signed)
  Subjective: Loculated Rt effusion Chest tube drain placed 2/7 Output still significant Up in chair  Objective: Vital signs in last 24 hours: Temp:  [97.5 F (36.4 C)-97.9 F (36.6 C)] 97.9 F (36.6 C) (02/13 2249) Pulse Rate:  [76-91] 90 (02/14 1050) Resp:  [18-19] 18 (02/13 2249) BP: (144-162)/(61-66) 162/66 mmHg (02/14 1050) SpO2:  [93 %-97 %] 94 % (02/14 1050) FiO2 (%):  [28 %] 28 % (02/14 0320) Last BM Date: 06/01/12  Intake/Output from previous day: 02/13 0701 - 02/14 0700 In: 120 [P.O.:120] Out: 500 [Urine:450; Chest Tube:50] Intake/Output this shift:  PE:  Afeb; vss Rt chest tube intact No air leak Serous fluid output 1250 cc in pleurvac (50 cc yesterday) CXR 2/14: edema less Up in chair Wbc wnl     Lab Results:   Recent Labs  06/01/12 0515 06/02/12 0500  WBC 6.2 7.0  HGB 8.4* 9.1*  HCT 30.3* 32.7*  PLT 176 208   BMET  Recent Labs  05/31/12 0505 06/01/12 0515  NA 142 142  K 3.3* 3.5  CL 103 102  CO2 32 31  GLUCOSE 140* 147*  BUN 8 10  CREATININE 0.86 0.92  CALCIUM 9.3 9.6   PT/INR  Recent Labs  06/01/12 0515  LABPROT 13.5  INR 1.04   ABG No results found for this basename: PHART, PCO2, PO2, HCO3,  in the last 72 hours  Studies/Results: Dg Chest Port 1 View  06/02/2012  *RADIOLOGY REPORT*  Clinical Data: Right chest tube  PORTABLE CHEST - 1 VIEW  Comparison: Yesterday  Findings: Left subclavian vein Port-A-Cath, right subclavian dual lead pacemaker and leads, right internal jugular central venous catheter, and right chest tube are stable.  There is no pneumothorax.  Volume loss in the right lung with central and basilar dense opacity are stable.    Diffuse edema has improved.  IMPRESSION: Improved edema.  No pneumothorax.  Stable chest tube.   Original Report Authenticated By: Jolaine Click, M.D.    Dg Chest Port 1 View  06/01/2012  *RADIOLOGY REPORT*  Clinical Data: Right chest tube  PORTABLE CHEST - 1 VIEW  Comparison:  05/31/2012  Findings: Cardiomediastinal silhouette is stable.  Dual lead cardiac pacemaker is unchanged in position.  Stable left IJ Port-A- Cath position.  Stable right pigtail catheter position.  Stable right IJ central line position. Again noted mild interstitial edema without change in aeration. Stable basilar atelectasis or infiltrate.  IMPRESSION: Stable right pigtail catheter position.  Persistent mild congestion/edema.  No diagnostic pneumothorax.   Original Report Authenticated By: Natasha Mead, M.D.     Anti-infectives:   Assessment/Plan: s/p * No surgery found *  Rt chest tube for loc effusion placed 2/7 Intact Plan per TS/PCCM Will follow 1250 cc in pleurvac today   LOS: 9 days    Priti Consoli A 06/02/2012

## 2012-06-02 NOTE — Progress Notes (Signed)
PULMONARY  / CRITICAL CARE MEDICINE  Name: Melissa Villa MRN: [id number] DOB: 07-09-1945    ADMISSION DATE:  05/24/2012 CONSULTATION DATE:  05/25/2012  REFERRING MD :  Mariana Arn  CHIEF COMPLAINT:  Short or breath  BRIEF PATIENT DESCRIPTION:  67 y/o female former smoker admitted with dry cough, dyspnea, and weakness.  Developed progressive hypoxic/hypercapnic respiratory failure, fever (Tm101.90F) and acute encephalopathy.  Transferred to ICU 2/06 and PCCM assumed medical care.   She was in hospital in December 2013 for PNA, Rt Empyema, AECOPD, and CHF exacerbation.  SIGNIFICANT EVENTS: 2/05 -  Admit 2/06 - Transfer to ICU, VDRF 2/08 - Tolerating PS wean 5/5 40% 2/10 - extubated 2/14 - PCCM service signing off/ on Teaching service   STUDIES:  2/06 Echo >> EF 60 to 65%, grade 1 diastolic dysfx, PAS 104 mmHg 2/06 CT chest >> mod Rt effusion, small Lt effusion  LINES / TUBES: ETT 2/06 >>2/10 Rt IJ CVL 2/06 >>  R Chest Tube 2/7 >>  CULTURES: Blood 2/06 >>  Neg Sputum 2/06>> Canceled UC 2/6>>>neg Rt pleural fluid 2/7>> No growth 3 days, rare WBC C diff 2/11 > POS  ANTIBIOTICS: Vancomycin 2/06 >>2/10 Zosyn 2/06 >> 2/10 ceftriaxone 2/10>>>2/13  SUBJECTIVE: Comfortable in chair, immediately sob lying flat despite rx with her cpap (per Dr Maple Hudson)  VITAL SIGNS: Temp:  [97.5 F (36.4 C)-97.9 F (36.6 C)] 97.9 F (36.6 C) (02/13 2249) Pulse Rate:  [76-91] 91 (02/13 2249) Resp:  [18-19] 18 (02/13 2249) BP: (144-159)/(61-63) 159/63 mmHg (02/13 2249) SpO2:  [93 %-97 %] 96 % (02/14 0939) FiO2 (%):  [28 %] 28 % (02/14 0320)  INTAKE / OUTPUT: Intake/Output     02/13 0701 - 02/14 0700 02/14 0701 - 02/15 0700   P.O. 120    I.V. (mL/kg)     Total Intake(mL/kg) 120 (1)    Urine (mL/kg/hr) 450 (0.1)    Chest Tube 50 (0)    Total Output 500 0   Net -380 0        Urine Occurrence 2 x    Stool Occurrence 2 x      PHYSICAL EXAMINATION: General:  No distress at rest in  chair Neuro:  Follows commands, awake, alert HEENT:  obese Cardiovascular:  s1s2 regular, no murmur Lungs:  Decreased bs R base/ ow clear Abdomen:  Obese, soft, decreased bowel sounds Musculoskeletal:  No edema Skin: post-XRT changes Rt chest  LABS:  Recent Labs Lab 05/29/12 0455 05/30/12 0443 05/30/12 2111 05/31/12 0505 06/01/12 0515  NA 144 147* 141 142 142  K 3.7 3.4* 3.3* 3.3* 3.5  CL 106 104 102 103 102  CO2 32 31 33* 32 31  GLUCOSE 131* 108* 135* 140* 147*  BUN 15 10 8 8 10   CREATININE 1.19* 0.90 0.85 0.86 0.92  CALCIUM 8.9 9.2 9.3 9.3 9.6  MG  --   --  1.8 1.7  --   PHOS  --   --   --  2.8  --     Recent Labs Lab 05/31/12 0505 06/01/12 0515 06/02/12 0500  HGB 8.0* 8.4* 9.1*  HCT 29.4* 30.3* 32.7*  WBC 6.5 6.2 7.0  PLT 171 176 208   Imaging:  Dg Chest Port 1 View  06/02/2012  *RADIOLOGY REPORT*  Clinical Data: Right chest tube  PORTABLE CHEST - 1 VIEW  Comparison: Yesterday  Findings: Left subclavian vein Port-A-Cath, right subclavian dual lead pacemaker and leads, right internal jugular central venous catheter, and right  chest tube are stable.  There is no pneumothorax.  Volume loss in the right lung with central and basilar dense opacity are stable.    Diffuse edema has improved.  IMPRESSION: Improved edema.  No pneumothorax.  Stable chest tube.   Original Report Authenticated By: Jolaine Click, M.D.    Dg Chest Port 1 View  06/01/2012  *RADIOLOGY REPORT*  Clinical Data: Right chest tube  PORTABLE CHEST - 1 VIEW  Comparison: 05/31/2012  Findings: Cardiomediastinal silhouette is stable.  Dual lead cardiac pacemaker is unchanged in position.  Stable left IJ Port-A- Cath position.  Stable right pigtail catheter position.  Stable right IJ central line position. Again noted mild interstitial edema without change in aeration. Stable basilar atelectasis or infiltrate.  IMPRESSION: Stable right pigtail catheter position.  Persistent mild congestion/edema.  No diagnostic  pneumothorax.   Original Report Authenticated By: Natasha Mead, M.D.      ASSESSMENT / PLAN:  PULMONARY Acute on chronic respiratory failure 2nd to PNA and recurrent Rt pleural effusion. Secondary pulmonary hypertension - PA Pressure 104 on ECHO 2/6 Hx of COPD. Hx of OSA/OHS.  P:   IS When output less 150 / 24 hrs, consider dc Chest tube, keep to water seal for now CXR daily Continue lasix Continue home cpap but may need autoset bipap if can't get back to baseline sleep pattern pending f/u with Dr Maple Hudson as outpt  CARDIOVASCULAR  Hx of CAD, HTN, Diastolic CHF. Hypotension after intubation 2/06 >> Resolved 2/07.  P:  continue lasix Lipitor, coreg Continue amlodipine rx per Teaching service   RENAL    Acute on chronic renal failure >> likely from diuresis.  Improved. Hyperkalemia >> resolved.  Now mild hypokalemia  P:   K supp to cont  kvo lasix  GASTROINTESTINAL   - C-Diff positive stool  P:   ppi changed to pepcid flagyl per primary team  HEMATOLOGIC    Anemia of chronic disease and critical illness. PRBC transfusion 2/07.  No current evidence for bleeding. Hx of Breast cancer. thrombocytopenia  P:   agree with lovenox/ off heparin drip    INFECTIOUS A:   HCAP - culture neg,  Effusion -Exudate by numbers, suspect parapneumonic effusion but no empyema. C/s neg from pleural fluid C-Diff   P:   -ceftriaxone >stopped 2/13 -abx as above, per primary svc -get CVL and foley OUT when feasible  ENDOCRINE  DM type II with hyperglycemia.  P:   SSI,   NEUROLOGIC Acute encephalopathy 2nd to hypercapnia. RESOLVED.   P:   monitor  SUMMARY:  67 y/o F with pleural effusion on vent s/p CT per IR.  RLE DVT.  S/p brief vent stay. Now improved and tfr to floor under IMTS care,   Keep Chest tube until output from effusion < 150/24hrs  Discussed with Dr Josem Kaufmann > if fails to resolve effusion/ct outpu  will need vats per T surgery PCCM signing off - will need  outpt f/u with Dr Maple Hudson for osa/ ohs  Casimiro Needle WertMD Beeper  [phone]  Cell  [phone]  If no response or cell goes to voicemail, call beeper [phone] 06/02/2012

## 2012-06-02 NOTE — Progress Notes (Signed)
NUTRITION FOLLOW UP  Intervention:   1.  Meals/snacks; encourage intake of food and beverages as able.  Care order placed to request hs snack be provided to pt consistently.  Nutrition Dx:   Inadequate oral intake, ongoing with significant improvement  Monitor:   1.  Food/Beverage; pt to consuming>/=75% of meals to meet >/=90% estimated needs.  Somewhat met, ongoing.  Assessment:   Pt admitted with shortness of breath and fatigue.  Pt with h/o of ductal carcinoma. Pt developed respiratory failure requiring ventilator support (2/6-2/10).  Pt has since been extubated and diet advanced to CHO Mod Medium.  Pt is s/p chest tube placement yesterday.  Pt intake was 75% of lunch meal yesterday.  Pt followed by outpatient RD through Internal Medicine for DM management and wt loss who has checked on pt since admission.   Pt unavailable at time of visit- in with RN.  RD to follow.  Height: Ht Readings from Last 1 Encounters:  05/24/12 5' 4.5" (1.638 m)    Weight Status:   Wt Readings from Last 1 Encounters:  05/30/12 276 lb 0.3 oz (125.2 kg)    Re-estimated needs:  Kcal: 2080-2390 Protein: 89-104g Fluid: >2.0 L/day  Skin: edema, incision  Diet Order: Carb Control   Intake/Output Summary (Last 24 hours) at 06/02/12 1154 Last data filed at 06/02/12 0704  Gross per 24 hour  Intake    120 ml  Output    500 ml  Net   -380 ml    Last BM: 2/13   Labs:   Recent Labs Lab 05/30/12 2111 05/31/12 0505 06/01/12 0515  NA 141 142 142  K 3.3* 3.3* 3.5  CL 102 103 102  CO2 33* 32 31  BUN 8 8 10   CREATININE 0.85 0.86 0.92  CALCIUM 9.3 9.3 9.6  MG 1.8 1.7  --   PHOS  --  2.8  --   GLUCOSE 135* 140* 147*    CBG (last 3)   Recent Labs  06/01/12 1659 06/01/12 2249 06/02/12 0833  GLUCAP 206* 130* 169*    Scheduled Meds: . albuterol  2.5 mg Nebulization BID   And  . ipratropium  0.5 mg Nebulization BID  . amLODipine  10 mg Oral Daily  . antiseptic oral rinse  15 mL  Mouth Rinse BID  . atorvastatin  10 mg Oral q1800  . budesonide  0.5 mg Nebulization BID  . calcium-vitamin D  1 tablet Oral Daily  . carvedilol  12.5 mg Per Tube Q12H  . chlorhexidine  15 mL Mouth Rinse BID  . citalopram  10 mg Oral Daily  . enoxaparin (LOVENOX) injection  120 mg Subcutaneous Q12H  . furosemide  80 mg Oral Once  . insulin aspart  0-20 Units Subcutaneous TID WC  . metroNIDAZOLE  500 mg Oral Q8H  . multivitamin  5 mL Per Tube Daily  . olopatadine  1 drop Both Eyes BID  . pantoprazole  40 mg Oral Daily  . potassium chloride  40 mEq Oral BID  . sodium chloride  10-40 mL Intracatheter Q12H    Continuous Infusions:   Loyce Dys, MS RD LDN Clinical Inpatient Dietitian Pager: [phone] Weekend/After hours pager: [phone]

## 2012-06-02 NOTE — Progress Notes (Signed)
Internal Medicine Attending  Date: 06/02/2012  Patient name: Melissa Villa Medical record number: [id number] Date of birth: June 21, 1945 Age: 67 y.o. Gender: female  I saw and evaluated the patient. I reviewed the resident's note by Melissa Villa [name] agree with the resident's findings and plans as documented in her progress note.  Melissa Villa states her breathing has been stable. She has no difficulty breathing when she is sitting up but does get dyspneic with lying down. She's only had 50 cc drained from her chest tube in the last 24 hours suggesting that he can be pulled by interventional radiology. Cytology was canceled but her smear was without malignant cells. The cause of the exudative lymphocyte predominant pleural effusion remains undiagnosed. If it does not reaccumulate we may never find the source. If it begins to reaccumulate we will again ask for a VATS pleural biopsy to obtained pleural tissue to assess for cancer and tuberculosis. We will treat her depression with the initiation of an SSRI. Otherwise we will continue supportive care awaiting for removal of the chest tube and assessment of her response.

## 2012-06-02 NOTE — Progress Notes (Signed)
Subjective: This morning she states that she is doing ok. She states that her breathing is unchanged from the past few days. She states that her breathing is fine while sitting but she cannot lay down due to significant difficulty breathing.  Objective: Vital signs in last 24 hours: Filed Vitals:   06/01/12 2249 06/02/12 0320 06/02/12 0939 06/02/12 1050  BP: 159/63   162/66  Pulse: 91   90  Temp: 97.9 F (36.6 C)     TempSrc: Axillary     Resp: 18     Height:      Weight:      SpO2: 93% 94% 96% 94%   Weight change:   Intake/Output Summary (Last 24 hours) at 06/02/12 1141 Last data filed at 06/02/12 0704  Gross per 24 hour  Intake    120 ml  Output    500 ml  Net   -380 ml   Vitals reviewed. General: Sitting up in a chair, NAD HEENT: PERRL, EOMI, no scleral icterus Cardiac: RRR, no rubs, murmurs or gallops Pulm: Diffusely coarse breath sounds, productive cough, diminished at the bases Abd: Obese, soft, nontender Ext: Warm and well perfused, 1+pitting edema in BLE Neuro: Alert and oriented X3, non-focal  Lab Results: Basic Metabolic Panel:  Recent Labs Lab 05/30/12 2111 05/31/12 0505 06/01/12 0515  NA 141 142 142  K 3.3* 3.3* 3.5  CL 102 103 102  CO2 33* 32 31  GLUCOSE 135* 140* 147*  BUN 8 8 10   CREATININE 0.85 0.86 0.92  CALCIUM 9.3 9.3 9.6  MG 1.8 1.7  --   PHOS  --  2.8  --    Liver Function Tests:  Recent Labs Lab 05/30/12 0443 06/01/12 0630  AST 23 26  ALT 13 15  ALKPHOS 44 50  BILITOT 0.2* 0.2*  PROT 6.9 7.5  ALBUMIN 2.5* 2.8*   CBC:  Recent Labs Lab 06/01/12 0515 06/02/12 0500  WBC 6.2 7.0  HGB 8.4* 9.1*  HCT 30.3* 32.7*  MCV 73.2* 72.3*  PLT 176 208   Cardiac Enzymes:  Recent Labs Lab 05/27/12 2121 05/28/12 0301 05/28/12 0857  TROPONINI <0.30 <0.30 <0.30   ProBNP:  03/22/12: 193.6  04/02/12: 441  05/24/12:  442  CBG:  Recent Labs Lab 06/01/12 0408 06/01/12 0853 06/01/12 1214 06/01/12 1659 06/01/12 2249  06/02/12 0833  GLUCAP 135* 139* 167* 206* 130* 169*   Anemia Panel:  Recent Labs Lab 05/28/12 0800  FERRITIN 19  TIBC 302  IRON 16*   Urine Drug Screen: Drugs of Abuse     Component Value Date/Time   LABOPIA POSITIVE* 10/21/2006 1513   COCAINSCRNUR NONE DETECTED 10/21/2006 1513   LABBENZ NONE DETECTED 10/21/2006 1513   AMPHETMU NONE DETECTED 10/21/2006 1513   THCU NONE DETECTED 10/21/2006 1513   LABBARB  Value: NONE DETECTED        DRUG SCREEN FOR MEDICAL PURPOSES ONLY.  IF CONFIRMATION IS NEEDED FOR ANY PURPOSE, NOTIFY LAB WITHIN 5 DAYS. 10/21/2006 1513    Urinalysis: No results found for this basename: COLORURINE, APPERANCEUR, LABSPEC, PHURINE, GLUCOSEU, HGBUR, BILIRUBINUR, KETONESUR, PROTEINUR, UROBILINOGEN, NITRITE, LEUKOCYTESUR,  in the last 168 hours  Micro Results: Recent Results (from the past 240 hour(s))  URINE CULTURE     Status: None   Collection Time    05/25/12  6:09 PM      Result Value Range Status   Specimen Description URINE, CLEAN CATCH   Final   Special Requests Normal   Final  Culture  Setup Time 05/25/2012 19:33   Final   Colony Count NO GROWTH   Final   Culture NO GROWTH   Final   Report Status 05/27/2012 FINAL   Final  MRSA PCR SCREENING     Status: None   Collection Time    05/25/12  6:15 PM      Result Value Range Status   MRSA by PCR NEGATIVE  NEGATIVE Final   Comment:            The GeneXpert MRSA Assay (FDA     approved for NASAL specimens     only), is one component of a     comprehensive MRSA colonization     surveillance program. It is not     intended to diagnose MRSA     infection nor to guide or     monitor treatment for     MRSA infections.  CULTURE, BLOOD (ROUTINE X 2)     Status: None   Collection Time    05/25/12  7:50 PM      Result Value Range Status   Specimen Description BLOOD LEFT HAND   Final   Special Requests BOTTLES DRAWN AEROBIC ONLY 5CC   Final   Culture  Setup Time 05/26/2012 03:18   Final   Culture NO GROWTH 5 DAYS    Final   Report Status 06/02/2012 FINAL   Final  CULTURE, BLOOD (ROUTINE X 2)     Status: None   Collection Time    05/25/12  7:57 PM      Result Value Range Status   Specimen Description BLOOD RIGHT HAND   Final   Special Requests BOTTLES DRAWN AEROBIC ONLY 3CC   Final   Culture  Setup Time 05/26/2012 03:18   Final   Culture NO GROWTH 5 DAYS   Final   Report Status 06/02/2012 FINAL   Final  BODY FLUID CULTURE     Status: None   Collection Time    05/26/12  5:50 PM      Result Value Range Status   Specimen Description PLEURAL FLUID   Final   Special Requests NONE   Final   Gram Stain     Final   Value: RARE WBC PRESENT, PREDOMINANTLY MONONUCLEAR     NO ORGANISMS SEEN   Culture NO GROWTH 3 DAYS   Final   Report Status 05/30/2012 FINAL   Final  CLOSTRIDIUM DIFFICILE BY PCR     Status: Abnormal   Collection Time    05/30/12  2:44 PM      Result Value Range Status   C difficile by pcr POSITIVE (*) NEGATIVE Final   Comment: CRITICAL RESULT CALLED TO, READ BACK BY AND VERIFIED WITH:     HALLETTM RN 16:40 05/30/12 ([email or web address])   Studies/Results: Dg Chest Port 1 View  06/02/2012  *RADIOLOGY REPORT*  Clinical Data: Right chest tube  PORTABLE CHEST - 1 VIEW  Comparison: Yesterday  Findings: Left subclavian vein Port-A-Cath, right subclavian dual lead pacemaker and leads, right internal jugular central venous catheter, and right chest tube are stable.  There is no pneumothorax.  Volume loss in the right lung with central and basilar dense opacity are stable.    Diffuse edema has improved.  IMPRESSION: Improved edema.  No pneumothorax.  Stable chest tube.   Original Report Authenticated By: Jolaine Click, M.D.    Dg Chest Port 1 View  06/01/2012  *RADIOLOGY REPORT*  Clinical Data: Right chest tube  PORTABLE  CHEST - 1 VIEW  Comparison: 05/31/2012  Findings: Cardiomediastinal silhouette is stable.  Dual lead cardiac pacemaker is unchanged in position.  Stable left IJ Port-A- Cath position.  Stable  right pigtail catheter position.  Stable right IJ central line position. Again noted mild interstitial edema without change in aeration. Stable basilar atelectasis or infiltrate.  IMPRESSION: Stable right pigtail catheter position.  Persistent mild congestion/edema.  No diagnostic pneumothorax.   Original Report Authenticated By: Natasha Mead, M.D.    Medications: I have reviewed the patient's current medications. Scheduled Meds: . ipratropium  0.5 mg Nebulization Q6H   And  . albuterol  2.5 mg Nebulization Q6H  . amLODipine  10 mg Oral Daily  . antiseptic oral rinse  15 mL Mouth Rinse BID  . atorvastatin  10 mg Oral q1800  . budesonide  0.5 mg Nebulization BID  . calcium-vitamin D  1 tablet Oral Daily  . carvedilol  12.5 mg Per Tube Q12H  . chlorhexidine  15 mL Mouth Rinse BID  . citalopram  10 mg Oral Daily  . enoxaparin (LOVENOX) injection  120 mg Subcutaneous Q12H  . furosemide  80 mg Oral Once  . insulin aspart  0-20 Units Subcutaneous TID WC  . metroNIDAZOLE  500 mg Oral Q8H  . multivitamin  5 mL Per Tube Daily  . olopatadine  1 drop Both Eyes BID  . pantoprazole  40 mg Oral Daily  . potassium chloride  40 mEq Oral BID  . sodium chloride  10-40 mL Intracatheter Q12H   Continuous Infusions:   PRN Meds:.acetaminophen (TYLENOL) oral liquid 160 mg/5 mL, albuterol, chlorpheniramine-HYDROcodone, ibuprofen, morphine injection, ondansetron (ZOFRAN) IV, sodium chloride   Assessment/Plan: Ms. Crew is a 67 year old woman with a history of breast cancer x 2, carcinoid of the right lower lobe, diastolic heart failure, chronic obstructive pulmonary disease, diabetes, and complete heart block requiring dual-chamber pacemaker who presents with a several week history of increasing shortness of breath and fatigue, and is beign treated for acute respiratory failure, a R pleural effusion, RLE DVT, and C. diff infection.   1. Bilateral pleural effusion: Pleural effusions first noted 12/2011 and were  very small. Slow progressive increase in effusions since that time, R>L. S/p right chest tube, placed in the ICU on 2/7. Unfortunately the fluid was not sent off for labs. Cytology recommends collecting cytology from 'fresh' chest tube sample. Spoke with IR and will send off labs on pleural fluid from pleura vac contraption, as I don't think there is enough fluid draining from the chest tube. Over the past 24hrs 50cc from the CT, so possible removal today, pending PCCM. The big question is what is the cause of this effusion. Possible it is a malignant effusion related to her breast cancer; unfortunately cytology was cancelled. Peripheral smear shows no malignant cells. She will likely need VATs for biopsy.   2. ?HCAP: She was started on Vanc and Zosyn on admission to the ICU, which was changed to Ceftriaxone. She is on day 6 of 7. While we are unsure if she actually had HCAP, completed 7 day course of Rocephin yesterday.  3. Diastolic CHF: Volume overload on admission. BLE edema present on admission and pulm vascular congestion on CXR. ECHO performed on 05/25/12: EF 60 to 65%, grade 1 diastolic dysfx, PAS 104 mmHg. Diuresed with 40mg  IV Lasix. Total of almoat -8L. Cr has been stable; todays labs pending. Possible diuresis if Cr remains stable.  4. RLE DVT: Seen on BLE duplex on 2/7.  She was started on a heparin drip, which was transitioned to Lovenox, which has shown improved long-term survival in cancer pts requiring long-term anticoagulation. - Lovenox per Pharmacy  5. C. diff infection: Pt been on abx for presumed HCAP. Still on Rocephin (last day). Started on Flagyl for C.diff treatment 2/11.  - Cont Flagyl   6. R invasive ductal carcinoma: Being treated by Dr. [name]. The Herceptin was discontinued due to concerns for possible CHF. She is currently on Fulvestrant, but is actually missing a dose while in the hospital. Dr. [name] would like the pt to schedule a f/u appt once she is out of the  hospital. He states missing the dose of the Fulvestrant for a week will not negatively affect the patient.  7. AKI: Resolved Cr 0.86 on 2/13. Will monitor, especially since we have been diuresing with Lasix. BMP pending.   8. Hypokalemia: Hyperkalemia on admission. Hypokalemia over teh past few days. Potassium replaced. BMP pending; will replace as needed.   9. Anemia: Stable. Hbg 9.1   10. Acute encephalopathy: Resolved. Thought to be 2/2 hypoxia and hypercapnea on HD #2.  11. DM: On Metformin and Januvia at home. These were held on admission. She is on resistant SSI. CBGs have been slightly elevated this admission, but improved over the past 24hrs..   12. HTN: BP slightly elevated but stable.  - Continue amlodipine and carvedilol  13. CAD: On Crestor at home.  Started Atorvastin this admission.  14. Dispo: D/c pending further clinical improvement.   The patient does have a current PCP [name], MD), therefore will be requiring OPC follow-up after discharge.   The patient does not have transportation limitations that hinder transportation to clinic appointments.  .Services Needed at time of discharge: Y = Yes, Blank = No PT:   OT:   RN:   Equipment:   Other:     LOS: 9 days   Genelle Gather 06/02/2012, 11:41 AM

## 2012-06-02 NOTE — Progress Notes (Signed)
Medical Student Daily Progress Note  Subjective: Melissa Villa feels like her breathing is ok.  Her overall mood seems improved from yesterday, although she still c/o generalized pain and reports a little frustration, feeling as though she "was placed in the new room and forgotten."  Objective: Vital signs in last 24 hours: Filed Vitals:   06/01/12 1552 06/01/12 2135 06/01/12 2249 06/02/12 0320  BP:   159/63   Pulse:   91   Temp:   97.9 F (36.6 C)   TempSrc:   Axillary   Resp:   18   Height:      Weight:      SpO2: 96% 97% 93% 94%   Weight change:   Intake/Output Summary (Last 24 hours) at 06/02/12 0855 Last data filed at 06/02/12 0704  Gross per 24 hour  Intake    120 ml  Output    500 ml  Net   -380 ml   Physical Exam: BP 159/63  Pulse 91  Temp(Src) 97.9 F (36.6 C) (Axillary)  Resp 18  Ht 5' 4.5" (1.638 m)  Wt 125.2 kg (276 lb 0.3 oz)  BMI 46.66 kg/m2  SpO2 96% General appearance: alert, cooperative, no distress and sitting comfortably in bedside chair Lungs: CTAB, excepting diminished BS in RLL Heart: regular rate and rhythm, S1, S2 normal, no murmur, click, rub or gallop Extremities: 1+ BL pitting edema  Lab Results: Results for orders placed during the hospital encounter of 05/24/12 (from the past 24 hour(s))  GLUCOSE, CAPILLARY     Status: Abnormal   Collection Time    06/01/12 12:14 PM      Result Value Range   Glucose-Capillary 167 (*) 70 - 99 mg/dL   Comment 1 Notify RN    GLUCOSE, CAPILLARY     Status: Abnormal   Collection Time    06/01/12  4:59 PM      Result Value Range   Glucose-Capillary 206 (*) 70 - 99 mg/dL   Comment 1 Notify RN    GLUCOSE, CAPILLARY     Status: Abnormal   Collection Time    06/01/12 10:49 PM      Result Value Range   Glucose-Capillary 130 (*) 70 - 99 mg/dL   Comment 1 Notify RN    CBC     Status: Abnormal   Collection Time    06/02/12  5:00 AM      Result Value Range   WBC 7.0  4.0 - 10.5 K/uL   RBC 4.52  3.87 -  5.11 MIL/uL   Hemoglobin 9.1 (*) 12.0 - 15.0 g/dL   HCT 28.4 (*) 13.2 - 44.0 %   MCV 72.3 (*) 78.0 - 100.0 fL   MCH 20.1 (*) 26.0 - 34.0 pg   MCHC 27.8 (*) 30.0 - 36.0 g/dL   RDW 10.2 (*) 72.5 - 36.6 %   Platelets 208  150 - 400 K/uL  GLUCOSE, CAPILLARY     Status: Abnormal   Collection Time    06/02/12  8:33 AM      Result Value Range   Glucose-Capillary 169 (*) 70 - 99 mg/dL   Micro Results: Recent Results (from the past 240 hour(s))  URINE CULTURE     Status: None   Collection Time    05/25/12  6:09 PM      Result Value Range Status   Specimen Description URINE, CLEAN CATCH   Final   Special Requests Normal   Final   Culture  Setup  Time 05/25/2012 19:33   Final   Colony Count NO GROWTH   Final   Culture NO GROWTH   Final   Report Status 05/27/2012 FINAL   Final  MRSA PCR SCREENING     Status: None   Collection Time    05/25/12  6:15 PM      Result Value Range Status   MRSA by PCR NEGATIVE  NEGATIVE Final   Comment:            The GeneXpert MRSA Assay (FDA     approved for NASAL specimens     only), is one component of a     comprehensive MRSA colonization     surveillance program. It is not     intended to diagnose MRSA     infection nor to guide or     monitor treatment for     MRSA infections.  CULTURE, BLOOD (ROUTINE X 2)     Status: None   Collection Time    05/25/12  7:50 PM      Result Value Range Status   Specimen Description BLOOD LEFT HAND   Final   Special Requests BOTTLES DRAWN AEROBIC ONLY 5CC   Final   Culture  Setup Time 05/26/2012 03:18   Final   Culture     Final   Value:        BLOOD CULTURE RECEIVED NO GROWTH TO DATE CULTURE WILL BE HELD FOR 5 DAYS BEFORE ISSUING A FINAL NEGATIVE REPORT   Report Status PENDING   Incomplete  CULTURE, BLOOD (ROUTINE X 2)     Status: None   Collection Time    05/25/12  7:57 PM      Result Value Range Status   Specimen Description BLOOD RIGHT HAND   Final   Special Requests BOTTLES DRAWN AEROBIC ONLY 3CC    Final   Culture  Setup Time 05/26/2012 03:18   Final   Culture     Final   Value:        BLOOD CULTURE RECEIVED NO GROWTH TO DATE CULTURE WILL BE HELD FOR 5 DAYS BEFORE ISSUING A FINAL NEGATIVE REPORT   Report Status PENDING   Incomplete  BODY FLUID CULTURE     Status: None   Collection Time    05/26/12  5:50 PM      Result Value Range Status   Specimen Description PLEURAL FLUID   Final   Special Requests NONE   Final   Gram Stain     Final   Value: RARE WBC PRESENT, PREDOMINANTLY MONONUCLEAR     NO ORGANISMS SEEN   Culture NO GROWTH 3 DAYS   Final   Report Status 05/30/2012 FINAL   Final  CLOSTRIDIUM DIFFICILE BY PCR     Status: Abnormal   Collection Time    05/30/12  2:44 PM      Result Value Range Status   C difficile by pcr POSITIVE (*) NEGATIVE Final   Comment: CRITICAL RESULT CALLED TO, READ BACK BY AND VERIFIED WITH:     HALLETTM RN 16:40 05/30/12 ([email or web address])   Studies/Results: Dg Chest Port 1 View  06/02/2012  *RADIOLOGY REPORT*  Clinical Data: Right chest tube  PORTABLE CHEST - 1 VIEW  Comparison: Yesterday  Findings: Left subclavian vein Port-A-Cath, right subclavian dual lead pacemaker and leads, right internal jugular central venous catheter, and right chest tube are stable.  There is no pneumothorax.  Volume loss in the right lung with central and basilar dense  opacity are stable.    Diffuse edema has improved.  IMPRESSION: Improved edema.  No pneumothorax.  Stable chest tube.   Original Report Authenticated By: Jolaine Click, M.D.    Dg Chest Port 1 View  06/01/2012  *RADIOLOGY REPORT*  Clinical Data: Right chest tube  PORTABLE CHEST - 1 VIEW  Comparison: 05/31/2012  Findings: Cardiomediastinal silhouette is stable.  Dual lead cardiac pacemaker is unchanged in position.  Stable left IJ Port-A- Cath position.  Stable right pigtail catheter position.  Stable right IJ central line position. Again noted mild interstitial edema without change in aeration. Stable basilar atelectasis  or infiltrate.  IMPRESSION: Stable right pigtail catheter position.  Persistent mild congestion/edema.  No diagnostic pneumothorax.   Original Report Authenticated By: Natasha Mead, M.D.    Medications: I have reviewed the patient's current medications. Scheduled Meds: . ipratropium  0.5 mg Nebulization Q6H   And  . albuterol  2.5 mg Nebulization Q6H  . amLODipine  10 mg Oral Daily  . antiseptic oral rinse  15 mL Mouth Rinse BID  . atorvastatin  10 mg Oral q1800  . budesonide  0.5 mg Nebulization BID  . calcium-vitamin D  1 tablet Oral Daily  . carvedilol  12.5 mg Per Tube Q12H  . chlorhexidine  15 mL Mouth Rinse BID  . citalopram  10 mg Oral Daily  . enoxaparin (LOVENOX) injection  120 mg Subcutaneous Q12H  . famotidine  40 mg Oral BID  . furosemide  80 mg Oral Once  . insulin aspart  0-20 Units Subcutaneous TID WC  . metroNIDAZOLE  500 mg Oral Q8H  . multivitamin  5 mL Per Tube Daily  . olopatadine  1 drop Both Eyes BID  . potassium chloride  40 mEq Oral BID  . sodium chloride  10-40 mL Intracatheter Q12H   Continuous Infusions:  PRN Meds:.acetaminophen (TYLENOL) oral liquid 160 mg/5 mL, albuterol, chlorpheniramine-HYDROcodone, ibuprofen, morphine injection, ondansetron (ZOFRAN) IV, sodium chloride  Assessment/Plan: Melissa Villa is a 67yo F with PMH pertinent for invasive ductal carcinoma of R breast, CHF, ?COPD, and OSA, who is primarily being treated for acute respiratory failure, a R pleural effusion, RLE DVT, and C. diff infection.   1. BL (R>>L) pleural effusion: The R-sided chest tube has drained 0cc in the last 24 hr, so chest tube is eligible for d/c.  Still awaiting cytology and quantiferon results to more fully characterize effusion, although a bx would still be ideal for dx.  Considering re-c/s surgery for VATS.  C/o pain, which is likely due to chest tube and other recent interventions. - F/u cytology, quantiferon  - D/c chest tube per PCCM recommendations  - Morphine  2mg  q2hr prn for pain  2. ?COPD: Clinical sx improved. Will continue breathing tx, as likely receiving some sx benefit.  - Continue duonebs  - Continue budesonide   3. dCHF: Evidence of volume overload on exam (1+ pitting edema), so will likely benefit from additional diuresis.  - PO Lasix 80 daily  4. RLE DVT: Stable.  - Continue Lovenox 125mg  BID  5. C. diff infection: Stable. No episodes of diarrhea this AM.  - Cont Flagyl   6. Depressed mood: Will initiate celexa 10mg  daily.  7. ?PNA: Finished 7 days of abx.  No signs/sx of active pulmonary infection.  WBC of 7.0 this AM.  8. R invasive ductal carcinoma: NTD. Will schedule fulvestrant therapy on d/c.   9. AKI: Resolved.  - Avoid nephrotoxic drugs   10. Hypokalemia:  Stable with supplementation.  We have d/c following daily K labs. - Replete K ( BID)  11. Anemia: Stable; Hgb increased to 9.1.  12. DM: Moderate control.  - Continue SSI   13. HTN: Continues to run in 140-150/60s. - Continue amlodipine and carvedilol  14. CAD: Stable.  - Continue atorvastatin.   15. FEN/GI: Stable.  - Regular diet  - Continue ranitidine  16. Dispo: Floor status, Airborne and contact precautions.    LOS: 9 days   This is a Psychologist, occupational Note.  The care of the patient was discussed with Dr. Sherrine Maples and the assessment and plan formulated with their assistance.  Please see their attached note for official documentation of the daily encounter.  Melissa Villa Amy 06/02/2012, 8:55 AM

## 2012-06-03 LAB — GLUCOSE, CAPILLARY: Glucose-Capillary: 221 mg/dL — ABNORMAL HIGH (ref 70–99)

## 2012-06-03 MED ORDER — FUROSEMIDE 20 MG PO TABS
20.0000 mg | ORAL_TABLET | Freq: Every day | ORAL | Status: DC
  Administered 2012-06-03 – 2012-06-06 (×4): 20 mg via ORAL
  Filled 2012-06-03 (×4): qty 1

## 2012-06-03 NOTE — Progress Notes (Signed)
Subjective: Overnight, the patient notes 1 episode of vomiting after dinner, which was self-limited.  She notes that her breathing is stable compared to yesterday, and she notes no SOB as long as she does not lay flat.  Chest tube output yesterday was 50 cc.  The patient notes no further diarrhea, with only 1 formed BM yesterday.  Objective: Vital signs in last 24 hours: Filed Vitals:   06/02/12 1500 06/02/12 2006 06/02/12 2212 06/03/12 0604  BP: 153/68  140/53 158/86  Pulse: 78  87 96  Temp: 97.9 F (36.6 C)  98.5 F (36.9 C) 98.5 F (36.9 C)  TempSrc:   Oral Oral  Resp: 18  18 18   Height:      Weight:      SpO2: 94% 99% 98% 98%   Weight change:   Intake/Output Summary (Last 24 hours) at 06/03/12 0937 Last data filed at 06/03/12 0800  Gross per 24 hour  Intake    480 ml  Output     50 ml  Net    430 ml   Vitals reviewed. General: Sitting up in a chair, NAD HEENT: PERRL, EOMI, no scleral icterus Cardiac: RRR, no rubs, murmurs or gallops Pulm: Mild course breath sounds at bilateral bases, normal work of respiration, chest tube in place Abd: Obese, soft, nontender Ext: Warm and well perfused, 1+pitting edema in BLE Neuro: Alert and oriented X3, non-focal  Lab Results: Basic Metabolic Panel:  Recent Labs Lab 05/30/12 2111 05/31/12 0505 06/01/12 0515 06/02/12 1445  NA 141 142 142 142  K 3.3* 3.3* 3.5 4.0  CL 102 103 102 104  CO2 33* 32 31 30  GLUCOSE 135* 140* 147* 184*  BUN 8 8 10 6   CREATININE 0.85 0.86 0.92 0.80  CALCIUM 9.3 9.3 9.6 10.1  MG 1.8 1.7  --   --   PHOS  --  2.8  --   --    Liver Function Tests:  Recent Labs Lab 05/30/12 0443 06/01/12 0630  AST 23 26  ALT 13 15  ALKPHOS 44 50  BILITOT 0.2* 0.2*  PROT 6.9 7.5  ALBUMIN 2.5* 2.8*   CBC:  Recent Labs Lab 06/01/12 0515 06/02/12 0500  WBC 6.2 7.0  HGB 8.4* 9.1*  HCT 30.3* 32.7*  MCV 73.2* 72.3*  PLT 176 208   Cardiac Enzymes:  Recent Labs Lab 05/27/12 2121 05/28/12 0301  05/28/12 0857  TROPONINI <0.30 <0.30 <0.30   ProBNP:  03/22/12: 193.6  04/02/12: 441  05/24/12:  442  CBG:  Recent Labs Lab 06/01/12 1659 06/01/12 2249 06/02/12 0833 06/02/12 1229 06/02/12 1755 06/02/12 2211  GLUCAP 206* 130* 169* 176* 201* 136*   Anemia Panel:  Recent Labs Lab 05/28/12 0800  FERRITIN 19  TIBC 302  IRON 16*   Urine Drug Screen: Drugs of Abuse     Component Value Date/Time   LABOPIA POSITIVE* 10/21/2006 1513   COCAINSCRNUR NONE DETECTED 10/21/2006 1513   LABBENZ NONE DETECTED 10/21/2006 1513   AMPHETMU NONE DETECTED 10/21/2006 1513   THCU NONE DETECTED 10/21/2006 1513   LABBARB  Value: NONE DETECTED        DRUG SCREEN FOR MEDICAL PURPOSES ONLY.  IF CONFIRMATION IS NEEDED FOR ANY PURPOSE, NOTIFY LAB WITHIN 5 DAYS. 10/21/2006 1513    Urinalysis: No results found for this basename: COLORURINE, APPERANCEUR, LABSPEC, PHURINE, GLUCOSEU, HGBUR, BILIRUBINUR, KETONESUR, PROTEINUR, UROBILINOGEN, NITRITE, LEUKOCYTESUR,  in the last 168 hours  Micro Results: Recent Results (from the past 240 hour(s))  URINE  CULTURE     Status: None   Collection Time    05/25/12  6:09 PM      Result Value Range Status   Specimen Description URINE, CLEAN CATCH   Final   Special Requests Normal   Final   Culture  Setup Time 05/25/2012 19:33   Final   Colony Count NO GROWTH   Final   Culture NO GROWTH   Final   Report Status 05/27/2012 FINAL   Final  MRSA PCR SCREENING     Status: None   Collection Time    05/25/12  6:15 PM      Result Value Range Status   MRSA by PCR NEGATIVE  NEGATIVE Final   Comment:            The GeneXpert MRSA Assay (FDA     approved for NASAL specimens     only), is one component of a     comprehensive MRSA colonization     surveillance program. It is not     intended to diagnose MRSA     infection nor to guide or     monitor treatment for     MRSA infections.  CULTURE, BLOOD (ROUTINE X 2)     Status: None   Collection Time    05/25/12  7:50 PM       Result Value Range Status   Specimen Description BLOOD LEFT HAND   Final   Special Requests BOTTLES DRAWN AEROBIC ONLY 5CC   Final   Culture  Setup Time 05/26/2012 03:18   Final   Culture NO GROWTH 5 DAYS   Final   Report Status 06/02/2012 FINAL   Final  CULTURE, BLOOD (ROUTINE X 2)     Status: None   Collection Time    05/25/12  7:57 PM      Result Value Range Status   Specimen Description BLOOD RIGHT HAND   Final   Special Requests BOTTLES DRAWN AEROBIC ONLY 3CC   Final   Culture  Setup Time 05/26/2012 03:18   Final   Culture NO GROWTH 5 DAYS   Final   Report Status 06/02/2012 FINAL   Final  BODY FLUID CULTURE     Status: None   Collection Time    05/26/12  5:50 PM      Result Value Range Status   Specimen Description PLEURAL FLUID   Final   Special Requests NONE   Final   Gram Stain     Final   Value: RARE WBC PRESENT, PREDOMINANTLY MONONUCLEAR     NO ORGANISMS SEEN   Culture NO GROWTH 3 DAYS   Final   Report Status 05/30/2012 FINAL   Final  CLOSTRIDIUM DIFFICILE BY PCR     Status: Abnormal   Collection Time    05/30/12  2:44 PM      Result Value Range Status   C difficile by pcr POSITIVE (*) NEGATIVE Final   Comment: CRITICAL RESULT CALLED TO, READ BACK BY AND VERIFIED WITH:     HALLETTM RN 16:40 05/30/12 ([email or web address])   Studies/Results: Dg Chest Port 1 View  06/02/2012  *RADIOLOGY REPORT*  Clinical Data: Right chest tube  PORTABLE CHEST - 1 VIEW  Comparison: Yesterday  Findings: Left subclavian vein Port-A-Cath, right subclavian dual lead pacemaker and leads, right internal jugular central venous catheter, and right chest tube are stable.  There is no pneumothorax.  Volume loss in the right lung with central and basilar dense opacity are stable.  Diffuse edema has improved.  IMPRESSION: Improved edema.  No pneumothorax.  Stable chest tube.   Original Report Authenticated By: Jolaine Click, M.D.    Dg Chest Port 1 View  06/01/2012  *RADIOLOGY REPORT*  Clinical Data: Right  chest tube  PORTABLE CHEST - 1 VIEW  Comparison: 05/31/2012  Findings: Cardiomediastinal silhouette is stable.  Dual lead cardiac pacemaker is unchanged in position.  Stable left IJ Port-A- Cath position.  Stable right pigtail catheter position.  Stable right IJ central line position. Again noted mild interstitial edema without change in aeration. Stable basilar atelectasis or infiltrate.  IMPRESSION: Stable right pigtail catheter position.  Persistent mild congestion/edema.  No diagnostic pneumothorax.   Original Report Authenticated By: Natasha Mead, M.D.    Medications: I have reviewed the patient's current medications. Scheduled Meds: . albuterol  2.5 mg Nebulization BID  . amLODipine  10 mg Oral Daily  . antiseptic oral rinse  15 mL Mouth Rinse BID  . atorvastatin  10 mg Oral q1800  . budesonide  0.5 mg Nebulization BID  . calcium-vitamin D  1 tablet Oral Daily  . carvedilol  12.5 mg Per Tube Q12H  . chlorhexidine  15 mL Mouth Rinse BID  . citalopram  10 mg Oral Daily  . enoxaparin (LOVENOX) injection  120 mg Subcutaneous Q12H  . furosemide  80 mg Oral Once  . insulin aspart  0-20 Units Subcutaneous TID WC  . ipratropium  0.5 mg Nebulization BID  . metroNIDAZOLE  500 mg Oral Q8H  . multivitamin  5 mL Per Tube Daily  . olopatadine  1 drop Both Eyes BID  . pantoprazole  40 mg Oral Daily  . potassium chloride  40 mEq Oral BID  . sodium chloride  10-40 mL Intracatheter Q12H   Continuous Infusions:   PRN Meds:.acetaminophen (TYLENOL) oral liquid 160 mg/5 mL, albuterol, chlorpheniramine-HYDROcodone, ibuprofen, morphine injection, ondansetron (ZOFRAN) IV, oxyCODONE, sodium chloride   Assessment/Plan: Ms. Nam is a 67 year old woman with a history of breast cancer x 2, carcinoid of the right lower lobe, diastolic heart failure, chronic obstructive pulmonary disease, diabetes, and complete heart block requiring dual-chamber pacemaker who presents with a several week history of increasing  shortness of breath and fatigue, and is beign treated for acute respiratory failure, a R pleural effusion, RLE DVT, and C. diff infection.   1. Bilateral pleural effusion: Pleural effusions first noted 12/2011 and were very small. Slow progressive increase in effusions since that time, R>L. S/p right chest tube, placed in the ICU on 2/7. Pathology recommends collecting cytology from 'fresh' chest tube sample. Spoke with IR yesterday and will send off labs on pleural fluid from pleura vac contraption, as I don't think there is enough fluid draining from the chest tube. Over the past 24hrs 50cc from the CT, which is improved. The big question is what is the cause of this effusion. Possible it is a malignant effusion related to her breast cancer, though it will be difficult to tell without cytology. Peripheral smear shows no malignant cells.  Differential also includes tuberculous effusion. -Quantiferon gold pending; will d/c respiratory isolation if negative -will continue to try to obtain cytology, though with slowed chest tube drainage this may be difficult -Chest tube output has been variable, but was low yesterday.  If it remains low, can hopefully discontinue chest tube possibly as early as tomorrow if approved by IR  2. ?HCAP: Resolved. She was started on Vanc and Zosyn on admission to the ICU, which was changed  to Ceftriaxone. She is on day 6 of 7. While we are unsure if she actually had HCAP, completed 7 day course of Rocephin 2/13.  3. Diastolic CHF: Volume overload on admission. BLE edema present on admission and pulm vascular congestion on CXR. ECHO performed on 05/25/12: EF 60 to 65%, grade 1 diastolic dysfx, PAS 104 mmHg. Diuresed with 40mg  IV Lasix. Total of almoat -8L. Cr has been stable.  Patient was not on lasix prior to admission -lasix 20 mg PO daily to remain fluid-neutral -continue to check I/O's, daily weights, daily creatinine  4. RLE DVT: Seen on BLE duplex on 2/7. She was started on  a heparin drip, which was transitioned to Lovenox, which has shown improved long-term survival in cancer pts requiring long-term anticoagulation. - Lovenox per Pharmacy  5. C. diff infection: Possibly caused by antibiotics for HCAP.   Started on Flagyl for C.diff treatment 2/11.  Plan to continue for 10-14 days. - Cont Flagyl   6. R invasive ductal carcinoma: Being treated by Dr. [name]. The Herceptin was discontinued due to concerns for possible CHF. She is currently on Fulvestrant, but is missing a dose while in the hospital. Dr. [name] would like the pt to schedule a f/u appt once she is out of the hospital. He states missing the dose of the Fulvestrant for a week will not negatively affect the patient.  7. AKI: Resolved.   8. Hypokalemia: replace as needed.   9. Anemia: Stable. Hbg 9.1   10. Acute encephalopathy: Resolved. Thought to be 2/2 hypoxia and hypercapnea on HD #2.  11. DM: On Metformin and Januvia at home. These were held on admission. She is on resistant SSI. CBGs have been slightly elevated this admission, but improved over the past 24hrs..   12. HTN: BP slightly elevated but stable.  - Continue amlodipine and carvedilol  13. CAD: On Crestor at home.  Started Atorvastin this admission.  14. Dispo: D/c pending further clinical improvement, likely 2-4 days  The patient does have a current PCP [name], MD), therefore will be requiring OPC follow-up after discharge.   The patient does not have transportation limitations that hinder transportation to clinic appointments.  .Services Needed at time of discharge: Y = Yes, Blank = No PT:   OT:   RN:   Equipment:   Other:     LOS: 10 days   Janalyn Harder 06/03/2012, 9:37 AM

## 2012-06-03 NOTE — Progress Notes (Signed)
Subjective: Pt feels ok, Sitting up in chair. Denies SOB as long as sitting up. Denies much pain from chest tube.  Objective: Physical Exam: BP 158/86  Pulse 96  Temp(Src) 98.5 F (36.9 C) (Oral)  Resp 18  Ht 5' 4.5" (1.638 m)  Wt 276 lb 0.3 oz (125.2 kg)  BMI 46.66 kg/m2  SpO2 98% Rt chest tube intact, site clean. Output serous, and minimal past 24hrs.   Labs: CBC  Recent Labs  06/01/12 0515 06/02/12 0500  WBC 6.2 7.0  HGB 8.4* 9.1*  HCT 30.3* 32.7*  PLT 176 208   BMET  Recent Labs  06/01/12 0515 06/02/12 1445  NA 142 142  K 3.5 4.0  CL 102 104  CO2 31 30  GLUCOSE 147* 184*  BUN 10 6  CREATININE 0.92 0.80  CALCIUM 9.6 10.1   LFT  Recent Labs  06/01/12 0630  PROT 7.5  ALBUMIN 2.8*  AST 26  ALT 15  ALKPHOS 50  BILITOT 0.2*  BILIDIR <0.1  IBILI NOT CALCULATED   PT/INR  Recent Labs  06/01/12 0515  LABPROT 13.5  INR 1.04     Studies/Results: Dg Chest Port 1 View  06/02/2012  *RADIOLOGY REPORT*  Clinical Data: Right chest tube  PORTABLE CHEST - 1 VIEW  Comparison: Yesterday  Findings: Left subclavian vein Port-A-Cath, right subclavian dual lead pacemaker and leads, right internal jugular central venous catheter, and right chest tube are stable.  There is no pneumothorax.  Volume loss in the right lung with central and basilar dense opacity are stable.    Diffuse edema has improved.  IMPRESSION: Improved edema.  No pneumothorax.  Stable chest tube.   Original Report Authenticated By: Jolaine Click, M.D.    Dg Chest Port 1 View  06/01/2012  *RADIOLOGY REPORT*  Clinical Data: Right chest tube  PORTABLE CHEST - 1 VIEW  Comparison: 05/31/2012  Findings: Cardiomediastinal silhouette is stable.  Dual lead cardiac pacemaker is unchanged in position.  Stable left IJ Port-A- Cath position.  Stable right pigtail catheter position.  Stable right IJ central line position. Again noted mild interstitial edema without change in aeration. Stable basilar atelectasis  or infiltrate.  IMPRESSION: Stable right pigtail catheter position.  Persistent mild congestion/edema.  No diagnostic pneumothorax.   Original Report Authenticated By: Natasha Mead, M.D.     Assessment/Plan: Rt chest tube for loc effusion placed 2/7 Consider follow up CT if output remains minimal to reassess effusion. Other plans per primary and PCCM.    LOS: 10 days    Brayton El PA-C 06/03/2012 8:48 AM

## 2012-06-03 NOTE — Progress Notes (Signed)
ANTICOAGULATION CONSULT NOTE - Follow Up Consult  Pharmacy Consult for Heparin Indication: Right Leg DVT  No Known Allergies  Patient Measurements: Height: 5' 4.5" (163.8 cm) Weight: 276 lb 0.3 oz (125.2 kg) IBW/kg (Calculated) : 55.85 Heparin Dosing Weight: 88 kg  Vital Signs: Temp: 98.5 F (36.9 C) (02/15 0604) Temp src: Oral (02/15 0604) BP: 158/86 mmHg (02/15 0604) Pulse Rate: 96 (02/15 0604)  Labs:  Recent Labs  06/01/12 0515 06/02/12 0500 06/02/12 1445  HGB 8.4* 9.1*  --   HCT 30.3* 32.7*  --   PLT 176 208  --   APTT 80*  --   --   LABPROT 13.5  --   --   INR 1.04  --   --   CREATININE 0.92  --  0.80    Estimated Creatinine Clearance: 93.8 ml/min (by C-G formula based on Cr of 0.8).  Assessment: 70 YOF with right leg DVT on Lovenox. Hgb is low but stable. Platelets are stable. No bleeding per RN.  Goal of Therapy:  Heparin level 0.3-0.7 units/ml Monitor platelets by anticoagulation protocol: Yes   Plan:  Continue Lovenox 120mg  SQ q12h Monitor CBC at least q72h Will follow for long-term anticoagulation plans.  Estella Husk, Pharm.D., BCPS Clinical Pharmacist Phone: [phone] or [phone] Pager: [phone] 06/03/2012, 1:29 PM

## 2012-06-03 NOTE — Care Management (Signed)
Pt placed self on cpap for the night. tol well 

## 2012-06-04 LAB — CBC
MCH: 20 pg — ABNORMAL LOW (ref 26.0–34.0)
MCV: 72.3 fL — ABNORMAL LOW (ref 78.0–100.0)
Platelets: 289 10*3/uL (ref 150–400)
RDW: 24.6 % — ABNORMAL HIGH (ref 11.5–15.5)

## 2012-06-04 LAB — BASIC METABOLIC PANEL
CO2: 28 mEq/L (ref 19–32)
Calcium: 9.5 mg/dL (ref 8.4–10.5)
Creatinine, Ser: 1.04 mg/dL (ref 0.50–1.10)
GFR calc non Af Amer: 56 mL/min — ABNORMAL LOW (ref >90)
Glucose, Bld: 172 mg/dL — ABNORMAL HIGH (ref 70–99)
Sodium: 134 mEq/L — ABNORMAL LOW (ref 135–145)

## 2012-06-04 LAB — GLUCOSE, CAPILLARY: Glucose-Capillary: 190 mg/dL — ABNORMAL HIGH (ref 70–99)

## 2012-06-04 MED ORDER — CLINDAMYCIN PHOSPHATE 600 MG/50ML IV SOLN: 600.0000 mg | Freq: Three times a day (TID) | INTRAVENOUS | Status: DC

## 2012-06-04 MED ORDER — IRBESARTAN 150 MG PO TABS
150.0000 mg | ORAL_TABLET | Freq: Every day | ORAL | Status: DC
  Administered 2012-06-04: 150 mg via ORAL
  Filled 2012-06-04 (×2): qty 1

## 2012-06-04 NOTE — Progress Notes (Signed)
Subjective: Pt feels ok. Denies SOB as long as sitting up. Denies much pain from chest tube.  Objective: Physical Exam: BP 175/72  Pulse 88  Temp(Src) 98 F (36.7 C) (Oral)  Resp 20  Ht 5' 4.5" (1.638 m)  Wt 276 lb 0.3 oz (125.2 kg)  BMI 46.66 kg/m2  SpO2 98% Rt chest tube intact, site clean. Output noted to be 60cc last 24 hrs, though 0 the day before  Pleurevac:   2nd column level at ~1240           3rd column level at ~ 1660   Labs: CBC  Recent Labs  06/02/12 0500 06/04/12 0500  WBC 7.0 7.5  HGB 9.1* 8.6*  HCT 32.7* 31.0*  PLT 208 289   BMET  Recent Labs  06/02/12 1445 06/04/12 0500  NA 142 134*  K 4.0 5.2*  CL 104 100  CO2 30 28  GLUCOSE 184* 172*  BUN 6 8  CREATININE 0.80 1.04  CALCIUM 10.1 9.5   LFT No results found for this basename: PROT, ALBUMIN, AST, ALT, ALKPHOS, BILITOT, BILIDIR, IBILI, LIPASE,  in the last 72 hours PT/INR No results found for this basename: LABPROT, INR,  in the last 72 hours   Studies/Results: No results found.  Assessment/Plan: Rt chest tube for loc effusion placed 2/7 Discrepancies in actual output. Would check CXR in am tomorrow, and will look at where fluid levels are in each column. If unchanged, would be comfortable removing chest tube.    LOS: 11 days    Brayton El PA-C 06/04/2012 8:38 AM

## 2012-06-04 NOTE — Progress Notes (Signed)
Placed patient on CPAP at 11cm with oxygen set at 2lpm

## 2012-06-04 NOTE — Progress Notes (Signed)
Medical Student Daily Progress Note  Subjective: Melissa Villa reports improved sx of SOB and ability to lie flat on bed, which is supported by her being asleep in her bed when I walked in. Slept well through the night, and appetite is ok.    She does report that she strained muscles around her right shoulder when she was trying to readjust her position in bed last night, causing exquisite pain at the time.  The pain has since been improving gradually.  Pt also reports pain on dorsal feet that worsens when she is sitting up or standing.  Objective: Vital signs in last 24 hours: Filed Vitals:   06/03/12 2132 06/04/12 0702 06/04/12 0804 06/04/12 0805  BP: 134/75 175/72    Pulse: 78 81  88  Temp: 98 F (36.7 C) 98 F (36.7 C)    TempSrc: Rectal Oral    Resp:  20    Height:      Weight:      SpO2: 97% 95% 98% 98%   Weight change:   Intake/Output Summary (Last 24 hours) at 06/04/12 0828 Last data filed at 06/03/12 1900  Gross per 24 hour  Intake   1200 ml  Output     10 ml  Net   1190 ml   Physical Exam: BP 175/72  Pulse 88  Temp(Src) 98 F (36.7 C) (Oral)  Resp 20  Ht 5' 4.5" (1.638 m)  Wt 125.2 kg (276 lb 0.3 oz)  BMI 46.66 kg/m2  SpO2 98% General appearance: alert, cooperative, no distress and lying in bed with CPAP machine Lungs: NWOB.  Good air movement.  Scattered wheezes in BL apices.  Course bibasilar BS. Referred upper airway sounds noticed at apices. Heart: regular rate and rhythm, S1, S2 normal, no murmur, click, rub or gallop Abdomen: soft, non-tender; bowel sounds normal; no masses,  no organomegaly MSK: TTP of right anterior shoulder.  Pain with abduction of shoulder throughout whole ROM. Extremities: 1+ BLE edema  Lab Results: Results for orders placed during the hospital encounter of 05/24/12 (from the past 24 hour(s))  GLUCOSE, CAPILLARY     Status: Abnormal   Collection Time    06/03/12  8:38 AM      Result Value Range   Glucose-Capillary 176 (*)  70 - 99 mg/dL   Comment 1 Notify RN    GLUCOSE, CAPILLARY     Status: Abnormal   Collection Time    06/03/12 12:09 PM      Result Value Range   Glucose-Capillary 221 (*) 70 - 99 mg/dL   Comment 1 Notify RN    GLUCOSE, CAPILLARY     Status: Abnormal   Collection Time    06/03/12  5:32 PM      Result Value Range   Glucose-Capillary 230 (*) 70 - 99 mg/dL   Comment 1 Notify RN    GLUCOSE, CAPILLARY     Status: Abnormal   Collection Time    06/03/12  9:30 PM      Result Value Range   Glucose-Capillary 153 (*) 70 - 99 mg/dL  BASIC METABOLIC PANEL     Status: Abnormal   Collection Time    06/04/12  5:00 AM      Result Value Range   Sodium 134 (*) 135 - 145 mEq/L   Potassium 5.2 (*) 3.5 - 5.1 mEq/L   Chloride 100  96 - 112 mEq/L   CO2 28  19 - 32 mEq/L   Glucose, Bld 172 (*)  70 - 99 mg/dL   BUN 8  6 - 23 mg/dL   Creatinine, Ser 1.61  0.50 - 1.10 mg/dL   Calcium 9.5  8.4 - 09.6 mg/dL   GFR calc non Af Amer 56 (*) >90 mL/min   GFR calc Af Amer 64 (*) >90 mL/min  CBC     Status: Abnormal   Collection Time    06/04/12  5:00 AM      Result Value Range   WBC 7.5  4.0 - 10.5 K/uL   RBC 4.29  3.87 - 5.11 MIL/uL   Hemoglobin 8.6 (*) 12.0 - 15.0 g/dL   HCT 04.5 (*) 40.9 - 81.1 %   MCV 72.3 (*) 78.0 - 100.0 fL   MCH 20.0 (*) 26.0 - 34.0 pg   MCHC 27.7 (*) 30.0 - 36.0 g/dL   RDW 91.4 (*) 78.2 - 95.6 %   Platelets 289  150 - 400 K/uL  GLUCOSE, CAPILLARY     Status: Abnormal   Collection Time    06/04/12  8:01 AM      Result Value Range   Glucose-Capillary 162 (*) 70 - 99 mg/dL   Micro Results: Recent Results (from the past 240 hour(s))  URINE CULTURE     Status: None   Collection Time    05/25/12  6:09 PM      Result Value Range Status   Specimen Description URINE, CLEAN CATCH   Final   Special Requests Normal   Final   Culture  Setup Time 05/25/2012 19:33   Final   Colony Count NO GROWTH   Final   Culture NO GROWTH   Final   Report Status 05/27/2012 FINAL   Final  MRSA PCR  SCREENING     Status: None   Collection Time    05/25/12  6:15 PM      Result Value Range Status   MRSA by PCR NEGATIVE  NEGATIVE Final   Comment:            The GeneXpert MRSA Assay (FDA     approved for NASAL specimens     only), is one component of a     comprehensive MRSA colonization     surveillance program. It is not     intended to diagnose MRSA     infection nor to guide or     monitor treatment for     MRSA infections.  CULTURE, BLOOD (ROUTINE X 2)     Status: None   Collection Time    05/25/12  7:50 PM      Result Value Range Status   Specimen Description BLOOD LEFT HAND   Final   Special Requests BOTTLES DRAWN AEROBIC ONLY 5CC   Final   Culture  Setup Time 05/26/2012 03:18   Final   Culture NO GROWTH 5 DAYS   Final   Report Status 06/02/2012 FINAL   Final  CULTURE, BLOOD (ROUTINE X 2)     Status: None   Collection Time    05/25/12  7:57 PM      Result Value Range Status   Specimen Description BLOOD RIGHT HAND   Final   Special Requests BOTTLES DRAWN AEROBIC ONLY 3CC   Final   Culture  Setup Time 05/26/2012 03:18   Final   Culture NO GROWTH 5 DAYS   Final   Report Status 06/02/2012 FINAL   Final  BODY FLUID CULTURE     Status: None   Collection Time  05/26/12  5:50 PM      Result Value Range Status   Specimen Description PLEURAL FLUID   Final   Special Requests NONE   Final   Gram Stain     Final   Value: RARE WBC PRESENT, PREDOMINANTLY MONONUCLEAR     NO ORGANISMS SEEN   Culture NO GROWTH 3 DAYS   Final   Report Status 05/30/2012 FINAL   Final  CLOSTRIDIUM DIFFICILE BY PCR     Status: Abnormal   Collection Time    05/30/12  2:44 PM      Result Value Range Status   C difficile by pcr POSITIVE (*) NEGATIVE Final   Comment: CRITICAL RESULT CALLED TO, READ BACK BY AND VERIFIED WITH:     HALLETTM RN 16:40 05/30/12 ([email or web address])   Studies/Results: No results found. Medications: I have reviewed the patient's current medications. Scheduled Meds: . albuterol   2.5 mg Nebulization BID  . amLODipine  10 mg Oral Daily  . antiseptic oral rinse  15 mL Mouth Rinse BID  . atorvastatin  10 mg Oral q1800  . budesonide  0.5 mg Nebulization BID  . calcium-vitamin D  1 tablet Oral Daily  . carvedilol  12.5 mg Per Tube Q12H  . chlorhexidine  15 mL Mouth Rinse BID  . citalopram  10 mg Oral Daily  . enoxaparin (LOVENOX) injection  120 mg Subcutaneous Q12H  . furosemide  20 mg Oral Daily  . insulin aspart  0-20 Units Subcutaneous TID WC  . ipratropium  0.5 mg Nebulization BID  . metroNIDAZOLE  500 mg Oral Q8H  . multivitamin  5 mL Per Tube Daily  . olopatadine  1 drop Both Eyes BID  . pantoprazole  40 mg Oral Daily  . sodium chloride  10-40 mL Intracatheter Q12H   Continuous Infusions:  PRN Meds:.acetaminophen (TYLENOL) oral liquid 160 mg/5 mL, albuterol, chlorpheniramine-HYDROcodone, ibuprofen, morphine injection, ondansetron (ZOFRAN) IV, oxyCODONE, sodium chloride  Assessment/Plan: Melissa Villa is a 67yo F with PMH pertinent for invasive ductal carcinoma of R breast, CHF, ?COPD, and OSA, who is primarily being treated for acute respiratory failure, a R pleural effusion, RLE DVT, and C. diff infection.   1. BL (R>>L) pleural effusion: The R-sided chest tube has drained 60cc in the last 24 hr.  When we talked with [facility], they said they would like to leave the chest tube in until tomorrow to minimize the likelihood that they would have to put another one in.  The quantiferon has still not resulted.  From a clinical standpoint, she is stable and ready for potential discharge.  However, we need to remove the chest tube and receive the quantiferon results before discharge is a possibility.  Quantiferon in this case is more for TB risk stratification, as a bx will still be necessary to f/u malignancy v. TB.  A bx will only be pursued if there is reaccumulation of the effusion. - F/u quantiferon  - D/c chest tube per [facility] recommendations  - Morphine 2mg  q2hr prn for  pain   2. ?COPD: Stable, and clinical sx have improved. Will resume home regimen for ?COPD. - Albuterol 2.5mg  BID - Ipratropium 0.5mg  BID - Budesonide 0.5mg  BID  3. dCHF: Evidence of trace BLE edema on exam.  Will continue PO lasix to maintain euvolemia. - PO Lasix 20 daily   4. RLE DVT: Stable.  - Continue Lovenox 125mg  BID   5. C. diff infection: Stable. No episodes of diarrhea. - Cont Flagyl  6. Depressed mood: Stable. - Continue celexa 10mg  daily   7. ?PNA: Resolved. No signs/sx of active pulmonary infection. WBC of 7.5 this AM.   8. R invasive ductal carcinoma: NTD. Will schedule fulvestrant therapy on d/c.   9. AKI: Resolved.  - Avoid nephrotoxic drugs   10. Fluctuating hypo/hyperkalemia: Slightly elevated K of 5.2 this AM.   - Hold repletion today  11. Anemia: Stable; Hgb at 8.6. Slightly decreased today from yesterday, but within limits of normal lab variation and could potentially be 2/2 phlebotomy.  12. DM: Moderate control.  - Continue SSI   13. HTN: Ran from 130-170/60-80s in past 24 hr.  Would like to see better control.  She was on an ARB at home, so will add back an ARB at a reduced dose. - Continue amlodipine and carvedilol - Reinitiate irbesartan 150mg  daily  14. CAD: Stable.  - Continue atorvastatin.   15. FEN/GI: Stable.  - Regular diet  - Continue ranitidine  16. Dispo: Floor status, Airborne and contact precautions.    LOS: 11 days   This is a Psychologist, occupational Note.  The care of the patient was discussed with Dr. Manson Passey and the assessment and plan formulated with their assistance.  Please see their attached note for official documentation of the daily encounter.  Melissa Villa 06/04/2012, 8:28 AM

## 2012-06-04 NOTE — Progress Notes (Signed)
Resident Co-sign Daily Note: I have seen the patient and reviewed the excellent daily progress note by Jaye Beagle MS3 and discussed the care of the patient with them.  See below for documentation of my findings, assessment, and plans.  Subjective: The patient states she feels "okay" this morning.  She notes no SOB while sitting, and notes that her breathing is unchanged since yesterday.  She notes 1 formed BM overnight.  Chest tube output appears to have slowed significantly (though there is some discrepancy in pleuro-vac readings, see IR's note from today), giving [place] hope that her chest tube may be able to be discontinued soon.  Objective: Vital signs in last 24 hours: Filed Vitals:   06/03/12 2132 06/04/12 0702 06/04/12 0804 06/04/12 0805  BP: 134/75 175/72    Pulse: 78 81  88  Temp: 98 F (36.7 C) 98 F (36.7 C)    TempSrc: Rectal Oral    Resp:  20    Height:      Weight:      SpO2: 97% 95% 98% 98%   Physical Exam: General: Sitting up in a chair, NAD  HEENT: PERRL, EOMI, no scleral icterus  Cardiac: RRR, no rubs, murmurs or gallops  Pulm: breath sounds clear bilaterally, normal work of respiration, chest tube in place  Abd: Obese, soft, nontender  Ext: Warm and well perfused, 1+pitting edema in BLE  Neuro: Alert and oriented X3, non-focal  Lab Results: Reviewed and documented in Electronic Record Micro Results: Reviewed and documented in Electronic Record Studies/Results: Reviewed and documented in Electronic Record Medications: I have reviewed the patient's current medications. Scheduled Meds: . albuterol  2.5 mg Nebulization BID  . amLODipine  10 mg Oral Daily  . antiseptic oral rinse  15 mL Mouth Rinse BID  . atorvastatin  10 mg Oral q1800  . budesonide  0.5 mg Nebulization BID  . calcium-vitamin D  1 tablet Oral Daily  . carvedilol  12.5 mg Per Tube Q12H  . chlorhexidine  15 mL Mouth Rinse BID  . citalopram  10 mg Oral Daily  . enoxaparin (LOVENOX) injection  120  mg Subcutaneous Q12H  . furosemide  20 mg Oral Daily  . insulin aspart  0-20 Units Subcutaneous TID WC  . ipratropium  0.5 mg Nebulization BID  . irbesartan  150 mg Oral Daily  . metroNIDAZOLE  500 mg Oral Q8H  . multivitamin  5 mL Per Tube Daily  . olopatadine  1 drop Both Eyes BID  . pantoprazole  40 mg Oral Daily  . sodium chloride  10-40 mL Intracatheter Q12H   Continuous Infusions:  PRN Meds:.acetaminophen (TYLENOL) oral liquid 160 mg/5 mL, albuterol, chlorpheniramine-HYDROcodone, ibuprofen, morphine injection, ondansetron (ZOFRAN) IV, oxyCODONE, sodium chloride  Assessment/Plan: Ms. Nakajima is a 67 year old woman with a history of breast cancer x 2, carcinoid of the right lower lobe, diastolic heart failure, chronic obstructive pulmonary disease, diabetes, and complete heart block requiring dual-chamber pacemaker who presents with a several week history of increasing shortness of breath and fatigue, and is beign treated for acute respiratory failure, a R pleural effusion, RLE DVT, and C. diff infection.   1. Bilateral pleural effusion: Pleural effusions first noted 12/2011 and were very small. Slow progressive increase in effusions since that time, R>L. S/p right chest tube, placed in the ICU on 2/7. Over the past 24hrs 60cc from the CT, which is improved. The big question is what is the cause of this effusion. Possible it is a malignant effusion related  to her breast cancer, though it will be difficult to tell without cytology. Peripheral smear shows no malignant cells. Differential also includes tuberculous effusion.  -Quantiferon gold pending; will d/c respiratory isolation if negative  -will continue to try to obtain cytology, though with slowed chest tube drainage this may be difficult  -repeat CXR in AM -if CXR does not show significant fluid, and chest tube output remains low, plan to discontinue chest tube tomorrow  2. ?HCAP: Resolved. She was started on Vanc and Zosyn on  admission to the ICU, which was changed to Ceftriaxone. She is on day 6 of 7. While we are unsure if she actually had HCAP, completed 7 day course of Rocephin 2/13.   3. Diastolic CHF: Volume overload on admission. BLE edema present on admission and pulm vascular congestion on CXR. ECHO performed on 05/25/12: EF 60 to 65%, grade 1 diastolic dysfx, PAS 104 mmHg. Diuresed with 40mg  IV Lasix. Total of [place] -8L. Cr has been stable. Patient was not on lasix prior to admission  -lasix 20 mg PO daily to remain fluid-neutral  -continue to check I/O's, daily weights, daily creatinine   4. RLE DVT: Seen on BLE duplex on 2/7. She was started on a heparin drip, which was transitioned to Lovenox, which has shown improved long-term survival in cancer pts requiring long-term anticoagulation.  - Lovenox per Pharmacy   5. C. diff infection: Possibly caused by antibiotics for HCAP. Started on Flagyl for C.diff treatment 2/11. Plan to continue for 10-14 days.  - Cont Flagyl   6. R invasive ductal carcinoma: Being treated by Dr. [name]. The Herceptin was discontinued due to concerns for possible CHF. She is currently on Fulvestrant, but is missing a dose while in the hospital. Dr. [name] would like the pt to schedule a f/u appt once she is out of the hospital. He states missing the dose of the Fulvestrant for a week will not negatively affect the patient.   7. AKI: Resolved.   8. Hypokalemia: replace as needed.   9. Anemia: Stable. Hbg 9.1   10. Acute encephalopathy: Resolved. Thought to be 2/2 hypoxia and hypercapnea on HD #2.   11. DM: On Metformin and Januvia at home. These were held on admission. She is on resistant SSI. CBGs have been slightly elevated this admission, but improved over the past 24hrs.  12. HTN: BP slightly elevated but stable.  - Continue amlodipine and carvedilol - add valsartan today  13. CAD: On Crestor at home. Started Atorvastin this admission.  14. Dispo: D/c pending further  clinical improvement, likely 1-3 days   The patient does have a current PCP [name], MD), therefore will be requiring OPC follow-up after discharge.   The patient does not have transportation limitations that hinder transportation to clinic appointments.   LOS: 11 days   Janalyn Harder 06/04/2012, 10:47 AM

## 2012-06-05 ENCOUNTER — Inpatient Hospital Stay (HOSPITAL_COMMUNITY): Payer: Medicare Other

## 2012-06-05 LAB — BASIC METABOLIC PANEL
CO2: 30 mEq/L (ref 19–32)
Chloride: 101 mEq/L (ref 96–112)
Glucose, Bld: 159 mg/dL — ABNORMAL HIGH (ref 70–99)
Potassium: 4.1 mEq/L (ref 3.5–5.1)
Sodium: 138 mEq/L (ref 135–145)

## 2012-06-05 LAB — CBC
HCT: 31.3 % — ABNORMAL LOW (ref 36.0–46.0)
Hemoglobin: 8.8 g/dL — ABNORMAL LOW (ref 12.0–15.0)
MCV: 71.6 fL — ABNORMAL LOW (ref 78.0–100.0)
Platelets: 329 10*3/uL (ref 150–400)
RBC: 4.37 MIL/uL (ref 3.87–5.11)
WBC: 8.3 10*3/uL (ref 4.0–10.5)

## 2012-06-05 LAB — GLUCOSE, CAPILLARY
Glucose-Capillary: 243 mg/dL — ABNORMAL HIGH (ref 70–99)
Glucose-Capillary: 169 mg/dL — ABNORMAL HIGH (ref 70–99)
Glucose-Capillary: 169 mg/dL — ABNORMAL HIGH (ref 70–99)

## 2012-06-05 MED ORDER — PREGABALIN 75 MG PO CAPS
200.0000 mg | ORAL_CAPSULE | Freq: Two times a day (BID) | ORAL | Status: DC
  Administered 2012-06-05 – 2012-06-06 (×2): 200 mg via ORAL
  Filled 2012-06-05: qty 2
  Filled 2012-06-05: qty 1

## 2012-06-05 MED ORDER — IRBESARTAN 150 MG PO TABS
150.0000 mg | ORAL_TABLET | Freq: Two times a day (BID) | ORAL | Status: DC
  Administered 2012-06-05 – 2012-06-06 (×2): 150 mg via ORAL
  Filled 2012-06-05 (×4): qty 1

## 2012-06-05 NOTE — Progress Notes (Signed)
Resident Co-sign Daily Note: I have seen the patient and reviewed the daily progress note by Jaye Beagle, MS-III and discussed the care of the patient with her.  See my separate note for documentation of my findings, assessment, and plans.   LOS: 12 days   Genelle Gather 06/05/2012, 11:40 AM

## 2012-06-05 NOTE — Progress Notes (Signed)
Subjective: Pt feeling ok. Anxious to go home Denies SOB  Objective: Physical Exam: BP 155/77  Pulse 83  Temp(Src) 99 F (37.2 C) (Oral)  Resp 20  Ht 5' 4.5" (1.638 m)  Wt 276 lb 0.3 oz (125.2 kg)  BMI 46.66 kg/m2  SpO2 94% Rt chest tube intact, site clean. Small amt residual fluid in tubing but essentially no output since yesterday. Chest tube removed at bedside without difficulty. Some residual serosanguinous fluid evacuated when tube pulled.   Labs: CBC  Recent Labs  06/04/12 0500 06/05/12 0620  WBC 7.5 8.3  HGB 8.6* 8.8*  HCT 31.0* 31.3*  PLT 289 329   BMET  Recent Labs  06/04/12 0500 06/05/12 0620  NA 134* 138  K 5.2* 4.1  CL 100 101  CO2 28 30  GLUCOSE 172* 159*  BUN 8 6  CREATININE 1.04 0.92  CALCIUM 9.5 9.4   LFT No results found for this basename: PROT, ALBUMIN, AST, ALT, ALKPHOS, BILITOT, BILIDIR, IBILI, LIPASE,  in the last 72 hours PT/INR No results found for this basename: LABPROT, INR,  in the last 72 hours   Studies/Results: Dg Chest 2 View  06/05/2012  *RADIOLOGY REPORT*  Clinical Data: 67 year old female shortness of breath.  Right chest tube.  CHEST - 2 VIEW  Comparison: 06/02/2012 and earlier.  Findings: AP and lateral views of the chest.  Right pigtail pleural drain remains in place.  No pneumothorax identified.  Small volume residual right pleural effusion.  Residual right perihilar opacity. Left lung is stable and clear.  Stable cardiac size and mediastinal contours.  Visualized tracheal air column is within normal limits. Left chest Port-A-Cath and right chest cardiac pacemaker. Postoperative changes to the left axilla.  Stable visualized osseous structures.  IMPRESSION: 1.  Stable right pigtail pleural drain with small residual right pleural effusion. No pneumothorax. 2.  Continued right perihilar opacity.   Original Report Authenticated By: Erskine Speed, M.D.     Assessment/Plan: Rt chest tube for loc effusion placed 2/7- CXR OK,  small residual effusion. Chest removed today. Check post removal CXR. DC plans per primary    LOS: 12 days    Brayton El PA-C 06/05/2012 12:14 PM

## 2012-06-05 NOTE — Progress Notes (Signed)
Medical Student Daily Progress Note  Subjective: Pt reports that she thinks she is back to respiratory baseline.  Shoulder pain is improved.  Appetite, UOP and sleeping ok.  Says that she is having some pain in her feet and requests titration of her Lyrica.  Agrees with plan; feels that she is ready for d/c when chest tube is d/c and TB test has resulted.  Objective: Vital signs in last 24 hours: Filed Vitals:   06/04/12 2046 06/04/12 2245 06/05/12 0725 06/05/12 0814  BP:  141/60 155/77   Pulse:  81 83   Temp:  98.3 F (36.8 C) 99 F (37.2 C)   TempSrc:  Oral Oral   Resp:  18 20   Height:      Weight:      SpO2: 95% 92% 93% 94%   Weight change:   Intake/Output Summary (Last 24 hours) at 06/05/12 0846 Last data filed at 06/05/12 0800  Gross per 24 hour  Intake    600 ml  Output      0 ml  Net    600 ml   Physical Exam: BP 155/77  Pulse 83  Temp(Src) 99 F (37.2 C) (Oral)  Resp 20  Ht 5' 4.5" (1.638 m)  Wt 125.2 kg (276 lb 0.3 oz)  BMI 46.66 kg/m2  SpO2 94% General appearance: cooperative, no distress and receiving breathing tx in bed, a bit drowsy as she had just woken up Lungs: Mildly course BS throughout anterior lung fields.  No wheezing or crackles appreciated.  Chest tube still in place. Heart: regular rate and rhythm, S1, S2 normal, no murmur, click, rub or gallop Abdomen: soft, non-tender; bowel sounds normal; no masses,  no organomegaly Extremities: No peripheral edema appreciated.  Lab Results: Results for orders placed during the hospital encounter of 05/24/12 (from the past 24 hour(s))  GLUCOSE, CAPILLARY     Status: Abnormal   Collection Time    06/04/12 12:14 PM      Result Value Range   Glucose-Capillary 190 (*) 70 - 99 mg/dL  GLUCOSE, CAPILLARY     Status: Abnormal   Collection Time    06/04/12  5:13 PM      Result Value Range   Glucose-Capillary 174 (*) 70 - 99 mg/dL  GLUCOSE, CAPILLARY     Status: Abnormal   Collection Time    06/04/12  10:33 PM      Result Value Range   Glucose-Capillary 141 (*) 70 - 99 mg/dL  BASIC METABOLIC PANEL     Status: Abnormal   Collection Time    06/05/12  6:20 AM      Result Value Range   Sodium 138  135 - 145 mEq/L   Potassium 4.1  3.5 - 5.1 mEq/L   Chloride 101  96 - 112 mEq/L   CO2 30  19 - 32 mEq/L   Glucose, Bld 159 (*) 70 - 99 mg/dL   BUN 6  6 - 23 mg/dL   Creatinine, Ser 0.98  0.50 - 1.10 mg/dL   Calcium 9.4  8.4 - 11.9 mg/dL   GFR calc non Af Amer 64 (*) >90 mL/min   GFR calc Af Amer 75 (*) >90 mL/min  CBC     Status: Abnormal   Collection Time    06/05/12  6:20 AM      Result Value Range   WBC 8.3  4.0 - 10.5 K/uL   RBC 4.37  3.87 - 5.11 MIL/uL   Hemoglobin 8.8 (*)  12.0 - 15.0 g/dL   HCT 16.1 (*) 09.6 - 04.5 %   MCV 71.6 (*) 78.0 - 100.0 fL   MCH 20.1 (*) 26.0 - 34.0 pg   MCHC 28.1 (*) 30.0 - 36.0 g/dL   RDW 40.9 (*) 81.1 - 91.4 %   Platelets 329  150 - 400 K/uL  GLUCOSE, CAPILLARY     Status: Abnormal   Collection Time    06/05/12  8:23 AM      Result Value Range   Glucose-Capillary 181 (*) 70 - 99 mg/dL   Comment 1 Notify RN     Micro Results: Recent Results (from the past 240 hour(s))  BODY FLUID CULTURE     Status: None   Collection Time    05/26/12  5:50 PM      Result Value Range Status   Specimen Description PLEURAL FLUID   Final   Special Requests NONE   Final   Gram Stain     Final   Value: RARE WBC PRESENT, PREDOMINANTLY MONONUCLEAR     NO ORGANISMS SEEN   Culture NO GROWTH 3 DAYS   Final   Report Status 05/30/2012 FINAL   Final  CLOSTRIDIUM DIFFICILE BY PCR     Status: Abnormal   Collection Time    05/30/12  2:44 PM      Result Value Range Status   C difficile by pcr POSITIVE (*) NEGATIVE Final   Comment: CRITICAL RESULT CALLED TO, READ BACK BY AND VERIFIED WITH:     HALLETTM RN 16:40 05/30/12 ([email or web address])   Studies/Results: No results found. Medications: I have reviewed the patient's current medications. Scheduled Meds: . albuterol  2.5 mg  Nebulization BID  . amLODipine  10 mg Oral Daily  . antiseptic oral rinse  15 mL Mouth Rinse BID  . atorvastatin  10 mg Oral q1800  . budesonide  0.5 mg Nebulization BID  . calcium-vitamin D  1 tablet Oral Daily  . carvedilol  12.5 mg Per Tube Q12H  . chlorhexidine  15 mL Mouth Rinse BID  . citalopram  10 mg Oral Daily  . enoxaparin (LOVENOX) injection  120 mg Subcutaneous Q12H  . furosemide  20 mg Oral Daily  . insulin aspart  0-20 Units Subcutaneous TID WC  . ipratropium  0.5 mg Nebulization BID  . irbesartan  150 mg Oral Daily  . metroNIDAZOLE  500 mg Oral Q8H  . multivitamin  5 mL Per Tube Daily  . olopatadine  1 drop Both Eyes BID  . pantoprazole  40 mg Oral Daily  . sodium chloride  10-40 mL Intracatheter Q12H   Continuous Infusions:  PRN Meds:.acetaminophen (TYLENOL) oral liquid 160 mg/5 mL, albuterol, chlorpheniramine-HYDROcodone, ibuprofen, morphine injection, ondansetron (ZOFRAN) IV, oxyCODONE, sodium chloride  Dg Chest 2 View  06/05/2012  *RADIOLOGY REPORT*  Clinical Data: 67 year old female shortness of breath.  Right chest tube.  CHEST - 2 VIEW  Comparison: 06/02/2012 and earlier.  Findings: AP and lateral views of the chest.  Right pigtail pleural drain remains in place.  No pneumothorax identified.  Small volume residual right pleural effusion.  Residual right perihilar opacity. Left lung is stable and clear.  Stable cardiac size and mediastinal contours.  Visualized tracheal air column is within normal limits. Left chest Port-A-Cath and right chest cardiac pacemaker. Postoperative changes to the left axilla.  Stable visualized osseous structures.  IMPRESSION: 1.  Stable right pigtail pleural drain with small residual right pleural effusion. No pneumothorax. 2.  Continued right perihilar  opacity.   Original Report Authenticated By: Erskine Speed, M.D.     Assessment/Plan: Melissa Villa is a 67yo F with PMH pertinent for invasive ductal carcinoma of R breast, CHF, ?COPD, and  OSA, who is primarily being treated for acute respiratory failure, a R pleural effusion, RLE DVT, and C. diff infection.   1. BL (R>>L) pleural effusion: Clinical sx are consistent with pt's respiratory baseline.  The R-sided chest tube has drained 0cc in the last 24 hr per recorded outputs. Can likely d/c chest tube today with low recorded outputs and CXR showing decreased R pleural effusion, if VIR agrees.  Still awaiting quantiferon result.   - F/u quantiferon  - D/c chest tube per VIR recommendations  - Morphine 2mg  q2hr prn for pain   2. ?COPD: Stable.  Continue home regimen. - Albuterol 2.5mg  BID  - Ipratropium 0.5mg  BID  - Budesonide 0.5mg  BID   3. dCHF: Euvolemic on exam; will continue Lasix to maintain. - PO Lasix 20 daily   4. RLE DVT: Stable.  - Continue Lovenox 125mg  BID   5. C. diff infection: Stable. No episodes of diarrhea x 3 days.  - Cont Flagyl   6. Depressed mood: Stable.  - Continue celexa 10mg  daily   7. ?PNA: S/p 7 day course of ceftriaxone. Possible infiltrate in RLL on CXR this AM.  Continue to monitor, but the pt is currently afebrile with a WBC of 8.3 this AM.  8. R invasive ductal carcinoma: NTD. Will schedule fulvestrant therapy on d/c.   9. AKI: Resolved.  - Avoid nephrotoxic drugs   10. Fluctuating hypo/hyperkalemia: K at 4.1 this AM. - Hold repletion today   11. Anemia: Stable; Hgb at 8.8.  12. DM: Moderate control.  - Continue SSI   13. HTN: Ran from 130-170/60-80s again in past 24 hr, despite addition of 1/2 normal ARB dose. Would like to see better control, so will increased ARB to BID. - Continue amlodipine and carvedilol  - Escalate irbesartan to 150mg  BID  14. CAD: Stable.  - Continue atorvastatin.   15. FEN/GI: Stable.  - Regular diet  - Continue ranitidine  16. Dispo: Ready for d/c once quantiferon is resulted and chest tube has been d/c.  Likely tomorrow.   LOS: 12 days   This is a Psychologist, occupational Note.  The care of the  patient was discussed with Dr. Sherrine Maples and the assessment and plan formulated with their assistance.  Please see their attached note for official documentation of the daily encounter.  Wynona Meals Amy 06/05/2012, 8:46 AM

## 2012-06-05 NOTE — Discharge Summary (Signed)
Internal Medicine Teaching [facility] Discharge Note  Name: Melissa Villa MRN: [id number] DOB: 1945-07-14 67 y.o.  Date of Admission: 05/24/2012  4:38 PM Date of Discharge: 06/06/2012 Attending Physician: Rocco Serene, MD  Discharge Diagnosis: Principal Problem:   Acute-on-chronic respiratory failure Active Problems:   DIABETES MELLITUS, TYPE II   OBSTRUCTIVE SLEEP APNEA   HYPERTENSION   CONGESTIVE HEART FAILURE   COPD   Cough   Breast cancer, stage 3   Pleural effusion, right   Respiratory distress   Cancer of breast, female   Acute encephalopathy   HCAP (healthcare-associated pneumonia)   Right leg DVT   Discharge Medications:   Medication List    STOP taking these medications       dipyridamole-aspirin 200-25 MG per 12 hr capsule  Commonly known as:  AGGRENOX      TAKE these medications       albuterol 108 (90 BASE) MCG/ACT inhaler  Commonly known as:  PROAIR HFA  Inhale 2 puffs into the lungs 4 (four) times daily as needed for wheezing or shortness of breath.     albuterol (2.5 MG/3ML) 0.083% nebulizer solution  Commonly known as:  PROVENTIL  Take 3 mLs (2.5 mg total) by nebulization every 6 (six) hours as needed for wheezing or shortness of breath.     amLODipine 10 MG tablet  Commonly known as:  NORVASC  Take 10 mg by mouth daily before breakfast.     BEPREVE 1.5 % Soln  Generic drug:  Bepotastine Besilate  Place 1 drop into both eyes 2 (two) times daily.     budesonide 0.5 MG/2ML nebulizer solution  Commonly known as:  PULMICORT  Take 2 mLs (0.5 mg total) by nebulization 2 (two) times daily.     calcium-vitamin D 250-125 MG-UNIT per tablet  Commonly known as:  OSCAL WITH D  Take 1 tablet by mouth daily.     carvedilol 12.5 MG tablet  Commonly known as:  COREG  Take 2 tablets (25 mg total) by mouth 2 (two) times daily.     cetirizine 10 MG tablet  Commonly known as:  ZYRTEC  Take 10 mg by mouth daily.     citalopram 10 MG tablet   Commonly known as:  CELEXA  Take 1 tablet (10 mg total) by mouth daily.     DEXILANT 60 MG capsule  Generic drug:  dexlansoprazole  Take 60 mg by mouth every morning. Take 15 minutes before breakfast.     enoxaparin 120 MG/0.8ML injection  Commonly known as:  LOVENOX  Inject 0.8 mLs (120 mg total) into the skin every 12 (twelve) hours.     Fluticasone-Salmeterol 250-50 MCG/DOSE Aepb  Commonly known as:  ADVAIR  Inhale 1 puff into the lungs every 12 (twelve) hours.     furosemide 20 MG tablet  Commonly known as:  LASIX  Take 1 tablet (20 mg total) by mouth daily.     insulin glargine 100 UNIT/ML injection  Commonly known as:  LANTUS  Inject 42 Units into the skin at bedtime.     IRON PO  Take 1 tablet by mouth daily.     lidocaine-prilocaine cream  Commonly known as:  EMLA  Apply 1 application topically once. Before procedure     metFORMIN 850 MG tablet  Commonly known as:  GLUCOPHAGE  Take 850 mg by mouth 3 (three) times daily.     metroNIDAZOLE 500 MG tablet  Commonly known as:  FLAGYL  Take 1  tablet (500 mg total) by mouth every 8 (eight) hours.     mometasone 50 MCG/ACT nasal spray  Commonly known as:  NASONEX  Place 2 sprays into the nose daily. scheduled     multivitamin with minerals Tabs  Take 1 tablet by mouth daily.     oxyCODONE 5 MG immediate release tablet  Commonly known as:  Oxy IR/ROXICODONE  Take 1-2 tablets (5-10 mg total) by mouth every 6 (six) hours as needed for pain.     pregabalin 200 MG capsule  Commonly known as:  LYRICA  Take 1 capsule (200 mg total) by mouth 2 (two) times daily.     rosuvastatin 5 MG tablet  Commonly known as:  CRESTOR  Take 10 mg by mouth daily.     sitaGLIPtin 100 MG tablet  Commonly known as:  JANUVIA  Take 100 mg by mouth daily.     tiotropium 18 MCG inhalation capsule  Commonly known as:  SPIRIVA  Place 1 capsule (18 mcg total) into inhaler and inhale daily.     traMADol 50 MG tablet  Commonly known  as:  ULTRAM  Take 50 mg by mouth every 6 (six) hours as needed. For pain     valsartan 80 MG tablet  Commonly known as:  DIOVAN  Take 160 mg by mouth 2 (two) times daily.        Disposition and follow-up:   Melissa Villa was discharged from [facility] in [place] condition.  At the hospital follow up visit:  - Please address her blood sugars and blood pressure control; her blood pressure and CBGs were elevated this admission, she was placed back on all of her home meds prior to discharge - Please check a chest x-ray to evaluate the resolution of her pleural effusion. If the effusion re-accumulates, she will need a pleural biopsy through VATS.  - Please check a BMP to evaluate her renal function and electrolytes.  - Please f/u quantiferon gold lab results to r/o TB- we will follow this up as well. - Celexa was started this admission for depressed mood, this might need to be titrated. - Please check medication compliance with her Lovenox.  - Home health PT/RN/SW/nursing aide were ordered prior to d/c. Please make sure they are working with the pt- PT recommended SNF or 24hr assistance, [name] refused SNF.  - Please make sure she followed up with Oncology for her Chemotherapy  Follow-up Appointments: Follow-up Information   Follow up with [name], MD. (Call to reschedule your missed fulvestrant chemotherapy treatment)    Contact information:   [address] [place] [address] [phone]       Follow up with Carrolyn Meiers, MD. (February 24 at 10:45am to follow-up your hospital stay)    Contact information:   [address] [place] [address] [phone]      Discharge Orders   Future Appointments Provider Department Dept Phone   06/07/2012 2:15 PM [facility] Inj Nurse [facility] [phone]   06/12/2012 10:45 AM Mathis Dad, MD [facility] [phone]   06/29/2012 10:15  AM Krista Blue [facility] MEDICAL ONCOLOGY [phone]   06/29/2012 10:45 AM Amy Allegra Grana, PA [facility] [phone]   06/29/2012 11:15 AM [facility] In[facility] Nurse [facility] [phone]   07/13/2012 10:30 AM Waymon Budge, MD [facility] [phone]   07/27/2012 10:30  AM [facility] In[facility] Nurse [facility] [phone]   08/24/2012 10:30 AM [facility] In[facility] Nurse [place] [facility] [phone]   09/21/2012 10:30 AM [facility] Nurse [facility] [phone]   Future Orders Complete By Expires     Call MD for:  difficulty breathing, headache or visual disturbances  As directed     Call MD for:  extreme fatigue  As directed     Call MD for:  severe uncontrolled pain  As directed     Diet - low sodium heart healthy  As directed     Diet Carb Modified  As directed     Increase activity slowly  As directed        Consultations:  Interventional Radiology   Procedures Performed:  Dg Chest 2 View  06/05/2012  *RADIOLOGY REPORT*  Clinical Data: 67 year old female shortness of breath.  Right chest tube.  CHEST - 2 VIEW  Comparison: 06/02/2012 and earlier.  Findings: AP and lateral views of the chest.  Right pigtail pleural drain remains in place.  No pneumothorax identified.  Small volume residual right pleural effusion.  Residual right perihilar opacity. Left lung is stable and clear.  Stable cardiac size and mediastinal contours.  Visualized tracheal air column is within normal limits. Left chest Port-A-Cath and right chest cardiac pacemaker. Postoperative changes to the left axilla.  Stable visualized osseous structures.  IMPRESSION: 1.  Stable right pigtail pleural drain with small residual right pleural effusion. No pneumothorax. 2.  Continued right perihilar opacity.   Original Report Authenticated By: Erskine Speed, M.D.     Dg Chest 2 View  05/24/2012  *RADIOLOGY REPORT*  Clinical Data: Shortness of breath.  CHEST - 2 VIEW  Comparison: 05/18/2012.  Findings: The power port is stable.  The pacer wires are unchanged. The heart is enlarged but stable.  Mediastinal and hilar contours are prominent but unchanged.  There is a persistent moderate-sized right pleural effusion and underlying pulmonary edema.  No pneumothorax.  IMPRESSION: CHF with moderate sized right pleural effusion.   Original Report Authenticated By: Rudie Meyer, M.D.    Dg Chest 2 View  05/18/2012  *RADIOLOGY REPORT*  Clinical Data: Congestive heart failure with shortness of breath. COPD.  Controlled hypertension.  Diabetes and asthma.  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  CHEST - 2 VIEW  Comparison: 05/16/2012  Findings: A dual lead pacer is stable in position via a right subclavian approach.  A Port-A-Cath is in place via a left internal jugular approach and the tip is stable in the region of the superior cavoatrial junction.  Cardiomegaly is noted and appears unchanged in degree.  Right pleural fluid has increased slightly since the previous exam. The pulmonary edema pattern has improved with pulmonary vascular congestion and mild residual edema identified.  Focal density at the right lung base likely represents a combination of atelectasis, pleural fluid and alveolar edema with a focal infiltrate not completely excluded.  Surgical clips are identified along the right lateral thoracic wall.  A healing rib fractures seen associated the lateral aspect of the right seventh rib.  IMPRESSION: Improving pulmonary edema pattern with residual pulmonary vascular congestion and mild edema suggested.  Increased right pleural effusion with associated right basilar atelectasis.   Original Report Authenticated By: Rhodia Albright, M.D.    Dg Chest 2 View  05/16/2012  *RADIOLOGY REPORT*  Clinical Data: Respiratory failure.  Shortness of breath.  Cough  and wheezing.  COPD.  Congestive heart failure.  CHEST - 2 VIEW  Comparison: 04/06/2012  Findings: Dual lead pacer in place.  Power port tip in good position in the superior vena cava.  The patient has new bilateral pulmonary edema with a moderate right pleural effusion.  No acute osseous abnormality.  IMPRESSION: Moderate bilateral pulmonary edema with a moderate right pleural effusion.   Original Report Authenticated By: Francene Boyers, M.D.    Ct Chest Wo Contrast  05/26/2012  *RADIOLOGY REPORT*  Clinical Data: Right-sided airspace disease and pleural effusion. History of breast cancer, right lower lobectomy, renal failure, right lung carcinoid.  CT CHEST WITHOUT CONTRAST  Technique:  Multidetector CT imaging of the chest was performed following the standard protocol without IV contrast.  Comparison: 04/01/2012  Findings: Diffuse cardiac enlargement.  Calcification in the coronary arteries, aorta, and aortic valve.  Pericardial effusion. Moderate sized right pleural effusion with diffuse atelectasis. Partial aeration of the right lower lung. Small left pleural effusion with basilar atelectasis.  Visualization of the lungs is limited due to respiratory motion artifact and atelectasis. The pleural effusions and atelectatic changes have progressed since the previous study.  No underlying mass lesion is specifically identified, but masses could easily be obscured by the overlying pulmonary parenchymal processes and effusions.  Enteric endotracheal tubes are present.  Diffuse enlargement of the thyroid gland.  Surgical clips in the axillary regions bilaterally.  Old bilateral rib fractures possibly due to thoracotomy.  Cardiac pacemaker with generator pack in the right upper chest.  Limited visualization of the upper abdominal organs.  Diffuse degenerative changes throughout the thoracic spine.  Focal areas of vague sclerosis in the mid thoracic vertebrae are nonspecific but sclerotic metastases are not excluded.   IMPRESSION: Moderate sized right and small left pleural effusions with pulmonary atelectasis bilaterally.  Effusions have increased and aeration has decreased since previous studies.  Persistent cardiac enlargement of pericardial effusion.  Nonspecific foci of sclerosis and mid thoracic vertebra.  Sclerotic metastasis is not excluded.   Original Report Authenticated By: Burman Nieves, M.D.    [place] Chest  05/25/2012  *RADIOLOGY REPORT*  Clinical Data: Shortness of breath, congestive heart failure, pulmonary edema, right-sided pleural effusion  CHEST ULTRASOUND  Comparison: None.  Findings: Limited ultrasound was performed of the right chest for the purposes of possible thoracentesis.  Ultrasound imaging reveals a small effusion, but this is not felt amendable to percutaneous thoracentesis.  No procedure performed  IMPRESSION: Limited ultrasound of chest finds small pleural effusion not amenable for thoracentesis  Read by Brayton El PA-C   Original Report Authenticated By: Richarda Overlie, M.D.    Dg Chest Port 1 View  06/05/2012  *RADIOLOGY REPORT*  Clinical Data: Chest tube removal  PORTABLE CHEST - 1 VIEW  Comparison: 06/05/2012  Findings: Right sided chest tube has been removed.  No pneumothorax.  Small right pleural effusion and right lung infiltrate are unchanged.  Left lung remains clear  IMPRESSION: No pneumothorax following right chest tube removal.   Original Report Authenticated By: Janeece Riggers, M.D.    Dg Chest Port 1 View  06/02/2012  *RADIOLOGY REPORT*  Clinical Data: Right chest tube  PORTABLE CHEST - 1 VIEW  Comparison: Yesterday  Findings: Left subclavian vein Port-A-Cath, right subclavian dual lead pacemaker and leads, right internal jugular central venous catheter, and right chest tube are stable.  There is no pneumothorax.  Volume loss in the right lung with central and basilar dense opacity are stable.  Diffuse edema has improved.  IMPRESSION: Improved edema.  No pneumothorax.  Stable chest  tube.   Original Report Authenticated By: Jolaine Click, M.D.    Dg Chest Port 1 View  06/01/2012  *RADIOLOGY REPORT*  Clinical Data: Right chest tube  PORTABLE CHEST - 1 VIEW  Comparison: 05/31/2012  Findings: Cardiomediastinal silhouette is stable.  Dual lead cardiac pacemaker is unchanged in position.  Stable left IJ Port-A- Cath position.  Stable right pigtail catheter position.  Stable right IJ central line position. Again noted mild interstitial edema without change in aeration. Stable basilar atelectasis or infiltrate.  IMPRESSION: Stable right pigtail catheter position.  Persistent mild congestion/edema.  No diagnostic pneumothorax.   Original Report Authenticated By: Natasha Mead, M.D.    Dg Chest Port 1 View  05/31/2012  *RADIOLOGY REPORT*  Clinical Data: Evaluate right effusion and chest tube  PORTABLE CHEST - 1 VIEW  Comparison: Portable chest x-ray of 05/30/2012  Findings: The lungs are poorly aerated and there is pulmonary vascular congestion present.  Opacity at the right lung base is most consistent with atelectasis and effusion.  Right chest tube, permanent pacemaker, right IJ central venous line, and Port-A-Cath remain.  Cardiomegaly is stable.  IMPRESSION: Slightly diminished aeration with little change in probable edema.   Original Report Authenticated By: Dwyane Dee, M.D.    Dg Chest Port 1 View  05/30/2012  *RADIOLOGY REPORT*  Clinical Data: Right effusion.  Respiratory distress.  PORTABLE CHEST - 1 VIEW  Comparison: 05/29/2012  Findings: Interval removal of endotracheal tube and NG tube.  Right chest pigtail catheter and left Port-A-Cath remain in place, unchanged.  No pneumothorax.  Bilateral airspace disease, right greater than left with small right effusion.  Stable cardiomegaly. No real change since prior study.  IMPRESSION: Interval extubation.  Otherwise no change.  No pneumothorax.   Original Report Authenticated By: Charlett Nose, M.D.    Dg Chest Port 1 View  05/29/2012   *RADIOLOGY REPORT*  Clinical Data: Pneumonia  PORTABLE CHEST - 1 VIEW  Comparison: Yesterday  Findings: Borderline cardiomegaly.  Bilateral pulmonary edema improved.  Right pleural effusion improved.  Right chest tube remains in place.  Right internal jugular vein central venous catheter and left internal jugular vein Port-A-Cath unchanged. Endotracheal and NG tubes stable.  No pneumothorax.  IMPRESSION: Improved edema.  Improved right pleural effusion.   Original Report Authenticated By: Jolaine Click, M.D.    Dg Chest Port 1 View  05/28/2012  *RADIOLOGY REPORT*  Clinical Data: Check endotracheal tube.  Right effusion.  Chest tube.  PORTABLE CHEST - 1 VIEW  Comparison: 05/27/2012  Findings: The patient is rotated towards the right, limiting the study.  Shallow inspiration.  Right pleural effusion with atelectasis or consolidation in the right mid lung.  Right chest tube is in place.  Right central venous catheter, left central venous catheter, endotracheal tube, and enteric tubes are not changed in position. Perihilar infiltration on the left likely represents edema.  No pneumothorax.  Surgical clips in the axilla bilaterally.  Cardiac pacemaker.  IMPRESSION: No significant change since yesterday.  Appliances appear to be stable in position, allowing for patient rotation.  Persistent right pleural effusion and consolidation in the right lung. Perihilar infiltration or edema in the left lung.   Original Report Authenticated By: Burman Nieves, M.D.    Dg Chest Port 1 View  05/27/2012  *RADIOLOGY REPORT*  Clinical Data: 67 year old female pneumonia pleural effusion.  PORTABLE CHEST - 1 VIEW  Comparison: Chest CT 05/26/2012  and earlier.  Findings: Semi upright AP view at 0605 hours.  Pigtail right pleural drain now in place.  Interval decreased right pleural effusion and mild to moderately improved right lung ventilation.  Stable endotracheal tube, visualized enteric tube and left chest Port-A-Cath.  Stable right  chest cardiac pacemaker.  Stable cardiac size and mediastinal contours.  Decreased vascular congestion.  No confluent opacity in the left lung.  No pneumothorax identified.  IMPRESSION: 1.  Right pleural drain placed with decreased pleural effusion and mild to moderately improved right lung ventilation.  Residual fluid with no pneumothorax identified. 2.  Otherwise stable lines and tubes. 3.  Decreased pulmonary vascular congestion.   Original Report Authenticated By: Erskine Speed, M.D.    Dg Chest Port 1 View  05/26/2012  *RADIOLOGY REPORT*  Clinical Data: Right pleural effusion.  PORTABLE CHEST - 1 VIEW  Comparison: 05/25/2012  Findings: Endotracheal tube, central venous catheter, and Port-A- Cath are in place.  Dual lead pacer in place.  There is increased pulmonary vascular congestion with slight left perihilar pulmonary edema.  Large loculated right pleural effusion has increased.  Most of the right lung is compressed.  IMPRESSION:  1.  New pulmonary vascular congestion on the left with new slight perihilar edema. 2.  Increased right effusion.   Original Report Authenticated By: Francene Boyers, M.D.    Dg Chest Port 1 View  05/25/2012  *RADIOLOGY REPORT*  Clinical Data: Endotracheal tube and central line placement  PORTABLE CHEST - 1 VIEW  Comparison: 05/24/2012; 05/18/2012; 05/16/2012  Findings:  Grossly unchanged enlarged cardiac silhouette and mediastinal contours.  Interval intubation with endotracheal tube overlying tracheal air column with tip regional to the level of the carina.  Interval placement of right jugular approach of the venous catheter with tip seen at least to the level of the mid SVC.  Otherwise, stable positioning of remaining support apparatus.  Interval increase in now moderate to large partially loculated right-sided pleural effusion and adjacent opacities within the right lung.  Pulmonary vasculature is indistinct.  Right axillary surgical clips.  Grossly unchanged bones.  IMPRESSION:  1.  Endotracheal tube overlies tracheal air column the tip regional to the carina.  Retraction approximately 2 cm is recommended. 2.  Right jugular approach central venous catheter tip projects over at least the mid SVC.  No pneumothorax. 3.  Interval increase in moderate to large likely loculated right- sided pleural effusion and adjacent opacities within the right lung, atelectasis versus infiltrate.  4.  Suspected worsening pulmonary edema.  This was made a call report.   Original Report Authenticated By: Tacey Ruiz, MD    Ct Perc Pleural Drain W/indwell Cath W/img Guide  05/26/2012  *RADIOLOGY REPORT*  Indication: Increasing right-sided pleural effusion, inability to wean from ventilator  CT GUIDED RIGHT SIDED PLEURAL DRAINAGE CATHETER PLACEMENT  Comparison: Chest CT - 05/25/2012; chest radiograph - 05/26/2012; 05/25/2012; 05/24/2012; chest ultrasound - 05/25/2012  Medications: Fentanyl 50 mcg IV; Versed 2 mg IV  Total Moderate Sedation time: 24 minutes  Contrast: None  Complications: None immediate  Technique / Findings:  Informed written consent was obtained from the patient's family after a discussion of the risks, benefits and alternatives to treatment.  The patient was placed supine on the CT gantry and a pre procedural CT was performed re-demonstrating the known small to moderate-sized right-sided pleural effusion.  The procedure was planned.   A timeout was performed prior to the initiation of the procedure.  The right anterior chest  was prepped and draped in the usual sterile fashion.   The overlying soft tissues were anesthetized with 1% lidocaine with epinephrine.  Appropriate trajectory was planned with the use of a 22 gauge spinal needle.  An 18 gauge trocar needle was advanced into the right pleural space a short Amplatz super stiff wire was coiled within the collection. Appropriate positioning was confirmed with a limited CT scan.  The tract was serially dilated allowing placement of a 10  [place] all- purpose drainage catheter.  Appropriate positioning was confirmed with a limited postprocedural CT scan with the drainage catheter directed towards the right lung apex.  Approximately 400 ml of simple, serous fluid was aspirated from the right pleural space.  The tube was connected to a Pleur-vac and sutured in place.  A dressing was placed.  The patient tolerated the procedure well without immediate post procedural complication.  Impression:  Successful CT guided placement of a 10 [place] all purpose drain catheter into the right pleural space with aspiration of 400 mL of serious, simple appearing pleural fluid.  Samples were sent to the laboratory as requested by the ordering clinical team.   Original Report Authenticated By: Tacey Ruiz, MD    Admission Physical Exam:  Blood pressure 122/71, pulse 60, temperature 98.1 F (36.7 C), temperature source Oral, resp. rate 20, height 5' 4.5" (1.638 m), weight 292 lb 5.3 oz (132.6 kg).  General: NAD, cooperative on examination.  Head: Normocephalic and atraumatic.  Eyes: Pupils equal, round, and reactive to light, no injection and anicteric.  Neck: Supple, no JVD.  Lungs: Diffusely course breath sounds, diminished on right Heart: Regular rate, regular rhythm, no murmur, no gallop, and no rub.  Abdomen: Obese, soft, non-tender, non-distended, normal bowel sounds, no guarding, no rebound tenderness.  Extremities: 2+ pitting edema of BLE, 2+ radial pulses Neurologic: Alert & oriented X3, non-focal  Skin: Turgor normal and no rashes.  Psych: Memory intact for recent and remote, normally interactive, good eye contact, not anxious appearing, and not depressed appearing.   Hospital Course by problem list: Ms. Brocker is a 67 year old woman with a history of breast cancer x 2, carcinoid of the right lower lobe, diastolic heart failure, chronic obstructive pulmonary disease, diabetes, and complete heart block requiring dual-chamber pacemaker  presented with a several week history of increasing shortness of breath and fatigue, admitted for acute respiratory failure, right pleural effusion, RLE DVT, and C. diff infection.   1. Bilateral pleural effusion: Pleural effusions first noted 12/2011 and were very small. Slow progressive increase in effusions since that time, R>L. S/p right chest tube, placed in the ICU on 2/7 by IR. Due to decreased output, it was removed on 2/17 without complication. The big question is what is the cause of this effusion. Possible it is a malignant effusion related to her breast cancer. Peripheral smear and cytology show no malignant cells. Differential also includes tuberculous effusion. Quantiferon gold pending. She will require a repeat CXR in a few weeks as an outpatient.  - F/u Quantiferon gold   2. Ventilator dependant respiratory failure with possible HCAP: She was admitted with worsening SOB from the clinic. On HD #2, she began to have increasing respiratory failure with hypoxia and hypercapnia. She was transferred to the ICU, requiring intubation; extubated on 2/10. Blood and urine cultures were all negative; no respiratory cultures were performed. She was started on Vancomycin and Zosyn on admission to the ICU due to concerns for HCAP, which were later changed to Ceftriaxone.  While we are unsure if she actually had HCAP, but she did complete a 7 day course of Rocephin on 2/13. Since extubation, she has been stable respiratory wise, requiring 2L [place] during the day with her usual CPAP at night.   3. Diastolic CHF: Volume overload on admission, unable to determine the acuity of her volume overload, seems to have been worsening prior to admission despite an escalation of her diuretics. BLE edema present on admission and pulmonary vascular congestion on CXR. ECHO performed on 05/25/12: EF 60 to 65%, grade 1 diastolic dysfx, PAS 104 mmHg. Diuresed with Lasix for a total of at least -8L. Unfortunately, her uop and daily  weights have not been well recorded. Cr remains stable. She appears to be euvolemic.  - Lasix 20 mg PO daily to remain fluid-neutral  - Continue to check I/O's, daily weights, daily creatinine   4. RLE DVT: Seen on BLE duplex on 2/7. She was started on a heparin drip, which was transitioned to Lovenox, which has shown improved long-term survival in cancer pts requiring long-term anticoagulation.  - Lovenox BID  5. C. diff infection: Possibly caused by antibiotics for HCAP. Stool culture positive on 2/11. She was started on Flagyl for C.diff treatment 2/11. Plan to continue for 14 days, on day 8 of 14 on the day of discharge.  - Cont Flagyl, day 8/14   6. R invasive ductal carcinoma: Being treated by Dr. [name]. The Herceptin was discontinued due to concerns for possible CHF. She is currently on Fulvestrant, but is missing a dose while in the hospital. Dr. [name] would like the pt to schedule a f/u appt once she is out of the hospital. He states missing the dose of the Fulvestrant for a week will not negatively affect the patient.   7. AKI: Her Cr began to increase around HD#3, with a peak Cr of 2.47. This has resolved, and since that point, her Cr has been normal with good uop. We have continued to diurese her, but have decreased her Lasix to 20mg  daily, and she remains euvolemic on this dose.   Recent Labs  Lab  06/01/12 0515  06/02/12 1445  06/04/12 0500  06/05/12 0620  06/06/12 0526   CREATININE  0.92  0.80  1.04  0.92  0.89    8. Hyperkalemia/hypokalemia: Potassium fluctuating this admission. Replacing K+ as needed. Prior to discharge, her potassium was 4.0. Thsi will need to be monitored as an outpatient.  Recent Labs  Lab  06/01/12 0515  06/02/12 1445  06/04/12 0500  06/05/12 0620  06/06/12 0526   K  3.5  4.0  5.2*  4.1  4.0    9. Anemia: H/o of anemia of chronic disease. She was transfused 1u PRBCs on 2/7. Since then Hgb has been stable. Hbg 9.2 today.   10.  Acute encephalopathy: Thought to be 2/2 hypoxia and hypercapnea on HD #2. Resolved prior to being transferred out of the ICU.   11. DM: On Metformin and Januvia at home. These were held on admission. She is on resistant SSI. CBGs have been slightly elevated this admission.   12. HTN: BP elevated but stable. On home amlodipine and carvedilol. Held her valsartan initially, but started another ARB, irbesartan, which was increased to BID. BP remains somewhat elevated prior to discharge, but she only received one additional dose of the irbesartan at the time her BP was taken. Restarting her home Valsartan at discharge.  - Continue Amlodipine and carvedilol.  -  Restart Valsartan BID  13. CAD: On Crestor at home. Started Atorvastin this admission due to the formulary, but will change back to Crestor after discharge.  14. Depressed Mood: Celexa was started this admission. Will continue and titrate up as needed as an outpatient. - Celexa 10mg  daily    Discharge Vitals:  BP 170/74  Pulse 87  Temp(Src) 98.9 F (37.2 C) (Oral)  Resp 18  Ht 5' 4.5" (1.638 m)  Wt 279 lb 12.8 oz (126.916 kg)  BMI 47.3 kg/m2  SpO2 92%  Discharge Labs:  Results for orders placed during the hospital encounter of 05/24/12 (from the past 24 hour(s))  GLUCOSE, CAPILLARY     Status: Abnormal   Collection Time    06/05/12  5:17 PM      Result Value Range   Glucose-Capillary 169 (*) 70 - 99 mg/dL   Comment 1 Notify RN    GLUCOSE, CAPILLARY     Status: Abnormal   Collection Time    06/05/12 10:03 PM      Result Value Range   Glucose-Capillary 169 (*) 70 - 99 mg/dL  BASIC METABOLIC PANEL     Status: Abnormal   Collection Time    06/06/12  5:26 AM      Result Value Range   Sodium 141  135 - 145 mEq/L   Potassium 4.0  3.5 - 5.1 mEq/L   Chloride 102  96 - 112 mEq/L   CO2 30  19 - 32 mEq/L   Glucose, Bld 194 (*) 70 - 99 mg/dL   BUN 6  6 - 23 mg/dL   Creatinine, Ser 1.61  0.50 - 1.10 mg/dL   Calcium 9.6  8.4 - 09.6  mg/dL   GFR calc non Af Amer 67 (*) >90 mL/min   GFR calc Af Amer 78 (*) >90 mL/min  CBC     Status: Abnormal   Collection Time    06/06/12  5:26 AM      Result Value Range   WBC 9.4  4.0 - 10.5 K/uL   RBC 4.56  3.87 - 5.11 MIL/uL   Hemoglobin 9.2 (*) 12.0 - 15.0 g/dL   HCT 04.5 (*) 40.9 - 81.1 %   MCV 71.3 (*) 78.0 - 100.0 fL   MCH 20.2 (*) 26.0 - 34.0 pg   MCHC 28.3 (*) 30.0 - 36.0 g/dL   RDW 91.4 (*) 78.2 - 95.6 %   Platelets 321  150 - 400 K/uL  GLUCOSE, CAPILLARY     Status: Abnormal   Collection Time    06/06/12  8:01 AM      Result Value Range   Glucose-Capillary 184 (*) 70 - 99 mg/dL   Comment 1 Notify RN    GLUCOSE, CAPILLARY     Status: Abnormal   Collection Time    06/06/12 12:23 PM      Result Value Range   Glucose-Capillary 314 (*) 70 - 99 mg/dL   Comment 1 Notify RN      Signed: Genelle Gather 06/06/2012, 2:06 PM   Time Spent on Discharge: 35 min Services Ordered on Discharge: Home Health PT/RN/nurses aide/SW Equipment Ordered on Discharge: None

## 2012-06-05 NOTE — Clinical Documentation Improvement (Signed)
CHF DOCUMENTATION CLARIFICATION QUERY  THIS DOCUMENT IS NOT A PERMANENT PART OF THE MEDICAL RECORD   Please update your documentation within the medical record to reflect your response to this query.                                                                                     06/05/12  Dr. Sherrine Maples and/or Associates,  In a better effort to capture your patient's severity of illness, reflect appropriate length of stay and utilization of resources, a review of the patient medical record has revealed the following indicators:  "Diastolic CHF: Volume overload on admission. BLE edema present on admission and pulm vascular congestion on CXR. ECHO performed on 05/25/12: EF 60 to 65%, grade 1 diastolic dysfx, PAS 104 mmHg. Diuresed with 40mg  IV Lasix. Total of [place] -8L. Cr has been stable. Patient was not on lasix prior to admission  -lasix 20 mg PO daily to remain fluid-neutral  -continue to check I/O's, daily weights, daily creatinine" Progress Notes signed by Linward Headland, MD at 06/03/2012 9:50 AM    Based on your clinical judgment, please document the ACUITY of the patient's Diastolic Heart Failure treated this admission in the progress notes and discharge summary:   - Acute   - Acute on Chronic   - Chronic   - Other Acuity   - Unable to Clinically Determine   In responding to this query please exercise your independent judgment.    The fact that a query is asked, does not imply that any particular answer is desired or expected.   Reviewed: additional documentation in the medical record  Thank You,  Jerral Ralph  RN BSN CCDS Certified Clinical Documentation Specialist: Cell   [phone]  Health Information Management [place]    TO RESPOND TO THE THIS QUERY, FOLLOW THE INSTRUCTIONS BELOW:  1. If needed, update documentation for the patient's encounter via the notes activity.  2. Access this query again and click edit on the In [employer].  3. After  updating, or not, click F2 to complete all highlighted (required) fields concerning your review. Select "additional documentation in the medical record" OR "no additional documentation provided".  4. Click Sign note button.  5. The deficiency will fall out of your In Basket *Please let us know if you are not able to complete this workflow by phone or e-mail (listed below).

## 2012-06-05 NOTE — Progress Notes (Addendum)
Resident Co-sign Daily Note: I have seen the patient and reviewed the daily progress note by Jaye Beagle, MS 3 and discussed the care of the patient with her.  See my separate note for documentation of my findings, assessment, and plans.   LOS: 12 days   Genelle Gather 06/05/2012, 1:53 PM

## 2012-06-05 NOTE — Progress Notes (Signed)
Internal Medicine Attending  Date: 06/05/2012  Patient name: Melissa Villa Medical record number: [id number] Date of birth: [birth year] Age: 67 y.o. Gender: female  I saw and evaluated the patient. I reviewed the resident's note by Dr. Sherrine Maples [name] agree with the resident's findings and plans as documented in her progress note.  [name] feels much improved since admission. The chest tube was removed without any acute incident. She is looking to go home. We will make sure she's not interested in the option of nursing home prior to returning home. If not, I suspect she'll be stable for discharge within the next day or so. If her fluid re-accumulates she will require further evaluation with additional fluid studies for cytology and/or VATS associated biopsy. If the effusion does not return we may never determine an etiology but invasive evaluation is unlikely to be helpful given the associated risks in that scenario.

## 2012-06-05 NOTE — Progress Notes (Addendum)
Subjective: The patient states she feels "okay" this morning.  She notes no SOB while sitting, and notes that her breathing is unchanged since yesterday.  Chest tube output appears to have slowed significantly, although there is some discrepancy in pleuro-vac readings, so we are checking a CXR this morning and per IR if there are no loculations, chest tube can likely be removed.  Objective: Vital signs in last 24 hours: Filed Vitals:   06/04/12 2046 06/04/12 2245 06/05/12 0725 06/05/12 0814  BP:  141/60 155/77   Pulse:  81 83   Temp:  98.3 F (36.8 C) 99 F (37.2 C)   TempSrc:  Oral Oral   Resp:  18 20   Height:      Weight:      SpO2: 95% 92% 93% 94%   Physical Exam: General: Lying in bed, NAD  HEENT: CPAP in place, PERRL, EOMI, no scleral icterus  Cardiac: RRR, no rubs, murmurs or gallops  Pulm: breath sounds clear bilaterally, normal work of respiration, chest tube in place  Abd: Obese, soft, nontender  Ext: Warm and well perfused, no edema noted Neuro: Alert and oriented X3, non-focal  Lab Results:  Recent Labs Lab 05/30/12 2111 05/31/12 0505 06/01/12 0515 06/02/12 1445 06/04/12 0500 06/05/12 0620  NA 141 142 142 142 134* 138  K 3.3* 3.3* 3.5 4.0 5.2* 4.1  CL 102 103 102 104 100 101  CO2 33* 32 31 30 28 30   GLUCOSE 135* 140* 147* 184* 172* 159*  BUN 8 8 10 6 8 6   CREATININE 0.85 0.86 0.92 0.80 1.04 0.92  CALCIUM 9.3 9.3 9.6 10.1 9.5 9.4  MG 1.8 1.7  --   --   --   --   PHOS  --  2.8  --   --   --   --     Recent Labs Lab 06/02/12 0500 06/04/12 0500 06/05/12 0620  HGB 9.1* 8.6* 8.8*  HCT 32.7* 31.0* 31.3*  WBC 7.0 7.5 8.3  PLT 208 289 329   Results for orders placed during the hospital encounter of 05/24/12  URINE CULTURE     Status: None   Collection Time    05/25/12  6:09 PM      Result Value Range Status   Specimen Description URINE, CLEAN CATCH   Final   Special Requests Normal   Final   Culture  Setup Time 05/25/2012 19:33   Final   Colony  Count NO GROWTH   Final   Culture NO GROWTH   Final   Report Status 05/27/2012 FINAL   Final  MRSA PCR SCREENING     Status: None   Collection Time    05/25/12  6:15 PM      Result Value Range Status   MRSA by PCR NEGATIVE  NEGATIVE Final   Comment:            The GeneXpert MRSA Assay (FDA     approved for NASAL specimens     only), is one component of a     comprehensive MRSA colonization     surveillance program. It is not     intended to diagnose MRSA     infection nor to guide or     monitor treatment for     MRSA infections.  CULTURE, BLOOD (ROUTINE X 2)     Status: None   Collection Time    05/25/12  7:50 PM      Result Value Range Status  Specimen Description BLOOD LEFT HAND   Final   Special Requests BOTTLES DRAWN AEROBIC ONLY 5CC   Final   Culture  Setup Time 05/26/2012 03:18   Final   Culture NO GROWTH 5 DAYS   Final   Report Status 06/02/2012 FINAL   Final  CULTURE, BLOOD (ROUTINE X 2)     Status: None   Collection Time    05/25/12  7:57 PM      Result Value Range Status   Specimen Description BLOOD RIGHT HAND   Final   Special Requests BOTTLES DRAWN AEROBIC ONLY 3CC   Final   Culture  Setup Time 05/26/2012 03:18   Final   Culture NO GROWTH 5 DAYS   Final   Report Status 06/02/2012 FINAL   Final  BODY FLUID CULTURE     Status: None   Collection Time    05/26/12  5:50 PM      Result Value Range Status   Specimen Description PLEURAL FLUID   Final   Special Requests NONE   Final   Gram Stain     Final   Value: RARE WBC PRESENT, PREDOMINANTLY MONONUCLEAR     NO ORGANISMS SEEN   Culture NO GROWTH 3 DAYS   Final   Report Status 05/30/2012 FINAL   Final  CLOSTRIDIUM DIFFICILE BY PCR     Status: Abnormal   Collection Time    05/30/12  2:44 PM      Result Value Range Status   C difficile by pcr POSITIVE (*) NEGATIVE Final   Comment: CRITICAL RESULT CALLED TO, READ BACK BY AND VERIFIED WITH:     HALLETTM RN 16:40 05/30/12 ([email or web address])  Dg Chest 2 View  05/24/2012   *RADIOLOGY REPORT*  Clinical Data: Shortness of breath.  CHEST - 2 VIEW  Comparison: 05/18/2012.  Findings: The power port is stable.  The pacer wires are unchanged. The heart is enlarged but stable.  Mediastinal and hilar contours are prominent but unchanged.  There is a persistent moderate-sized right pleural effusion and underlying pulmonary edema.  No pneumothorax.  IMPRESSION: CHF with moderate sized right pleural effusion.   Original Report Authenticated By: Rudie Meyer, M.D.    Dg Chest 2 View  05/18/2012  *RADIOLOGY REPORT*  Clinical Data: Congestive heart failure with shortness of breath. COPD.  Controlled hypertension.  Diabetes and asthma.  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  CHEST - 2 VIEW  Comparison: 05/16/2012  Findings: A dual lead pacer is stable in position via a right subclavian approach.  A Port-A-Cath is in place via a left internal jugular approach and the tip is stable in the region of the superior cavoatrial junction.  Cardiomegaly is noted and appears unchanged in degree.  Right pleural fluid has increased slightly since the previous exam. The pulmonary edema pattern has improved with pulmonary vascular congestion and mild residual edema identified.  Focal density at the right lung base likely represents a combination of atelectasis, pleural fluid and alveolar edema with a focal infiltrate not completely excluded.  Surgical clips are identified along the right lateral thoracic wall.  A healing rib fractures seen associated the lateral aspect of the right seventh rib.  IMPRESSION: Improving pulmonary edema pattern with residual pulmonary vascular congestion and mild edema suggested.  Increased right pleural effusion with associated right basilar atelectasis.   Original Report Authenticated By: Rhodia Albright, M.D.    Dg Chest 2 View  05/16/2012  *RADIOLOGY REPORT*  Clinical Data: Respiratory  failure.  Shortness of breath.  Cough and wheezing.  COPD.  Congestive  heart failure.  CHEST - 2 VIEW  Comparison: 04/06/2012  Findings: Dual lead pacer in place.  Power port tip in good position in the superior vena cava.  The patient has new bilateral pulmonary edema with a moderate right pleural effusion.  No acute osseous abnormality.  IMPRESSION: Moderate bilateral pulmonary edema with a moderate right pleural effusion.   Original Report Authenticated By: Francene Boyers, M.D.    Ct Chest Wo Contrast  05/26/2012  *RADIOLOGY REPORT*  Clinical Data: Right-sided airspace disease and pleural effusion. History of breast cancer, right lower lobectomy, renal failure, right lung carcinoid.  CT CHEST WITHOUT CONTRAST  Technique:  Multidetector CT imaging of the chest was performed following the standard protocol without IV contrast.  Comparison: 04/01/2012  Findings: Diffuse cardiac enlargement.  Calcification in the coronary arteries, aorta, and aortic valve.  Pericardial effusion. Moderate sized right pleural effusion with diffuse atelectasis. Partial aeration of the right lower lung. Small left pleural effusion with basilar atelectasis.  Visualization of the lungs is limited due to respiratory motion artifact and atelectasis. The pleural effusions and atelectatic changes have progressed since the previous study.  No underlying mass lesion is specifically identified, but masses could easily be obscured by the overlying pulmonary parenchymal processes and effusions.  Enteric endotracheal tubes are present.  Diffuse enlargement of the thyroid gland.  Surgical clips in the axillary regions bilaterally.  Old bilateral rib fractures possibly due to thoracotomy.  Cardiac pacemaker with generator pack in the right upper chest.  Limited visualization of the upper abdominal organs.  Diffuse degenerative changes throughout the thoracic spine.  Focal areas of vague sclerosis in the mid thoracic vertebrae are nonspecific but sclerotic metastases are not excluded.  IMPRESSION: Moderate sized right  and small left pleural effusions with pulmonary atelectasis bilaterally.  Effusions have increased and aeration has decreased since previous studies.  Persistent cardiac enlargement of pericardial effusion.  Nonspecific foci of sclerosis and mid thoracic vertebra.  Sclerotic metastasis is not excluded.   Original Report Authenticated By: Burman Nieves, M.D.    [place] Chest  05/25/2012  *RADIOLOGY REPORT*  Clinical Data: Shortness of breath, congestive heart failure, pulmonary edema, right-sided pleural effusion  CHEST ULTRASOUND  Comparison: None.  Findings: Limited ultrasound was performed of the right chest for the purposes of possible thoracentesis.  Ultrasound imaging reveals a small effusion, but this is not felt amendable to percutaneous thoracentesis.  No procedure performed  IMPRESSION: Limited ultrasound of chest finds small pleural effusion not amenable for thoracentesis  Read by Brayton El PA-C   Original Report Authenticated By: Richarda Overlie, M.D.    Dg Chest Port 1 View  06/02/2012  *RADIOLOGY REPORT*  Clinical Data: Right chest tube  PORTABLE CHEST - 1 VIEW  Comparison: Yesterday  Findings: Left subclavian vein Port-A-Cath, right subclavian dual lead pacemaker and leads, right internal jugular central venous catheter, and right chest tube are stable.  There is no pneumothorax.  Volume loss in the right lung with central and basilar dense opacity are stable.    Diffuse edema has improved.  IMPRESSION: Improved edema.  No pneumothorax.  Stable chest tube.   Original Report Authenticated By: Jolaine Click, M.D.    Dg Chest Port 1 View  06/01/2012  *RADIOLOGY REPORT*  Clinical Data: Right chest tube  PORTABLE CHEST - 1 VIEW  Comparison: 05/31/2012  Findings: Cardiomediastinal silhouette is stable.  Dual lead cardiac pacemaker is unchanged in  position.  Stable left IJ Port-A- Cath position.  Stable right pigtail catheter position.  Stable right IJ central line position. Again noted mild interstitial  edema without change in aeration. Stable basilar atelectasis or infiltrate.  IMPRESSION: Stable right pigtail catheter position.  Persistent mild congestion/edema.  No diagnostic pneumothorax.   Original Report Authenticated By: Natasha Mead, M.D.    Dg Chest Port 1 View  05/31/2012  *RADIOLOGY REPORT*  Clinical Data: Evaluate right effusion and chest tube  PORTABLE CHEST - 1 VIEW  Comparison: Portable chest x-ray of 05/30/2012  Findings: The lungs are poorly aerated and there is pulmonary vascular congestion present.  Opacity at the right lung base is most consistent with atelectasis and effusion.  Right chest tube, permanent pacemaker, right IJ central venous line, and Port-A-Cath remain.  Cardiomegaly is stable.  IMPRESSION: Slightly diminished aeration with little change in probable edema.   Original Report Authenticated By: Dwyane Dee, M.D.    Dg Chest Port 1 View  05/30/2012  *RADIOLOGY REPORT*  Clinical Data: Right effusion.  Respiratory distress.  PORTABLE CHEST - 1 VIEW  Comparison: 05/29/2012  Findings: Interval removal of endotracheal tube and NG tube.  Right chest pigtail catheter and left Port-A-Cath remain in place, unchanged.  No pneumothorax.  Bilateral airspace disease, right greater than left with small right effusion.  Stable cardiomegaly. No real change since prior study.  IMPRESSION: Interval extubation.  Otherwise no change.  No pneumothorax.   Original Report Authenticated By: Charlett Nose, M.D.    Dg Chest Port 1 View  05/29/2012  *RADIOLOGY REPORT*  Clinical Data: Pneumonia  PORTABLE CHEST - 1 VIEW  Comparison: Yesterday  Findings: Borderline cardiomegaly.  Bilateral pulmonary edema improved.  Right pleural effusion improved.  Right chest tube remains in place.  Right internal jugular vein central venous catheter and left internal jugular vein Port-A-Cath unchanged. Endotracheal and NG tubes stable.  No pneumothorax.  IMPRESSION: Improved edema.  Improved right pleural effusion.    Original Report Authenticated By: Jolaine Click, M.D.    Dg Chest Port 1 View  05/28/2012  *RADIOLOGY REPORT*  Clinical Data: Check endotracheal tube.  Right effusion.  Chest tube.  PORTABLE CHEST - 1 VIEW  Comparison: 05/27/2012  Findings: The patient is rotated towards the right, limiting the study.  Shallow inspiration.  Right pleural effusion with atelectasis or consolidation in the right mid lung.  Right chest tube is in place.  Right central venous catheter, left central venous catheter, endotracheal tube, and enteric tubes are not changed in position. Perihilar infiltration on the left likely represents edema.  No pneumothorax.  Surgical clips in the axilla bilaterally.  Cardiac pacemaker.  IMPRESSION: No significant change since yesterday.  Appliances appear to be stable in position, allowing for patient rotation.  Persistent right pleural effusion and consolidation in the right lung. Perihilar infiltration or edema in the left lung.   Original Report Authenticated By: Burman Nieves, M.D.    Dg Chest Port 1 View  05/27/2012  *RADIOLOGY REPORT*  Clinical Data: 67 year old female pneumonia pleural effusion.  PORTABLE CHEST - 1 VIEW  Comparison: Chest CT 05/26/2012 and earlier.  Findings: Semi upright AP view at 0605 hours.  Pigtail right pleural drain now in place.  Interval decreased right pleural effusion and mild to moderately improved right lung ventilation.  Stable endotracheal tube, visualized enteric tube and left chest Port-A-Cath.  Stable right chest cardiac pacemaker.  Stable cardiac size and mediastinal contours.  Decreased vascular congestion.  No confluent opacity in  the left lung.  No pneumothorax identified.  IMPRESSION: 1.  Right pleural drain placed with decreased pleural effusion and mild to moderately improved right lung ventilation.  Residual fluid with no pneumothorax identified. 2.  Otherwise stable lines and tubes. 3.  Decreased pulmonary vascular congestion.   Original Report  Authenticated By: Erskine Speed, M.D.    Dg Chest Port 1 View  05/26/2012  *RADIOLOGY REPORT*  Clinical Data: Right pleural effusion.  PORTABLE CHEST - 1 VIEW  Comparison: 05/25/2012  Findings: Endotracheal tube, central venous catheter, and Port-A- Cath are in place.  Dual lead pacer in place.  There is increased pulmonary vascular congestion with slight left perihilar pulmonary edema.  Large loculated right pleural effusion has increased.  Most of the right lung is compressed.  IMPRESSION:  1.  New pulmonary vascular congestion on the left with new slight perihilar edema. 2.  Increased right effusion.   Original Report Authenticated By: Francene Boyers, M.D.    Dg Chest Port 1 View  05/25/2012  *RADIOLOGY REPORT*  Clinical Data: Endotracheal tube and central line placement  PORTABLE CHEST - 1 VIEW  Comparison: 05/24/2012; 05/18/2012; 05/16/2012  Findings:  Grossly unchanged enlarged cardiac silhouette and mediastinal contours.  Interval intubation with endotracheal tube overlying tracheal air column with tip regional to the level of the carina.  Interval placement of right jugular approach of the venous catheter with tip seen at least to the level of the mid SVC.  Otherwise, stable positioning of remaining support apparatus.  Interval increase in now moderate to large partially loculated right-sided pleural effusion and adjacent opacities within the right lung.  Pulmonary vasculature is indistinct.  Right axillary surgical clips.  Grossly unchanged bones.  IMPRESSION: 1.  Endotracheal tube overlies tracheal air column the tip regional to the carina.  Retraction approximately 2 cm is recommended. 2.  Right jugular approach central venous catheter tip projects over at least the mid SVC.  No pneumothorax. 3.  Interval increase in moderate to large likely loculated right- sided pleural effusion and adjacent opacities within the right lung, atelectasis versus infiltrate.  4.  Suspected worsening pulmonary edema.  This  was made a call report.   Original Report Authenticated By: Tacey Ruiz, MD    Ct Perc Pleural Drain W/indwell Cath W/img Guide  05/26/2012  *RADIOLOGY REPORT*  Indication: Increasing right-sided pleural effusion, inability to wean from ventilator  CT GUIDED RIGHT SIDED PLEURAL DRAINAGE CATHETER PLACEMENT  Comparison: Chest CT - 05/25/2012; chest radiograph - 05/26/2012; 05/25/2012; 05/24/2012; chest ultrasound - 05/25/2012  Medications: Fentanyl 50 mcg IV; Versed 2 mg IV  Total Moderate Sedation time: 24 minutes  Contrast: None  Complications: None immediate  Technique / Findings:  Informed written consent was obtained from the patient's family after a discussion of the risks, benefits and alternatives to treatment.  The patient was placed supine on the CT gantry and a pre procedural CT was performed re-demonstrating the known small to moderate-sized right-sided pleural effusion.  The procedure was planned.   A timeout was performed prior to the initiation of the procedure.  The right anterior chest was prepped and draped in the usual sterile fashion.   The overlying soft tissues were anesthetized with 1% lidocaine with epinephrine.  Appropriate trajectory was planned with the use of a 22 gauge spinal needle.  An 18 gauge trocar needle was advanced into the right pleural space a short Amplatz super stiff wire was coiled within the collection. Appropriate positioning was confirmed with a  limited CT scan.  The tract was serially dilated allowing placement of a 10 [place] all- purpose drainage catheter.  Appropriate positioning was confirmed with a limited postprocedural CT scan with the drainage catheter directed towards the right lung apex.  Approximately 400 ml of simple, serous fluid was aspirated from the right pleural space.  The tube was connected to a Pleur-vac and sutured in place.  A dressing was placed.  The patient tolerated the procedure well without immediate post procedural complication.  Impression:   Successful CT guided placement of a 10 [place] all purpose drain catheter into the right pleural space with aspiration of 400 mL of serious, simple appearing pleural fluid.  Samples were sent to the laboratory as requested by the ordering clinical team.   Original Report Authenticated By: Tacey Ruiz, MD     Medications: I have reviewed the patient's current medications. Scheduled Meds: . albuterol  2.5 mg Nebulization BID  . amLODipine  10 mg Oral Daily  . antiseptic oral rinse  15 mL Mouth Rinse BID  . atorvastatin  10 mg Oral q1800  . budesonide  0.5 mg Nebulization BID  . calcium-vitamin D  1 tablet Oral Daily  . carvedilol  12.5 mg Per Tube Q12H  . chlorhexidine  15 mL Mouth Rinse BID  . citalopram  10 mg Oral Daily  . enoxaparin (LOVENOX) injection  120 mg Subcutaneous Q12H  . furosemide  20 mg Oral Daily  . insulin aspart  0-20 Units Subcutaneous TID WC  . ipratropium  0.5 mg Nebulization BID  . irbesartan  150 mg Oral Daily  . metroNIDAZOLE  500 mg Oral Q8H  . multivitamin  5 mL Per Tube Daily  . olopatadine  1 drop Both Eyes BID  . pantoprazole  40 mg Oral Daily  . sodium chloride  10-40 mL Intracatheter Q12H   Continuous Infusions:  PRN Meds:.acetaminophen (TYLENOL) oral liquid 160 mg/5 mL, albuterol, chlorpheniramine-HYDROcodone, ibuprofen, morphine injection, ondansetron (ZOFRAN) IV, oxyCODONE, sodium chloride  Assessment/Plan: Ms. Murdy is a 67 year old woman with a history of breast cancer x 2, carcinoid of the right lower lobe, diastolic heart failure, chronic obstructive pulmonary disease, diabetes, and complete heart block requiring dual-chamber pacemaker who presents with a several week history of increasing shortness of breath and fatigue, and is beign treated for acute respiratory failure, a R pleural effusion, RLE DVT, and C. diff infection.   1. Bilateral pleural effusion: Pleural effusions first noted 12/2011 and were very small. Slow progressive increase in  effusions since that time, R>L. S/p right chest tube, placed in the ICU on 2/7. Over the past 24hrs 60cc from the CT, which is improved. The big question is what is the cause of this effusion. Possible it is a malignant effusion related to her breast cancer. Peripheral smear and cytology show no malignant cells. Differential also includes tuberculous effusion. Quantiferon gold pending; will d/c respiratory isolation if negative  - F/u Quantiferon gold - F/u with IR re: chest tube removal  2. ?HCAP: Resolved. She was started on Vanc and Zosyn on admission to the ICU, which was changed to Ceftriaxone. While we are unsure if she actually had HCAP, she completed 7 day course of Rocephin on 2/13.   3. Diastolic CHF: Volume overload on admission. BLE edema present on admission and pulm vascular congestion on CXR. ECHO performed on 05/25/12: EF 60 to 65%, grade 1 diastolic dysfx, PAS 104 mmHg. Diuresing with Lasix for a total of at least -8L. Unfortunately, her  uop and daily weights have not been recorded. Cr remains stable. Patient was not on lasix prior to admission  - Lasix 20 mg PO daily to remain fluid-neutral  - Continue to check I/O's, daily weights, daily creatinine   4. RLE DVT: Seen on BLE duplex on 2/7. She was started on a heparin drip, which was transitioned to Lovenox, which has shown improved long-term survival in cancer pts requiring long-term anticoagulation.  - Lovenox per Pharmacy   5. C. diff infection: Possibly caused by antibiotics for HCAP. Started on Flagyl for C.diff treatment 2/11. Plan to continue for 10-14 days.  - Cont Flagyl, day 7/14   6. R invasive ductal carcinoma: Being treated by Dr. [name]. The Herceptin was discontinued due to concerns for possible CHF. She is currently on Fulvestrant, but is missing a dose while in the hospital. Dr. [name] would like the pt to schedule a f/u appt once she is out of the hospital. He states missing the dose of the Fulvestrant for a week  will not negatively affect the patient.   7. AKI: Resolved.  Recent Labs Lab 05/31/12 0505 06/01/12 0515 06/02/12 1445 06/04/12 0500 06/05/12 0620  CREATININE 0.86 0.92 0.80 1.04 0.92    8. Hypokalemia: Resolved. Replacing K+ as needed.   Recent Labs Lab 05/31/12 0505 06/01/12 0515 06/02/12 1445 06/04/12 0500 06/05/12 0620  K 3.3* 3.5 4.0 5.2* 4.1   9. Anemia: Stable. Hbg 8.8 today.   10. Acute encephalopathy: Resolved. Thought to be 2/2 hypoxia and hypercapnea on HD #2.   11. DM: On Metformin and Januvia at home. These were held on admission. She is on resistant SSI. CBGs have been slightly elevated this admission.  12. HTN: BP elevated but stable. On home amlodipine and carvedilol. Held her valsartan initially, but have restarted an ARB. BP still elevated. Increasing ARB freq to BID. - Continue Amlodipine and carvedilol. - Increase Irbesartan to BID  13. CAD: On Crestor at home. Started Atorvastin this admission.  14. Depressed Mood: Celexa was started this admission. Will continue and titrate up as needed. - Celexa 10mg  daily  15. Dispo: D/c pending further clinical improvement, likely 1-2 days   The patient does have a current PCP [name], MD), therefore will be requiring OPC follow-up after discharge.   The patient does not have transportation limitations that hinder transportation to clinic appointments.   LOS: 12 days   Genelle Gather 06/05/2012, 8:19 AM

## 2012-06-06 DIAGNOSIS — C50919 Malignant neoplasm of unspecified site of unspecified female breast: Secondary | ICD-10-CM

## 2012-06-06 DIAGNOSIS — G4733 Obstructive sleep apnea (adult) (pediatric): Secondary | ICD-10-CM

## 2012-06-06 DIAGNOSIS — I10 Essential (primary) hypertension: Secondary | ICD-10-CM

## 2012-06-06 LAB — BASIC METABOLIC PANEL
CO2: 30 mEq/L (ref 19–32)
GFR calc non Af Amer: 67 mL/min — ABNORMAL LOW (ref >90)
Glucose, Bld: 194 mg/dL — ABNORMAL HIGH (ref 70–99)
Potassium: 4 mEq/L (ref 3.5–5.1)
Sodium: 141 mEq/L (ref 135–145)

## 2012-06-06 LAB — GLUCOSE, CAPILLARY: Glucose-Capillary: 314 mg/dL — ABNORMAL HIGH (ref 70–99)

## 2012-06-06 LAB — CBC
Hemoglobin: 9.2 g/dL — ABNORMAL LOW (ref 12.0–15.0)
MCH: 20.2 pg — ABNORMAL LOW (ref 26.0–34.0)
RBC: 4.56 MIL/uL (ref 3.87–5.11)

## 2012-06-06 MED ORDER — HEPARIN SOD (PORK) LOCK FLUSH 100 UNIT/ML IV SOLN
500.0000 [IU] | INTRAVENOUS | Status: AC | PRN
  Administered 2012-06-06: 500 [IU]

## 2012-06-06 MED ORDER — CARVEDILOL 12.5 MG PO TABS
12.5000 mg | ORAL_TABLET | Freq: Two times a day (BID) | ORAL | Status: DC
  Administered 2012-06-06: 12.5 mg via ORAL
  Filled 2012-06-06 (×2): qty 1

## 2012-06-06 MED ORDER — ENOXAPARIN SODIUM 120 MG/0.8ML ~~LOC~~ SOLN: 120.0000 mg | Freq: Two times a day (BID) | SUBCUTANEOUS | Status: DC

## 2012-06-06 MED ORDER — METRONIDAZOLE 500 MG PO TABS: 500.0000 mg | ORAL_TABLET | Freq: Three times a day (TID) | ORAL | Status: DC

## 2012-06-06 MED ORDER — CITALOPRAM HYDROBROMIDE 10 MG PO TABS: 10.0000 mg | ORAL_TABLET | Freq: Every day | ORAL | Status: AC

## 2012-06-06 MED ORDER — FUROSEMIDE 20 MG PO TABS: 20.0000 mg | ORAL_TABLET | Freq: Every day | ORAL | Status: AC

## 2012-06-06 NOTE — Progress Notes (Signed)
Subjective: The patient states she feels "okay" this morning.  She notes no SOB while sitting, and notes that her breathing is unchanged since yesterday.  Chest tube was removed yesterday, no pneumothorax post removal.  Objective: Vital signs in last 24 hours: Filed Vitals:   06/05/12 1349 06/05/12 2050 06/05/12 2205 06/06/12 0623  BP: 145/54  149/54 170/74  Pulse: 88  90 87  Temp: 97.8 F (36.6 C)  98.4 F (36.9 C) 98.9 F (37.2 C)  TempSrc: Oral  Oral Oral  Resp: 18  18 18   Height:      Weight:    279 lb 12.8 oz (126.916 kg)  SpO2: 96% 95% 92% 92%   Physical Exam: General: Lying in bed, NAD  HEENT: CPAP in place, PERRL, EOMI, no scleral icterus  Cardiac: RRR, no rubs, murmurs or gallops  Pulm: Breath sounds clear bilaterally, normal work of respiration, chest tube in place Abd: Obese, soft, nontender  Ext: Warm and well perfused, no edema noted Neuro: Alert and oriented X3, non-focal Skin: Right breast with bleeding follicle, controlled with pressure.  Lab Results:  Recent Labs Lab 05/30/12 2111 05/31/12 0505 06/01/12 0515 06/02/12 1445 06/04/12 0500 06/05/12 0620 06/06/12 0526  NA 141 142 142 142 134* 138 141  K 3.3* 3.3* 3.5 4.0 5.2* 4.1 4.0  CL 102 103 102 104 100 101 102  CO2 33* 32 31 30 28 30 30   GLUCOSE 135* 140* 147* 184* 172* 159* 194*  BUN 8 8 10 6 8 6 6   CREATININE 0.85 0.86 0.92 0.80 1.04 0.92 0.89  CALCIUM 9.3 9.3 9.6 10.1 9.5 9.4 9.6  MG 1.8 1.7  --   --   --   --   --   PHOS  --  2.8  --   --   --   --   --     Recent Labs Lab 06/04/12 0500 06/05/12 0620 06/06/12 0526  HGB 8.6* 8.8* 9.2*  HCT 31.0* 31.3* 32.5*  WBC 7.5 8.3 9.4  PLT 289 329 321   Results for orders placed during the hospital encounter of 05/24/12  URINE CULTURE     Status: None   Collection Time    05/25/12  6:09 PM      Result Value Range Status   Specimen Description URINE, CLEAN CATCH   Final   Special Requests Normal   Final   Culture  Setup Time 05/25/2012  19:33   Final   Colony Count NO GROWTH   Final   Culture NO GROWTH   Final   Report Status 05/27/2012 FINAL   Final  MRSA PCR SCREENING     Status: None   Collection Time    05/25/12  6:15 PM      Result Value Range Status   MRSA by PCR NEGATIVE  NEGATIVE Final   Comment:            The GeneXpert MRSA Assay (FDA     approved for NASAL specimens     only), is one component of a     comprehensive MRSA colonization     surveillance program. It is not     intended to diagnose MRSA     infection nor to guide or     monitor treatment for     MRSA infections.  CULTURE, BLOOD (ROUTINE X 2)     Status: None   Collection Time    05/25/12  7:50 PM      Result Value  Range Status   Specimen Description BLOOD LEFT HAND   Final   Special Requests BOTTLES DRAWN AEROBIC ONLY 5CC   Final   Culture  Setup Time 05/26/2012 03:18   Final   Culture NO GROWTH 5 DAYS   Final   Report Status 06/02/2012 FINAL   Final  CULTURE, BLOOD (ROUTINE X 2)     Status: None   Collection Time    05/25/12  7:57 PM      Result Value Range Status   Specimen Description BLOOD RIGHT HAND   Final   Special Requests BOTTLES DRAWN AEROBIC ONLY 3CC   Final   Culture  Setup Time 05/26/2012 03:18   Final   Culture NO GROWTH 5 DAYS   Final   Report Status 06/02/2012 FINAL   Final  BODY FLUID CULTURE     Status: None   Collection Time    05/26/12  5:50 PM      Result Value Range Status   Specimen Description PLEURAL FLUID   Final   Special Requests NONE   Final   Gram Stain     Final   Value: RARE WBC PRESENT, PREDOMINANTLY MONONUCLEAR     NO ORGANISMS SEEN   Culture NO GROWTH 3 DAYS   Final   Report Status 05/30/2012 FINAL   Final  CLOSTRIDIUM DIFFICILE BY PCR     Status: Abnormal   Collection Time    05/30/12  2:44 PM      Result Value Range Status   C difficile by pcr POSITIVE (*) NEGATIVE Final   Comment: CRITICAL RESULT CALLED TO, READ BACK BY AND VERIFIED WITH:     HALLETTM RN 16:40 05/30/12 ([email or web address])  Dg  Chest 2 View  05/24/2012  *RADIOLOGY REPORT*  Clinical Data: Shortness of breath.  CHEST - 2 VIEW  Comparison: 05/18/2012.  Findings: The power port is stable.  The pacer wires are unchanged. The heart is enlarged but stable.  Mediastinal and hilar contours are prominent but unchanged.  There is a persistent moderate-sized right pleural effusion and underlying pulmonary edema.  No pneumothorax.  IMPRESSION: CHF with moderate sized right pleural effusion.   Original Report Authenticated By: Rudie Meyer, M.D.    Dg Chest 2 View  05/18/2012  *RADIOLOGY REPORT*  Clinical Data: Congestive heart failure with shortness of breath. COPD.  Controlled hypertension.  Diabetes and asthma.  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  CHEST - 2 VIEW  Comparison: 05/16/2012  Findings: A dual lead pacer is stable in position via a right subclavian approach.  A Port-A-Cath is in place via a left internal jugular approach and the tip is stable in the region of the superior cavoatrial junction.  Cardiomegaly is noted and appears unchanged in degree.  Right pleural fluid has increased slightly since the previous exam. The pulmonary edema pattern has improved with pulmonary vascular congestion and mild residual edema identified.  Focal density at the right lung base likely represents a combination of atelectasis, pleural fluid and alveolar edema with a focal infiltrate not completely excluded.  Surgical clips are identified along the right lateral thoracic wall.  A healing rib fractures seen associated the lateral aspect of the right seventh rib.  IMPRESSION: Improving pulmonary edema pattern with residual pulmonary vascular congestion and mild edema suggested.  Increased right pleural effusion with associated right basilar atelectasis.   Original Report Authenticated By: Rhodia Albright, M.D.    Dg Chest 2 View  05/16/2012  *RADIOLOGY REPORT*  Clinical Data: Respiratory failure.  Shortness of breath.  Cough and  wheezing.  COPD.  Congestive heart failure.  CHEST - 2 VIEW  Comparison: 04/06/2012  Findings: Dual lead pacer in place.  Power port tip in good position in the superior vena cava.  The patient has new bilateral pulmonary edema with a moderate right pleural effusion.  No acute osseous abnormality.  IMPRESSION: Moderate bilateral pulmonary edema with a moderate right pleural effusion.   Original Report Authenticated By: Francene Boyers, M.D.    Ct Chest Wo Contrast  05/26/2012  *RADIOLOGY REPORT*  Clinical Data: Right-sided airspace disease and pleural effusion. History of breast cancer, right lower lobectomy, renal failure, right lung carcinoid.  CT CHEST WITHOUT CONTRAST  Technique:  Multidetector CT imaging of the chest was performed following the standard protocol without IV contrast.  Comparison: 04/01/2012  Findings: Diffuse cardiac enlargement.  Calcification in the coronary arteries, aorta, and aortic valve.  Pericardial effusion. Moderate sized right pleural effusion with diffuse atelectasis. Partial aeration of the right lower lung. Small left pleural effusion with basilar atelectasis.  Visualization of the lungs is limited due to respiratory motion artifact and atelectasis. The pleural effusions and atelectatic changes have progressed since the previous study.  No underlying mass lesion is specifically identified, but masses could easily be obscured by the overlying pulmonary parenchymal processes and effusions.  Enteric endotracheal tubes are present.  Diffuse enlargement of the thyroid gland.  Surgical clips in the axillary regions bilaterally.  Old bilateral rib fractures possibly due to thoracotomy.  Cardiac pacemaker with generator pack in the right upper chest.  Limited visualization of the upper abdominal organs.  Diffuse degenerative changes throughout the thoracic spine.  Focal areas of vague sclerosis in the mid thoracic vertebrae are nonspecific but sclerotic metastases are not excluded.   IMPRESSION: Moderate sized right and small left pleural effusions with pulmonary atelectasis bilaterally.  Effusions have increased and aeration has decreased since previous studies.  Persistent cardiac enlargement of pericardial effusion.  Nonspecific foci of sclerosis and mid thoracic vertebra.  Sclerotic metastasis is not excluded.   Original Report Authenticated By: Burman Nieves, M.D.    [place] Chest  05/25/2012  *RADIOLOGY REPORT*  Clinical Data: Shortness of breath, congestive heart failure, pulmonary edema, right-sided pleural effusion  CHEST ULTRASOUND  Comparison: None.  Findings: Limited ultrasound was performed of the right chest for the purposes of possible thoracentesis.  Ultrasound imaging reveals a small effusion, but this is not felt amendable to percutaneous thoracentesis.  No procedure performed  IMPRESSION: Limited ultrasound of chest finds small pleural effusion not amenable for thoracentesis  Read by Brayton El PA-C   Original Report Authenticated By: Richarda Overlie, M.D.    Dg Chest Port 1 View  06/02/2012  *RADIOLOGY REPORT*  Clinical Data: Right chest tube  PORTABLE CHEST - 1 VIEW  Comparison: Yesterday  Findings: Left subclavian vein Port-A-Cath, right subclavian dual lead pacemaker and leads, right internal jugular central venous catheter, and right chest tube are stable.  There is no pneumothorax.  Volume loss in the right lung with central and basilar dense opacity are stable.    Diffuse edema has improved.  IMPRESSION: Improved edema.  No pneumothorax.  Stable chest tube.   Original Report Authenticated By: Jolaine Click, M.D.    Dg Chest Port 1 View  06/01/2012  *RADIOLOGY REPORT*  Clinical Data: Right chest tube  PORTABLE CHEST - 1 VIEW  Comparison: 05/31/2012  Findings: Cardiomediastinal silhouette is stable.  Dual lead cardiac pacemaker  is unchanged in position.  Stable left IJ Port-A- Cath position.  Stable right pigtail catheter position.  Stable right IJ central line position.  Again noted mild interstitial edema without change in aeration. Stable basilar atelectasis or infiltrate.  IMPRESSION: Stable right pigtail catheter position.  Persistent mild congestion/edema.  No diagnostic pneumothorax.   Original Report Authenticated By: Natasha Mead, M.D.    Dg Chest Port 1 View  05/31/2012  *RADIOLOGY REPORT*  Clinical Data: Evaluate right effusion and chest tube  PORTABLE CHEST - 1 VIEW  Comparison: Portable chest x-ray of 05/30/2012  Findings: The lungs are poorly aerated and there is pulmonary vascular congestion present.  Opacity at the right lung base is most consistent with atelectasis and effusion.  Right chest tube, permanent pacemaker, right IJ central venous line, and Port-A-Cath remain.  Cardiomegaly is stable.  IMPRESSION: Slightly diminished aeration with little change in probable edema.   Original Report Authenticated By: Dwyane Dee, M.D.    Dg Chest Port 1 View  05/30/2012  *RADIOLOGY REPORT*  Clinical Data: Right effusion.  Respiratory distress.  PORTABLE CHEST - 1 VIEW  Comparison: 05/29/2012  Findings: Interval removal of endotracheal tube and NG tube.  Right chest pigtail catheter and left Port-A-Cath remain in place, unchanged.  No pneumothorax.  Bilateral airspace disease, right greater than left with small right effusion.  Stable cardiomegaly. No real change since prior study.  IMPRESSION: Interval extubation.  Otherwise no change.  No pneumothorax.   Original Report Authenticated By: Charlett Nose, M.D.    Dg Chest Port 1 View  05/29/2012  *RADIOLOGY REPORT*  Clinical Data: Pneumonia  PORTABLE CHEST - 1 VIEW  Comparison: Yesterday  Findings: Borderline cardiomegaly.  Bilateral pulmonary edema improved.  Right pleural effusion improved.  Right chest tube remains in place.  Right internal jugular vein central venous catheter and left internal jugular vein Port-A-Cath unchanged. Endotracheal and NG tubes stable.  No pneumothorax.  IMPRESSION: Improved edema.  Improved  right pleural effusion.   Original Report Authenticated By: Jolaine Click, M.D.    Dg Chest Port 1 View  05/28/2012  *RADIOLOGY REPORT*  Clinical Data: Check endotracheal tube.  Right effusion.  Chest tube.  PORTABLE CHEST - 1 VIEW  Comparison: 05/27/2012  Findings: The patient is rotated towards the right, limiting the study.  Shallow inspiration.  Right pleural effusion with atelectasis or consolidation in the right mid lung.  Right chest tube is in place.  Right central venous catheter, left central venous catheter, endotracheal tube, and enteric tubes are not changed in position. Perihilar infiltration on the left likely represents edema.  No pneumothorax.  Surgical clips in the axilla bilaterally.  Cardiac pacemaker.  IMPRESSION: No significant change since yesterday.  Appliances appear to be stable in position, allowing for patient rotation.  Persistent right pleural effusion and consolidation in the right lung. Perihilar infiltration or edema in the left lung.   Original Report Authenticated By: Burman Nieves, M.D.    Dg Chest Port 1 View  05/27/2012  *RADIOLOGY REPORT*  Clinical Data: 67 year old female pneumonia pleural effusion.  PORTABLE CHEST - 1 VIEW  Comparison: Chest CT 05/26/2012 and earlier.  Findings: Semi upright AP view at 0605 hours.  Pigtail right pleural drain now in place.  Interval decreased right pleural effusion and mild to moderately improved right lung ventilation.  Stable endotracheal tube, visualized enteric tube and left chest Port-A-Cath.  Stable right chest cardiac pacemaker.  Stable cardiac size and mediastinal contours.  Decreased vascular congestion.  No  confluent opacity in the left lung.  No pneumothorax identified.  IMPRESSION: 1.  Right pleural drain placed with decreased pleural effusion and mild to moderately improved right lung ventilation.  Residual fluid with no pneumothorax identified. 2.  Otherwise stable lines and tubes. 3.  Decreased pulmonary vascular  congestion.   Original Report Authenticated By: Erskine Speed, M.D.    Dg Chest Port 1 View  05/26/2012  *RADIOLOGY REPORT*  Clinical Data: Right pleural effusion.  PORTABLE CHEST - 1 VIEW  Comparison: 05/25/2012  Findings: Endotracheal tube, central venous catheter, and Port-A- Cath are in place.  Dual lead pacer in place.  There is increased pulmonary vascular congestion with slight left perihilar pulmonary edema.  Large loculated right pleural effusion has increased.  Most of the right lung is compressed.  IMPRESSION:  1.  New pulmonary vascular congestion on the left with new slight perihilar edema. 2.  Increased right effusion.   Original Report Authenticated By: Francene Boyers, M.D.    Dg Chest Port 1 View  05/25/2012  *RADIOLOGY REPORT*  Clinical Data: Endotracheal tube and central line placement  PORTABLE CHEST - 1 VIEW  Comparison: 05/24/2012; 05/18/2012; 05/16/2012  Findings:  Grossly unchanged enlarged cardiac silhouette and mediastinal contours.  Interval intubation with endotracheal tube overlying tracheal air column with tip regional to the level of the carina.  Interval placement of right jugular approach of the venous catheter with tip seen at least to the level of the mid SVC.  Otherwise, stable positioning of remaining support apparatus.  Interval increase in now moderate to large partially loculated right-sided pleural effusion and adjacent opacities within the right lung.  Pulmonary vasculature is indistinct.  Right axillary surgical clips.  Grossly unchanged bones.  IMPRESSION: 1.  Endotracheal tube overlies tracheal air column the tip regional to the carina.  Retraction approximately 2 cm is recommended. 2.  Right jugular approach central venous catheter tip projects over at least the mid SVC.  No pneumothorax. 3.  Interval increase in moderate to large likely loculated right- sided pleural effusion and adjacent opacities within the right lung, atelectasis versus infiltrate.  4.  Suspected  worsening pulmonary edema.  This was made a call report.   Original Report Authenticated By: Tacey Ruiz, MD    Ct Perc Pleural Drain W/indwell Cath W/img Guide  05/26/2012  *RADIOLOGY REPORT*  Indication: Increasing right-sided pleural effusion, inability to wean from ventilator  CT GUIDED RIGHT SIDED PLEURAL DRAINAGE CATHETER PLACEMENT  Comparison: Chest CT - 05/25/2012; chest radiograph - 05/26/2012; 05/25/2012; 05/24/2012; chest ultrasound - 05/25/2012  Medications: Fentanyl 50 mcg IV; Versed 2 mg IV  Total Moderate Sedation time: 24 minutes  Contrast: None  Complications: None immediate  Technique / Findings:  Informed written consent was obtained from the patient's family after a discussion of the risks, benefits and alternatives to treatment.  The patient was placed supine on the CT gantry and a pre procedural CT was performed re-demonstrating the known small to moderate-sized right-sided pleural effusion.  The procedure was planned.   A timeout was performed prior to the initiation of the procedure.  The right anterior chest was prepped and draped in the usual sterile fashion.   The overlying soft tissues were anesthetized with 1% lidocaine with epinephrine.  Appropriate trajectory was planned with the use of a 22 gauge spinal needle.  An 18 gauge trocar needle was advanced into the right pleural space a short Amplatz super stiff wire was coiled within the collection. Appropriate positioning was  confirmed with a limited CT scan.  The tract was serially dilated allowing placement of a 10 [place] all- purpose drainage catheter.  Appropriate positioning was confirmed with a limited postprocedural CT scan with the drainage catheter directed towards the right lung apex.  Approximately 400 ml of simple, serous fluid was aspirated from the right pleural space.  The tube was connected to a Pleur-vac and sutured in place.  A dressing was placed.  The patient tolerated the procedure well without immediate post  procedural complication.  Impression:  Successful CT guided placement of a 10 [place] all purpose drain catheter into the right pleural space with aspiration of 400 mL of serious, simple appearing pleural fluid.  Samples were sent to the laboratory as requested by the ordering clinical team.   Original Report Authenticated By: Tacey Ruiz, MD     Medications: I have reviewed the patient's current medications. Scheduled Meds: . albuterol  2.5 mg Nebulization BID  . amLODipine  10 mg Oral Daily  . antiseptic oral rinse  15 mL Mouth Rinse BID  . atorvastatin  10 mg Oral q1800  . budesonide  0.5 mg Nebulization BID  . calcium-vitamin D  1 tablet Oral Daily  . carvedilol  12.5 mg Oral Q12H  . chlorhexidine  15 mL Mouth Rinse BID  . citalopram  10 mg Oral Daily  . enoxaparin (LOVENOX) injection  120 mg Subcutaneous Q12H  . furosemide  20 mg Oral Daily  . insulin aspart  0-20 Units Subcutaneous TID WC  . ipratropium  0.5 mg Nebulization BID  . irbesartan  150 mg Oral BID  . metroNIDAZOLE  500 mg Oral Q8H  . multivitamin  5 mL Per Tube Daily  . olopatadine  1 drop Both Eyes BID  . pantoprazole  40 mg Oral Daily  . pregabalin  200 mg Oral BID  . sodium chloride  10-40 mL Intracatheter Q12H   Continuous Infusions:  PRN Meds:.acetaminophen (TYLENOL) oral liquid 160 mg/5 mL, albuterol, chlorpheniramine-HYDROcodone, ibuprofen, morphine injection, ondansetron (ZOFRAN) IV, oxyCODONE, sodium chloride  Assessment/Plan: Melissa Villa is a 67 year old woman with a history of breast cancer x 2, carcinoid of the right lower lobe, diastolic heart failure, chronic obstructive pulmonary disease, diabetes, and complete heart block requiring dual-chamber pacemaker who presents with a several week history of increasing shortness of breath and fatigue, and is beign treated for acute respiratory failure, a R pleural effusion, RLE DVT, and C. diff infection.   1. Bilateral pleural effusion: Pleural effusions first  noted 12/2011 and were very small. Slow progressive increase in effusions since that time, R>L. S/p right chest tube, placed in the ICU on 2/7. Due to decreased output, it was removed on 2/17 without complication. The big question is what is the cause of this effusion. Possible it is a malignant effusion related to her breast cancer. Peripheral smear and cytology show no malignant cells. Differential also includes tuberculous effusion. Quantiferon gold pending. She will require a repeat CXR in a few weeks as an outpatient. - F/u Quantiferon gold  2. Possible HCAP: Resolved. She was started on Vanc and Zosyn on admission to the ICU, which was changed to Ceftriaxone. While we are unsure if she actually had HCAP, but she completed a 7 day course of Rocephin on 2/13.   3. Diastolic CHF: Volume overload on admission, unable to determine the acuity of her volume overload, seems to have been worsening prior to admission despite an escalation of her diuretics. BLE edema present on  admission and pulm vascular congestion on CXR. ECHO performed on 05/25/12: EF 60 to 65%, grade 1 diastolic dysfx, PAS 104 mmHg. Diuresing with Lasix for a total of at least -8L. Unfortunately, her uop and daily weights have not been well recorded. Cr remains stable. She appears to be euvolemic. - Lasix 20 mg PO daily to remain fluid-neutral  - Continue to check I/O's, daily weights, daily creatinine   4. RLE DVT: Seen on BLE duplex on 2/7. She was started on a heparin drip, which was transitioned to Lovenox, which has shown improved long-term survival in cancer pts requiring long-term anticoagulation.  - Lovenox per Pharmacy   5. C. diff infection: Possibly caused by antibiotics for HCAP. Started on Flagyl for C.diff treatment 2/11. Plan to continue for 14 days.  - Cont Flagyl, day 8/14   6. R invasive ductal carcinoma: Being treated by Dr. [name]. The Herceptin was discontinued due to concerns for possible CHF. She is currently on  Fulvestrant, but is missing a dose while in the hospital. Dr. [name] would like the pt to schedule a f/u appt once she is out of the hospital. He states missing the dose of the Fulvestrant for a week will not negatively affect the patient.   7. AKI: Resolved.  Recent Labs Lab 06/01/12 0515 06/02/12 1445 06/04/12 0500 06/05/12 0620 06/06/12 0526  CREATININE 0.92 0.80 1.04 0.92 0.89    8. Hypokalemia: Resolved. Replacing K+ as needed.   Recent Labs Lab 06/01/12 0515 06/02/12 1445 06/04/12 0500 06/05/12 0620 06/06/12 0526  K 3.5 4.0 5.2* 4.1 4.0   9. Anemia: Stable. Hbg 8.8 today.   10. Acute encephalopathy: Resolved. Thought to be 2/2 hypoxia and hypercapnea on HD #2.   11. DM: On Metformin and Januvia at home. These were held on admission. She is on resistant SSI. CBGs have been slightly elevated this admission.  12. HTN: BP elevated but stable. On home amlodipine and carvedilol. Held her valsartan initially, but have restarted an ARB. BP still elevated. Increasing ARB freq to BID. Will restart home meds at discharge. - Continue Amlodipine and carvedilol. - Increase Irbesartan to BID  13. CAD: On Crestor at home. Started Atorvastin this admission.  14. Depressed Mood: Celexa was started this admission. Will continue and titrate up as needed. - Celexa 10mg  daily  15. Dispo: D/c home today with home health PT/RN/SW/nurses aid  The patient does have a current PCP [name], MD), therefore will be requiring OPC follow-up after discharge.   The patient does not have transportation limitations that hinder transportation to clinic appointments.   LOS: 13 days   Genelle Gather 06/06/2012, 11:37 AM

## 2012-06-06 NOTE — Discharge Planning (Signed)
Patient discharged home in stable condition. Verbalizes understanding of all discharge instructions, including home medications and follow up appointments. 

## 2012-06-06 NOTE — Progress Notes (Signed)
PT Cancellation Note  Patient Details Name: Melissa Villa MRN: [id number] DOB: [birth year]   Cancelled Treatment:    Reason Eval/Treat Not Completed: Other (comment) (patient in process of packing up for discharge)  Discussed any d/c needs and patient felt everything set up.   Halcyon Heck,CYNDI 06/06/2012, 3:48 PM

## 2012-06-06 NOTE — Progress Notes (Signed)
Internal Medicine Attending  Date: 06/06/2012  Patient name: Melissa Villa Medical record number: [id number] Date of birth: [birth year] Age: 67 y.o. Gender: female  I saw and evaluated the patient. I reviewed the resident's note by Dr. Sherrine Maples [name] agree with the resident's findings and plans as documented in her progress note.  [name] feels well this morning. She denies any shortness of breath while sitting although does become dyspneic with any movement. Her chest tube was removed yesterday without any complications. She's very much interested in going home and refuses to consider a skilled nursing facility. Home health services have been arranged. I agree with the housestaff's plan to discharge her home on her home medication regimen with management of her chronic medical conditions in the [facility]. We will be sure she has a followup appointment in the very near future.

## 2012-06-06 NOTE — Progress Notes (Signed)
ANTICOAGULATION CONSULT NOTE - Follow Up Consult  Pharmacy Consult for Lovenox Indication: Right Leg DVT  No Known Allergies  Patient Measurements: Height: 5' 4.5" (163.8 cm) Weight: 279 lb 12.8 oz (126.916 kg) IBW/kg (Calculated) : 55.85  Labs:  Recent Labs  06/04/12 0500 06/05/12 0620 06/06/12 0526  HGB 8.6* 8.8* 9.2*  HCT 31.0* 31.3* 32.5*  PLT 289 329 321  CREATININE 1.04 0.92 0.89   Estimated Creatinine Clearance: 85 ml/min (by C-G formula based on Cr of 0.89).  Assessment: 74 YOF with right leg DVT on Lovenox. Hgb is low but stable. Platelets are stable. No overt bleeding reported.   Goal of Therapy:  Monitor platelets by anticoagulation protocol: Yes   Plan:  Continue Lovenox 120mg  SQ q12h Monitor CBC at least q72h Will follow for long-term anticoagulation plans.  Link Snuffer, PharmD, BCPS Clinical Pharmacist [phone]  06/06/2012, 12:06 PM

## 2012-06-06 NOTE — Progress Notes (Signed)
Medical Student Daily Progress Note  Subjective: Pt reports no SOB or breathing difficulty since chest tube d/c.  She says that she woke up with a headache this AM and has leg weakness.  O2 sats were 98% on 2L [place] at the time of exam, and she self-transitioned to CPAP with 2L O2 while I was in the room.  She agrees with plan for discharge today.  Objective: Vital signs in last 24 hours: Filed Vitals:   06/05/12 1349 06/05/12 2050 06/05/12 2205 06/06/12 0623  BP: 145/54  149/54 170/74  Pulse: 88  90 87  Temp: 97.8 F (36.6 C)  98.4 F (36.9 C) 98.9 F (37.2 C)  TempSrc: Oral  Oral Oral  Resp: 18  18 18   Height:      Weight:    126.916 kg (279 lb 12.8 oz)  SpO2: 96% 95% 92% 92%   Weight change: -3lb since admission  Intake/Output Summary (Last 24 hours) at 06/06/12 0837 Last data filed at 06/06/12 0423  Gross per 24 hour  Intake    240 ml  Output   1200 ml  Net   -960 ml   Physical Exam: BP 170/74  Pulse 87  Temp(Src) 98.9 F (37.2 C) (Oral)  Resp 18  Ht 5' 4.5" (1.638 m)  Wt 126.916 kg (279 lb 12.8 oz)  BMI 47.3 kg/m2  SpO2 92% General appearance: alert, cooperative, no distress and obese Head: Normocephalic, without obvious abnormality, atraumatic Eyes: EOMI, PERRL Lungs: clear to auscultation bilaterally and no wheezing/crackles appreciated Breasts: skin darkening appreciated over right breast with moderate bleeding located surrounding probable dislodged follicle Heart: regular rate and rhythm, S1, S2 normal, no murmur, click, rub or gallop Abdomen: soft, non-tender; bowel sounds normal; no masses,  no organomegaly and obese Extremities: no peripheral edema appreciated Neuro: AAOx3. No focal deficits.  Lab Results: Results for orders placed during the hospital encounter of 05/24/12 (from the past 24 hour(s))  GLUCOSE, CAPILLARY     Status: Abnormal   Collection Time    06/05/12 12:34 PM      Result Value Range   Glucose-Capillary 243 (*) 70 - 99 mg/dL   Comment 1 Notify RN    GLUCOSE, CAPILLARY     Status: Abnormal   Collection Time    06/05/12  5:17 PM      Result Value Range   Glucose-Capillary 169 (*) 70 - 99 mg/dL   Comment 1 Notify RN    GLUCOSE, CAPILLARY     Status: Abnormal   Collection Time    06/05/12 10:03 PM      Result Value Range   Glucose-Capillary 169 (*) 70 - 99 mg/dL  BASIC METABOLIC PANEL     Status: Abnormal   Collection Time    06/06/12  5:26 AM      Result Value Range   Sodium 141  135 - 145 mEq/L   Potassium 4.0  3.5 - 5.1 mEq/L   Chloride 102  96 - 112 mEq/L   CO2 30  19 - 32 mEq/L   Glucose, Bld 194 (*) 70 - 99 mg/dL   BUN 6  6 - 23 mg/dL   Creatinine, Ser 1.61  0.50 - 1.10 mg/dL   Calcium 9.6  8.4 - 09.6 mg/dL   GFR calc non Af Amer 67 (*) >90 mL/min   GFR calc Af Amer 78 (*) >90 mL/min  CBC     Status: Abnormal   Collection Time    06/06/12  5:26  AM      Result Value Range   WBC 9.4  4.0 - 10.5 K/uL   RBC 4.56  3.87 - 5.11 MIL/uL   Hemoglobin 9.2 (*) 12.0 - 15.0 g/dL   HCT 16.1 (*) 09.6 - 04.5 %   MCV 71.3 (*) 78.0 - 100.0 fL   MCH 20.2 (*) 26.0 - 34.0 pg   MCHC 28.3 (*) 30.0 - 36.0 g/dL   RDW 40.9 (*) 81.1 - 91.4 %   Platelets 321  150 - 400 K/uL  GLUCOSE, CAPILLARY     Status: Abnormal   Collection Time    06/06/12  8:01 AM      Result Value Range   Glucose-Capillary 184 (*) 70 - 99 mg/dL   Comment 1 Notify RN     Micro Results: Recent Results (from the past 240 hour(s))  CLOSTRIDIUM DIFFICILE BY PCR     Status: Abnormal   Collection Time    05/30/12  2:44 PM      Result Value Range Status   C difficile by pcr POSITIVE (*) NEGATIVE Final   Comment: CRITICAL RESULT CALLED TO, READ BACK BY AND VERIFIED WITH:     HALLETTM RN 16:40 05/30/12 ([email or web address])   Studies/Results: Dg Chest 2 View  06/05/2012  *RADIOLOGY REPORT*  Clinical Data: 67 year old female shortness of breath.  Right chest tube.  CHEST - 2 VIEW  Comparison: 06/02/2012 and earlier.  Findings: AP and lateral views of the  chest.  Right pigtail pleural drain remains in place.  No pneumothorax identified.  Small volume residual right pleural effusion.  Residual right perihilar opacity. Left lung is stable and clear.  Stable cardiac size and mediastinal contours.  Visualized tracheal air column is within normal limits. Left chest Port-A-Cath and right chest cardiac pacemaker. Postoperative changes to the left axilla.  Stable visualized osseous structures.  IMPRESSION: 1.  Stable right pigtail pleural drain with small residual right pleural effusion. No pneumothorax. 2.  Continued right perihilar opacity.   Original Report Authenticated By: Erskine Speed, M.D.    Dg Chest Port 1 View  06/05/2012  *RADIOLOGY REPORT*  Clinical Data: Chest tube removal  PORTABLE CHEST - 1 VIEW  Comparison: 06/05/2012  Findings: Right sided chest tube has been removed.  No pneumothorax.  Small right pleural effusion and right lung infiltrate are unchanged.  Left lung remains clear  IMPRESSION: No pneumothorax following right chest tube removal.   Original Report Authenticated By: Janeece Riggers, M.D.    Medications: I have reviewed the patient's current medications. Scheduled Meds: . albuterol  2.5 mg Nebulization BID  . amLODipine  10 mg Oral Daily  . antiseptic oral rinse  15 mL Mouth Rinse BID  . atorvastatin  10 mg Oral q1800  . budesonide  0.5 mg Nebulization BID  . calcium-vitamin D  1 tablet Oral Daily  . carvedilol  12.5 mg Oral Q12H  . chlorhexidine  15 mL Mouth Rinse BID  . citalopram  10 mg Oral Daily  . enoxaparin (LOVENOX) injection  120 mg Subcutaneous Q12H  . furosemide  20 mg Oral Daily  . insulin aspart  0-20 Units Subcutaneous TID WC  . ipratropium  0.5 mg Nebulization BID  . irbesartan  150 mg Oral BID  . metroNIDAZOLE  500 mg Oral Q8H  . multivitamin  5 mL Per Tube Daily  . olopatadine  1 drop Both Eyes BID  . pantoprazole  40 mg Oral Daily  . pregabalin  200 mg Oral BID  .  sodium chloride  10-40 mL Intracatheter Q12H    Continuous Infusions:  PRN Meds:.acetaminophen (TYLENOL) oral liquid 160 mg/5 mL, albuterol, chlorpheniramine-HYDROcodone, ibuprofen, morphine injection, ondansetron (ZOFRAN) IV, oxyCODONE, sodium chloride  Assessment/Plan: Mrs. Knodel is a 67yo F with PMH pertinent for invasive ductal carcinoma of R breast, CHF, Fe def anemia, ?COPD, and OSA, who is primarily being treated for a R pleural effusion, RLE DVT, and C. diff infection.   1. BL (R>>L) pleural effusion: Chest tube removed yesterday.  Clinical sx are stable and consistent with baseline.  Oxygen requirement back to baseline of 2L [place].  Called lab yesterday re: quantiferon and found it to be inadequate for testing.  We drew another quantiferon yesterday afternoon, which the lab said they would expedite.  From a clinical standpoint, she is ready for discharge. If quantiferon result is not back today before discharge, it can be followed up as outpatient.  She will need close follow-up to assess for reaccumulation of pleural effusion.  2. ?COPD: Stable. Discharge on previous home regimen.  - Albuterol 2.5mg  BID  - Ipratropium 0.5mg  BID  - Budesonide 0.5mg  BID   3. dCHF: Euvolemic on exam, -6.8L balance and down 3lb since admission; will continue Lasix to maintain euvolemia at discharge.  - PO Lasix 20 daily   4. RLE DVT: Stable. Patient endorses ability to do Lovenox shots.  Will discharge at therapeutic Lovenox dose. - Lovenox 125mg  BID   5. C. diff infection: Stable. No episodes of diarrhea x 4 days.  - Continue 14 day course of Flagyl  6. Depressed mood: Stable. Patient has agreed with continuing Celexa on discharge. - Celexa 10mg  daily   7. ?PNA: Resolved. S/p 7 day course of ceftriaxone. Pt continues to be afebrile with a WBC of 9.4 this AM.   8. R invasive ductal carcinoma: NTD. Pt will schedule fulvestrant therapy on d/c.   9. AKI: Resolved.  - Avoid nephrotoxic drugs   10. Fluctuating hypo/hyperkalemia: Resolved. K at  4.0 this AM.   11. Anemia: Stable; Hgb at 9.2.   12. DM: Moderate control.  - Continue SSI   13. HTN: Will resume home regimen for blood pressure control.  BP have still been above goal, but will defer to Dr. Meredith Pel for continued outpatient management of BP. - Amlodipine 10mg  daily - Carvedilol 12.5mg  BID - Switch from irbesartan to home valsartan 160mg  BID on discharge  14. CAD: Stable.  - Atorvastatin 10mg  daily  15. FEN/GI: Stable.  - Regular diet  - Protonix 40mg  daily  16. Dispo: D/c today with PT home health, home health aide and social work on board.  Will f/u with Dr. Meredith Pel and Dr. [name].    LOS: 13 days   This is a Psychologist, occupational Note.  The care of the patient was discussed with Dr. Sherrine Maples and the assessment and plan formulated with their assistance.  Please see their attached note for official documentation of the daily encounter.  Wynona Meals Amy 06/06/2012, 8:37 AM

## 2012-06-07 ENCOUNTER — Ambulatory Visit (HOSPITAL_BASED_OUTPATIENT_CLINIC_OR_DEPARTMENT_OTHER): Payer: Medicare Other

## 2012-06-07 VITALS — BP 114/58 | HR 75 | Temp 97.8°F

## 2012-06-07 DIAGNOSIS — C50919 Malignant neoplasm of unspecified site of unspecified female breast: Secondary | ICD-10-CM

## 2012-06-07 DIAGNOSIS — Z5111 Encounter for antineoplastic chemotherapy: Secondary | ICD-10-CM

## 2012-06-07 MED ORDER — FULVESTRANT 250 MG/5ML IM SOLN
500.0000 mg | INTRAMUSCULAR | Status: DC
Start: 2012-06-07 — End: 2012-06-07
  Administered 2012-06-07: 500 mg via INTRAMUSCULAR
  Filled 2012-06-07: qty 10

## 2012-06-07 NOTE — Progress Notes (Signed)
Resident Co-sign Daily Note: I have seen the patient and reviewed the daily progress note by Jaye Beagle, MS-III and discussed the care of the patient with her.  See my separate note for documentation of my findings, assessment, and plans.   Assessment/Plan:  LOS: 13 days   Genelle Gather 06/07/2012, 3:47 PM

## 2012-06-09 ENCOUNTER — Encounter (HOSPITAL_COMMUNITY): Payer: Self-pay | Admitting: Emergency Medicine

## 2012-06-09 ENCOUNTER — Inpatient Hospital Stay (HOSPITAL_COMMUNITY)
Admission: EM | Admit: 2012-06-09 | Discharge: 2012-06-21 | DRG: 208 | Disposition: A | Discharge status: Skilled Nursing Facility | Payer: Medicare Other | Attending: Internal Medicine | Admitting: Internal Medicine

## 2012-06-09 ENCOUNTER — Other Ambulatory Visit: Payer: Self-pay

## 2012-06-09 ENCOUNTER — Emergency Department (HOSPITAL_COMMUNITY): Payer: Medicare Other

## 2012-06-09 ENCOUNTER — Telehealth: Payer: Self-pay | Admitting: *Deleted

## 2012-06-09 DIAGNOSIS — C787 Secondary malignant neoplasm of liver and intrahepatic bile duct: Secondary | ICD-10-CM | POA: Diagnosis present

## 2012-06-09 DIAGNOSIS — I82401 Acute embolism and thrombosis of unspecified deep veins of right lower extremity: Secondary | ICD-10-CM | POA: Diagnosis present

## 2012-06-09 DIAGNOSIS — J301 Allergic rhinitis due to pollen: Secondary | ICD-10-CM

## 2012-06-09 DIAGNOSIS — W19XXXA Unspecified fall, initial encounter: Secondary | ICD-10-CM

## 2012-06-09 DIAGNOSIS — R531 Weakness: Secondary | ICD-10-CM

## 2012-06-09 DIAGNOSIS — J189 Pneumonia, unspecified organism: Secondary | ICD-10-CM | POA: Diagnosis present

## 2012-06-09 DIAGNOSIS — J449 Chronic obstructive pulmonary disease, unspecified: Secondary | ICD-10-CM

## 2012-06-09 DIAGNOSIS — J9620 Acute and chronic respiratory failure, unspecified whether with hypoxia or hypercapnia: Secondary | ICD-10-CM | POA: Diagnosis present

## 2012-06-09 DIAGNOSIS — G9340 Encephalopathy, unspecified: Secondary | ICD-10-CM | POA: Diagnosis present

## 2012-06-09 DIAGNOSIS — J4489 Other specified chronic obstructive pulmonary disease: Secondary | ICD-10-CM | POA: Diagnosis present

## 2012-06-09 DIAGNOSIS — J90 Pleural effusion, not elsewhere classified: Secondary | ICD-10-CM

## 2012-06-09 DIAGNOSIS — I2789 Other specified pulmonary heart diseases: Secondary | ICD-10-CM | POA: Diagnosis present

## 2012-06-09 DIAGNOSIS — Z853 Personal history of malignant neoplasm of breast: Secondary | ICD-10-CM

## 2012-06-09 DIAGNOSIS — F329 Major depressive disorder, single episode, unspecified: Secondary | ICD-10-CM | POA: Diagnosis present

## 2012-06-09 DIAGNOSIS — N179 Acute kidney failure, unspecified: Secondary | ICD-10-CM | POA: Diagnosis present

## 2012-06-09 DIAGNOSIS — A0472 Enterocolitis due to Clostridium difficile, not specified as recurrent: Secondary | ICD-10-CM | POA: Diagnosis present

## 2012-06-09 DIAGNOSIS — Z9981 Dependence on supplemental oxygen: Secondary | ICD-10-CM

## 2012-06-09 DIAGNOSIS — E119 Type 2 diabetes mellitus without complications: Secondary | ICD-10-CM | POA: Diagnosis present

## 2012-06-09 DIAGNOSIS — Z950 Presence of cardiac pacemaker: Secondary | ICD-10-CM

## 2012-06-09 DIAGNOSIS — R4182 Altered mental status, unspecified: Secondary | ICD-10-CM | POA: Diagnosis present

## 2012-06-09 DIAGNOSIS — R51 Headache: Secondary | ICD-10-CM

## 2012-06-09 DIAGNOSIS — C3490 Malignant neoplasm of unspecified part of unspecified bronchus or lung: Secondary | ICD-10-CM

## 2012-06-09 DIAGNOSIS — R0603 Acute respiratory distress: Secondary | ICD-10-CM | POA: Diagnosis present

## 2012-06-09 DIAGNOSIS — D72829 Elevated white blood cell count, unspecified: Secondary | ICD-10-CM

## 2012-06-09 DIAGNOSIS — I82509 Chronic embolism and thrombosis of unspecified deep veins of unspecified lower extremity: Secondary | ICD-10-CM | POA: Diagnosis present

## 2012-06-09 DIAGNOSIS — Z9181 History of falling: Secondary | ICD-10-CM

## 2012-06-09 DIAGNOSIS — Z7901 Long term (current) use of anticoagulants: Secondary | ICD-10-CM

## 2012-06-09 DIAGNOSIS — I509 Heart failure, unspecified: Secondary | ICD-10-CM | POA: Diagnosis present

## 2012-06-09 DIAGNOSIS — J9600 Acute respiratory failure, unspecified whether with hypoxia or hypercapnia: Secondary | ICD-10-CM

## 2012-06-09 DIAGNOSIS — Z87891 Personal history of nicotine dependence: Secondary | ICD-10-CM

## 2012-06-09 DIAGNOSIS — G4733 Obstructive sleep apnea (adult) (pediatric): Secondary | ICD-10-CM | POA: Diagnosis present

## 2012-06-09 DIAGNOSIS — R52 Pain, unspecified: Secondary | ICD-10-CM

## 2012-06-09 DIAGNOSIS — F3289 Other specified depressive episodes: Secondary | ICD-10-CM | POA: Diagnosis present

## 2012-06-09 DIAGNOSIS — C50919 Malignant neoplasm of unspecified site of unspecified female breast: Secondary | ICD-10-CM | POA: Diagnosis present

## 2012-06-09 DIAGNOSIS — D638 Anemia in other chronic diseases classified elsewhere: Secondary | ICD-10-CM | POA: Diagnosis present

## 2012-06-09 DIAGNOSIS — I10 Essential (primary) hypertension: Secondary | ICD-10-CM | POA: Diagnosis present

## 2012-06-09 DIAGNOSIS — N390 Urinary tract infection, site not specified: Secondary | ICD-10-CM | POA: Diagnosis present

## 2012-06-09 DIAGNOSIS — I5032 Chronic diastolic (congestive) heart failure: Secondary | ICD-10-CM | POA: Diagnosis present

## 2012-06-09 LAB — URINALYSIS, ROUTINE W REFLEX MICROSCOPIC
Glucose, UA: NEGATIVE mg/dL
Hgb urine dipstick: NEGATIVE
Ketones, ur: 15 mg/dL — AB
Nitrite: POSITIVE — AB
Protein, ur: 100 mg/dL — AB
Specific Gravity, Urine: 1.03 (ref 1.005–1.030)
Urobilinogen, UA: 1 mg/dL (ref 0.0–1.0)
pH: 5 (ref 5.0–8.0)

## 2012-06-09 LAB — POCT I-STAT 3, ART BLOOD GAS (G3+)
Acid-base deficit: 1 mmol/L (ref 0.0–2.0)
Acid-base deficit: 3 mmol/L — ABNORMAL HIGH (ref 0.0–2.0)
Acid-base deficit: 3 mmol/L — ABNORMAL HIGH (ref 0.0–2.0)
Bicarbonate: 26.2 meq/L — ABNORMAL HIGH (ref 20.0–24.0)
Bicarbonate: 24.9 mEq/L — ABNORMAL HIGH (ref 20.0–24.0)
Bicarbonate: 25.2 mEq/L — ABNORMAL HIGH (ref 20.0–24.0)
O2 Saturation: 84 %
O2 Saturation: 57 %
Patient temperature: 98.7
Patient temperature: 98.7
TCO2: 28 mmol/L (ref 0–100)
TCO2: 27 mmol/L (ref 0–100)
TCO2: 27 mmol/L (ref 0–100)
pCO2 arterial: 53.2 mmHg — ABNORMAL HIGH (ref 35.0–45.0)
pH, Arterial: 7.302 — ABNORMAL LOW (ref 7.350–7.450)
pO2, Arterial: 54 mmHg — ABNORMAL LOW (ref 80.0–100.0)
pO2, Arterial: 82 mmHg (ref 80.0–100.0)

## 2012-06-09 LAB — BLOOD GAS, ARTERIAL
Bicarbonate: 23.5 mEq/L (ref 20.0–24.0)
Inspiratory PAP: 15
O2 Saturation: 99.2 %
PEEP: 5 cmH2O
Patient temperature: 98.6
Pressure support: 10 cmH2O
RATE: 15 resp/min
TCO2: 25.6 mmol/L (ref 0–100)
pH, Arterial: 7.166 — CL (ref 7.350–7.450)

## 2012-06-09 LAB — CBC WITH DIFFERENTIAL/PLATELET
Basophils Absolute: 0 10*3/uL (ref 0.0–0.1)
Eosinophils Relative: 1 % (ref 0–5)
Lymphocytes Relative: 20 % (ref 12–46)
Lymphs Abs: 2.9 10*3/uL (ref 0.7–4.0)
Monocytes Relative: 12 % (ref 3–12)
Neutrophils Relative %: 67 % (ref 43–77)
Platelets: 446 10*3/uL — ABNORMAL HIGH (ref 150–400)
RBC: 4.37 MIL/uL (ref 3.87–5.11)
RDW: 25.2 % — ABNORMAL HIGH (ref 11.5–15.5)
WBC: 14.7 10*3/uL — ABNORMAL HIGH (ref 4.0–10.5)

## 2012-06-09 LAB — RAPID URINE DRUG SCREEN, HOSP PERFORMED
Opiates: NOT DETECTED
Tetrahydrocannabinol: NOT DETECTED

## 2012-06-09 LAB — URINE MICROSCOPIC-ADD ON

## 2012-06-09 LAB — ETHANOL: Alcohol, Ethyl (B): <11 mg/dL (ref 0–11)

## 2012-06-09 LAB — COMPREHENSIVE METABOLIC PANEL
Alkaline Phosphatase: 54 U/L (ref 39–117)
BUN: 28 mg/dL — ABNORMAL HIGH (ref 6–23)
Chloride: 103 mEq/L (ref 96–112)
Creatinine, Ser: 2.13 mg/dL — ABNORMAL HIGH (ref 0.50–1.10)
GFR calc Af Amer: 27 mL/min — ABNORMAL LOW (ref >90)
GFR calc non Af Amer: 23 mL/min — ABNORMAL LOW (ref >90)
Glucose, Bld: 102 mg/dL — ABNORMAL HIGH (ref 70–99)
Potassium: 4.4 mEq/L (ref 3.5–5.1)
Total Bilirubin: <0.1 mg/dL — ABNORMAL LOW (ref 0.3–1.2)

## 2012-06-09 LAB — GLUCOSE, CAPILLARY
Glucose-Capillary: 104 mg/dL — ABNORMAL HIGH (ref 70–99)
Glucose-Capillary: 104 mg/dL — ABNORMAL HIGH (ref 70–99)
Glucose-Capillary: 135 mg/dL — ABNORMAL HIGH (ref 70–99)

## 2012-06-09 LAB — PRO B NATRIURETIC PEPTIDE: Pro B Natriuretic peptide (BNP): 1443 pg/mL — ABNORMAL HIGH (ref 0–125)

## 2012-06-09 LAB — MRSA PCR SCREENING: MRSA by PCR: POSITIVE — AB

## 2012-06-09 LAB — LACTIC ACID, PLASMA: Lactic Acid, Venous: 1 mmol/L (ref 0.5–2.2)

## 2012-06-09 MED ORDER — PANTOPRAZOLE SODIUM 40 MG IV SOLR
40.0000 mg | Freq: Every day | INTRAVENOUS | Status: DC
  Administered 2012-06-10 – 2012-06-11 (×2): 40 mg via INTRAVENOUS
  Filled 2012-06-09 (×2): qty 40

## 2012-06-09 MED ORDER — SODIUM CHLORIDE 0.9 % IJ SOLN
3.0000 mL | Freq: Two times a day (BID) | INTRAMUSCULAR | Status: DC
  Administered 2012-06-09 – 2012-06-20 (×9): 3 mL via INTRAVENOUS

## 2012-06-09 MED ORDER — ALBUTEROL SULFATE (5 MG/ML) 0.5% IN NEBU
INHALATION_SOLUTION | RESPIRATORY_TRACT | Status: AC
  Filled 2012-06-09: qty 1

## 2012-06-09 MED ORDER — ETOMIDATE 2 MG/ML IV SOLN
20.0000 mg | Freq: Once | INTRAVENOUS | Status: AC
  Administered 2012-06-09: 20 mg via INTRAVENOUS

## 2012-06-09 MED ORDER — ENOXAPARIN SODIUM 120 MG/0.8ML ~~LOC~~ SOLN
120.0000 mg | Freq: Two times a day (BID) | SUBCUTANEOUS | Status: DC
  Filled 2012-06-09: qty 0.8

## 2012-06-09 MED ORDER — SODIUM CHLORIDE 0.9 % IV SOLN
25.0000 ug/h | INTRAVENOUS | Status: DC
  Administered 2012-06-10: 25 ug/h via INTRAVENOUS
  Filled 2012-06-09: qty 50

## 2012-06-09 MED ORDER — NALOXONE HCL 0.4 MG/ML IJ SOLN
INTRAMUSCULAR | Status: AC
  Filled 2012-06-09: qty 1

## 2012-06-09 MED ORDER — ALBUTEROL SULFATE (5 MG/ML) 0.5% IN NEBU
5.0000 mg | INHALATION_SOLUTION | Freq: Once | RESPIRATORY_TRACT | Status: AC
  Administered 2012-06-09: 5 mg via RESPIRATORY_TRACT

## 2012-06-09 MED ORDER — CHLORHEXIDINE GLUCONATE 0.12 % MT SOLN
15.0000 mL | Freq: Two times a day (BID) | OROMUCOSAL | Status: DC
  Filled 2012-06-09: qty 15

## 2012-06-09 MED ORDER — FENTANYL BOLUS VIA INFUSION
25.0000 ug | Freq: Four times a day (QID) | INTRAVENOUS | Status: DC | PRN
  Filled 2012-06-09: qty 100

## 2012-06-09 MED ORDER — NALOXONE HCL 0.4 MG/ML IJ SOLN
0.4000 mg | Freq: Once | INTRAMUSCULAR | Status: AC
  Administered 2012-06-09: 0.4 mg via INTRAVENOUS

## 2012-06-09 MED ORDER — ACETAMINOPHEN 325 MG PO TABS
650.0000 mg | ORAL_TABLET | Freq: Four times a day (QID) | ORAL | Status: DC | PRN
  Filled 2012-06-09: qty 2

## 2012-06-09 MED ORDER — IPRATROPIUM BROMIDE 0.02 % IN SOLN
RESPIRATORY_TRACT | Status: AC
  Filled 2012-06-09: qty 2.5

## 2012-06-09 MED ORDER — MIDAZOLAM HCL 2 MG/2ML IJ SOLN
4.0000 mg | Freq: Once | INTRAMUSCULAR | Status: AC
  Administered 2012-06-09: 4 mg via INTRAVENOUS

## 2012-06-09 MED ORDER — ONDANSETRON HCL 4 MG/2ML IJ SOLN: 4.0000 mg | Freq: Four times a day (QID) | INTRAMUSCULAR | Status: DC | PRN

## 2012-06-09 MED ORDER — GI COCKTAIL ~~LOC~~: 30.0000 mL | Freq: Once | ORAL | Status: DC

## 2012-06-09 MED ORDER — ASPIRIN EC 325 MG PO TBEC: 325.0000 mg | DELAYED_RELEASE_TABLET | Freq: Once | ORAL | Status: DC

## 2012-06-09 MED ORDER — SODIUM CHLORIDE 0.9 % IV SOLN
2500.0000 mg | Freq: Once | INTRAVENOUS | Status: DC
  Administered 2012-06-09: 2500 mg via INTRAVENOUS
  Filled 2012-06-09: qty 2500

## 2012-06-09 MED ORDER — BIOTENE DRY MOUTH MT LIQD
15.0000 mL | Freq: Four times a day (QID) | OROMUCOSAL | Status: DC
  Administered 2012-06-10 (×2): 15 mL via OROMUCOSAL

## 2012-06-09 MED ORDER — SODIUM CHLORIDE 0.9 % IV SOLN
INTRAVENOUS | Status: DC
  Administered 2012-06-09 – 2012-06-11 (×3): via INTRAVENOUS

## 2012-06-09 MED ORDER — PROPOFOL 10 MG/ML IV EMUL
5.0000 ug/kg/min | INTRAVENOUS | Status: DC
  Administered 2012-06-10: 5 ug/kg/min via INTRAVENOUS
  Filled 2012-06-09: qty 100

## 2012-06-09 MED ORDER — IPRATROPIUM BROMIDE 0.02 % IN SOLN: 0.5000 mg | RESPIRATORY_TRACT | Status: DC | PRN

## 2012-06-09 MED ORDER — ONDANSETRON HCL 4 MG PO TABS: 4.0000 mg | ORAL_TABLET | Freq: Four times a day (QID) | ORAL | Status: DC | PRN

## 2012-06-09 MED ORDER — SUCCINYLCHOLINE CHLORIDE 20 MG/ML IJ SOLN
100.0000 mg | Freq: Once | INTRAMUSCULAR | Status: AC
  Administered 2012-06-09: 100 mg via INTRAVENOUS

## 2012-06-09 MED ORDER — ALBUTEROL SULFATE (5 MG/ML) 0.5% IN NEBU: 2.5000 mg | INHALATION_SOLUTION | RESPIRATORY_TRACT | Status: DC | PRN

## 2012-06-09 MED ORDER — PIPERACILLIN-TAZOBACTAM 3.375 G IVPB
3.3750 g | Freq: Once | INTRAVENOUS | Status: DC
  Administered 2012-06-09: 3.375 g via INTRAVENOUS
  Filled 2012-06-09: qty 50

## 2012-06-09 MED ORDER — METRONIDAZOLE IN NACL 5-0.79 MG/ML-% IV SOLN
500.0000 mg | Freq: Three times a day (TID) | INTRAVENOUS | Status: DC
  Administered 2012-06-09 – 2012-06-12 (×8): 500 mg via INTRAVENOUS
  Filled 2012-06-09 (×10): qty 100

## 2012-06-09 MED ORDER — VANCOMYCIN HCL 10 G IV SOLR: 1750.0000 mg | INTRAVENOUS | Status: DC

## 2012-06-09 MED ORDER — ACETAMINOPHEN 650 MG RE SUPP: 650.0000 mg | Freq: Four times a day (QID) | RECTAL | Status: DC | PRN

## 2012-06-09 MED ORDER — IPRATROPIUM BROMIDE 0.02 % IN SOLN
0.5000 mg | Freq: Once | RESPIRATORY_TRACT | Status: AC
  Administered 2012-06-09: 0.5 mg via RESPIRATORY_TRACT

## 2012-06-09 MED ORDER — LEVOFLOXACIN IN D5W 750 MG/150ML IV SOLN
750.0000 mg | Freq: Once | INTRAVENOUS | Status: DC
  Filled 2012-06-09: qty 150

## 2012-06-09 MED ORDER — MIDAZOLAM HCL 2 MG/2ML IJ SOLN
INTRAMUSCULAR | Status: AC
  Filled 2012-06-09: qty 4

## 2012-06-09 NOTE — ED Notes (Signed)
C-collar placed on patient per Patria Mane, MD request on arrival.

## 2012-06-09 NOTE — Consult Note (Signed)
PULMONARY  / CRITICAL CARE MEDICINE  Name: Melissa Villa MRN: [id number] DOB: [birth year]    ADMISSION DATE:  06/09/2012 CONSULTATION DATE:  06/09/12   REFERRING MD :  IM TS  CHIEF COMPLAINT:  R Effusion, Resp Failure  BRIEF PATIENT DESCRIPTION: 67 y/o F with PMH of invasive ductal breast cancer, carcinoid of RLL, diastolic heart failure, chronic obstructive pulmonary disease, diabetes, and complete heart block requiring dual-chamber pacemaker who presented to [facility] on 2/21 after multiple falls at home, increasing shortness of breath. Found to have worsening of R pleural effusion.  Placed on BiPAP for support. PCCM called for consult.    SIGNIFICANT EVENTS / STUDIES:  2/5-2/17 - admit for acute on chronic resp. Failure possible HCAP, right leg DVT, C.diff, AKI, and CT guided drainage of pleural effusion.  .......................................................................... 2/21 - Admit with resp failure, worsening effusion, bleeding from chest tube site   LINES / TUBES:   CULTURES:   ANTIBIOTICS: Vanco 2/21>>>2/21 Zosyn 2/21>>>2/21 Flagyl (hx of C-DifF)2/21 >>>    HISTORY OF PRESENT ILLNESS:  67 y/o F with PMH of chronic respiratory failure, chronic diastolic CHF, h/o right invasive ductal breast cancer, carcinoid RLL , 2L O2 dependent COPD, HTN, DM, OSA, pleural effusion s/p chest tube in 05/2012, right DVT (05/2012) on full dose lovenox, C. Diff (05/2012).  Presented to [facility] ER on 2/21 after multiple unwitnessed falls at home. Was found by family, difficult to arouse. She was recently d/c'ed from the hospital (2/5-2/17) for acute on chronic resp. Failure possible HCAP, right leg DVT, C.diff, AKI, and CT guided drainage of pleural effusion. Has had multiple admissions since 02/2012.  ER work up notes neg CT Head, stable H/H.  CXR concerning for re-accumulation of pleural fluid with loculated component.  Placed on BiPap in ER.  Found to have acute renal failure, bleeding from R anterior  old chest tube site.       PAST MEDICAL HISTORY :  Past Medical History  Diagnosis Date  . CHF (congestive heart failure)      2-D echo 02/19/2009 showed left ventricle cavity size normal; systolic function normal, estimated ejection fraction 55%; wall motion normal; no regional wall motion abnormalities; PA peak pressure 47mm Hg.  [name] COPD (chronic obstructive pulmonary disease)     Pulmonary function tests on 08/05/2010 showed mixed obstructive and restrictive lung disease with no significant response to bronchodilator, and decreased diffusion capacity that corrects to normal range when adjusted for the inhaled alveolar volume.  . Degenerative joint disease   . AV block, complete     S/P dual-chamber for symptomatic episodes of bradycardia and complete heart block.  . Pacemaker     S/P dual-chamber for symptomatic episodes of bradycardia and complete heart block.  . Adenomatous polyp of colon     Tubular adenomas X 2 found 06/1999; negative screening colonoscopy 02/13/2004 by Dr. [name].  . Hypercholesteremia   . Obstructive sleep apnea     Baseline diagnostic nocturnal polysomnogram on July 27, 2005 showed severe obstructive sleep apnea/hypopnea syndrome, with AHI 153.2 per hour.  Last nocturnal polysomnogram done for CPAP titration was 05/01/2009; CPAP was titrated to 13 CWP, AHI 1.8 per hour using a large Respironics FitLife Full Face Mask with heated humidifier.  . Peripheral autonomic neuropathy due to diabetes mellitus   . 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 [name]  . Constipation   . Hand pain   . Palpitation   .  Rib pain     right sided   . DM type 2 (diabetes mellitus, type 2)   . Skin rash   . Urinary incontinence   . Proliferative diabetic retinopathy(362.02)     S/P panretinal photocoagulation by Dr. [name]  . Vitreous hemorrhage     With tractional detachment, left eye; S/P posterior vitrectomy with membrane peel  by Dr. [name] 04/06/2005  . Dyspnea on exertion     Cardiac cath 06/17/2005 by Dr. [name] showed LVEF 50-55%, 30% proximal LAD stenosis, small diagonals.   . Shingles rash 03/25/11  . 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 Followed by Dr. [name].  . Breast cancer, stage 3 102/13    right hx lumpectomy 1994, bx today=invasive ductal ca  . Allergy     thinitis  . Asthma   . Bronchitis     hx  . Cataract     b/l surgery  . DM hyperosmolarity type II   . Hypertension   . TIA (transient ischemic attack)   . Anemia   . DJD (degenerative joint disease)   . DM neuropathies   . Renal insufficiency    Past Surgical History  Procedure Laterality Date  . Mastectomy partial / lumpectomy w/ axillary lymphadenectomy  1992, 1995    S/P left lumpectomy and axillary lymph node dissection December 1992 for a T1 N0 medullary breast cancer, no lymph node involvement, treated with tamoxifen for 2 years; S/P right lumpectomy and axillary lymph node dissection August 1995 for a T1 N0 microinvasive breast cancer, treated with Arimidex for 5 years.  Patient is followed by Dr. [name] at the cancer center.  . Lung lobectomy  06/13/2001    S/P right lower lobe lobectomy 06/13/2001 by Dr. [name]  . Pacemaker insertion  04/20/2001  . Mastectomy partial / lumpectomy    . Lobectomy    . Pacemaker insertion    . Cholecystectomy    . Tonsillectomy    . Port  a  cath     Prior to Admission medications   Medication Sig Start Date End Date Taking? Authorizing Provider  albuterol (PROAIR HFA) 108 (90 BASE) MCG/ACT inhaler Inhale 2 puffs into the lungs 4  (four) times daily as needed for wheezing or shortness of breath. 09/14/11   [name], MD  albuterol (PROVENTIL) (2.5 MG/3ML) 0.083% nebulizer solution Take 3 mLs (2.5 mg total) by nebulization every 6 (six) hours as needed for wheezing or shortness of breath. 04/05/12 04/05/13  Dorothea Ogle, MD  amLODipine (NORVASC) 10 MG tablet Take 10 mg by mouth daily before breakfast.    Historical Provider, MD  Bepotastine Besilate (BEPREVE) 1.5 % SOLN Place 1 drop into both eyes 2 (two) times daily.    Historical Provider, MD  budesonide (PULMICORT) 0.5 MG/2ML nebulizer solution Take 2 mLs (0.5 mg total) by nebulization 2 (two) times daily. 04/05/12   Dorothea Ogle, MD  calcium-vitamin D (OSCAL WITH D) 250-125 MG-UNIT per tablet Take 1 tablet by mouth daily.    Historical Provider, MD  carvedilol (COREG) 12.5 MG tablet Take 2 tablets (25 mg total) by mouth  2 (two) times daily. 01/17/12   [name], MD  cetirizine (ZYRTEC) 10 MG tablet Take 10 mg by mouth daily.    Historical Provider, MD  citalopram (CELEXA) 10 MG tablet Take 1 tablet (10 mg total) by mouth daily. 06/06/12   Genelle Gather, MD  Dexlansoprazole (DEXILANT) 60 MG capsule Take 60 mg by mouth every morning. Take 15 minutes before breakfast.    Historical Provider, MD  enoxaparin (LOVENOX) 120 MG/0.8ML injection Inject 0.8 mLs (120 mg total) into the skin every 12 (twelve) hours. 06/06/12   Genelle Gather, MD  Fluticasone-Salmeterol (ADVAIR) 250-50 MCG/DOSE AEPB Inhale 1 puff into the lungs every 12 (twelve) hours.    Historical Provider, MD  furosemide (LASIX) 20 MG tablet Take 1 tablet (20 mg total) by mouth daily. 06/06/12   Genelle Gather, MD  insulin glargine (LANTUS) 100 UNIT/ML injection Inject 42 Units into the skin at bedtime.    Historical Provider, MD  IRON PO Take 1 tablet by mouth daily.    Historical Provider, MD  lidocaine-prilocaine (EMLA) cream Apply 1 application topically once. Before procedure    Historical  Provider, MD  metFORMIN (GLUCOPHAGE) 850 MG tablet Take 850 mg by mouth 3 (three) times daily.    Historical Provider, MD  metroNIDAZOLE (FLAGYL) 500 MG tablet Take 1 tablet (500 mg total) by mouth every 8 (eight) hours. 06/06/12   Genelle Gather, MD  mometasone (NASONEX) 50 MCG/ACT nasal spray Place 2 sprays into the nose daily. scheduled    Historical Provider, MD  Multiple Vitamin (MULTIVITAMIN WITH MINERALS) TABS Take 1 tablet by mouth daily.    Historical Provider, MD  oxyCODONE (OXY IR/ROXICODONE) 5 MG immediate release tablet Take 1-2 tablets (5-10 mg total) by mouth every 6 (six) hours as needed for pain. 05/18/12   Lorretta Harp, MD  pregabalin (LYRICA) 200 MG capsule Take 1 capsule (200 mg total) by mouth 2 (two) times daily. 05/18/12   Ozzie Hoyle Draper, DO  rosuvastatin (CRESTOR) 5 MG tablet Take 10 mg by mouth daily.    Historical Provider, MD  sitaGLIPtin (JANUVIA) 100 MG tablet Take 100 mg by mouth daily.    Historical Provider, MD  tiotropium (SPIRIVA) 18 MCG inhalation capsule Place 1 capsule (18 mcg total) into inhaler and inhale daily. 08/16/11   [name], MD  traMADol (ULTRAM) 50 MG tablet Take 50 mg by mouth every 6 (six) hours as needed. For pain    Historical Provider, MD  valsartan (DIOVAN) 80 MG tablet Take 160 mg by mouth 2 (two) times daily.    Historical Provider, MD   No Known Allergies  FAMILY HISTORY:  Family History  Problem Relation Age of Onset  . Breast cancer Mother 36    Deceased 09-18-2002  . Diabetes type II Mother   . Heart attack Brother   . Cervical cancer Neg Hx   . Colon cancer Neg Hx   . Breast cancer Mother   . Diabetes Mellitus II Mother   . Hypertension Brother   . Coronary artery disease Brother   . Stroke Brother   . Diabetes type II Brother   . Hypertension Sister    SOCIAL HISTORY:  reports that she quit smoking about 11 years ago. Her smoking use included Cigarettes. She has a 15 pack-year smoking history. She has never used smokeless  tobacco. She reports that she does not drink alcohol or use illicit drugs.  REVIEW OF SYSTEMS:   Unable to complete as  pt is on BiPAP   SUBJECTIVE: Son reports multiple falls at home.   VITAL SIGNS: Temp:  [97.8 F (36.6 C)-98.3 F (36.8 C)] 97.8 F (36.6 C) (02/21 1536) Pulse Rate:  [84-90] 86 (02/21 1615) Resp:  [13-24] 19 (02/21 1615) BP: (100-121)/(46-61) 121/46 mmHg (02/21 1615) SpO2:  [88 %-100 %] 100 % (02/21 1724)  PHYSICAL EXAMINATION: General:  Morbidly obese in NAd Neuro:  Arouses, lethargy HEENT:  BiPAP mask in place, mm pink/dry, short neck Cardiovascular:  s1s2 rrr, no m/r/g Lungs:  resp's even/non-labored, diminished on L Abdomen:  Obese/soft, bsx4 active Musculoskeletal:  No acute deformities Skin:  Warm/dry, R breast with old small incision site from previous chest tube oozing blood, changes c/w radiation   Recent Labs Lab 06/05/12 0620 06/06/12 0526 06/09/12 1325  NA 138 141 139  K 4.1 4.0 4.4  CL 101 102 103  CO2 30 30 26   BUN 6 6 28*  CREATININE 0.92 0.89 2.13*  GLUCOSE 159* 194* 102*    Recent Labs Lab 06/05/12 0620 06/06/12 0526 06/09/12 1325  HGB 8.8* 9.2* 8.7*  HCT 31.3* 32.5* 31.3*  WBC 8.3 9.4 14.7*  PLT 329 321 446*   Dg Chest 2 View  06/09/2012  *RADIOLOGY REPORT*  Clinical Data: Altered mental status.  CHEST - 2 VIEW  Comparison: One-view chest 06/05/2012.  Findings: The Port-A-Cath and pacing wires are stable.  The right pleural effusion is reaccumulating following removal of the chest tube.  There appears to be loculated component.  Right basilar airspace disease is present.  While this may represent atelectasis, infection is not excluded.  Mild pulmonary vascular congestion and cardiac enlargement are stable.  IMPRESSION:  1.  Reaccumulating right pleural effusion.  There may be a loculated component. 2.  Worsening right lower lobe airspace disease.  Infection is not excluded. 3.  Stable pulmonary vascular congestion.   Original  Report Authenticated By: Marin Roberts, M.D.    Ct Head Wo Contrast  06/09/2012  *RADIOLOGY REPORT*  Clinical Data:  Altered mental status  CT HEAD WITHOUT CONTRAST CT CERVICAL SPINE WITHOUT CONTRAST  Technique:  Multidetector CT imaging of the head and cervical spine was performed following the standard protocol without intravenous contrast.  Multiplanar CT image reconstructions of the cervical spine were also generated.  Comparison:  03/28/2012  CT HEAD  Findings: No skull fracture is noted.  Paranasal sinuses and mastoid air cells are unremarkable.  Mild cerebral atrophy is stable.  No intracranial hemorrhage, mass effect or midline shift.  No acute infarction.  No mass lesion is noted on this unenhanced scan.  IMPRESSION: No acute intracranial abnormality.  Mild cerebral atrophy.  CT CERVICAL SPINE  Findings: Axial images of the cervical spine shows no acute fracture or subluxation. Computer processed images shows no acute fracture or subluxation.  Mild degenerative changes C1-C2 articulation.  Mild anterior spurring lower endplate of the C4-C5 and C6 vertebral body.  Moderate anterior spurring lower endplate of the C7 vertebral body.  Mild disc space flattening at C5-C6 level.  There is sclerosis  and mixed lytic appearance of the C6 vertebral body.  This is highly suspicious for metastatic disease. Further evaluation with MRI is recommended.  Images of the lung apices shows partially loculated right pleural effusion.  Right apical scarring is noted.  No diagnostic pneumothorax.  IMPRESSION:  1.  No acute fracture or subluxation.  Degenerative changes as described above. 2.  Mixed lytic sclerotic appearance of the C6 vertebral body. This is highly  suspicious for metastatic disease.  Further evaluation with MRI is recommended. 3.  Partially visualized loculated right pleural effusion.   Original Report Authenticated By: Natasha Mead, M.D.    Ct Cervical Spine Wo Contrast  06/09/2012  *RADIOLOGY REPORT*   Clinical Data:  Altered mental status  CT HEAD WITHOUT CONTRAST CT CERVICAL SPINE WITHOUT CONTRAST  Technique:  Multidetector CT imaging of the head and cervical spine was performed following the standard protocol without intravenous contrast.  Multiplanar CT image reconstructions of the cervical spine were also generated.  Comparison:  03/28/2012  CT HEAD  Findings: No skull fracture is noted.  Paranasal sinuses and mastoid air cells are unremarkable.  Mild cerebral atrophy is stable.  No intracranial hemorrhage, mass effect or midline shift.  No acute infarction.  No mass lesion is noted on this unenhanced scan.  IMPRESSION: No acute intracranial abnormality.  Mild cerebral atrophy.  CT CERVICAL SPINE  Findings: Axial images of the cervical spine shows no acute fracture or subluxation. Computer processed images shows no acute fracture or subluxation.  Mild degenerative changes C1-C2 articulation.  Mild anterior spurring lower endplate of the C4-C5 and C6 vertebral body.  Moderate anterior spurring lower endplate of the C7 vertebral body.  Mild disc space flattening at C5-C6 level.  There is sclerosis  and mixed lytic appearance of the C6 vertebral body.  This is highly suspicious for metastatic disease. Further evaluation with MRI is recommended.  Images of the lung apices shows partially loculated right pleural effusion.  Right apical scarring is noted.  No diagnostic pneumothorax.  IMPRESSION:  1.  No acute fracture or subluxation.  Degenerative changes as described above. 2.  Mixed lytic sclerotic appearance of the C6 vertebral body. This is highly suspicious for metastatic disease.  Further evaluation with MRI is recommended. 3.  Partially visualized loculated right pleural effusion.   Original Report Authenticated By: Natasha Mead, M.D.     ASSESSMENT / PLAN:   Acute Respiratory Failure - recent admit for ? HCAP vs malignant chest findings / neg cytology O2 Dependent COPD  OSA Pleural Effusion - chest  tube from 2/7-2/17 placed by IR.  Peripheral smear/cytology with no malignant cells.  Concern for hemothorax with bleeding from old anterior chest tube site.  ? If communication to pleural space. Pulmonary HTN - PA on ECHO 104  Plan: -bipap support for now, ICU admit -CVTS consult for potential VATS and Pleux cath placement -O2 to keep saturations > 92% -assess ABG -DNR but ok for intubation -ask IR to evaluate for thoracentesis -BD's -noted ABG, likely has nml PCO2 in 50's and unable to compensate due to renal failure    HTN Chronic Diastolic CHF - ECHO 05/25/12 with nml systolic fxn, EF 62-95%, PA peak 104  Plan: -hold home anti-hypertensives (ARB) -monitor BP -daily weights   Acute Renal Failure Normal renal fxn on d/c 2/18.  Admit wth 2.13 in setting of metformin / ARB.  Plan: -gentle hydration -follow up BMP in am -d/c metformin, ARB  Diabetes Mellitus - on metformin at baseline, may not be best choice given hx of renal insufficency  Plan: -CBG's Q4 as mild low glucose in ER.  Consider SSI if >180 -hold oral agents   Hx of Invasive Ductal Breast Cancer - followed by Dr. [name].  Previously was on Herceptin but was d/c'd out of concern for CHF.  Now on Fulvestrant.   Carcinoid RLL  R DVT - (05/2012) on full dose lovenox Anemia - no significant drop  in Hgb   Plan: -hold lovenox for potential intervention by IR -f/u H/H -consider tx if <7 %   Acute Encephalopathy - in setting of respiratory distress, acute renal failure Falls - multiple falls in recent months.  Generalized decline.    Plan: -fall precautions -supportive care, minimize sedating medications    Hx of Recent C-Diff Rule out Pulmonary Infection - low clinical suspicion for infectious pulmonary process.    Plan: -hold abx -monitor for diarrhea -monitor fever curve / leukocytosis    Discussion with family per Dr. Sung Amabile regarding code status.  Son indicates she would not want CPR but is  ok for intubation if needed.    Canary Brim, NP-C Pulmonary and Critical Care Medicine [facility] Pager: [phone]  06/09/2012, 5:37 PM  I have interviewed and examined the patient and reviewed the database. I have formulated the assessment and plan as reflected in the note above with amendments made by me.   I have discussed with teaching service residents  I doubt there is an infectious component here. Have recommended D/C of abx She had good symptomatic relief after drainage of R pleural space previously and likely would again this hospitalization. However, a more definitive strategy of mgmt would be favored.  ---Agree with eval by IR. If it is possible to get pleural space effectively drained by a PleurX catheter would proceed as such. She is a poor candidate for VATS which would be the most effective way to resolve the pleural effusion permanently.  IT would be appropriate to have involvement of Palliative Care service  If she continues to require ICU level of care after 2/22, PCCM will take over as primary service  Billy Fischer, MD;  PCCM service; Mobile [phone]

## 2012-06-09 NOTE — ED Provider Notes (Addendum)
I saw and evaluated the patient, reviewed the resident's note [name] agree with the findings and plan.  The patient appears to have recurrent pleural effusion.  She may need more definitive care for this.  Hypoxia.  Questionable healthcare associated pneumonia.  IV antibiotics now.  Blood cultures.  She does have urinary tract infection.  Urine culture sent.  Antibiotics will cover urine.  Also with acute renal failure with normal potassium.  IV hydration.  Altered mental status secondary to uremia hypoxia.  Head CT negative.  Admitted to the hospital.  1. Acute encephalopathy   2. Fall   3. Respiratory failure, acute-on-chronic   4. Leukocytosis   5. Headache   6. Pleural effusion   7. AKI (acute kidney injury)   8. UTI (urinary tract infection)   9. HCAP (healthcare-associated pneumonia)   10. Pleural effusion, right    Dg Chest 2 View  06/09/2012  *RADIOLOGY REPORT*  Clinical Data: Altered mental status.  CHEST - 2 VIEW  Comparison: One-view chest 06/05/2012.  Findings: The Port-A-Cath and pacing wires are stable.  The right pleural effusion is reaccumulating following removal of the chest tube.  There appears to be loculated component.  Right basilar airspace disease is present.  While this may represent atelectasis, infection is not excluded.  Mild pulmonary vascular congestion and cardiac enlargement are stable.  IMPRESSION:  1.  Reaccumulating right pleural effusion.  There may be a loculated component. 2.  Worsening right lower lobe airspace disease.  Infection is not excluded. 3.  Stable pulmonary vascular congestion.   Original Report Authenticated By: Marin Roberts, M.D.    Dg Chest 2 View  06/05/2012  *RADIOLOGY REPORT*  Clinical Data: 67 year old female shortness of breath.  Right chest tube.  CHEST - 2 VIEW  Comparison: 06/02/2012 and earlier.  Findings: AP and lateral views of the chest.  Right pigtail pleural drain remains in place.  No pneumothorax identified.  Small volume  residual right pleural effusion.  Residual right perihilar opacity. Left lung is stable and clear.  Stable cardiac size and mediastinal contours.  Visualized tracheal air column is within normal limits. Left chest Port-A-Cath and right chest cardiac pacemaker. Postoperative changes to the left axilla.  Stable visualized osseous structures.  IMPRESSION: 1.  Stable right pigtail pleural drain with small residual right pleural effusion. No pneumothorax. 2.  Continued right perihilar opacity.   Original Report Authenticated By: Erskine Speed, M.D.    Dg Chest 2 View  05/24/2012  *RADIOLOGY REPORT*  Clinical Data: Shortness of breath.  CHEST - 2 VIEW  Comparison: 05/18/2012.  Findings: The power port is stable.  The pacer wires are unchanged. The heart is enlarged but stable.  Mediastinal and hilar contours are prominent but unchanged.  There is a persistent moderate-sized right pleural effusion and underlying pulmonary edema.  No pneumothorax.  IMPRESSION: CHF with moderate sized right pleural effusion.   Original Report Authenticated By: Rudie Meyer, M.D.    Dg Chest 2 View  05/18/2012  *RADIOLOGY REPORT*  Clinical Data: Congestive heart failure with shortness of breath. COPD.  Controlled hypertension.  Diabetes and asthma.  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  CHEST - 2 VIEW  Comparison: 05/16/2012  Findings: A dual lead pacer is stable in position via a right subclavian approach.  A Port-A-Cath is in place via a left internal jugular approach and the tip is stable in the region of the superior cavoatrial junction.  Cardiomegaly is noted and  appears unchanged in degree.  Right pleural fluid has increased slightly since the previous exam. The pulmonary edema pattern has improved with pulmonary vascular congestion and mild residual edema identified.  Focal density at the right lung base likely represents a combination of atelectasis, pleural fluid and alveolar edema with a focal infiltrate not  completely excluded.  Surgical clips are identified along the right lateral thoracic wall.  A healing rib fractures seen associated the lateral aspect of the right seventh rib.  IMPRESSION: Improving pulmonary edema pattern with residual pulmonary vascular congestion and mild edema suggested.  Increased right pleural effusion with associated right basilar atelectasis.   Original Report Authenticated By: Rhodia Albright, M.D.    Dg Chest 2 View  05/16/2012  *RADIOLOGY REPORT*  Clinical Data: Respiratory failure.  Shortness of breath.  Cough and wheezing.  COPD.  Congestive heart failure.  CHEST - 2 VIEW  Comparison: 04/06/2012  Findings: Dual lead pacer in place.  Power port tip in good position in the superior vena cava.  The patient has new bilateral pulmonary edema with a moderate right pleural effusion.  No acute osseous abnormality.  IMPRESSION: Moderate bilateral pulmonary edema with a moderate right pleural effusion.   Original Report Authenticated By: Francene Boyers, M.D.    Ct Head Wo Contrast  06/09/2012  *RADIOLOGY REPORT*  Clinical Data:  Altered mental status  CT HEAD WITHOUT CONTRAST CT CERVICAL SPINE WITHOUT CONTRAST  Technique:  Multidetector CT imaging of the head and cervical spine was performed following the standard protocol without intravenous contrast.  Multiplanar CT image reconstructions of the cervical spine were also generated.  Comparison:  03/28/2012  CT HEAD  Findings: No skull fracture is noted.  Paranasal sinuses and mastoid air cells are unremarkable.  Mild cerebral atrophy is stable.  No intracranial hemorrhage, mass effect or midline shift.  No acute infarction.  No mass lesion is noted on this unenhanced scan.  IMPRESSION: No acute intracranial abnormality.  Mild cerebral atrophy.  CT CERVICAL SPINE  Findings: Axial images of the cervical spine shows no acute fracture or subluxation. Computer processed images shows no acute fracture or subluxation.  Mild degenerative changes  C1-C2 articulation.  Mild anterior spurring lower endplate of the C4-C5 and C6 vertebral body.  Moderate anterior spurring lower endplate of the C7 vertebral body.  Mild disc space flattening at C5-C6 level.  There is sclerosis  and mixed lytic appearance of the C6 vertebral body.  This is highly suspicious for metastatic disease. Further evaluation with MRI is recommended.  Images of the lung apices shows partially loculated right pleural effusion.  Right apical scarring is noted.  No diagnostic pneumothorax.  IMPRESSION:  1.  No acute fracture or subluxation.  Degenerative changes as described above. 2.  Mixed lytic sclerotic appearance of the C6 vertebral body. This is highly suspicious for metastatic disease.  Further evaluation with MRI is recommended. 3.  Partially visualized loculated right pleural effusion.   Original Report Authenticated By: Natasha Mead, M.D.    Ct Chest Wo Contrast  05/26/2012  *RADIOLOGY REPORT*  Clinical Data: Right-sided airspace disease and pleural effusion. History of breast cancer, right lower lobectomy, renal failure, right lung carcinoid.  CT CHEST WITHOUT CONTRAST  Technique:  Multidetector CT imaging of the chest was performed following the standard protocol without IV contrast.  Comparison: 04/01/2012  Findings: Diffuse cardiac enlargement.  Calcification in the coronary arteries, aorta, and aortic valve.  Pericardial effusion. Moderate sized right pleural effusion with diffuse atelectasis. Partial aeration  of the right lower lung. Small left pleural effusion with basilar atelectasis.  Visualization of the lungs is limited due to respiratory motion artifact and atelectasis. The pleural effusions and atelectatic changes have progressed since the previous study.  No underlying mass lesion is specifically identified, but masses could easily be obscured by the overlying pulmonary parenchymal processes and effusions.  Enteric endotracheal tubes are present.  Diffuse enlargement of  the thyroid gland.  Surgical clips in the axillary regions bilaterally.  Old bilateral rib fractures possibly due to thoracotomy.  Cardiac pacemaker with generator pack in the right upper chest.  Limited visualization of the upper abdominal organs.  Diffuse degenerative changes throughout the thoracic spine.  Focal areas of vague sclerosis in the mid thoracic vertebrae are nonspecific but sclerotic metastases are not excluded.  IMPRESSION: Moderate sized right and small left pleural effusions with pulmonary atelectasis bilaterally.  Effusions have increased and aeration has decreased since previous studies.  Persistent cardiac enlargement of pericardial effusion.  Nonspecific foci of sclerosis and mid thoracic vertebra.  Sclerotic metastasis is not excluded.   Original Report Authenticated By: Burman Nieves, M.D.    Ct Cervical Spine Wo Contrast  06/09/2012  *RADIOLOGY REPORT*  Clinical Data:  Altered mental status  CT HEAD WITHOUT CONTRAST CT CERVICAL SPINE WITHOUT CONTRAST  Technique:  Multidetector CT imaging of the head and cervical spine was performed following the standard protocol without intravenous contrast.  Multiplanar CT image reconstructions of the cervical spine were also generated.  Comparison:  03/28/2012  CT HEAD  Findings: No skull fracture is noted.  Paranasal sinuses and mastoid air cells are unremarkable.  Mild cerebral atrophy is stable.  No intracranial hemorrhage, mass effect or midline shift.  No acute infarction.  No mass lesion is noted on this unenhanced scan.  IMPRESSION: No acute intracranial abnormality.  Mild cerebral atrophy.  CT CERVICAL SPINE  Findings: Axial images of the cervical spine shows no acute fracture or subluxation. Computer processed images shows no acute fracture or subluxation.  Mild degenerative changes C1-C2 articulation.  Mild anterior spurring lower endplate of the C4-C5 and C6 vertebral body.  Moderate anterior spurring lower endplate of the C7 vertebral  body.  Mild disc space flattening at C5-C6 level.  There is sclerosis  and mixed lytic appearance of the C6 vertebral body.  This is highly suspicious for metastatic disease. Further evaluation with MRI is recommended.  Images of the lung apices shows partially loculated right pleural effusion.  Right apical scarring is noted.  No diagnostic pneumothorax.  IMPRESSION:  1.  No acute fracture or subluxation.  Degenerative changes as described above. 2.  Mixed lytic sclerotic appearance of the C6 vertebral body. This is highly suspicious for metastatic disease.  Further evaluation with MRI is recommended. 3.  Partially visualized loculated right pleural effusion.   Original Report Authenticated By: Natasha Mead, M.D.    [place] Chest  05/25/2012  *RADIOLOGY REPORT*  Clinical Data: Shortness of breath, congestive heart failure, pulmonary edema, right-sided pleural effusion  CHEST ULTRASOUND  Comparison: None.  Findings: Limited ultrasound was performed of the right chest for the purposes of possible thoracentesis.  Ultrasound imaging reveals a small effusion, but this is not felt amendable to percutaneous thoracentesis.  No procedure performed  IMPRESSION: Limited ultrasound of chest finds small pleural effusion not amenable for thoracentesis  Read by Brayton El PA-C   Original Report Authenticated By: Richarda Overlie, M.D.    Dg Chest Port 1 View  06/05/2012  *RADIOLOGY REPORT*  Clinical Data: Chest tube removal  PORTABLE CHEST - 1 VIEW  Comparison: 06/05/2012  Findings: Right sided chest tube has been removed.  No pneumothorax.  Small right pleural effusion and right lung infiltrate are unchanged.  Left lung remains clear  IMPRESSION: No pneumothorax following right chest tube removal.   Original Report Authenticated By: Janeece Riggers, M.D.    Dg Chest Port 1 View  06/02/2012  *RADIOLOGY REPORT*  Clinical Data: Right chest tube  PORTABLE CHEST - 1 VIEW  Comparison: Yesterday  Findings: Left subclavian vein Port-A-Cath,  right subclavian dual lead pacemaker and leads, right internal jugular central venous catheter, and right chest tube are stable.  There is no pneumothorax.  Volume loss in the right lung with central and basilar dense opacity are stable.    Diffuse edema has improved.  IMPRESSION: Improved edema.  No pneumothorax.  Stable chest tube.   Original Report Authenticated By: Jolaine Click, M.D.    Dg Chest Port 1 View  06/01/2012  *RADIOLOGY REPORT*  Clinical Data: Right chest tube  PORTABLE CHEST - 1 VIEW  Comparison: 05/31/2012  Findings: Cardiomediastinal silhouette is stable.  Dual lead cardiac pacemaker is unchanged in position.  Stable left IJ Port-A- Cath position.  Stable right pigtail catheter position.  Stable right IJ central line position. Again noted mild interstitial edema without change in aeration. Stable basilar atelectasis or infiltrate.  IMPRESSION: Stable right pigtail catheter position.  Persistent mild congestion/edema.  No diagnostic pneumothorax.   Original Report Authenticated By: Natasha Mead, M.D.    Dg Chest Port 1 View  05/31/2012  *RADIOLOGY REPORT*  Clinical Data: Evaluate right effusion and chest tube  PORTABLE CHEST - 1 VIEW  Comparison: Portable chest x-ray of 05/30/2012  Findings: The lungs are poorly aerated and there is pulmonary vascular congestion present.  Opacity at the right lung base is most consistent with atelectasis and effusion.  Right chest tube, permanent pacemaker, right IJ central venous line, and Port-A-Cath remain.  Cardiomegaly is stable.  IMPRESSION: Slightly diminished aeration with little change in probable edema.   Original Report Authenticated By: Dwyane Dee, M.D.    Dg Chest Port 1 View  05/30/2012  *RADIOLOGY REPORT*  Clinical Data: Right effusion.  Respiratory distress.  PORTABLE CHEST - 1 VIEW  Comparison: 05/29/2012  Findings: Interval removal of endotracheal tube and NG tube.  Right chest pigtail catheter and left Port-A-Cath remain in place, unchanged.   No pneumothorax.  Bilateral airspace disease, right greater than left with small right effusion.  Stable cardiomegaly. No real change since prior study.  IMPRESSION: Interval extubation.  Otherwise no change.  No pneumothorax.   Original Report Authenticated By: Charlett Nose, M.D.    Dg Chest Port 1 View  05/29/2012  *RADIOLOGY REPORT*  Clinical Data: Pneumonia  PORTABLE CHEST - 1 VIEW  Comparison: Yesterday  Findings: Borderline cardiomegaly.  Bilateral pulmonary edema improved.  Right pleural effusion improved.  Right chest tube remains in place.  Right internal jugular vein central venous catheter and left internal jugular vein Port-A-Cath unchanged. Endotracheal and NG tubes stable.  No pneumothorax.  IMPRESSION: Improved edema.  Improved right pleural effusion.   Original Report Authenticated By: Jolaine Click, M.D.    Dg Chest Port 1 View  05/28/2012  *RADIOLOGY REPORT*  Clinical Data: Check endotracheal tube.  Right effusion.  Chest tube.  PORTABLE CHEST - 1 VIEW  Comparison: 05/27/2012  Findings: The patient is rotated towards the right, limiting the study.  Shallow inspiration.  Right pleural  effusion with atelectasis or consolidation in the right mid lung.  Right chest tube is in place.  Right central venous catheter, left central venous catheter, endotracheal tube, and enteric tubes are not changed in position. Perihilar infiltration on the left likely represents edema.  No pneumothorax.  Surgical clips in the axilla bilaterally.  Cardiac pacemaker.  IMPRESSION: No significant change since yesterday.  Appliances appear to be stable in position, allowing for patient rotation.  Persistent right pleural effusion and consolidation in the right lung. Perihilar infiltration or edema in the left lung.   Original Report Authenticated By: Burman Nieves, M.D.    Dg Chest Port 1 View  05/27/2012  *RADIOLOGY REPORT*  Clinical Data: 67 year old female pneumonia pleural effusion.  PORTABLE CHEST - 1 VIEW   Comparison: Chest CT 05/26/2012 and earlier.  Findings: Semi upright AP view at 0605 hours.  Pigtail right pleural drain now in place.  Interval decreased right pleural effusion and mild to moderately improved right lung ventilation.  Stable endotracheal tube, visualized enteric tube and left chest Port-A-Cath.  Stable right chest cardiac pacemaker.  Stable cardiac size and mediastinal contours.  Decreased vascular congestion.  No confluent opacity in the left lung.  No pneumothorax identified.  IMPRESSION: 1.  Right pleural drain placed with decreased pleural effusion and mild to moderately improved right lung ventilation.  Residual fluid with no pneumothorax identified. 2.  Otherwise stable lines and tubes. 3.  Decreased pulmonary vascular congestion.   Original Report Authenticated By: Erskine Speed, M.D.    Dg Chest Port 1 View  05/26/2012  *RADIOLOGY REPORT*  Clinical Data: Right pleural effusion.  PORTABLE CHEST - 1 VIEW  Comparison: 05/25/2012  Findings: Endotracheal tube, central venous catheter, and Port-A- Cath are in place.  Dual lead pacer in place.  There is increased pulmonary vascular congestion with slight left perihilar pulmonary edema.  Large loculated right pleural effusion has increased.  Most of the right lung is compressed.  IMPRESSION:  1.  New pulmonary vascular congestion on the left with new slight perihilar edema. 2.  Increased right effusion.   Original Report Authenticated By: Francene Boyers, M.D.    Dg Chest Port 1 View  05/25/2012  *RADIOLOGY REPORT*  Clinical Data: Endotracheal tube and central line placement  PORTABLE CHEST - 1 VIEW  Comparison: 05/24/2012; 05/18/2012; 05/16/2012  Findings:  Grossly unchanged enlarged cardiac silhouette and mediastinal contours.  Interval intubation with endotracheal tube overlying tracheal air column with tip regional to the level of the carina.  Interval placement of right jugular approach of the venous catheter with tip seen at least to the level  of the mid SVC.  Otherwise, stable positioning of remaining support apparatus.  Interval increase in now moderate to large partially loculated right-sided pleural effusion and adjacent opacities within the right lung.  Pulmonary vasculature is indistinct.  Right axillary surgical clips.  Grossly unchanged bones.  IMPRESSION: 1.  Endotracheal tube overlies tracheal air column the tip regional to the carina.  Retraction approximately 2 cm is recommended. 2.  Right jugular approach central venous catheter tip projects over at least the mid SVC.  No pneumothorax. 3.  Interval increase in moderate to large likely loculated right- sided pleural effusion and adjacent opacities within the right lung, atelectasis versus infiltrate.  4.  Suspected worsening pulmonary edema.  This was made a call report.   Original Report Authenticated By: Tacey Ruiz, MD    Ct Perc Pleural Drain W/[place]well Cath W/img Guide  05/26/2012  *  RADIOLOGY REPORT*  Indication: Increasing right-sided pleural effusion, inability to wean from ventilator  CT GUIDED RIGHT SIDED PLEURAL DRAINAGE CATHETER PLACEMENT  Comparison: Chest CT - 05/25/2012; chest radiograph - 05/26/2012; 05/25/2012; 05/24/2012; chest ultrasound - 05/25/2012  Medications: Fentanyl 50 mcg IV; Versed 2 mg IV  Total Moderate Sedation time: 24 minutes  Contrast: None  Complications: None immediate  Technique / Findings:  Informed written consent was obtained from the patient's family after a discussion of the risks, benefits and alternatives to treatment.  The patient was placed supine on the CT gantry and a pre procedural CT was performed re-demonstrating the known small to moderate-sized right-sided pleural effusion.  The procedure was planned.   A timeout was performed prior to the initiation of the procedure.  The right anterior chest was prepped and draped in the usual sterile fashion.   The overlying soft tissues were anesthetized with 1% lidocaine with epinephrine.  Appropriate  trajectory was planned with the use of a 22 gauge spinal needle.  An 18 gauge trocar needle was advanced into the right pleural space a short Amplatz super stiff wire was coiled within the collection. Appropriate positioning was confirmed with a limited CT scan.  The tract was serially dilated allowing placement of a 10 [place] all- purpose drainage catheter.  Appropriate positioning was confirmed with a limited postprocedural CT scan with the drainage catheter directed towards the right lung apex.  Approximately 400 ml of simple, serous fluid was aspirated from the right pleural space.  The tube was connected to a Pleur-vac and sutured in place.  A dressing was placed.  The patient tolerated the procedure well without immediate post procedural complication.  Impression:  Successful CT guided placement of a 10 [place] all purpose drain catheter into the right pleural space with aspiration of 400 mL of serious, simple appearing pleural fluid.  Samples were sent to the laboratory as requested by the ordering clinical team.   Original Report Authenticated By: Tacey Ruiz, MD   I personally reviewed the imaging tests through PACS system I reviewed available ER/hospitalization records through the EMR Results for orders placed during the hospital encounter of 06/09/12  URINALYSIS, ROUTINE W REFLEX MICROSCOPIC      Result Value Range   Color, Urine AMBER (*) YELLOW   APPearance CLEAR  CLEAR   Specific Gravity, Urine 1.030  1.005 - 1.030   pH 5.0  5.0 - 8.0   Glucose, UA NEGATIVE  NEGATIVE mg/dL   Hgb urine dipstick NEGATIVE  NEGATIVE   Bilirubin Urine SMALL (*) NEGATIVE   Ketones, ur 15 (*) NEGATIVE mg/dL   Protein, ur 161 (*) NEGATIVE mg/dL   Urobilinogen, UA 1.0  0.0 - 1.0 mg/dL   Nitrite POSITIVE (*) NEGATIVE   Leukocytes, UA SMALL (*) NEGATIVE  COMPREHENSIVE METABOLIC PANEL      Result Value Range   Sodium 139  135 - 145 mEq/L   Potassium 4.4  3.5 - 5.1 mEq/L   Chloride 103  96 - 112 mEq/L   CO2  26  19 - 32 mEq/L   Glucose, Bld 102 (*) 70 - 99 mg/dL   BUN 28 (*) 6 - 23 mg/dL   Creatinine, Ser 0.96 (*) 0.50 - 1.10 mg/dL   Calcium 8.3 (*) 8.4 - 10.5 mg/dL   Total Protein 7.6  6.0 - 8.3 g/dL   Albumin 2.6 (*) 3.5 - 5.2 g/dL   AST 30  0 - 37 U/L   ALT 13  0 -  35 U/L   Alkaline Phosphatase 54  39 - 117 U/L   Total Bilirubin <0.1 (*) 0.3 - 1.2 mg/dL   GFR calc non Af Amer 23 (*) >90 mL/min   GFR calc Af Amer 27 (*) >90 mL/min  CBC WITH DIFFERENTIAL      Result Value Range   WBC 14.7 (*) 4.0 - 10.5 K/uL   RBC 4.37  3.87 - 5.11 MIL/uL   Hemoglobin 8.7 (*) 12.0 - 15.0 g/dL   HCT 40.3 (*) 47.4 - 25.9 %   MCV 71.6 (*) 78.0 - 100.0 fL   MCH 19.9 (*) 26.0 - 34.0 pg   MCHC 27.8 (*) 30.0 - 36.0 g/dL   RDW 56.3 (*) 87.5 - 64.3 %   Platelets 446 (*) 150 - 400 K/uL   Neutrophils Relative 67  43 - 77 %   Lymphocytes Relative 20  12 - 46 %   Monocytes Relative 12  3 - 12 %   Eosinophils Relative 1  0 - 5 %   Basophils Relative 0  0 - 1 %   Neutro Abs 9.9 (*) 1.7 - 7.7 K/uL   Lymphs Abs 2.9  0.7 - 4.0 K/uL   Monocytes Absolute 1.8 (*) 0.1 - 1.0 K/uL   Eosinophils Absolute 0.1  0.0 - 0.7 K/uL   Basophils Absolute 0.0  0.0 - 0.1 K/uL   RBC Morphology POLYCHROMASIA PRESENT     Smear Review LARGE PLATELETS PRESENT    ETHANOL      Result Value Range   Alcohol, Ethyl (B) <11  0 - 11 mg/dL  URINE RAPID DRUG SCREEN (HOSP PERFORMED)      Result Value Range   Opiates NONE DETECTED  NONE DETECTED   Cocaine NONE DETECTED  NONE DETECTED   Benzodiazepines NONE DETECTED  NONE DETECTED   Amphetamines NONE DETECTED  NONE DETECTED   Tetrahydrocannabinol NONE DETECTED  NONE DETECTED   Barbiturates NONE DETECTED  NONE DETECTED  AMMONIA      Result Value Range   Ammonia 26  11 - 60 umol/L  GLUCOSE, CAPILLARY      Result Value Range   Glucose-Capillary 104 (*) 70 - 99 mg/dL   Comment 1 Documented in Chart     Comment 2 Notify RN    URINE MICROSCOPIC-ADD ON      Result Value Range   Squamous  Epithelial / LPF MANY (*) RARE   WBC, UA 7-10  <3 WBC/hpf   RBC / HPF 0-2  <3 RBC/hpf   Bacteria, UA MANY (*) RARE   Casts HYALINE CASTS (*) NEGATIVE   Urine-Other AMORPHOUS URATES/PHOSPHATES    POCT I-STAT 3, BLOOD GAS (G3+)      Result Value Range   pH, Arterial 7.302 (*) 7.350 - 7.450   pCO2 arterial 53.2 (*) 35.0 - 45.0 mmHg   pO2, Arterial 54.0 (*) 80.0 - 100.0 mmHg   Bicarbonate 26.2 (*) 20.0 - 24.0 mEq/L   TCO2 28  0 - 100 mmol/L   O2 Saturation 84.0     Acid-base deficit 1.0  0.0 - 2.0 mmol/L   Patient temperature 98.7 F     Collection site RADIAL, ALLEN'S TEST ACCEPTABLE     Drawn by Operator     Sample type ARTERIAL        Lyanne Co, MD 06/09/12 1546  4:56 PM CRITICAL CARE Performed by: Lyanne Co Total critical care time: 35 Critical care time was exclusive of separately billable procedures and treating  other patients. Critical care was necessary to treat or prevent imminent or life-threatening deterioration. Critical care was time spent personally by me on the following activities: development of treatment plan with patient and/or surrogate as well as nursing, discussions with consultants, evaluation of patient's response to treatment, examination of patient, obtaining history from patient or surrogate, ordering and performing treatments and interventions, ordering and review of laboratory studies, ordering and review of radiographic studies, pulse oximetry and re-evaluation of patient's condition.   Patient appears to be having worsening altered mental status.  Her respiratory failure seems to be worsening.  She be started on BiPAP at this time.  Albuterol and Atrovent.  Deep suctioning by respiratory therapy.  Repeat ABG pending.  Given her worsening course in emergency Department I'll speak with critical care team and work on admitting the patient to the intensive care unit.  She mailed to my required sedation.  She currently is a full code.  Lyanne Co, MD 06/09/12 1657   Date: 06/09/2012  Rate: 85  Rhythm: normal sinus rhythm  QRS Axis: normal  Intervals: normal  ST/T Wave abnormalities: normal  Conduction Disutrbances: none  Narrative Interpretation:   Old EKG Reviewed: No significant changes noted     Lyanne Co, MD 06/09/12 1711

## 2012-06-09 NOTE — ED Notes (Signed)
Phlebotomy at bedside. Will initiate antibiotics when BC drawn.

## 2012-06-09 NOTE — ED Notes (Signed)
Pt remains to be obtunded. Family at bedside. Admitting MD and MD Campos at bedside. Narcan administered.

## 2012-06-09 NOTE — Telephone Encounter (Signed)
Called her home. Someone else responded and said that she is on her way to ED via EMS.

## 2012-06-09 NOTE — ED Notes (Signed)
Admitting MD at bedside.

## 2012-06-09 NOTE — Progress Notes (Signed)
ANTIBIOTIC CONSULT NOTE - INITIAL  Pharmacy Consult for vancomycin Indication: rule out pneumonia  No Known Allergies  Patient Measurements:    Vital Signs: Temp: 98.3 F (36.8 C) (02/21 1238) Temp src: Oral (02/21 1238) BP: 103/50 mmHg (02/21 1500) Pulse Rate: 88 (02/21 1500) Intake/Output from previous day:   Intake/Output from this shift:    Labs:  Recent Labs  06/09/12 1325  WBC 14.7*  HGB 8.7*  PLT 446*  CREATININE 2.13*   The CrCl is unknown because both a height and weight (above a minimum accepted value) are required for this calculation. No results found for this basename: VANCOTROUGH, VANCOPEAK, VANCORANDOM, GENTTROUGH, GENTPEAK, GENTRANDOM, TOBRATROUGH, TOBRAPEAK, TOBRARND, AMIKACINPEAK, AMIKACINTROU, AMIKACIN,  in the last 72 hours   Microbiology: No results found for this or any previous visit (from the past 720 hour(s)).  Medical History: Past Medical History  Diagnosis Date  . CHF (congestive heart failure)      2-D echo 02/19/2009 showed left ventricle cavity size normal; systolic function normal, estimated ejection fraction 55%; wall motion normal; no regional wall motion abnormalities; PA peak pressure 47mm Hg.  [name] COPD (chronic obstructive pulmonary disease)     Pulmonary function tests on 08/05/2010 showed mixed obstructive and restrictive lung disease with no significant response to bronchodilator, and decreased diffusion capacity that corrects to normal range when adjusted for the inhaled alveolar volume.  . Degenerative joint disease   . AV block, complete     S/P dual-chamber for symptomatic episodes of bradycardia and complete heart block.  . Pacemaker     S/P dual-chamber for symptomatic episodes of bradycardia and complete heart block.  . Adenomatous polyp of colon     Tubular adenomas X 2 found 06/1999; negative screening colonoscopy 02/13/2004 by Dr. [name].  . Hypercholesteremia   . Obstructive sleep apnea     Baseline  diagnostic nocturnal polysomnogram on July 27, 2005 showed severe obstructive sleep apnea/hypopnea syndrome, with AHI 153.2 per hour.  Last nocturnal polysomnogram done for CPAP titration was 05/01/2009; CPAP was titrated to 13 CWP, AHI 1.8 per hour using a large [employer]ics FitLife Full Face Mask with heated humidifier.  . Peripheral autonomic neuropathy due to diabetes mellitus   . 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 [name]  . Constipation   . Hand pain   . Palpitation   . Rib pain     right sided   . DM type 2 (diabetes mellitus, type 2)   . Skin rash   . Urinary incontinence   . Proliferative diabetic retinopathy(362.02)     S/P panretinal photocoagulation by Dr. [name]  . Vitreous hemorrhage     With tractional detachment, left eye; S/P posterior vitrectomy with membrane peel by Dr. [name] 04/06/2005  . Dyspnea on exertion     Cardiac cath 06/17/2005 by Dr. [name] showed LVEF 50-55%, 30% proximal LAD stenosis, small diagonals.   . Shingles rash 03/25/11  . 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 Followed by Dr. [name].  . Breast cancer, stage 3 102/13    right hx lumpectomy  1994, bx today=invasive ductal ca  . Allergy     thinitis  . Asthma   . Bronchitis     hx  . Cataract     b/l surgery  . DM hyperosmolarity type II   . Hypertension   . TIA (transient ischemic attack)   . Anemia   . DJD (degenerative joint disease)   . DM neuropathies   . Renal insufficiency     Medications:  Anti-infectives   Start     Dose/Rate Route Frequency Ordered Stop   06/09/12 1530   levofloxacin (LEVAQUIN) IVPB 750 mg     750 mg 100 mL/hr over 90 Minutes Intravenous  Once 06/09/12 1519     06/09/12 1530  vancomycin (VANCOCIN) 2,500 mg in sodium chloride 0.9 % 500 mL IVPB     2,500 mg 250 mL/hr over 120 Minutes Intravenous  Once 06/09/12 1523     06/09/12 1515  piperacillin-tazobactam (ZOSYN) IVPB 3.375 g     3.375 g 12.5 mL/hr over 240 Minutes Intravenous  Once 06/09/12 1509       Assessment: 66 yof with multiple recent hospitalizations to start on empiric vancomycin for possible pneumonia. MD also ordered 1x doses of levaquin 750mg  IV and zosyn 3.375gm IV. Pt is currently afebrile and WBC is elevated at 14.7. Her Scr is also elevated.   Vanc 2/21>> Levaquin x 1 2/21 Zosyn x 1 2/21  Goal of Therapy:  Vancomycin trough level 15-20 mcg/ml  Plan:  1. Vancomycin 2500mg  IV x 1 2. Vanc 1750mg  IV Q24H starting tomorrow 3. F/u addition of further antibiotic coverage for HCAP 4. F/u renal fxn, C&S, clinical status and trough at [facility], Drake Leach 06/09/2012,3:23 PM

## 2012-06-09 NOTE — ED Notes (Signed)
Respiratory at bedside doing ABG

## 2012-06-09 NOTE — ED Notes (Signed)
[phone] Melissa Villa [phone] YNWGN, son requesting update when Mom is transferred upstairs.

## 2012-06-09 NOTE — Progress Notes (Signed)
Patient transferred from emergency room to MICU room 2112. Patient lethargic and showed signs of increased work of breathing. Arterial blood gas was obtained. Nurse and doctor notified of results. Increased patient IPAP to increase tidal volumes and decreased Fio2.

## 2012-06-09 NOTE — Telephone Encounter (Signed)
Pt called patient experience complaining that she has not heard from the doctors the results of her "TB test" done while in the hospital, she states she was told she would be called. She is upset because of this thus calling patient experience instead of clinic, may reach her at the ph# on her chart

## 2012-06-09 NOTE — Telephone Encounter (Signed)
Will close.

## 2012-06-09 NOTE — ED Notes (Signed)
Attempted to call report to Stepdown. Stepdown requesting I contact CC to ensure she is stepdown appropriate. Per CCMD, pt should have been assigned to ICU. Insurance underwriter contacted.

## 2012-06-09 NOTE — ED Notes (Signed)
Per EMS, patient fell last night prior to going to bed at 9pm.   Patient hit her head, but claims no injury.  Patient claims that she just had a headache.   Patient was lethargic this morning when daughter found her.  Daughter claims that she was unable to arouse patient and called EMS.  EMS advised that patient was fine when they arrived.  No real stroke symptoms appreciated by EMS.

## 2012-06-09 NOTE — ED Notes (Signed)
EKG and blood gas results given to [place], MD

## 2012-06-09 NOTE — ED Notes (Signed)
PA at bedside.

## 2012-06-09 NOTE — ED Notes (Signed)
IV team at bedside accessing port. 

## 2012-06-09 NOTE — ED Notes (Signed)
Spoke with Fayrene Fearing from Pharmacy and he stated that I can hang the the Vancomycin with the Zosyn.

## 2012-06-09 NOTE — ED Notes (Signed)
Per MD Simonds complete Zosyn and Vancomycin. DC Levaquin.

## 2012-06-09 NOTE — ED Notes (Signed)
Respiratory called to advise of blood gas.    On their way.

## 2012-06-09 NOTE — ED Provider Notes (Signed)
History     CSN: [id number]  Arrival date & time 06/09/12  1221   First MD Initiated Contact with Patient 06/09/12 1307      Chief Complaint  Patient presents with  . Altered Mental Status    (Consider location/radiation/quality/duration/timing/severity/associated sxs/prior treatment) HPI Comments: 67 y.o patient PMH acute on chronic respiratory failure, chronic diastolic CHF, h/o right invasive ductal breast cancer, carcinoid lung, COPD, HTN, DM, OSA, pleural effusion, right DVT (05/2012), C. Diff (05/2012).   She presents after multiple falls.  She states she fell this morning and fell the night before last and hit her head on the back of the tub.  She lives alone and the fall was not witnessed.  Now she complains of h/a in the back of her head and being more sleepy.  She was found today by her daughter and difficult to arouse.  Denies n/v, sob (on 2L home o2).  She has had declining health since 02/2012 and weakness in her lower extremities since being hospitalized multiple times and has to use a walker at home.  She was trying to use her walker when she fell recently. She came in C collar.  She was recently d/c'ed from the hospital (2/5-2/17) for acute on chronic resp. Failure possible HCAP, right leg DVT, C.diff, AKI, and CT guided drainage of pleural effusion.    SH: 3 kids; lives alone  Patient is a 67 y.o. female presenting with altered mental status.  Altered Mental Status Associated symptoms include headaches. Pertinent negatives include no abdominal pain, chest pain, nausea, neck pain or vomiting.    Past Medical History  Diagnosis Date  . CHF (congestive heart failure)      2-D echo 02/19/2009 showed left ventricle cavity size normal; systolic function normal, estimated ejection fraction 55%; wall motion normal; no regional wall motion abnormalities; PA peak pressure 47mm Hg.  [name] COPD (chronic obstructive pulmonary disease)     Pulmonary function tests on 08/05/2010 showed  mixed obstructive and restrictive lung disease with no significant response to bronchodilator, and decreased diffusion capacity that corrects to normal range when adjusted for the inhaled alveolar volume.  . Degenerative joint disease   . AV block, complete     S/P dual-chamber for symptomatic episodes of bradycardia and complete heart block.  . Pacemaker     S/P dual-chamber for symptomatic episodes of bradycardia and complete heart block.  . Adenomatous polyp of colon     Tubular adenomas X 2 found 06/1999; negative screening colonoscopy 02/13/2004 by Dr. [name].  . Hypercholesteremia   . Obstructive sleep apnea     Baseline diagnostic nocturnal polysomnogram on July 27, 2005 showed severe obstructive sleep apnea/hypopnea syndrome, with AHI 153.2 per hour.  Last nocturnal polysomnogram done for CPAP titration was 05/01/2009; CPAP was titrated to 13 CWP, AHI 1.8 per hour using a large Respironics FitLife Full Face Mask with heated humidifier.  . Peripheral autonomic neuropathy due to diabetes mellitus   . 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 [name]  . Constipation   . Hand pain   . Palpitation   . Rib pain     right sided   . DM type 2 (diabetes mellitus, type 2)   . Skin rash   . Urinary incontinence   . Proliferative diabetic retinopathy(362.02)     S/P panretinal photocoagulation by Dr. [name]  . Vitreous hemorrhage     With tractional detachment, left  eye; S/P posterior vitrectomy with membrane peel by Dr. [name] 04/06/2005  . Dyspnea on exertion     Cardiac cath 06/17/2005 by Dr. [name] showed LVEF 50-55%, 30% proximal LAD stenosis, small diagonals.   . Shingles rash 03/25/11  . Breast cancer     S/P left lumpectomy and axillary node dissection 03/1991 for a T1 N0 medullary breast cancer, no lymph node involvement, treated with tamoxifen for 2 years; S/P right lumpectomy and axillary lymph node dissection August 1995 for a  T1 N0 microinvasive breast cancer, treated with Arimidex for 5 years.  Found to have invasive ductal carcinoma of the right breast in 10/ Aug 31, 2011. Followed by Dr. [name].  . Breast cancer, stage 3 102/13    right hx lumpectomy 1994, bx today=invasive ductal ca  . Allergy     thinitis  . Asthma   . Bronchitis     hx  . Cataract     b/l surgery  . DM hyperosmolarity type II   . Hypertension   . TIA (transient ischemic attack)   . Anemia   . DJD (degenerative joint disease)   . DM neuropathies   . Renal insufficiency     Past Surgical History  Procedure Laterality Date  . Mastectomy partial / lumpectomy w/ axillary lymphadenectomy  08-31-1990, 1993-08-30    S/P left lumpectomy and axillary lymph node dissection December 1992 for a T1 N0 medullary breast cancer, no lymph node involvement, treated with tamoxifen for 2 years; S/P right lumpectomy and axillary lymph node dissection August 1995 for a T1 N0 microinvasive breast cancer, treated with Arimidex for 5 years.  Patient is followed by Dr. [name] at the cancer center.  . Lung lobectomy  06/13/2001    S/P right lower lobe lobectomy 06/13/2001 by Dr. [name]  . Pacemaker insertion  04/20/2001  . Mastectomy partial / lumpectomy    . Lobectomy    . Pacemaker insertion    . Cholecystectomy    . Tonsillectomy    . Port  a  cath      Family History  Problem Relation Age of Onset  . Breast cancer Mother 42    Deceased Aug 31, 2002  . Diabetes type II Mother   . Heart attack Brother   . Cervical cancer Neg Hx   . Colon cancer Neg Hx   . Breast cancer Mother   . Diabetes Mellitus II Mother   . Hypertension Brother   . Coronary artery disease Brother   . Stroke Brother   . Diabetes type II Brother   . Hypertension Sister     History  Substance Use Topics  . Smoking status: Former Smoker -- 1.00 packs/day for 15 years    Types: Cigarettes    Quit date: 04/19/2001  . Smokeless tobacco: Never Used  . Alcohol Use: No    OB History    Grav Para Term Preterm Abortions TAB SAB Ect Mult Living                  Review of Systems  HENT: Negative for neck pain.   Respiratory: Negative for shortness of breath.   Cardiovascular: Negative for chest pain.  Gastrointestinal: Negative for nausea, vomiting, abdominal pain and diarrhea.  Genitourinary: Negative for dysuria.  Neurological: Positive for headaches. Negative for dizziness, syncope and light-headedness.  Psychiatric/Behavioral: Positive for altered mental status.  All other systems reviewed and are negative.    Allergies  Review of patient's allergies indicates no known allergies.  Home  Medications   Current Outpatient Rx  Name  Route  Sig  Dispense  Refill  . albuterol (PROAIR HFA) 108 (90 BASE) MCG/ACT inhaler   Inhalation   Inhale 2 puffs into the lungs 4 (four) times daily as needed for wheezing or shortness of breath.   1 Inhaler   11   . albuterol (PROVENTIL) (2.5 MG/3ML) 0.083% nebulizer solution   Nebulization   Take 3 mLs (2.5 mg total) by nebulization every 6 (six) hours as needed for wheezing or shortness of breath.   75 mL   12   . amLODipine (NORVASC) 10 MG tablet   Oral   Take 10 mg by mouth daily before breakfast.         . Bepotastine Besilate (BEPREVE) 1.5 % SOLN   Both Eyes   Place 1 drop into both eyes 2 (two) times daily.         . budesonide (PULMICORT) 0.5 MG/2ML nebulizer solution   Nebulization   Take 2 mLs (0.5 mg total) by nebulization 2 (two) times daily.   2 mL   3   . calcium-vitamin D (OSCAL WITH D) 250-125 MG-UNIT per tablet   Oral   Take 1 tablet by mouth daily.         . carvedilol (COREG) 12.5 MG tablet   Oral   Take 2 tablets (25 mg total) by mouth 2 (two) times daily.   120 tablet   6   . cetirizine (ZYRTEC) 10 MG tablet   Oral   Take 10 mg by mouth daily.         . citalopram (CELEXA) 10 MG tablet   Oral   Take 1 tablet (10 mg total) by mouth daily.   30 tablet   6   . Dexlansoprazole  (DEXILANT) 60 MG capsule   Oral   Take 60 mg by mouth every morning. Take 15 minutes before breakfast.         . enoxaparin (LOVENOX) 120 MG/0.8ML injection   Subcutaneous   Inject 0.8 mLs (120 mg total) into the skin every 12 (twelve) hours.   60 Syringe   11   . Fluticasone-Salmeterol (ADVAIR) 250-50 MCG/DOSE AEPB   Inhalation   Inhale 1 puff into the lungs every 12 (twelve) hours.         . furosemide (LASIX) 20 MG tablet   Oral   Take 1 tablet (20 mg total) by mouth daily.   30 tablet   11   . insulin glargine (LANTUS) 100 UNIT/ML injection   Subcutaneous   Inject 42 Units into the skin at bedtime.         . IRON PO   Oral   Take 1 tablet by mouth daily.         [name] lidocaine-prilocaine (EMLA) cream   Topical   Apply 1 application topically once. Before procedure         . metFORMIN (GLUCOPHAGE) 850 MG tablet   Oral   Take 850 mg by mouth 3 (three) times daily.         . metroNIDAZOLE (FLAGYL) 500 MG tablet   Oral   Take 1 tablet (500 mg total) by mouth every 8 (eight) hours.   15 tablet   0   . mometasone (NASONEX) 50 MCG/ACT nasal spray   Nasal   Place 2 sprays into the nose daily. scheduled         . Multiple Vitamin (MULTIVITAMIN WITH MINERALS) TABS  Oral   Take 1 tablet by mouth daily.         [name] oxyCODONE (OXY IR/ROXICODONE) 5 MG immediate release tablet   Oral   Take 1-2 tablets (5-10 mg total) by mouth every 6 (six) hours as needed for pain.   60 tablet   0   . pregabalin (LYRICA) 200 MG capsule   Oral   Take 1 capsule (200 mg total) by mouth 2 (two) times daily.   60 capsule   1   . rosuvastatin (CRESTOR) 5 MG tablet   Oral   Take 10 mg by mouth daily.         . sitaGLIPtin (JANUVIA) 100 MG tablet   Oral   Take 100 mg by mouth daily.         [name] tiotropium (SPIRIVA) 18 MCG inhalation capsule   Inhalation   Place 1 capsule (18 mcg total) into inhaler and inhale daily.   30 capsule   11   . traMADol (ULTRAM) 50 MG  tablet   Oral   Take 50 mg by mouth every 6 (six) hours as needed. For pain         . valsartan (DIOVAN) 80 MG tablet   Oral   Take 160 mg by mouth 2 (two) times daily.           BP 100/54  Pulse 87  Temp(Src) 97.8 F (36.6 C) (Oral)  Resp 16  SpO2 93%  Physical Exam  Nursing note and vitals reviewed. Constitutional: She is oriented to person, place, and time. She appears well-developed and well-nourished. She is cooperative. No distress. Cervical collar and backboard in place.  arousable following commands  HENT:  Head: Normocephalic and atraumatic.  Mouth/Throat: Oropharynx is clear and moist and mucous membranes are normal. No oropharyngeal exudate.  Eyes: Conjunctivae are normal. Pupils are equal, round, and reactive to light. Right eye exhibits no discharge. Left eye exhibits no discharge. No scleral icterus.  Cardiovascular: Normal rate, regular rhythm, S1 normal, S2 normal and normal heart sounds.   No murmur heard. Pulmonary/Chest: Effort normal. No respiratory distress. She has wheezes.  B/l wheezing  Abdominal: Soft. Bowel sounds are normal. There is no tenderness.  Obese ab  Neurological: She is alert and oriented to person, place, and time.  B/l lower extremities with 3/5 strength,  4+/5 strength b/l upper extremities Follows commands GSC 14-15  Skin: Skin is warm, dry and intact. No rash noted. She is not diaphoretic.  Psychiatric: She has a normal mood and affect. Her speech is normal and behavior is normal. Judgment and thought content normal. Cognition and memory are normal.    ED Course  Procedures (including critical care time)  Labs Reviewed  URINALYSIS, ROUTINE W REFLEX MICROSCOPIC - Abnormal; Notable for the following:    Color, Urine AMBER (*)    Bilirubin Urine SMALL (*)    Ketones, ur 15 (*)    Protein, ur 100 (*)    Nitrite POSITIVE (*)    Leukocytes, UA SMALL (*)    All other components within normal limits  COMPREHENSIVE METABOLIC  PANEL - Abnormal; Notable for the following:    Glucose, Bld 102 (*)    BUN 28 (*)    Creatinine, Ser 2.13 (*)    Calcium 8.3 (*)    Albumin 2.6 (*)    Total Bilirubin <0.1 (*)    GFR calc non Af Amer 23 (*)    GFR calc Af Amer 27 (*)  All other components within normal limits  CBC WITH DIFFERENTIAL - Abnormal; Notable for the following:    WBC 14.7 (*)    Hemoglobin 8.7 (*)    HCT 31.3 (*)    MCV 71.6 (*)    MCH 19.9 (*)    MCHC 27.8 (*)    RDW 25.2 (*)    Platelets 446 (*)    Neutro Abs 9.9 (*)    Monocytes Absolute 1.8 (*)    All other components within normal limits  GLUCOSE, CAPILLARY - Abnormal; Notable for the following:    Glucose-Capillary 104 (*)    All other components within normal limits  URINE MICROSCOPIC-ADD ON - Abnormal; Notable for the following:    Squamous Epithelial / LPF MANY (*)    Bacteria, UA MANY (*)    Casts HYALINE CASTS (*)    All other components within normal limits  POCT I-STAT 3, BLOOD GAS (G3+) - Abnormal; Notable for the following:    pH, Arterial 7.302 (*)    pCO2 arterial 53.2 (*)    pO2, Arterial 54.0 (*)    Bicarbonate 26.2 (*)    All other components within normal limits  URINE CULTURE  CULTURE, BLOOD (ROUTINE X 2)  CULTURE, BLOOD (ROUTINE X 2)  ETHANOL  URINE RAPID DRUG SCREEN (HOSP PERFORMED)  AMMONIA   Dg Chest 2 View  06/09/2012  *RADIOLOGY REPORT*  Clinical Data: Altered mental status.  CHEST - 2 VIEW  Comparison: One-view chest 06/05/2012.  Findings: The Port-A-Cath and pacing wires are stable.  The right pleural effusion is reaccumulating following removal of the chest tube.  There appears to be loculated component.  Right basilar airspace disease is present.  While this may represent atelectasis, infection is not excluded.  Mild pulmonary vascular congestion and cardiac enlargement are stable.  IMPRESSION:  1.  Reaccumulating right pleural effusion.  There may be a loculated component. 2.  Worsening right lower lobe  airspace disease.  Infection is not excluded. 3.  Stable pulmonary vascular congestion.   Original Report Authenticated By: Marin Roberts, M.D.    Ct Head Wo Contrast  06/09/2012  *RADIOLOGY REPORT*  Clinical Data:  Altered mental status  CT HEAD WITHOUT CONTRAST CT CERVICAL SPINE WITHOUT CONTRAST  Technique:  Multidetector CT imaging of the head and cervical spine was performed following the standard protocol without intravenous contrast.  Multiplanar CT image reconstructions of the cervical spine were also generated.  Comparison:  03/28/2012  CT HEAD  Findings: No skull fracture is noted.  Paranasal sinuses and mastoid air cells are unremarkable.  Mild cerebral atrophy is stable.  No intracranial hemorrhage, mass effect or midline shift.  No acute infarction.  No mass lesion is noted on this unenhanced scan.  IMPRESSION: No acute intracranial abnormality.  Mild cerebral atrophy.  CT CERVICAL SPINE  Findings: Axial images of the cervical spine shows no acute fracture or subluxation. Computer processed images shows no acute fracture or subluxation.  Mild degenerative changes C1-C2 articulation.  Mild anterior spurring lower endplate of the C4-C5 and C6 vertebral body.  Moderate anterior spurring lower endplate of the C7 vertebral body.  Mild disc space flattening at C5-C6 level.  There is sclerosis  and mixed lytic appearance of the C6 vertebral body.  This is highly suspicious for metastatic disease. Further evaluation with MRI is recommended.  Images of the lung apices shows partially loculated right pleural effusion.  Right apical scarring is noted.  No diagnostic pneumothorax.  IMPRESSION:  1.  No acute fracture or subluxation.  Degenerative changes as described above. 2.  Mixed lytic sclerotic appearance of the C6 vertebral body. This is highly suspicious for metastatic disease.  Further evaluation with MRI is recommended. 3.  Partially visualized loculated right pleural effusion.   Original Report  Authenticated By: Natasha Mead, M.D.    Ct Cervical Spine Wo Contrast  06/09/2012  *RADIOLOGY REPORT*  Clinical Data:  Altered mental status  CT HEAD WITHOUT CONTRAST CT CERVICAL SPINE WITHOUT CONTRAST  Technique:  Multidetector CT imaging of the head and cervical spine was performed following the standard protocol without intravenous contrast.  Multiplanar CT image reconstructions of the cervical spine were also generated.  Comparison:  03/28/2012  CT HEAD  Findings: No skull fracture is noted.  Paranasal sinuses and mastoid air cells are unremarkable.  Mild cerebral atrophy is stable.  No intracranial hemorrhage, mass effect or midline shift.  No acute infarction.  No mass lesion is noted on this unenhanced scan.  IMPRESSION: No acute intracranial abnormality.  Mild cerebral atrophy.  CT CERVICAL SPINE  Findings: Axial images of the cervical spine shows no acute fracture or subluxation. Computer processed images shows no acute fracture or subluxation.  Mild degenerative changes C1-C2 articulation.  Mild anterior spurring lower endplate of the C4-C5 and C6 vertebral body.  Moderate anterior spurring lower endplate of the C7 vertebral body.  Mild disc space flattening at C5-C6 level.  There is sclerosis  and mixed lytic appearance of the C6 vertebral body.  This is highly suspicious for metastatic disease. Further evaluation with MRI is recommended.  Images of the lung apices shows partially loculated right pleural effusion.  Right apical scarring is noted.  No diagnostic pneumothorax.  IMPRESSION:  1.  No acute fracture or subluxation.  Degenerative changes as described above. 2.  Mixed lytic sclerotic appearance of the C6 vertebral body. This is highly suspicious for metastatic disease.  Further evaluation with MRI is recommended. 3.  Partially visualized loculated right pleural effusion.   Original Report Authenticated By: Natasha Mead, M.D.      1. Acute encephalopathy   2. Fall   3. Respiratory failure,  acute-on-chronic   4. Leukocytosis   5. Headache   6. Pleural effusion   7. AKI (acute kidney injury)   8. Lytic bone lesions on xray   9. UTI (urinary tract infection)       MDM  1.Acute on chronic respiratory failure with hypoxia (pO2 54) possibly 2/2 pleural effusion (?malignant) -Currently on [place], CXR -status post fall CT head, C-spine   2.Acute encephalopathy status post fall (on Lovenox) CT head neg  3. Headache 4. Pleural effusion possibly malignant with possibly loculation -Concern for HAP -blood cultures  -Rx Vanc/Zosyn/Levaquin  5. AKI-Creatinine 2.13  6. Leukocytosis  7. UTI -urine culture  8. Breast cancer history with concern for metastatic disease  Admit for further w/u and evaluation to IM teaching service Dr. Josem Kaufmann spoke with Dr. Wynn Maudlin MD [phone]         Annett Gula, MD 06/09/12 [phone]

## 2012-06-09 NOTE — H&P (Signed)
Hospital Admission Note Date: 06/09/2012  Patient name: Melissa Villa Medical record number: [id number] Date of birth: April 08, 1946 Age: 67 y.o. Gender: female PCP: [name], MD  Medical Service: Internal Medicine teaching Service  Attending physician:  Dr. Josem Kaufmann    1st Contact: Dr. Sherrine Maples   Pager: [phone] 2nd Contact: Dr. Manson Passey   Pager: [phone] After 5 pm or weekends: 1st Contact:      Pager: [phone] 2nd Contact:      Pager: [phone]   Chief Complaint:  altered mental status   History of Present Illness: Patient is a 67 year old female with PMH of acute chronic respiratory failure, chronic diastolic CHF, history of right invasive ductal breast cancer, carcinoid of the lung, COPD, HTN, DM, OSA, pleural effusion, right DVT (05/2012), and C. Diff (05/2012) presents with altered mental status after falling in her bathroom last night hitting the back of her head. She lives alone in the fall was not witnessed. This morning her daughter found her difficult to arouse, and she was brought into the emergency room by EMS. Once in the ED she was drowsy but arousable and alert and oriented.  She did c/o of headaches in the ED. ABG was obtained showed a pH of 7.3, PCO2 53.2, a PO2 of 54, bicarbonate 26.2, and O2 saturation 84%. She's placed on 3 L O2 with an oxygen saturation of 92% per to monitor.  She denies any increased shortness of breath, chest pain, nausea, vomiting, abdominal pain, or neck pain, per ED MD.   Of note, she was recently discharged the hospital on 2/17 after being admitted on 2/5 with acute on chronic respiratory failure, a loculated right pleural effusion, s/p chest tube placement and removal by IR, AKI, C. Difficile, right leg DVT, and was treated with antibiotics do to concerns for possible HCAP.  Meds: Current Outpatient Rx  Name  Route  Sig  Dispense  Refill  . albuterol (PROAIR HFA) 108 (90 BASE) MCG/ACT inhaler   Inhalation   Inhale 2 puffs into the lungs 4 (four)  times daily as needed for wheezing or shortness of breath.   1 Inhaler   11   . albuterol (PROVENTIL) (2.5 MG/3ML) 0.083% nebulizer solution   Nebulization   Take 3 mLs (2.5 mg total) by nebulization every 6 (six) hours as needed for wheezing or shortness of breath.   75 mL   12   . amLODipine (NORVASC) 10 MG tablet   Oral   Take 10 mg by mouth daily before breakfast.         . Bepotastine Besilate (BEPREVE) 1.5 % SOLN   Both Eyes   Place 1 drop into both eyes 2 (two) times daily.         . budesonide (PULMICORT) 0.5 MG/2ML nebulizer solution   Nebulization   Take 2 mLs (0.5 mg total) by nebulization 2 (two) times daily.   2 mL   3   . calcium-vitamin D (OSCAL WITH D) 250-125 MG-UNIT per tablet   Oral   Take 1 tablet by mouth daily.         . carvedilol (COREG) 12.5 MG tablet   Oral   Take 2 tablets (25 mg total) by mouth 2 (two) times daily.   120 tablet   6   . cetirizine (ZYRTEC) 10 MG tablet   Oral   Take 10 mg by mouth daily.         . citalopram (CELEXA) 10 MG tablet   Oral  Take 1 tablet (10 mg total) by mouth daily.   30 tablet   6   . Dexlansoprazole (DEXILANT) 60 MG capsule   Oral   Take 60 mg by mouth every morning. Take 15 minutes before breakfast.         . enoxaparin (LOVENOX) 120 MG/0.8ML injection   Subcutaneous   Inject 0.8 mLs (120 mg total) into the skin every 12 (twelve) hours.   60 Syringe   11   . Fluticasone-Salmeterol (ADVAIR) 250-50 MCG/DOSE AEPB   Inhalation   Inhale 1 puff into the lungs every 12 (twelve) hours.         . furosemide (LASIX) 20 MG tablet   Oral   Take 1 tablet (20 mg total) by mouth daily.   30 tablet   11   . insulin glargine (LANTUS) 100 UNIT/ML injection   Subcutaneous   Inject 42 Units into the skin at bedtime.         . IRON PO   Oral   Take 1 tablet by mouth daily.         [name] lidocaine-prilocaine (EMLA) cream   Topical   Apply 1 application topically once. Before procedure          . metFORMIN (GLUCOPHAGE) 850 MG tablet   Oral   Take 850 mg by mouth 3 (three) times daily.         . metroNIDAZOLE (FLAGYL) 500 MG tablet   Oral   Take 1 tablet (500 mg total) by mouth every 8 (eight) hours.   15 tablet   0   . mometasone (NASONEX) 50 MCG/ACT nasal spray   Nasal   Place 2 sprays into the nose daily. scheduled         . Multiple Vitamin (MULTIVITAMIN WITH MINERALS) TABS   Oral   Take 1 tablet by mouth daily.         [name] oxyCODONE (OXY IR/ROXICODONE) 5 MG immediate release tablet   Oral   Take 1-2 tablets (5-10 mg total) by mouth every 6 (six) hours as needed for pain.   60 tablet   0   . pregabalin (LYRICA) 200 MG capsule   Oral   Take 1 capsule (200 mg total) by mouth 2 (two) times daily.   60 capsule   1   . rosuvastatin (CRESTOR) 5 MG tablet   Oral   Take 10 mg by mouth daily.         . sitaGLIPtin (JANUVIA) 100 MG tablet   Oral   Take 100 mg by mouth daily.         [name] tiotropium (SPIRIVA) 18 MCG inhalation capsule   Inhalation   Place 1 capsule (18 mcg total) into inhaler and inhale daily.   30 capsule   11   . traMADol (ULTRAM) 50 MG tablet   Oral   Take 50 mg by mouth every 6 (six) hours as needed. For pain         . valsartan (DIOVAN) 80 MG tablet   Oral   Take 160 mg by mouth 2 (two) times daily.           Allergies: Allergies as of 06/09/2012  . (No Known Allergies)   Past Medical History  Diagnosis Date  . CHF (congestive heart failure)      2-D echo 02/19/2009 showed left ventricle cavity size normal; systolic function normal, estimated ejection fraction 55%; wall motion normal; no regional wall motion abnormalities; PA peak pressure  47mm Hg.  [name] COPD (chronic obstructive pulmonary disease)     Pulmonary function tests on 08/05/2010 showed mixed obstructive and restrictive lung disease with no significant response to bronchodilator, and decreased diffusion capacity that corrects to normal range when adjusted for  the inhaled alveolar volume.  . Degenerative joint disease   . AV block, complete     S/P dual-chamber for symptomatic episodes of bradycardia and complete heart block.  . Pacemaker     S/P dual-chamber for symptomatic episodes of bradycardia and complete heart block.  . Adenomatous polyp of colon     Tubular adenomas X 2 found 06/1999; negative screening colonoscopy 02/13/2004 by Dr. [name].  . Hypercholesteremia   . Obstructive sleep apnea     Baseline diagnostic nocturnal polysomnogram on July 27, 2005 showed severe obstructive sleep apnea/hypopnea syndrome, with AHI 153.2 per hour.  Last nocturnal polysomnogram done for CPAP titration was 05/01/2009; CPAP was titrated to 13 CWP, AHI 1.8 per hour using a large Respironics FitLife Full Face Mask with heated humidifier.  . Peripheral autonomic neuropathy due to diabetes mellitus   . 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 [name]  . Constipation   . Hand pain   . Palpitation   . Rib pain     right sided   . DM type 2 (diabetes mellitus, type 2)   . Skin rash   . Urinary incontinence   . Proliferative diabetic retinopathy(362.02)     S/P panretinal photocoagulation by Dr. [name]  . Vitreous hemorrhage     With tractional detachment, left eye; S/P posterior vitrectomy with membrane peel by Dr. [name] 04/06/2005  . Dyspnea on exertion     Cardiac cath 06/17/2005 by Dr. [name] showed LVEF 50-55%, 30% proximal LAD stenosis, small diagonals.   . Shingles rash 03/25/11  . 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 Followed by Dr. [name].  .  Breast cancer, stage 3 102/13    right hx lumpectomy 1994, bx today=invasive ductal ca  . Allergy     thinitis  . Asthma   . Bronchitis     hx  . Cataract     b/l surgery  . DM hyperosmolarity type II   . Hypertension   . TIA (transient ischemic attack)   . Anemia   . DJD (degenerative joint disease)   . DM neuropathies   . Renal insufficiency    Past Surgical History  Procedure Laterality Date  . Mastectomy partial / lumpectomy w/ axillary lymphadenectomy  1992, 1995    S/P left lumpectomy and axillary lymph node dissection December 1992 for a T1 N0 medullary breast cancer, no lymph node involvement, treated with tamoxifen for 2 years; S/P right lumpectomy and axillary lymph node dissection August 1995 for a T1 N0 microinvasive breast cancer, treated with Arimidex for 5 years.  Patient is followed by Dr. [name] at the cancer center.  . Lung lobectomy  06/13/2001    S/P right lower lobe lobectomy 06/13/2001 by Dr. [name]  . Pacemaker insertion  04/20/2001  . Mastectomy partial / lumpectomy    .  Lobectomy    . Pacemaker insertion    . Cholecystectomy    . Tonsillectomy    . Port  a  cath     Family History  Problem Relation Age of Onset  . Breast cancer Mother 43    Deceased September 25, 2002  . Diabetes type II Mother   . Heart attack Brother   . Cervical cancer Neg Hx   . Colon cancer Neg Hx   . Breast cancer Mother   . Diabetes Mellitus II Mother   . Hypertension Brother   . Coronary artery disease Brother   . Stroke Brother   . Diabetes type II Brother   . Hypertension Sister    History   Social History  . Marital Status: Widowed    Spouse Name: N/A    Number of Children: N/A  . Years of Education: N/A   Occupational History  . Not on file.   Social History Main Topics  . Smoking status: Former Smoker -- 1.00 packs/day for 15 years    Types: Cigarettes    Quit date: 04/19/2001  . Smokeless tobacco: Never Used  . Alcohol Use: No  . Drug Use: No  . Sexually  Active: No   Other Topics Concern  . Not on file   Social History Narrative   ** Merged History Encounter **        Review of Systems: A 10 point ROS was performed; pertinent positives and negatives were noted in the HPI. Pt is difficult to arouse. Information in primarily form ED physician and medical records.   Physical Exam: Blood pressure 121/46, pulse 86, temperature 97.8 F (36.6 C), temperature source Oral, resp. rate 19, SpO2 100.00%. General: Drowsy but somewhat arousable. Head: Normocephalic and atraumatic. C-collar in place.  Eyes: Pupils equal, round, and reactive to light.  Pupils pin point Lungs: Diffusely course, respiratory distress, using accessory muscles, on 3L nasal canula Heart: Regular rate, regular rhythm, no murmur, no gallop, and no rub.  Abdomen: Obese, soft, no guarding, no rebound tenderness, no organomegaly.  Msk: No joint swelling, warmth, or erythema.  Extremities: 2+ radial and DP pulses bilaterally. Trace BLE pitting edema Neurologic: Alert & oriented X3- but very drowsy, follows commands Skin: Right breast with bandage with dried sanguinous drainage   Lab results: Basic Metabolic Panel:  Recent Labs  29/56/21 1325  NA 139  K 4.4  CL 103  CO2 26  GLUCOSE 102*  BUN 28*  CREATININE 2.13*  CALCIUM 8.3*   Liver Function Tests:  Recent Labs  06/09/12 1325  AST 30  ALT 13  ALKPHOS 54  BILITOT <0.1*  PROT 7.6  ALBUMIN 2.6*    Recent Labs  06/09/12 1325  AMMONIA 26   CBC:  Recent Labs  06/09/12 1325  WBC 14.7*  NEUTROABS 9.9*  HGB 8.7*  HCT 31.3*  MCV 71.6*  PLT 446*    Recent Labs  06/09/12 1732  PROBNP 1443.0*   CBG:  Recent Labs  06/09/12 1310 06/09/12 1720  GLUCAP 104* 104*   Urine Drug Screen: Drugs of Abuse     Component Value Date/Time   LABOPIA NONE DETECTED 06/09/2012 1318   COCAINSCRNUR NONE DETECTED 06/09/2012 1318   LABBENZ NONE DETECTED 06/09/2012 1318   AMPHETMU NONE DETECTED 06/09/2012  1318   THCU NONE DETECTED 06/09/2012 1318   LABBARB NONE DETECTED 06/09/2012 1318    Alcohol Level:  Recent Labs  06/09/12 1325  ETH <11   Urinalysis:  Recent Labs  06/09/12 1318  COLORURINE AMBER*  LABSPEC 1.030  PHURINE 5.0  GLUCOSEU NEGATIVE  HGBUR NEGATIVE  BILIRUBINUR SMALL*  KETONESUR 15*  PROTEINUR 100*  UROBILINOGEN 1.0  NITRITE POSITIVE*  LEUKOCYTESUR SMALL*    Imaging results:  Dg Chest 2 View  06/09/2012  *RADIOLOGY REPORT*  Clinical Data: Altered mental status.  CHEST - 2 VIEW  Comparison: One-view chest 06/05/2012.  Findings: The Port-A-Cath and pacing wires are stable.  The right pleural effusion is reaccumulating following removal of the chest tube.  There appears to be loculated component.  Right basilar airspace disease is present.  While this may represent atelectasis, infection is not excluded.  Mild pulmonary vascular congestion and cardiac enlargement are stable.  IMPRESSION:  1.  Reaccumulating right pleural effusion.  There may be a loculated component. 2.  Worsening right lower lobe airspace disease.  Infection is not excluded. 3.  Stable pulmonary vascular congestion.   Original Report Authenticated By: Marin Roberts, M.D.    Ct Head Wo Contrast  06/09/2012  *RADIOLOGY REPORT*  Clinical Data:  Altered mental status  CT HEAD WITHOUT CONTRAST CT CERVICAL SPINE WITHOUT CONTRAST  Technique:  Multidetector CT imaging of the head and cervical spine was performed following the standard protocol without intravenous contrast.  Multiplanar CT image reconstructions of the cervical spine were also generated.  Comparison:  03/28/2012  CT HEAD  Findings: No skull fracture is noted.  Paranasal sinuses and mastoid air cells are unremarkable.  Mild cerebral atrophy is stable.  No intracranial hemorrhage, mass effect or midline shift.  No acute infarction.  No mass lesion is noted on this unenhanced scan.  IMPRESSION: No acute intracranial abnormality.  Mild cerebral  atrophy.  CT CERVICAL SPINE  Findings: Axial images of the cervical spine shows no acute fracture or subluxation. Computer processed images shows no acute fracture or subluxation.  Mild degenerative changes C1-C2 articulation.  Mild anterior spurring lower endplate of the C4-C5 and C6 vertebral body.  Moderate anterior spurring lower endplate of the C7 vertebral body.  Mild disc space flattening at C5-C6 level.  There is sclerosis  and mixed lytic appearance of the C6 vertebral body.  This is highly suspicious for metastatic disease. Further evaluation with MRI is recommended.  Images of the lung apices shows partially loculated right pleural effusion.  Right apical scarring is noted.  No diagnostic pneumothorax.  IMPRESSION:  1.  No acute fracture or subluxation.  Degenerative changes as described above. 2.  Mixed lytic sclerotic appearance of the C6 vertebral body. This is highly suspicious for metastatic disease.  Further evaluation with MRI is recommended. 3.  Partially visualized loculated right pleural effusion.   Original Report Authenticated By: Natasha Mead, M.D.    Ct Cervical Spine Wo Contrast  06/09/2012  *RADIOLOGY REPORT*  Clinical Data:  Altered mental status  CT HEAD WITHOUT CONTRAST CT CERVICAL SPINE WITHOUT CONTRAST  Technique:  Multidetector CT imaging of the head and cervical spine was performed following the standard protocol without intravenous contrast.  Multiplanar CT image reconstructions of the cervical spine were also generated.  Comparison:  03/28/2012  CT HEAD  Findings: No skull fracture is noted.  Paranasal sinuses and mastoid air cells are unremarkable.  Mild cerebral atrophy is stable.  No intracranial hemorrhage, mass effect or midline shift.  No acute infarction.  No mass lesion is noted on this unenhanced scan.  IMPRESSION: No acute intracranial abnormality.  Mild cerebral atrophy.  CT CERVICAL SPINE  Findings: Axial images of the cervical  spine shows no acute fracture or  subluxation. Computer processed images shows no acute fracture or subluxation.  Mild degenerative changes C1-C2 articulation.  Mild anterior spurring lower endplate of the C4-C5 and C6 vertebral body.  Moderate anterior spurring lower endplate of the C7 vertebral body.  Mild disc space flattening at C5-C6 level.  There is sclerosis  and mixed lytic appearance of the C6 vertebral body.  This is highly suspicious for metastatic disease. Further evaluation with MRI is recommended.  Images of the lung apices shows partially loculated right pleural effusion.  Right apical scarring is noted.  No diagnostic pneumothorax.  IMPRESSION:  1.  No acute fracture or subluxation.  Degenerative changes as described above. 2.  Mixed lytic sclerotic appearance of the C6 vertebral body. This is highly suspicious for metastatic disease.  Further evaluation with MRI is recommended. 3.  Partially visualized loculated right pleural effusion.   Original Report Authenticated By: Natasha Mead, M.D.     Other results: EKG: Sinus rhythm, rate 85, normal axis.  Assessment & Plan by Problem: Patient is a 67 year old female with PMH of acute chronic respiratory failure, chronic diastolic CHF, history of right invasive ductal breast cancer, carcinoid of the lung, COPD, HTN, DM, OSA, pleural effusion, right DVT (05/2012), and C. Diff (05/2012) presents with altered mental status after falling in her child last night hitting the back of her head.   1. Right pleural effusion: Bilateral pleural effusions first noted 12/2011 and were very small. Slow progressive increase in effusions since that time, R>L. S/p right chest tube, placed in the ICU on 2/7 by IR. Due to decreased output, it was removed on 2/17 without complication. Unfortunately the effusion has now returned. The big question is what is the cause of this effusion. Possible it is a malignant effusion related to her breast cancer. Peripheral smear and cytology on her last admission showed  no malignant cells, but fluid was from pleural vac and not directly from the pleural cavity. Differential also included tuberculous effusion, which was r/o by a negative Quantiferon gold.  She needs to have a VATs to determine exactly what is causing this effusion, but I'm not sure she would be a good candidate at this time- she was not in the past. She will likely need to have this new effusion drained, will consult IR for new chest tube. Also requesting cytology on the fluid. Dr. Sung Amabile with PCCM was consulted and is concerned that the effusion could be a hemothorax from her previous chest tube, as the site is oozing today. - IR for possible thoracentesis  - Pleural fluid cytology  2. Respiratory failure: She was admitted from the ED with AMS. Hypoxic in the ED and in respiratory distress.  Vanc, Zosyn, adn Levaquin were started for possible HCAP, and she was placed on bipap. PCCM consulted, as noted above. Dr. Sung Amabile does not believe this is an infectious process, and recommended stopping abx for HCAP. While she is currently on bipap, concern for further repiratory decline. PCCM to admit to the ICU. - D/c Vanc, Zosyn, and Levaquin  3. Diastolic CHF: ? volume overload on admission, Mild BLE edema present on admission and worsening pulmonary vascular congestion on CXR. ECHO performed on 05/25/12: EF 60 to 65%, grade 1 diastolic dysfx, PAS 104 mmHg. She was discharged home on 20mg  po Lasix daily; unfortunately her Cr has risen significantly since discharge, so further diuresis is likely not a good idea.  - Continue to check I/O's - daily weights -  daily BMP  4. RLE DVT: Seen on BLE duplex on 2/7. She was placed on Lovenox prior to her recent hospital discharge, which has shown improved long-term survival in cancer pts requiring long-term anticoagulation.  - Lovenox BID   5. C. diff infection: Possibly caused by antibiotics for HCAP during her previous admission. Stool culture positive on 2/11.  She was started on Flagyl for C.diff treatment 2/11 with plans to continue for 14 days, on day 8 of 14 on the day of discharge.  - Cont Flagyl, day 11/14   6. R invasive ductal carcinoma: Being treated by Dr. [name]. The Herceptin was discontinued due to concerns for possible CHF. She is currently on Fulvestrant, but misses her last dose while in the hospital during her previous admission. Dr. [name] wanted her to schedule a f/u appt once she was out of the hospital; he stated that missing the dose of the Fulvestrant for a week would not negatively affect the patient. I am not sure if she has followed up with him yet; there is no note in the system.  7. AKI: During her previous hospitalization, her Cr began to increase around HD#3, with a peak Cr of 2.47. This did resolve, and her Cr was 0.89 at discharge on 2/18. She was sent home on her regular Lasix to 20mg  daily. Unfortunately, on this admission, in the ED, her Cr was 2.13 with a GFR of 27.  - Will lightly hydrate with 50cc NS/hr, as to not overload her.   8. Urinary tract infection: Increased Cr on admission. UA positive for nitrites, many bacteria. Given a dose of Levaquin and Zosyn in the ED. Urine culture pending. Holding all abx for now.   9. Anemia: H/o of anemia of chronic disease. On the day of her previous hospital discharge, Hbg 9.2. Today 8.7. Concerns for possible bleed from previous chest tube site. - AM CBC - Transfuse for Hbg <7  10. Acute encephalopathy: In the setting of respiratory distress and ARF. Reported improvement after presenting to the ED, but on examination, appeared more somnolent. - Minimize sedating medications.  11. DM: On Metformin and Januvia at home, which have been held on admission. CBGs appear controlled on admission.  - SSI   12. HTN: BP lower than previously on admission. On Amlodipine, Carvedilol, and Valsartan at home. Holding home BP meds.  13. CAD: On Crestor at home, which is being held. Other  meds, as noted above.  14. Depressed Mood: Celexa 10mg  daily was started last admission for depressed mood. Will hold for now.   Dispo: Disposition is deferred at this time, awaiting improvement of current medical problems. Anticipated discharge in approximately 2-3 day(s).   The patient does have a current PCP [name], MD), therefore will be requiring OPC follow-up after discharge.   The patient does not have transportation limitations that hinder transportation to clinic appointments.  Signed: Genelle Gather 06/09/2012, 7:02 PM

## 2012-06-10 ENCOUNTER — Inpatient Hospital Stay (HOSPITAL_COMMUNITY): Payer: Medicare Other

## 2012-06-10 DIAGNOSIS — R4182 Altered mental status, unspecified: Secondary | ICD-10-CM

## 2012-06-10 DIAGNOSIS — J449 Chronic obstructive pulmonary disease, unspecified: Secondary | ICD-10-CM

## 2012-06-10 DIAGNOSIS — N179 Acute kidney failure, unspecified: Secondary | ICD-10-CM

## 2012-06-10 DIAGNOSIS — G9340 Encephalopathy, unspecified: Secondary | ICD-10-CM

## 2012-06-10 DIAGNOSIS — J189 Pneumonia, unspecified organism: Secondary | ICD-10-CM

## 2012-06-10 LAB — GLUCOSE, CAPILLARY
Glucose-Capillary: 121 mg/dL — ABNORMAL HIGH (ref 70–99)
Glucose-Capillary: 91 mg/dL (ref 70–99)

## 2012-06-10 LAB — PROTIME-INR
INR: 1.27 (ref 0.00–1.49)
Prothrombin Time: 15.6 seconds — ABNORMAL HIGH (ref 11.6–15.2)

## 2012-06-10 LAB — BLOOD GAS, ARTERIAL
Bicarbonate: 22.9 mEq/L (ref 20.0–24.0)
Drawn by: 252031
O2 Saturation: 96.7 %
PEEP: 5 cmH2O
Patient temperature: 98.6
RATE: 24 resp/min
pH, Arterial: 7.36 (ref 7.350–7.450)
pO2, Arterial: 72.1 mmHg — ABNORMAL LOW (ref 80.0–100.0)

## 2012-06-10 LAB — COMPREHENSIVE METABOLIC PANEL
AST: 17 U/L (ref 0–37)
BUN: 32 mg/dL — ABNORMAL HIGH (ref 6–23)
CO2: 25 mEq/L (ref 19–32)
Calcium: 8 mg/dL — ABNORMAL LOW (ref 8.4–10.5)
Chloride: 106 mEq/L (ref 96–112)
Creatinine, Ser: 2.3 mg/dL — ABNORMAL HIGH (ref 0.50–1.10)
GFR calc non Af Amer: 21 mL/min — ABNORMAL LOW (ref >90)
Total Bilirubin: 0.1 mg/dL — ABNORMAL LOW (ref 0.3–1.2)

## 2012-06-10 LAB — APTT: aPTT: 52 seconds — ABNORMAL HIGH (ref 24–37)

## 2012-06-10 LAB — CBC
Hemoglobin: 7.6 g/dL — ABNORMAL LOW (ref 12.0–15.0)
Platelets: 380 10*3/uL (ref 150–400)
RBC: 3.81 MIL/uL — ABNORMAL LOW (ref 3.87–5.11)
WBC: 11.8 10*3/uL — ABNORMAL HIGH (ref 4.0–10.5)

## 2012-06-10 LAB — URINE CULTURE: Colony Count: NO GROWTH

## 2012-06-10 MED ORDER — PRO-STAT SUGAR FREE PO LIQD
30.0000 mL | Freq: Every day | ORAL | Status: DC
  Administered 2012-06-10 – 2012-06-11 (×7): 30 mL
  Filled 2012-06-10 (×10): qty 30

## 2012-06-10 MED ORDER — JEVITY 1.2 CAL PO LIQD
1000.0000 mL | ORAL | Status: DC
  Administered 2012-06-10: 1000 mL
  Filled 2012-06-10 (×3): qty 1000

## 2012-06-10 MED ORDER — HEPARIN (PORCINE) IN NACL 100-0.45 UNIT/ML-% IJ SOLN
1000.0000 [IU]/h | INTRAMUSCULAR | Status: DC
  Administered 2012-06-10: 1200 [IU]/h via INTRAVENOUS
  Filled 2012-06-10 (×2): qty 250

## 2012-06-10 MED ORDER — HEPARIN (PORCINE) IN NACL 100-0.45 UNIT/ML-% IJ SOLN
1500.0000 [IU]/h | INTRAMUSCULAR | Status: DC
  Administered 2012-06-10: 1500 [IU]/h via INTRAVENOUS
  Filled 2012-06-10 (×2): qty 250

## 2012-06-10 MED ORDER — NEPRO/CARBSTEADY PO LIQD
1000.0000 mL | ORAL | Status: DC
  Filled 2012-06-10: qty 1000

## 2012-06-10 MED ORDER — DEXTROSE 50 % IV SOLN
INTRAVENOUS | Status: AC
  Administered 2012-06-10: 50 mL via INTRAVENOUS
  Filled 2012-06-10: qty 50

## 2012-06-10 MED ORDER — BIOTENE DRY MOUTH MT LIQD
15.0000 mL | Freq: Four times a day (QID) | OROMUCOSAL | Status: DC
  Administered 2012-06-10 – 2012-06-11 (×6): 15 mL via OROMUCOSAL

## 2012-06-10 MED ORDER — ADULT MULTIVITAMIN LIQUID CH
5.0000 mL | Freq: Every day | ORAL | Status: DC
  Administered 2012-06-10 – 2012-06-12 (×3): 5 mL
  Filled 2012-06-10 (×5): qty 5

## 2012-06-10 MED ORDER — CHLORHEXIDINE GLUCONATE 0.12 % MT SOLN
15.0000 mL | Freq: Two times a day (BID) | OROMUCOSAL | Status: DC
  Administered 2012-06-10 – 2012-06-11 (×3): 15 mL via OROMUCOSAL
  Filled 2012-06-10 (×3): qty 15

## 2012-06-10 NOTE — Progress Notes (Addendum)
ANTICOAGULATION CONSULT NOTE - Initial Consult  Pharmacy Consult for Heparin Indication: VTE treatment  No Known Allergies  Patient Measurements: Height: 5\' 3"  (160 cm) Weight: 272 lb 14.9 oz (123.8 kg) IBW/kg (Calculated) : 52.4 Heparin Dosing Weight: 89 kg  Vital Signs: Temp: 100.2 F (37.9 C) (02/22 0400) Temp src: Oral (02/22 0400) BP: 92/45 mmHg (02/22 0600) Pulse Rate: 79 (02/22 0600)  Labs:  Recent Labs  06/09/12 1325 06/10/12 0630  HGB 8.7* 7.6*  HCT 31.3* 27.1*  PLT 446* 380  APTT  --  52*  LABPROT  --  15.6*  INR  --  1.27  CREATININE 2.13* 2.30*    Estimated Creatinine Clearance: 31.6 ml/min (by C-G formula based on Cr of 2.3).   Medical History: Past Medical History  Diagnosis Date  . CHF (congestive heart failure)      2-D echo 02/19/2009 showed left ventricle cavity size normal; systolic function normal, estimated ejection fraction 55%; wall motion normal; no regional wall motion abnormalities; PA peak pressure 47mm Hg.  [name] COPD (chronic obstructive pulmonary disease)     Pulmonary function tests on 08/05/2010 showed mixed obstructive and restrictive lung disease with no significant response to bronchodilator, and decreased diffusion capacity that corrects to normal range when adjusted for the inhaled alveolar volume.  . Degenerative joint disease   . AV block, complete     S/P dual-chamber for symptomatic episodes of bradycardia and complete heart block.  . Pacemaker     S/P dual-chamber for symptomatic episodes of bradycardia and complete heart block.  . Adenomatous polyp of colon     Tubular adenomas X 2 found 06/1999; negative screening colonoscopy 02/13/2004 by Dr. [name].  . Hypercholesteremia   . Obstructive sleep apnea     Baseline diagnostic nocturnal polysomnogram on July 27, 2005 showed severe obstructive sleep apnea/hypopnea syndrome, with AHI 153.2 per hour.  Last nocturnal polysomnogram done for CPAP titration was 05/01/2009;  CPAP was titrated to 13 CWP, AHI 1.8 per hour using a large Respironics FitLife Full Face Mask with heated humidifier.  . Peripheral autonomic neuropathy due to diabetes mellitus   . 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 [name]  . Constipation   . Hand pain   . Palpitation   . Rib pain     right sided   . DM type 2 (diabetes mellitus, type 2)   . Skin rash   . Urinary incontinence   . Proliferative diabetic retinopathy(362.02)     S/P panretinal photocoagulation by Dr. [name]  . Vitreous hemorrhage     With tractional detachment, left eye; S/P posterior vitrectomy with membrane peel by Dr. [name] 04/06/2005  . Dyspnea on exertion     Cardiac cath 06/17/2005 by Dr. [name] showed LVEF 50-55%, 30% proximal LAD stenosis, small diagonals.   . Shingles rash 03/25/11  . 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 Followed by Dr. [name].  . Breast cancer, stage 3 102/13    right hx lumpectomy 1994, bx today=invasive ductal ca  . Allergy     thinitis  .  Asthma   . Bronchitis     hx  . Cataract     b/l surgery  . DM hyperosmolarity type II   . Hypertension   . TIA (transient ischemic attack)   . Anemia   . DJD (degenerative joint disease)   . DM neuropathies   . Renal insufficiency     Assessment: AMS, respiratory failure, worsening effusion, bleeding from chest tube  PMH: dCHF, CHB with dual-chamber PPM, COPD, DJD, PM, HLD, OSA, peripheral neuropathy, DM, diabetic retinopathy, breast ca, asthma, CKD, HTN, TIA, anemia  AC: RLE DVT - on Lovenox 120 q12h PTA. hold per conversation with eLink  MD pending drainage of pleural effusion in IR. Hgb donw to 7.6 with recently bleeding from chest tube noted by MD 2/21. Heparin dosing weight 89 kg  ID: Vanc D#2 for possible HCAP vs malignant finding. - Tmax 100.2, WBC 11.8 down, UCx sent. CT 2/21 highly suspicious for metastic dz in C6 vertebral body. Recent h/o Cdiff noted. No abx currently.  CV: CHF, HLD. 92/45, HR 79. EF 60-65% on 05/25/12.  Endo: DM. CBG 67-314. Low 67 this am.  GI/Nutr: IV PPI, Nepro TF.  Neuro: Sedated on the vent.  Renal: ARF with Scr up t 2.3.  Pulm: VDRF. potential VATS and Pleux cath placement in future  Heme/Onc: Invasive ductal breast cancer on Fulvestrant. RLL carcinoid.  Goal of Therapy:  Heparin level 0.3-0.7 units/ml Monitor platelets by anticoagulation protocol: Yes   Plan:  Start IV heparin (no bolus) at 1500 units/hr. Will check heparin level in 8 hrs and daily.  Merilynn Finland, [employer] 06/10/2012,8:58 AM

## 2012-06-10 NOTE — Progress Notes (Signed)
INITIAL NUTRITION ASSESSMENT  DOCUMENTATION CODES Per approved criteria  -Morbid Obesity   INTERVENTION:  Jevity 1.2 formula at 30 ml/hr with Prostat liquid protein 30 ml 5 times daily to provide 1364 total kcals (66% of estimated kcal needs), 115 gm protein (100% of estimated protein needs), 581 ml of free water  Liquid MVI daily via tube RD to follow for nutrition care plan  NUTRITION DIAGNOSIS: Inadequate oral intake related to inability to eat as evidenced by NPO status  Goal: EN to provide 60-70% of estimated calorie needs (22-25 kcals/kg ideal body weight) and 100% of estimated protein needs, based on ASPEN guidelines for permissive underfeeding in critically ill obese individuals  Monitor:  EN regimen & tolerance, respiratory status, weight, labs, I/O's  Reason for Assessment: Consult, Low Braden  67 y.o. female  Admitting Dx: Respiratory distress  ASSESSMENT: Patient with PMH of invasive ductal breast cancer, carcinoid of RLL, diastolic heart failure and COPD presented to [facility] ED after multiple falls at home, increasing shortness of breath; found to have worsening of R pleural effusion.  Patient is currently intubated on ventilator support MV:  Temp: Propofol: 3.7 ml/hr ---> 98 fat kcals  RD consulted for EN initiation & management via Adult Enteral Nutrition Protocol.  Height: Ht Readings from Last 1 Encounters:  06/09/12 5\' 3"  (1.6 m)    Weight: Wt Readings from Last 1 Encounters:  06/09/12 272 lb 14.9 oz (123.8 kg)    Ideal Body Weight: 115 lb  % Ideal Body Weight: 236%  Wt Readings from Last 10 Encounters:  06/09/12 272 lb 14.9 oz (123.8 kg)  06/06/12 279 lb 12.8 oz (126.916 kg)  05/19/12 289 lb 3 oz (131.175 kg)  05/18/12 294 lb (133.358 kg)  05/16/12 293 lb 9.6 oz (133.176 kg)  04/25/12 289 lb 6.4 oz (131.271 kg)  04/06/12 277 lb (125.646 kg)  04/05/12 271 lb 14.4 oz (123.333 kg)  03/22/12 294 lb 3.2 oz (133.448 kg)  03/14/12 284 lb 9.6 oz  (129.094 kg)    Usual Body Weight: 279 lb  % Usual Body Weight: 97%  BMI:  Body mass index is 48.36 kg/(m^2).  Estimated Nutritional Needs: Kcal: 2050 Protein: 105-115 gm Fluid: 2.0-2.2 L  Skin: chest incision   Diet Order: NPO  EDUCATION NEEDS: -No education needs identified at this time   Intake/Output Summary (Last 24 hours) at 06/10/12 0901 Last data filed at 06/10/12 0601  Gross per 24 hour  Intake 687.55 ml  Output    150 ml  Net 537.55 ml    Labs:   Recent Labs Lab 06/06/12 0526 06/09/12 1325 06/10/12 0630  NA 141 139 140  K 4.0 4.4 4.4  CL 102 103 106  CO2 30 26 25   BUN 6 28* 32*  CREATININE 0.89 2.13* 2.30*  CALCIUM 9.6 8.3* 8.0*  GLUCOSE 194* 102* 75    CBG (last 3)   Recent Labs  06/10/12 0026 06/10/12 0359 06/10/12 0742  GLUCAP 153* 121* 67*    Scheduled Meds: . antiseptic oral rinse  15 mL Mouth Rinse QID  . antiseptic oral rinse  15 mL Mouth Rinse QID  . chlorhexidine  15 mL Mouth Rinse BID  . chlorhexidine  15 mL Mouth Rinse BID  . metronidazole  500 mg Intravenous Q8H  . pantoprazole (PROTONIX) IV  40 mg Intravenous Daily  . sodium chloride  3 mL Intravenous Q12H    Continuous Infusions: . sodium chloride 50 mL/hr at 06/09/12 2100  . feeding supplement (NEPRO  CARB STEADY)    . fentaNYL infusion INTRAVENOUS 50 mcg/hr (06/10/12 0601)  . propofol 5 mcg/kg/min (06/10/12 0700)    Past Medical History  Diagnosis Date  . CHF (congestive heart failure)      2-D echo 02/19/2009 showed left ventricle cavity size normal; systolic function normal, estimated ejection fraction 55%; wall motion normal; no regional wall motion abnormalities; PA peak pressure 47mm Hg.  [name] COPD (chronic obstructive pulmonary disease)     Pulmonary function tests on 08/05/2010 showed mixed obstructive and restrictive lung disease with no significant response to bronchodilator, and decreased diffusion capacity that corrects to normal range when adjusted for  the inhaled alveolar volume.  . Degenerative joint disease   . AV block, complete     S/P dual-chamber for symptomatic episodes of bradycardia and complete heart block.  . Pacemaker     S/P dual-chamber for symptomatic episodes of bradycardia and complete heart block.  . Adenomatous polyp of colon     Tubular adenomas X 2 found 06/1999; negative screening colonoscopy 02/13/2004 by Dr. [name].  . Hypercholesteremia   . Obstructive sleep apnea     Baseline diagnostic nocturnal polysomnogram on July 27, 2005 showed severe obstructive sleep apnea/hypopnea syndrome, with AHI 153.2 per hour.  Last nocturnal polysomnogram done for CPAP titration was 05/01/2009; CPAP was titrated to 13 CWP, AHI 1.8 per hour using a large Respironics FitLife Full Face Mask with heated humidifier.  . Peripheral autonomic neuropathy due to diabetes mellitus   . 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 [name]  . Constipation   . Hand pain   . Palpitation   . Rib pain     right sided   . DM type 2 (diabetes mellitus, type 2)   . Skin rash   . Urinary incontinence   . Proliferative diabetic retinopathy(362.02)     S/P panretinal photocoagulation by Dr. [name]  . Vitreous hemorrhage     With tractional detachment, left eye; S/P posterior vitrectomy with membrane peel by Dr. [name] 04/06/2005  . Dyspnea on exertion     Cardiac cath 06/17/2005 by Dr. [name] showed LVEF 50-55%, 30% proximal LAD stenosis, small diagonals.   . Shingles rash 03/25/11  . 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 Followed by Dr. [name].  .  Breast cancer, stage 3 102/13    right hx lumpectomy 1994, bx today=invasive ductal ca  . Allergy     thinitis  . Asthma   . Bronchitis     hx  . Cataract     b/l surgery  . DM hyperosmolarity type II   . Hypertension   . TIA (transient ischemic attack)   . Anemia   . DJD (degenerative joint disease)   . DM neuropathies   . Renal insufficiency     Past Surgical History  Procedure Laterality Date  . Mastectomy partial / lumpectomy w/ axillary lymphadenectomy  1992, 1995    S/P left lumpectomy and axillary lymph node dissection December 1992 for a T1 N0 medullary breast cancer, no lymph node involvement, treated with tamoxifen  for 2 years; S/P right lumpectomy and axillary lymph node dissection August 1995 for a T1 N0 microinvasive breast cancer, treated with Arimidex for 5 years.  Patient is followed by Dr. [name] at the cancer center.  . Lung lobectomy  06/13/2001    S/P right lower lobe lobectomy 06/13/2001 by Dr. [name]  . Pacemaker insertion  04/20/2001  . Mastectomy partial / lumpectomy    . Lobectomy    . Pacemaker insertion    . Cholecystectomy    . Tonsillectomy    . Port  a  cath      Maureen Chatters, [place], LDN Pager #: [phone] After-Hours Pager #: [phone]

## 2012-06-10 NOTE — H&P (Signed)
Patient has been seen and examined, resident note reviewed.  Patient is now in the ICU with PCCM management. Recently in for presumed HCAP.  At this time, to get managed for respiratory failure.  Chest tube in place and possible consideration of VATS.     No indication for antibiotics at this time in my assessment.  I agree to monitor for fever and hold antibiotics.

## 2012-06-10 NOTE — Procedures (Signed)
Intubation Procedure Note Melissa Villa [id number] 1945-09-02  Procedure: Intubation Indications: Respiratory insufficiency  Procedure Details Consent: Unable to obtain consent because of altered level of consciousness. Time Out: Verified patient identification, verified procedure, site/side was marked, verified correct patient position, special equipment/implants available, medications/allergies/relevent history reviewed, required imaging and test results available.  Performed  Medications used: etomidate and succinylcholine Equipment used: Glidescope Grade I View Correct placement confirmed by by auscultation, by CXR and ETCO2 monitor Tube secured at 23 cm at the lip  Evaluation Hemodynamic Status: BP stable throughout; O2 sats: stable throughout Patient's Current Condition: stable Complications: No apparent complications Patient did tolerate procedure well. Chest X-ray ordered to verify placement.  CXR: pending.   Overton Mam, M.D. Pulmonary and Critical Care Medicine Call E-link with questions [phone] 06/10/2012

## 2012-06-10 NOTE — Consult Note (Signed)
PULMONARY  / CRITICAL CARE MEDICINE  Name: Melissa Villa MRN: [id number] DOB: Jan 22, 1946    ADMISSION DATE:  06/09/2012 CONSULTATION DATE:  06/09/12   REFERRING MD :  IM TS  CHIEF COMPLAINT:  R Effusion, Resp Failure  BRIEF PATIENT DESCRIPTION: 66 y/o F with PMH of invasive ductal breast cancer, carcinoid of RLL, diastolic heart failure, chronic obstructive pulmonary disease, diabetes, and complete heart block requiring dual-chamber pacemaker who presented to [facility] on 2/21 after multiple falls at home, increasing shortness of breath. Found to have worsening of R pleural effusion.  Placed on BiPAP for support and intubated.   SIGNIFICANT EVENTS / STUDIES:  2/5-2/17 - admit for acute on chronic resp. Failure possible HCAP, right leg DVT, C.diff, AKI, and CT guided drainage of pleural effusion.  .......................................................................... 2/21 - Admit with resp failure, worsening effusion, bleeding from chest tube site 2/21 head CT - neg acute 2/21- neck CT- neg acute fx, Mixed lytic sclerotic appearance of the C6 vertebral body.This is highly suspicious for metastatic disease. Further evaluation with MRI is recommended.   LINES / TUBES: 2/22 ett>>> Porta cath>>> Chest tube 2/13>>>  CULTURES: Urine 2/22>>> MRSa pos BC 2/22>>>  ANTIBIOTICS: Vanco 2/21>>>2/21 Zosyn 2/21>>>2/21 Flagyl (hx of C-DifF)2/21 >>>   SUBJECTIVE: ett placed  VITAL SIGNS: Temp:  [97.8 F (36.6 C)-100.2 F (37.9 C)] 100.2 F (37.9 C) (02/22 0400) Pulse Rate:  [78-92] 79 (02/22 0600) Resp:  [13-25] 24 (02/22 0600) BP: (92-130)/(44-61) 92/45 mmHg (02/22 0600) SpO2:  [85 %-100 %] 97 % (02/22 0600) FiO2 (%):  [40 %-60 %] 40 % (02/22 0744) Weight:  [123.8 kg (272 lb 14.9 oz)] 123.8 kg (272 lb 14.9 oz) (02/21 2200)  PHYSICAL EXAMINATION: General:  Morbidly obese in NAd Neuro:  rass -1, not following commands HEENT: ett Cardiovascular:  s1s2 rrr, no m/r/g Lungs:  Reduced  left Abdomen:  Obese/soft, bsx4 active Musculoskeletal:  No acute deformities Skin:  Warm/dry, R breast with old small incision site from previous chest tube oozing blood, changes c/w radiation   Recent Labs Lab 06/06/12 0526 06/09/12 1325 06/10/12 0630  NA 141 139 140  K 4.0 4.4 4.4  CL 102 103 106  CO2 30 26 25   BUN 6 28* 32*  CREATININE 0.89 2.13* 2.30*  GLUCOSE 194* 102* 75    Recent Labs Lab 06/06/12 0526 06/09/12 1325 06/10/12 0630  HGB 9.2* 8.7* 7.6*  HCT 32.5* 31.3* 27.1*  WBC 9.4 14.7* 11.8*  PLT 321 446* 380   Dg Chest 2 View  06/09/2012  *RADIOLOGY REPORT*  Clinical Data: Altered mental status.  CHEST - 2 VIEW  Comparison: One-view chest 06/05/2012.  Findings: The Port-A-Cath and pacing wires are stable.  The right pleural effusion is reaccumulating following removal of the chest tube.  There appears to be loculated component.  Right basilar airspace disease is present.  While this may represent atelectasis, infection is not excluded.  Mild pulmonary vascular congestion and cardiac enlargement are stable.  IMPRESSION:  1.  Reaccumulating right pleural effusion.  There may be a loculated component. 2.  Worsening right lower lobe airspace disease.  Infection is not excluded. 3.  Stable pulmonary vascular congestion.   Original Report Authenticated By: Marin Roberts, M.D.    Ct Head Wo Contrast  06/09/2012  *RADIOLOGY REPORT*  Clinical Data:  Altered mental status  CT HEAD WITHOUT CONTRAST CT CERVICAL SPINE WITHOUT CONTRAST  Technique:  Multidetector CT imaging of the head and cervical spine was performed following the standard  protocol without intravenous contrast.  Multiplanar CT image reconstructions of the cervical spine were also generated.  Comparison:  03/28/2012  CT HEAD  Findings: No skull fracture is noted.  Paranasal sinuses and mastoid air cells are unremarkable.  Mild cerebral atrophy is stable.  No intracranial hemorrhage, mass effect or midline shift.   No acute infarction.  No mass lesion is noted on this unenhanced scan.  IMPRESSION: No acute intracranial abnormality.  Mild cerebral atrophy.  CT CERVICAL SPINE  Findings: Axial images of the cervical spine shows no acute fracture or subluxation. Computer processed images shows no acute fracture or subluxation.  Mild degenerative changes C1-C2 articulation.  Mild anterior spurring lower endplate of the C4-C5 and C6 vertebral body.  Moderate anterior spurring lower endplate of the C7 vertebral body.  Mild disc space flattening at C5-C6 level.  There is sclerosis  and mixed lytic appearance of the C6 vertebral body.  This is highly suspicious for metastatic disease. Further evaluation with MRI is recommended.  Images of the lung apices shows partially loculated right pleural effusion.  Right apical scarring is noted.  No diagnostic pneumothorax.  IMPRESSION:  1.  No acute fracture or subluxation.  Degenerative changes as described above. 2.  Mixed lytic sclerotic appearance of the C6 vertebral body. This is highly suspicious for metastatic disease.  Further evaluation with MRI is recommended. 3.  Partially visualized loculated right pleural effusion.   Original Report Authenticated By: Natasha Mead, M.D.    Ct Cervical Spine Wo Contrast  06/09/2012  *RADIOLOGY REPORT*  Clinical Data:  Altered mental status  CT HEAD WITHOUT CONTRAST CT CERVICAL SPINE WITHOUT CONTRAST  Technique:  Multidetector CT imaging of the head and cervical spine was performed following the standard protocol without intravenous contrast.  Multiplanar CT image reconstructions of the cervical spine were also generated.  Comparison:  03/28/2012  CT HEAD  Findings: No skull fracture is noted.  Paranasal sinuses and mastoid air cells are unremarkable.  Mild cerebral atrophy is stable.  No intracranial hemorrhage, mass effect or midline shift.  No acute infarction.  No mass lesion is noted on this unenhanced scan.  IMPRESSION: No acute intracranial  abnormality.  Mild cerebral atrophy.  CT CERVICAL SPINE  Findings: Axial images of the cervical spine shows no acute fracture or subluxation. Computer processed images shows no acute fracture or subluxation.  Mild degenerative changes C1-C2 articulation.  Mild anterior spurring lower endplate of the C4-C5 and C6 vertebral body.  Moderate anterior spurring lower endplate of the C7 vertebral body.  Mild disc space flattening at C5-C6 level.  There is sclerosis  and mixed lytic appearance of the C6 vertebral body.  This is highly suspicious for metastatic disease. Further evaluation with MRI is recommended.  Images of the lung apices shows partially loculated right pleural effusion.  Right apical scarring is noted.  No diagnostic pneumothorax.  IMPRESSION:  1.  No acute fracture or subluxation.  Degenerative changes as described above. 2.  Mixed lytic sclerotic appearance of the C6 vertebral body. This is highly suspicious for metastatic disease.  Further evaluation with MRI is recommended. 3.  Partially visualized loculated right pleural effusion.   Original Report Authenticated By: Natasha Mead, M.D.    Dg Chest Port 1 View  06/10/2012  *RADIOLOGY REPORT*  Clinical Data: The endotracheal tube placement.  PORTABLE CHEST - 1 VIEW  Comparison: 06/09/2012  Findings: Interval placement of an endotracheal tube with tip measuring 3.1 cm above the carina.  Otherwise, there is  no significant change.  Power port type left central venous catheter remains in place.  Cardiac pacemaker is unchanged.  Shallow inspiration with elevation of the right hemidiaphragm.  Small right pleural effusion with fluid in the minor fissure versus loculation. Cardiac enlargement.  Normal pulmonary vascularity.  Old right rib fractures.  Surgical clips in the axilla bilaterally.  IMPRESSION: Endotracheal tube placed with tip measuring 3.1 cm above the carina.  Otherwise no change.   Original Report Authenticated By: Burman Nieves, M.D.      ASSESSMENT / PLAN:   Acute Respiratory Failure - recent admit for ? HCAP vs malignant chest findings / neg cytology O2 Dependent COPD  OSA Pleural Effusion - chest tube from 2/7-2/17 placed by IR.  Peripheral smear/cytology with no malignant cells.  Concern for hemothorax with bleeding from old anterior chest tube site.  ? If communication to pleural space. Pulmonary HTN - PA on ECHO 104  Plan: -CVTS consult for potential VATS and Pleux cath placement in future -see ID -abg reviewed, keep same MV, consider sbt today, unlikely to extubate -O2 to keep saturations > 92% -DNR but ok for intubation -pcxr in am -may need bicarb as she has lost her bicarb buffer with ARF  HTN Chronic Diastolic CHF - ECHO 05/25/12 with nml systolic fxn, EF 96-04%, PA peak 104  Plan: -hold home anti-hypertensives (ARB) -monitor BP -even balance goals  Acute Renal Failure Normal renal fxn on d/c 2/18.  Admit wth 2.13 in setting of metformin / ARB.  Plan: -gentle hydration -follow up BMP in am -may need bicarb as lost her bicarb buffer, avoid alkalosis  Diabetes Mellitus - on metformin at baseline, may not be best choice given hx of renal insufficency  Plan: -CBG's Q4 -monitor for hypoglycemia -start early TF, borderline glu  Hx of Invasive Ductal Breast Cancer - followed by Dr. [name].  Previously was on Herceptin but was d/c'd out of concern for CHF.  Now on Fulvestrant.   Carcinoid RLL  R DVT - (05/2012) on full dose lovenox Anemia - no significant drop in Hgb   Plan: -hold lovenox for potential intervention by IR and ARF, if not done, restart heparin IV -f/u H/H in am  -consider tx if <7 %  Acute Encephalopathy - in setting of respiratory distress, acute renal failure Falls - multiple falls in recent months.  Generalized decline.    Plan: -fall precautions -supportive care, minimize sedating medications -CT neck will have to be addressed with oncology  / rad -wua  Hx of  Recent C-Diff Rule out Pulmonary Infection - low clinical suspicion for infectious pulmonary process.    Plan: -hold abx follow fever curve  Ccm time 30 min  Son updated  Mcarthur Rossetti. Tyson Alias, MD, FACP Pgr: [phone] [place] Pulmonary & Critical Care

## 2012-06-10 NOTE — Progress Notes (Signed)
ANTICOAGULATION CONSULT NOTE - Follow up Consult  Pharmacy Consult for Heparin Indication: VTE treatment  No Known Allergies  Patient Measurements: Height: 5\' 3"  (160 cm) Weight: 272 lb 14.9 oz (123.8 kg) IBW/kg (Calculated) : 52.4 Heparin Dosing Weight: 89 kg  Vital Signs: Temp: 99.1 F (37.3 C) (02/22 1157) Temp src: Oral (02/22 1157) BP: 121/52 mmHg (02/22 1900) Pulse Rate: 91 (02/22 1900)  Labs:  Recent Labs  06/09/12 1325 06/10/12 0630 06/10/12 1835  HGB 8.7* 7.6*  --   HCT 31.3* 27.1*  --   PLT 446* 380  --   APTT  --  52*  --   LABPROT  --  15.6*  --   INR  --  1.27  --   HEPARINUNFRC  --   --  1.14*  CREATININE 2.13* 2.30*  --     Estimated Creatinine Clearance: 31.6 ml/min (by C-G formula based on Cr of 2.3).   Medical History: Past Medical History  Diagnosis Date  . CHF (congestive heart failure)      2-D echo 02/19/2009 showed left ventricle cavity size normal; systolic function normal, estimated ejection fraction 55%; wall motion normal; no regional wall motion abnormalities; PA peak pressure 47mm Hg.  [name] COPD (chronic obstructive pulmonary disease)     Pulmonary function tests on 08/05/2010 showed mixed obstructive and restrictive lung disease with no significant response to bronchodilator, and decreased diffusion capacity that corrects to normal range when adjusted for the inhaled alveolar volume.  . Degenerative joint disease   . AV block, complete     S/P dual-chamber for symptomatic episodes of bradycardia and complete heart block.  . Pacemaker     S/P dual-chamber for symptomatic episodes of bradycardia and complete heart block.  . Adenomatous polyp of colon     Tubular adenomas X 2 found 06/1999; negative screening colonoscopy 02/13/2004 by Dr. [name].  . Hypercholesteremia   . Obstructive sleep apnea     Baseline diagnostic nocturnal polysomnogram on July 27, 2005 showed severe obstructive sleep apnea/hypopnea syndrome, with AHI 153.2  per hour.  Last nocturnal polysomnogram done for CPAP titration was 05/01/2009; CPAP was titrated to 13 CWP, AHI 1.8 per hour using a large Respironics FitLife Full Face Mask with heated humidifier.  . Peripheral autonomic neuropathy due to diabetes mellitus   . 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 [name]  . Constipation   . Hand pain   . Palpitation   . Rib pain     right sided   . DM type 2 (diabetes mellitus, type 2)   . Skin rash   . Urinary incontinence   . Proliferative diabetic retinopathy(362.02)     S/P panretinal photocoagulation by Dr. [name]  . Vitreous hemorrhage     With tractional detachment, left eye; S/P posterior vitrectomy with membrane peel by Dr. [name] 04/06/2005  . Dyspnea on exertion     Cardiac cath 06/17/2005 by Dr. [name] showed LVEF 50-55%, 30% proximal LAD stenosis, small diagonals.   . Shingles rash 03/25/11  . 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  Found to have invasive ductal carcinoma of the right breast in 10/  2013. Followed by Dr. [name].  . Breast cancer, stage 3 102/13    right hx lumpectomy 1994, bx today=invasive ductal ca  . Allergy     thinitis  . Asthma   . Bronchitis     hx  . Cataract     b/l surgery  . DM hyperosmolarity type II   . Hypertension   . TIA (transient ischemic attack)   . Anemia   . DJD (degenerative joint disease)   . DM neuropathies   . Renal insufficiency     Assessment: AMS, respiratory failure, worsening effusion, bleeding from chest tube  PMH: dCHF, CHB with dual-chamber PPM, COPD, DJD, PM, HLD, OSA, peripheral neuropathy, DM, diabetic retinopathy, breast ca, asthma, CKD, HTN, TIA,  anemia  AC: RLE DVT - on Lovenox 120 q12h PTA. hold per conversation with eLink MD pending drainage of pleural effusion in IR. Hgb down to 7.6 with recently bleeding from chest tube noted by MD 2/21. Heparin dosing weight 89 kg  7 hr level = 1.14 on IV heparin 1500 units/hr in this 67 y.o female on anticoagulation protocol for RLE DVT.  RN reports heparin drip was infusing through port-a-cath at time of blood draw for heparin level and blood drawn from left arm. (unable to draw blood from right arm due to blood clot). Thus heparin level should be accurate. Likely high due to previously on SQ Lovenox 120 mg q12h PTA,  ?when last lovenox dose was given.   Goal of Therapy:  Heparin level 0.3-0.7 units/ml Monitor platelets by anticoagulation protocol: Yes   Plan:  Hold heparin drip x 1 hour then restart at lower rate of 1200 units/hr.  Will check heparin level in 6  hrs and daily.  Noah Delaine, RPh Clinical Pharmacist Pager: [phone] 06/10/2012,7:53 PM

## 2012-06-11 ENCOUNTER — Inpatient Hospital Stay (HOSPITAL_COMMUNITY): Payer: Medicare Other

## 2012-06-11 DIAGNOSIS — J301 Allergic rhinitis due to pollen: Secondary | ICD-10-CM

## 2012-06-11 LAB — GLUCOSE, CAPILLARY
Glucose-Capillary: 168 mg/dL — ABNORMAL HIGH (ref 70–99)
Glucose-Capillary: 195 mg/dL — ABNORMAL HIGH (ref 70–99)
Glucose-Capillary: 244 mg/dL — ABNORMAL HIGH (ref 70–99)
Glucose-Capillary: 237 mg/dL — ABNORMAL HIGH (ref 70–99)

## 2012-06-11 LAB — BLOOD GAS, ARTERIAL
Acid-base deficit: 1.3 mmol/L (ref 0.0–2.0)
Bicarbonate: 22.1 mEq/L (ref 20.0–24.0)
FIO2: 0.4 %
MECHVT: 420 mL
Patient temperature: 98.6
TCO2: 23.1 mmol/L (ref 0–100)

## 2012-06-11 LAB — CLOSTRIDIUM DIFFICILE BY PCR: Toxigenic C. Difficile by PCR: NEGATIVE

## 2012-06-11 LAB — CBC WITH DIFFERENTIAL/PLATELET
Basophils Absolute: 0 10*3/uL (ref 0.0–0.1)
Basophils Relative: 0 % (ref 0–1)
Eosinophils Absolute: 0.2 10*3/uL (ref 0.0–0.7)
HCT: 27.7 % — ABNORMAL LOW (ref 36.0–46.0)
Hemoglobin: 7.8 g/dL — ABNORMAL LOW (ref 12.0–15.0)
Lymphs Abs: 2.5 10*3/uL (ref 0.7–4.0)
MCH: 19.8 pg — ABNORMAL LOW (ref 26.0–34.0)
MCHC: 28.2 g/dL — ABNORMAL LOW (ref 30.0–36.0)
MCV: 70.5 fL — ABNORMAL LOW (ref 78.0–100.0)
Monocytes Absolute: 1.4 10*3/uL — ABNORMAL HIGH (ref 0.1–1.0)
Neutro Abs: 6.5 10*3/uL (ref 1.7–7.7)
RDW: 25.2 % — ABNORMAL HIGH (ref 11.5–15.5)

## 2012-06-11 LAB — BASIC METABOLIC PANEL
BUN: 42 mg/dL — ABNORMAL HIGH (ref 6–23)
Chloride: 106 mEq/L (ref 96–112)
Creatinine, Ser: 1.72 mg/dL — ABNORMAL HIGH (ref 0.50–1.10)
Glucose, Bld: 147 mg/dL — ABNORMAL HIGH (ref 70–99)
Potassium: 3.7 mEq/L (ref 3.5–5.1)

## 2012-06-11 LAB — POCT I-STAT 3, ART BLOOD GAS (G3+)
Bicarbonate: 24.5 mEq/L — ABNORMAL HIGH (ref 20.0–24.0)
Patient temperature: 99
pH, Arterial: 7.338 — ABNORMAL LOW (ref 7.350–7.450)

## 2012-06-11 LAB — HEPARIN LEVEL (UNFRACTIONATED)
Heparin Unfractionated: 0.94 IU/mL — ABNORMAL HIGH (ref 0.30–0.70)
Heparin Unfractionated: 0.75 IU/mL — ABNORMAL HIGH (ref 0.30–0.70)

## 2012-06-11 LAB — URINE CULTURE: Colony Count: NO GROWTH

## 2012-06-11 MED ORDER — PREGABALIN 75 MG PO CAPS
200.0000 mg | ORAL_CAPSULE | Freq: Two times a day (BID) | ORAL | Status: DC
  Administered 2012-06-11 – 2012-06-21 (×20): 200 mg via ORAL
  Filled 2012-06-11 (×3): qty 4
  Filled 2012-06-11: qty 1
  Filled 2012-06-11 (×2): qty 4
  Filled 2012-06-11: qty 3
  Filled 2012-06-11 (×3): qty 4
  Filled 2012-06-11: qty 1
  Filled 2012-06-11: qty 4
  Filled 2012-06-11: qty 1
  Filled 2012-06-11: qty 4
  Filled 2012-06-11: qty 1
  Filled 2012-06-11 (×2): qty 4
  Filled 2012-06-11: qty 1
  Filled 2012-06-11: qty 3
  Filled 2012-06-11: qty 4
  Filled 2012-06-11: qty 3
  Filled 2012-06-11: qty 4
  Filled 2012-06-11: qty 1
  Filled 2012-06-11: qty 4

## 2012-06-11 MED ORDER — HEPARIN (PORCINE) IN NACL 100-0.45 UNIT/ML-% IJ SOLN
1550.0000 [IU]/h | INTRAMUSCULAR | Status: DC
  Administered 2012-06-12: 800 [IU]/h via INTRAVENOUS
  Administered 2012-06-13: 1100 [IU]/h via INTRAVENOUS
  Administered 2012-06-14 – 2012-06-16 (×2): 1300 [IU]/h via INTRAVENOUS
  Filled 2012-06-11 (×11): qty 250

## 2012-06-11 MED ORDER — INSULIN ASPART 100 UNIT/ML ~~LOC~~ SOLN
0.0000 [IU] | Freq: Every day | SUBCUTANEOUS | Status: DC
  Administered 2012-06-11 – 2012-06-12 (×2): 2 [IU] via SUBCUTANEOUS
  Administered 2012-06-13: 3 [IU] via SUBCUTANEOUS

## 2012-06-11 MED ORDER — FENTANYL CITRATE 0.05 MG/ML IJ SOLN
INTRAMUSCULAR | Status: AC
  Administered 2012-06-11: 50 ug
  Filled 2012-06-11: qty 2

## 2012-06-11 MED ORDER — INSULIN ASPART 100 UNIT/ML ~~LOC~~ SOLN
0.0000 [IU] | Freq: Three times a day (TID) | SUBCUTANEOUS | Status: DC
  Administered 2012-06-12: 11 [IU] via SUBCUTANEOUS
  Administered 2012-06-12: 3 [IU] via SUBCUTANEOUS
  Administered 2012-06-12: 5 [IU] via SUBCUTANEOUS
  Administered 2012-06-13: 8 [IU] via SUBCUTANEOUS
  Administered 2012-06-13: 5 [IU] via SUBCUTANEOUS
  Administered 2012-06-13: 3 [IU] via SUBCUTANEOUS
  Administered 2012-06-14: 5 [IU] via SUBCUTANEOUS
  Administered 2012-06-14 (×2): 3 [IU] via SUBCUTANEOUS
  Administered 2012-06-15: 8 [IU] via SUBCUTANEOUS
  Administered 2012-06-15: 3 [IU] via SUBCUTANEOUS
  Administered 2012-06-15: 15 [IU] via SUBCUTANEOUS
  Administered 2012-06-16 (×2): 5 [IU] via SUBCUTANEOUS
  Administered 2012-06-16: 3 [IU] via SUBCUTANEOUS
  Administered 2012-06-17: 8 [IU] via SUBCUTANEOUS
  Administered 2012-06-17 (×2): 3 [IU] via SUBCUTANEOUS
  Administered 2012-06-18: 2 [IU] via SUBCUTANEOUS
  Administered 2012-06-18 – 2012-06-19 (×2): 3 [IU] via SUBCUTANEOUS
  Administered 2012-06-19: 2 [IU] via SUBCUTANEOUS
  Administered 2012-06-19 – 2012-06-20 (×2): 3 [IU] via SUBCUTANEOUS
  Administered 2012-06-20: 8 [IU] via SUBCUTANEOUS
  Administered 2012-06-20 – 2012-06-21 (×2): 2 [IU] via SUBCUTANEOUS
  Administered 2012-06-21: 1 [IU] via SUBCUTANEOUS

## 2012-06-11 MED ORDER — ALBUTEROL SULFATE (5 MG/ML) 0.5% IN NEBU
2.5000 mg | INHALATION_SOLUTION | RESPIRATORY_TRACT | Status: DC
  Administered 2012-06-11 – 2012-06-15 (×23): 2.5 mg via RESPIRATORY_TRACT
  Filled 2012-06-11 (×24): qty 0.5

## 2012-06-11 MED ORDER — IPRATROPIUM BROMIDE 0.02 % IN SOLN
0.5000 mg | RESPIRATORY_TRACT | Status: DC
  Administered 2012-06-11 – 2012-06-15 (×23): 0.5 mg via RESPIRATORY_TRACT
  Filled 2012-06-11 (×25): qty 2.5

## 2012-06-11 MED ORDER — TRAMADOL HCL 50 MG PO TABS
50.0000 mg | ORAL_TABLET | Freq: Four times a day (QID) | ORAL | Status: DC | PRN
  Administered 2012-06-11: 50 mg via ORAL
  Administered 2012-06-12: 200 mg via ORAL
  Administered 2012-06-12 – 2012-06-19 (×6): 50 mg via ORAL
  Filled 2012-06-11 (×7): qty 1

## 2012-06-11 MED ORDER — ALBUTEROL SULFATE (5 MG/ML) 0.5% IN NEBU
2.5000 mg | INHALATION_SOLUTION | RESPIRATORY_TRACT | Status: DC | PRN
  Filled 2012-06-11: qty 0.5

## 2012-06-11 MED ORDER — CHLORHEXIDINE GLUCONATE 0.12 % MT SOLN
15.0000 mL | Freq: Two times a day (BID) | OROMUCOSAL | Status: DC
Start: 2012-06-11 — End: 2012-06-21
  Administered 2012-06-11 – 2012-06-21 (×14): 15 mL via OROMUCOSAL
  Filled 2012-06-11 (×17): qty 15

## 2012-06-11 MED ORDER — IPRATROPIUM BROMIDE 0.02 % IN SOLN: 0.5000 mg | RESPIRATORY_TRACT | Status: DC | PRN

## 2012-06-11 MED ORDER — BIOTENE DRY MOUTH MT LIQD
15.0000 mL | Freq: Two times a day (BID) | OROMUCOSAL | Status: DC
  Administered 2012-06-12 – 2012-06-21 (×16): 15 mL via OROMUCOSAL

## 2012-06-11 NOTE — Progress Notes (Signed)
ANTICOAGULATION CONSULT NOTE  Pharmacy Consult for Heparin Indication: VTE treatment  No Known Allergies  Patient Measurements: Height: 5\' 3"  (160 cm) Weight: 273 lb 13 oz (124.2 kg) IBW/kg (Calculated) : 52.4 Heparin Dosing Weight: 89 kg  Vital Signs: Temp: 98.2 F (36.8 C) (02/23 2006) Temp src: Oral (02/23 2006) BP: 152/60 mmHg (02/23 2035) Pulse Rate: 93 (02/23 2035)  Labs:  Recent Labs  06/09/12 1325 06/10/12 0630  06/11/12 0519 06/11/12 0520 06/11/12 1325 06/11/12 2153  HGB 8.7* 7.6*  --   --  7.8*  --   --   HCT 31.3* 27.1*  --   --  27.7*  --   --   PLT 446* 380  --   --  378  --   --   APTT  --  52*  --   --   --   --   --   LABPROT  --  15.6*  --   --   --   --   --   INR  --  1.27  --   --   --   --   --   HEPARINUNFRC  --   --   < > 0.94*  --  0.75* 0.38  CREATININE 2.13* 2.30*  --   --  1.72*  --   --   < > = values in this interval not displayed.  Estimated Creatinine Clearance: 42.3 ml/min (by C-G formula based on Cr of 1.72).   Assessment: 67 y.o. female with recent DVT, awaiting pleural effusion drainage, for heparin  Goal of Therapy:  Heparin level 0.3-0.7 units/ml Monitor platelets by anticoagulation protocol: Yes   Plan:  Continue Heparin at current rate  Geannie Risen, PharmD, BCPS   06/11/2012,10:32 PM

## 2012-06-11 NOTE — Progress Notes (Signed)
ANTICOAGULATION CONSULT NOTE - Follow up Consult  Pharmacy Consult for Heparin Indication: VTE treatment  No Known Allergies  Patient Measurements: Height: 5\' 3"  (160 cm) Weight: 273 lb 13 oz (124.2 kg) IBW/kg (Calculated) : 52.4 Heparin Dosing Weight: 89 kg  Vital Signs: Temp: 98.7 F (37.1 C) (02/23 0407) Temp src: Oral (02/23 0407) BP: 120/48 mmHg (02/22 2300) Pulse Rate: 89 (02/22 2300)  Labs:  Recent Labs  06/09/12 1325 06/10/12 0630 06/10/12 1835 06/11/12 0519 06/11/12 0520  HGB 8.7* 7.6*  --   --  7.8*  HCT 31.3* 27.1*  --   --  27.7*  PLT 446* 380  --   --  378  APTT  --  52*  --   --   --   LABPROT  --  15.6*  --   --   --   INR  --  1.27  --   --   --   HEPARINUNFRC  --   --  1.14* 0.94*  --   CREATININE 2.13* 2.30*  --   --   --     Estimated Creatinine Clearance: 31.6 ml/min (by C-G formula based on Cr of 2.3).   Medical History: Past Medical History  Diagnosis Date  . CHF (congestive heart failure)      2-D echo 02/19/2009 showed left ventricle cavity size normal; systolic function normal, estimated ejection fraction 55%; wall motion normal; no regional wall motion abnormalities; PA peak pressure 47mm Hg.  [name] COPD (chronic obstructive pulmonary disease)     Pulmonary function tests on 08/05/2010 showed mixed obstructive and restrictive lung disease with no significant response to bronchodilator, and decreased diffusion capacity that corrects to normal range when adjusted for the inhaled alveolar volume.  . Degenerative joint disease   . AV block, complete     S/P dual-chamber for symptomatic episodes of bradycardia and complete heart block.  . Pacemaker     S/P dual-chamber for symptomatic episodes of bradycardia and complete heart block.  . Adenomatous polyp of colon     Tubular adenomas X 2 found 06/1999; negative screening colonoscopy 02/13/2004 by Dr. [name].  . Hypercholesteremia   . Obstructive sleep apnea     Baseline diagnostic  nocturnal polysomnogram on July 27, 2005 showed severe obstructive sleep apnea/hypopnea syndrome, with AHI 153.2 per hour.  Last nocturnal polysomnogram done for CPAP titration was 05/01/2009; CPAP was titrated to 13 CWP, AHI 1.8 per hour using a large Respironics FitLife Full Face Mask with heated humidifier.  . Peripheral autonomic neuropathy due to diabetes mellitus   . 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 [name]  . Constipation   . Hand pain   . Palpitation   . Rib pain     right sided   . DM type 2 (diabetes mellitus, type 2)   . Skin rash   . Urinary incontinence   . Proliferative diabetic retinopathy(362.02)     S/P panretinal photocoagulation by Dr. [name]  . Vitreous hemorrhage     With tractional detachment, left eye; S/P posterior vitrectomy with membrane peel by Dr. [name] 04/06/2005  . Dyspnea on exertion     Cardiac cath 06/17/2005 by Dr. [name] showed LVEF 50-55%, 30% proximal LAD stenosis, small diagonals.   . Shingles rash 03/25/11  . Breast cancer     S/P left lumpectomy and axillary node dissection 03/1991 for a T1 N0 medullary breast cancer, no lymph  node involvement, treated with tamoxifen for 2 years; S/P right lumpectomy and axillary lymph node dissection August 1995 for a T1 N0 microinvasive breast cancer, treated with Arimidex for 5 years.  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 Followed by Dr. [name].  . Breast cancer, stage 3 102/13    right hx lumpectomy 1994, bx today=invasive ductal ca  . Allergy     thinitis  . Asthma   . Bronchitis     hx  . Cataract     b/l surgery  . DM hyperosmolarity type II   . Hypertension   . TIA (transient ischemic attack)   . Anemia   . DJD (degenerative joint disease)   . DM neuropathies   . Renal insufficiency     Assessment: AMS, respiratory failure, worsening effusion, bleeding from chest tube  PMH: dCHF, CHB with dual-chamber  PPM, COPD, DJD, PM, HLD, OSA, peripheral neuropathy, DM, diabetic retinopathy, breast ca, asthma, CKD, HTN, TIA, anemia  AC: RLE DVT - on Lovenox 120 q12h PTA. hold per conversation with eLink MD pending drainage of pleural effusion in IR. Hgb down to 7.6 with recently bleeding from chest tube noted by MD 2/21. Heparin dosing weight 89 kg  Heparin level (0.94) is above-goal on 1200 units/hr. No bleeding per RN.  Goal of Therapy:  Heparin level 0.3-0.7 units/ml Monitor platelets by anticoagulation protocol: Yes   Plan:  1. Decrease IV heparin to 1000 units/hr.  2. Heparin level in 8 hours.  Lorre Munroe, PharmD 06/11/2012,5:53 AM

## 2012-06-11 NOTE — Progress Notes (Signed)
ANTICOAGULATION CONSULT NOTE - Follow up Consult  Pharmacy Consult for Heparin Indication: VTE treatment  No Known Allergies  Patient Measurements: Height: 5\' 3"  (160 cm) Weight: 273 lb 13 oz (124.2 kg) IBW/kg (Calculated) : 52.4 Heparin Dosing Weight: 89 kg  Vital Signs: Temp: 98.3 F (36.8 C) (02/23 1137) Temp src: Oral (02/23 1137) BP: 137/53 mmHg (02/23 1200) Pulse Rate: 96 (02/23 1300)  Labs:  Recent Labs  06/09/12 1325 06/10/12 0630 06/10/12 1835 06/11/12 0519 06/11/12 0520 06/11/12 1325  HGB 8.7* 7.6*  --   --  7.8*  --   HCT 31.3* 27.1*  --   --  27.7*  --   PLT 446* 380  --   --  378  --   APTT  --  52*  --   --   --   --   LABPROT  --  15.6*  --   --   --   --   INR  --  1.27  --   --   --   --   HEPARINUNFRC  --   --  1.14* 0.94*  --  0.75*  CREATININE 2.13* 2.30*  --   --  1.72*  --     Estimated Creatinine Clearance: 42.3 ml/min (by C-G formula based on Cr of 1.72).   Medical History: Past Medical History  Diagnosis Date  . CHF (congestive heart failure)      2-D echo 02/19/2009 showed left ventricle cavity size normal; systolic function normal, estimated ejection fraction 55%; wall motion normal; no regional wall motion abnormalities; PA peak pressure 47mm Hg.  [name] COPD (chronic obstructive pulmonary disease)     Pulmonary function tests on 08/05/2010 showed mixed obstructive and restrictive lung disease with no significant response to bronchodilator, and decreased diffusion capacity that corrects to normal range when adjusted for the inhaled alveolar volume.  . Degenerative joint disease   . AV block, complete     S/P dual-chamber for symptomatic episodes of bradycardia and complete heart block.  . Pacemaker     S/P dual-chamber for symptomatic episodes of bradycardia and complete heart block.  . Adenomatous polyp of colon     Tubular adenomas X 2 found 06/1999; negative screening colonoscopy 02/13/2004 by Dr. [name].  . Hypercholesteremia    . Obstructive sleep apnea     Baseline diagnostic nocturnal polysomnogram on July 27, 2005 showed severe obstructive sleep apnea/hypopnea syndrome, with AHI 153.2 per hour.  Last nocturnal polysomnogram done for CPAP titration was 05/01/2009; CPAP was titrated to 13 CWP, AHI 1.8 per hour using a large Respironics FitLife Full Face Mask with heated humidifier.  . Peripheral autonomic neuropathy due to diabetes mellitus   . 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 [name]  . Constipation   . Hand pain   . Palpitation   . Rib pain     right sided   . DM type 2 (diabetes mellitus, type 2)   . Skin rash   . Urinary incontinence   . Proliferative diabetic retinopathy(362.02)     S/P panretinal photocoagulation by Dr. [name]  . Vitreous hemorrhage     With tractional detachment, left eye; S/P posterior vitrectomy with membrane peel by Dr. [name] 04/06/2005  . Dyspnea on exertion     Cardiac cath 06/17/2005 by Dr. [name] showed LVEF 50-55%, 30% proximal LAD stenosis, small diagonals.   . Shingles rash 03/25/11  . Breast  cancer     S/P left lumpectomy and axillary node dissection 03/1991 for a T1 N0 medullary breast cancer, no lymph node involvement, treated with tamoxifen for 2 years; S/P right lumpectomy and axillary lymph node dissection August 1995 for a T1 N0 microinvasive breast cancer, treated with Arimidex for 5 years.  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 Followed by Dr. [name].  . Breast cancer, stage 3 102/13    right hx lumpectomy 1994, bx today=invasive ductal ca  . Allergy     thinitis  . Asthma   . Bronchitis     hx  . Cataract     b/l surgery  . DM hyperosmolarity type II   . Hypertension   . TIA (transient ischemic attack)   . Anemia   . DJD (degenerative joint disease)   . DM neuropathies   . Renal insufficiency     Assessment: AMS, respiratory failure, worsening effusion, bleeding  from chest tube  PMH: dCHF, CHB with dual-chamber PPM, COPD, DJD, PM, HLD, OSA, peripheral neuropathy, DM, diabetic retinopathy, breast ca, asthma, CKD, HTN, TIA, anemia  AC: RLE DVT - on Lovenox 120 q12h PTA. hold per conversation with eLink MD pending drainage of pleural effusion in IR. Hgb down to 7.6 with recently bleeding from chest tube noted by MD 2/21. Heparin dosing weight 89 kg  Heparin level (0.75) is still slightly above-goal on 1000 units/hr. No bleeding per RN.  Goal of Therapy:  Heparin level 0.3-0.7 units/ml Monitor platelets by anticoagulation protocol: Yes   Plan:  1. Decrease IV heparin to 800 units/hr.  2. Heparin level in 6 hours.  Sheppard Coil, PharmD 06/11/2012,2:48 PM

## 2012-06-11 NOTE — Procedures (Signed)
Extubation Procedure Note  Patient Details:   Name: Melissa Villa DOB: 1945-06-04 MRN: [id number]   Airway Documentation:     Evaluation  O2 sats: stable throughout Complications: No apparent complications Patient did tolerate procedure well. Bilateral Breath Sounds: Clear;Diminished Suctioning: Airway Yes  Pt extubated to 5L  with sats of 93%.  RN at bedside, will continue to monitor.  Closson, Terie Purser 06/11/2012, 9:56 AM

## 2012-06-11 NOTE — Discharge Summary (Signed)
Quantiferon gold: Negative

## 2012-06-11 NOTE — Consult Note (Signed)
PULMONARY  / CRITICAL CARE MEDICINE  Name: Melissa Villa MRN: [id number] DOB: Apr 17, 1946    ADMISSION DATE:  06/09/2012 CONSULTATION DATE:  06/09/12   REFERRING MD :  IM TS  CHIEF COMPLAINT:  R Effusion, Resp Failure  BRIEF PATIENT DESCRIPTION: 67 y/o F with PMH of invasive ductal breast cancer, carcinoid of RLL, diastolic heart failure, chronic obstructive pulmonary disease, diabetes, and complete heart block requiring dual-chamber pacemaker who presented to [facility] on 2/21 after multiple falls at home, increasing shortness of breath. Found to have worsening of R pleural effusion.  Placed on BiPAP for support and intubated.   SIGNIFICANT EVENTS / STUDIES:  2/5-2/17 - admit for acute on chronic resp. Failure possible HCAP, right leg DVT, C.diff, AKI, and CT guided drainage of pleural effusion.  .......................................................................... 2/21 - Admit with resp failure, worsening effusion, bleeding from chest tube site 2/21 head CT - neg acute 2/21- neck CT- neg acute fx, Mixed lytic sclerotic appearance of the C6 vertebral body.This is highly suspicious for metastatic disease. Further evaluation with MRI is recommended. 2/22 renal US - neg hydro 2/23- redcution in effusion on pcxr  LINES / TUBES: 2/22 ett>>> Porta cath>>> Chest tube 2/13>>>out pre dc  CULTURES: Urine 2/22>>> MRSa pos BC 2/22>>> cdiff 2/23>>>  ANTIBIOTICS: Vanco 2/21>>>2/21 Zosyn 2/21>>>2/21 Flagyl (hx of C-DifF)2/21 >>>   SUBJECTIVE: ett placed  VITAL SIGNS: Temp:  [98.7 F (37.1 C)-100 F (37.8 C)] 99 F (37.2 C) (02/23 0806) Pulse Rate:  [78-91] 83 (02/23 0740) Resp:  [20-26] 20 (02/23 0740) BP: (108-142)/(43-64) 142/59 mmHg (02/23 0500) SpO2:  [96 %-100 %] 99 % (02/23 0740) FiO2 (%):  [40 %] 40 % (02/23 0740) Weight:  [124.2 kg (273 lb 13 oz)] 124.2 kg (273 lb 13 oz) (02/23 0500)  PHYSICAL EXAMINATION: General:  Morbidly obese in NAd Neuro:  rass -1, not following  commands HEENT: ett Cardiovascular:  s1s2 rrr, no m/r/g Lungs: Improved sounds on rt Abdomen:  Obese/soft, bsx4 active Musculoskeletal:  No acute deformities Skin:  Warm/dry, R breast with old small incision site from previous chest tube oozing blood, changes c/w radiation   Recent Labs Lab 06/09/12 1325 06/10/12 0630 06/11/12 0520  NA 139 140 140  K 4.4 4.4 3.7  CL 103 106 106  CO2 26 25 26   BUN 28* 32* 42*  CREATININE 2.13* 2.30* 1.72*  GLUCOSE 102* 75 147*    Recent Labs Lab 06/09/12 1325 06/10/12 0630 06/11/12 0520  HGB 8.7* 7.6* 7.8*  HCT 31.3* 27.1* 27.7*  WBC 14.7* 11.8* 10.6*  PLT 446* 380 378   Dg Chest 2 View  06/09/2012  *RADIOLOGY REPORT*  Clinical Data: Altered mental status.  CHEST - 2 VIEW  Comparison: One-view chest 06/05/2012.  Findings: The Port-A-Cath and pacing wires are stable.  The right pleural effusion is reaccumulating following removal of the chest tube.  There appears to be loculated component.  Right basilar airspace disease is present.  While this may represent atelectasis, infection is not excluded.  Mild pulmonary vascular congestion and cardiac enlargement are stable.  IMPRESSION:  1.  Reaccumulating right pleural effusion.  There may be a loculated component. 2.  Worsening right lower lobe airspace disease.  Infection is not excluded. 3.  Stable pulmonary vascular congestion.   Original Report Authenticated By: Marin Roberts, M.D.    Ct Head Wo Contrast  06/09/2012  *RADIOLOGY REPORT*  Clinical Data:  Altered mental status  CT HEAD WITHOUT CONTRAST CT CERVICAL SPINE WITHOUT CONTRAST  Technique:  Multidetector CT imaging of the head and cervical spine was performed following the standard protocol without intravenous contrast.  Multiplanar CT image reconstructions of the cervical spine were also generated.  Comparison:  03/28/2012  CT HEAD  Findings: No skull fracture is noted.  Paranasal sinuses and mastoid air cells are unremarkable.  Mild  cerebral atrophy is stable.  No intracranial hemorrhage, mass effect or midline shift.  No acute infarction.  No mass lesion is noted on this unenhanced scan.  IMPRESSION: No acute intracranial abnormality.  Mild cerebral atrophy.  CT CERVICAL SPINE  Findings: Axial images of the cervical spine shows no acute fracture or subluxation. Computer processed images shows no acute fracture or subluxation.  Mild degenerative changes C1-C2 articulation.  Mild anterior spurring lower endplate of the C4-C5 and C6 vertebral body.  Moderate anterior spurring lower endplate of the C7 vertebral body.  Mild disc space flattening at C5-C6 level.  There is sclerosis  and mixed lytic appearance of the C6 vertebral body.  This is highly suspicious for metastatic disease. Further evaluation with MRI is recommended.  Images of the lung apices shows partially loculated right pleural effusion.  Right apical scarring is noted.  No diagnostic pneumothorax.  IMPRESSION:  1.  No acute fracture or subluxation.  Degenerative changes as described above. 2.  Mixed lytic sclerotic appearance of the C6 vertebral body. This is highly suspicious for metastatic disease.  Further evaluation with MRI is recommended. 3.  Partially visualized loculated right pleural effusion.   Original Report Authenticated By: Natasha Mead, M.D.    Ct Cervical Spine Wo Contrast  06/09/2012  *RADIOLOGY REPORT*  Clinical Data:  Altered mental status  CT HEAD WITHOUT CONTRAST CT CERVICAL SPINE WITHOUT CONTRAST  Technique:  Multidetector CT imaging of the head and cervical spine was performed following the standard protocol without intravenous contrast.  Multiplanar CT image reconstructions of the cervical spine were also generated.  Comparison:  03/28/2012  CT HEAD  Findings: No skull fracture is noted.  Paranasal sinuses and mastoid air cells are unremarkable.  Mild cerebral atrophy is stable.  No intracranial hemorrhage, mass effect or midline shift.  No acute infarction.   No mass lesion is noted on this unenhanced scan.  IMPRESSION: No acute intracranial abnormality.  Mild cerebral atrophy.  CT CERVICAL SPINE  Findings: Axial images of the cervical spine shows no acute fracture or subluxation. Computer processed images shows no acute fracture or subluxation.  Mild degenerative changes C1-C2 articulation.  Mild anterior spurring lower endplate of the C4-C5 and C6 vertebral body.  Moderate anterior spurring lower endplate of the C7 vertebral body.  Mild disc space flattening at C5-C6 level.  There is sclerosis  and mixed lytic appearance of the C6 vertebral body.  This is highly suspicious for metastatic disease. Further evaluation with MRI is recommended.  Images of the lung apices shows partially loculated right pleural effusion.  Right apical scarring is noted.  No diagnostic pneumothorax.  IMPRESSION:  1.  No acute fracture or subluxation.  Degenerative changes as described above. 2.  Mixed lytic sclerotic appearance of the C6 vertebral body. This is highly suspicious for metastatic disease.  Further evaluation with MRI is recommended. 3.  Partially visualized loculated right pleural effusion.   Original Report Authenticated By: Natasha Mead, M.D.    US Renal Port  06/10/2012  *RADIOLOGY REPORT*  Clinical Data: Assess for hydronephrosis.  Renal insufficiency.  RENAL/URINARY TRACT ULTRASOUND COMPLETE  Comparison:  Abdominal CT 08/06/2009  Findings:  Right  Kidney:  Normal appearance of the right kidney without hydronephrosis.  Right kidney measures 10.9 cm length.  Left Kidney:  Left kidney measures 14.6 cm in length without hydronephrosis.  Bladder:  Urinary bladder is decompressed with a catheter.  IMPRESSION: Negative for hydronephrosis.   Original Report Authenticated By: Richarda Overlie, M.D.    Dg Chest Port 1 View  06/11/2012  *RADIOLOGY REPORT*  Clinical Data: Evaluate endotracheal tube.CHF.  COPD.  Diabetes.  PORTABLE CHEST - 1 VIEW  Comparison: 1 day prior  Findings:  Endotracheal tube 3.2 cm above carina.  Pacer leads right atrium right ventricle.  Nasogastric extends beyond the  inferior aspect of the film.  Left-sided Port-A-Cath terminating at the low SVC.  Borderline cardiomegaly.  Moderate hemidiaphragm elevation. Improved right-sided pleural fluid with decreased loculation laterally. No pneumothorax.  Mild pulmonary venous congestion. Persistent patchy right-sided airspace disease.  IMPRESSION: Decreased right-sided pleural fluid with loculation laterally.  Improved right-sided aeration with persistent airspace disease and volume loss remaining.  Mild pulmonary venous congestion.   Original Report Authenticated By: Jeronimo Greaves, M.D.    Dg Chest Port 1 View  06/10/2012  *RADIOLOGY REPORT*  Clinical Data: The endotracheal tube placement.  PORTABLE CHEST - 1 VIEW  Comparison: 06/09/2012  Findings: Interval placement of an endotracheal tube with tip measuring 3.1 cm above the carina.  Otherwise, there is no significant change.  Power port type left central venous catheter remains in place.  Cardiac pacemaker is unchanged.  Shallow inspiration with elevation of the right hemidiaphragm.  Small right pleural effusion with fluid in the minor fissure versus loculation. Cardiac enlargement.  Normal pulmonary vascularity.  Old right rib fractures.  Surgical clips in the axilla bilaterally.  IMPRESSION: Endotracheal tube placed with tip measuring 3.1 cm above the carina.  Otherwise no change.   Original Report Authenticated By: Burman Nieves, M.D.     ASSESSMENT / PLAN:   Acute Respiratory Failure - recent admit for ? HCAP vs malignant chest findings / neg cytology O2 Dependent COPD  OSA Pleural Effusion - chest tube from 2/7-2/17 placed by IR.  Peripheral smear/cytology with no malignant cells.  Concern for hemothorax with bleeding from old anterior chest tube site.  ? If communication to pleural space. Pulmonary HTN - PA on ECHO 104  Plan: -CVTS consult for  potential VATS and Pleux cath placement in future -abg reviewed, reduce rate to 16 if not extubated -wean cpap5 ps 5, goal 30 min , MS much improved for possible extubation -pcxr in am, improved effusion, on heparin unlikely hemothorax from fall -may need bicarb additional to re compensate lost bicarb buffer from renal failure  HTN Chronic Diastolic CHF - ECHO 05/25/12 with nml systolic fxn, EF 45-40%, PA peak 104  Plan: -hold home anti-hypertensives (ARB) -monitor BP -tele  Acute Renal Failure ARF resolving Admit wth 2.13 in setting of metformin / ARB.  Plan: -gentle hydration -follow up BMP in am -may need bicarb  Diabetes Mellitus - on metformin at baseline, may not be best choice given hx of renal insufficency  Plan: -CBG's Q4 -TF on   Hx of Invasive Ductal Breast Cancer - followed by Dr. [name].  Previously was on Herceptin but was d/c'd out of concern for CHF.  Now on Fulvestrant.   Carcinoid RLL  R DVT - (05/2012) on full dose lovenox Anemia - no significant drop in Hgb  No evidence hemothorax, pleural effusion reduced on heparin  Plan: -heparin drip maintain -no Lovenox in setting arf  Acute Encephalopathy - in setting of respiratory distress, acute renal failure Falls - multiple falls in recent months.  Generalized decline.    Plan: -fall precautions -CT neck will have to be addressed with oncology  / rad -wua  Hx of Recent C-Diff Rule out Pulmonary Infection - low clinical suspicion for infectious pulmonary process.    Plan: -hold abx follow fever curve -isolation  Ccm time 30 min    Mcarthur Rossetti. Tyson Alias, MD, FACP Pgr: [phone] [facility]

## 2012-06-12 ENCOUNTER — Ambulatory Visit: Payer: Self-pay | Admitting: Internal Medicine

## 2012-06-12 ENCOUNTER — Inpatient Hospital Stay (HOSPITAL_COMMUNITY): Payer: Medicare Other

## 2012-06-12 DIAGNOSIS — N179 Acute kidney failure, unspecified: Secondary | ICD-10-CM | POA: Diagnosis present

## 2012-06-12 LAB — CBC
MCH: 19.7 pg — ABNORMAL LOW (ref 26.0–34.0)
MCHC: 28 g/dL — ABNORMAL LOW (ref 30.0–36.0)
MCV: 70.3 fL — ABNORMAL LOW (ref 78.0–100.0)
Platelets: 328 10*3/uL (ref 150–400)
RDW: 24.7 % — ABNORMAL HIGH (ref 11.5–15.5)

## 2012-06-12 LAB — BLOOD GAS, ARTERIAL
Bicarbonate: 22.9 mEq/L (ref 20.0–24.0)
O2 Saturation: 97.2 %
Patient temperature: 98.6
TCO2: 24.7 mmol/L (ref 0–100)
pH, Arterial: 7.224 — ABNORMAL LOW (ref 7.350–7.450)
pO2, Arterial: 95.4 mmHg (ref 80.0–100.0)

## 2012-06-12 LAB — BASIC METABOLIC PANEL
BUN: 30 mg/dL — ABNORMAL HIGH (ref 6–23)
Creatinine, Ser: 1.06 mg/dL (ref 0.50–1.10)
GFR calc Af Amer: 63 mL/min — ABNORMAL LOW (ref >90)
GFR calc non Af Amer: 54 mL/min — ABNORMAL LOW (ref >90)
Glucose, Bld: 150 mg/dL — ABNORMAL HIGH (ref 70–99)

## 2012-06-12 LAB — GLUCOSE, CAPILLARY
Glucose-Capillary: 177 mg/dL — ABNORMAL HIGH (ref 70–99)
Glucose-Capillary: 308 mg/dL — ABNORMAL HIGH (ref 70–99)

## 2012-06-12 LAB — PRO B NATRIURETIC PEPTIDE: Pro B Natriuretic peptide (BNP): 637 pg/mL — ABNORMAL HIGH (ref 0–125)

## 2012-06-12 MED ORDER — MUPIROCIN CALCIUM 2 % EX CREA
TOPICAL_CREAM | Freq: Two times a day (BID) | CUTANEOUS | Status: AC
  Administered 2012-06-12 – 2012-06-16 (×10): via TOPICAL
  Filled 2012-06-12: qty 15

## 2012-06-12 MED ORDER — FENTANYL CITRATE 0.05 MG/ML IJ SOLN
12.5000 ug | INTRAMUSCULAR | Status: DC | PRN
Start: 2012-06-12 — End: 2012-06-20
  Administered 2012-06-12 – 2012-06-19 (×3): 50 ug via INTRAVENOUS
  Filled 2012-06-12 (×2): qty 2

## 2012-06-12 MED ORDER — FENTANYL CITRATE 0.05 MG/ML IJ SOLN
INTRAMUSCULAR | Status: AC
  Administered 2012-06-12: 50 ug
  Filled 2012-06-12: qty 2

## 2012-06-12 NOTE — Progress Notes (Signed)
UR Completed.  Melissa Villa Jane [phone] 06/12/2012  

## 2012-06-12 NOTE — Clinical Documentation Improvement (Signed)
DOCUMENTATION CLARIFICATION QUERY  THIS DOCUMENT IS NOT A PERMANENT PART OF THE MEDICAL RECORD         06/12/12  Dear Dr. Sung Amabile,  In an effort to better capture your patient's severity of illness, reflect appropriate length of stay and utilization of resources, a review of the patient medical record has revealed the following indicators.   Based on your clinical judgment, please clarify and document in a progress note and/or discharge summary the clinical condition associated with the following supporting information: In responding to this query please exercise your independent judgment.  The fact that a query is asked, does not imply that any particular answer is desired or expected.   Hello Dr. Sung Amabile!  First of all thank you for capturing the diagnosis of "morbid obesity" on this patient in your progress note!   Secondly, the medical record reflects the following clinical findings on admission; if possible, could you help by clarifying the clinical significance of these findings. Thank you!  Component      pH, Arterial pCO2 arterial pO2, Arterial  Latest Ref Rng      7.350 - 7.450 35.0 - 45.0 mmHg 80.0 - 100.0 mmHg  06/09/2012     5:32 PM 7.250 (L) 56.9 (H) 35.0 (LL)  06/09/2012     5:58 PM 7.234 (L) 59.6 (HH) 82.0  06/09/2012     8:45 PM 7.166 (LL) 67.7 (HH) 144.0 (H)  06/09/2012     11:59 PM 7.224 (L) 57.5 (HH) 95.4   Component      Bicarbonate  Latest Ref Rng      20.0 - 24.0 mEq/L  06/09/2012     5:32 PM 24.9 (H)  06/09/2012     5:58 PM 25.2 (H)  06/09/2012     8:45 PM 23.5  06/09/2012     11:59 PM 22.9   Possible Clinical Conditions?  - Respiratory Acidosis  - Metabolic Acidosis  - Other condition (please document in the progress notes and/or discharge summary)  - Cannot Clinically determine at this time     No additional documentation in chart upon review. SM    Thank You,  Saul Fordyce  Clinical Documentation Specialist: [phone]  Pager  [facility] Information [facility] [place]

## 2012-06-12 NOTE — Progress Notes (Signed)
Orthopedic Tech Progress Note Patient Details:  Melissa Villa 17-Jul-1945 [id number] Spoke with nurse and nurse stated that order for unna boots was a mistake. Doctor was trying to have provolone boots ordered for patient. Nurse to cancel unna boot order.  Patient ID: Melissa Villa, female   DOB: 04/05/1946, 67 y.o.   MRN: [id number]   Orie Rout 06/12/2012, 4:36 PM

## 2012-06-12 NOTE — Progress Notes (Signed)
[facility]  Patient Status: Active (receiving services up to time of hospitalization)  [facility] is providing the following services: RN, PT, MSW and HHA  If patient discharges after hours, please call [phone].   [facility] 06/12/2012, 11:20 AM

## 2012-06-12 NOTE — Progress Notes (Signed)
Inpatient Diabetes Program Recommendations  AACE/ADA: New Consensus Statement on Inpatient Glycemic Control (2013)  Target Ranges:  Prepandial:   less than 140 mg/dL      Peak postprandial:   less than 180 mg/dL (1-2 hours)      Critically ill patients:  140 - 180 mg/dL   Reason for Visit: Results for Melissa Villa, Melissa Villa (MRN [id number]) as of 06/12/2012 09:24  Ref. Range 06/11/2012 15:04 06/11/2012 20:00 06/11/2012 21:35 06/12/2012 00:09 06/12/2012 08:26  Glucose-Capillary Latest Range: 70-99 mg/dL 119 (H) 147 (H) 829 (H) 200 (H) 177 (H)   Note according to medication reconciliation, patient was taking Lantus 42 units daily prior to admission.  Consider restarting approximately 1/2 of home dose of Lantus (Lantus 20 units daily).  Also please change diet to CHO modified.   Will follow.  Thanks, Beryl Meager, RN, [phone] Diabetes Coordinator

## 2012-06-12 NOTE — Consult Note (Signed)
Subjective:   Melissa Villa is a 67 yo morbidly obese African American Female with PMH of invasive ductal breast cancer, Carcinoid of the RLL, COPD, Diastolic heart failure, and complete heart block S/P dual chamber pacemaker placement.  She presented to [facility] Emergency Department on 06/09/2012 after suffering multiple falls at home with a complaint of shortness of breath.  CXR showed the patient to have a right sided pleural effusion that was worsened from previous films and further evaluation revealed acute respiratory distress.  She was subsequently admitted to the patient ICU for further care.  The patient's respiratory declined further prompting intubation.  CT scan of head and neck were negative for acute fractures, however there was a mixed lytic lesion of the C6 vertebral body which was felt to be highly suspicious for metastatic disease.  The patients respiratory status has improved.  She was extubated on 06/11/2012.  Currently the patient is on Bipap.  She states she is felling better, but continues to experience shortness of breath.  We are being consulted for possible VATS.     Patient Active Problem List   Diagnosis Date Noted  . Altered mental status 06/09/2012  . HCAP (healthcare-associated pneumonia) 05/27/2012  . Right leg DVT 05/27/2012  . Acute-on-chronic respiratory failure 05/25/2012  . Cancer of breast, female 05/25/2012  . Acute encephalopathy 05/25/2012  . Pleural effusion, right 05/24/2012  . Respiratory distress 05/24/2012  . Pulmonary edema 02/18/2012  . Breast cancer, stage 3   . Cough 09/29/2011  . Pacemaker-St.Jude 11/03/2010  . Restrictive lung disease 10/07/2010  . INTERMITTENT CLAUDICATION, BILATERAL 10/23/2009  . ROTATOR CUFF SYNDROME, RIGHT 08/13/2009  . MICROALBUMINURIA 06/18/2009  . ALLERGIC RHINITIS, SEASONAL 04/26/2009  . Lower extremity pain 05/08/2008  . OSTEOPENIA 12/06/2007  . INSOMNIA 10/25/2007  . CHRONIC POSTTHORACOTOMY PAIN 03/30/2007  .  GERD 11/11/2006  . RETINOPATHY, DIABETIC, PROLIFERATIVE 04/30/2006  . HEMORRHAGE, VITREOUS 04/30/2006  . AV BLOCK, COMPLETE 04/30/2006  . SINUSITIS, CHRONIC 04/30/2006  . DYSPNEA ON EXERTION 04/30/2006  . URINARY INCONTINENCE 04/30/2006  . CARCINOID TUMOR, LUNG 03/07/2006  . DIABETES MELLITUS, TYPE II 03/07/2006  . HYPERCHOLESTEROLEMIA 03/07/2006  . ANEMIA-IRON DEFICIENCY 03/07/2006  . OBSTRUCTIVE SLEEP APNEA 03/07/2006  . PERIPHERAL NEUROPATHY 03/07/2006  . HYPERTENSION 03/07/2006  . CONGESTIVE HEART FAILURE 03/07/2006  . COPD 03/07/2006  . DEGENERATIVE JOINT DISEASE 03/07/2006  . RIB PAIN, RIGHT SIDED 03/07/2006  . COLONIC POLYPS, ADENOMATOUS, HX OF 07/06/1999   Past Medical History  Diagnosis Date  . CHF (congestive heart failure)      2-D echo 02/19/2009 showed left ventricle cavity size normal; systolic function normal, estimated ejection fraction 55%; wall motion normal; no regional wall motion abnormalities; PA peak pressure 47mm Hg.  [name] COPD (chronic obstructive pulmonary disease)     Pulmonary function tests on 08/05/2010 showed mixed obstructive and restrictive lung disease with no significant response to bronchodilator, and decreased diffusion capacity that corrects to normal range when adjusted for the inhaled alveolar volume.  . Degenerative joint disease   . AV block, complete     S/P dual-chamber for symptomatic episodes of bradycardia and complete heart block.  . Pacemaker     S/P dual-chamber for symptomatic episodes of bradycardia and complete heart block.  . Adenomatous polyp of colon     Tubular adenomas X 2 found 06/1999; negative screening colonoscopy 02/13/2004 by Dr. [name].  . Hypercholesteremia   . Obstructive sleep apnea     Baseline diagnostic nocturnal polysomnogram on July 27, 2005 showed  severe obstructive sleep apnea/hypopnea syndrome, with AHI 153.2 per hour.  Last nocturnal polysomnogram done for CPAP titration was 05/01/2009; CPAP was  titrated to 13 CWP, AHI 1.8 per hour using a large Respironics FitLife Full Face Mask with heated humidifier.  . Peripheral autonomic neuropathy due to diabetes mellitus   . 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 [name]  . Constipation   . Hand pain   . Palpitation   . Rib pain     right sided   . DM type 2 (diabetes mellitus, type 2)   . Skin rash   . Urinary incontinence   . Proliferative diabetic retinopathy(362.02)     S/P panretinal photocoagulation by Dr. [name]  . Vitreous hemorrhage     With tractional detachment, left eye; S/P posterior vitrectomy with membrane peel by Dr. [name] 04/06/2005  . Dyspnea on exertion     Cardiac cath 06/17/2005 by Dr. [name] showed LVEF 50-55%, 30% proximal LAD stenosis, small diagonals.   . Shingles rash 03/25/11  . 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 Followed by Dr. [name].  . Breast cancer, stage 3 102/13    right hx lumpectomy 1994, bx today=invasive ductal ca  . Allergy     thinitis  . Asthma   . Bronchitis     hx  . Cataract     b/l surgery  . DM hyperosmolarity type II   . Hypertension   . TIA (transient ischemic attack)   . Anemia   . DJD (degenerative joint disease)   . DM neuropathies   . Renal insufficiency     Past Surgical History  Procedure Laterality Date  . Mastectomy partial / lumpectomy w/ axillary lymphadenectomy  1992, 1995    S/P left lumpectomy and axillary lymph node dissection December 1992 for a T1 N0 medullary breast cancer, no lymph node involvement, treated with tamoxifen for 2 years; S/P right lumpectomy and  axillary lymph node dissection August 1995 for a T1 N0 microinvasive breast cancer, treated with Arimidex for 5 years.  Patient is followed by Dr. [name] at the cancer center.  . Lung lobectomy  06/13/2001    S/P right lower lobe lobectomy 06/13/2001 by Dr. [name]  . Pacemaker insertion  04/20/2001  . Mastectomy partial / lumpectomy    . Lobectomy    . Pacemaker insertion    . Cholecystectomy    . Tonsillectomy    . Port  a  cath      Prescriptions prior to admission  Medication Sig Dispense Refill  . albuterol (PROAIR HFA) 108 (90 BASE) MCG/ACT inhaler Inhale 2 puffs into the lungs 4 (four) times daily as needed for wheezing or shortness of breath.  1 Inhaler  11  . albuterol (PROVENTIL) (2.5 MG/3ML) 0.083% nebulizer solution Take 2.5 mg by nebulization every 6 (six) hours.      [name] amLODipine (NORVASC) 10 MG tablet Take 10 mg by mouth daily before breakfast.      . Bepotastine Besilate (BEPREVE) 1.5 % SOLN Place 1 drop into both eyes 2 (two) times daily.      [name]  budesonide (PULMICORT) 0.5 MG/2ML nebulizer solution Take 2 mLs (0.5 mg total) by nebulization 2 (two) times daily.  2 mL  3  . Calcium Carb-Cholecalciferol (CALCIUM + D3) 600-200 MG-UNIT TABS Take 1 tablet by mouth daily.      . carvedilol (COREG) 12.5 MG tablet Take 2 tablets (25 mg total) by mouth 2 (two) times daily.  120 tablet  6  . cetirizine (ZYRTEC) 10 MG tablet Take 10 mg by mouth daily.      . citalopram (CELEXA) 10 MG tablet Take 1 tablet (10 mg total) by mouth daily.  30 tablet  6  . Dexlansoprazole (DEXILANT) 60 MG capsule Take 60 mg by mouth every morning. Take 15 minutes before breakfast.      . enoxaparin (LOVENOX) 120 MG/0.8ML injection Inject 0.8 mLs (120 mg total) into the skin every 12 (twelve) hours.  60 Syringe  11  . Fluticasone-Salmeterol (ADVAIR) 250-50 MCG/DOSE AEPB Inhale 1 puff into the lungs every 12 (twelve) hours.      . furosemide (LASIX) 20 MG tablet Take 1 tablet (20 mg total) by mouth daily.   30 tablet  11  . insulin glargine (LANTUS) 100 UNIT/ML injection Inject 42 Units into the skin at bedtime.      . IRON PO Take 1 tablet by mouth daily.      [name] lidocaine-prilocaine (EMLA) cream Apply 1 application topically once. Before procedure      . metFORMIN (GLUCOPHAGE) 850 MG tablet Take 850 mg by mouth 3 (three) times daily.      . metroNIDAZOLE (FLAGYL) 500 MG tablet Take 1 tablet (500 mg total) by mouth every 8 (eight) hours.  15 tablet  0  . mometasone (NASONEX) 50 MCG/ACT nasal spray Place 2 sprays into the nose daily. scheduled      . Multiple Vitamin (MULTIVITAMIN WITH MINERALS) TABS Take 1 tablet by mouth daily.      [name] oxyCODONE (OXY IR/ROXICODONE) 5 MG immediate release tablet Take 1-2 tablets (5-10 mg total) by mouth every 6 (six) hours as needed for pain.  60 tablet  0  . pregabalin (LYRICA) 200 MG capsule Take 1 capsule (200 mg total) by mouth 2 (two) times daily.  60 capsule  1  . rosuvastatin (CRESTOR) 10 MG tablet Take 10 mg by mouth daily.      [name] tiotropium (SPIRIVA) 18 MCG inhalation capsule Place 1 capsule (18 mcg total) into inhaler and inhale daily.  30 capsule  11  . traMADol (ULTRAM) 50 MG tablet Take 50 mg by mouth every 6 (six) hours as needed. For pain      . valsartan (DIOVAN) 80 MG tablet Take 160 mg by mouth 2 (two) times daily.       No Known Allergies  History  Substance Use Topics  . Smoking status: Former Smoker -- 1.00 packs/day for 15 years    Types: Cigarettes    Quit date: 04/19/2001  . Smokeless tobacco: Never Used  . Alcohol Use: No    Family History  Problem Relation Age of Onset  . Breast cancer Mother 16    Deceased 09/08/02  . Diabetes type II Mother   . Heart attack Brother   . Cervical cancer Neg Hx   . Colon cancer Neg Hx   . Breast cancer Mother   . Diabetes Mellitus II Mother   . Hypertension Brother   . Coronary artery disease Brother   . Stroke Brother   . Diabetes type II Brother   .  Hypertension Sister      Objective:    Patient Vitals for the past 8 hrs:  Temp Temp src Pulse Resp SpO2 Weight  06/12/12 1131 - - 86 26 100 % -  06/12/12 1038 - - 95 28 100 % -  06/12/12 0858 - - - - 100 % -  06/12/12 0836 98.8 F (37.1 C) Oral - - - -  06/12/12 0500 - - - - - 277 lb 12.5 oz (126 kg)  06/12/12 0416 - - 92 26 95 % -  06/12/12 0415 - - - - 95 % -   I/O last 3 completed shifts: In: 3061.9 [I.V.:1891.9; NG/GT:670; IV Piggyback:500] Out: 2325 [Urine:2325]   BP 148/60  Pulse 86  Temp(Src) 98.8 F (37.1 C) (Oral)  Resp 26  Ht 5\' 3"  (1.6 m)  Wt 277 lb 12.5 oz (126 kg)  BMI 49.22 kg/m2  SpO2 100% General appearance: alert, cooperative and no distress Head: Normocephalic, without obvious abnormality, atraumatic Eyes: conjunctivae/corneas clear. PERRL, EOM's intact. Fundi benign. Lungs: wheezes bilaterally Heart: regular rate and rhythm Abdomen: soft, non-tender; bowel sounds normal; no masses,  no organomegaly Skin: Skin color, texture, turgor normal. No rashes or lesions Neurologic: Grossly normal  A/P:  1. Right sided pleural effusion- unable to determine need for VATS based on CXR, would recommend CT scan   Ct Chest Wo Contrast  06/12/2012  *RADIOLOGY REPORT*  Clinical Data: Right pleural effusion.  Shortness of breath.  CT CHEST WITHOUT CONTRAST  Technique:  Multidetector CT imaging of the chest was performed following the standard protocol without IV contrast.  Comparison: Chest CT 05/25/2012.  Findings:  Mediastinum: Heart size is mildly enlarged. Small amount of pericardial fluid and/or thickening, unchanged and unlikely to be of hemodynamic significance at this time.  Left-sided pacemaker device in place with lead tips terminating in the right atrium and right ventricular apex. There is atherosclerosis of the thoracic aorta, the great vessels of the mediastinum and the coronary arteries, including calcified atherosclerotic plaque in the left main, left anterior descending, left circumflex and  right coronary arteries. Numerous reactive sized mediastinal and hilar lymph nodes are noted. Please note that accurate exclusion of hilar adenopathy is limited on noncontrast CT scans.   Esophagus is unremarkable in appearance.  Lungs/Pleura: A complex multilocular right-sided pleural effusion, moderate to large in size, similar to prior examination.  Extensive thickening of the peribronchovascular interstitium throughout the right lung, particularly in the perihilar region.  Improvement in atelectasis in the left lower lobe noted on the prior examination. No new consolidative airspace disease.  Upper Abdomen: 3.3 x 2.4 cm ill-defined low attenuation lesion in the left lobe of the liver is incompletely characterized on this noncontrast CT examination.  This appears to be new compared to prior examinations.  Bilateral perinephric stranding.  Extensive atherosclerosis.  Musculoskeletal: Old healed fracture of the lateral aspect of the left fifth rib and the lateral aspect of the right third and seventh ribs.  A small sclerotic lesion in the lateral aspect of the right second rib is favored to represent a bone island. Multiple other less well defined sclerotic lesions are noted throughout the thoracic spine, most notably in the posterolateral aspect of the T7 vertebral body on the right extending toward the pedicle.  Surgical clips in the axillary regions bilaterally, compatible with prior nodal dissection.  IMPRESSION: 1.  Complex multilocular right-sided pleural effusion is very similar in size to the prior examination. 2.  Extensive peribronchovascular interstitial thickening throughout  the right lung, most pronounced in the right perihilar region.  The appearance is unusual and could suggest the presence of lymphangitic spread of malignancy.  Clinical correlation is recommended, with consideration for further evaluation with PET CT to exclude malignancy if clinically indicated. 3.  At time of follow-up imaging,  attention to the multiple sclerotic lesions in the spine is recommended, as sclerotic metastases could have a similar appearance. 4.  New low attenuation lesion in the left lobe of the liver measuring 3.4 x 2.4 cm.  If there is an underlying malignancy, this could certainly represent a metastatic lesion.  Alternatively, clinical correlation for signs and symptoms of infection may be warranted.  This could be further evaluated with dedicated abdominal CT scan with contrast. 5.  Additional incidental findings, similar to prior studies, as above.   Original Report Authenticated By: Trudie Reed, M.D.    Dg Chest Port 1 View  06/12/2012  *RADIOLOGY REPORT*  Clinical Data: Shortness breath.  Pleural effusion.  PORTABLE CHEST - 1 VIEW  Comparison: 06/11/2012  Findings: Interval removal of endotracheal tube and nasogastric tube noted.  Left-sided Port-A-Cath and transvenous pacemaker remain in appropriate position.  Increased right lung volume loss noted.  A persistent small right pleural effusion is seen.  Increased atelectasis or consolidation noted in both the right upper lobe and right perihilar region. Heart size remains stable.  IMPRESSION: Increased right lung volume loss following extubation.  Increased atelectasis or consolidation in the right upper lobe and right perihilar region, with persistent small right pleural effusion.   Original Report Authenticated By: Myles Rosenthal, M.D.     I have seen patient and reviewed the history and ct scan; The patient has some effusion on the right but now significant to warrant rt VATS, or pleurex. The risks and morbility does no warrant the potential minimal gain. Discussed with Pulmonary MD   Delight Ovens MD  Beeper [phone] Office [phone] 06/12/2012 6pm

## 2012-06-12 NOTE — Progress Notes (Signed)
PULMONARY  / CRITICAL CARE MEDICINE  Name: Melissa Villa MRN: [id number] DOB: Feb 20, 1946    ADMISSION DATE:  06/09/2012 CONSULTATION DATE:  06/09/12   REFERRING MD :  IM TS  CHIEF COMPLAINT:  R Effusion, Resp Failure  BRIEF PATIENT DESCRIPTION: 67 y/o F with PMH of invasive ductal breast cancer, carcinoid of RLL, diastolic heart failure, chronic obstructive pulmonary disease, diabetes, and complete heart block requiring dual-chamber pacemaker who presented to [facility] on 2/21 after multiple falls at home, increasing shortness of breath. Found to have worsening of R pleural effusion.  Placed on BiPAP for support and intubated.   SIGNIFICANT EVENTS / STUDIES:  2/5-2/17 - admit for acute on chronic resp. Failure possible HCAP, right leg DVT, C.diff, AKI, and CT guided drainage of pleural effusion.  .......................................................................... 2/21 - Admit with resp failure, worsening effusion, bleeding from chest tube site 2/21 head CT - neg acute 2/21- neck CT- neg acute fx, Mixed lytic sclerotic appearance of the C6 vertebral body.This is highly suspicious for metastatic disease. Further evaluation with MRI is recommended. 2/22 renal US - neg hydro 2/23- redcution in effusion on pcxr  LINES / TUBES: Porta cath>>> Chest tube 2/13>>>out pre dc 2/17;/14 .... 2/22 ett>>> 06/11/2012, BiPAP 06/12/2012 >>    CULTURES: Urine 2/22>>> MRSa pos BC 2/22>>> cdiff 2/23>>> NEG      ANTIBIOTICS: Vanco 2/21>>>2/21 Zosyn 2/21>>>2/21 Flagyl (hx of C-DifF)2/21 >>> 06/12/2012 [C. difficile negative]    SUBJECTIVE:  06/12/2012: Extubated yesterday but complaining of increasing respiratory distress today. We will start BiPAP  VITAL SIGNS: Temp:  [98.2 F (36.8 C)-98.8 F (37.1 C)] 98.8 F (37.1 C) (02/24 0836) Pulse Rate:  [86-104] 86 (02/24 1131) Resp:  [15-31] 26 (02/24 1131) BP: (146-152)/(55-60) 148/60 mmHg (02/24 0320) SpO2:  [89 %-100 %] 100 % (02/24  1131) FiO2 (%):  [50 %] 50 % (02/24 1131) Weight:  [126 kg (277 lb 12.5 oz)] 126 kg (277 lb 12.5 oz) (02/24 0500)  PHYSICAL EXAMINATION: General:  Morbidly obese in NAd Neuro:  rass 0 not following commands HEENT: ett Cardiovascular:  s1s2 rrr, no m/r/g Lungs: Coarse crackles, that are scattered,. Mild to moderate respiratory distress. No accessory muscle use no paradoxical breathing Abdomen:  Obese/soft, bsx4 active Musculoskeletal:  No acute deformities Skin:  Warm/dry, R breast with old small incision site from previous chest tube oozing blood, changes c/w radiation   Recent Labs Lab 06/10/12 0630 06/11/12 0520 06/12/12 0430  NA 140 140 142  K 4.4 3.7 3.7  CL 106 106 110  CO2 25 26 26   BUN 32* 42* 30*  CREATININE 2.30* 1.72* 1.06  GLUCOSE 75 147* 150*    Recent Labs Lab 06/10/12 0630 06/11/12 0520 06/12/12 0430  HGB 7.6* 7.8* 7.5*  HCT 27.1* 27.7* 26.8*  WBC 11.8* 10.6* 7.2  PLT 380 378 328   Dg Chest Port 1 View  06/12/2012  *RADIOLOGY REPORT*  Clinical Data: Shortness breath.  Pleural effusion.  PORTABLE CHEST - 1 VIEW  Comparison: 06/11/2012  Findings: Interval removal of endotracheal tube and nasogastric tube noted.  Left-sided Port-A-Cath and transvenous pacemaker remain in appropriate position.  Increased right lung volume loss noted.  A persistent small right pleural effusion is seen.  Increased atelectasis or consolidation noted in both the right upper lobe and right perihilar region. Heart size remains stable.  IMPRESSION: Increased right lung volume loss following extubation.  Increased atelectasis or consolidation in the right upper lobe and right perihilar region, with persistent small right pleural  effusion.   Original Report Authenticated By: Myles Rosenthal, M.D.    Dg Chest Port 1 View  06/11/2012  *RADIOLOGY REPORT*  Clinical Data: Evaluate endotracheal tube.CHF.  COPD.  Diabetes.  PORTABLE CHEST - 1 VIEW  Comparison: 1 day prior  Findings: Endotracheal tube  3.2 cm above carina.  Pacer leads right atrium right ventricle.  Nasogastric extends beyond the  inferior aspect of the film.  Left-sided Port-A-Cath terminating at the low SVC.  Borderline cardiomegaly.  Moderate hemidiaphragm elevation. Improved right-sided pleural fluid with decreased loculation laterally. No pneumothorax.  Mild pulmonary venous congestion. Persistent patchy right-sided airspace disease.  IMPRESSION: Decreased right-sided pleural fluid with loculation laterally.  Improved right-sided aeration with persistent airspace disease and volume loss remaining.  Mild pulmonary venous congestion.   Original Report Authenticated By: Jeronimo Greaves, M.D.     ASSESSMENT / PLAN:   Acute Respiratory Failure - recent admit for ? HCAP vs malignant chest findings / neg cytology O2 Dependent COPD  OSA Pleural Effusion - chest tube from 2/7-2/17 placed by IR.  Peripheral smear/cytology with no malignant cells.  Concern for hemothorax with bleeding from old anterior chest tube site.  ? If communication to pleural space. Pulmonary HTN - PA on ECHO 104  - 06/12/2012: ? Loculated effusion on the right side. Worsening respiratory distress  Plan: -CVTS consult for potential VATS and Pleux cath placement in future - I spoke to Dr. Tyrone Sage and he will evaluate for a CT scan is obtained Start BiPAP Reintubate ifneeded  HTN Chronic Diastolic CHF - ECHO 05/25/12 with nml systolic fxn, EF 16-10%, PA peak 104  Recent Labs Lab 06/09/12 1732 06/12/12 1100  PROBNP 1443.0* 637.0*    No results found for this basename: TROPONINI,  in the last 168 hours   Plan: - Saline lock IV -hold home anti-hypertensives (ARB) -monitor BP -tele  Acute Renal Failure  Recent Labs Lab 06/06/12 0526 06/09/12 1325 06/10/12 0630 06/11/12 0520 06/12/12 0430  NA 141 139 140 140 142  K 4.0 4.4 4.4 3.7 3.7  CL 102 103 106 106 110  CO2 30 26 25 26 26   GLUCOSE 194* 102* 75 147* 150*  BUN 6 28* 32* 42* 30*   CREATININE 0.89 2.13* 2.30* 1.72* 1.06  CALCIUM 9.6 8.3* 8.0* 8.9 8.8    ARF resolving Admit wth 2.13 in setting of metformin / ARB.  Plan: -hold hydration -follow up BMP in am   Diabetes Mellitus - on metformin at baseline, may not be best choice given hx of renal insufficency  Plan: -CBG's Q4 - N.p.o. due to respiratory distress 06/12/2012   Hematology and oncology  Recent Labs Lab 06/10/12 0630 06/11/12 0520 06/12/12 0430  HGB 7.6* 7.8* 7.5*  HCT 27.1* 27.7* 26.8*  WBC 11.8* 10.6* 7.2  PLT 380 378 328     Hx of Invasive Ductal Breast Cancer - followed by Dr. [name].  Previously was on Herceptin but was d/c'd out of concern for CHF.  Now on Fulvestrant. 06/09/2012 : CT scan shows possible C6 vertebral metastasis Carcinoid RLL  R DVT - (05/2012) on full dose lovenox Anemia - no significant drop in Hgb   Plan: -heparin drip maintain -no Lovenox in setting arf - Pack red cells for hemoglobin less than 7 g percent  Neurologic Acute Encephalopathy - in setting of respiratory distress, acute renal failure Falls - multiple falls in recent months.  Generalized decline.    Plan: -fall precautions -CT neck will have to be addressed with  oncology  / rad    Infectious diseases  Recent Labs Lab 06/09/12 1732  LATICACIDVEN 1.0    Hx of Recent C-Diff but  06/11/2012 C. difficile negative Rule out Pulmonary Infection - low clinical suspicion for infectious pulmonary process.    Plan: -Discontinue metronidazole   Global  - Discussed with Dr. Tyrone Sage and Dr. [name]  The patient is critically ill with multiple organ systems failure and requires high complexity decision making for assessment and support, frequent evaluation and titration of therapies, application of advanced monitoring technologies and extensive interpretation of multiple databases.   Critical Care Time devoted to patient care services described in this note is  31  Minutes.  Dr. Kalman Shan, M.D., [facility].C.P Pulmonary and Critical Care Medicine Staff Physician [facility] Pager: [phone], If no answer or between  15:00h - 7:00h: call [phone]  06/12/2012 1:18 PM

## 2012-06-13 DIAGNOSIS — C7952 Secondary malignant neoplasm of bone marrow: Secondary | ICD-10-CM

## 2012-06-13 DIAGNOSIS — C7800 Secondary malignant neoplasm of unspecified lung: Secondary | ICD-10-CM

## 2012-06-13 DIAGNOSIS — R0602 Shortness of breath: Secondary | ICD-10-CM

## 2012-06-13 DIAGNOSIS — C50919 Malignant neoplasm of unspecified site of unspecified female breast: Secondary | ICD-10-CM

## 2012-06-13 DIAGNOSIS — J90 Pleural effusion, not elsewhere classified: Secondary | ICD-10-CM

## 2012-06-13 LAB — HEPARIN LEVEL (UNFRACTIONATED): Heparin Unfractionated: 0.22 IU/mL — ABNORMAL LOW (ref 0.30–0.70)

## 2012-06-13 LAB — CBC WITH DIFFERENTIAL/PLATELET
Basophils Absolute: 0 10*3/uL (ref 0.0–0.1)
Basophils Relative: 0 % (ref 0–1)
Eosinophils Absolute: 0.3 10*3/uL (ref 0.0–0.7)
HCT: 26.1 % — ABNORMAL LOW (ref 36.0–46.0)
Hemoglobin: 7.2 g/dL — ABNORMAL LOW (ref 12.0–15.0)
Lymphs Abs: 1.9 10*3/uL (ref 0.7–4.0)
MCH: 19.7 pg — ABNORMAL LOW (ref 26.0–34.0)
MCHC: 27.6 g/dL — ABNORMAL LOW (ref 30.0–36.0)
MCV: 71.3 fL — ABNORMAL LOW (ref 78.0–100.0)
Neutro Abs: 5 10*3/uL (ref 1.7–7.7)
RDW: 24.9 % — ABNORMAL HIGH (ref 11.5–15.5)

## 2012-06-13 LAB — BASIC METABOLIC PANEL
BUN: 23 mg/dL (ref 6–23)
CO2: 25 mEq/L (ref 19–32)
Chloride: 107 mEq/L (ref 96–112)
Creatinine, Ser: 1.06 mg/dL (ref 0.50–1.10)
Glucose, Bld: 239 mg/dL — ABNORMAL HIGH (ref 70–99)
Potassium: 4.4 mEq/L (ref 3.5–5.1)

## 2012-06-13 LAB — GLUCOSE, CAPILLARY
Glucose-Capillary: 195 mg/dL — ABNORMAL HIGH (ref 70–99)
Glucose-Capillary: 259 mg/dL — ABNORMAL HIGH (ref 70–99)
Glucose-Capillary: 208 mg/dL — ABNORMAL HIGH (ref 70–99)
Glucose-Capillary: 280 mg/dL — ABNORMAL HIGH (ref 70–99)

## 2012-06-13 LAB — MAGNESIUM: Magnesium: 1.6 mg/dL (ref 1.5–2.5)

## 2012-06-13 MED ORDER — DEXTROSE 5 % IV SOLN
2.0000 g | Freq: Once | INTRAVENOUS | Status: AC
  Administered 2012-06-13: 2 g via INTRAVENOUS
  Filled 2012-06-13 (×2): qty 4

## 2012-06-13 MED ORDER — POTASSIUM CHLORIDE 20 MEQ/15ML (10%) PO LIQD
20.0000 meq | Freq: Two times a day (BID) | ORAL | Status: DC
  Filled 2012-06-13: qty 15

## 2012-06-13 MED ORDER — HEPARIN BOLUS VIA INFUSION
3000.0000 [IU] | Freq: Once | INTRAVENOUS | Status: AC
  Administered 2012-06-13: 3000 [IU] via INTRAVENOUS
  Filled 2012-06-13: qty 3000

## 2012-06-13 MED ORDER — POTASSIUM CHLORIDE CRYS ER 20 MEQ PO TBCR
20.0000 meq | EXTENDED_RELEASE_TABLET | Freq: Two times a day (BID) | ORAL | Status: DC
  Administered 2012-06-13 (×2): 20 meq via ORAL
  Filled 2012-06-13 (×4): qty 1

## 2012-06-13 MED ORDER — HYDRALAZINE HCL 25 MG PO TABS
25.0000 mg | ORAL_TABLET | Freq: Three times a day (TID) | ORAL | Status: DC
  Administered 2012-06-13 – 2012-06-21 (×25): 25 mg via ORAL
  Filled 2012-06-13 (×28): qty 1

## 2012-06-13 MED ORDER — FUROSEMIDE 10 MG/ML IJ SOLN
20.0000 mg | Freq: Two times a day (BID) | INTRAMUSCULAR | Status: DC
  Administered 2012-06-13 – 2012-06-14 (×4): 20 mg via INTRAVENOUS
  Filled 2012-06-13 (×6): qty 2

## 2012-06-13 MED ORDER — CHLORHEXIDINE GLUCONATE CLOTH 2 % EX PADS
6.0000 | MEDICATED_PAD | Freq: Every day | CUTANEOUS | Status: AC
  Administered 2012-06-13 – 2012-06-17 (×5): 6 via TOPICAL

## 2012-06-13 MED ORDER — ADULT MULTIVITAMIN W/MINERALS CH
1.0000 | ORAL_TABLET | Freq: Every day | ORAL | Status: DC
  Administered 2012-06-13 – 2012-06-21 (×9): 1 via ORAL
  Filled 2012-06-13 (×9): qty 1

## 2012-06-13 MED ORDER — MORPHINE SULFATE 10 MG/5ML PO SOLN
5.0000 mg | Freq: Four times a day (QID) | ORAL | Status: DC
  Administered 2012-06-13 – 2012-06-20 (×28): 5 mg via ORAL
  Filled 2012-06-13 (×29): qty 5

## 2012-06-13 NOTE — Progress Notes (Signed)
ANTICOAGULATION CONSULT NOTE  Pharmacy Consult for Heparin Indication: VTE treatment  No Known Allergies  Patient Measurements: Height: 5\' 3"  (160 cm) Weight: 277 lb 12.5 oz (126 kg) IBW/kg (Calculated) : 52.4 Heparin Dosing Weight: 89 kg  Vital Signs: Temp: 97.9 F (36.6 C) (02/25 1554) Temp src: Oral (02/25 1554) BP: 126/53 mmHg (02/25 1816) Pulse Rate: 106 (02/25 2011)  Labs:  Recent Labs  06/11/12 0520  06/12/12 0430 06/13/12 0400 06/13/12 1100 06/13/12 1947  HGB 7.8*  --  7.5* 7.2*  --   --   HCT 27.7*  --  26.8* 26.1*  --   --   PLT 378  --  328 306  --   --   HEPARINUNFRC  --   < > 0.31 0.21* 0.11* 0.22*  CREATININE 1.72*  --  1.06 1.06  --   --   < > = values in this interval not displayed.  Estimated Creatinine Clearance: 69.2 ml/min (by C-G formula based on Cr of 1.06).  Assessment: 67 y.o. female with recent DVT, s/p recent CT (2/7-2/17 for pleural effusion).   Heparin level remains sub-therapeutic (0.22) at rate of 1100 units/hr (rate increased this AM). No problem with line / infusion and no bleeding per RN.  Has had fluctuating levels (sub & supra-therapeutic) during heparin therapy, so will need to monitor closely.  Goal of Therapy:  Heparin level 0.3-0.7 units/ml Monitor platelets by anticoagulation protocol: Yes   Plan:  - Increase IV heparin rate to 1300 units/hr - Check 6 hour heparin level - Daily heparin level and CBC - Monitor sign/symptoms of bleeding    Benjaman Pott, PharmD, BCPS 06/13/2012   9:03 PM

## 2012-06-13 NOTE — Progress Notes (Signed)
ANTICOAGULATION CONSULT NOTE  Pharmacy Consult for Heparin Indication: VTE treatment  No Known Allergies  Patient Measurements: Height: 5\' 3"  (160 cm) Weight: 277 lb 12.5 oz (126 kg) IBW/kg (Calculated) : 52.4 Heparin Dosing Weight: 89 kg  Vital Signs: Temp: 97 F (36.1 C) (02/25 0334) Temp src: Axillary (02/25 0334) BP: 154/57 mmHg (02/25 0312) Pulse Rate: 90 (02/25 0312)  Labs:  Recent Labs  06/10/12 0630  06/11/12 0520  06/11/12 2153 06/12/12 0430 06/13/12 0400  HGB 7.6*  --  7.8*  --   --  7.5* 7.2*  HCT 27.1*  --  27.7*  --   --  26.8* 26.1*  PLT 380  --  378  --   --  328 306  APTT 52*  --   --   --   --   --   --   LABPROT 15.6*  --   --   --   --   --   --   INR 1.27  --   --   --   --   --   --   HEPARINUNFRC  --   < >  --   < > 0.38 0.31 0.21*  CREATININE 2.30*  --  1.72*  --   --  1.06 1.06  < > = values in this interval not displayed.  Estimated Creatinine Clearance: 69.2 ml/min (by C-G formula based on Cr of 1.06).   Assessment: 67 y.o. female with recent DVT, awaiting pleural effusion drainage, for heparin.  Heparin level (0.21) is below-goal on 800 units/hr. No problem with line / infusion and no bleeding per RN.   Previously, heparin infusion of 1000 units/hr produced a heparin level of 0.79 units/hr.   Goal of Therapy:  Heparin level 0.3-0.7 units/ml Monitor platelets by anticoagulation protocol: Yes   Plan:  1. Increase IV heparin to 950 units/hr.  2. Heparin level in 6 hours.   Lorre Munroe, PharmD  06/13/2012,4:51 AM

## 2012-06-13 NOTE — Progress Notes (Signed)
ANTICOAGULATION CONSULT NOTE  Pharmacy Consult for Heparin Indication: VTE treatment  No Known Allergies  Patient Measurements: Height: 5\' 3"  (160 cm) Weight: 277 lb 12.5 oz (126 kg) IBW/kg (Calculated) : 52.4 Heparin Dosing Weight: 89 kg  Vital Signs: Temp: 98.1 F (36.7 C) (02/25 1250) Temp src: Oral (02/25 1250) BP: 164/63 mmHg (02/25 1400) Pulse Rate: 99 (02/25 1400)  Labs:  Recent Labs  06/11/12 0520  06/12/12 0430 06/13/12 0400 06/13/12 1100  HGB 7.8*  --  7.5* 7.2*  --   HCT 27.7*  --  26.8* 26.1*  --   PLT 378  --  328 306  --   HEPARINUNFRC  --   < > 0.31 0.21* 0.11*  CREATININE 1.72*  --  1.06 1.06  --   < > = values in this interval not displayed.  Estimated Creatinine Clearance: 69.2 ml/min (by C-G formula based on Cr of 1.06).   Assessment: 67 y.o. female with recent DVT, s/p recent CT (2/7-2/17 fo4r pleural effusion).  Heparin level (0.11) is below-goal on 950 units/hr. No problem with line / infusion and no bleeding per RN. This level has trended down despite rate increase on last check. H/H and plts are stable.  Goal of Therapy:  Heparin level 0.3-0.7 units/ml Monitor platelets by anticoagulation protocol: Yes   Plan:  - Heparin 3000 unit bolus x 1 now, then increase rate to 1100 units/hr - Heparin level at 1900 today - Daily heparin level and CBC - Will f/up for chronic anticoagulation plans  Thanks, Peggy Monk K. Allena Katz, PharmD, BCPS.  Clinical Pharmacist Pager [phone]. 06/13/2012 2:33 PM

## 2012-06-13 NOTE — Progress Notes (Signed)
PULMONARY  / CRITICAL CARE MEDICINE  Name: Melissa Villa MRN: [id number] DOB: 11/17/1945    ADMISSION DATE:  06/09/2012 CONSULTATION DATE:  06/09/12   REFERRING MD :  IM TS  CHIEF COMPLAINT:  R Effusion, Resp Failure  BRIEF PATIENT DESCRIPTION: 67 y/o F with PMH of invasive ductal breast cancer, carcinoid of RLL, diastolic heart failure, chronic obstructive pulmonary disease, diabetes, and complete heart block requiring dual-chamber pacemaker who presented to [facility] on 2/21 after multiple falls at home, increasing shortness of breath. Found to have worsening of R pleural effusion.  Placed on BiPAP for support and intubated.    LINES / TUBES: Porta cath>>> Chest tube 2/13>>>out pre dc 2/17;/14 .... 2/22 ett>>> 06/11/2012, BiPAP 06/12/2012 >>    CULTURES: Urine 2/22>>> negative MRSa pos BC 2/22>>> no growth as 06/13/2012 cdiff 2/23>>> NEG       ANTIBIOTICS: Vanco 2/21>>>2/21 Zosyn 2/21>>>2/21 Flagyl (hx of C-DifF)2/21 >>> 06/12/2012 [C. difficile negative]     SIGNIFICANT EVENTS / STUDIES:  2/5-2/17 - admit for acute on chronic resp. Failure possible HCAP, right leg DVT, C.diff, AKI, and CT guided drainage of pleural effusion.  .......................................................................... 2/21 - Admit with resp failure, worsening effusion, bleeding from chest tube site 2/21 head CT - neg acute 2/21- neck CT- neg acute fx, Mixed lytic sclerotic appearance of the C6 vertebral body.This is highly suspicious for metastatic disease. Further evaluation with MRI is recommended. 2/22 renal US - neg hydro 2/23- redcution in effusion on pcxr 06/12/2012: Extubated yesterday but complaining of increasing respiratory distress today. We will start BiPAP   SUBJECTIVE/OVERNIGHT/INTERVAL HX 06/13/2012: Continues to have mild to moderate respiratory distress and dyspnea. CT chest only shows small loculated pleural effusion that is too small for intervention. However  investigations are revealing multiple metastasis including liver, potential lung, and spinous  VITAL SIGNS: Temp:  [97 F (36.1 C)-98.8 F (37.1 C)] 97.5 F (36.4 C) (02/25 0825) Pulse Rate:  [80-100] 80 (02/25 0800) Resp:  [20-32] 21 (02/25 0800) BP: (134-161)/(52-67) 134/59 mmHg (02/25 0800) SpO2:  [50 %-100 %] 100 % (02/25 0800) FiO2 (%):  [50 %] 50 % (02/25 0738)  PHYSICAL EXAMINATION: General:  Morbidly obese in NAd Neuro:  rass 0 not following commands HEENT: ett Cardiovascular:  s1s2 rrr, no m/r/g Lungs: Coarse crackles, that are scattered,. Mild to moderate respiratory distress. No accessory muscle use no paradoxical breathing Abdomen:  Obese/soft, bsx4 active Musculoskeletal:  No acute deformities Skin:  Warm/dry, R breast with old small incision site from previous chest tube oozing blood, changes c/w radiation   Recent Labs Lab 06/11/12 0520 06/12/12 0430 06/13/12 0400  NA 140 142 140  K 3.7 3.7 4.4  CL 106 110 107  CO2 26 26 25   BUN 42* 30* 23  CREATININE 1.72* 1.06 1.06  GLUCOSE 147* 150* 239*    Recent Labs Lab 06/11/12 0520 06/12/12 0430 06/13/12 0400  HGB 7.8* 7.5* 7.2*  HCT 27.7* 26.8* 26.1*  WBC 10.6* 7.2 8.1  PLT 378 328 306   Ct Chest Wo Contrast  06/12/2012  *RADIOLOGY REPORT*  Clinical Data: Right pleural effusion.  Shortness of breath.  CT CHEST WITHOUT CONTRAST  Technique:  Multidetector CT imaging of the chest was performed following the standard protocol without IV contrast.  Comparison: Chest CT 05/25/2012.  Findings:  Mediastinum: Heart size is mildly enlarged. Small amount of pericardial fluid and/or thickening, unchanged and unlikely to be of hemodynamic significance at this time.  Left-sided pacemaker device in place with lead  tips terminating in the right atrium and right ventricular apex. There is atherosclerosis of the thoracic aorta, the great vessels of the mediastinum and the coronary arteries, including calcified atherosclerotic  plaque in the left main, left anterior descending, left circumflex and right coronary arteries. Numerous reactive sized mediastinal and hilar lymph nodes are noted. Please note that accurate exclusion of hilar adenopathy is limited on noncontrast CT scans.   Esophagus is unremarkable in appearance.  Lungs/Pleura: A complex multilocular right-sided pleural effusion, moderate to large in size, similar to prior examination.  Extensive thickening of the peribronchovascular interstitium throughout the right lung, particularly in the perihilar region.  Improvement in atelectasis in the left lower lobe noted on the prior examination. No new consolidative airspace disease.  Upper Abdomen: 3.3 x 2.4 cm ill-defined low attenuation lesion in the left lobe of the liver is incompletely characterized on this noncontrast CT examination.  This appears to be new compared to prior examinations.  Bilateral perinephric stranding.  Extensive atherosclerosis.  Musculoskeletal: Old healed fracture of the lateral aspect of the left fifth rib and the lateral aspect of the right third and seventh ribs.  A small sclerotic lesion in the lateral aspect of the right second rib is favored to represent a bone island. Multiple other less well defined sclerotic lesions are noted throughout the thoracic spine, most notably in the posterolateral aspect of the T7 vertebral body on the right extending toward the pedicle.  Surgical clips in the axillary regions bilaterally, compatible with prior nodal dissection.  IMPRESSION: 1.  Complex multilocular right-sided pleural effusion is very similar in size to the prior examination. 2.  Extensive peribronchovascular interstitial thickening throughout the right lung, most pronounced in the right perihilar region.  The appearance is unusual and could suggest the presence of lymphangitic spread of malignancy.  Clinical correlation is recommended, with consideration for further evaluation with PET CT to exclude  malignancy if clinically indicated. 3.  At time of follow-up imaging, attention to the multiple sclerotic lesions in the spine is recommended, as sclerotic metastases could have a similar appearance. 4.  New low attenuation lesion in the left lobe of the liver measuring 3.4 x 2.4 cm.  If there is an underlying malignancy, this could certainly represent a metastatic lesion.  Alternatively, clinical correlation for signs and symptoms of infection may be warranted.  This could be further evaluated with dedicated abdominal CT scan with contrast. 5.  Additional incidental findings, similar to prior studies, as above.   Original Report Authenticated By: Trudie Reed, M.D.    Dg Chest Port 1 View  06/12/2012  *RADIOLOGY REPORT*  Clinical Data: Shortness breath.  Pleural effusion.  PORTABLE CHEST - 1 VIEW  Comparison: 06/11/2012  Findings: Interval removal of endotracheal tube and nasogastric tube noted.  Left-sided Port-A-Cath and transvenous pacemaker remain in appropriate position.  Increased right lung volume loss noted.  A persistent small right pleural effusion is seen.  Increased atelectasis or consolidation noted in both the right upper lobe and right perihilar region. Heart size remains stable.  IMPRESSION: Increased right lung volume loss following extubation.  Increased atelectasis or consolidation in the right upper lobe and right perihilar region, with persistent small right pleural effusion.   Original Report Authenticated By: Myles Rosenthal, M.D.     Principal Problem:   Respiratory distress Active Problems:   DIABETES MELLITUS, TYPE II   OBSTRUCTIVE SLEEP APNEA   HYPERTENSION   CONGESTIVE HEART FAILURE   COPD   Pleural effusion, right  HCAP (healthcare-associated pneumonia)   Right leg DVT   Altered mental status    ASSESSMENT / PLAN:   Acute Respiratory Failure - recent admit for ? HCAP vs malignant chest findings / neg cytology O2 Dependent COPD  OSA Pleural Effusion - chest tube  from 2/7-2/17 placed by IR.  Peripheral smear/cytology with no malignant cells.  Concern for hemothorax with bleeding from old anterior chest tube site.  ? If communication to pleural space. Pulmonary HTN - PA on ECHO 104  - 06/09/2012: Respiratory distress continues. Small loculated right effusion that according to Dr. Tyrone Sage is too small for intervention  Plan: - BiPAP as needed in the daytime and continuous at night Reintubate ifneeded Morphine and Lasix for dyspnea relief  HTN Chronic Diastolic CHF - ECHO 05/25/12 with nml systolic fxn, EF 16-10%, PA peak 104  Recent Labs Lab 06/09/12 1732 06/12/12 1100 06/13/12 0400  PROBNP 1443.0* 637.0* 661.0*    No results found for this basename: TROPONINI,  in the last 168 hours   Plan: - Saline lock IV -hold home anti-hypertensives (ARB) -monitor BP -tele - Start Lasix 06/13/2012  Acute Renal Failure  Recent Labs Lab 06/09/12 1325 06/10/12 0630 06/11/12 0520 06/12/12 0430 06/13/12 0400  NA 139 140 140 142 140  K 4.4 4.4 3.7 3.7 4.4  CL 103 106 106 110 107  CO2 26 25 26 26 25   GLUCOSE 102* 75 147* 150* 239*  BUN 28* 32* 42* 30* 23  CREATININE 2.13* 2.30* 1.72* 1.06 1.06  CALCIUM 8.3* 8.0* 8.9 8.8 8.8  MG  --   --   --   --  1.6  PHOS  --   --   --   --  3.5    ARF resolving Admit wth 2.13 in setting of metformin / ARB.  On 06/13/2012 acute renal failure is resolved but she has hypomagnesemia  Plan: -hold hydration -f start Lasix 06/13/2012 due to elevated BNP - Replace mag  Diabetes Mellitus - on metformin at baseline, may not be best choice given hx of renal insufficency  Plan: -Heart healthy diet   Hematology and oncology  Recent Labs Lab 06/11/12 0520 06/12/12 0430 06/13/12 0400  HGB 7.8* 7.5* 7.2*  HCT 27.7* 26.8* 26.1*  WBC 10.6* 7.2 8.1  PLT 378 328 306     Hx of Invasive Ductal Breast Cancer - followed by Dr. [name].  Previously was on Herceptin but was d/c'd out of concern for CHF.   Now on Fulvestrant. 06/09/2012 : CT scan shows possible C6 vertebral metastasis Carcinoid RLL  R DVT - (05/2012) on full dose lovenox Anemia - no significant drop in Hgb   - 06/13/2012: Evidence of progression of her breast cancer to stage IV.   Plan: -heparin drip maintain for DVT (at some point we'll switch to Lovenox] - - Pack red cells for hemoglobin less than 7 g percent - Oncologist DR Magrinat to give opinion about prognosis and future treatment options for breast cancer (I discussed with him and he will be by on 02/25/2014_   Neurologic Acute Encephalopathy - in setting of respiratory distress, acute renal failure Falls - multiple falls in recent months.  Generalized decline.    Plan: -fall precautions  Infectious diseases  Recent Labs Lab 06/09/12 1732  LATICACIDVEN 1.0    Hx of Recent C-Diff but  06/11/2012 C. difficile negative Rule out Pulmonary Infection - low clinical suspicion for infectious pulmonary process.    - Off antibiotics as a 06/12/2012  Plan: -Monitor off antibiotics   Global  - Discussed with Dr. Tyrone Sage and Dr. [name]  The patient is critically ill with multiple organ systems failure and requires high complexity decision making for assessment and support, frequent evaluation and titration of therapies, application of advanced monitoring technologies and extensive interpretation of multiple databases.   Critical Care Time devoted to patient care services described in this note is  31  Minutes.  Dr. Kalman Shan, M.D., [facility].C.P Pulmonary and Critical Care Medicine Staff Physician  System [facility] Pager: [phone], If no answer or between  15:00h - 7:00h: call [phone]  06/13/2012 11:09 AM

## 2012-06-13 NOTE — Progress Notes (Signed)
ID: Melissa Villa DOB: [birth year] MR#: [id number] .   06/14/2063 4:59 PM .  Melissa Villa   PCP: [name], MD  GYN:  SU:  OTHER MD: [name], [name]  CONSULT: Dr Colletta [name] called requesting a consult regarding patient's condition insofar as her recurrent pumonary prblems may be due to metastatic breast cancer.  HISTORY OF PRESENT ILLNESS:  Mr. Melissa Villa has a history of remote bilateral breast cancer, as noted below. More recently, on 01/19/2012, she presented for routine screening mammography, and it was found that her right breast had shrunk considerably, with increased density. It was difficult to pull away the breast from the chest wall for imaging.  Accordingly an ultrasound was obtained and this showed a 3.5 cm irregular microlobulated mass parallel to the chest wall. There was no increased Doppler activity. It was difficult to tell whether this was recurrent tumor or progressive scarring. Biopsy on the same day showed ([id number]) an invasive ductal carcinoma, grade 2, estrogen receptor 100% and progesterone receptor 65% positive, with an MIB-1 of 59%. HER-2 was amplified. Her subsequent history is as detailed below   INTERVAL HISTORY:  Since her last visit in our office the patient has been admitted x2 and required intubation x3 for respiratory failure. Workup as detailed in scans and other films below have found  (a) a Right loculated pleural effusion with Right peribronchial thickening possibly consistent with lymphangitic spread of tumor  (b) a 3.3 cm liver lesion which appears to be new and may be c/w metastatic spread of breast cancer to the liver  (c) multiple spinal sclerotic lesions c/w metastatic spread to bone  (d) head CT showed no obvious spread to brain We have no pathology or cytology from any of these findings. The implications are discussed in the "plan" below  REVIEW OF SYSTEMS:  Melissa Villa is currently extubated. She is alert  and well-oriented. We went over her recent history and she wants to know "what she should do next." Despite the SOB she is not bringing up any sputum. There is no pleurisy or hemoptysis. She denies headaches, nausea or vomiting. She has covered up her Right breast because it "looks so bad", and it is sore at times. Otherwise she denies pain. She is very weak and does not know if she will be able to get back home, go to Rehab or SNF. A detailed review of systems today was otherwise stable. No family in room  PAST MEDICAL HISTORY:  Past Medical History   Diagnosis  Date   .  CHF (congestive heart failure)      2-D echo 02/19/2009 showed left ventricle cavity size normal; systolic function normal, estimated ejection fraction 55%; wall motion normal; no regional wall motion abnormalities; PA peak pressure 47mm Hg.   Melissa Villa  COPD (chronic obstructive pulmonary disease)      Pulmonary function tests on 08/05/2010 showed mixed obstructive and restrictive lung disease with no significant response to bronchodilator, and decreased diffusion capacity that corrects to normal range when adjusted for the inhaled alveolar volume.   .  Degenerative joint disease    .  AV block, complete      S/P dual-chamber for symptomatic episodes of bradycardia and complete heart block.   .  Pacemaker      S/P dual-chamber for symptomatic episodes of bradycardia and complete heart block.   .  Adenomatous polyp of colon      Tubular adenomas X 2 found 06/1999; negative screening colonoscopy 02/13/2004  by Dr. Danise Villa.   .  Hypercholesteremia    .  Obstructive sleep apnea      Baseline diagnostic nocturnal polysomnogram on July 27, 2005 showed severe obstructive sleep apnea/hypopnea syndrome, with AHI 153.2 per hour. Last nocturnal polysomnogram done for CPAP titration was 05/01/2009; CPAP was titrated to 13 CWP, AHI 1.8 per hour using a large Respironics FitLife Full Face Mask with heated humidifier.   .  Peripheral autonomic  neuropathy due to diabetes mellitus    .  Carcinoid tumor      S/P right lower lobe lobectomy 06/13/2001 by Dr. [name]   .  Constipation    .  Hand pain    .  Palpitation    .  Rib pain      right sided   .  DM type 2 (diabetes mellitus, type 2)    .  Skin rash    .  Urinary incontinence    .  Proliferative diabetic retinopathy(362.02)      S/P panretinal photocoagulation by Dr. Fawn Villa   .  Vitreous hemorrhage      With tractional detachment, left eye; S/P posterior vitrectomy with membrane peel by Dr. Fawn Villa 04/06/2005   .  Dyspnea on exertion      Cardiac cath 06/17/2005 by Dr. Sharyn Villa showed LVEF 50-55%, 30% proximal LAD stenosis, small diagonals.   .  Shingles rash  03/25/11   .  Breast cancer      S/P left lumpectomy and axillary node dissection 03/1991 for a T1 N0 medullary breast cancer, no lymph node involvement, treated with tamoxifen for 2 years; S/P right lumpectomy and axillary lymph node dissection August 1995 for a T1 N0 microinvasive breast cancer, treated with Arimidex for 5 years. 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 Followed by Dr. Darnelle Villa.   .  Breast cancer, stage 3  102/13     right hx lumpectomy 1994, bx today=invasive ductal ca   .  Allergy      thinitis   .  Asthma    .  Bronchitis      hx   .  Cataract      b/l surgery   .  DM hyperosmolarity type II    .  Hypertension    .  COPD (chronic obstructive pulmonary disease)    .  Diastolic CHF    .  TIA (transient ischemic attack)    .  OSA (obstructive sleep apnea)    .  Chronic respiratory failure with hypoxia    .  Anemia    .  DJD (degenerative joint disease)    .  AV block    .  DM retinopathy    .  DM neuropathies    .   Carcinoid tumor of lung     PAST SURGICAL HISTORY:  Past Surgical History   Procedure  Date   .  Mastectomy partial / lumpectomy w/ axillary lymphadenectomy  1992, 1995     S/P left lumpectomy and axillary lymph node  dissection December 1992 for a T1 N0 medullary breast cancer, no lymph node involvement, treated with tamoxifen for 2 years; S/P right lumpectomy and axillary lymph node dissection August 1995 for a T1 N0 microinvasive breast cancer, treated with Arimidex for 5 years. Patient is followed by Dr. Darnelle Villa at the cancer center.   .  Lung lobectomy  06/13/2001     S/P right lower lobe lobectomy 06/13/2001  by Dr. [name]   .  Pacemaker insertion  04/20/2001   .  Mastectomy partial / lumpectomy    .  Lobectomy    .  Pacemaker insertion    .  Cholecystectomy    .  Tonsillectomy     FAMILY HISTORY  Family History   Problem  Relation  Age of Onset   .  Breast cancer  Mother  1      Deceased Sep 15, 2002    .  Diabetes type II  Mother    .  Heart attack  Brother    .  Cervical cancer  Neg Hx    .  Colon cancer  Neg Hx    .  Breast cancer  Mother    .  Diabetes Mellitus II  Mother    .  Hypertension  Brother    .  Coronary artery disease  Brother    .  Stroke  Brother    .  Diabetes type II  Brother    .  Hypertension  Sister    the patient has no information regarding her father. Her mother died at the age of 5 with end-stage renal disease. She had been diagnosed with breast cancer in her mid 29s. Angelisa had 4 brothers and 3 sisters. One brother died from a drug overdose and one from AIDS. One sister died from EtOH abuse. There is no other history of breast cancer in the family to her knowledge.   GYNECOLOGIC HISTORY:  She is GX P3, first live birth age 39. She stopped having periods in the 1990s.   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 She was widowed in 09-14-05. She lives by herself. Her 3 children live in [place]. When asked who she would call an emergency, she says "I would have to think about it; we are not close". She has 9 grandchildren, but is only in touch with 3. She attends a [employer].   ADVANCED DIRECTIVES: (udated 06/13/2012) she  intends to name her son Derwood Kaplan (works for [employer], phone [phone]) as healthcare power of attorney; she received the appropriate forms to complete and notarize 03/10/2012, however the forms have not yet been completed or notarized   HEALTH MAINTENANCE:  History   Substance Use Topics   .  Smoking status:  Former Smoker -- 1.0 packs/day for 15 years     Types:  Cigarettes     Quit date:  04/19/2001   .  Smokeless tobacco:  Never Used   .  Alcohol Use:  No   l:  No Known Allergies  Current Outpatient Prescriptions   Medication  Sig  Dispense  Refill   .  ADVAIR DISKUS 250-50 MCG/DOSE AEPB  INHALE ONE PUFF INTO THE LUNGS TWICE DAILY  60 each  5   .  AGGRENOX 25-200 MG per 12 hr capsule  TAKE ONE CAPSULE TWICE DAILY  62 capsule  5   .  albuterol (PROAIR HFA) 108 (90 BASE) MCG/ACT inhaler  Inhale 2 puffs into the lungs 4 (four) times daily as needed for wheezing or shortness of breath.  1 Inhaler  11   .  albuterol (PROVENTIL) (2.5 MG/3ML) 0.083% nebulizer solution  Take 3 mLs (2.5 mg total) by nebulization every 6 (six) hours as needed for wheezing or shortness of breath.  75 mL  12   .  amLODipine (NORVASC) 10 MG tablet  Take 10 mg by mouth  daily before breakfast.     .  Bepotastine Besilate (BEPREVE) 1.5 % SOLN  Place 1 drop into both eyes 2 (two) times daily.     .  budesonide (PULMICORT) 0.5 MG/2ML nebulizer solution  Take 2 mLs (0.5 mg total) by nebulization 2 (two) times daily.  2 mL  3   .  calcium-vitamin D (OSCAL WITH D) 250-125 MG-UNIT per tablet  Take 1 tablet by mouth daily.     .  carvedilol (COREG) 12.5 MG tablet  Take 2 tablets (25 mg total) by mouth 2 (two) times daily.  120 tablet  6   .  cetirizine (ZYRTEC) 10 MG tablet  Take 10 mg by mouth daily as needed. For seasonal allergies     .  Dexlansoprazole (DEXILANT) 60 MG capsule  Take 60 mg by mouth every morning. Take 15 minutes before breakfast.     .  DIOVAN 80 MG tablet  TAKE 2 TABLETS 2 TIMES A DAY DAY.  120 tablet  5   .   furosemide (LASIX) 20 MG tablet  TAKE ONE (1) TABLET EACH DAY  30 tablet  2   .  insulin glargine (LANTUS SOLOSTAR) 100 UNIT/ML injection  Inject 42 Units into the skin daily.  15 mL  12   .  lidocaine-prilocaine (EMLA) cream  Apply a thumbnail size to port site at least one prior to procedures. Cover with plastic wrap.  30 g  1   .  metFORMIN (GLUCOPHAGE) 850 MG tablet  Take 850 mg by mouth 3 (three) times daily.     .  mometasone (NASONEX) 50 MCG/ACT nasal spray  Place 2 sprays into the nose daily. scheduled     .  Multiple Vitamin (MULTIVITAMIN WITH MINERALS) TABS  Take 1 tablet by mouth daily.     Melissa Villa  OVER THE COUNTER MEDICATION  Take 1 tablet by mouth daily. Otc.Ferrous 45mg      .  oxyCODONE (OXY IR/ROXICODONE) 5 MG immediate release tablet  Take 1-2 tablets (5-10 mg total) by mouth every 6 (six) hours as needed for pain.  60 tablet  0   .  potassium chloride (K-DUR) 10 MEQ tablet  Please do not take this medication until I call you and tell you how to take it.  20 tablet  0   .  pregabalin (LYRICA) 200 MG capsule  Take 1 capsule (200 mg total) by mouth 2 (two) times daily.  60 capsule  1   .  rosuvastatin (CRESTOR) 5 MG tablet  Take 10 mg by mouth daily.     .  sitaGLIPtin (JANUVIA) 100 MG tablet  Take 100 mg by mouth daily.     Melissa Villa  tiotropium (SPIRIVA) 18 MCG inhalation capsule  Place 1 capsule (18 mcg total) into inhaler and inhale daily.  30 capsule  11   .  traMADol (ULTRAM) 50 MG tablet  Take 1 tablet (50 mg total) by mouth every 6 (six) hours as needed for pain (cough).  60 tablet  2    OBJECTIVE:  Middle-aged [place] American woman examined in an ICU bed, receiving oxygen through nasal cannula  Filed Vitals:   06/13/12 1700  BP: 157/56  Pulse: 110  Temp:   Resp: 29                     No cervical or supraclavicular adenopathy  Lungs show scattered rales, R>L, auscultated anteriorly Heart regular rate and rhythm  Abd obese, soft, positive  bowel sounds  Neuro: nonfocal;  well oriented; appropriate affect Breasts: the Right breast mass has developed some eschar formation which is generally a sign of response to treatment; the breast remains firm, fixed, and flat --clearly not operable  LAB RESULTS:  Results for Melissa Villa, Melissa Villa (MRN [id number]) as of 06/13/2012 16:42  Ref. Range 06/13/2012 04:00  Sodium Latest Range: 136-145 mEq/L 140  Potassium Latest Range: 3.5-5.1 mEq/L 4.4  Chloride Latest Range: 96-112 mEq/L 107  CO2 Latest Range: 19-32 mEq/L 25  BUN Latest Range: 7.0-26.0 mg/dL 23  Creatinine Latest Range: 0.50-1.10 mg/dL 4.09  Calcium Latest Range: 8.4-10.5 mg/dL 8.8  GFR calc non Af Amer Latest Range: >90 mL/min 54 (L)  GFR calc Af Amer Latest Range: >90 mL/min 63 (L)  Glucose Latest Range: 70-99 mg/dl 811 (H)  Phosphorus Latest Range: 2.3-4.6 mg/dL 3.5  Magnesium Latest Range: 1.5-2.5 mg/dL 1.6  Pro B Natriuretic peptide (BNP) Latest Range: 0-125 pg/mL 661.0 (H)  WBC Latest Range: 4.0-10.5 K/uL 8.1  RBC Latest Range: 3.87-5.11 MIL/uL 3.66 (L)  Hemoglobin Latest Range: 12.0-15.0 g/dL 7.2 (L)  HCT Latest Range: 36.0-46.0 % 26.1 (L)  MCV Latest Range: 78.0-100.0 fL 71.3 (L)  MCH Latest Range: 26.0-34.0 pg 19.7 (L)  MCHC Latest Range: 30.0-36.0 g/dL 91.4 (L)  RDW Latest Range: 11.5-15.5 % 24.9 (H)  Platelets Latest Range: 150-400 K/uL 306  Neutrophils Relative Latest Range: 43-77 % 62  Lymphocytes Relative Latest Range: 12-46 % 23  Monocytes Relative Latest Range: 3-12 % 11  Eosinophils Relative Latest Range: 0-5 % 4  Basophils Relative Latest Range: 0-1 % 0  NEUT# Latest Range: 1.7-7.7 K/uL 5.0  Lymphocytes Absolute Latest Range: 0.7-4.0 K/uL 1.9  Monocytes Absolute Latest Range: 0.1-1.0 K/uL 0.9  Eosinophils Absolute Latest Range: 0.0-0.5 10e3/uL 0.3  Basophils Absolute Latest Range: 0.0-0.1 K/uL 0.0  RBC Morphology No range found POLYCHROMASIA PRESENT  Smear Review No range found LARGE PLATELETS PRESENT  Heparin Unfractionated Latest  Range: 0.30-0.70 IU/mL 0.21 (L)     ASSESSMENT: 67 y.o. [place] woman status post -   (1) Left lumpectomy and axillary lymph node dissection December 1992 for a T1N0 medullary breast cancer treated with tamoxifen for 2 years.   (2) Status post right lumpectomy and axillary lymph node dissection August 1995 for a T1N0 microinvasive breast cancer treated with Arimidex for 5 years.   (3) Adjuvantly she was treated with right breast tangents to a dose of 5040 cGy and her tumor bed was boosted for a further 1000 cGy in 5 sessions, completing all therapy in 02/12/1994. [Of note she had a right axillary dissection and all 20 lymph nodes were negative for metastatic disease].   (4) Status post right lower lobe lobectomy February 2003 for a 5 mm lung carcinoid.   (5) Right breast biopsy 01/19/2012 shows an invasive ductal carcinoma, grade 2, estrogen and progesterone receptor positive, with an elevated MIB-1, HER-2 amplified with a ratio of 2.29 by CISH.   (6) Clinically the tumor extends over most of the breast and into the surrounding chest wall skin. Radiation oncology feels at most 20 gray additional radiation could be given to this area, which is unlikely to be of benefit, and therefore this is not contemplated.   (7) initial staging studies including bone scan, and CT scans of the head, chest, abdomen, and pelvis, showed no definite areas of distant disease (there were some mediastinal and bilateral hilar lymph nodes and some right adrenal range is of uncertain significance).   (6)  fulvestrant started 01/28/2012, repeated monthly, most recent dose 06/07/2012 (  (7) trastuzumab started 04/07/2012, held after 05/05/2012 dose because of the patient's worsening pulmonary situation; echo 02/20/2012 showed an EF of 55%; results of echo today pending  PLAN: Rasheena may have metastatic spread to bone, liver and right lung; however we do not have pathologic proof of this. Her tumor is HER-2 positive and  likely to grow rapidly is anti-HER-2 treatment is not given. If her echo shows a well-preserved ejection fraction we can consider TDM-1, which is less potentially cardiotoxic than trastuzumab. We can continue the fulvestrant monthly for ER-blockade. She is not a good candidate for chemotherapy given her multiple comorbidities, though we might be able to give her some Abraxane before neuropathy became a significant problem. We can use denosumab for the bone lesions  It would not be unreasonable for Karsynn to decide for palliative/comfort care at this point but that is not where she is. She tells me if she had to be intubated again she would want it done. She wants her breast cancer treated and certainly there is efficacious treatment that can control her cancer. She understand this may lengthen her life but may not improve her QOL.   I do not want to needle her liver or do a bone biopsy, but if she needed endoscopy for any reason or if thoracentesis were planned to help her breathing we might be able to get cytologic confirmation of what we are dealing with, if indeed all or some of it is cancer. I will review the echo results tomorrow and discuss with Drs Marchelle Gearing and Meredith Pel as well as the patient before proceeding with any breast-cancer specific treatment  Appreciate your help to this patient!

## 2012-06-13 NOTE — Progress Notes (Signed)
Taken off bipap for a break after treatment was given.

## 2012-06-13 NOTE — Progress Notes (Signed)
Placed back on bipap due to increased work of breathing

## 2012-06-14 ENCOUNTER — Inpatient Hospital Stay (HOSPITAL_COMMUNITY): Payer: Medicare Other

## 2012-06-14 LAB — BASIC METABOLIC PANEL
BUN: 20 mg/dL (ref 6–23)
Calcium: 9.5 mg/dL (ref 8.4–10.5)
Creatinine, Ser: 1.13 mg/dL — ABNORMAL HIGH (ref 0.50–1.10)
GFR calc Af Amer: 58 mL/min — ABNORMAL LOW (ref >90)
GFR calc non Af Amer: 50 mL/min — ABNORMAL LOW (ref >90)
Glucose, Bld: 160 mg/dL — ABNORMAL HIGH (ref 70–99)

## 2012-06-14 LAB — CBC WITH DIFFERENTIAL/PLATELET
Basophils Absolute: 0 10*3/uL (ref 0.0–0.1)
Basophils Relative: 0 % (ref 0–1)
Eosinophils Absolute: 0.3 10*3/uL (ref 0.0–0.7)
Hemoglobin: 7.6 g/dL — ABNORMAL LOW (ref 12.0–15.0)
Lymphocytes Relative: 23 % (ref 12–46)
MCH: 19.7 pg — ABNORMAL LOW (ref 26.0–34.0)
MCHC: 27.8 g/dL — ABNORMAL LOW (ref 30.0–36.0)
Monocytes Absolute: 0.9 10*3/uL (ref 0.1–1.0)
Neutrophils Relative %: 64 % (ref 43–77)
RDW: 24.7 % — ABNORMAL HIGH (ref 11.5–15.5)

## 2012-06-14 LAB — GLUCOSE, CAPILLARY
Glucose-Capillary: 153 mg/dL — ABNORMAL HIGH (ref 70–99)
Glucose-Capillary: 212 mg/dL — ABNORMAL HIGH (ref 70–99)
Glucose-Capillary: 172 mg/dL — ABNORMAL HIGH (ref 70–99)
Glucose-Capillary: 114 mg/dL — ABNORMAL HIGH (ref 70–99)

## 2012-06-14 LAB — MAGNESIUM: Magnesium: 1.7 mg/dL (ref 1.5–2.5)

## 2012-06-14 LAB — GLUCOSE, SEROUS FLUID: Glucose, Fluid: 224 mg/dL

## 2012-06-14 LAB — HEPARIN LEVEL (UNFRACTIONATED): Heparin Unfractionated: 0.32 IU/mL (ref 0.30–0.70)

## 2012-06-14 LAB — LACTATE DEHYDROGENASE, PLEURAL OR PERITONEAL FLUID: LD, Fluid: 166 U/L — ABNORMAL HIGH (ref 3–23)

## 2012-06-14 MED ORDER — SODIUM CHLORIDE 0.9 % IV SOLN
INTRAVENOUS | Status: DC
  Administered 2012-06-14: 19:00:00 via INTRAVENOUS

## 2012-06-14 MED ORDER — POTASSIUM CHLORIDE CRYS ER 20 MEQ PO TBCR: 40.0000 meq | EXTENDED_RELEASE_TABLET | Freq: Once | ORAL | Status: DC

## 2012-06-14 MED ORDER — FUROSEMIDE 10 MG/ML IJ SOLN
40.0000 mg | Freq: Once | INTRAMUSCULAR | Status: AC
  Administered 2012-06-14: 40 mg via INTRAVENOUS
  Filled 2012-06-14: qty 4

## 2012-06-14 MED ORDER — MAGNESIUM SULFATE 50 % IJ SOLN: 1.0000 g | Freq: Once | INTRAVENOUS | Status: DC

## 2012-06-14 MED ORDER — MAGNESIUM SULFATE IN D5W 10-5 MG/ML-% IV SOLN
1.0000 g | Freq: Once | INTRAVENOUS | Status: AC
  Administered 2012-06-14: 1 g via INTRAVENOUS
  Filled 2012-06-14: qty 100

## 2012-06-14 NOTE — Progress Notes (Signed)
Was called to room by Patients son Thayer Ohm and asked to answer questions about the upcoming procedure and clarify questions they had from conversation.  Patient reported that she wanted to do this procedure and was willing to go back on the ventilator for it but wanted to have plan of care focus of getting home as soon as possible to be with family.  Discussed option of home with Hospice as a plan of care and informed her we would support her decisions.  Patient and son expressed they were open to Hospice care as an option after the procedure.   Jacqulyn Cane

## 2012-06-14 NOTE — Care Management Note (Signed)
    Page 1 of 2   06/22/2012     9:01:45 AM   CARE MANAGEMENT NOTE 06/22/2012  Patient:  Melissa Villa, Melissa Villa   Account Number:  [id number]  Date Initiated:  06/12/2012  Documentation initiated by:  [facility]  Subjective/Objective Assessment:   Readmitted with resp failure - intubated.     Action/Plan:   Anticipated DC Date:  06/21/2012   Anticipated DC Plan:  SKILLED NURSING FACILITY  In-house referral  Clinical Social Worker      DC Planning Services  CM consult      Choice offered to / List presented to:             Status of service:  Completed, signed off Medicare Important Message given?   (If response is "NO", the following Medicare IM given date fields will be blank) Date Medicare IM given:   Date Additional Medicare IM given:    Discharge Disposition:  SKILLED NURSING FACILITY  Per UR Regulation:  Reviewed for med. necessity/level of care/duration of stay  If discussed at Long Length of Stay Meetings, dates discussed:    Comments:  Contact:  Harris,Chris Son [phone]  06/22/12 9:01 Letha Cape RN, BSN [phone] patient dc to SNF , CSW following.  06-19-12  1:10pm Avie Arenas, RNBSN [phone] Patient/family has changed mind about going home.  Wants everything done so hospice not an option at this time. Now wanting rehab prior to going home as lives alone, PT recommending SNF.  Referral made to SW.  CM for [facility] referral placed - Patient unable to get up out of chair by self.  States so her self - will be going home alone.  Has sons but will not be there 24/7 and understands that [facility] will not be with her all the time.  Agrees this is not a safe discharge - agreeable to SNF.  Talked with SW. SW will talk with her.   06-15-12 2pm Avie Arenas, RNBSN [phone] Informed by physician and Director that patient wants to go home tomorrow or Friday with hospice.  Offered choices - to three sons and patient - choose palliative and hospice of [place].  Referral  made.   06-14-12 11:35am Johny Shears - [phone] Now extubated - still requiring bipap.  Onocology saw yesterday - Stage IV mets to bone and liver, but patient not willing to give up, yet. May need to be reintubated again.  Onocologist offering a medication that can be taken as OP only so will need to get out of hospital prior to getting.  Will she be able to go to SNF with expensive chemo drug vs going home alone with [facility]?????  06-12-12 2:35pm Avie Arenas, RNBSN - [phone] Extubated today on cpap.  Is active prior to admission with [facility].

## 2012-06-14 NOTE — Progress Notes (Signed)
Patient taken off bipap and placed on 4LNC. BIPAP on standby at bedside. RT will continue to monitor

## 2012-06-14 NOTE — Progress Notes (Signed)
Patient placed back on bipap. RT will continue to monitor

## 2012-06-14 NOTE — Progress Notes (Signed)
PULMONARY  / CRITICAL CARE MEDICINE  Name: Melissa Villa MRN: [id number] DOB: 1945-09-27    ADMISSION DATE:  06/09/2012 CONSULTATION DATE:  06/09/12   REFERRING MD :  IM TS  CHIEF COMPLAINT:  R Effusion, Resp Failure  BRIEF PATIENT DESCRIPTION: 67 y/o F with PMH of invasive ductal breast cancer, carcinoid of RLL, diastolic heart failure, chronic obstructive pulmonary disease, diabetes, and complete heart block requiring dual-chamber pacemaker who presented to [facility] on 2/21 after multiple falls at home, increasing shortness of breath. Found to have worsening of R pleural effusion.  Placed on BiPAP for support and intubated.    LINES / TUBES: Porta cath>>> Chest tube 2/13>>>out pre dc 2/17;/14 .... 2/22 ett>>> 06/11/2012, BiPAP 06/12/2012 >>    CULTURES: Urine 2/22>>> negative MRSa pos BC 2/22>>> no growth as 06/13/2012 cdiff 2/23>>> NEG   Recent Labs Lab 06/09/12 1732  LATICACIDVEN 1.0     ANTIBIOTICS: Vanco 2/21>>>2/21 Zosyn 2/21>>>2/21 Flagyl (hx of C-DifF)2/21 >>> 06/12/2012 [C. difficile negative]     SIGNIFICANT EVENTS / STUDIES:  2/5-2/17 - admit for acute on chronic resp. Failure possible HCAP, right leg DVT, C.diff, AKI, and CT guided drainage of pleural effusion.   Doppler legs 05/26/12 - Findings consistent with acute deep vein thrombosis involving the right popliteal vein and right posterior tibial veins, and deep vein thrombosis of indeterminate age involving the right femoral vein.  Pleural fluid 05/31/12 - CYTOLOGY NEG FOR MALIGN CELLS but POSITIVE FOR INFLMN .......................................................................... 2/21 - Admit with resp failure, worsening effusion, bleeding from chest tube site 2/21 head CT - neg acute 2/21- neck CT- neg acute fx, Mixed lytic sclerotic appearance of the C6 vertebral body.This is highly suspicious for metastatic disease. Further evaluation with MRI is recommended. 2/22 renal US - neg hydro 2/23-  redcution in effusion on pcxr 06/12/2012: Extubated yesterday but complaining of increasing respiratory distress today. We will start BiPAP  06/13/2012: Continues to have mild to moderate respiratory distress and dyspnea. CT chest only shows small loculated pleural effusion that is too small for intervention. However investigations are revealing multiple metastasis including liver, potential lung, and spinous    SUBJECTIVE/OVERNIGHT/INTERVAL HX 06/14/12: Oncolugy seen - poor prognosis, recommends hospice but patient wants intubation and full medical care. Now with recurrent acute resp failure/distress  VITAL SIGNS: Temp:  [97.9 F (36.6 C)-99.1 F (37.3 C)] 98.8 F (37.1 C) (02/26 0749) Pulse Rate:  [63-110] 95 (02/26 0803) Resp:  [17-32] 29 (02/26 0803) BP: (109-178)/(38-114) 144/61 mmHg (02/26 0803) SpO2:  [89 %-100 %] 98 % (02/26 0803) FiO2 (%):  [40 %-50 %] 40 % (02/26 0803) Weight:  [127.4 kg (280 lb 13.9 oz)] 127.4 kg (280 lb 13.9 oz) (02/26 0500)  PHYSICAL EXAMINATION: General:  Morbidly obese in NAd Neuro:  rass 0 CAM-ICU negative for delirium HEENT: ett Cardiovascular:  s1s2 rrr, no m/r/g Lungs: Coarse crackles, that are scattered,. RR 35 and mildy paradoxical. Unable to complete sentence Abdomen:  Obese/soft, bsx4 active Musculoskeletal:  No acute deformities Skin:  Warm/dry, R breast with old small incision site from previous chest tube oozing blood, changes c/w radiation   Recent Labs Lab 06/12/12 0430 06/13/12 0400 06/14/12 0300  NA 142 140 140  K 3.7 4.4 5.4*  CL 110 107 107  CO2 26 25 29   BUN 30* 23 20  CREATININE 1.06 1.06 1.13*  GLUCOSE 150* 239* 160*    Recent Labs Lab 06/12/12 0430 06/13/12 0400 06/14/12 0300  HGB 7.5* 7.2* 7.6*  HCT 26.8* 26.1*  27.3*  WBC 7.2 8.1 8.5  PLT 328 306 301   Ct Chest Wo Contrast  06/12/2012  *RADIOLOGY REPORT*  Clinical Data: Right pleural effusion.  Shortness of breath.  CT CHEST WITHOUT CONTRAST  Technique:   Multidetector CT imaging of the chest was performed following the standard protocol without IV contrast.  Comparison: Chest CT 05/25/2012.  Findings:  Mediastinum: Heart size is mildly enlarged. Small amount of pericardial fluid and/or thickening, unchanged and unlikely to be of hemodynamic significance at this time.  Left-sided pacemaker device in place with lead tips terminating in the right atrium and right ventricular apex. There is atherosclerosis of the thoracic aorta, the great vessels of the mediastinum and the coronary arteries, including calcified atherosclerotic plaque in the left main, left anterior descending, left circumflex and right coronary arteries. Numerous reactive sized mediastinal and hilar lymph nodes are noted. Please note that accurate exclusion of hilar adenopathy is limited on noncontrast CT scans.   Esophagus is unremarkable in appearance.  Lungs/Pleura: A complex multilocular right-sided pleural effusion, moderate to large in size, similar to prior examination.  Extensive thickening of the peribronchovascular interstitium throughout the right lung, particularly in the perihilar region.  Improvement in atelectasis in the left lower lobe noted on the prior examination. No new consolidative airspace disease.  Upper Abdomen: 3.3 x 2.4 cm ill-defined low attenuation lesion in the left lobe of the liver is incompletely characterized on this noncontrast CT examination.  This appears to be new compared to prior examinations.  Bilateral perinephric stranding.  Extensive atherosclerosis.  Musculoskeletal: Old healed fracture of the lateral aspect of the left fifth rib and the lateral aspect of the right third and seventh ribs.  A small sclerotic lesion in the lateral aspect of the right second rib is favored to represent a bone island. Multiple other less well defined sclerotic lesions are noted throughout the thoracic spine, most notably in the posterolateral aspect of the T7 vertebral body on  the right extending toward the pedicle.  Surgical clips in the axillary regions bilaterally, compatible with prior nodal dissection.  IMPRESSION: 1.  Complex multilocular right-sided pleural effusion is very similar in size to the prior examination. 2.  Extensive peribronchovascular interstitial thickening throughout the right lung, most pronounced in the right perihilar region.  The appearance is unusual and could suggest the presence of lymphangitic spread of malignancy.  Clinical correlation is recommended, with consideration for further evaluation with PET CT to exclude malignancy if clinically indicated. 3.  At time of follow-up imaging, attention to the multiple sclerotic lesions in the spine is recommended, as sclerotic metastases could have a similar appearance. 4.  New low attenuation lesion in the left lobe of the liver measuring 3.4 x 2.4 cm.  If there is an underlying malignancy, this could certainly represent a metastatic lesion.  Alternatively, clinical correlation for signs and symptoms of infection may be warranted.  This could be further evaluated with dedicated abdominal CT scan with contrast. 5.  Additional incidental findings, similar to prior studies, as above.   Original Report Authenticated By: Trudie Reed, M.D.     Principal Problem:   Respiratory distress Active Problems:   DIABETES MELLITUS, TYPE II   OBSTRUCTIVE SLEEP APNEA   HYPERTENSION   CONGESTIVE HEART FAILURE   COPD   Pleural effusion, right   HCAP (healthcare-associated pneumonia)   Right leg DVT   Altered mental status    ASSESSMENT / PLAN:   Acute Respiratory Failure - recent admit for ?  HCAP vs malignant chest findings / neg cytology O2 Dependent COPD  OSA Pleural Effusion - chest tube from 2/7-2/17 placed by IR.  Peripheral smear/cytology with no malignant cells.  Concern for hemothorax with bleeding from old anterior chest tube site.  ? If communication to pleural space. Pulmonary HTN - PA on ECHO  104  - 06/12/2012: . Small loculated right effusion that according to Dr. Tyrone Sage is too small for intervention    - 06/14/12: Worsening resp distress despite morphine/lasix for dyspnea  Plan: - BiPAP aand lasix, if does not respond intubate - IR guided thora (likely after intubation,  Hold heparin) - will attempt (patient and son explained risks)    HTN Chronic Diastolic CHF - ECHO 05/25/12 with nml systolic fxn, EF 16-10%, PA peak 104  Recent Labs Lab 06/09/12 1732 06/12/12 1100 06/13/12 0400 06/14/12 0300  PROBNP 1443.0* 637.0* 661.0* 710.1*    No results found for this basename: TROPONINI,  in the last 168 hours   Plan: - Saline lock IV -hold home anti-hypertensives (ARB) -monitor BP -tele - Continue Lasix since 06/13/2012   Acute Renal Failure  Recent Labs Lab 06/10/12 0630 06/11/12 0520 06/12/12 0430 06/13/12 0400 06/14/12 0300  NA 140 140 142 140 140  K 4.4 3.7 3.7 4.4 5.4*  CL 106 106 110 107 107  CO2 25 26 26 25 29   GLUCOSE 75 147* 150* 239* 160*  BUN 32* 42* 30* 23 20  CREATININE 2.30* 1.72* 1.06 1.06 1.13*  CALCIUM 8.0* 8.9 8.8 8.8 9.5  MG  --   --   --  1.6 1.7  PHOS  --   --   --  3.5 3.0    ARF resolving Admit wth 2.13 in setting of metformin / ARB.  On 06/13/2012 acute renal failure is resolved but she has hypomagnesemia On 06/13/12: creat worse after lasix 06/13/13 with high K  Plan: -hold hydration -continuestart Lasix since 06/13/2012 but if creat worsens, then dc lasix - Replace mag  Diabetes Mellitus - on metformin at baseline, may not be best choice given hx of renal insufficency  Plan: -npo due to resp distress  Hematology and oncology  Recent Labs Lab 06/12/12 0430 06/13/12 0400 06/14/12 0300  HGB 7.5* 7.2* 7.6*  HCT 26.8* 26.1* 27.3*  WBC 7.2 8.1 8.5  PLT 328 306 301     Hx of Invasive Ductal Breast Cancer - followed by Dr. [name].  Previously was on Herceptin but was d/c'd out of concern for CHF.  Now on  Fulvestrant. 06/09/2012 : CT scan shows possible C6 vertebral metastasis, liver met, and ? lymphangitis Carcinoid RLL  R DVT - (05/2012) on full dose lovenox Anemia - no significant drop in Hgb   - 06/13/2012: Evidence of progression of her breast cancer to stage IV.  - 06/14/12 - no change. Dr [name] recommend hospice but patient declined. He can try option of HER Mab in office as palliative Rx and patient very keen on it  Plan: - hold heparin drip maintain for DVT due to IR thora (at some point we'll switch to Lovenox] - - Pack red cells for hemoglobin less than 7 g percent   Neurologic Acute Encephalopathy - in setting of respiratory distress, acute renal failure Falls - multiple falls in recent months.  Generalized decline.    Plan: -fall precautions  Infectious diseases  Recent Labs Lab 06/09/12 1732  LATICACIDVEN 1.0    Hx of Recent C-Diff but  06/11/2012 C. difficile negative Rule  out Pulmonary Infection - low clinical suspicion for infectious pulmonary process.    - Off antibiotics as a 06/12/2012  Plan: -Check BAL if intubated  - Do PCT algorithm  -    Global  - Discussed with  Dr. [name]  - On 2/26: PCCM identified son Thayer Ohm as main health care surrogate. He was updated at bedside. He was surprised and sad to learn to stage 4 breast cancer. Patient very keen on getting better to get the palliative mab in onc office. Explained that current illness precludes her from gettting that and unsure and unlikely she will get better to get that. She still insists full medical care and intubation. Son supporting patient decision and directions from MD/RN. Has 2 other brothers - they all look to his leadership per him  The patient is critically ill with multiple organ systems failure and requires high complexity decision making for assessment and support, frequent evaluation and titration of therapies, application of advanced monitoring technologies and extensive  interpretation of multiple databases.   Critical Care Time devoted to patient care services described in this note is  60  Minutes.  Dr. Kalman Shan, M.D., [facility].C.P Pulmonary and Critical Care Medicine Staff Physician [facility] [place] Pulmonary and Critical Care Pager: [phone], If no answer or between  15:00h - 7:00h: call [phone]  06/14/2012 8:25 AM

## 2012-06-14 NOTE — Progress Notes (Signed)
ANTICOAGULATION CONSULT NOTE  Pharmacy Consult for Heparin Indication: Hx DVT  No Known Allergies  Patient Measurements: Height: 5\' 3"  (160 cm) Weight: 280 lb 13.9 oz (127.4 kg) IBW/kg (Calculated) : 52.4 Heparin Dosing Weight: 89 kg  Vital Signs: Temp: 98.5 F (36.9 C) (02/26 1548) Temp src: Oral (02/26 1548) BP: 150/56 mmHg (02/26 1900) Pulse Rate: 103 (02/26 1900)  Labs:  Recent Labs  06/12/12 0430 06/13/12 0400 06/13/12 1100 06/13/12 1947 06/14/12 0300  HGB 7.5* 7.2*  --   --  7.6*  HCT 26.8* 26.1*  --   --  27.3*  PLT 328 306  --   --  301  HEPARINUNFRC 0.31 0.21* 0.11* 0.22* 0.32  CREATININE 1.06 1.06  --   --  1.13*    Estimated Creatinine Clearance: 65.4 ml/min (by C-G formula based on Cr of 1.13).  Assessment: 67 y.o. female with recent DVT, s/p recent chest tube (2/7-2/17 for pleural effusion), as well as previous bleeding from CT site.  Heparin level was therapeutic (0.32) at rate of 1300 units/hr this am. No problem with line / infusion and no bleeding per RN.   Since this AM at ~1000, patient has been off heparin drip d/t plans for IR guided thoracentesis. Plan is to intubate the patient first.  Patient s/p thoracentesis, Dr Marchelle Gearing gave OK to resume heparin now.  Goal of Therapy:  Heparin level 0.3-0.7 units/ml Monitor platelets by anticoagulation protocol: Yes   Plan:  - Resume heparin at 1300 units/hr - Check 8h level after restart - Continue daily level and CBC  Dalayah Deahl L. Illene Bolus, PharmD, BCPS Clinical Pharmacist Pager: [phone] Pharmacy: [phone] 06/14/2012 7:28 PM

## 2012-06-14 NOTE — Progress Notes (Signed)
ANTICOAGULATION CONSULT NOTE  Pharmacy Consult for Heparin Indication: Hx DVT  No Known Allergies  Patient Measurements: Height: 5\' 3"  (160 cm) Weight: 280 lb 13.9 oz (127.4 kg) IBW/kg (Calculated) : 52.4 Heparin Dosing Weight: 89 kg  Vital Signs: Temp: 98.8 F (37.1 C) (02/26 0749) Temp src: Oral (02/26 0749) BP: 149/71 mmHg (02/26 1205) Pulse Rate: 107 (02/26 1205)  Labs:  Recent Labs  06/12/12 0430 06/13/12 0400 06/13/12 1100 06/13/12 1947 06/14/12 0300  HGB 7.5* 7.2*  --   --  7.6*  HCT 26.8* 26.1*  --   --  27.3*  PLT 328 306  --   --  301  HEPARINUNFRC 0.31 0.21* 0.11* 0.22* 0.32  CREATININE 1.06 1.06  --   --  1.13*    Estimated Creatinine Clearance: 65.4 ml/min (by C-G formula based on Cr of 1.13).  Assessment: 67 y.o. female with recent DVT, s/p recent chest tube (2/7-2/17 for pleural effusion), as well as previous bleeding from CT site.  Heparin level was therapeutic (0.32) at rate of 1300 units/hr. No problem with line / infusion and no bleeding per RN.   Since this AM at ~1000, patient has been off heparin drip d/t plans for IR guided thoracentesis. Plan is to intubate the patient first.  Goal of Therapy:  Heparin level 0.3-0.7 units/ml Monitor platelets by anticoagulation protocol: Yes   Plan:  - Will f/up post thoracentesis to resume anticoagulation- noted Dr. Marchelle Gearing is contemplating full dose Lovenox at some point instead of IV heparin.  Thanks, Melissa Villa, PharmD, BCPS.  Clinical Pharmacist Pager [phone]. 06/14/2012 2:10 PM

## 2012-06-14 NOTE — Procedures (Signed)
Portable US thora RIGHT clear amber fluid, samples for requested labs CXR to follow No complication No blood loss. See complete dictation in [facility].

## 2012-06-14 NOTE — Progress Notes (Signed)
Patient evaluated for community based chronic disease management services with [facility] Care Management Program as a benefit of patient's [facility] Medicare Insurance.  Spoke with patient at bedside to explain [facility] Care Management services.  Patient is unsure if she would benefit from services at this time.  Will follow up with Avie Arenas RN CM to confirm patient level of support and involvement with the [facility] resources.  Of note, [facility] Care Management services does not replace or interfere with any services that are arranged by inpatient case management or social work.  For additional questions or referrals please contact Anibal Henderson BSN RN [facility] Liaison at [phone].

## 2012-06-14 NOTE — Progress Notes (Signed)
Provided support during conversation with Dr. Marchelle Gearing, the patient, and the patients son Thayer Ohm.  During this meeting the patient gave verbal consent for the Medical team to provide an update to her son Thayer Ohm regarding he health status and plan of care.  Patient on BiPap and removed so she could participate in conversation and was found to have a paradoxical breathing pattern.  Dr. Marchelle Gearing  Informed her that he was going to need to re-intubate her prior to her procedure.   The patient identifies her son Thayer Ohm has her primary Management consultant.  Offered support and informed them I was available to answer questions as they arose.   Jacqulyn Cane, RN

## 2012-06-15 LAB — CBC WITH DIFFERENTIAL/PLATELET
Eosinophils Relative: 3 % (ref 0–5)
Hemoglobin: 7.7 g/dL — ABNORMAL LOW (ref 12.0–15.0)
Lymphocytes Relative: 19 % (ref 12–46)
Monocytes Absolute: 0.6 10*3/uL (ref 0.1–1.0)
Monocytes Relative: 7 % (ref 3–12)
Neutrophils Relative %: 71 % (ref 43–77)
Platelets: 254 10*3/uL (ref 150–400)
RBC: 3.9 MIL/uL (ref 3.87–5.11)
WBC: 8.8 10*3/uL (ref 4.0–10.5)

## 2012-06-15 LAB — BASIC METABOLIC PANEL
Calcium: 9.5 mg/dL (ref 8.4–10.5)
GFR calc Af Amer: 60 mL/min — ABNORMAL LOW (ref >90)
GFR calc non Af Amer: 52 mL/min — ABNORMAL LOW (ref >90)
Potassium: 4.6 mEq/L (ref 3.5–5.1)
Sodium: 138 mEq/L (ref 135–145)

## 2012-06-15 LAB — CULTURE, BLOOD (ROUTINE X 2): Culture: NO GROWTH

## 2012-06-15 LAB — HEPARIN LEVEL (UNFRACTIONATED): Heparin Unfractionated: 0.38 IU/mL (ref 0.30–0.70)

## 2012-06-15 LAB — GLUCOSE, CAPILLARY
Glucose-Capillary: 196 mg/dL — ABNORMAL HIGH (ref 70–99)
Glucose-Capillary: 338 mg/dL — ABNORMAL HIGH (ref 70–99)
Glucose-Capillary: 178 mg/dL — ABNORMAL HIGH (ref 70–99)

## 2012-06-15 LAB — PHOSPHORUS: Phosphorus: 3.7 mg/dL (ref 2.3–4.6)

## 2012-06-15 MED ORDER — INSULIN GLARGINE 100 UNIT/ML ~~LOC~~ SOLN
20.0000 [IU] | Freq: Every day | SUBCUTANEOUS | Status: DC
  Administered 2012-06-15 – 2012-06-20 (×6): 20 [IU] via SUBCUTANEOUS

## 2012-06-15 MED ORDER — METFORMIN HCL 850 MG PO TABS
850.0000 mg | ORAL_TABLET | Freq: Two times a day (BID) | ORAL | Status: DC
  Administered 2012-06-15 – 2012-06-18 (×6): 850 mg via ORAL
  Filled 2012-06-15 (×8): qty 1

## 2012-06-15 MED ORDER — SPIRONOLACTONE 50 MG PO TABS
50.0000 mg | ORAL_TABLET | Freq: Every day | ORAL | Status: DC
  Administered 2012-06-15 – 2012-06-21 (×7): 50 mg via ORAL
  Filled 2012-06-15 (×7): qty 1

## 2012-06-15 MED ORDER — FUROSEMIDE 40 MG PO TABS
40.0000 mg | ORAL_TABLET | Freq: Two times a day (BID) | ORAL | Status: DC
  Administered 2012-06-15 – 2012-06-18 (×6): 40 mg via ORAL
  Filled 2012-06-15 (×8): qty 1

## 2012-06-15 MED ORDER — ALBUTEROL SULFATE (5 MG/ML) 0.5% IN NEBU
2.5000 mg | INHALATION_SOLUTION | RESPIRATORY_TRACT | Status: DC | PRN
  Administered 2012-06-19: 2.5 mg via RESPIRATORY_TRACT
  Filled 2012-06-15: qty 0.5

## 2012-06-15 MED ORDER — FUROSEMIDE 40 MG PO TABS
40.0000 mg | ORAL_TABLET | ORAL | Status: AC
  Administered 2012-06-15: 40 mg via ORAL
  Filled 2012-06-15: qty 1

## 2012-06-15 MED ORDER — ALBUTEROL SULFATE (5 MG/ML) 0.5% IN NEBU
2.5000 mg | INHALATION_SOLUTION | Freq: Four times a day (QID) | RESPIRATORY_TRACT | Status: DC
  Administered 2012-06-15 – 2012-06-18 (×12): 2.5 mg via RESPIRATORY_TRACT
  Filled 2012-06-15 (×11): qty 0.5

## 2012-06-15 NOTE — Progress Notes (Signed)
PULMONARY  / CRITICAL CARE MEDICINE  Name: Melissa Villa MRN: [id number] DOB: 09/27/1945    ADMISSION DATE:  06/09/2012 CONSULTATION DATE:  06/09/12   REFERRING MD :  IM TS  CHIEF COMPLAINT:  R Effusion, Resp Failure  BRIEF PATIENT DESCRIPTION: 67 y/o F with PMH of invasive ductal breast cancer, carcinoid of RLL, diastolic heart failure, chronic obstructive pulmonary disease, diabetes, and complete heart block requiring dual-chamber pacemaker who presented to [facility] on 2/21 after multiple falls at home, increasing shortness of breath. Found to have worsening of R pleural effusion.  Placed on BiPAP for support and intubated.    LINES / TUBES: Portacath >> Chest tube 2/13>>>out pre dc 2/17;/14 2/22 ett>>> 06/11/2012    CULTURES: Urine 2/22>>> negative MRSa pos BC 2/22>>> no growth as 06/13/2012 cdiff 2/23>>> NEG   Recent Labs Lab 06/09/12 1732  LATICACIDVEN 1.0     ANTIBIOTICS: Vanco 2/21>>>2/21 Zosyn 2/21>>>2/21 Flagyl (hx of C-DifF)2/21 >>> 06/12/2012 [C. difficile negative]     SIGNIFICANT EVENTS / STUDIES:  2/5-2/17 - admit for acute on chronic resp. Failure possible HCAP, right leg DVT, C.diff, AKI, and CT guided drainage of pleural effusion.   Doppler legs 05/26/12 - Findings consistent with acute deep vein thrombosis involving the right popliteal vein and right posterior tibial veins, and deep vein thrombosis of indeterminate age involving the right femoral vein.  Pleural fluid 05/31/12 - CYTOLOGY NEG FOR MALIGN CELLS but POSITIVE FOR INFLMN 2/21 - Admit with resp failure, worsening effusion, bleeding from chest tube site 2/21 head CT - neg acute 2/21- neck CT- neg acute fx, Mixed lytic sclerotic appearance of the C6 vertebral body.This is highly suspicious for metastatic disease. Further evaluation with MRI is recommended. 2/22 renal US - neg hydro 2/23- redcution in effusion on pcxr 06/12/2012: Extubated yesterday but complaining of increasing respiratory  distress today. We will start BiPAP  06/13/2012: Continues to have mild to moderate respiratory distress and dyspnea. CT chest only shows small loculated pleural effusion that is too small for intervention. However investigations are revealing multiple metastasis including liver, potential lung, and spinous    SUBJECTIVE/OVERNIGHT/INTERVAL HX Off NPPV. No distress. Cognition intact. No new complaints  VITAL SIGNS: Temp:  [97.3 F (36.3 C)-99.4 F (37.4 C)] 98 F (36.7 C) (02/27 0800) Pulse Rate:  [81-115] 98 (02/27 1100) Resp:  [22-33] 27 (02/27 1100) BP: (128-165)/(53-120) 150/57 mmHg (02/27 1100) SpO2:  [82 %-100 %] 100 % (02/27 1116) FiO2 (%):  [40 %] 40 % (02/27 0600) Weight:  [125.5 kg (276 lb 10.8 oz)] 125.5 kg (276 lb 10.8 oz) (02/27 0456)  PHYSICAL EXAMINATION: General:  Morbidly obese in NAd Neuro:  rass 0 CAM-ICU negative for delirium HEENT: ett Cardiovascular:  s1s2 rrr, no m/r/g Lungs: No wheeze, diminished BS on R Abdomen:  Obese/soft, bsx4 active Musculoskeletal:  No acute deformities Skin:  Warm/dry, R breast with old small incision site from previous chest tube oozing blood, changes c/w radiation   Recent Labs Lab 06/13/12 0400 06/14/12 0300 06/15/12 0445  NA 140 140 138  K 4.4 5.4* 4.6  CL 107 107 102  CO2 25 29 31   BUN 23 20 18   CREATININE 1.06 1.13* 1.10  GLUCOSE 239* 160* 247*    Recent Labs Lab 06/13/12 0400 06/14/12 0300 06/15/12 0445  HGB 7.2* 7.6* 7.7*  HCT 26.1* 27.3* 27.7*  WBC 8.1 8.5 8.8  PLT 306 301 254   Dg Chest Port 1 View  06/14/2012  *RADIOLOGY REPORT*  Clinical Data: Status  post thoracentesis.  PORTABLE CHEST - 1 VIEW  Comparison: Chest x-ray 06/12/2012.  Findings: The right lateral pleural effusion is decreased. Persistent airspace disease is noted.  A smaller right pleural effusion is present.  Pacing wires and left IJ Port-A-Cath are stable.  Moderate pulmonary vascular congestion is unchanged.  Mild basilar atelectasis at  the left base is improved.  IMPRESSION:  1.  Status post right thoracentesis without evidence for pneumothorax. 2.  Decreased right pleural effusion. 3.  Persistent small right pleural effusion and lower lobe airspace disease.  While this may represent atelectasis, infection is not excluded. 4.  Similar pattern of mild pulmonary vascular congestion.   Original Report Authenticated By: Melissa Villa, M.D.     Principal Problem:   Respiratory distress Active Problems:   DIABETES MELLITUS, TYPE II   OBSTRUCTIVE SLEEP APNEA   HYPERTENSION   CONGESTIVE HEART FAILURE   COPD   Pleural effusion, right   HCAP (healthcare-associated pneumonia)   Right leg DVT   Altered mental status    ASSESSMENT / PLAN:   Acute Respiratory Failure - predominantly due to recurrent large R effusion O2 Dependence H/O OSA Recurrent R Pleural Effusion - repeat thoracentesis 2/26 Pulmonary HTN - PA on ECHO 104   Plan: - Stable for transfer to SDU - order placed - Cont intermittent NPPV with mandatory nocturnal support - If pleural effusion recurs, will consider PLeurX cath  HTN Chronic Diastolic CHF - ECHO 05/25/12 with nml systolic fxn, EF 16-10%, PA peak 104  Recent Labs Lab 06/09/12 1732 06/12/12 1100 06/13/12 0400 06/14/12 0300  PROBNP 1443.0* 637.0* 661.0* 710.1*    No results found for this basename: TROPONINI,  in the last 168 hours   Plan: - Change Lasix to PO - Add daily spironolactone  Acute Renal Failure  Recent Labs Lab 06/11/12 0520 06/12/12 0430 06/13/12 0400 06/14/12 0300 06/15/12 0445  NA 140 142 140 140 138  K 3.7 3.7 4.4 5.4* 4.6  CL 106 110 107 107 102  CO2 26 26 25 29 31   GLUCOSE 147* 150* 239* 160* 247*  BUN 42* 30* 23 20 18   CREATININE 1.72* 1.06 1.06 1.13* 1.10  CALCIUM 8.9 8.8 8.8 9.5 9.5  MG  --   --  1.6 1.7 1.5  PHOS  --   --  3.5 3.0 3.7    ARF resolving Admit wth 2.13 in setting of metformin / ARB.   Plan: -Monitor   Diabetes Mellitus -    Resume metformin and reduced dose of Lantus Cont SSI   Hematology and oncology  Recent Labs Lab 06/13/12 0400 06/14/12 0300 06/15/12 0445  HGB 7.2* 7.6* 7.7*  HCT 26.1* 27.3* 27.7*  WBC 8.1 8.5 8.8  PLT 306 301 254     Hx of Invasive Ductal Breast Cancer - followed by Dr. Darnelle Villa.  Previously was on Herceptin but was d/c'd out of concern for CHF.  Now on Fulvestrant. 06/09/2012 : CT scan shows possible C6 vertebral metastasis, liver met, and ? lymphangitis Carcinoid RLL  R DVT - (05/2012) on full dose lovenox Anemia - no significant drop in Hgb   - 06/13/2012: Evidence of progression of her breast cancer to stage IV.  - 06/14/12 - no change. Dr Melissa Villa recommend hospice but patient declined. He can try option of HER Mab in office as palliative Rx and patient very keen on it  Plan: - hold heparin drip maintain for DVT due to IR thora (at some point we'll switch to Lovenox] - -  Pack red cells for hemoglobin less than 7 g percent   Neurologic Acute Encephalopathy - in setting of respiratory distress, acute renal failure Falls - multiple falls in recent months.  Generalized decline.    Plan: -fall precautions  Infectious diseases  Recent Labs Lab 06/09/12 1732  LATICACIDVEN 1.0    Hx of Recent C-Diff but  06/11/2012 C. difficile negative Rule out Pulmonary Infection - low clinical suspicion for infectious pulmonary process.    - Off antibiotics as a 06/12/2012  Plan: -Check BAL if intubated  - Do PCT algorithm  -    Melissa Fischer, MD ; [facility] service Mobile [phone].  After 5:30 PM or weekends, call [phone]

## 2012-06-15 NOTE — Progress Notes (Signed)
Patient found off BIPAP, taken off by RN and placed on 4LNC. Patient tolerating well at this time

## 2012-06-15 NOTE — Progress Notes (Addendum)
Notified by Avie Arenas CMRN, patient/family request services of [facility]) after discharge; spoke with patient and sons Grafton Folk, and Darrien at bedside.  Sons have many questions for doctors; Thayer Ohm stated "I don't want to just throw in the towel" sons, Earvin Hansen and Darrien  are open to hospice at home; patient lives alone, though sons say they will be taking turns helping her; at this time Ms Vetsch stated "if there was something Dr [name] could give me that could extend the time I have, I would want it"  Patient and family at this time are asking to speak again with the doctors to come to an understanding of future options; son Thayer Ohm stated he has questions about "whether the fluid build up is from her heart and if this can be helped with medicine".  At this time family is not ready to commit to hospice and would like this RN to check back with them after they have had time to discuss with the doctors.  Will follow up tomorrow as requested by sons.  Valente David, RN 06/15/2012, 3:30 PM [facility] Palliative Medicine Team RN Liaison [phone]

## 2012-06-15 NOTE — Progress Notes (Signed)
NUTRITION FOLLOW UP  Intervention:   Continue current cardiac/heart healthy diet to meet nutrition needs.  Nutrition Dx:   Inadequate oral intake related to inability to eat as evidenced by NPO status, resolved.  New Goal:   Intake to meet >90% of estimated nutrition needs, met.  Monitor:   PO intake, labs, weight trend.  Assessment:   Patient was extubated on 2/23.  Currently consuming 75-100% of meals.  Patient is morbidly obese with BMI=49.  No supplements needed at this time.    Height: Ht Readings from Last 1 Encounters:  06/09/12 5\' 3"  (1.6 m)    Weight Status:   Wt Readings from Last 1 Encounters:  06/15/12 276 lb 10.8 oz (125.5 kg)  06/09/12  272 lb 14.9 oz (123.8 kg)   Body mass index is 49.02 kg/(m^2).   Re-estimated needs:  Kcal: 1800-2000 Protein: 85-100 gm Fluid: 2 L  Skin: no problems  Diet Order: Cardiac   Intake/Output Summary (Last 24 hours) at 06/15/12 1432 Last data filed at 06/15/12 1100  Gross per 24 hour  Intake   1135 ml  Output   5255 ml  Net  -4120 ml    Last BM: 2/25   Labs:   Recent Labs Lab 06/13/12 0400 06/14/12 0300 06/15/12 0445  NA 140 140 138  K 4.4 5.4* 4.6  CL 107 107 102  CO2 25 29 31   BUN 23 20 18   CREATININE 1.06 1.13* 1.10  CALCIUM 8.8 9.5 9.5  MG 1.6 1.7 1.5  PHOS 3.5 3.0 3.7  GLUCOSE 239* 160* 247*    CBG (last 3)   Recent Labs  06/14/12 1959 06/15/12 0802 06/15/12 1201  GLUCAP 114* 196* 291*    Scheduled Meds: . albuterol  2.5 mg Nebulization Q6H  . antiseptic oral rinse  15 mL Mouth Rinse q12n4p  . chlorhexidine  15 mL Mouth Rinse BID  . Chlorhexidine Gluconate Cloth  6 each Topical Q0600  . furosemide  40 mg Oral BID  . hydrALAZINE  25 mg Oral Q8H  . insulin aspart  0-15 Units Subcutaneous TID WC  . insulin aspart  0-5 Units Subcutaneous QHS  . insulin glargine  20 Units Subcutaneous QHS  . metFORMIN  850 mg Oral BID WC  . morphine  5 mg Oral Q6H  . multivitamin with minerals  1  tablet Oral Daily  . mupirocin cream   Topical BID  . pregabalin  200 mg Oral BID  . sodium chloride  3 mL Intravenous Q12H  . spironolactone  50 mg Oral Daily    Continuous Infusions: . sodium chloride 20 mL/hr at 06/14/12 1900  . heparin 1,300 Units/hr (06/14/12 2000)    Joaquin Courts, RD, LDN, CNSC Pager# [phone] After Hours Pager# [phone]

## 2012-06-15 NOTE — Progress Notes (Signed)
ANTICOAGULATION CONSULT NOTE  Pharmacy Consult for Heparin Indication: Hx DVT  No Known Allergies  Patient Measurements: Height: 5\' 3"  (160 cm) Weight: 276 lb 10.8 oz (125.5 kg) IBW/kg (Calculated) : 52.4 Heparin Dosing Weight: 89 kg  Vital Signs: Temp: 98 F (36.7 C) (02/27 0800) Temp src: Axillary (02/27 0408) BP: 137/64 mmHg (02/27 0900) Pulse Rate: 104 (02/27 0900)  Labs:  Recent Labs  06/13/12 0400  06/13/12 1947 06/14/12 0300 06/15/12 0445  HGB 7.2*  --   --  7.6* 7.7*  HCT 26.1*  --   --  27.3* 27.7*  PLT 306  --   --  301 254  HEPARINUNFRC 0.21*  < > 0.22* 0.32 0.38  CREATININE 1.06  --   --  1.13* 1.10  < > = values in this interval not displayed.  Estimated Creatinine Clearance: 66.6 ml/min (by C-G formula based on Cr of 1.1).  Assessment: 67 y.o. female with recent DVT, s/p recent chest tube (2/7-2/17 for pleural effusion), as well as previous bleeding from CT site. Heparin was held yesterday for thoracentesis and was resumed last PM. Level therapeutic this AM.   Goal of Therapy:  Heparin level 0.3-0.7 units/ml Monitor platelets by anticoagulation protocol: Yes   Plan:  - Cont heparin at 1300 units/hr - Continue daily level and CBC

## 2012-06-15 NOTE — Progress Notes (Signed)
Followed up with Avie Arenas RN CM regarding patient disposition.  According to RN CM patient is considering hospice services at this time but may change her mind.  Informed RN CM that [facility] SW services may be a viable option in the absence of Hospice to help guide plan of care decisions at home.  RNCM agreed.  Will await her physician disposition to determine further engagement. Of note, [facility] Care Management services does not replace or interfere with any services that are arranged by inpatient case management or social work.  For additional questions or referrals please contact Anibal Henderson BSN RN [facility] Liaison at [phone].

## 2012-06-15 NOTE — Progress Notes (Signed)
Placed pt. On bipap as per order. Pt. Tolerating well at this time. RT to monitor.

## 2012-06-16 ENCOUNTER — Inpatient Hospital Stay (HOSPITAL_COMMUNITY): Payer: Medicare Other

## 2012-06-16 LAB — CBC WITH DIFFERENTIAL/PLATELET
Basophils Absolute: 0 10*3/uL (ref 0.0–0.1)
Basophils Relative: 0 % (ref 0–1)
Eosinophils Relative: 2 % (ref 0–5)
HCT: 29.3 % — ABNORMAL LOW (ref 36.0–46.0)
Hemoglobin: 8.1 g/dL — ABNORMAL LOW (ref 12.0–15.0)
Lymphocytes Relative: 20 % (ref 12–46)
Lymphs Abs: 1.8 10*3/uL (ref 0.7–4.0)
MCV: 70.8 fL — ABNORMAL LOW (ref 78.0–100.0)
Monocytes Relative: 10 % (ref 3–12)
Neutro Abs: 6.3 10*3/uL (ref 1.7–7.7)
RBC: 4.14 MIL/uL (ref 3.87–5.11)
RDW: 24.2 % — ABNORMAL HIGH (ref 11.5–15.5)
WBC: 9.2 10*3/uL (ref 4.0–10.5)

## 2012-06-16 LAB — BASIC METABOLIC PANEL
CO2: 34 mEq/L — ABNORMAL HIGH (ref 19–32)
Calcium: 9.4 mg/dL (ref 8.4–10.5)
Chloride: 100 mEq/L (ref 96–112)
Glucose, Bld: 170 mg/dL — ABNORMAL HIGH (ref 70–99)
Potassium: 4.6 mEq/L (ref 3.5–5.1)
Sodium: 139 mEq/L (ref 135–145)

## 2012-06-16 LAB — GLUCOSE, CAPILLARY
Glucose-Capillary: 237 mg/dL — ABNORMAL HIGH (ref 70–99)
Glucose-Capillary: 211 mg/dL — ABNORMAL HIGH (ref 70–99)

## 2012-06-16 LAB — HEPARIN LEVEL (UNFRACTIONATED): Heparin Unfractionated: 0.36 IU/mL (ref 0.30–0.70)

## 2012-06-16 LAB — MAGNESIUM: Magnesium: 1.3 mg/dL — ABNORMAL LOW (ref 1.5–2.5)

## 2012-06-16 MED ORDER — MAGNESIUM SULFATE 40 MG/ML IJ SOLN
2.0000 g | Freq: Once | INTRAMUSCULAR | Status: AC
  Administered 2012-06-16: 2 g via INTRAVENOUS
  Filled 2012-06-16 (×2): qty 50

## 2012-06-16 MED ORDER — MAGNESIUM SULFATE 40 MG/ML IJ SOLN
2.0000 g | Freq: Once | INTRAMUSCULAR | Status: AC
  Administered 2012-06-16: 2 g via INTRAVENOUS

## 2012-06-16 MED ORDER — MAGNESIUM SULFATE 50 % IJ SOLN: 2.0000 g | Freq: Once | INTRAVENOUS | Status: DC

## 2012-06-16 NOTE — Progress Notes (Signed)
ANTICOAGULATION CONSULT NOTE - Follow Up Consult  Pharmacy Consult for Heparin Indication: Hx recent RLE DVT (05/26/12)  No Known Allergies  Patient Measurements: Height: 5\' 3"  (160 cm) Weight: 276 lb 10.8 oz (125.5 kg) IBW/kg (Calculated) : 52.4 Heparin Dosing Weight: 83.5 kg  Vital Signs: Temp: 99.6 F (37.6 C) (02/28 0800) Temp src: Oral (02/28 0800) BP: 153/66 mmHg (02/28 1200) Pulse Rate: 110 (02/28 1200)  Labs:  Recent Labs  06/14/12 0300 06/15/12 0445 06/16/12 0500  HGB 7.6* 7.7* 8.1*  HCT 27.3* 27.7* 29.3*  PLT 301 254 265  HEPARINUNFRC 0.32 0.38 0.36  CREATININE 1.13* 1.10 1.09    Estimated Creatinine Clearance: 67.2 ml/min (by C-G formula based on Cr of 1.09).   Medications:  Heparin @ 1300 units/hr (13 ml/hr)  Assessment: 67 y.o. F on heparin for recent RLE DVT (Feb '14) with a therapeutic heparin level this morning (HL 0.36, goal of 0.3-0.7). The patient is s/p chest tube for pleural effusions from 2/7-2/17 and repeat thoracentesis on 2/26 with ~370 cc of fluid removed. Hgb/Hct/Plt stable. No overt s/sx of bleeding noted. Will f/u plans to transition back to lovenox once stable.   Goal of Therapy:  Heparin level 0.3-0.7 units/ml Monitor platelets by anticoagulation protocol: Yes   Plan:  1. Continue heparin at current rate of 1300 units/hr (13 ml/hr) 2. Will continue to monitor for any signs/symptoms of bleeding and will follow up with heparin level in the a.m.   Georgina Pillion, PharmD, BCPS Clinical Pharmacist Pager: [phone] 06/16/2012 1:57 PM

## 2012-06-16 NOTE — Progress Notes (Signed)
PULMONARY  / CRITICAL CARE MEDICINE  Name: Melissa Villa MRN: [id number] DOB: 12-21-1945    ADMISSION DATE:  06/09/2012 CONSULTATION DATE:  06/09/12   REFERRING MD :  IM TS  CHIEF COMPLAINT:  R Effusion, Resp Failure  BRIEF PATIENT DESCRIPTION: 67 y/o F with PMH of invasive ductal breast cancer, carcinoid of RLL, diastolic heart failure, chronic obstructive pulmonary disease [08/05/2010 PFT showed FEV1 of 2 L/72% with a ratio of 79, total lung capacity 68% and a DLCO 49%], diabetes, and complete heart block requiring dual-chamber pacemaker who presented to [name] on 2/21 after multiple falls at home, increasing shortness of breath. Found to have worsening of R pleural effusion.  Placed on BiPAP for support and intubated.    LINES / TUBES: Portacath >> Chest tube 2/13>>>out pre dc 2/17;/14 2/22 ett>>> 06/11/2012    CULTURES: Urine 2/22>>> negative MRSa pos BC 2/22>>> no growth as 06/13/2012 cdiff 2/23>>> NEG   Recent Labs Lab 06/09/12 1732  LATICACIDVEN 1.0  No results found for this basename: PROCALCITON,  in the last 168 hours    ANTIBIOTICS: Vanco 2/21>>>2/21 Zosyn 2/21>>>2/21 Flagyl (hx of C-DifF)2/21 >>> 06/12/2012 [C. difficile negative]     SIGNIFICANT EVENTS / STUDIES:  2/5-2/17 - admit for acute on chronic resp. Failure possible HCAP, right leg DVT, C.diff, AKI, and CT guided drainage of pleural effusion.   Doppler legs 05/26/12 - Findings consistent with acute deep vein thrombosis involving the right popliteal vein and right posterior tibial veins, and deep vein thrombosis of indeterminate age involving the right femoral vein.  Pleural fluid 05/31/12 - CYTOLOGY NEG FOR MALIGN CELLS but POSITIVE FOR INFLMN 2/21 - Admit with resp failure, worsening effusion, bleeding from chest tube site 2/21 head CT - neg acute 2/21- neck CT- neg acute fx, Mixed lytic sclerotic appearance of the C6 vertebral body.This is highly suspicious for metastatic disease.  Further evaluation with MRI is recommended. 2/22 renal US - neg hydro 2/23- redcution in effusion on pcxr 06/12/2012: Extubated yesterday but complaining of increasing respiratory distress today. We will start BiPAP 06/13/2012: Continues to have mild to moderate respiratory distress and dyspnea. CT investigations are revealing multiple metastasis including liver, potential lung, and spinous. Patient keen on imatinib outpatient therapy 06/14/2012: Status post thoracentesis;: Atypical cells on cytology, 370 mL removed at bedside by IR   SUBJECTIVE/OVERNIGHT/INTERVAL HX 06/16/2012 : Off NPPV. No distress. Cognition intact. No new complaints. Goals of care discussions with primary care physician and primary oncologist in progress. Patient desirous of continued medical support except for CPR and keen on imatinib therapy for breast cancer though her overall prognosis from breast cancer is  very poor according to oncology  VITAL SIGNS: Temp:  [97.6 F (36.4 C)-99.6 F (37.6 C)] 99.6 F (37.6 C) (02/28 0800) Pulse Rate:  [106-118] 107 (02/28 1400) Resp:  [20-32] 27 (02/28 1400) BP: (129-172)/(46-93) 153/66 mmHg (02/28 1200) SpO2:  [92 %-100 %] 100 % (02/28 1426) FiO2 (%):  [40 %] 40 % (02/28 0600)  PHYSICAL EXAMINATION: General:  Morbidly obese in NAd Neuro:  rass 0 CAM-ICU negative for delirium HEENT: ett Cardiovascular:  s1s2 rrr, no m/r/g Lungs: No wheeze, diminished BS on R Abdomen:  Obese/soft, bsx4 active Musculoskeletal:  No acute deformities Skin:  Warm/dry, R breast with old small incision site from previous chest tube oozing blood, changes c/w radiation   Recent Labs Lab 06/14/12 0300 06/15/12 0445 06/16/12 0500  NA 140 138 139  K 5.4* 4.6 4.6  CL 107 102 100  CO2 29 31 34*  BUN 20 18 16   CREATININE 1.13* 1.10 1.09  GLUCOSE 160* 247* 170*    Recent Labs Lab 06/14/12 0300 06/15/12 0445 06/16/12 0500  HGB 7.6* 7.7* 8.1*  HCT 27.3* 27.7* 29.3*  WBC 8.5 8.8 9.2   PLT 301 254 265   Dg Chest Port 1 View  06/16/2012  *RADIOLOGY REPORT*  Clinical Data: Respiratory failure, recent right thoracentesis  PORTABLE CHEST - 1 VIEW  Comparison: Portable chest x-ray of 06/14/2012  Findings: There is little change in aeration.  Airspace disease bilaterally right greater than left is stable and there may be a slight increase in volume right pleural effusion.  No pneumothorax is noted.  Mild cardiomegaly stable.  A permanent pacemaker and power port Port-A-Cath remain.  IMPRESSION: Little change in aeration and airspace disease, with possible slight increase in volume of the right pleural effusion.   Original Report Authenticated By: Dwyane Dee, M.D.    Dg Chest Port 1 View  06/14/2012  *RADIOLOGY REPORT*  Clinical Data: Status post thoracentesis.  PORTABLE CHEST - 1 VIEW  Comparison: Chest x-ray 06/12/2012.  Findings: The right lateral pleural effusion is decreased. Persistent airspace disease is noted.  A smaller right pleural effusion is present.  Pacing wires and left IJ Port-A-Cath are stable.  Moderate pulmonary vascular congestion is unchanged.  Mild basilar atelectasis at the left base is improved.  IMPRESSION:  1.  Status post right thoracentesis without evidence for pneumothorax. 2.  Decreased right pleural effusion. 3.  Persistent small right pleural effusion and lower lobe airspace disease.  While this may represent atelectasis, infection is not excluded. 4.  Similar pattern of mild pulmonary vascular congestion.   Original Report Authenticated By: Marin Roberts, M.D.    US Thoracentesis Asp Pleural Space W/img Guide  06/16/2012  *RADIOLOGY REPORT*  Clinical data: Breast carcinoma with loculated pleural effusion post chest tube removal, respiratory distress.  THORACENTESIS WITH ULTRASOUND  Technique:  Survey ultrasound of the right hemithorax was performed and an appropriate skin entry site was localized.  Site was marked, prepped with Betadine, draped in usual  sterile fashion, infiltrated locally with 1% lidocaine. The 5-French Yueh sheath needle was advanced into the pleural space.  Clear amber fluid returned.  370 ml was removed. Postprocedure imaging shows decompression of the the visualized collection. The patient tolerated procedure well, with no immediate complication.  IMPRESSION  Technically successful ultrasound-guided right thoracentesis. Follow-up chest radiograph pending.   Original Report Authenticated By: D. Andria Rhein, MD     Principal Problem:   Respiratory distress Active Problems:   DIABETES MELLITUS, TYPE II   OBSTRUCTIVE SLEEP APNEA   HYPERTENSION   CONGESTIVE HEART FAILURE   COPD   Pleural effusion, right   HCAP (healthcare-associated pneumonia)   Right leg DVT   Altered mental status    ASSESSMENT / PLAN:   Acute Respiratory Failure - predominantly due to recurrent large R effusion O2 Dependence H/O OSA Recurrent R Pleural Effusion - repeat thoracentesis 2/26 Pulmonary HTN - PA on ECHO 104   06/16/2012: Stable from respiratory standpoint but still using overnight BiPAP  Plan: - Stable for transfer to SDU - order placed - Cont intermittent NPPV with mandatory nocturnal support - If pleural effusion recurs or worsens, will consider PLeurX cath His respiratory distress worsens consider CT angiogram for pulmonary embolism  HTN Chronic Diastolic CHF - ECHO 05/25/12 with nml systolic fxn, EF 16-10%, PA peak 104  Recent Labs Lab 06/09/12 1732 06/12/12 1100  06/13/12 0400 06/14/12 0300  PROBNP 1443.0* 637.0* 661.0* 710.1*    No results found for this basename: TROPONINI,  in the last 168 hours   Plan: - Change Lasix to PO - Add daily spironolactone  Acute Renal Failure  Recent Labs Lab 06/12/12 0430 06/13/12 0400 06/14/12 0300 06/15/12 0445 06/16/12 0500  NA 142 140 140 138 139  K 3.7 4.4 5.4* 4.6 4.6  CL 110 107 107 102 100  CO2 26 25 29 31  34*  GLUCOSE 150* 239* 160* 247* 170*  BUN 30* 23 20  18 16   CREATININE 1.06 1.06 1.13* 1.10 1.09  CALCIUM 8.8 8.8 9.5 9.5 9.4  MG  --  1.6 1.7 1.5 1.3*  PHOS  --  3.5 3.0 3.7  --     ARF resolving Admit wth 2.13 in setting of metformin / ARB. On 06/08/2012 mild hypomagnesemia present   Plan: Replace magnesium -Monitor   Diabetes Mellitus -   Resume metformin and reduced dose of Lantus Cont SSI   Hematology and oncology  Recent Labs Lab 06/14/12 0300 06/15/12 0445 06/16/12 0500  HGB 7.6* 7.7* 8.1*  HCT 27.3* 27.7* 29.3*  WBC 8.5 8.8 9.2  PLT 301 254 265     Hx of Invasive Ductal Breast Cancer - followed by Dr. [name].  Previously was on Herceptin but was d/c'd out of concern for CHF.  Now on Fulvestrant. 06/09/2012 : CT scan shows possible C6 vertebral metastasis, liver met, and ? lymphangitis Carcinoid RLL . Right thoracentesis with atypical cells concerning but not diagnostic of malignancy 06/14/2012 R DVT - (05/2012) on full dose lovenox Anemia - no significant drop in Hgb   - 06/13/2012: Evidence of progression of her breast cancer to stage IV.  - 06/14/12 and 06/16/2012 - Dr [name] recommend hospice but patient declined. She is keen on trying imatinib   Plan: - heparin drip maintain for DVT  (at some point we'll switch to Lovenox] - - Pack red cells for hemoglobin less than 7 g percent   Neurologic Acute Encephalopathy - in setting of respiratory distress, acute renal failure Falls - multiple falls in recent months.  Generalized decline.    Plan: -fall precautions  Infectious diseases  Recent Labs Lab 06/09/12 1732  LATICACIDVEN 1.0    Hx of Recent C-Diff but  06/11/2012 C. difficile negative Rule out Pulmonary Infection - low clinical suspicion for infectious pulmonary process.    - Off antibiotics as a 06/12/2012  Plan: -Check BAL if intubated    Global 06/16/2012: Move to step down unit on the internal medicine teaching service. Pulmonary critical care medicine will sign off. Goals of  care and disposition status communication in progress between oncology and primary teaching service  -  Dr. Kalman Shan, M.D., [facility].C.P Pulmonary and Critical Care Medicine Staff Physician [facility] [place] [facility] Pager: [phone], If no answer or between  15:00h - 7:00h: call [phone]  06/16/2012 3:10 PM

## 2012-06-16 NOTE — Progress Notes (Addendum)
Transfer Note   Subjective: Patient denies complaints   Objective: Vital signs in last 24 hours: Filed Vitals:   06/16/12 1800 06/16/12 1900 06/16/12 1923 06/16/12 1924  BP:      Pulse: 112 110  110  Temp:      TempSrc:      Resp: 25 26  25   Height:      Weight:      SpO2: 97% 96% 97% 97%   Weight change:   Intake/Output Summary (Last 24 hours) at 06/16/12 2004 Last data filed at 06/16/12 1900  Gross per 24 hour  Intake    806 ml  Output   2340 ml  Net  -1534 ml   General: lying in bed, nad, alert and oriented to person, place, month, year HEENT: [place]/at CV: tachycardic, no murmurs Lungs: course rhonchi and wheezing b/l  Abdomen: obese, soft, ntnd, +bs  Extremities: warm, 1-2+ pitting edema b/l, no cyanosis, in Prevalon boots  Neuro: CN 2-12 grossly intact, moving all 4 extremities Psych: flat affect  Lab Results: Basic Metabolic Panel:  Recent Labs Lab 06/14/12 0300 06/15/12 0445 06/16/12 0500  NA 140 138 139  K 5.4* 4.6 4.6  CL 107 102 100  CO2 29 31 34*  GLUCOSE 160* 247* 170*  BUN 20 18 16   CREATININE 1.13* 1.10 1.09  CALCIUM 9.5 9.5 9.4  MG 1.7 1.5 1.3*  PHOS 3.0 3.7  --    Liver Function Tests:  Recent Labs Lab 06/10/12 0630  AST 17  ALT 10  ALKPHOS 41  BILITOT 0.1*  PROT 6.7  ALBUMIN 2.2*   CBC:  Recent Labs Lab 06/15/12 0445 06/16/12 0500  WBC 8.8 9.2  NEUTROABS 6.2 6.3  HGB 7.7* 8.1*  HCT 27.7* 29.3*  MCV 71.0* 70.8*  PLT 254 265   BNP:  Recent Labs Lab 06/12/12 1100 06/13/12 0400 06/14/12 0300  PROBNP 637.0* 661.0* 710.1*   CBG:  Recent Labs Lab 06/15/12 1201 06/15/12 1620 06/15/12 2108 06/16/12 0807 06/16/12 1133 06/16/12 1634  GLUCAP 291* 338* 178* 163* 237* 211*   Coagulation:  Recent Labs Lab 06/10/12 0630  LABPROT 15.6*  INR 1.27     Micro Results: Recent Results (from the past 240 hour(s))  URINE CULTURE     Status: None   Collection Time    06/09/12  1:18 PM      Result Value Range Status    Specimen Description URINE, CATHETERIZED   Final   Special Requests NONE   Final   Culture  Setup Time 06/09/2012 14:58   Final   Colony Count NO GROWTH   Final   Culture NO GROWTH   Final   Report Status 06/10/2012 FINAL   Final  CULTURE, BLOOD (ROUTINE X 2)     Status: None   Collection Time    06/09/12  4:38 PM      Result Value Range Status   Specimen Description BLOOD HAND LEFT   Final   Special Requests BOTTLES DRAWN AEROBIC AND ANAEROBIC 10CC   Final   Culture  Setup Time 06/09/2012 22:52   Final   Culture NO GROWTH 5 DAYS   Final   Report Status 06/15/2012 FINAL   Final  CULTURE, BLOOD (ROUTINE X 2)     Status: None   Collection Time    06/09/12  4:47 PM      Result Value Range Status   Specimen Description BLOOD HAND LEFT   Final   Special Requests BOTTLES DRAWN  AEROBIC ONLY 1CC   Final   Culture  Setup Time 06/09/2012 22:52   Final   Culture NO GROWTH 5 DAYS   Final   Report Status 06/15/2012 FINAL   Final  MRSA PCR SCREENING     Status: Abnormal   Collection Time    06/09/12  9:20 PM      Result Value Range Status   MRSA by PCR POSITIVE (*) NEGATIVE Final   Comment:            The GeneXpert MRSA Assay (FDA     approved for NASAL specimens     only), is one component of a     comprehensive MRSA colonization     surveillance program. It is not     intended to diagnose MRSA     infection nor to guide or     monitor treatment for     MRSA infections.     RESULT CALLED TO, READ BACK BY AND VERIFIED WITHSalena Saner [facility] RN [id number] 06/09/12 A BROWNING  URINE CULTURE     Status: None   Collection Time    06/10/12  5:20 AM      Result Value Range Status   Specimen Description URINE, CATHETERIZED   Final   Special Requests Immunocompromised   Final   Culture  Setup Time 06/10/2012 12:38   Final   Colony Count NO GROWTH   Final   Culture NO GROWTH   Final   Report Status 06/11/2012 FINAL   Final  CLOSTRIDIUM DIFFICILE BY PCR     Status: None   Collection Time     06/11/12  6:24 AM      Result Value Range Status   C difficile by pcr NEGATIVE  NEGATIVE Final   Studies/Results: Dg Chest Port 1 View  06/16/2012  *RADIOLOGY REPORT*  Clinical Data: Respiratory failure, recent right thoracentesis  PORTABLE CHEST - 1 VIEW  Comparison: Portable chest x-ray of 06/14/2012  Findings: There is little change in aeration.  Airspace disease bilaterally right greater than left is stable and there may be a slight increase in volume right pleural effusion.  No pneumothorax is noted.  Mild cardiomegaly stable.  A permanent pacemaker and power port Port-A-Cath remain.  IMPRESSION: Little change in aeration and airspace disease, with possible slight increase in volume of the right pleural effusion.   Original Report Authenticated By: Dwyane Dee, M.D.    Medications:  Scheduled Meds: . albuterol  2.5 mg Nebulization Q6H  . antiseptic oral rinse  15 mL Mouth Rinse q12n4p  . chlorhexidine  15 mL Mouth Rinse BID  . Chlorhexidine Gluconate Cloth  6 each Topical Q0600  . furosemide  40 mg Oral BID  . hydrALAZINE  25 mg Oral Q8H  . insulin aspart  0-15 Units Subcutaneous TID WC  . insulin aspart  0-5 Units Subcutaneous QHS  . insulin glargine  20 Units Subcutaneous QHS  . metFORMIN  850 mg Oral BID WC  . morphine  5 mg Oral Q6H  . multivitamin with minerals  1 tablet Oral Daily  . mupirocin cream   Topical BID  . pregabalin  200 mg Oral BID  . sodium chloride  3 mL Intravenous Q12H  . spironolactone  50 mg Oral Daily   Continuous Infusions: . sodium chloride 20 mL/hr at 06/14/12 1900  . heparin 1,300 Units/hr (06/16/12 1200)   PRN Meds:.acetaminophen, albuterol, fentaNYL, ondansetron (ZOFRAN) IV, traMADol  Assessment/Plan: 67 y.o metastatic  right invasive breast cancer, carcinoid of right lower lung, decline in health since 02/2012 admitted 2/21 with acute encephalopathy (resolved),  post multiple falls at home, acute on chronic respiratory failure (mulifactorial)  likely due to recurrent pleural effusion with failure of bipap on admission status post extubation 2/23, and resolved AKI.    1. Metastatic cancer (primary breast cancer, RLL carcinoid)  -now with lytic bone lesions (C6) on Xray and CT chest with suggestions of lymphangitic spread of malignancy and left lobe liver lesion 3.4 x 2.4 cm which could represent metastasis.   -Followed by Dr. [name] -Previously was on Herceptin but was d/c'd out of concern for CHF. Now on Fulvestrant -At this time patient does not want hospice only a medication to prolong life  -will need to consult Palliative care as cancer and decline in health seems to be worsening -patient does not want hospice  2. Right lower extremity DVT -diagnosed 2/7 -on Heparin gtt   3. Acute on chronic respiratory failure (likely multifactorial but acute due to recent recurrent pleural effusion) -Failed bipap on admission then intubated 2/22-2/23  -respiratory status improving now on [place] -recurrent pleural effusion-CTS consulted (Dr. Tyrone Sage) for VATS or pleurex (will consider pleurex cath if pleural effusion reaccumulates); status post thoracentesis 2/26 with few atypical cells  -History of COPD on 2L at home; continue bronchodilators -History of OSA -initially concerned for HCAP given Vancomycin and Zosyn 2/21 x 1 days  -bipap  4. Chronic diastolic heart failure  -Lasix 40 bid -Strict i/o, daily weights   5. Diabetes history  -SSI, Lantus 20 units qhs, Metformin 850 mg bid  -monitor cbg  6. HTN history -Hydralazine 25 mg q8, Lasix 40 bid, Spironolactone 50 mg qd  -monitor BP  7. MRSA + nares  -MRSA protocol   8. Normocytic Anemia (borderline microcytic) -hemoglobin 8.1  -trend cbc   9. F/E/N -monitor electrolytes (Magnesium 1.3-repleted today)  -heart healthy diet  Code status: Acceptable to intubate. No CPR   Dispo: Disposition is deferred at this time, awaiting improvement of current medical problems.   Anticipated discharge in approximately 2-3 day(s). Patient will possibly need SNF  The patient does have a current PCP [name], MD), therefore will be requiring OPC follow-up after discharge.   The patient does not have transportation limitations that hinder transportation to clinic appointments.  .Services Needed at time of discharge: Y = Yes, Blank = No PT: Y  OT:   RN: Y  Equipment:   Other: Social work     LOS: 7 days   Annett Gula [phone] 06/16/2012, 8:04 PM

## 2012-06-16 NOTE — Progress Notes (Signed)
Primary Care Physician Note Date: 06/16/2012  Patient name: Melissa Villa Medical record number: [id number] Date of birth: 1946-03-16 Age: 67 y.o. Gender: female  I talked with patient and have discussed her care with Dr. Marchelle Gearing, Dr. [name], and hospice physician Dr. Jamie Brookes.  Patient was admitted with respiratory failure, and has chronic lung disease with recurrent episodes of respiratory failure over the past few months, recurrent right pleural effusion, breast cancer status as outlined by Dr. [name] in his note, and other comorbidities as documented in the chart.  Based on my discussion with her yesterday evening and this morning, she desires active treatment and would want reintubation and ventilatory support if needed.  Per Dr. [name], she does not qualify for hospice based on her cancer status.  Based upon discussion with Dr. Marchelle Gearing and Dr. Jamie Brookes, she may qualify based upon the severity and progression of her chronic lung disease, but patient has not made a decision in favor of hospice care at this time.  I am concerned about her inability to care for herself at home and her need for 24-hour assistance/supervision, [name] think a skilled nursing facility level of care would be appropriate but patient has so far not agreed to SNF placement.  For now, she needs additional inpatient care including mobilization out of bed if possible by physical therapy and assessment of her functional status, as well as education of her family about her care needs if she does go home in a few days.

## 2012-06-16 NOTE — Evaluation (Signed)
Occupational Therapy Evaluation Patient Details Name: VIVIENE THURSTON MRN: [id number] DOB: Oct 17, 1945 Today's Date: 06/16/2012 Time: 4098-1191 OT Time Calculation (min): 27 min  OT Assessment / Plan / Recommendation Clinical Impression  This 67 yo female admitted with AMS and R pleural effusion presents to acute OT with problems below. Will benefit from acute OT with follow up OT at SNF.    OT Assessment  Patient needs continued OT Services    Follow Up Recommendations  SNF    Barriers to Discharge Decreased caregiver support    Equipment Recommendations  None recommended by OT       Frequency  Min 2X/week    Precautions / Restrictions Precautions Precautions: Fall Restrictions Weight Bearing Restrictions: No       ADL  Eating/Feeding: Simulated;Independent Where Assessed - Eating/Feeding: Chair Grooming: Simulated;Set up Where Assessed - Grooming: Supported sitting Upper Body Bathing: Simulated;Set up Where Assessed - Upper Body Bathing: Supported sitting Lower Body Bathing: Simulated;Maximal assistance Where Assessed - Lower Body Bathing: Supported sit to stand (2 people sit to stand) Upper Body Dressing: Simulated;Minimal assistance Where Assessed - Upper Body Dressing: Supported sitting Lower Body Dressing: Simulated;+1 Total assistance Where Assessed - Lower Body Dressing: Supported sit to stand (2 people sit to stand) Toilet Transfer: Chief of Staff: Patient Percentage: 30% Statistician Method: Surveyor, minerals:  (Bed (raised) to recliner going to pt's left) Toileting - Architect and Hygiene: Simulated;+2 Total assistance Toileting - Clothing Manipulation and Hygiene: Patient Percentage: 0% Where Assessed - Toileting Clothing Manipulation and Hygiene: Standing Transfers/Ambulation Related to ADLs: total A+2 (pt=30%) for all    OT Diagnosis: Generalized weakness  OT Problem List: Decreased  strength;Decreased activity tolerance;Impaired balance (sitting and/or standing);Obesity;Impaired UE functional use;Decreased knowledge of use of DME or AE OT Treatment Interventions: Self-care/ADL training;DME and/or AE instruction;Therapeutic activities;Patient/family education;Balance training   OT Goals Acute Rehab OT Goals OT Goal Formulation: With patient Time For Goal Achievement: 06/30/12 Potential to Achieve Goals: Good ADL Goals Pt Will Transfer to Toilet: with mod assist;Ambulation;with DME;3-in-1;Comfort height toilet;Grab bars ADL Goal: Toilet Transfer - Progress: Goal set today Arm Goals Pt Will Complete Theraband Exer: with supervision, verbal cues required/provided;1 set;10 reps;Level 1 Theraband;Bilateral upper extremities;to increase strength Arm Goal: Theraband Exercises - Progress: Goal set today Pt Will Complete Theraputty Exer: with supervision, verbal cues required/provided;Bilateral upper extremities;Mod resistance putty;to increase strength Arm Goal: Theraputty Exercises - Progress: Goal set today Miscellaneous OT Goals Miscellaneous OT Goal #1: Pt will be Min A up to EOB and back into bed from sitting with MinA for BADLs OT Goal: Miscellaneous Goal #1 - Progress: Goal set today  Visit Information  Last OT Received On: 06/16/12 Assistance Needed: +2 PT/OT Co-Evaluation/Treatment: Yes       Prior Functioning     Home Living Lives With: Alone Available Help at Discharge: Family;Available PRN/intermittently;Other (Comment) Type of Home: House Home Access: Stairs to enter Entrance Stairs-Number of Steps: 5 Entrance Stairs-Rails: Right Home Layout: One level Bathroom Shower/Tub: Health visitor: Standard Home Adaptive Equipment: Bedside commode/3-in-1;Shower chair with back;Wheelchair - [employer] - four wheeled Prior Function Level of Independence: Needs assistance Needs Assistance: Meal Prep;Light Housekeeping Meal Prep: Total Light  Housekeeping: Total Able to Take Stairs?: Yes Driving: No Vocation: Retired Musician: No difficulties Dominant Hand: Right         Vision/Perception Vision - History Baseline Vision: Wears glasses only for reading Patient Visual Report: No change from baseline   Cognition  Cognition Overall Cognitive Status: Appears within functional limits for tasks assessed/performed Arousal/Alertness: Awake/alert Orientation Level: Appears intact for tasks assessed Behavior During Session: [facility] for tasks performed    Extremity/Trunk Assessment Right Upper Extremity Assessment RUE ROM/Strength/Tone: Deficits (3/5 with weak triceps and grip strength) RUE Coordination: WFL - gross/fine motor Left Upper Extremity Assessment LUE ROM/Strength/Tone: Deficits (3/5 with weak triceps and grip strength) LUE Coordination: WFL - gross/fine motor Right Lower Extremity Assessment RLE ROM/Strength/Tone: Deficits RLE ROM/Strength/Tone Deficits: hip flexors2+ to 3-/5, quads >3/5, hams 3/5,df/pf 2+5 Left Lower Extremity Assessment LLE ROM/Strength/Tone: Deficits LLE ROM/Strength/Tone Deficits: hip 2+5, quad 3/5, hams 3-/5, pf-df 2+/5     Mobility Bed Mobility Bed Mobility: Supine to Sit;Sitting - Scoot to Edge of Bed Supine to Sit: 1: +2 Total assist;HOB elevated Supine to Sit: Patient Percentage: 30% Sitting - Scoot to Edge of Bed: 1: +2 Total assist Sitting - Scoot to Edge of Bed: Patient Percentage: 30% Details for Bed Mobility Assistance: vc's for hand placement and best technique, sign truncal assis Transfers Sit to Stand: 1: +2 Total assist;With upper extremity assist;From bed Sit to Stand: Patient Percentage: 30% Stand to Sit: 1: +2 Total assist;With upper extremity assist;To chair/3-in-1 Stand to Sit: Patient Percentage: 30% Details for Transfer Assistance: vc/visual cues for hand placement and best technique; significant truncal assist        Balance Balance Balance  Assessed: Yes Static Sitting Balance Static Sitting - Balance Support: Feet supported;Right upper extremity supported;Left upper extremity supported Static Sitting - Level of Assistance: 4: Min assist;5: Stand by assistance Static Sitting - Comment/# of Minutes: 4 minutes prepping to transfer to chair   End of Session OT - End of Session Activity Tolerance: Patient limited by fatigue Patient left: in chair;with call bell/phone within reach Nurse Communication: Mobility status (+2 back to bed or maxi sky)       Evette Georges [phone] 06/16/2012, 2:27 PM

## 2012-06-16 NOTE — Evaluation (Signed)
Physical Therapy Evaluation Patient Details Name: Melissa Villa MRN: [id number] DOB: [birth year] Today's Date: 06/16/2012 Time: 1478-2956 PT Time Calculation (min): 35 min  PT Assessment / Plan / Recommendation Clinical Impression  pt admitted with AMS, R pleural effusion.  On eval, pt is profoundly weak with little tolerance for activity.  Pt should benefit from PT to improve strength and function.  Pt will need SNF for rehab.    PT Assessment  Patient needs continued PT services    Follow Up Recommendations  SNF    Does the patient have the potential to tolerate intense rehabilitation      Barriers to Discharge Decreased caregiver support      Equipment Recommendations  None recommended by PT;Other (comment) (TBD further)    Recommendations for Other Services     Frequency Min 3X/week    Precautions / Restrictions Precautions Precautions: Fall Restrictions Weight Bearing Restrictions: No   Pertinent Vitals/Pain Vital stable      Mobility  Bed Mobility Bed Mobility: Supine to Sit;Sitting - Scoot to Edge of Bed Supine to Sit: 1: +2 Total assist;HOB elevated Supine to Sit: Patient Percentage: 30% Sitting - Scoot to Edge of Bed: 1: +2 Total assist Sitting - Scoot to Edge of Bed: Patient Percentage: 30% Details for Bed Mobility Assistance: vc's for hand placement and best technique, sign truncal assis Transfers Transfers: Sit to Stand;Stand to Sit;Stand Pivot Transfers Sit to Stand: 1: +2 Total assist;With upper extremity assist;From bed Sit to Stand: Patient Percentage: 30% Stand to Sit: 1: +2 Total assist;With upper extremity assist;To chair/3-in-1 Stand to Sit: Patient Percentage: 30% Stand Pivot Transfers: 1: +2 Total assist;From elevated surface Stand Pivot Transfers: Patient Percentage: 30% Details for Transfer Assistance: vc/visual cues for hand placement and best technique; significant truncal assist Ambulation/Gait Ambulation/Gait Assistance: Other  (comment) (pivotal steps taken, transfers) Stairs: No Wheelchair Mobility Wheelchair Mobility: No    Exercises     PT Diagnosis: Generalized weakness;Difficulty walking  PT Problem List: Decreased strength;Decreased activity tolerance;Decreased balance;Decreased mobility;Decreased knowledge of use of DME;Cardiopulmonary status limiting activity PT Treatment Interventions: DME instruction;Gait training;Functional mobility training;Therapeutic activities;Balance training;Patient/family education   PT Goals Acute Rehab PT Goals PT Goal Formulation: With patient Time For Goal Achievement: 06/30/12 Potential to Achieve Goals: Fair Pt will go Supine/Side to Sit: with HOB 0 degrees;with mod assist PT Goal: Supine/Side to Sit - Progress: Goal set today Pt will go Sit to Stand: with mod assist;with upper extremity assist PT Goal: Sit to Stand - Progress: Goal set today Pt will Transfer Bed to Chair/Chair to Bed: with mod assist PT Transfer Goal: Bed to Chair/Chair to Bed - Progress: Goal set today Pt will Ambulate: 1 - 15 feet;with mod assist;with rolling walker;with least restrictive assistive device PT Goal: Ambulate - Progress: Goal set today Pt will Go Up / Down Stairs: Other (comment) (to be adressed on if pt went directly home) Pt will Perform Home Exercise Program: Other (comment) (general LE exer.) PT Goal: Perform Home Exercise Program - Progress: Goal set today  Visit Information  Last PT Received On: 06/16/12 Assistance Needed: +2 PT/OT Co-Evaluation/Treatment: Yes    Subjective Data  Subjective: I've been pretty much the same sine November Patient Stated Goal: Get stronger   Prior Functioning  Home Living Lives With: Alone Available Help at Discharge: Family;Available PRN/intermittently;Other (Comment) Type of Home: House Home Access: Stairs to enter Entrance Stairs-Number of Steps: 5 Entrance Stairs-Rails: Right Home Layout: One level Bathroom Shower/Tub: Architectural technologist: Standard  Home Adaptive Equipment: Bedside commode/3-in-1;Shower chair with back;Wheelchair - [employer] - four wheeled Prior Function Level of Independence: Needs assistance Needs Assistance: Meal Prep;Light Housekeeping Meal Prep: Total Light Housekeeping: Total Able to Take Stairs?: Yes Driving: No Vocation: Retired Musician: No difficulties    Copywriter, advertising Overall Cognitive Status: Appears within functional limits for tasks assessed/performed Arousal/Alertness: Awake/alert Orientation Level: Appears intact for tasks assessed Behavior During Session: [facility] for tasks performed    Extremity/Trunk Assessment Right Lower Extremity Assessment RLE ROM/Strength/Tone: Deficits RLE ROM/Strength/Tone Deficits: hip flexors2+ to 3-/5, quads >3/5, hams 3/5,df/pf 2+5 Left Lower Extremity Assessment LLE ROM/Strength/Tone: Deficits LLE ROM/Strength/Tone Deficits: hip 2+5, quad 3/5, hams 3-/5, pf-df 2+/5   Balance Balance Balance Assessed: Yes Static Sitting Balance Static Sitting - Balance Support: Feet supported;Right upper extremity supported;Left upper extremity supported Static Sitting - Level of Assistance: 4: Min assist;5: Stand by assistance Static Sitting - Comment/# of Minutes: 4 minutes prepping to transfer to chair  End of Session PT - End of Session Equipment Utilized During Treatment: Oxygen Activity Tolerance: Other (comment) (and significant weakness) Patient left: in chair;with call bell/phone within reach Nurse Communication: Mobility status  GP     Ruthvik Barnaby, Eliseo Gum 06/16/2012, 1:57 PM  06/16/2012  [place], PT [phone] (pager)

## 2012-06-16 NOTE — Clinical Social Work Psychosocial (Addendum)
    Clinical Social Work Department BRIEF PSYCHOSOCIAL ASSESSMENT 06/16/2012  Patient:  Melissa Villa, Melissa Villa     Account Number:  [id number]     Admit date:  06/09/2012  Clinical Social Worker:  Margaree Mackintosh  Date/Time:  06/16/2012 04:01 PM  Referred by:  Physician  Date Referred:  06/16/2012 Referred for  Advanced Directives   Other Referral:   Interview type:  Patient Other interview type:    PSYCHOSOCIAL DATA Living Status:  FAMILY Admitted from facility:   Level of care:   Primary support name:  Derwood Kaplan: [phone] Primary support relationship to patient:  CHILD, ADULT Degree of support available:   Unknown.    CURRENT CONCERNS Current Concerns  Other - See comment   Other Concerns:   Advance Directives.    SOCIAL WORK ASSESSMENT / Designer, jewellery recieved referral indicating pt is intersted in [place] paperwork.  CSW reviewed chart and met with pt at bedside.  This CSW familiar with pt due to previous admit.  CSW provided active listening.  Pt confirmed interest in electing her son, Thayer Ohm, as her HCPOA.   CSW reviewed HCPOA paperwork and provided opportunity for pt to address any questions and/or concerns.  CSW reviewed that to have HCPOA paperwork notarized in hospital, two witnesses will need to be present who are not hospital employees or family members; pt stated understanding.  Pt agreeable to reviewing HCPOA paperwork and notifying hospital staff should she wish to attempt to have HCPOA documents notarized in the hospital.  CSW to sign off at this time, please re consult if needed.   Assessment/plan status:  Information/Referral to [facility] Other assessment/ plan:   Information/referral to community resources:   HCPOA    PATIENT'S/FAMILY'S RESPONSE TO PLAN OF CARE: Pt appeared receptive to CSW intervention.

## 2012-06-16 NOTE — Progress Notes (Signed)
Placed pt. On bipap as per order. Pt. Requested to go on so she could rest. Pt. Is tolerating well at this time.

## 2012-06-16 NOTE — Progress Notes (Signed)
I have been asked by Dr Jamie Brookes, Dr. Meredith Pel and Dr. Marchelle Gearing if Ms Dittmer is Hospice elegible. This is a complex issue, but perhaps I can summarize it here:  The patient has breast cancer which appears to have metastasized to her bone and liver, possibly also to the Right lung via lymphangitic spread. However we do not have pathologic proof of metastatic disease.  If the patient had established metastatic disease, as opposed to locally advanced breast cancer, that would mean she has incurable breast cancer. That does not mean it is not treatable. Her cancer is very treatable and she may get a good response from a combination of antiestrogens and anti-HER-2 agents, or chemo and anti-HER-2 agents.  Treatment of the patient's breast cancer would not be palliative, because it would not be aimed at symptom control. In fact I told the patient that with or without treatment for breast cancer her quality of life is unlikely to improve--she is likely to remain largely bed bound, subject to repeated respiratory crises, probably needing reintubation again at some point, etc.  Breast cancer treatment for Ms Goyal then would be to prolong her life. She has been very consistent with me that she does want treatment and that she would want intubation again if it came to that,.  Accordingly I do not feel Ms Stacey is hospice elegible at this point. I suspect she will need SNF at discharge.   I will be out of town next week. She already has a follow-up up with [place] 3/13 at 10:15. If she is still in the hospital when I return 3/10 I will see her then.  Please let me know if I can be of further help.  Dicky Boer C

## 2012-06-17 ENCOUNTER — Inpatient Hospital Stay (HOSPITAL_COMMUNITY): Payer: Medicare Other

## 2012-06-17 DIAGNOSIS — I5032 Chronic diastolic (congestive) heart failure: Secondary | ICD-10-CM

## 2012-06-17 LAB — BASIC METABOLIC PANEL
Chloride: 99 mEq/L (ref 96–112)
GFR calc Af Amer: 57 mL/min — ABNORMAL LOW (ref >90)
GFR calc non Af Amer: 49 mL/min — ABNORMAL LOW (ref >90)
Potassium: 3.8 mEq/L (ref 3.5–5.1)
Sodium: 137 mEq/L (ref 135–145)

## 2012-06-17 LAB — CBC WITH DIFFERENTIAL/PLATELET
Basophils Absolute: 0.1 10*3/uL (ref 0.0–0.1)
Basophils Relative: 1 % (ref 0–1)
Eosinophils Absolute: 0.2 10*3/uL (ref 0.0–0.7)
HCT: 29.1 % — ABNORMAL LOW (ref 36.0–46.0)
Hemoglobin: 8.1 g/dL — ABNORMAL LOW (ref 12.0–15.0)
MCH: 19.7 pg — ABNORMAL LOW (ref 26.0–34.0)
MCHC: 27.8 g/dL — ABNORMAL LOW (ref 30.0–36.0)
Monocytes Absolute: 0.8 10*3/uL (ref 0.1–1.0)
Neutro Abs: 5.7 10*3/uL (ref 1.7–7.7)
Neutrophils Relative %: 69 % (ref 43–77)
RDW: 24 % — ABNORMAL HIGH (ref 11.5–15.5)

## 2012-06-17 LAB — GLUCOSE, CAPILLARY
Glucose-Capillary: 128 mg/dL — ABNORMAL HIGH (ref 70–99)
Glucose-Capillary: 156 mg/dL — ABNORMAL HIGH (ref 70–99)
Glucose-Capillary: 182 mg/dL — ABNORMAL HIGH (ref 70–99)
Glucose-Capillary: 155 mg/dL — ABNORMAL HIGH (ref 70–99)

## 2012-06-17 MED ORDER — MAGNESIUM SULFATE 40 MG/ML IJ SOLN: 2.0000 g | Freq: Once | INTRAMUSCULAR | Status: DC

## 2012-06-17 NOTE — Progress Notes (Signed)
Internal Medicine Teaching Service Attending Note Date: 06/17/2012  Patient name: Melissa Villa  Medical record number: [id number]  Date of birth: 01-Mar-1946    This patient has been seen and discussed with the house staff. Please see their note for complete details. I concur with their findings with the following additions/corrections:  CXR from today with increasing pleural effusion.  Will need to discuss with CTS concerning next steps: VATS vs. pleurex catheter.   Guyla Bless 06/17/2012, 2:37 PM

## 2012-06-17 NOTE — Progress Notes (Signed)
ANTICOAGULATION CONSULT NOTE - Follow Up Consult  Pharmacy Consult for Heparin Indication: Hx recent RLE DVT (05/26/12)  No Known Allergies  Patient Measurements: Height: 5\' 3"  (160 cm) Weight: 276 lb 10.8 oz (125.5 kg) IBW/kg (Calculated) : 52.4 Heparin Dosing Weight: 83.5 kg  Vital Signs: Temp: 98.9 F (37.2 C) (03/01 0757) Temp src: Oral (03/01 0757) BP: 151/61 mmHg (03/01 0800) Pulse Rate: 109 (03/01 0825)  Labs:  Recent Labs  06/15/12 0445 06/16/12 0500 06/17/12 0500 06/17/12 0720  HGB 7.7* 8.1* 8.1*  --   HCT 27.7* 29.3* 29.1*  --   PLT 254 265 235  --   HEPARINUNFRC 0.38 0.36 0.38  --   CREATININE 1.10 1.09  --  1.15*    Estimated Creatinine Clearance: 63.7 ml/min (by C-G formula based on Cr of 1.15).   Medications:  Heparin @ 1300 units/hr (13 ml/hr)  Assessment: 67 y.o. F on heparin for recent RLE DVT (Feb '14) with a therapeutic heparin level this morning (HL 0.38, goal of 0.3-0.7). The patient is s/p chest tube for pleural effusions from 2/7-2/17 and repeat thoracentesis on 2/26 with ~370 cc of fluid removed. Hgb/Hct/Plt stable. No overt s/sx of bleeding noted. Will f/u plans to transition back to lovenox once stable.   Goal of Therapy:  Heparin level 0.3-0.7 units/ml Monitor platelets by anticoagulation protocol: Yes   Plan:  1. Continue heparin at current rate of 1300 units/hr (13 ml/hr) 2. Will continue to monitor for any signs/symptoms of bleeding and will follow up with heparin level in the a.m.   Georgina Pillion, PharmD, BCPS Clinical Pharmacist Pager: [phone] 06/17/2012 11:06 AM

## 2012-06-17 NOTE — Progress Notes (Signed)
Pt taken off bipap per request after breathing treatment was done.

## 2012-06-17 NOTE — Progress Notes (Signed)
Palliative Medicine Team consult received. Will schedule a goals of care meeting at earliest possible date and time a provider is available. Please call [phone] if there are urgent symptom management needs or acute decline in her condition prior to scheduled meeting.  Anderson Malta, DO Palliative Medicine

## 2012-06-17 NOTE — Progress Notes (Addendum)
Subjective: Patient denies sob, pain today.  She is eating and tolerating diet.  She is wearing bipap at night and feels like it helps with her breathing more at night than cpap.  Code status reviewed again and she is partial code.  Son at bedside with questions as well.    Objective: Vital signs in last 24 hours: Filed Vitals:   06/17/12 0757 06/17/12 0800 06/17/12 0825 06/17/12 0836  BP:  151/61    Pulse:  107 109   Temp: 98.9 F (37.2 C)     TempSrc: Oral     Resp:  24 24   Height:      Weight:      SpO2:  97% 99% 96%   Weight change:   Intake/Output Summary (Last 24 hours) at 06/17/12 1147 Last data filed at 06/17/12 0600  Gross per 24 hour  Intake    591 ml  Output   1040 ml  Net   -449 ml   General: lying in bed, nad, alert and oriented x 3 HEENT:[place]/at  CV: tachycardic, no rubs, murmurs, gallops Lungs: course rhonchi b/l and wheezing Abdomen: obese, soft, ntnd, normal bs  Extremities: warm, no cyanosis, +edema lower extremities b/l 1-2 + Neuro: alert and oriented x 3.  Able to move all 4 extremities  Psych: flat affect  Lab Results: Basic Metabolic Panel:  Recent Labs Lab 06/15/12 0445 06/16/12 0500 06/17/12 0500 06/17/12 0720  NA 138 139  --  137  K 4.6 4.6  --  3.8  CL 102 100  --  99  CO2 31 34*  --  34*  GLUCOSE 247* 170*  --  155*  BUN 18 16  --  15  CREATININE 1.10 1.09  --  1.15*  CALCIUM 9.5 9.4  --  9.5  MG 1.5 1.3* 1.9  --   PHOS 3.7  --  4.3  --    CBC:  Recent Labs Lab 06/16/12 0500 06/17/12 0500  WBC 9.2 8.4  NEUTROABS 6.3 5.7  HGB 8.1* 8.1*  HCT 29.3* 29.1*  MCV 70.8* 70.6*  PLT 265 235   BNP:  Recent Labs Lab 06/12/12 1100 06/13/12 0400 06/14/12 0300  PROBNP 637.0* 661.0* 710.1*   CBG:  Recent Labs Lab 06/15/12 2108 06/16/12 0807 06/16/12 1133 06/16/12 1634 06/16/12 2238 06/17/12 0725  GLUCAP 178* 163* 237* 211* 128* 156*     Micro Results: Recent Results (from the past 240 hour(s))  URINE CULTURE      Status: None   Collection Time    06/09/12  1:18 PM      Result Value Range Status   Specimen Description URINE, CATHETERIZED   Final   Special Requests NONE   Final   Culture  Setup Time 06/09/2012 14:58   Final   Colony Count NO GROWTH   Final   Culture NO GROWTH   Final   Report Status 06/10/2012 FINAL   Final  CULTURE, BLOOD (ROUTINE X 2)     Status: None   Collection Time    06/09/12  4:38 PM      Result Value Range Status   Specimen Description BLOOD HAND LEFT   Final   Special Requests BOTTLES DRAWN AEROBIC AND ANAEROBIC 10CC   Final   Culture  Setup Time 06/09/2012 22:52   Final   Culture NO GROWTH 5 DAYS   Final   Report Status 06/15/2012 FINAL   Final  CULTURE, BLOOD (ROUTINE X 2)  Status: None   Collection Time    06/09/12  4:47 PM      Result Value Range Status   Specimen Description BLOOD HAND LEFT   Final   Special Requests BOTTLES DRAWN AEROBIC ONLY 1CC   Final   Culture  Setup Time 06/09/2012 22:52   Final   Culture NO GROWTH 5 DAYS   Final   Report Status 06/15/2012 FINAL   Final  MRSA PCR SCREENING     Status: Abnormal   Collection Time    06/09/12  9:20 PM      Result Value Range Status   MRSA by PCR POSITIVE (*) NEGATIVE Final   Comment:            The GeneXpert MRSA Assay (FDA     approved for NASAL specimens     only), is one component of a     comprehensive MRSA colonization     surveillance program. It is not     intended to diagnose MRSA     infection nor to guide or     monitor treatment for     MRSA infections.     RESULT CALLED TO, READ BACK BY AND VERIFIED WITHSalena Saner [facility] RN [phone] 06/09/12 A BROWNING  URINE CULTURE     Status: None   Collection Time    06/10/12  5:20 AM      Result Value Range Status   Specimen Description URINE, CATHETERIZED   Final   Special Requests Immunocompromised   Final   Culture  Setup Time 06/10/2012 12:38   Final   Colony Count NO GROWTH   Final   Culture NO GROWTH   Final   Report Status 06/11/2012  FINAL   Final  CLOSTRIDIUM DIFFICILE BY PCR     Status: None   Collection Time    06/11/12  6:24 AM      Result Value Range Status   C difficile by pcr NEGATIVE  NEGATIVE Final   Studies/Results: Dg Chest Port 1 View  06/17/2012  *RADIOLOGY REPORT*  Clinical Data: Pleural effusion  PORTABLE CHEST - 1 VIEW  Comparison: 06/16/2012; 06/14/2012; 06/12/2012; chest CT - 06/12/2012; ultrasound-guided right-sided thoracentesis - 06/14/2012  Findings:  Grossly unchanged cardiac silhouette and mediastinal contours. Stable position of support apparatus.  Interval increase in now likely moderate sized, likely partially loculated, right-sided pleural effusion with associated increased right basilar heterogeneous / consolidative opacity.  Persistent cephalization of flow.  Grossly unchanged left basilar opacity.  Unchanged bilateral axillary surgical clips.  No pneumothorax.  Unchanged bones.  IMPRESSION: Interval increase in now likely moderate sized, likely partially loculated, right-sided pleural effusion and associated adjacent right basilar opacities favored to represent atelectasis.   Original Report Authenticated By: Tacey Ruiz, MD    Dg Chest [facility]  06/16/2012  *RADIOLOGY REPORT*  Clinical Data: Respiratory failure, recent right thoracentesis  PORTABLE CHEST - 1 VIEW  Comparison: Portable chest x-ray of 06/14/2012  Findings: There is little change in aeration.  Airspace disease bilaterally right greater than left is stable and there may be a slight increase in volume right pleural effusion.  No pneumothorax is noted.  Mild cardiomegaly stable.  A permanent pacemaker and power port Port-A-Cath remain.  IMPRESSION: Little change in aeration and airspace disease, with possible slight increase in volume of the right pleural effusion.   Original Report Authenticated By: Dwyane Dee, M.D.    Medications:  Scheduled Meds: . albuterol  2.5 mg Nebulization Q6H  . antiseptic oral rinse  15 mL Mouth Rinse  q12n4p  . chlorhexidine  15 mL Mouth Rinse BID  . Chlorhexidine Gluconate Cloth  6 each Topical Q0600  . furosemide  40 mg Oral BID  . hydrALAZINE  25 mg Oral Q8H  . insulin aspart  0-15 Units Subcutaneous TID WC  . insulin aspart  0-5 Units Subcutaneous QHS  . insulin glargine  20 Units Subcutaneous QHS  . metFORMIN  850 mg Oral BID WC  . morphine  5 mg Oral Q6H  . multivitamin with minerals  1 tablet Oral Daily  . pregabalin  200 mg Oral BID  . sodium chloride  3 mL Intravenous Q12H  . spironolactone  50 mg Oral Daily   Continuous Infusions: . sodium chloride 20 mL/hr at 06/14/12 1900  . heparin 1,300 Units/hr (06/16/12 1200)   PRN Meds:.acetaminophen, albuterol, fentaNYL, ondansetron (ZOFRAN) IV, traMADol  Assessment/Plan: 67 y.o metastatic right invasive breast cancer, carcinoid of right lower lung, decline in health since 02/2012 admitted 2/21 with acute encephalopathy (resolved),  post multiple falls at home, acute on chronic respiratory failure (mulifactorial) likely due to recurrent pleural effusion.  Initially, she had failure on bipap on admission and was intubated 2/22 now post extubation 2/23.  She has also had AKI this admission.   1. Acute on chronic respiratory failure (likely multifactorial but acute due to recent recurrent pleural effusion) -Failed bipap on admission then intubated 2/22-2/23  -respiratory status improving now on [place] with bipap at night  -recurrent pleural effusion-CTS consulted (Dr. Tyrone Sage) for VATS or pleurex (will consider pleurex cath if pleural effusion reaccumulates); status post thoracentesis 2/26 with few atypical cells  -worsening CXR today with partial loculation and opacities; consider calling CTS to see if they will do pleurex in the future  -History of COPD on 2L at home; continue bronchodilators -History of OSA on cpap at home though patient states bipap helps breathing.  Outpatient she may need her setting adjusted   2. Metastatic  cancer (primary breast cancer, h/o RLL carcinoid)  -now with lytic bone lesions (C6) on Xray and CT chest with suggestions of lymphangitic spread of malignancy and left lobe liver lesion 3.4 x 2.4 cm which could represent metastasis.   -Followed by Dr. [name] who will need to be updated so further treatment options can be reviewed with the patient -At this time patient does not want hospice only a medication to prolong life  -Previously was on Herceptin but was d/c'd out of concern for CHF. Now on Fulvestrant -will need to consult Palliative care to discuss cancer and decline in health to also review/discuss options with patient  3. Right lower extremity DVT -diagnosed 2/7 -on Heparin gtt -Transitioned off Lovenox bid to Heparin during AKI.  Will continue Heparin for now but consider transition back to Lovenox  4. Chronic diastolic heart failure  -Continue Lasix 40 bid.  Home dose is 20 mg qd -monitor BMET.  Creatinine slightly increased today to 1.15  -Strict i/o, daily weights   5. AKI -Overall improved this admission but Creatinine increased to 1.15 today -will trend BMET   6. Diabetes history  -SSI, Lantus 20 units qhs, Metformin 850 mg bid  -monitor cbg (120s-200s)  7. HTN history -Hydralazine 25 mg q8, Lasix 40 bid, Spironolactone 50 mg qd  -monitor BP (140s-150s/50s-60s over 24 hrs)  8. MRSA + nares  -MRSA protocol   9. Normocytic Anemia (borderline microcytic) -hemoglobin 8.1-stable  -trend cbc  10. F/E/N -NS KVO -monitor electrolytes  -heart healthy diet  Code status: Acceptable to intubate. No CPR   Dispo: Disposition is deferred at this time, awaiting improvement of current medical problems.  Anticipated discharge in approximately 2-3 day(s). Patient will possibly need SNF pending PT/OT evaluation.  Transfer to SDU when bed available.    The patient does have a current PCP [name], MD), therefore will be requiring OPC follow-up after discharge.    The patient does not have transportation limitations that hinder transportation to clinic appointments.  Services Needed at time of discharge: Y = Yes, Blank = No PT: Y  OT:   RN: Y  Equipment:   Other: Social work     LOS: 8 days   Melissa Villa [phone] 06/17/2012, 11:47 AM

## 2012-06-18 LAB — CBC WITH DIFFERENTIAL/PLATELET
Eosinophils Relative: 2 % (ref 0–5)
Lymphocytes Relative: 23 % (ref 12–46)
Lymphs Abs: 1.9 10*3/uL (ref 0.7–4.0)
MCV: 71.1 fL — ABNORMAL LOW (ref 78.0–100.0)
Monocytes Relative: 8 % (ref 3–12)
Neutrophils Relative %: 67 % (ref 43–77)
Platelets: 214 10*3/uL (ref 150–400)
RBC: 4.01 MIL/uL (ref 3.87–5.11)
WBC: 8.1 10*3/uL (ref 4.0–10.5)

## 2012-06-18 LAB — HEPARIN LEVEL (UNFRACTIONATED)
Heparin Unfractionated: 0.29 IU/mL — ABNORMAL LOW (ref 0.30–0.70)
Heparin Unfractionated: 0.29 IU/mL — ABNORMAL LOW (ref 0.30–0.70)

## 2012-06-18 LAB — BASIC METABOLIC PANEL
Chloride: 99 mEq/L (ref 96–112)
GFR calc Af Amer: 51 mL/min — ABNORMAL LOW (ref >90)
GFR calc non Af Amer: 44 mL/min — ABNORMAL LOW (ref >90)
Potassium: 3.5 mEq/L (ref 3.5–5.1)
Sodium: 137 mEq/L (ref 135–145)

## 2012-06-18 LAB — MAGNESIUM: Magnesium: 1.6 mg/dL (ref 1.5–2.5)

## 2012-06-18 MED ORDER — POTASSIUM CHLORIDE 10 MEQ/50ML IV SOLN
10.0000 meq | INTRAVENOUS | Status: AC
  Administered 2012-06-18 (×2): 10 meq via INTRAVENOUS
  Filled 2012-06-18 (×2): qty 50

## 2012-06-18 MED ORDER — HEPARIN BOLUS VIA INFUSION
1000.0000 [IU] | Freq: Once | INTRAVENOUS | Status: AC
  Administered 2012-06-18: 1000 [IU] via INTRAVENOUS
  Filled 2012-06-18: qty 1000

## 2012-06-18 MED ORDER — HEPARIN (PORCINE) IN NACL 100-0.45 UNIT/ML-% IJ SOLN
1700.0000 [IU]/h | INTRAMUSCULAR | Status: DC
  Administered 2012-06-18 – 2012-06-20 (×4): 1700 [IU]/h via INTRAVENOUS
  Filled 2012-06-18 (×7): qty 250

## 2012-06-18 MED ORDER — FUROSEMIDE 40 MG PO TABS
40.0000 mg | ORAL_TABLET | Freq: Every day | ORAL | Status: DC
  Administered 2012-06-19: 40 mg via ORAL
  Filled 2012-06-18: qty 1

## 2012-06-18 NOTE — Progress Notes (Signed)
Subjective: No acute events overnight.  Feels like she is breathing relatively well, and was up in the chair yesterday.  Objective: Vital signs in last 24 hours: Filed Vitals:   06/18/12 0436 06/18/12 0500 06/18/12 0527 06/18/12 0600  BP: 151/59  140/49   Pulse: 100 98  101  Temp: 98 F (36.7 C)     TempSrc: Oral     Resp: 25 23  25   Height:      Weight:   274 lb 11.1 oz (124.6 kg)   SpO2: 99% 97%  97%   Weight change:   Intake/Output Summary (Last 24 hours) at 06/18/12 4098 Last data filed at 06/18/12 1191  Gross per 24 hour  Intake 1639.15 ml  Output   1400 ml  Net 239.15 ml   General: lying in bed, nad, alert and oriented x 3  CV: tachycardic, no rubs, murmurs, gallops  Lungs: improved rhonchi b/l (R>L) and expiratory wheezing  Abdomen: obese, soft, NT/ND, normoactive BS  Extremities: warm, no cyanosis, +edema lower extremities b/l 1-2 +  Neuro: alert and oriented x 3. Able to move all 4 extremities  Psych: flat affect  Lab Results: Basic Metabolic Panel:  Recent Labs Lab 06/15/12 0445 06/16/12 0500 06/17/12 0500 06/17/12 0720 06/18/12 0505  NA 138 139  --  137 137  K 4.6 4.6  --  3.8 3.5  CL 102 100  --  99 99  CO2 31 34*  --  34* 34*  GLUCOSE 247* 170*  --  155* 134*  BUN 18 16  --  15 16  CREATININE 1.10 1.09  --  1.15* 1.26*  CALCIUM 9.5 9.4  --  9.5 9.2  MG 1.5 1.3* 1.9  --   --   PHOS 3.7  --  4.3  --   --    CBC:  Recent Labs Lab 06/17/12 0500 06/18/12 0505  WBC 8.4 8.1  NEUTROABS 5.7 5.4  HGB 8.1* 8.0*  HCT 29.1* 28.5*  MCV 70.6* 71.1*  PLT 235 214   BNP:  Recent Labs Lab 06/12/12 1100 06/13/12 0400 06/14/12 0300  PROBNP 637.0* 661.0* 710.1*   CBG:  Recent Labs Lab 06/16/12 2238 06/17/12 0725 06/17/12 1132 06/17/12 1542 06/17/12 1942 06/17/12 2155  GLUCAP 128* 156* 291* 182* 155* 164*    Micro Results: Recent Results (from the past 240 hour(s))  URINE CULTURE     Status: None   Collection Time    06/09/12  1:18  PM      Result Value Range Status   Specimen Description URINE, CATHETERIZED   Final   Special Requests NONE   Final   Culture  Setup Time 06/09/2012 14:58   Final   Colony Count NO GROWTH   Final   Culture NO GROWTH   Final   Report Status 06/10/2012 FINAL   Final  CULTURE, BLOOD (ROUTINE X 2)     Status: None   Collection Time    06/09/12  4:38 PM      Result Value Range Status   Specimen Description BLOOD HAND LEFT   Final   Special Requests BOTTLES DRAWN AEROBIC AND ANAEROBIC 10CC   Final   Culture  Setup Time 06/09/2012 22:52   Final   Culture NO GROWTH 5 DAYS   Final   Report Status 06/15/2012 FINAL   Final  CULTURE, BLOOD (ROUTINE X 2)     Status: None   Collection Time    06/09/12  4:47 PM  Result Value Range Status   Specimen Description BLOOD HAND LEFT   Final   Special Requests BOTTLES DRAWN AEROBIC ONLY 1CC   Final   Culture  Setup Time 06/09/2012 22:52   Final   Culture NO GROWTH 5 DAYS   Final   Report Status 06/15/2012 FINAL   Final  MRSA PCR SCREENING     Status: Abnormal   Collection Time    06/09/12  9:20 PM      Result Value Range Status   MRSA by PCR POSITIVE (*) NEGATIVE Final   Comment:            The GeneXpert MRSA Assay (FDA     approved for NASAL specimens     only), is one component of a     comprehensive MRSA colonization     surveillance program. It is not     intended to diagnose MRSA     infection nor to guide or     monitor treatment for     MRSA infections.     RESULT CALLED TO, READ BACK BY AND VERIFIED WITHTonette Bihari RN [phone] 06/09/12 A BROWNING  URINE CULTURE     Status: None   Collection Time    06/10/12  5:20 AM      Result Value Range Status   Specimen Description URINE, CATHETERIZED   Final   Special Requests Immunocompromised   Final   Culture  Setup Time 06/10/2012 12:38   Final   Colony Count NO GROWTH   Final   Culture NO GROWTH   Final   Report Status 06/11/2012 FINAL   Final  CLOSTRIDIUM DIFFICILE BY PCR      Status: None   Collection Time    06/11/12  6:24 AM      Result Value Range Status   C difficile by pcr NEGATIVE  NEGATIVE Final   Studies/Results: Dg Chest Port 1 View 06/17/2012  IMPRESSION: Interval increase in now likely moderate sized, likely partially loculated, right-sided pleural effusion and associated adjacent right basilar opacities favored to represent atelectasis.   Dg Chest Port 1 View 06/16/2012   IMPRESSION: Little change in aeration and airspace disease, with possible slight increase in volume of the right pleural effusion.   Medications: I have reviewed the patient's current medications. Scheduled Meds: . albuterol  2.5 mg Nebulization Q6H  . antiseptic oral rinse  15 mL Mouth Rinse q12n4p  . chlorhexidine  15 mL Mouth Rinse BID  . furosemide  40 mg Oral BID  . hydrALAZINE  25 mg Oral Q8H  . insulin aspart  0-15 Units Subcutaneous TID WC  . insulin aspart  0-5 Units Subcutaneous QHS  . insulin glargine  20 Units Subcutaneous QHS  . metFORMIN  850 mg Oral BID WC  . morphine  5 mg Oral Q6H  . multivitamin with minerals  1 tablet Oral Daily  . potassium chloride  10 mEq Intravenous Q1 Hr x 2  . pregabalin  200 mg Oral BID  . sodium chloride  3 mL Intravenous Q12H  . spironolactone  50 mg Oral Daily   Continuous Infusions: . sodium chloride 20 mL/hr at 06/14/12 1900  . heparin 1,400 Units/hr (06/18/12 0600)   PRN Meds:.acetaminophen, albuterol, fentaNYL, ondansetron (ZOFRAN) IV, traMADol  Assessment/Plan: 67 y.o metastatic right invasive breast cancer, carcinoid of right lower lung, decline in health since 02/2012 admitted 2/21 with acute encephalopathy (resolved), post multiple falls at home, acute on chronic respiratory  failure (mulifactorial) likely due to recurrent pleural effusion. Initially, she had failure on bipap on admission and was intubated 2/22 now post extubation 2/23. She has also had AKI this admission.   # Acute on chronic respiratory failure  (likely multifactorial but acutely due to recent recurrent pleural effusion); Failed bipap on admission then intubated 2/22-2/23; history of COPD on 2L at home; history of OSA on cpap at home though patient states bipap helps breathing. Outpatient she may need her setting adjusted  -respiratory status improving now on [place] with bipap at night  -recurrent pleural effusion-CTS consulted (Dr. Tyrone Sage) for VATS or pleurex (will consider pleurex cath if pleural effusion reaccumulates); status post thoracentesis 2/26 with few atypical cells  -06/17/12 worsening CXR with partial loculation and opacities; monitor for desaturation, if decompensates, discuss role for pleurex with CTS -continue bronchodilators but will d/c scheduled albuterol neb in setting of tachycardia and improved lung exam -PT/OT - patient deconditioned  # Chronic diastolic heart failure  -Has been getting Lasix 40 bid. Home dose is 20 mg qd  -monitor BMET. Creatinine slightly increased again today to 1.26 --> will change to lasix 40 daily  -Strict i/o, daily weights   # AKI  -Overall improved this admission but Creatinine increased to 1.26 today  -will trend BMET   # Right lower extremity DVT  -diagnosed 2/7  -on Heparin gtt  -Transitioned off Lovenox bid to Heparin during AKI. Will continue Heparin for now but consider transition back to Lovenox once renal fxn stabilizes  # Metastatic cancer (primary breast cancer, h/o RLL carcinoid)  -now with lytic bone lesions (C6) on Xray and CT chest with suggestions of lymphangitic spread of malignancy and left lobe liver lesion 3.4 x 2.4 cm which could represent metastasis.  -Followed by Dr. [name] who will need to be updated so further treatment options can be reviewed with the patient  -At this time patient does not want hospice only a medication to prolong life  -Previously was on Herceptin but was d/c'd out of concern for CHF. Now on Fulvestrant  -Palliative care consulted to discuss  cancer and decline in health to also review/discuss options with patient   # Diabetes history  -SSI, Lantus 20 units qhs, Metformin 850 mg bid  -monitor cbg (120s-200s)   # HTN history  -Hydralazine 25 mg q8, Lasix 40 daily, Spironolactone 50 mg qd  -monitor BP (140s-150s/50s-60s over 24 hrs)   # MRSA + nares  -MRSA protocol   # Normocytic Anemia (borderline microcytic)  -hemoglobin 8-stable  -trend cbc every other day  # F/E/N  -NS KVO  -monitor electrolytes  -heart healthy diet  Code status: Acceptable to intubate. No CPR   Dispo: Disposition is deferred at this time, awaiting improvement of current medical problems.  Anticipated discharge in approximately 2-3 day(s). Awaiting SDU bed, but if doing ok today, will downgrade to tele later this afternoon  The patient does have a current PCP [name], MD), therefore will be requiring OPC follow-up after discharge.   The patient does not have transportation limitations that hinder transportation to clinic appointments.  .Services Needed at time of discharge: Y = Yes, Blank = No PT:   OT:   RN:   Equipment:   Other:     LOS: 9 days   SHARDA, NEEMA 06/18/2012, 7:13 AM

## 2012-06-18 NOTE — Progress Notes (Addendum)
ANTICOAGULATION CONSULT NOTE - Follow Up Consult  Pharmacy Consult for Heparin Indication: Hx recent RLE DVT (05/26/12)  No Known Allergies  Patient Measurements: Height: 5\' 3"  (160 cm) Weight: 274 lb 11.1 oz (124.6 kg) IBW/kg (Calculated) : 52.4 Heparin Dosing Weight: 83.5 kg  Vital Signs: Temp: 98.4 F (36.9 C) (03/02 0757) Temp src: Oral (03/02 0757) BP: 150/59 mmHg (03/02 1200) Pulse Rate: 99 (03/02 1200)  Labs:  Recent Labs  06/16/12 0500 06/17/12 0500 06/17/12 0720 06/18/12 0505 06/18/12 1200  HGB 8.1* 8.1*  --  8.0*  --   HCT 29.3* 29.1*  --  28.5*  --   PLT 265 235  --  214  --   HEPARINUNFRC 0.36 0.38  --  0.29* 0.29*  CREATININE 1.09  --  1.15* 1.26*  --     Estimated Creatinine Clearance: 57.9 ml/min (by C-G formula based on Cr of 1.26).   Medications:  Heparin @ 1400 units/hr (13 ml/hr)  Assessment: 67 y.o. F on heparin for recent RLE DVT (Feb '14) with a slightly SUBtherapeutic heparin level this afternoon despite a rate increase this morning (HL 0.29 << 0.29, goal of 0.3-0.7). The patient is s/p chest tube for pleural effusions from 2/7-2/17 and repeat thoracentesis on 2/26 with ~370 cc of fluid removed. Hgb/Hct/Plt stable. No overt s/sx of bleeding noted. Will f/u plans to transition back to lovenox once stable.   Goal of Therapy:  Heparin level 0.3-0.7 units/ml Monitor platelets by anticoagulation protocol: Yes   Plan:  1. Increase heparin drip rate to 1550 units/hr (15.5 ml/hr) 2. Will continue to monitor for any signs/symptoms of bleeding and will follow up with heparin level in 6 hours   Georgina Pillion, PharmD, BCPS Clinical Pharmacist Pager: [phone] 06/18/2012 1:08 PM    ------------------------------------------------------------------ Evening Addendum  Assessment: 67 y.o. F who continues on heparin for RLE DVT with a SUBtherapeutic heparin level this evening (HL 0.28 << 0.29, goal of 0.3-0.7) despite multiple rate increases today.  Nurse checked line and verified that heparin infusing appropriately -- no issues. Will give small bolus and increase. No bleeding noted at this time.  Plan: 1. Heparin bolus of 1000 units x 1 2. Increase heparin drip rate to 1700 units/hr (17 ml/hr) 3. Will continue to monitor for any signs/symptoms of bleeding and will follow up with heparin level in the a.m.   Georgina Pillion, PharmD, BCPS Clinical Pharmacist Pager: [phone] 06/18/2012 7:44 PM

## 2012-06-18 NOTE — Progress Notes (Signed)
Thank you for consulting the Palliative Medicine Team at [facility] to meet your patient's and family's needs.   The reason that you asked [place] to see your patient is  For Goals of Care.  We have scheduled your patient for a meeting: Tuesday 3/4 at 11AM which is the earliest time we have available.  The Surrogate decision make is: Shari Heritage Derwood Kaplan [phone] Contact information:above  Other family members that need to be present: none    Your patient is able/unable to participate:yes  Additional Narrative:  We are happy to assist with a goals of care discussion, due to high team census and consult volume our first available time is Tuesday 3/4 at 11AM. If her condition changes or there are acute needs please call team phone [phone] or page on call physician.  Anderson Malta, DO Palliative Medicine

## 2012-06-18 NOTE — Progress Notes (Signed)
[facility] ADULT ICU REPLACEMENT PROTOCOL FOR AM LAB REPLACEMENT ONLY  The patient does apply for the [facility] Adult ICU Electrolyte Replacment Protocol based on the criteria listed below:   1. Is GFR >/= 40 ml/min? yes  Patient's GFR today is 51 2. Is urine output >/= 0.5 ml/kg/hr for the last 6 hours? yes Patient's UOP is 0.5 ml/kg/hr 3. Is BUN < 60 mg/dL? yes  Patient's BUN today is 16 4. Abnormal electrolyte(s):k3.5) 5. Ordered repletion with: [id number](IV)     Melrose Nakayama 06/18/2012 6:33 AM

## 2012-06-18 NOTE — Progress Notes (Signed)
ANTICOAGULATION CONSULT NOTE - Follow Up Consult  Pharmacy Consult for heparin Indication: recent DVT  Labs:  Recent Labs  06/16/12 0500 06/17/12 0500 06/17/12 0720 06/18/12 0505  HGB 8.1* 8.1*  --  8.0*  HCT 29.3* 29.1*  --  28.5*  PLT 265 235  --  214  HEPARINUNFRC 0.36 0.38  --  0.29*  CREATININE 1.09  --  1.15*  --     Assessment: 67yo female now slightly subtherapeutic on heparin after stable levels at current rate; of note RN says pt may have bumped IV site, though given levels at low end of goal at current rate there is room to move if most recent lab is not entirely accurate.  Goal of Therapy:  Heparin level 0.3-0.7 units/ml   Plan:  Will increase heparin gtt by 1 unit/kg/hr to 1400 units/hr and check level in 6hr.  Vernard Gambles, PharmD, BCPS  06/18/2012,5:49 AM

## 2012-06-19 ENCOUNTER — Inpatient Hospital Stay (HOSPITAL_COMMUNITY): Payer: Medicare Other

## 2012-06-19 LAB — CBC WITH DIFFERENTIAL/PLATELET
Basophils Absolute: 0.1 10*3/uL (ref 0.0–0.1)
Eosinophils Absolute: 0.2 10*3/uL (ref 0.0–0.7)
Lymphocytes Relative: 19 % (ref 12–46)
MCH: 19.8 pg — ABNORMAL LOW (ref 26.0–34.0)
MCHC: 27.6 g/dL — ABNORMAL LOW (ref 30.0–36.0)
Monocytes Absolute: 0.8 10*3/uL (ref 0.1–1.0)
Neutro Abs: 5.9 10*3/uL (ref 1.7–7.7)
Neutrophils Relative %: 69 % (ref 43–77)
RDW: 23.9 % — ABNORMAL HIGH (ref 11.5–15.5)

## 2012-06-19 LAB — GLUCOSE, CAPILLARY
Glucose-Capillary: 143 mg/dL — ABNORMAL HIGH (ref 70–99)
Glucose-Capillary: 168 mg/dL — ABNORMAL HIGH (ref 70–99)
Glucose-Capillary: 176 mg/dL — ABNORMAL HIGH (ref 70–99)

## 2012-06-19 LAB — BASIC METABOLIC PANEL
Calcium: 9.3 mg/dL (ref 8.4–10.5)
Creatinine, Ser: 1.27 mg/dL — ABNORMAL HIGH (ref 0.50–1.10)
GFR calc non Af Amer: 44 mL/min — ABNORMAL LOW (ref >90)
Glucose, Bld: 124 mg/dL — ABNORMAL HIGH (ref 70–99)
Sodium: 136 mEq/L (ref 135–145)

## 2012-06-19 LAB — HEPARIN LEVEL (UNFRACTIONATED): Heparin Unfractionated: 0.45 IU/mL (ref 0.30–0.70)

## 2012-06-19 MED ORDER — FUROSEMIDE 20 MG PO TABS
20.0000 mg | ORAL_TABLET | Freq: Every day | ORAL | Status: DC
  Administered 2012-06-20 – 2012-06-21 (×2): 20 mg via ORAL
  Filled 2012-06-19 (×2): qty 1

## 2012-06-19 MED ORDER — ALBUTEROL SULFATE (5 MG/ML) 0.5% IN NEBU
2.5000 mg | INHALATION_SOLUTION | Freq: Four times a day (QID) | RESPIRATORY_TRACT | Status: DC
  Administered 2012-06-20 – 2012-06-21 (×4): 2.5 mg via RESPIRATORY_TRACT
  Filled 2012-06-19 (×4): qty 0.5

## 2012-06-19 NOTE — Progress Notes (Signed)
Thank you for consult on Melissa Villa . Progress and therapy notes reviewed. Do not feel  that patient will  be able to tolerate CIR level therapies. SNF level therapies recommended.  Would  recommend  SNF or home with family providing assistance.

## 2012-06-19 NOTE — Progress Notes (Signed)
Internal Medicine Teaching Service Attending Note Date: 06/19/2012  Patient name: Melissa Villa  Medical record number: [id number]  Date of birth: 17-May-1945  I met with Melissa Villa, her son and daughter in law today at about 5 pm. She is a 67 year old woman with history of breast cancer x 2, carcinoid of the right lower lobe, diastolic heart failure, chronic obstructive pulmonary disease, diabetes, and complete heart block requiring dual-chamber pacemaker, who has been admitted twice now with increasing shortness of breath due to pleural effusion. She does not complain of any new symptoms.   Filed Vitals:   06/19/12 1630  BP: 161/86  Pulse: 100  Temp: 97.5 F (36.4 C)  Resp: 18   General: Lying flat in bed, no acute distress. Heart: S1S2 normal, regular rhythm, no murmurs.  Lungs: rales all lung fields, wheezing.  Abdomen: very obese, firm, non-tender. Extremities: mild pedal edema    Labs reviewed.  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 She was on Herceptin which has been discontinued because of chronic diastolic heart failure. She has been seen by Dr. Valentino Hue Magrinat, on 06/16/12 and he thinks the patient does not need hospice at this time. The family is also doing a palliative care meeting tomorrow to improve the general quality of life of the patient. The family does not want hospice at this time. I had a lengthy discussion with the son and the patient, and explained the notes of Dr. Raymond Gurney and the reason for doing the palliative care consult. The patient was confused regarding the kind of treatment that she might still have a chance at, [name] said that Dr. Madelyn Brunner note indicated that he wanted to meet her on 06/29/12 appointment and discuss her options to manage and not cure, her cancer.  Regarding her recurrent pleural effusions, she currently is not re-accumalating post thoracentesis. CTS has been consulted for VATS or  pleurex catheter.   She is on heparin infusion for right lower extremity DVT.  Kidney function is deteriorating.   Recent Labs Lab 06/15/12 0445 06/16/12 0500 06/17/12 0720 06/18/12 0505 06/19/12 0230  CREATININE 1.10 1.09 1.15* 1.26* 1.27*   Platelets are decreasing. Will monitor.   Recent Labs Lab 06/15/12 0445 06/16/12 0500 06/17/12 0500 06/18/12 0505 06/19/12 0230  PLT 254 265 235 214 198   On lasix for diastolic heart failure. We are planning to reduce lasix to home dose of 20mg  daily.   Intake/Output Summary (Last 24 hours) at 06/19/12 1805 Last data filed at 06/19/12 1500  Gross per 24 hour  Intake   1214 ml  Output   1575 ml  Net   -361 ml   On SSI for diabetes. Keep goal blood sugar in 140-180.   Recent Labs Lab 06/18/12 1714 06/18/12 2238 06/19/12 0724 06/19/12 1148 06/19/12 1733  GLUCAP 91 144* 143* 168* 160*    Rest per resident note.    Aletta Edouard 06/19/2012, 5:37 PM.

## 2012-06-19 NOTE — Progress Notes (Signed)
ANTICOAGULATION CONSULT NOTE - Follow Up Consult  Pharmacy Consult for Heparin Indication: Hx recent RLE DVT (05/26/12)  No Known Allergies  Patient Measurements: Height: 5\' 3"  (160 cm) Weight: 274 lb 11.1 oz (124.6 kg) IBW/kg (Calculated) : 52.4 Heparin Dosing Weight: 83.5 kg  Vital Signs: Temp: 97.6 F (36.4 C) (03/03 1218) Temp src: Oral (03/03 1218) BP: 140/73 mmHg (03/03 0800) Pulse Rate: 109 (03/03 1200)  Labs:  Recent Labs  06/17/12 0500 06/17/12 0720 06/18/12 0505  06/18/12 1900 06/19/12 0230 06/19/12 0914  HGB 8.1*  --  8.0*  --   --  7.9*  --   HCT 29.1*  --  28.5*  --   --  28.6*  --   PLT 235  --  214  --   --  198  --   HEPARINUNFRC 0.38  --  0.29*  < > 0.28* 0.45 0.43  CREATININE  --  1.15* 1.26*  --   --  1.27*  --   < > = values in this interval not displayed.  Estimated Creatinine Clearance: 57.4 ml/min (by C-G formula based on Cr of 1.27).   Medications:  Heparin @ 1400 units/hr (13 ml/hr)  Assessment: 67 y.o. F on heparin for recent RLE DVT (Feb '14) continues with a therapeutic heparin level (0.43). The patient is s/p chest tube for pleural effusions from 2/7-2/17 and repeat thoracentesis on 2/26 with ~370 cc of fluid removed. Hgb/Hct/Plt stable. No overt s/sx of bleeding noted. Will f/u plans to transition back to lovenox once stable.   Goal of Therapy:  Heparin level 0.3-0.7 units/ml Monitor platelets by anticoagulation protocol: Yes   Plan:  1. Continue heparin drip rate at 1700 units/hr 2. Will continue to monitor for any signs/symptoms of bleeding 3. Daily heparin level and CBC  Christoper Fabian, PharmD, BCPS Clinical pharmacist, pager [phone] 06/19/2012 12:51 PM

## 2012-06-19 NOTE — Progress Notes (Addendum)
NURSING PROGRESS NOTE  Melissa Villa [id number] Transfer Data: 06/19/2012 5:23 PM Attending Provider: Kalman Shan, MD WUJ:WJXBJY,NWGNF DALE, MD Code Status: partial   Melissa Villa is a 67 y.o. female patient transferred from 2900 No acute distress noted.  No c/o shortness of breath, no c/o chest pain.  Cardiac tele # [phone], in place, cardiac monitor yields:normal sinus rhythm.  Blood pressure 161/86, pulse 100, temperature 97.5 F (36.4 C), temperature source Oral, resp. rate 18, height 5\' 4"  (1.626 m), weight 124.7 kg (274 lb 14.6 oz), SpO2 91.00%.   IV Fluids:  IV in place, occlusive dsg intact without redness, IV cath port a cath none.   Allergies:  Review of patient's allergies indicates no known allergies.  Past Medical History:   has a past medical history of CHF (congestive heart failure); COPD (chronic obstructive pulmonary disease); Degenerative joint disease; AV block, complete; Pacemaker; Adenomatous polyp of colon; Hypercholesteremia; Obstructive sleep apnea; Peripheral autonomic neuropathy due to diabetes mellitus; Carcinoid tumor; Constipation; Hand pain; Palpitation; Rib pain; DM type 2 (diabetes mellitus, type 2); Skin rash; Urinary incontinence; Proliferative diabetic retinopathy(362.02); Vitreous hemorrhage; Dyspnea on exertion; Shingles rash (03/25/11); Breast cancer; Breast cancer, stage 3 (102/13); Allergy; Asthma; Bronchitis; Cataract; DM hyperosmolarity type II; Hypertension; TIA (transient ischemic attack); Anemia; DJD (degenerative joint disease); DM neuropathies; and Renal insufficiency.  Past Surgical History:   has past surgical history that includes Mastectomy partial / lumpectomy w/ axillary lymphadenectomy (1992, 1995); Lung lobectomy (06/13/2001); Pacemaker insertion (04/20/2001); Mastectomy partial / lumpectomy; Lobectomy; Pacemaker insertion; Cholecystectomy; Tonsillectomy; and port  a  cath.  Social History:   reports that she quit smoking about 11  years ago. Her smoking use included Cigarettes. She has a 15 pack-year smoking history. She has never used smokeless tobacco. She reports that she does not drink alcohol or use illicit drugs.  Skin: intact  Orientation to room, and floor completed with information packet given to patient/family.   SR up x 2, fall assessment complete, with patient and family able to verbalize understanding of risk associated with falls, and verbalized understanding to call for assistance before getting out of bed.   Call light within reach. Patient able to voice and demonstrate understanding of unit orientation instructions.   Will cont to eval and treat per MD orders.  Renlee Floor, Elmarie Mainland, RN

## 2012-06-19 NOTE — Progress Notes (Addendum)
Subjective: 8/10 h/a generalized today.  She states she has been waking up in the am with h/a.  She takes a medication at home for h/a but cant think of the name.  She denies visual changes.  She is eating okay.  She also notes short term memory loss since 02/2012.  She states she is breathing better.  Denies abdominal pain.  Last bowel movement was yesterday.  If she has an option she would like to go home rather than SNF at discharge.   Objective: Vital signs in last 24 hours: Filed Vitals:   06/19/12 0700 06/19/12 0745 06/19/12 0800 06/19/12 0900  BP:   140/73   Pulse: 104  106 102  Temp:  98.2 F (36.8 C)    TempSrc:  Oral    Resp: 25  24 24   Height:      Weight:      SpO2: 95%  96% 96%   Weight change:   Intake/Output Summary (Last 24 hours) at 06/19/12 1053 Last data filed at 06/19/12 1000  Gross per 24 hour  Intake 1676.5 ml  Output    475 ml  Net 1201.5 ml   General: sitting in recliner, nad, alert and oriented x 3  HEENT: [place]/at  CV: slightly tachycardic, no rubs, murmurs or gallops.  Left chest with port Lungs: wheezes b/l  Abdomen:  Obese, soft, ntnd, normal bs.  Extremities: 2+ lower extremity edema b/l, warm, no cyanosis  Neuro: CN 2-12 grossly intact, moving all 4 extremities   Lab Results: Basic Metabolic Panel:  Recent Labs Lab 06/15/12 0445  06/17/12 0500  06/18/12 0505 06/19/12 0230  NA 138  < >  --   < > 137 136  K 4.6  < >  --   < > 3.5 3.7  CL 102  < >  --   < > 99 98  CO2 31  < >  --   < > 34* 32  GLUCOSE 247*  < >  --   < > 134* 124*  BUN 18  < >  --   < > 16 15  CREATININE 1.10  < >  --   < > 1.26* 1.27*  CALCIUM 9.5  < >  --   < > 9.2 9.3  MG 1.5  < > 1.9  --  1.6  --   PHOS 3.7  --  4.3  --   --   --   < > = values in this interval not displayed. CBC:  Recent Labs Lab 06/18/12 0505 06/19/12 0230  WBC 8.1 8.7  NEUTROABS 5.4 5.9  HGB 8.0* 7.9*  HCT 28.5* 28.6*  MCV 71.1* 71.5*  PLT 214 198   BNP:  Recent Labs Lab  06/12/12 1100 06/13/12 0400 06/14/12 0300  PROBNP 637.0* 661.0* 710.1*   CBG:  Recent Labs Lab 06/17/12 2155 06/18/12 0738 06/18/12 1119 06/18/12 1714 06/18/12 2238 06/19/12 0724  GLUCAP 164* 137* 196* 91 144* 143*     Micro Results: Recent Results (from the past 240 hour(s))  URINE CULTURE     Status: None   Collection Time    06/09/12  1:18 PM      Result Value Range Status   Specimen Description URINE, CATHETERIZED   Final   Special Requests NONE   Final   Culture  Setup Time 06/09/2012 14:58   Final   Colony Count NO GROWTH   Final   Culture NO GROWTH   Final  Report Status 06/10/2012 FINAL   Final  CULTURE, BLOOD (ROUTINE X 2)     Status: None   Collection Time    06/09/12  4:38 PM      Result Value Range Status   Specimen Description BLOOD HAND LEFT   Final   Special Requests BOTTLES DRAWN AEROBIC AND ANAEROBIC 10CC   Final   Culture  Setup Time 06/09/2012 22:52   Final   Culture NO GROWTH 5 DAYS   Final   Report Status 06/15/2012 FINAL   Final  CULTURE, BLOOD (ROUTINE X 2)     Status: None   Collection Time    06/09/12  4:47 PM      Result Value Range Status   Specimen Description BLOOD HAND LEFT   Final   Special Requests BOTTLES DRAWN AEROBIC ONLY 1CC   Final   Culture  Setup Time 06/09/2012 22:52   Final   Culture NO GROWTH 5 DAYS   Final   Report Status 06/15/2012 FINAL   Final  MRSA PCR SCREENING     Status: Abnormal   Collection Time    06/09/12  9:20 PM      Result Value Range Status   MRSA by PCR POSITIVE (*) NEGATIVE Final   Comment:            The GeneXpert MRSA Assay (FDA     approved for NASAL specimens     only), is one component of a     comprehensive MRSA colonization     surveillance program. It is not     intended to diagnose MRSA     infection nor to guide or     monitor treatment for     MRSA infections.     RESULT CALLED TO, READ BACK BY AND VERIFIED WITHSalena Saner [facility] RN 2130 06/09/12 A BROWNING  URINE CULTURE     Status:  None   Collection Time    06/10/12  5:20 AM      Result Value Range Status   Specimen Description URINE, CATHETERIZED   Final   Special Requests Immunocompromised   Final   Culture  Setup Time 06/10/2012 12:38   Final   Colony Count NO GROWTH   Final   Culture NO GROWTH   Final   Report Status 06/11/2012 FINAL   Final  CLOSTRIDIUM DIFFICILE BY PCR     Status: None   Collection Time    06/11/12  6:24 AM      Result Value Range Status   C difficile by pcr NEGATIVE  NEGATIVE Final   Studies/Results: Dg Chest 2 View  06/19/2012  *RADIOLOGY REPORT*  Clinical Data: Weakness.  Short of breath.  Follow-up pleural effusion.  CHEST - 2 VIEW  Comparison: 06/17/2012  Findings: Pacemaker remains in place.  Power port remains in place with its tip at the SVC/RA junction.  There is less pleural fluid on the right than seen previously with less basilar atelectasis. In general, there does appear to be pulmonary venous hypertension without frank edema.  IMPRESSION: Less pleural fluid and atelectasis on the right.  Persistent pulmonary venous hypertension.   Original Report Authenticated By: Paulina Fusi, M.D.    Dg Chest Port 1 View  06/17/2012  *RADIOLOGY REPORT*  Clinical Data: Pleural effusion  PORTABLE CHEST - 1 VIEW  Comparison: 06/16/2012; 06/14/2012; 06/12/2012; chest CT - 06/12/2012; ultrasound-guided right-sided thoracentesis - 06/14/2012  Findings:  Grossly unchanged cardiac silhouette and mediastinal contours. Stable  position of support apparatus.  Interval increase in now likely moderate sized, likely partially loculated, right-sided pleural effusion with associated increased right basilar heterogeneous / consolidative opacity.  Persistent cephalization of flow.  Grossly unchanged left basilar opacity.  Unchanged bilateral axillary surgical clips.  No pneumothorax.  Unchanged bones.  IMPRESSION: Interval increase in now likely moderate sized, likely partially loculated, right-sided pleural effusion and  associated adjacent right basilar opacities favored to represent atelectasis.   Original Report Authenticated By: Tacey Ruiz, MD    Medications:  Scheduled Meds: . antiseptic oral rinse  15 mL Mouth Rinse q12n4p  . chlorhexidine  15 mL Mouth Rinse BID  . furosemide  40 mg Oral Daily  . hydrALAZINE  25 mg Oral Q8H  . insulin aspart  0-15 Units Subcutaneous TID WC  . insulin aspart  0-5 Units Subcutaneous QHS  . insulin glargine  20 Units Subcutaneous QHS  . morphine  5 mg Oral Q6H  . multivitamin with minerals  1 tablet Oral Daily  . pregabalin  200 mg Oral BID  . sodium chloride  3 mL Intravenous Q12H  . spironolactone  50 mg Oral Daily   Continuous Infusions: . sodium chloride 20 mL/hr at 06/14/12 1900  . heparin 1,700 Units/hr (06/19/12 0610)   PRN Meds:.acetaminophen, albuterol, fentaNYL, ondansetron (ZOFRAN) IV, traMADol  Assessment/Plan: 67 y.o metastatic right invasive breast cancer, carcinoid of right lower lung, decline in health since 02/2012 admitted 2/21 with acute encephalopathy (resolved),  post multiple falls at home, acute on chronic respiratory failure (mulifactorial) likely due to recurrent pleural effusion.  Initially, she had failure on bipap on admission and was intubated 2/22 now post extubation 2/23.  She has also had AKI this admission.   1. Acute on chronic respiratory failure (likely multifactorial but acute due to recent recurrent pleural effusion) -respiratory status improving now on 3 L [place] with bipap at night -will do biflex at night which can do bipap and cpap; with history of OSA on cpap at home though patient states bipap helps breathing (reasoning for bipap currently qhs).  Outpatient she may need her setting adjusted  -recurrent pleural effusion-CTS consulted (Dr. Tyrone Sage) for VATS or pleurex (will consider pleurex cath if pleural effusion reaccumulates); status post thoracentesis 2/26 with few atypical cells -CXR today with less pleural effusion on  the right  -History of COPD on 2L at home; continue bronchodilators  2. Metastatic cancer (primary breast cancer, h/o RLL carcinoid)  -now with lytic bone lesions (C6) on Xray and CT chest with suggestions of lymphangitic spread of malignancy and left lobe liver lesion 3.4 x 2.4 cm which could represent metastasis.   -Followed by Dr. [name] who will need to be updated so further treatment options can be reviewed with the patient -At this time patient does not want hospice only a medication to prolong life  -Previously was on Herceptin but was d/c'd out of concern for CHF. Now on Fulvestrant -goals of care with PC meeting 3/4 11 am   3. Right lower extremity DVT -diagnosed 2/7 -on Heparin gtt -Transitioned off Lovenox bid to Heparin during AKI.  Will continue Heparin for now but consider transition back to Lovenox  4. Chronic diastolic heart failure  -Continue Lasix 40 qd.  Home dose is 20 mg qd -monitor BMET.  Creatinine stable -Strict i/o, daily weights   5. AKI -Overall improved this admission,  Creatinine slightly elevated but stable 1.27 -will trend BMET   6. Diabetes history  -SSI, Lantus 20  units qhs, Metformin 850 mg bid  -monitor cbg  7. HTN history -Hydralazine 25 mg q8, Lasix 40 qd, Spironolactone 50 mg qd  -monitor BP  8. MRSA + nares  -MRSA protocol   9. Normocytic Anemia (borderline microcytic) -hemoglobin 7.9-stable  -trend cbc   10. F/E/N -NS KVO -monitor electrolytes  -heart healthy diet  Code status: Acceptable to intubate. No CPR   Dispo: Disposition is deferred at this time, awaiting improvement of current medical problems.  Anticipated discharge in approximately 2-3 day(s). Patient will possibly need SNF though patient prefers [facility] and home. Pending PT/OT evaluation.    The patient does have a current PCP [name], MD), therefore will be requiring OPC follow-up after discharge.   The patient does not have transportation limitations that  hinder transportation to clinic appointments.  Services Needed at time of discharge: Y = Yes, Blank = No PT: Y  OT:   RN: Y  Equipment:   Other: Social work     LOS: 10 days   Melissa Villa [phone] 06/19/2012, 10:53 AM

## 2012-06-19 NOTE — Progress Notes (Signed)
Clinical Social Worker met with pt at bedside.  Pt now agreeable to SNF.  CSW staffed case with MD.  CSW to contiue to follow and assist as needed.   Aundra Millet, MSW, [employer] [phone]

## 2012-06-19 NOTE — Progress Notes (Signed)
Physical Therapy Treatment Patient Details Name: Melissa Villa MRN: [id number] DOB: Nov 24, 1945 Today's Date: 06/19/2012 Time: 1150-1229 PT Time Calculation (min): 39 min  PT Assessment / Plan / Recommendation Comments on Treatment Session  Pt s/p Pleural effusion, PNA, VDRF, encephalopathy with h/o breast and lung cancer. Pt globally weak yet very willing to participate. Anticipate slow progress. Noted plans for Palliative meeting for goals of care set for 3/4.     Follow Up Recommendations  SNF     Does the patient have the potential to tolerate intense rehabilitation     Barriers to Discharge        Equipment Recommendations  None recommended by PT;Other (comment)    Recommendations for Other Services    Frequency Min 3X/week   Plan Discharge plan remains appropriate;Frequency remains appropriate    Precautions / Restrictions Precautions Precautions: Fall   Pertinent Vitals/Pain SaO2 >90% on [place] O2 except when laid flat to position maxisky pad (decr to 87%)    Mobility  Bed Mobility Bed Mobility: Rolling Right;Rolling Left Rolling Right: 4: Min assist;With rail Rolling Left: 4: Min assist;With rail Details for Bed Mobility Assistance: pt lifted chair to bed with maxisky after unable to stand pivot; rolling to remove pad and for pericare; pt able to hold herself over on each side 30 sec to 2 minutes Transfers Transfers: Sit to Stand;Stand to Sit Sit to Stand: 1: +2 Total assist;With upper extremity assist;With armrests;From chair/3-in-1 Sit to Stand: Patient Percentage: 60% Stand to Sit: 1: +2 Total assist;To chair/3-in-1 Stand to Sit: Patient Percentage: 20% Details for Transfer Assistance: stood from chair and transitioned hands to RW, however when attempted to initiate wt-shifting for stepping/pivot pt's knees buckled with uncontrolled descent to chair; attempted stand-pivot without RW and 2 person assist and pt unable to clear buttocks off chair; ultimately lifted back  to bed via [facility]    Exercises General Exercises - Lower Extremity Ankle Circles/Pumps: AROM;Both;10 reps;Supine;Seated Short Arc QuadBarbaraann Villa;Both;5 reps;Supine Long Arc Quad: AROM;Both;5 reps;Seated Heel Slides: AAROM;Strengthening;Both;5 reps;Supine (assisted flexion; resisted extension) Straight Leg Raises: AAROM;Both;5 reps;Supine   PT Diagnosis:    PT Problem List:   PT Treatment Interventions:     PT Goals Acute Rehab PT Goals Pt will go Sit to Stand: with mod assist;with upper extremity assist PT Goal: Sit to Stand - Progress: Progressing toward goal Pt will Transfer Bed to Chair/Chair to Bed: with mod assist PT Transfer Goal: Bed to Chair/Chair to Bed - Progress: Progressing toward goal Pt will Perform Home Exercise Program: with supervision, verbal cues required/provided (general LE exercises) PT Goal: Perform Home Exercise Program - Progress: Progressing toward goal  Visit Information  Last PT Received On: 06/19/12 Assistance Needed: +2    Subjective Data  Subjective: Reports she had been getting weaker at home PTA   Cognition  Cognition Overall Cognitive Status: Appears within functional limits for tasks assessed/performed Arousal/Alertness: Awake/alert Orientation Level: Oriented X4 / Intact Behavior During Session: [facility] for tasks performed    Balance  Static Sitting Balance Static Sitting - Balance Support: Feet supported;Bilateral upper extremity supported Static Sitting - Level of Assistance: 5: Stand by assistance Static Sitting - Comment/# of Minutes: sat EOC x 5 minutes with back unsupported while preparing multiple lines for transfer  End of Session PT - End of Session Equipment Utilized During Treatment: Gait belt;Oxygen Activity Tolerance: Patient tolerated treatment well Patient left: in bed;with call bell/phone within reach Nurse Communication: Mobility status   GP     Melissa Villa  06/19/2012, 12:44 PM Pager [phone]

## 2012-06-19 NOTE — Progress Notes (Signed)
ANTICOAGULATION CONSULT NOTE - Follow Up Consult  Pharmacy Consult for heparin Indication: recent DVT  Labs:  Recent Labs  06/16/12 0500 06/17/12 0500 06/17/12 0720 06/18/12 0505 06/18/12 1200 06/18/12 1900 06/19/12 0230  HGB 8.1* 8.1*  --  8.0*  --   --  7.9*  HCT 29.3* 29.1*  --  28.5*  --   --  28.6*  PLT 265 235  --  214  --   --  198  HEPARINUNFRC 0.36 0.38  --  0.29* 0.29* 0.28* 0.45  CREATININE 1.09  --  1.15* 1.26*  --   --   --     Assessment/Plan:  67yo female now therapeutic on heparin after rate increases.  Will continue gtt at current rate and confirm stable with additional level.  Vernard Gambles, PharmD, BCPS  06/19/2012,3:03 AM

## 2012-06-20 DIAGNOSIS — R531 Weakness: Secondary | ICD-10-CM

## 2012-06-20 DIAGNOSIS — R52 Pain, unspecified: Secondary | ICD-10-CM

## 2012-06-20 LAB — CBC WITH DIFFERENTIAL/PLATELET
HCT: 30.3 % — ABNORMAL LOW (ref 36.0–46.0)
Hemoglobin: 8.5 g/dL — ABNORMAL LOW (ref 12.0–15.0)
Lymphocytes Relative: 18 % (ref 12–46)
Lymphs Abs: 1.4 10*3/uL (ref 0.7–4.0)
MCHC: 28.1 g/dL — ABNORMAL LOW (ref 30.0–36.0)
Monocytes Absolute: 0.9 10*3/uL (ref 0.1–1.0)
Monocytes Relative: 11 % (ref 3–12)
Neutro Abs: 5.6 10*3/uL (ref 1.7–7.7)
Neutrophils Relative %: 70 % (ref 43–77)
RBC: 4.3 MIL/uL (ref 3.87–5.11)
WBC: 8.1 10*3/uL (ref 4.0–10.5)

## 2012-06-20 LAB — GLUCOSE, CAPILLARY
Glucose-Capillary: 252 mg/dL — ABNORMAL HIGH (ref 70–99)
Glucose-Capillary: 129 mg/dL — ABNORMAL HIGH (ref 70–99)
Glucose-Capillary: 171 mg/dL — ABNORMAL HIGH (ref 70–99)

## 2012-06-20 LAB — BASIC METABOLIC PANEL
BUN: 14 mg/dL (ref 6–23)
CO2: 31 mEq/L (ref 19–32)
Chloride: 98 mEq/L (ref 96–112)
Creatinine, Ser: 1.09 mg/dL (ref 0.50–1.10)
GFR calc Af Amer: 61 mL/min — ABNORMAL LOW (ref >90)
Glucose, Bld: 156 mg/dL — ABNORMAL HIGH (ref 70–99)
Potassium: 3.6 mEq/L (ref 3.5–5.1)

## 2012-06-20 MED ORDER — SODIUM CHLORIDE 0.9 % IJ SOLN
10.0000 mL | INTRAMUSCULAR | Status: DC | PRN
  Administered 2012-06-20 – 2012-06-21 (×2): 10 mL

## 2012-06-20 MED ORDER — SENNOSIDES-DOCUSATE SODIUM 8.6-50 MG PO TABS
2.0000 | ORAL_TABLET | Freq: Every day | ORAL | Status: DC
  Administered 2012-06-20: 2 via ORAL
  Filled 2012-06-20: qty 2

## 2012-06-20 MED ORDER — MORPHINE SULFATE ER 15 MG PO TBCR
15.0000 mg | EXTENDED_RELEASE_TABLET | Freq: Two times a day (BID) | ORAL | Status: DC
  Administered 2012-06-20 – 2012-06-21 (×3): 15 mg via ORAL
  Filled 2012-06-20 (×3): qty 1

## 2012-06-20 MED ORDER — SODIUM CHLORIDE 0.9 % IJ SOLN: 10.0000 mL | Freq: Two times a day (BID) | INTRAMUSCULAR | Status: DC

## 2012-06-20 MED ORDER — SIMETHICONE 40 MG/0.6ML PO SUSP
40.0000 mg | Freq: Four times a day (QID) | ORAL | Status: DC | PRN
  Administered 2012-06-20 (×2): 40 mg via ORAL
  Filled 2012-06-20 (×2): qty 0.6

## 2012-06-20 MED ORDER — MORPHINE SULFATE 10 MG/5ML PO SOLN
5.0000 mg | ORAL | Status: DC | PRN
  Administered 2012-06-21: 5 mg via ORAL
  Filled 2012-06-20: qty 5

## 2012-06-20 MED ORDER — ENOXAPARIN SODIUM 120 MG/0.8ML ~~LOC~~ SOLN
120.0000 mg | Freq: Two times a day (BID) | SUBCUTANEOUS | Status: DC
  Administered 2012-06-20 – 2012-06-21 (×2): 120 mg via SUBCUTANEOUS
  Filled 2012-06-20 (×5): qty 0.8

## 2012-06-20 NOTE — Clinical Social Work Placement (Addendum)
    Clinical Social Work Department CLINICAL SOCIAL WORK PLACEMENT NOTE 06/20/2012  Patient:  Melissa Villa, Melissa Villa  Account Number:  [id number] Admit date:  06/09/2012  Clinical Social Worker:  Margaree Mackintosh  Date/time:  06/20/2012 12:00 M  Clinical Social Work is seeking post-discharge placement for this patient at the following level of care:   SKILLED NURSING   (*CSW will update this form in Epic as items are completed)   06/20/2012  Patient/family provided with [facility] Department of Clinical Social Work's list of facilities offering this level of care within the geographic area requested by the patient (or if unable, by the patient's family).  06/20/2012  Patient/family informed of their freedom to choose among providers that offer the needed level of care, that participate in Medicare, Medicaid or managed care program needed by the patient, have an available bed and are willing to accept the patient.  06/20/2012  Patient/family informed of [facility]' ownership interest in [facility], as well as of the fact that they are under no obligation to receive care at this facility.  PASARR submitted to EDS on Previous # PASARR number received from EDS on Previous #  FL2 transmitted to all facilities in geographic area requested by pt/family on  06/20/2012 FL2 transmitted to all facilities within larger geographic area on   Patient informed that his/her managed care company has contracts with or will negotiate with  certain facilities, including the following:     Patient/family informed of bed offers received:  06/21/2012 Patient chooses bed at [facility]: [place] Physician recommends and patient chooses bed at  N/A  Patient to be transferred to [facility] [place] on 06/21/2012 Patient to be transferred to facility by [facility]  The following physician request were entered in Epic:   Additional Comments:

## 2012-06-20 NOTE — Progress Notes (Signed)
Subjective: Patient said she was wheezing this am and needed a breathing treatment.  She wants to work with physical therapy to get stronger.  She states she is ready to go home because she has some things to do and she does not know how long she has to live.  She does not want to die in the hospital.  She mentions with exertion at times she can feel palpitations and this has been since placement of the pacemaker in early 2000s.    Objective: Vital signs in last 24 hours: Filed Vitals:   06/19/12 1630 06/19/12 2121 06/19/12 2147 06/20/12 0515  BP: 161/86  165/83 154/71  Pulse: 100 108 112 116  Temp: 97.5 F (36.4 C)  98 F (36.7 C) 99 F (37.2 C)  TempSrc: Oral  Oral Oral  Resp: 18 20 22 22   Height: 5\' 4"  (1.626 m)     Weight: 274 lb 14.6 oz (124.7 kg)     SpO2: 91% 95% 93% 90%   Weight change:   Intake/Output Summary (Last 24 hours) at 06/20/12 0839 Last data filed at 06/20/12 0516  Gross per 24 hour  Intake    479 ml  Output   2000 ml  Net  -1521 ml   General:  Lying in bed, nad, alert and oriented x 3 HEENT: [place]/at  CV: tachycardic, no murmurs Lungs: wheezes and course breath sounds b/l  Abdomen:  Obese, soft, ntnd, normal bs.  Extremities: 1+ lower extremity edema b/l, warm, no cyanosis  Neuro: CN 2-12 grossly intact, moving all 4 extremities   Lab Results: Basic Metabolic Panel:  Recent Labs Lab 06/15/12 0445  06/17/12 0500  06/18/12 0505 06/19/12 0230 06/20/12 0630  NA 138  < >  --   < > 137 136 137  K 4.6  < >  --   < > 3.5 3.7 3.6  CL 102  < >  --   < > 99 98 98  CO2 31  < >  --   < > 34* 32 31  GLUCOSE 247*  < >  --   < > 134* 124* 156*  BUN 18  < >  --   < > 16 15 14   CREATININE 1.10  < >  --   < > 1.26* 1.27* 1.09  CALCIUM 9.5  < >  --   < > 9.2 9.3 10.0  MG 1.5  < > 1.9  --  1.6  --   --   PHOS 3.7  --  4.3  --   --   --   --   < > = values in this interval not displayed. CBC:  Recent Labs Lab 06/19/12 0230 06/20/12 0630  WBC 8.7 8.1   NEUTROABS 5.9 5.6  HGB 7.9* 8.5*  HCT 28.6* 30.3*  MCV 71.5* 70.5*  PLT 198 211   BNP:  Recent Labs Lab 06/14/12 0300  PROBNP 710.1*   CBG:  Recent Labs Lab 06/18/12 2238 06/19/12 0724 06/19/12 1148 06/19/12 1733 06/19/12 2146 06/20/12 0808  GLUCAP 144* 143* 168* 160* 176* 153*     Micro Results: Recent Results (from the past 240 hour(s))  CLOSTRIDIUM DIFFICILE BY PCR     Status: None   Collection Time    06/11/12  6:24 AM      Result Value Range Status   C difficile by pcr NEGATIVE  NEGATIVE Final   Studies/Results: Dg Chest 2 View  06/19/2012  *RADIOLOGY REPORT*  Clinical Data:  Weakness.  Short of breath.  Follow-up pleural effusion.  CHEST - 2 VIEW  Comparison: 06/17/2012  Findings: Pacemaker remains in place.  Power port remains in place with its tip at the SVC/RA junction.  There is less pleural fluid on the right than seen previously with less basilar atelectasis. In general, there does appear to be pulmonary venous hypertension without frank edema.  IMPRESSION: Less pleural fluid and atelectasis on the right.  Persistent pulmonary venous hypertension.   Original Report Authenticated By: Paulina Fusi, M.D.    Medications:  Scheduled Meds: . albuterol  2.5 mg Nebulization QID  . antiseptic oral rinse  15 mL Mouth Rinse q12n4p  . chlorhexidine  15 mL Mouth Rinse BID  . furosemide  20 mg Oral Daily  . hydrALAZINE  25 mg Oral Q8H  . insulin aspart  0-15 Units Subcutaneous TID WC  . insulin aspart  0-5 Units Subcutaneous QHS  . insulin glargine  20 Units Subcutaneous QHS  . morphine  5 mg Oral Q6H  . multivitamin with minerals  1 tablet Oral Daily  . pregabalin  200 mg Oral BID  . sodium chloride  3 mL Intravenous Q12H  . spironolactone  50 mg Oral Daily   Continuous Infusions: . sodium chloride 20 mL/hr at 06/14/12 1900  . 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  Initially, she had failure on bipap on admission and was intubated 2/22 now post extubation 2/23.  She has also had AKI this admission.   1. Acute on chronic respiratory failure (likely multifactorial but acute due to recent recurrent pleural effusion) -respiratory status improving now on 2-3 L  with bipap/cpap at night -recurrent pleural effusion-CTS consulted (Dr. Tyrone Sage) for VATS or pleurex (will consider pleurex cath if pleural effusion reaccumulates but now fluid is less); status post thoracentesis 2/26 with few atypical cells -History of COPD on 2L at home; continue bronchodilators  2. Metastatic cancer (primary breast cancer, h/o RLL carcinoid)  -now with lytic bone lesions (C6) on Xray and CT chest with suggestions of lymphangitic spread of malignancy and left lobe liver lesion 3.4 x 2.4 cm which could represent metastasis.   -Followed by Dr. [name] appt 06/29/12 who will review tx options -goals of care with [facility] meeting 3/4 11 am   3. Right lower extremity DVT -diagnosed 2/7 -on Heparin gtt -Transitioned off Lovenox bid to Heparin during AKI.  Will continue Heparin for now but consider transition back to Lovenox  4. Chronic diastolic heart failure  -Lasix 40 qd decreased to 20 mg qd. -monitor BMET.  Creatinine trending down -Strict i/o, daily weights   5. AKI -Overall improved this admission. Monitor Creatinine.  -will trend BMET   6. Diabetes history  -SSI, Lantus 20 units qhs, Metformin 850 mg bid  -monitor cbg  7. HTN history -Hydralazine 25 mg q8, Lasix 20 qd, Spironolactone 50 mg qd  -monitor BP  8.  MRSA + nares  -MRSA protocol   9. Normocytic Anemia (borderline microcytic)  10. F/E/N -NS KVO -monitor electrolytes  -heart healthy/carb modified diet  Code status: Acceptable to intubate. No CPR   Dispo: Disposition is deferred at  this time, awaiting improvement of current medical problems.  Anticipated discharge in approximately 1-2 day(s). Patient agreeable to SNF  The patient does have a current PCP [name], MD), therefore will be requiring OPC follow-up after discharge.   The patient does not have transportation limitations that hinder transportation to clinic appointments.  Services Needed at time of discharge: Y = Yes, Blank = No PT: Y  OT:   RN: Y  Equipment:   Other: Social work     LOS: 11 days   Melissa Villa [phone] 06/20/2012, 8:39 AM

## 2012-06-20 NOTE — Consult Note (Signed)
Patient Melissa Villa      DOB: August 03, 1945      BJY:[id number]     Consult Note from the Palliative Medicine Team at [facility]    Consult Requested by: Dr Aletta Edouard     PCP: [name], MD Reason for Consultation: Clarification of GOC and options   Phone Number:[phone]  Assessment of patient's Current state: Pt is a 67yo female in NAD.  Pt sitting in chair, AOx4 and engaging in conversation.     Consult is for review of medical treatment options, clarification of goals of care and end of life issues, disposition and options, and symptom recommendation.  This NP Lorinda Creed reviewed medical records, received report from team, assessed the patient and then met at the patient's bedside along with her son/HCPOA, Derwood Kaplan [phone]), and his wife Lerry Paterson, to discuss diagnosis, prognosis, GOC, EOL wishes, disposition and options.   The patient has a completed a healthcare POA form and a copy was placed in the chart. The difference between a aggressive medical intervention path  and a palliative comfort care path for this patient at this time was had.  Values and goals of care important to patient and family were attempted to be elicited.  Concept of Hospice and Palliative Care were discussed  Natural trajectory and expectations at EOL were discussed.  Questions and concerns addressed. Family encouraged to call with questions or concerns.  PMT will continue to support holistically.  Goals of Care:  1.  Code Status: DNR/DNI - Continue.  Discussed with pt in detail that this means that the hospital will not attempt resuscitation in the form of CPR, defibrillation or intubation in the event of death.     2. Scope of Treatment: 1. Vital Signs: Continue as ordered for medical management 2. AICD: N/A  3. Respiratory/Oxygen: Continue as needed to maintain oxygen saturations.  Continue with CPAP/BiPAP at night or as needed.  4. Nutritional Support/Tube Feeds:  Pt eating a heart healthy diet as ordered.  Reports good appetite. 5. Antibiotics: Continue as needed to treat infection 6. Blood Products: Administer as needed to maintain hemodynamic stability 7. IVF: Continue as needed for medical management/hydration 8. Review of Medications to be discontinued: Continue medications as ordered.  See symptom management section for details on medication changes.  Pt would like to continue with treatment  per oncology recommendations. 9. Labs: Continue as needed for medical/treatment monitoring 10. Telemetry: Continue as needed for medical monitoring. 11. Consults: Chaplain consulted per pt request.  PT consulted for strength rehabilitation.   4. Disposition: Hopeful for disposition to SNF for rehabilitation with Palliative Care Services to follow    3. Symptom Management:   1. Pain: D/C Q6H scheduled morphine immediate release solution.  Ordered 15mg  MS Contin PO Q12H for basal pain relief; 5mg  morphine immediate release solution Q4HPRN for breakthrough pain.  Continue Tylenol as ordered for mild pain management.  2. Bowel Regimen: Senna S  Two tablets q hs 3. Weakness: PT/OT evaluation and tx, encouraged self AROM exercises 4. Nausea/Vomiting: Continue Zofran 4mg  IV Q6HPRN for nausea/vomiting.  4. Psychosocial:  Pt lives alone in [place], with son and daughter-in-law checking in as needed.  [facility] provides home health services.  Per son, additional family may be available to check in on patient as well.     5. Spiritual:  Chaplain consulted per above.    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       ROS: Pt endorses generalized pain, especially in back.  States current pain regimen works, but wears off prior to next dose.  Pt endorses increased SOB/orthopnea over the past several weeks, requiring at least 4 pillows at night and additional upright positioning.  Pt also has noted increased LE edema.       PMH:  Past Medical History  Diagnosis Date  . CHF (congestive heart failure)      2-D echo 02/19/2009 showed left ventricle cavity size normal; systolic function normal, estimated ejection fraction 55%; wall motion normal; no regional wall motion abnormalities; PA peak pressure 47mm Hg.  [name] COPD (chronic obstructive pulmonary disease)     Pulmonary function tests on 08/05/2010 showed mixed obstructive and restrictive lung disease with no significant response to bronchodilator, and decreased diffusion capacity that corrects to normal range when adjusted for the inhaled alveolar volume.  . Degenerative joint disease   . AV block, complete     S/P dual-chamber for symptomatic episodes of bradycardia and complete heart block.  . Pacemaker     S/P dual-chamber for symptomatic episodes of bradycardia and complete heart block.  . Adenomatous polyp of colon     Tubular adenomas X 2 found 06/1999; negative screening colonoscopy 02/13/2004 by Dr. [name].  . Hypercholesteremia   . Obstructive sleep apnea     Baseline diagnostic nocturnal polysomnogram on July 27, 2005 showed severe obstructive sleep apnea/hypopnea syndrome, with AHI 153.2 per hour.  Last nocturnal polysomnogram done for CPAP titration was 05/01/2009; CPAP was titrated to 13 CWP, AHI 1.8 per hour using a large Respironics FitLife Full Face Mask with heated humidifier.  . Peripheral autonomic neuropathy due to diabetes mellitus   . 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 [name]  . Constipation   . Hand pain    . Palpitation   . Rib pain     right sided   . DM type 2 (diabetes mellitus, type 2)   . Skin rash   . Urinary incontinence   . Proliferative diabetic retinopathy(362.02)     S/P panretinal photocoagulation by Dr. [name]  . Vitreous hemorrhage     With tractional detachment, left eye; S/P posterior vitrectomy with membrane peel by Dr. [name] 04/06/2005  . Dyspnea on exertion     Cardiac cath 06/17/2005 by Dr. [name] showed LVEF 50-55%, 30% proximal LAD stenosis, small diagonals.   . Shingles rash 03/25/11  . 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 Followed by Dr. [name].  . Breast cancer, stage 3 102/13    right hx lumpectomy 1994, bx today=invasive ductal  ca  . Allergy     thinitis  . Asthma   . Bronchitis     hx  . Cataract     b/l surgery  . DM hyperosmolarity type II   . Hypertension   . TIA (transient ischemic attack)   . Anemia   . DJD (degenerative joint disease)   . DM neuropathies   . Renal insufficiency      PSH: Past Surgical History  Procedure Laterality Date  . Mastectomy partial / lumpectomy w/ axillary lymphadenectomy  1992, 1995    S/P left lumpectomy and axillary lymph node dissection December 1992 for a T1 N0 medullary breast cancer, no lymph node involvement, treated with tamoxifen for 2 years; S/P right lumpectomy and axillary lymph node dissection August 1995 for a T1 N0 microinvasive breast cancer, treated with Arimidex for 5 years.  Patient is followed by Dr. [name] at the cancer center.  . Lung lobectomy  06/13/2001    S/P right lower lobe lobectomy 06/13/2001 by Dr. [name]  .  Pacemaker insertion  04/20/2001  . Mastectomy partial / lumpectomy    . Lobectomy    . Pacemaker insertion    . Cholecystectomy    . Tonsillectomy    . Port  a  cath     I have reviewed the FH and SH and  If appropriate update it with new information. No Known Allergies Scheduled Meds: . albuterol  2.5 mg Nebulization QID  . antiseptic oral rinse  15 mL Mouth Rinse q12n4p  . chlorhexidine  15 mL Mouth Rinse BID  . furosemide  20 mg Oral Daily  . hydrALAZINE  25 mg Oral Q8H  . insulin aspart  0-15 Units Subcutaneous TID WC  . insulin aspart  0-5 Units Subcutaneous QHS  . insulin glargine  20 Units Subcutaneous QHS  . morphine  15 mg Oral Q12H  . multivitamin with minerals  1 tablet Oral Daily  . pregabalin  200 mg Oral BID  . sodium chloride  3 mL Intravenous Q12H  . spironolactone  50 mg Oral Daily   Continuous Infusions: . sodium chloride 20 mL/hr at 06/14/12 1900  . heparin 1,700 Units/hr (06/20/12 1138)   PRN Meds:.acetaminophen, albuterol, morphine, ondansetron (ZOFRAN) IV    BP 154/71  Pulse 116  Temp(Src) 99 F (37.2 C) (Oral)  Resp 22  Ht 5\' 4"  (1.626 m)  Wt 124.7 kg (274 lb 14.6 oz)  BMI 47.17 kg/m2  SpO2 90%   PPS: 40%   Intake/Output Summary (Last 24 hours) at 06/20/12 1236 Last data filed at 06/20/12 0847  Gross per 24 hour  Intake    491 ml  Output   1300 ml  Net   -809 ml   LBM: 06/18/12                       Stool Softner: Senna s   Physical Exam:  General:  Pt is a pleasant AA female in NAD, AOx4, engaging appropriately in conversation HEENT:  Normocephalic, oral mucosa pink, moist. Chest:   Chest expansion symmetrical, lungs CTA bilaterally, diminished in RLL CVS: RRR, S1,S2, no murmurs, telemetry monitor in place Abdomen: Distended, soft, bowel sounds present.  Ext: No LE edema Neuro: AOx4, CN II-XII grossly intact  Labs: CBC    Component Value Date/Time   WBC 8.1 06/20/2012 0630   WBC 7.8 05/19/2012 1110   RBC 4.30 06/20/2012 0630    RBC 4.23 05/19/2012 1110  HGB 8.5* 06/20/2012 0630   HGB 8.3* 05/19/2012 1110   HCT 30.3* 06/20/2012 0630   HCT 28.7* 05/19/2012 1110   PLT 211 06/20/2012 0630   PLT 186 05/19/2012 1110   MCV 70.5* 06/20/2012 0630   MCV 67.9* 05/19/2012 1110   MCH 19.8* 06/20/2012 0630   MCH 19.5* 05/19/2012 1110   MCHC 28.1* 06/20/2012 0630   MCHC 28.8* 05/19/2012 1110   RDW 23.9* 06/20/2012 0630   RDW 24.3* 05/19/2012 1110   LYMPHSABS 1.4 06/20/2012 0630   LYMPHSABS 1.7 05/19/2012 1110   MONOABS 0.9 06/20/2012 0630   MONOABS 0.9 05/19/2012 1110   EOSABS 0.1 06/20/2012 0630   EOSABS 0.0 05/19/2012 1110   BASOSABS 0.0 06/20/2012 0630   BASOSABS 0.0 05/19/2012 1110    BMET    Component Value Date/Time   NA 137 06/20/2012 0630   NA 140 05/05/2012 1101   K 3.6 06/20/2012 0630   K 4.6 05/05/2012 1101   CL 98 06/20/2012 0630   CL 105 05/05/2012 1101   CO2 31 06/20/2012 0630   CO2 26 05/05/2012 1101   GLUCOSE 156* 06/20/2012 0630   GLUCOSE 104* 05/05/2012 1101   BUN 14 06/20/2012 0630   BUN 19.0 05/05/2012 1101   CREATININE 1.09 06/20/2012 0630   CREATININE 1.62* 05/24/2012 1211   CREATININE 1.1 05/05/2012 1101   CALCIUM 10.0 06/20/2012 0630   CALCIUM 8.5 05/05/2012 1101   GFRNONAA 52* 06/20/2012 0630   GFRAA 61* 06/20/2012 0630    CMP     Component Value Date/Time   NA 137 06/20/2012 0630   NA 140 05/05/2012 1101   K 3.6 06/20/2012 0630   K 4.6 05/05/2012 1101   CL 98 06/20/2012 0630   CL 105 05/05/2012 1101   CO2 31 06/20/2012 0630   CO2 26 05/05/2012 1101   GLUCOSE 156* 06/20/2012 0630   GLUCOSE 104* 05/05/2012 1101   BUN 14 06/20/2012 0630   BUN 19.0 05/05/2012 1101   CREATININE 1.09 06/20/2012 0630   CREATININE 1.62* 05/24/2012 1211   CREATININE 1.1 05/05/2012 1101   CALCIUM 10.0 06/20/2012 0630   CALCIUM 8.5 05/05/2012 1101   PROT 6.7 06/10/2012 0630   PROT 6.9 05/05/2012 1101   ALBUMIN 2.2* 06/10/2012 0630   ALBUMIN 2.5* 05/05/2012 1101   AST 17 06/10/2012 0630   AST 15 05/05/2012 1101   ALT 10 06/10/2012 0630   ALT 13 05/05/2012 1101   ALKPHOS 41  06/10/2012 0630   ALKPHOS 65 05/05/2012 1101   BILITOT 0.1* 06/10/2012 0630   BILITOT 0.20 05/05/2012 1101   GFRNONAA 52* 06/20/2012 0630   GFRAA 61* 06/20/2012 0630     Time In Time Out Total Time Spent with Patient Total Overall Time  1100 1230 90 minutes 105 minutes    Greater than 50%  of this time was spent counseling and coordinating care related to the above assessment and plan.  Lorinda Creed NP  Palliative Medicine Team Team Phone # [phone] Pager [phone]  Discussed with Dr Dalphine Handing

## 2012-06-20 NOTE — Progress Notes (Signed)
Internal Medicine Teaching Service Attending Note Date: 06/20/2012  Patient name: Melissa Villa  Medical record number: [id number]  Date of birth: 1945-08-05  I participated in the palliative care meeting with RN Corrie Dandy and the patient's family, and the patient. The patient's confusions about what palliative care is were discussed. Her questions were answered, and she appeared more satisfied. It was emphasized that palliative care did not mean "Throwing the towel" and not treating the patient, as they had assumed. The option of SNF was also discussed, and the patient seemed ready to give it a try for a week. She was also reinformed about the possible management options that Dr. [name] might have to offer to manage her cancer. She looks forward to that appointment with him.   Otherwise, the patient offered no new complaints to me. The exam was unchanged from before.   We are trying patient's home dose of lasix today and see how she does on it. Her respiratory status seems to be improving. Her AKI is improved - can be transitioned back to lovenox in preparation for discharge.   Please follow the detailed management plan in the resident note from today. I have met with my team and discussed the care of the patient with them in detail.   Aletta Edouard 06/20/2012, 1:59 PM.

## 2012-06-20 NOTE — Progress Notes (Signed)
Chaplain responded to spiritual consult for "major life transition" issued by palliative care team. Pt has become DNR. Pt has had return of cancer and has COPD. Pt is at peace about being near end of life. Pt wants to go to SNF until she regains ability to walk and then spend her final days at home and be able to spend private time with each grandchild. We had prayer together. Pt expressed appreciation for chaplain's visit.

## 2012-06-20 NOTE — Progress Notes (Signed)
ANTICOAGULATION CONSULT NOTE - Follow Up Consult  Pharmacy Consult for Heparin Indication: Hx recent RLE DVT (05/26/12)  No Known Allergies   Labs:  Recent Labs  06/18/12 0505  06/19/12 0230 06/19/12 0914 06/20/12 0630  HGB 8.0*  --  7.9*  --  8.5*  HCT 28.5*  --  28.6*  --  30.3*  PLT 214  --  198  --  211  HEPARINUNFRC 0.29*  < > 0.45 0.43 0.59  CREATININE 1.26*  --  1.27*  --  1.09  < > = values in this interval not displayed.  Estimated Creatinine Clearance: 68.1 ml/min (by C-G formula based on Cr of 1.09).   Medications:  Heparin @ 1400 units/hr (13 ml/hr)  Assessment: 67 y.o. F on heparin for recent RLE DVT (Feb '14) continues with a therapeutic heparin level (0.59). The patient is s/p chest tube for pleural effusions from 2/7-2/17 and repeat thoracentesis on 2/26 with ~370 cc of fluid removed. Hgb/Hct/Plt stable. No overt s/sx of bleeding noted. Will f/u plans to transition back to lovenox once stable.   Goal of Therapy:  Heparin level 0.3-0.7 units/ml Monitor platelets by anticoagulation protocol: Yes   Plan:  1. Continue heparin drip rate at 1700 units/hr 2. Will continue to monitor for any signs/symptoms of bleeding 3. Daily heparin level and CBC  Thank you. Okey Regal, PharmD [phone]  06/20/2012 10:38 AM

## 2012-06-21 DIAGNOSIS — I82409 Acute embolism and thrombosis of unspecified deep veins of unspecified lower extremity: Secondary | ICD-10-CM

## 2012-06-21 DIAGNOSIS — C50919 Malignant neoplasm of unspecified site of unspecified female breast: Secondary | ICD-10-CM

## 2012-06-21 LAB — BASIC METABOLIC PANEL
Calcium: 9.4 mg/dL (ref 8.4–10.5)
Chloride: 99 mEq/L (ref 96–112)
Creatinine, Ser: 1.11 mg/dL — ABNORMAL HIGH (ref 0.50–1.10)
GFR calc Af Amer: 59 mL/min — ABNORMAL LOW (ref >90)
Sodium: 138 mEq/L (ref 135–145)

## 2012-06-21 LAB — GLUCOSE, CAPILLARY
Glucose-Capillary: 172 mg/dL — ABNORMAL HIGH (ref 70–99)
Glucose-Capillary: 147 mg/dL — ABNORMAL HIGH (ref 70–99)

## 2012-06-21 MED ORDER — ACETAMINOPHEN 325 MG PO TABS: 650.0000 mg | ORAL_TABLET | Freq: Four times a day (QID) | ORAL | Status: DC | PRN

## 2012-06-21 MED ORDER — CAMPHOR-MENTHOL 0.5-0.5 % EX LOTN: 1.0000 "application " | TOPICAL_LOTION | CUTANEOUS | Status: AC | PRN

## 2012-06-21 MED ORDER — HEPARIN SOD (PORK) LOCK FLUSH 100 UNIT/ML IV SOLN
500.0000 [IU] | INTRAVENOUS | Status: AC | PRN
  Administered 2012-06-21: 500 [IU]

## 2012-06-21 MED ORDER — MORPHINE SULFATE ER 15 MG PO TBCR: 15.0000 mg | EXTENDED_RELEASE_TABLET | Freq: Two times a day (BID) | ORAL | Status: AC

## 2012-06-21 MED ORDER — MORPHINE SULFATE 10 MG/5ML PO SOLN: 5.0000 mg | ORAL | Status: DC | PRN

## 2012-06-21 MED ORDER — AMLODIPINE BESYLATE 2.5 MG PO TABS: 2.5000 mg | ORAL_TABLET | Freq: Every day | ORAL | Status: AC

## 2012-06-21 MED ORDER — LEVALBUTEROL TARTRATE 45 MCG/ACT IN AERO: 1.0000 | INHALATION_SPRAY | Freq: Four times a day (QID) | RESPIRATORY_TRACT | Status: AC

## 2012-06-21 MED ORDER — CAMPHOR-MENTHOL 0.5-0.5 % EX LOTN
1.0000 "application " | TOPICAL_LOTION | CUTANEOUS | Status: DC | PRN
  Filled 2012-06-21: qty 222

## 2012-06-21 MED ORDER — INSULIN GLARGINE 100 UNIT/ML ~~LOC~~ SOLN: 20.0000 [IU] | Freq: Every day | SUBCUTANEOUS | Status: AC

## 2012-06-21 MED ORDER — SENNOSIDES-DOCUSATE SODIUM 8.6-50 MG PO TABS: 2.0000 | ORAL_TABLET | Freq: Every day | ORAL | Status: AC

## 2012-06-21 MED ORDER — ALUM & MAG HYDROXIDE-SIMETH 200-200-20 MG/5ML PO SUSP: 30.0000 mL | ORAL | Status: DC | PRN

## 2012-06-21 MED ORDER — LEVALBUTEROL TARTRATE 45 MCG/ACT IN AERO
1.0000 | INHALATION_SPRAY | Freq: Four times a day (QID) | RESPIRATORY_TRACT | Status: DC
  Administered 2012-06-21: 2 via RESPIRATORY_TRACT
  Filled 2012-06-21: qty 15

## 2012-06-21 MED ORDER — METFORMIN HCL 850 MG PO TABS: 850.0000 mg | ORAL_TABLET | Freq: Two times a day (BID) | ORAL | Status: AC

## 2012-06-21 NOTE — Discharge Summary (Signed)
Internal Medicine Teaching Service Attending Note Date: 06/21/2012  Patient name: Melissa Villa  Medical record number: [id number]  Date of birth: 1946-02-05    I evaluated the patient briefly on the day of discharge and discussed the discharge plan with my resident team. Exam unchanged from yesterday. Patient stable. I agree with the discharge documentation and disposition by Dr. Shirlee Latch. We have arranged for close follow ups for this patient. She will be seen by her oncologist on 06/29/12 and PCP on 07/03/12 and pulmonologist on 07/13/12.    Thanks Aletta Edouard 06/21/2012, 6:30 PM

## 2012-06-21 NOTE — Progress Notes (Signed)
Pt preparing for discharge.  Her family is here to pack up her things.  Pt is excited about going to SNF to progress her mobility so she can go home.  IV team flushed port.  Pt alert and oriented, skin intact, wearing provolon boots, dressed in disposable gown, ambulance [facility] here to transport.  Report called to [facility] nurse receiving patient.  Bonney Leitz RN

## 2012-06-21 NOTE — Progress Notes (Addendum)
Subjective: Patient woke up with an am h/a again this morning (6/10).  She is agreeable to SNF.  She states she is not having abdominal pain but is having gas.  Simethicone helped yesterday.    Objective: Vital signs in last 24 hours: Filed Vitals:   06/20/12 2119 06/20/12 2205 06/21/12 0438 06/21/12 0859  BP:   137/75   Pulse:  112 121   Temp:   97.7 F (36.5 C)   TempSrc:   Axillary   Resp:  20 20   Height:      Weight:      SpO2: 95% 95% 90% 95%   Weight change:   Intake/Output Summary (Last 24 hours) at 06/21/12 1132 Last data filed at 06/21/12 0452  Gross per 24 hour  Intake 686.25 ml  Output   1750 ml  Net -1063.75 ml   General:  Lying in bed, nad, alert and oriented x 3 HEENT: [place]/at CV: tachycardic, no murmurs Lungs: wheezes b/l Abdomen: obese, soft, ntnd, normal bs  Extremities: warm no cyanosis or edema  Neuro: moving all 4 extremities, alert and oriented x 3  Skin: right breast with hyperkeratotic lesion ?previous surgery or radiation; pruritic skin tags to neck and right axilla  Lab Results: Basic Metabolic Panel:  Recent Labs Lab 06/15/12 0445  06/17/12 0500  06/18/12 0505  06/20/12 0630 06/21/12 0500  NA 138  < >  --   < > 137  < > 137 138  K 4.6  < >  --   < > 3.5  < > 3.6 3.6  CL 102  < >  --   < > 99  < > 98 99  CO2 31  < >  --   < > 34*  < > 31 33*  GLUCOSE 247*  < >  --   < > 134*  < > 156* 192*  BUN 18  < >  --   < > 16  < > 14 16  CREATININE 1.10  < >  --   < > 1.26*  < > 1.09 1.11*  CALCIUM 9.5  < >  --   < > 9.2  < > 10.0 9.4  MG 1.5  < > 1.9  --  1.6  --   --   --   PHOS 3.7  --  4.3  --   --   --   --   --   < > = values in this interval not displayed. CBC:  Recent Labs Lab 06/19/12 0230 06/20/12 0630  WBC 8.7 8.1  NEUTROABS 5.9 5.6  HGB 7.9* 8.5*  HCT 28.6* 30.3*  MCV 71.5* 70.5*  PLT 198 211   CBG:  Recent Labs Lab 06/19/12 2146 06/20/12 0808 06/20/12 1154 06/20/12 1637 06/20/12 2100 06/21/12 0737  GLUCAP 176*  153* 252* 129* 171* 172*     Studies/Results: No results found. Medications:  Scheduled Meds: . antiseptic oral rinse  15 mL Mouth Rinse q12n4p  . chlorhexidine  15 mL Mouth Rinse BID  . enoxaparin (LOVENOX) injection  120 mg Subcutaneous Q12H  . furosemide  20 mg Oral Daily  . hydrALAZINE  25 mg Oral Q8H  . insulin aspart  0-15 Units Subcutaneous TID WC  . insulin aspart  0-5 Units Subcutaneous QHS  . insulin glargine  20 Units Subcutaneous QHS  . levalbuterol  1-2 puff Inhalation Q6H  . morphine  15 mg Oral Q12H  . multivitamin with minerals  1  tablet Oral Daily  . pregabalin  200 mg Oral BID  . senna-docusate  2 tablet Oral QHS  . sodium chloride  10-40 mL Intracatheter Q12H  . sodium chloride  3 mL Intravenous Q12H  . spironolactone  50 mg Oral Daily   Continuous Infusions: . 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  Initially, she had failure on bipap on admission and was intubated 2/22 now post extubation 2/23.  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  Initially, she had failure on bipap on admission and was intubated 2/22 now post extubation 2/23.  She has also had AKI this admission.   1. Acute on chronic respiratory failure (likely multifactorial but acute due to recent recurrent pleural effusion) -respiratory status improving now on 2-3 L [place] with bipap/cpap at night -recurrent pleural effusion-CTS (Dr. Tyrone Sage) will not place pleurex cath at this time; status post thoracentesis 2/26 with few atypical cells -History of COPD on 2L at home; continue bronchodilators. Changed Albuterol neb to Xopenex due to tachycardia   2. Metastatic  cancer (primary breast cancer, h/o RLL carcinoid post lobectomy)  -Followed by Dr. [name] appt 06/29/12 who will review tx options -palliative care added MS Contin 15 mg q12.  Will d/c with morphine IR q6 -On Senna 2 tablets qhs for bowel regimen   3. Right lower extremity DVT -diagnosed 2/7 -3/4 d/c Heparin gtt now on Lovenox 120 mg bid   4. AKI -Overall improved yet waxing and waning this admission. Monitor Creatinine.   5. Diabetes history  -SSI, Lantus 20 units qhs, Metformin 850 mg bid  -monitor cbg  6. HTN history -Hydralazine 25 mg q8, Lasix 20 qd, Spironolactone 50 mg qd  -monitor BP  7. MRSA + nares  -MRSA protocol   8. F/E/N -NS KVO -monitor electrolytes  -heart healthy/carb modified diet  Code status: code status changed to DNR    Dispo: Disposition is deferred at this time, awaiting SNF for physical deconditioning then home. Medically stable for discharge  The patient does have a current PCP [name], MD), therefore will be requiring [facility] follow-up after discharge.   The patient does not have transportation limitations that hinder transportation to clinic appointments.  Services Needed at time of discharge: Y = Yes, Blank = No PT: Y  OT: Y  RN: Y  Equipment:   Other: Social work     LOS: 12 days   Annett Gula [phone] 06/21/2012, 11:32 AM

## 2012-06-21 NOTE — Progress Notes (Signed)
Placed pt on BIPAP ST via [facility], 12/6 with 2 lpm O2 bleed in. Pt. Tolerating well. RN aware.

## 2012-06-21 NOTE — Discharge Summary (Addendum)
Internal Medicine Teaching [facility] Discharge Note  Addended Code status to DNR/DNI   Name: Melissa Villa MRN: [id number] DOB: Aug 05, 1945 67 y.o.  Date of Admission: 06/09/2012 12:21 PM Date of Discharge: 06/22/2012 Attending Physician: No att. providers found  Discharge Diagnosis: 1. Acute on chronic respiratory failure likely multifactorial (most likely secondary to right pleural effusion (recurrent)) 2. 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 Right lower extremity Deep venous thrombosis  4. AKI (acute kidney injury) 5. DIABETES MELLITUS, TYPE II 6.  HYPERTENSION history  7. chronic diastolic heart failure  8. MRSA positive nares  9. OBSTRUCTIVE SLEEP APNEA on cpap 10. Chronic, COPD 11. Normocytic anemia  12. Physical deconditioning 13. Acute encephalopathy, resolved  14. Falls at home   Discharge Medications:   Medication List    STOP taking these medications       lidocaine-prilocaine cream  Commonly known as:  EMLA     metroNIDAZOLE 500 MG tablet  Commonly known as:  FLAGYL     oxyCODONE 5 MG immediate release tablet  Commonly known as:  Oxy IR/ROXICODONE     valsartan 80 MG tablet  Commonly known as:  DIOVAN      TAKE these medications       acetaminophen 325 MG tablet  Commonly known as:  TYLENOL  Take 2 tablets (650 mg total) by mouth every 6 (six) hours as needed.     albuterol 108 (90 BASE) MCG/ACT inhaler  Commonly known as:  PROAIR HFA  Inhale 2 puffs into the lungs 4 (four) times daily as needed for wheezing or shortness of breath.     albuterol (2.5 MG/3ML) 0.083% nebulizer solution  Commonly known as:  PROVENTIL  Take 2.5 mg by nebulization every 6 (six) hours.     amLODipine 2.5 MG tablet  Commonly known as:  NORVASC  Take 1 tablet (2.5 mg total) by mouth daily.     BEPREVE 1.5 % Soln  Generic drug:  Bepotastine Besilate  Place 1 drop into both eyes  2 (two) times daily.     budesonide 0.5 MG/2ML nebulizer solution  Commonly known as:  PULMICORT  Take 2 mLs (0.5 mg total) by nebulization 2 (two) times daily.     Calcium + D3 600-200 MG-UNIT Tabs  Take 1 tablet by mouth daily.     camphor-menthol lotion  Commonly known as:  SARNA  Apply 1 application topically as needed for itching.     carvedilol 12.5 MG tablet  Commonly known as:  COREG  Take 2 tablets (25 mg total) by mouth 2 (two) times daily.     cetirizine 10 MG tablet  Commonly known as:  ZYRTEC  Take 10 mg by mouth daily.     citalopram 10 MG tablet  Commonly known as:  CELEXA  Take 1 tablet (10 mg total) by mouth daily.     DEXILANT 60 MG capsule  Generic drug:  dexlansoprazole  Take 60 mg by mouth every morning. Take 15 minutes before breakfast.     enoxaparin 120 MG/0.8ML injection  Commonly known as:  LOVENOX  Inject 0.8 mLs (120 mg total) into the skin every 12 (twelve) hours.     Fluticasone-Salmeterol 250-50 MCG/DOSE Aepb  Commonly known as:  ADVAIR  Inhale 1 puff into the lungs every 12 (twelve) hours.     furosemide 20 MG tablet  Commonly known as:  LASIX  Take 1 tablet (20 mg  total) by mouth daily.     insulin glargine 100 UNIT/ML injection  Commonly known as:  LANTUS  Inject 20 Units into the skin at bedtime.     IRON PO  Take 1 tablet by mouth daily.     levalbuterol 45 MCG/ACT inhaler  Commonly known as:  XOPENEX HFA  Inhale 1-2 puffs into the lungs every 6 (six) hours.     metFORMIN 850 MG tablet  Commonly known as:  GLUCOPHAGE  Take 1 tablet (850 mg total) by mouth 2 (two) times daily with a meal.     mometasone 50 MCG/ACT nasal spray  Commonly known as:  NASONEX  Place 2 sprays into the nose daily. scheduled     morphine 15 MG 12 hr tablet  Commonly known as:  Melissa CONTIN  Take 1 tablet (15 mg total) by mouth every 12 (twelve) hours.     morphine 10 MG/5ML solution  Take 2.5 mLs (5 mg total) by mouth every 4 (four) hours as  needed.     multivitamin with minerals Tabs  Take 1 tablet by mouth daily.     pregabalin 200 MG capsule  Commonly known as:  LYRICA  Take 1 capsule (200 mg total) by mouth 2 (two) times daily.     rosuvastatin 10 MG tablet  Commonly known as:  CRESTOR  Take 10 mg by mouth daily.     senna-docusate 8.6-50 MG per tablet  Commonly known as:  Senokot-S  Take 2 tablets by mouth at bedtime.     tiotropium 18 MCG inhalation capsule  Commonly known as:  SPIRIVA  Place 1 capsule (18 mcg total) into inhaler and inhale daily.     traMADol 50 MG tablet  Commonly known as:  ULTRAM  Take 50 mg by mouth every 6 (six) hours as needed. For pain        Disposition and follow-up:   Melissa Villa was discharged from [facility] in stable condition.  At the hospital follow up visit please address  1) Be cautious when oxygen saturation is low as pain medications can make worse  2) Repeat BMET, CBC in 1 week  3) Oncology Discuss further cancer treatment 4) Address pain control  5) If tachycardia continues hold Albuterol and try Xopenex inhaler  6) history of OSA on cpap at home though patient states bipap helps breathing. Outpatient she may need her settings adjusted 7) If needed resume Diovan for Hypertension and increase Norvasc 8) Pain per Palliative care Q6H scheduled morphine immediate release solution. Ordered 15mg  Melissa Contin PO Q12H for basal pain relief; 5mg  morphine immediate release solution Q4HPRN for breakthrough pain. Continue Tylenol as ordered for mild pain management. Bowel Regimen: Senna S Two tablets q hs 9) Titrate Lantus dose up if needed 10) Diet carb modified; heart healthy 11) Pulmonary recommend best bronchodilator treatment for patient with tachycardia and lung disease outpatient thanks.     Follow-up Appointments:  Discharge Orders   Future Appointments Tamikka Pilger Department Dept Phone   06/29/2012 10:15 AM Krista Blue [facility] MEDICAL ONCOLOGY [phone]   06/29/2012 10:45 AM Amy Allegra Grana, PA-C [facility] [phone]   06/29/2012 11:15 AM [facility] [phone]   07/03/2012 9:15 AM Darden Palmer, MD [facility] INTER[facility] MEDICINE [facility] [phone]   07/13/2012 10:30 AM Waymon Budge, MD [facility] [phone]   07/27/2012 10:30 AM [facility] Nurse  [place] [facility] [phone]   08/24/2012 10:30 AM [facility] Nurse [place] [facility] [phone]   09/21/2012 10:30 AM [facility] Nurse [facility] [facility] [phone]   Future Orders Complete By Expires     Discharge instructions  As directed     Comments:      1) Follow up with Cancer Center 06/29/12 at 10:15 am [phone] 2) Follow up with Internal Medicine clinic 07/03/12 at 9:15 am [phone] 3) Follow up with Dr. Maple Hudson 07/13/12 Pulmonology at 10:30 am [phone]       Consultations:  1. Palliative [facility]-Dr. Phillips Odor, NP Gailen Shelter 2. [facility]-Dr. Bard Herbert, Dr. Tyson Alias, Dr. Marchelle Gearing 3. C[facility]-Dr. Lyn Henri   Procedures Performed:  Dg Chest 2 View  06/19/2012  *RADIOLOGY REPORT*  Clinical Data: Weakness.  Short of breath.  Follow-up pleural effusion.  CHEST - 2 VIEW  Comparison: 06/17/2012  Findings: Pacemaker remains in place.  Power port remains in place with its tip at the SVC/RA junction.  There is less pleural fluid on the right than seen previously with less basilar atelectasis. In general, there does appear to be pulmonary venous hypertension without frank edema.  IMPRESSION: Less pleural fluid and atelectasis on the right.  Persistent pulmonary venous hypertension.   Original Report Authenticated By: Paulina Fusi, M.D.    Dg Chest 2 View  06/09/2012  *RADIOLOGY REPORT*  Clinical Data: Altered mental status.  CHEST - 2 VIEW  Comparison: One-view chest 06/05/2012.   Findings: The Port-A-Cath and pacing wires are stable.  The right pleural effusion is reaccumulating following removal of the chest tube.  There appears to be loculated component.  Right basilar airspace disease is present.  While this may represent atelectasis, infection is not excluded.  Mild pulmonary vascular congestion and cardiac enlargement are stable.  IMPRESSION:  1.  Reaccumulating right pleural effusion.  There may be a loculated component. 2.  Worsening right lower lobe airspace disease.  Infection is not excluded. 3.  Stable pulmonary vascular congestion.   Original Report Authenticated By: Marin Roberts, M.D.    Dg Chest 2 View  06/05/2012  *RADIOLOGY REPORT*  Clinical Data: 67 year old female shortness of breath.  Right chest tube.  CHEST - 2 VIEW  Comparison: 06/02/2012 and earlier.  Findings: AP and lateral views of the chest.  Right pigtail pleural drain remains in place.  No pneumothorax identified.  Small volume residual right pleural effusion.  Residual right perihilar opacity. Left lung is stable and clear.  Stable cardiac size and mediastinal contours.  Visualized tracheal air column is within normal limits. Left chest Port-A-Cath and right chest cardiac pacemaker. Postoperative changes to the left axilla.  Stable visualized osseous structures.  IMPRESSION: 1.  Stable right pigtail pleural drain with small residual right pleural effusion. No pneumothorax. 2.  Continued right perihilar opacity.   Original Report Authenticated By: Erskine Speed, M.D.    Dg Chest 2 View  05/24/2012  *RADIOLOGY REPORT*  Clinical Data: Shortness of breath.  CHEST - 2 VIEW  Comparison: 05/18/2012.  Findings: The power port is stable.  The pacer wires are unchanged. The heart is enlarged but stable.  Mediastinal and hilar contours are prominent but unchanged.  There is a persistent moderate-sized right pleural effusion and underlying pulmonary edema.  No pneumothorax.  IMPRESSION: CHF with moderate sized  right pleural effusion.   Original Report Authenticated By: Rudie Meyer, M.D.    Ct Head Wo Contrast  06/09/2012  *RADIOLOGY  REPORT*  Clinical Data:  Altered mental status  CT HEAD WITHOUT CONTRAST CT CERVICAL SPINE WITHOUT CONTRAST  Technique:  Multidetector CT imaging of the head and cervical spine was performed following the standard protocol without intravenous contrast.  Multiplanar CT image reconstructions of the cervical spine were also generated.  Comparison:  03/28/2012  CT HEAD  Findings: No skull fracture is noted.  Paranasal sinuses and mastoid air cells are unremarkable.  Mild cerebral atrophy is stable.  No intracranial hemorrhage, mass effect or midline shift.  No acute infarction.  No mass lesion is noted on this unenhanced scan.  IMPRESSION: No acute intracranial abnormality.  Mild cerebral atrophy.  CT CERVICAL SPINE  Findings: Axial images of the cervical spine shows no acute fracture or subluxation. Computer processed images shows no acute fracture or subluxation.  Mild degenerative changes C1-C2 articulation.  Mild anterior spurring lower endplate of the C4-C5 and C6 vertebral body.  Moderate anterior spurring lower endplate of the C7 vertebral body.  Mild disc space flattening at C5-C6 level.  There is sclerosis  and mixed lytic appearance of the C6 vertebral body.  This is highly suspicious for metastatic disease. Further evaluation with MRI is recommended.  Images of the lung apices shows partially loculated right pleural effusion.  Right apical scarring is noted.  No diagnostic pneumothorax.  IMPRESSION:  1.  No acute fracture or subluxation.  Degenerative changes as described above. 2.  Mixed lytic sclerotic appearance of the C6 vertebral body. This is highly suspicious for metastatic disease.  Further evaluation with MRI is recommended. 3.  Partially visualized loculated right pleural effusion.   Original Report Authenticated By: Natasha Mead, M.D.    Ct Chest Wo Contrast  06/12/2012   *RADIOLOGY REPORT*  Clinical Data: Right pleural effusion.  Shortness of breath.  CT CHEST WITHOUT CONTRAST  Technique:  Multidetector CT imaging of the chest was performed following the standard protocol without IV contrast.  Comparison: Chest CT 05/25/2012.  Findings:  Mediastinum: Heart size is mildly enlarged. Small amount of pericardial fluid and/or thickening, unchanged and unlikely to be of hemodynamic significance at this time.  Left-sided pacemaker device in place with lead tips terminating in the right atrium and right ventricular apex. There is atherosclerosis of the thoracic aorta, the great vessels of the mediastinum and the coronary arteries, including calcified atherosclerotic plaque in the left main, left anterior descending, left circumflex and right coronary arteries. Numerous reactive sized mediastinal and hilar lymph nodes are noted. Please note that accurate exclusion of hilar adenopathy is limited on noncontrast CT scans.   Esophagus is unremarkable in appearance.  Lungs/Pleura: A complex multilocular right-sided pleural effusion, moderate to large in size, similar to prior examination.  Extensive thickening of the peribronchovascular interstitium throughout the right lung, particularly in the perihilar region.  Improvement in atelectasis in the left lower lobe noted on the prior examination. No new consolidative airspace disease.  Upper Abdomen: 3.3 x 2.4 cm ill-defined low attenuation lesion in the left lobe of the liver is incompletely characterized on this noncontrast CT examination.  This appears to be new compared to prior examinations.  Bilateral perinephric stranding.  Extensive atherosclerosis.  Musculoskeletal: Old healed fracture of the lateral aspect of the left fifth rib and the lateral aspect of the right third and seventh ribs.  A small sclerotic lesion in the lateral aspect of the right second rib is favored to represent a bone island. Multiple other less well defined sclerotic  lesions are noted throughout the  thoracic spine, most notably in the posterolateral aspect of the T7 vertebral body on the right extending toward the pedicle.  Surgical clips in the axillary regions bilaterally, compatible with prior nodal dissection.  IMPRESSION: 1.  Complex multilocular right-sided pleural effusion is very similar in size to the prior examination. 2.  Extensive peribronchovascular interstitial thickening throughout the right lung, most pronounced in the right perihilar region.  The appearance is unusual and could suggest the presence of lymphangitic spread of malignancy.  Clinical correlation is recommended, with consideration for further evaluation with PET CT to exclude malignancy if clinically indicated. 3.  At time of follow-up imaging, attention to the multiple sclerotic lesions in the spine is recommended, as sclerotic metastases could have a similar appearance. 4.  New low attenuation lesion in the left lobe of the liver measuring 3.4 x 2.4 cm.  If there is an underlying malignancy, this could certainly represent a metastatic lesion.  Alternatively, clinical correlation for signs and symptoms of infection may be warranted.  This could be further evaluated with dedicated abdominal CT scan with contrast. 5.  Additional incidental findings, similar to prior studies, as above.   Original Report Authenticated By: Trudie Reed, M.D.    Ct Chest Wo Contrast  05/26/2012  *RADIOLOGY REPORT*  Clinical Data: Right-sided airspace disease and pleural effusion. History of breast cancer, right lower lobectomy, renal failure, right lung carcinoid.  CT CHEST WITHOUT CONTRAST  Technique:  Multidetector CT imaging of the chest was performed following the standard protocol without IV contrast.  Comparison: 04/01/2012  Findings: Diffuse cardiac enlargement.  Calcification in the coronary arteries, aorta, and aortic valve.  Pericardial effusion. Moderate sized right pleural effusion with diffuse  atelectasis. Partial aeration of the right lower lung. Small left pleural effusion with basilar atelectasis.  Visualization of the lungs is limited due to respiratory motion artifact and atelectasis. The pleural effusions and atelectatic changes have progressed since the previous study.  No underlying mass lesion is specifically identified, but masses could easily be obscured by the overlying pulmonary parenchymal processes and effusions.  Enteric endotracheal tubes are present.  Diffuse enlargement of the thyroid gland.  Surgical clips in the axillary regions bilaterally.  Old bilateral rib fractures possibly due to thoracotomy.  Cardiac pacemaker with generator pack in the right upper chest.  Limited visualization of the upper abdominal organs.  Diffuse degenerative changes throughout the thoracic spine.  Focal areas of vague sclerosis in the mid thoracic vertebrae are nonspecific but sclerotic metastases are not excluded.  IMPRESSION: Moderate sized right and small left pleural effusions with pulmonary atelectasis bilaterally.  Effusions have increased and aeration has decreased since previous studies.  Persistent cardiac enlargement of pericardial effusion.  Nonspecific foci of sclerosis and mid thoracic vertebra.  Sclerotic metastasis is not excluded.   Original Report Authenticated By: Burman Nieves, M.D.    Ct Cervical Spine Wo Contrast  06/09/2012  *RADIOLOGY REPORT*  Clinical Data:  Altered mental status  CT HEAD WITHOUT CONTRAST CT CERVICAL SPINE WITHOUT CONTRAST  Technique:  Multidetector CT imaging of the head and cervical spine was performed following the standard protocol without intravenous contrast.  Multiplanar CT image reconstructions of the cervical spine were also generated.  Comparison:  03/28/2012  CT HEAD  Findings: No skull fracture is noted.  Paranasal sinuses and mastoid air cells are unremarkable.  Mild cerebral atrophy is stable.  No intracranial hemorrhage, mass effect or midline  shift.  No acute infarction.  No mass lesion is noted on this  unenhanced scan.  IMPRESSION: No acute intracranial abnormality.  Mild cerebral atrophy.  CT CERVICAL SPINE  Findings: Axial images of the cervical spine shows no acute fracture or subluxation. Computer processed images shows no acute fracture or subluxation.  Mild degenerative changes C1-C2 articulation.  Mild anterior spurring lower endplate of the C4-C5 and C6 vertebral body.  Moderate anterior spurring lower endplate of the C7 vertebral body.  Mild disc space flattening at C5-C6 level.  There is sclerosis  and mixed lytic appearance of the C6 vertebral body.  This is highly suspicious for metastatic disease. Further evaluation with MRI is recommended.  Images of the lung apices shows partially loculated right pleural effusion.  Right apical scarring is noted.  No diagnostic pneumothorax.  IMPRESSION:  1.  No acute fracture or subluxation.  Degenerative changes as described above. 2.  Mixed lytic sclerotic appearance of the C6 vertebral body. This is highly suspicious for metastatic disease.  Further evaluation with MRI is recommended. 3.  Partially visualized loculated right pleural effusion.   Original Report Authenticated By: Natasha Mead, M.D.    [place] Chest  05/25/2012  *RADIOLOGY REPORT*  Clinical Data: Shortness of breath, congestive heart failure, pulmonary edema, right-sided pleural effusion  CHEST ULTRASOUND  Comparison: None.  Findings: Limited ultrasound was performed of the right chest for the purposes of possible thoracentesis.  Ultrasound imaging reveals a small effusion, but this is not felt amendable to percutaneous thoracentesis.  No procedure performed  IMPRESSION: Limited ultrasound of chest finds small pleural effusion not amenable for thoracentesis  Read by Brayton El PA-C   Original Report Authenticated By: Richarda Overlie, M.D.    US Renal Port  06/10/2012  *RADIOLOGY REPORT*  Clinical Data: Assess for hydronephrosis.  Renal  insufficiency.  RENAL/URINARY TRACT ULTRASOUND COMPLETE  Comparison:  Abdominal CT 08/06/2009  Findings:  Right Kidney:  Normal appearance of the right kidney without hydronephrosis.  Right kidney measures 10.9 cm length.  Left Kidney:  Left kidney measures 14.6 cm in length without hydronephrosis.  Bladder:  Urinary bladder is decompressed with a catheter.  IMPRESSION: Negative for hydronephrosis.   Original Report Authenticated By: Richarda Overlie, M.D.    Dg Chest Port 1 View  06/17/2012  *RADIOLOGY REPORT*  Clinical Data: Pleural effusion  PORTABLE CHEST - 1 VIEW  Comparison: 06/16/2012; 06/14/2012; 06/12/2012; chest CT - 06/12/2012; ultrasound-guided right-sided thoracentesis - 06/14/2012  Findings:  Grossly unchanged cardiac silhouette and mediastinal contours. Stable position of support apparatus.  Interval increase in now likely moderate sized, likely partially loculated, right-sided pleural effusion with associated increased right basilar heterogeneous / consolidative opacity.  Persistent cephalization of flow.  Grossly unchanged left basilar opacity.  Unchanged bilateral axillary surgical clips.  No pneumothorax.  Unchanged bones.  IMPRESSION: Interval increase in now likely moderate sized, likely partially loculated, right-sided pleural effusion and associated adjacent right basilar opacities favored to represent atelectasis.   Original Report Authenticated By: Tacey Ruiz, MD    Dg Chest [facility]  06/16/2012  *RADIOLOGY REPORT*  Clinical Data: Respiratory failure, recent right thoracentesis  PORTABLE CHEST - 1 VIEW  Comparison: Portable chest x-ray of 06/14/2012  Findings: There is little change in aeration.  Airspace disease bilaterally right greater than left is stable and there may be a slight increase in volume right pleural effusion.  No pneumothorax is noted.  Mild cardiomegaly stable.  A permanent pacemaker and power port Port-A-Cath remain.  IMPRESSION: Little change in aeration and airspace  disease, with possible slight increase  in volume of the right pleural effusion.   Original Report Authenticated By: Dwyane Dee, M.D.    Dg Chest Port 1 View  06/14/2012  *RADIOLOGY REPORT*  Clinical Data: Status post thoracentesis.  PORTABLE CHEST - 1 VIEW  Comparison: Chest x-ray 06/12/2012.  Findings: The right lateral pleural effusion is decreased. Persistent airspace disease is noted.  A smaller right pleural effusion is present.  Pacing wires and left IJ Port-A-Cath are stable.  Moderate pulmonary vascular congestion is unchanged.  Mild basilar atelectasis at the left base is improved.  IMPRESSION:  1.  Status post right thoracentesis without evidence for pneumothorax. 2.  Decreased right pleural effusion. 3.  Persistent small right pleural effusion and lower lobe airspace disease.  While this may represent atelectasis, infection is not excluded. 4.  Similar pattern of mild pulmonary vascular congestion.   Original Report Authenticated By: Marin Roberts, M.D.    Dg Chest Port 1 View  06/12/2012  *RADIOLOGY REPORT*  Clinical Data: Shortness breath.  Pleural effusion.  PORTABLE CHEST - 1 VIEW  Comparison: 06/11/2012  Findings: Interval removal of endotracheal tube and nasogastric tube noted.  Left-sided Port-A-Cath and transvenous pacemaker remain in appropriate position.  Increased right lung volume loss noted.  A persistent small right pleural effusion is seen.  Increased atelectasis or consolidation noted in both the right upper lobe and right perihilar region. Heart size remains stable.  IMPRESSION: Increased right lung volume loss following extubation.  Increased atelectasis or consolidation in the right upper lobe and right perihilar region, with persistent small right pleural effusion.   Original Report Authenticated By: Myles Rosenthal, M.D.    Dg Chest Port 1 View  06/11/2012  *RADIOLOGY REPORT*  Clinical Data: Evaluate endotracheal tube.CHF.  COPD.  Diabetes.  PORTABLE CHEST - 1 VIEW   Comparison: 1 day prior  Findings: Endotracheal tube 3.2 cm above carina.  Pacer leads right atrium right ventricle.  Nasogastric extends beyond the  inferior aspect of the film.  Left-sided Port-A-Cath terminating at the low SVC.  Borderline cardiomegaly.  Moderate hemidiaphragm elevation. Improved right-sided pleural fluid with decreased loculation laterally. No pneumothorax.  Mild pulmonary venous congestion. Persistent patchy right-sided airspace disease.  IMPRESSION: Decreased right-sided pleural fluid with loculation laterally.  Improved right-sided aeration with persistent airspace disease and volume loss remaining.  Mild pulmonary venous congestion.   Original Report Authenticated By: Jeronimo Greaves, M.D.    Dg Chest Port 1 View  06/10/2012  *RADIOLOGY REPORT*  Clinical Data: The endotracheal tube placement.  PORTABLE CHEST - 1 VIEW  Comparison: 06/09/2012  Findings: Interval placement of an endotracheal tube with tip measuring 3.1 cm above the carina.  Otherwise, there is no significant change.  Power port type left central venous catheter remains in place.  Cardiac pacemaker is unchanged.  Shallow inspiration with elevation of the right hemidiaphragm.  Small right pleural effusion with fluid in the minor fissure versus loculation. Cardiac enlargement.  Normal pulmonary vascularity.  Old right rib fractures.  Surgical clips in the axilla bilaterally.  IMPRESSION: Endotracheal tube placed with tip measuring 3.1 cm above the carina.  Otherwise no change.   Original Report Authenticated By: Burman Nieves, M.D.    Dg Chest Port 1 View  06/05/2012  *RADIOLOGY REPORT*  Clinical Data: Chest tube removal  PORTABLE CHEST - 1 VIEW  Comparison: 06/05/2012  Findings: Right sided chest tube has been removed.  No pneumothorax.  Small right pleural effusion and right lung infiltrate are unchanged.  Left lung remains clear  IMPRESSION: No pneumothorax following right chest tube removal.   Original Report Authenticated  By: Janeece Riggers, M.D.    Dg Chest Port 1 View  06/02/2012  *RADIOLOGY REPORT*  Clinical Data: Right chest tube  PORTABLE CHEST - 1 VIEW  Comparison: Yesterday  Findings: Left subclavian vein Port-A-Cath, right subclavian dual lead pacemaker and leads, right internal jugular central venous catheter, and right chest tube are stable.  There is no pneumothorax.  Volume loss in the right lung with central and basilar dense opacity are stable.    Diffuse edema has improved.  IMPRESSION: Improved edema.  No pneumothorax.  Stable chest tube.   Original Report Authenticated By: Jolaine Click, M.D.    Dg Chest Port 1 View  06/01/2012  *RADIOLOGY REPORT*  Clinical Data: Right chest tube  PORTABLE CHEST - 1 VIEW  Comparison: 05/31/2012  Findings: Cardiomediastinal silhouette is stable.  Dual lead cardiac pacemaker is unchanged in position.  Stable left IJ Port-A- Cath position.  Stable right pigtail catheter position.  Stable right IJ central line position. Again noted mild interstitial edema without change in aeration. Stable basilar atelectasis or infiltrate.  IMPRESSION: Stable right pigtail catheter position.  Persistent mild congestion/edema.  No diagnostic pneumothorax.   Original Report Authenticated By: Natasha Mead, M.D.    Dg Chest Port 1 View  05/31/2012  *RADIOLOGY REPORT*  Clinical Data: Evaluate right effusion and chest tube  PORTABLE CHEST - 1 VIEW  Comparison: Portable chest x-ray of 05/30/2012  Findings: The lungs are poorly aerated and there is pulmonary vascular congestion present.  Opacity at the right lung base is most consistent with atelectasis and effusion.  Right chest tube, permanent pacemaker, right IJ central venous line, and Port-A-Cath remain.  Cardiomegaly is stable.  IMPRESSION: Slightly diminished aeration with little change in probable edema.   Original Report Authenticated By: Dwyane Dee, M.D.    Dg Chest Port 1 View  05/30/2012  *RADIOLOGY REPORT*  Clinical Data: Right effusion.   Respiratory distress.  PORTABLE CHEST - 1 VIEW  Comparison: 05/29/2012  Findings: Interval removal of endotracheal tube and NG tube.  Right chest pigtail catheter and left Port-A-Cath remain in place, unchanged.  No pneumothorax.  Bilateral airspace disease, right greater than left with small right effusion.  Stable cardiomegaly. No real change since prior study.  IMPRESSION: Interval extubation.  Otherwise no change.  No pneumothorax.   Original Report Authenticated By: Charlett Nose, M.D.    Dg Chest Port 1 View  05/29/2012  *RADIOLOGY REPORT*  Clinical Data: Pneumonia  PORTABLE CHEST - 1 VIEW  Comparison: Yesterday  Findings: Borderline cardiomegaly.  Bilateral pulmonary edema improved.  Right pleural effusion improved.  Right chest tube remains in place.  Right internal jugular vein central venous catheter and left internal jugular vein Port-A-Cath unchanged. Endotracheal and NG tubes stable.  No pneumothorax.  IMPRESSION: Improved edema.  Improved right pleural effusion.   Original Report Authenticated By: Jolaine Click, M.D.    Dg Chest Port 1 View  05/28/2012  *RADIOLOGY REPORT*  Clinical Data: Check endotracheal tube.  Right effusion.  Chest tube.  PORTABLE CHEST - 1 VIEW  Comparison: 05/27/2012  Findings: The patient is rotated towards the right, limiting the study.  Shallow inspiration.  Right pleural effusion with atelectasis or consolidation in the right mid lung.  Right chest tube is in place.  Right central venous catheter, left central venous catheter, endotracheal tube, and enteric tubes are not changed in position. Perihilar infiltration on the left likely represents  edema.  No pneumothorax.  Surgical clips in the axilla bilaterally.  Cardiac pacemaker.  IMPRESSION: No significant change since yesterday.  Appliances appear to be stable in position, allowing for patient rotation.  Persistent right pleural effusion and consolidation in the right lung. Perihilar infiltration or edema in the left lung.    Original Report Authenticated By: Burman Nieves, M.D.    Dg Chest Port 1 View  05/27/2012  *RADIOLOGY REPORT*  Clinical Data: 67 year old female pneumonia pleural effusion.  PORTABLE CHEST - 1 VIEW  Comparison: Chest CT 05/26/2012 and earlier.  Findings: Semi upright AP view at 0605 hours.  Pigtail right pleural drain now in place.  Interval decreased right pleural effusion and mild to moderately improved right lung ventilation.  Stable endotracheal tube, visualized enteric tube and left chest Port-A-Cath.  Stable right chest cardiac pacemaker.  Stable cardiac size and mediastinal contours.  Decreased vascular congestion.  No confluent opacity in the left lung.  No pneumothorax identified.  IMPRESSION: 1.  Right pleural drain placed with decreased pleural effusion and mild to moderately improved right lung ventilation.  Residual fluid with no pneumothorax identified. 2.  Otherwise stable lines and tubes. 3.  Decreased pulmonary vascular congestion.   Original Report Authenticated By: Erskine Speed, M.D.    Dg Chest Port 1 View  05/26/2012  *RADIOLOGY REPORT*  Clinical Data: Right pleural effusion.  PORTABLE CHEST - 1 VIEW  Comparison: 05/25/2012  Findings: Endotracheal tube, central venous catheter, and Port-A- Cath are in place.  Dual lead pacer in place.  There is increased pulmonary vascular congestion with slight left perihilar pulmonary edema.  Large loculated right pleural effusion has increased.  Most of the right lung is compressed.  IMPRESSION:  1.  New pulmonary vascular congestion on the left with new slight perihilar edema. 2.  Increased right effusion.   Original Report Authenticated By: Francene Boyers, M.D.    Dg Chest Port 1 View  05/25/2012  *RADIOLOGY REPORT*  Clinical Data: Endotracheal tube and central line placement  PORTABLE CHEST - 1 VIEW  Comparison: 05/24/2012; 05/18/2012; 05/16/2012  Findings:  Grossly unchanged enlarged cardiac silhouette and mediastinal contours.  Interval  intubation with endotracheal tube overlying tracheal air column with tip regional to the level of the carina.  Interval placement of right jugular approach of the venous catheter with tip seen at least to the level of the mid SVC.  Otherwise, stable positioning of remaining support apparatus.  Interval increase in now moderate to large partially loculated right-sided pleural effusion and adjacent opacities within the right lung.  Pulmonary vasculature is indistinct.  Right axillary surgical clips.  Grossly unchanged bones.  IMPRESSION: 1.  Endotracheal tube overlies tracheal air column the tip regional to the carina.  Retraction approximately 2 cm is recommended. 2.  Right jugular approach central venous catheter tip projects over at least the mid SVC.  No pneumothorax. 3.  Interval increase in moderate to large likely loculated right- sided pleural effusion and adjacent opacities within the right lung, atelectasis versus infiltrate.  4.  Suspected worsening pulmonary edema.  This was made a call report.   Original Report Authenticated By: Tacey Ruiz, MD    Ct Perc Pleural Drain W/indwell Cath W/img Guide  05/26/2012  *RADIOLOGY REPORT*  Indication: Increasing right-sided pleural effusion, inability to wean from ventilator  CT GUIDED RIGHT SIDED PLEURAL DRAINAGE CATHETER PLACEMENT  Comparison: Chest CT - 05/25/2012; chest radiograph - 05/26/2012; 05/25/2012; 05/24/2012; chest ultrasound - 05/25/2012  Medications: Fentanyl 50 mcg  IV; Versed 2 mg IV  Total Moderate Sedation time: 24 minutes  Contrast: None  Complications: None immediate  Technique / Findings:  Informed written consent was obtained from the patient's family after a discussion of the risks, benefits and alternatives to treatment.  The patient was placed supine on the CT gantry and a pre procedural CT was performed re-demonstrating the known small to moderate-sized right-sided pleural effusion.  The procedure was planned.   A timeout was performed  prior to the initiation of the procedure.  The right anterior chest was prepped and draped in the usual sterile fashion.   The overlying soft tissues were anesthetized with 1% lidocaine with epinephrine.  Appropriate trajectory was planned with the use of a 22 gauge spinal needle.  An 18 gauge trocar needle was advanced into the right pleural space a short Amplatz super stiff wire was coiled within the collection. Appropriate positioning was confirmed with a limited CT scan.  The tract was serially dilated allowing placement of a 10 [place] all- purpose drainage catheter.  Appropriate positioning was confirmed with a limited postprocedural CT scan with the drainage catheter directed towards the right lung apex.  Approximately 400 ml of simple, serous fluid was aspirated from the right pleural space.  The tube was connected to a Pleur-vac and sutured in place.  A dressing was placed.  The patient tolerated the procedure well without immediate post procedural complication.  Impression:  Successful CT guided placement of a 10 [place] all purpose drain catheter into the right pleural space with aspiration of 400 mL of serious, simple appearing pleural fluid.  Samples were sent to the laboratory as requested by the ordering clinical team.   Original Report Authenticated By: Tacey Ruiz, MD    US Thoracentesis Asp Pleural Space W/img Guide  06/16/2012  *RADIOLOGY REPORT*  Clinical data: Breast carcinoma with loculated pleural effusion post chest tube removal, respiratory distress.  THORACENTESIS WITH ULTRASOUND  Technique:  Survey ultrasound of the right hemithorax was performed and an appropriate skin entry site was localized.  Site was marked, prepped with Betadine, draped in usual sterile fashion, infiltrated locally with 1% lidocaine. The 5-French Yueh sheath needle was advanced into the pleural space.  Clear amber fluid returned.  370 ml was removed. Postprocedure imaging shows decompression of the the visualized  collection. The patient tolerated procedure well, with no immediate complication.  IMPRESSION  Technically successful ultrasound-guided right thoracentesis. Follow-up chest radiograph pending.   Original Report Authenticated By: D. Andria Rhein, MD     2D Echo:  05/25/12 echo  Study Conclusions  - Left ventricle: The cavity size was normal. Wall thickness was normal. Systolic function was normal. The estimated ejection fraction was in the range of 60% to 65%. Wall motion was normal; there were no regional wall motion abnormalities. Doppler parameters are consistent with abnormal left ventricular relaxation (grade 1 diastolic dysfunction). - Ventricular septum: The contour showed diastolic flattening and systolic flattening. - Left atrium: The atrium was mildly dilated. - Right ventricle: The cavity size was moderately dilated. Systolic function was severely reduced. - Right atrium: The atrium was moderately dilated. - Atrial septum: The septum bowed from right to left, consistent with increased right atrial pressure. - Tricuspid valve: Moderate regurgitation. - Pulmonary arteries: Systolic pressure was severely increased. PA peak pressure: Hg (S). - Pericardium, extracardiac: A trivial pericardial effusion was identified.   Admission HPI:  Chief Complaint: altered mental status, respiratory distress  History of Present Illness:  67 year old  female with PMH of acute on chronic respiratory failure, chronic diastolic CHF, history of right invasive ductal breast cancer, carcinoid of the lung, chronic COPD (on 2-3 L home O2), hypertension, diabetes, OSA, pleural effusion, right DVT (05/2012), and C. Difficile (05/2012) presents with altered mental status after falling in her bathroom last night hitting the back of her head. She lives alone and the fall was not witnessed. This morning her daughter found her difficult to arouse, and she was brought into the emergency room by EMS. Once in  the ED she was drowsy but arousable and alert and oriented. She did complain of headaches in the ED. ABG was obtained showed a pH of 7.3, PCO2 53.2, a PO2 of 54, bicarbonate 26.2, and O2 saturation 84%. She's placed on 3 L O2 with an oxygen saturation of 92% per to monitor. She denies any increased shortness of breath, chest pain, nausea, vomiting, abdominal pain, or neck pain, per ED MD. She has had declining health since 02/2012 and weakness in her lower extremities since being hospitalized multiple times and has to use a walker at home.  She was trying to use her walker when she fell recently.   Of note, she was recently discharged the hospital on 2/17 after being admitted on 2/5 with acute on chronic respiratory failure, a loculated right pleural effusion, status post chest tube placement and removal by IR, AKI, C. Difficile, right leg DVT, and was treated with antibiotics do to concerns for possible HCAP.   Review of Systems:  A 10 point ROS was performed; pertinent positives and negatives were noted in the HPI. Pt is difficult to arouse. Information in primarily form ED physician and medical records.   Physical Exam:  Blood pressure 121/46, pulse 86, temperature 97.8 F (36.6 C), temperature source Oral, resp. rate 19, SpO2 100.00%.  General: Drowsy but somewhat arousable. Head: Normocephalic and atraumatic. C-collar in place.  Eyes: Pupils equal, round, and reactive to light. Pupils pin point Lungs: Diffusely course, respiratory distress, using accessory muscles, on 3L nasal canula Heart: Regular rate, regular rhythm, no murmur, no gallop, and no rub.  Abdomen: Obese, soft, no guarding, no rebound tenderness, no organomegaly.  Msk: No joint swelling, warmth, or erythema.  Extremities: 2+ radial and DP pulses bilaterally. 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  She presented to [facility] on 2/[facility] after multiple falls at home, increasing shortness of breath with worsening recurrent right pleural effusion. She was placed on BiPAP for support with failure and intubated 2/22-2/23.  Overall she has had a decline in health since 02/2012 and was admitted 06/09/12 with acute encephalopathy, post multiple falls at home, acute on chronic respiratory failure (mulifactorial) likely due to recurrent right pleural effusion. She has also had acute respiratory failure this admission.    1. Acute on chronic respiratory failure likely multifactorial (most likely secondary to right pleural effusion (recurrent)) On admission initially concerned for health care associated  pneumonia given Vancomycin and Zosyn 2/21 x 1 days. She has been intubated at least twice this month (05/2012) and was intubated this admission 2/22-2/23/14.  The etiology of her respiratory failure is likely multifactorial but acutely due to recent recurrent pleural effusion (right).  She previously had a chest tube 2/7 to 2/17 with reaccumulation of fluid on admission 2/21.  She failed bipap on admission 2/21 then was intubated 2/22-2/23.  She has a history of COPD on 2L at home.  She has a history of OSA on cpap at home though patient states bipap helps breathing. Outpatient she may need her setting adjusted.  Cardiothoracic surgery was consulted (Dr. Tyrone Sage) for VATS or pleurex catheter this admission which we will consider if her  pleural effusion reaccumulates but not at this time.  Repeat CXR after thoracentesis shows less right pleural effusion fluid.  Pleural fluid 05/31/12 - cytology negative for malignant cells but positive for inflammation.  She is  status post thoracentesis 2/26 with few atypical cells but not quantitatively diagnostic of malignancy.  Her respiratory status improved by discharge and she was on nasal cannula home dose of 2-3L of oxygen with use of cpap at night.  On day of discharge we discontinued her scheduled albuterol nebulizer in setting of tachycardia replaced with Xopenex inhaler.  She is saturating well though continues to have wheezing on physical exam.    2. Cancer: Likely metastatic breast cancer (primary breast), history of right lower lung carcinoid status post lobectomy  Now with lytic bone lesions (C6) on Xray and CT chest with suggestions of lymphangitic spread of malignancy and left lobe liver lesion 3.4 x 2.4 cm which could represent metastasis but no definitive way of knowing without a biopsy.  She is followed by Dr. [name] who will see her 06/29/12 10:15.  His previous note below:  The patient has breast cancer which appears to have metastasized to her bone and liver, possibly also to the Right lung via lymphangitic spread. However we do not have pathologic proof of metastatic disease. If the patient had established metastatic disease, as opposed to locally advanced breast cancer, that would mean she has incurable breast cancer. That does not mean it is not treatable. Her cancer is very treatable and she may get a good response from a combination of antiestrogens and anti-HER-2 agents, or chemo and anti-HER-2 agents.  Treatment of the patient's breast cancer would not be palliative, because it would not be aimed at symptom control. In fact I told the patient that with or without treatment for breast cancer her quality of life is unlikely to improve--she is likely to remain largely bed bound,  subject to repeated respiratory crises, probably needing reintubation again at some point, etc. Breast cancer treatment for Melissa Villa then would be to prolong her life. She has been very consistent with me that she does want treatment and that she would want intubation again if it came to that,. Accordingly I do not feel [facility] [place] is hospice elegible at this point. I suspect she will need SNF at discharge.    Palliative care saw the patient his admission on 06/20/12 and her code status was revised from partial code (intubation, no chest compressions) to DNR/DNI.  They started Melissa Contin 15 mg q12 with plans to discharge on with morphine IR q6 and Senna 2 tablets qhs for bowel regimen.  She will be followed by Palliative Care at [facility]    She was previously on Herceptin for  breast cancer but it was discontinued out of concern for CHF. Now she is on Fulvestrant   3. Right lower extremity Deep venous thrombosis  Diagnosed 2/7.  We had her on a heparin drip this admission until 3/4 and then we transitioned her back to Lovenox 120 mg bid.  She was initially stopped on Lovenox when she had acute renal failure this admission.     4. AKI (acute kidney injury) Creatinine trended up to 2.30.  ARB was held this admission.  Creatinine overall improved yet waxing and waning this admission.  Renal ultrasound 2/22 negative for hydronephrosis.    5. DIABETES MELLITUS, TYPE II Sliding scale insulin was given this admission with Lantus 20 units qhs, Metformin 850 mg bid.  Lantus can be titrated up in the future if needed,   6.  HYPERTENSION history  Beta blocker was initially held in acute respiratory distress and we had her on Hydralazine 25 mg q8, Lasix 40 bid decreased to 20 daily, Spironolactone 50 mg qd this admission.  We will resume Norvasc at a lower dose, resume her Coreg, and hold her ARB in recent acute kidney injury.  PCP may need to adjust blood pressure medications in the  future.    7. chronic diastolic heart failure  Continue Lasix 20 mg qd.    8. MRSA positive nares  She was treated this admission for MRSA and on contact precautions.     9. OBSTRUCTIVE SLEEP APNEA on cpap On cpap at home though patient states bipap helps breathing. Outpatient she may need her settings adjusted  10. Chronic, COPD On home 2-3 L of oxygen.   11. Normocytic anemia  Baseline hemoglobin 7-9.    12. Physical deconditioning She will need PT/OT at a skilled nursing facility short term before discharge home.  She lives alone though family is supportive.   13. Acute encephalopathy, resolved  CT this admission was negative.  Encephalopathy may have been secondary to acute hypoxic respiratory failure.    14. Falls at home   Code status on discharge: DNR/DNI  Discharge Vitals:  BP 143/66  Pulse 106  Temp(Src) 98.4 F (36.9 C) (Axillary)  Resp 20  Ht 5\' 4"  (1.626 m)  Wt 274 lb 14.6 oz (124.7 kg)  BMI 47.17 kg/m2  SpO2 97%  Physical exam:  General: Lying in bed, nad, alert and oriented x 3  HEENT: [place]/at  CV: tachycardic, no murmurs  Lungs: wheezes b/l  Abdomen: obese, soft, ntnd, normal bs  Extremities: warm no cyanosis or edema  Neuro: moving all 4 extremities, alert and oriented x 3  Skin: right breast with hyperkeratotic lesion ?previous surgery or radiation; pruritic skin tags to neck and right axilla  Discharge Labs:   Results for ALLIX, BLOMQUIST (MRN [id number]) as of 06/21/2012 14:05  Ref. Range 06/15/2012 04:45 06/16/2012 05:00 06/17/2012 05:00 06/17/2012 07:20 06/18/2012 05:05 06/19/2012 02:30 06/20/2012 06:30 06/21/2012 05:00  Sodium Latest Range: 136-145 mEq/L 138 139  137 137 136 137 138  Potassium Latest Range: 3.5-5.1 mEq/L 4.6 4.6  3.8 3.5 3.7 3.6 3.6  Chloride Latest Range: 96-112 mEq/L 102 100  99 99 98 98 99  CO2 Latest Range: 19-32 mEq/L 31 34 (H)  34 (H) 34 (H) 32 31 33 (H)  BUN Latest Range: 7.0-26.0 mg/dL 18 16  15 16 15 14 16   Creatinine Latest Range:  0.50-1.10 mg/dL 4.09 8.11  9.14 (H) 7.82 (H) 1.27 (H) 1.09 1.11 (H)  Calcium Latest Range: 8.4-10.5 mg/dL 9.5 9.4  9.5 9.2 9.3 10.0 9.4  GFR calc non Af Amer Latest Range: >90 mL/min 52 (L) 52 (L)  49 (L) 44 (L) 44 (L) 52 (L) 51 (L)  GFR calc Af Amer Latest Range: >90 mL/min 60 (L) 61 (L)  57 (L) 51 (L) 51 (L) 61 (L) 59 (L)  Glucose Latest Range: 70-99 mg/dl 454 (H) 098 (H)  119 (H) 134 (H) 124 (H) 156 (H) 192 (H)  Phosphorus Latest Range: 2.3-4.6 mg/dL 3.7  4.3       Magnesium Latest Range: 1.5-2.5 mg/dL 1.5 1.3 (L) 1.9  1.6      Results for TRANIYA, PRICHETT (MRN [id number]) as of 06/21/2012 14:05  Ref. Range 06/12/2012 04:30 06/13/2012 04:00 06/14/2012 03:00 06/15/2012 04:45 06/16/2012 05:00 06/17/2012 05:00 06/18/2012 05:05 06/19/2012 02:30 06/20/2012 06:30  WBC Latest Range: 4.0-10.5 K/uL 7.2 8.1 8.5 8.8 9.2 8.4 8.1 8.7 8.1  RBC Latest Range: 3.87-5.11 MIL/uL 3.81 (L) 3.66 (L) 3.85 (L) 3.90 4.14 4.12 4.01 4.00 4.30  Hemoglobin Latest Range: 12.0-15.0 g/dL 7.5 (L) 7.2 (L) 7.6 (L) 7.7 (L) 8.1 (L) 8.1 (L) 8.0 (L) 7.9 (L) 8.5 (L)  HCT Latest Range: 36.0-46.0 % 26.8 (L) 26.1 (L) 27.3 (L) 27.7 (L) 29.3 (L) 29.1 (L) 28.5 (L) 28.6 (L) 30.3 (L)  MCV Latest Range: 78.0-100.0 fL 70.3 (L) 71.3 (L) 70.9 (L) 71.0 (L) 70.8 (L) 70.6 (L) 71.1 (L) 71.5 (L) 70.5 (L)  MCH Latest Range: 26.0-34.0 pg 19.7 (L) 19.7 (L) 19.7 (L) 19.7 (L) 19.6 (L) 19.7 (L) 20.0 (L) 19.8 (L) 19.8 (L)  MCHC Latest Range: 30.0-36.0 g/dL 13.0 (L) 86.5 (L) 78.4 (L) 27.8 (L) 27.6 (L) 27.8 (L) 28.1 (L) 27.6 (L) 28.1 (L)  RDW Latest Range: 11.5-15.5 % 24.7 (H) 24.9 (H) 24.7 (H) 24.7 (H) 24.2 (H) 24.0 (H) 24.1 (H) 23.9 (H) 23.9 (H)  Platelets Latest Range: 150-400 K/uL 328 306 301 254 265 235 214 198 211  Neutrophils Relative Latest Range: 43-77 %  62 64 71 68 69 67 69 70  Lymphocytes Relative Latest Range: 12-46 %  23 23 19 20 19 23 19 18   Monocytes Relative Latest Range: 3-12 %  11 10 7 10 9 8 9 11   Eosinophils Relative Latest Range: 0-5 %  4 3 3 2 2 2 2  2   Basophils Relative Latest Range: 0-1 %  0 0 0 0 1 0 1 0  NEUT# Latest Range: 1.7-7.7 K/uL  5.0 5.3 6.2 6.3 5.7 5.4 5.9 5.6  Lymphocytes Absolute Latest Range: 0.7-4.0 K/uL  1.9 2.0 1.7 1.8 1.6 1.9 1.7 1.4  Monocytes Absolute Latest Range: 0.1-1.0 K/uL  0.9 0.9 0.6 0.9 0.8 0.6 0.8 0.9  Eosinophils Absolute Latest Range: 0.0-0.5 10e3/uL  0.3 0.3 0.3 0.2 0.2 0.2 0.2 0.1  Basophils Absolute Latest Range: 0.0-0.1 K/uL  0.0 0.0 0.0 0.0 0.1 0.0 0.1 0.0  RBC Morphology No range found  POLYCHROMASIA PRESENT POLYCHROMASIA PRESENT POLYCHROMASIA PRESENT POLYCHROMASIA PRESENT MIXED RBC POPULATION POLYCHROMASIA PRESENT POLYCHROMASIA PRESENT   Smear Review No range found  LARGE PLATELETS PRESENT  LARGE PLATELETS PRESENT LARGE PLATELETS PRESENT LARGE PLATELETS PRESENT  LARGE PLATELETS PRESENT    Results for MILISSA, FESPERMAN (MRN [id number]) as of 06/21/2012 14:05  Ref. Range 06/09/2012 13:18  Color, Urine Latest Range: YELLOW  AMBER (A)  APPearance Latest Range: CLEAR  CLEAR  Specific Gravity, Urine Latest Range: 1.005-1.030  1.030  pH Latest Range: 5.0-8.0  5.0  Glucose Latest Range: NEGATIVE mg/dL NEGATIVE  Bilirubin Urine Latest Range:  NEGATIVE  SMALL (A)  Ketones, ur Latest Range: NEGATIVE mg/dL 15 (A)  Protein Latest Range: NEGATIVE mg/dL 161 (A)  Urobilinogen, UA Latest Range: 0.0-1.0 mg/dL 1.0  Nitrite Latest Range: NEGATIVE  POSITIVE (A)  Leukocytes, UA Latest Range: NEGATIVE  SMALL (A)  Hgb urine dipstick Latest Range: NEGATIVE  NEGATIVE  Urine-Other No range found AMORPHOUS URATES/PHOSPHATES  WBC, UA Latest Range: <3 WBC/hpf 7-10  RBC / HPF Latest Range: <3 RBC/hpf 0-2  Squamous Epithelial / LPF Latest Range: RARE  MANY (A)  Bacteria, UA Latest Range: RARE  MANY (A)  Casts Latest Range: NEGATIVE  HYALINE CASTS (A)   Results for ARLEIGH, DICOLA (MRN [id number]) as of 06/21/2012 14:05  Ref. Range 05/30/2012 14:44 06/11/2012 06:24  C difficile by pcr Latest Range: NEGATIVE  POSITIVE (A) NEGATIVE    Results for LATOY, LABRIOLA (MRN [id number]) as of 06/21/2012 14:05  Ref. Range 06/09/2012 13:18 06/09/2012 13:25  Alcohol, Ethyl (B) Latest Range: 0-11 mg/dL  <95  AMPHETAMINES Latest Range: NONE DETECTED  NONE DETECTED   Barbiturates Latest Range: NONE DETECTED  NONE DETECTED   Benzodiazepines Latest Range: NONE DETECTED  NONE DETECTED   Opiates Latest Range: NONE DETECTED  NONE DETECTED   COCAINE Latest Range: NONE DETECTED  NONE DETECTED   Tetrahydrocannabinol Latest Range: NONE DETECTED  NONE DETECTED    Results for ILEANE, SANDO (MRN [id number]) as of 06/21/2012 14:05  Ref. Range 06/09/2012 21:20  MRSA by PCR Latest Range: NEGATIVE  POSITIVE (A)   06/09/12 blood culture negative to day  06/10/11 urine culture no growth  No results found for this or any previous visit (from the past 24 hour(s)).  Signed: Annett Gula 06/22/2012, 4:12 PM   Time Spent on Discharge: 90 minutes  Services Ordered on Discharge: PT/OT Equipment Ordered on Discharge: none

## 2012-06-21 NOTE — Progress Notes (Signed)
Physical Therapy Treatment Patient Details Name: Melissa Villa MRN: [id number] DOB: Jan 03, 1946 Today's Date: 06/21/2012 Time: 1478-2956 PT Time Calculation (min): 29 min  PT Assessment / Plan / Recommendation Comments on Treatment Session  Pt is unable to ambulate at this point and is experiencing profound weakness of the LE's and UE's. She is very motivated and worked hard on pregait activities and exercises today. She agrees that though she would like to go straight home, SNF is going to be necessary for her to gain some strength back. PT will continue to follow.    Follow Up Recommendations  SNF     Does the patient have the potential to tolerate intense rehabilitation     Barriers to Discharge        Equipment Recommendations  None recommended by PT    Recommendations for Other Services    Frequency Min 3X/week   Plan Discharge plan remains appropriate;Frequency remains appropriate    Precautions / Restrictions Precautions Precautions: Fall Restrictions Weight Bearing Restrictions: No   Pertinent Vitals/Pain No c/o pain    Mobility  Bed Mobility Bed Mobility: Not assessed Transfers Transfers: Sit to Stand;Stand to Sit Sit to Stand: 1: +2 Total assist;From chair/3-in-1;With upper extremity assist Sit to Stand: Patient Percentage: 60% Stand to Sit: To chair/3-in-1;1: +2 Total assist Stand to Sit: Patient Percentage: 60% Details for Transfer Assistance: pt had performed SPT with NT but was fatigued from sitting up. Worked on transfer skills with various props. Performed partial stand +2 tot A and no AD, with RW in front of her, with straight back chair facing bkwds and her arms at shoulder height, as well as with chair facing fwds with hands on armrests. This helped pt to wt-shift fwd because she is having difficulty bringing her chest over her knees. Also practiced partial chair push-up for repositioning because pt is having difficulty even repositioning herself in the bed  and the chair. Pt achieved full standing only one time though 6 attempts. Let nsg know that they would likely need to use the left for back to bed. Ambulation/Gait Ambulation/Gait Assistance: Not tested (comment) Stairs: No Wheelchair Mobility Wheelchair Mobility: No    Exercises General Exercises - Lower Extremity Ankle Circles/Pumps: AROM;Both;10 reps;Seated Long Arc Quad: AROM;Both;10 reps;Seated;Other (comment) (resistance given first 4 reps) Hip ABduction/ADduction: AROM;Both;10 reps;Seated Hip Flexion/Marching: AROM;Both;10 reps;Seated   PT Diagnosis:    PT Problem List:   PT Treatment Interventions:     PT Goals Acute Rehab PT Goals PT Goal Formulation: With patient Time For Goal Achievement: 06/30/12 Potential to Achieve Goals: Fair Pt will go Supine/Side to Sit: with HOB 0 degrees;with mod assist PT Goal: Supine/Side to Sit - Progress: Progressing toward goal Pt will go Sit to Stand: with mod assist;with upper extremity assist PT Goal: Sit to Stand - Progress: Progressing toward goal Pt will Transfer Bed to Chair/Chair to Bed: with mod assist PT Transfer Goal: Bed to Chair/Chair to Bed - Progress: Progressing toward goal Pt will Ambulate: 1 - 15 feet;with mod assist;with rolling walker;with least restrictive assistive device PT Goal: Ambulate - Progress: Not progressing PT Goal: Up/Down Stairs - Progress: Discontinued (comment) (SNF) Pt will Perform Home Exercise Program: with supervision, verbal cues required/provided PT Goal: Perform Home Exercise Program - Progress: Progressing toward goal  Visit Information  Last PT Received On: 06/21/12 Assistance Needed: +2    Subjective Data  Subjective: the nurse tech helped me get out of the bed to the chair Patient Stated Goal: Get stronger  Cognition  Cognition Overall Cognitive Status: Appears within functional limits for tasks assessed/performed Arousal/Alertness: Awake/alert Orientation Level: Oriented X4 /  Intact Behavior During Session: [facility] for tasks performed    Balance  Balance Balance Assessed: No  End of Session PT - End of Session Equipment Utilized During Treatment: Gait belt;Oxygen Activity Tolerance: Patient limited by fatigue Patient left: in chair;with call bell/phone within reach Nurse Communication: Mobility status   GP   Lyanne Co, PT  Acute Rehab Services  [phone]   Lyanne Co 06/21/2012, 1:28 PM

## 2012-06-22 NOTE — Clinical Social Work Note (Signed)
CSW was consulted to complete discharge of patient. Pt to transfer to [facility]: [place] today via [facility]. Family and facility are aware of d/c. D/C packet complete with chart copy, signed FL2, and signed hard Rx.  CSW signing off as no other CSW needs identified at this time.  Lia Foyer, LCSWA [facility] Clinical Social Worker Contact #: [phone]

## 2012-06-23 NOTE — Discharge Summary (Signed)
Agree with addendum.   Thanks, Aletta Edouard MD MPH 06/23/2012 4:24 PM

## 2012-06-24 ENCOUNTER — Encounter (HOSPITAL_COMMUNITY): Payer: Self-pay | Admitting: Emergency Medicine

## 2012-06-24 ENCOUNTER — Emergency Department (HOSPITAL_COMMUNITY): Payer: Medicare Other

## 2012-06-24 ENCOUNTER — Emergency Department (HOSPITAL_COMMUNITY)
Admission: EM | Admit: 2012-06-24 | Discharge: 2012-06-24 | Disposition: A | Discharge status: Rehabilitation Facility | Payer: Medicare Other | Attending: Emergency Medicine | Admitting: Emergency Medicine

## 2012-06-24 DIAGNOSIS — S82409A Unspecified fracture of shaft of unspecified fibula, initial encounter for closed fracture: Secondary | ICD-10-CM | POA: Insufficient documentation

## 2012-06-24 DIAGNOSIS — Z950 Presence of cardiac pacemaker: Secondary | ICD-10-CM | POA: Insufficient documentation

## 2012-06-24 DIAGNOSIS — Z8673 Personal history of transient ischemic attack (TIA), and cerebral infarction without residual deficits: Secondary | ICD-10-CM | POA: Insufficient documentation

## 2012-06-24 DIAGNOSIS — Z872 Personal history of diseases of the skin and subcutaneous tissue: Secondary | ICD-10-CM | POA: Insufficient documentation

## 2012-06-24 DIAGNOSIS — E1139 Type 2 diabetes mellitus with other diabetic ophthalmic complication: Secondary | ICD-10-CM | POA: Insufficient documentation

## 2012-06-24 DIAGNOSIS — R269 Unspecified abnormalities of gait and mobility: Secondary | ICD-10-CM | POA: Insufficient documentation

## 2012-06-24 DIAGNOSIS — S82402A Unspecified fracture of shaft of left fibula, initial encounter for closed fracture: Secondary | ICD-10-CM

## 2012-06-24 DIAGNOSIS — Z862 Personal history of diseases of the blood and blood-forming organs and certain disorders involving the immune mechanism: Secondary | ICD-10-CM | POA: Insufficient documentation

## 2012-06-24 DIAGNOSIS — J4489 Other specified chronic obstructive pulmonary disease: Secondary | ICD-10-CM | POA: Insufficient documentation

## 2012-06-24 DIAGNOSIS — Z8739 Personal history of other diseases of the musculoskeletal system and connective tissue: Secondary | ICD-10-CM | POA: Insufficient documentation

## 2012-06-24 DIAGNOSIS — Z79899 Other long term (current) drug therapy: Secondary | ICD-10-CM | POA: Insufficient documentation

## 2012-06-24 DIAGNOSIS — Z9981 Dependence on supplemental oxygen: Secondary | ICD-10-CM | POA: Insufficient documentation

## 2012-06-24 DIAGNOSIS — Z860100 Personal history of colon polyps, unspecified: Secondary | ICD-10-CM | POA: Insufficient documentation

## 2012-06-24 DIAGNOSIS — X58XXXA Exposure to other specified factors, initial encounter: Secondary | ICD-10-CM | POA: Insufficient documentation

## 2012-06-24 DIAGNOSIS — J449 Chronic obstructive pulmonary disease, unspecified: Secondary | ICD-10-CM | POA: Insufficient documentation

## 2012-06-24 DIAGNOSIS — I509 Heart failure, unspecified: Secondary | ICD-10-CM | POA: Insufficient documentation

## 2012-06-24 DIAGNOSIS — Y929 Unspecified place or not applicable: Secondary | ICD-10-CM | POA: Insufficient documentation

## 2012-06-24 DIAGNOSIS — Z794 Long term (current) use of insulin: Secondary | ICD-10-CM | POA: Insufficient documentation

## 2012-06-24 DIAGNOSIS — I10 Essential (primary) hypertension: Secondary | ICD-10-CM | POA: Insufficient documentation

## 2012-06-24 DIAGNOSIS — G4733 Obstructive sleep apnea (adult) (pediatric): Secondary | ICD-10-CM | POA: Insufficient documentation

## 2012-06-24 DIAGNOSIS — Y939 Activity, unspecified: Secondary | ICD-10-CM | POA: Insufficient documentation

## 2012-06-24 DIAGNOSIS — Z87891 Personal history of nicotine dependence: Secondary | ICD-10-CM | POA: Insufficient documentation

## 2012-06-24 DIAGNOSIS — Z853 Personal history of malignant neoplasm of breast: Secondary | ICD-10-CM | POA: Insufficient documentation

## 2012-06-24 DIAGNOSIS — E11359 Type 2 diabetes mellitus with proliferative diabetic retinopathy without macular edema: Secondary | ICD-10-CM | POA: Insufficient documentation

## 2012-06-24 DIAGNOSIS — Z8679 Personal history of other diseases of the circulatory system: Secondary | ICD-10-CM | POA: Insufficient documentation

## 2012-06-24 LAB — GLUCOSE, CAPILLARY: Glucose-Capillary: 212 mg/dL — ABNORMAL HIGH (ref 70–99)

## 2012-06-24 MED ORDER — ENOXAPARIN SODIUM 120 MG/0.8ML ~~LOC~~ SOLN
120.0000 mg | Freq: Two times a day (BID) | SUBCUTANEOUS | Status: DC
  Administered 2012-06-24: 120 mg via SUBCUTANEOUS
  Filled 2012-06-24 (×3): qty 0.8

## 2012-06-24 MED ORDER — MORPHINE SULFATE 10 MG/5ML PO SOLN
10.0000 mg | Freq: Four times a day (QID) | ORAL | Status: AC | PRN
Start: 2012-06-24 — End: ?

## 2012-06-24 MED ORDER — MORPHINE SULFATE 4 MG/ML IJ SOLN
6.0000 mg | INTRAMUSCULAR | Status: DC | PRN
  Administered 2012-06-24 (×2): 6 mg via INTRAMUSCULAR
  Filled 2012-06-24 (×3): qty 1

## 2012-06-24 MED ORDER — ONDANSETRON 4 MG PO TBDP
4.0000 mg | ORAL_TABLET | Freq: Once | ORAL | Status: AC
  Administered 2012-06-24: 4 mg via ORAL
  Filled 2012-06-24: qty 1

## 2012-06-24 MED ORDER — HYDROMORPHONE HCL PF 1 MG/ML IJ SOLN
1.0000 mg | Freq: Once | INTRAMUSCULAR | Status: AC
  Administered 2012-06-24: 1 mg via INTRAVENOUS
  Filled 2012-06-24: qty 1

## 2012-06-24 MED ORDER — CARVEDILOL 25 MG PO TABS
25.0000 mg | ORAL_TABLET | Freq: Two times a day (BID) | ORAL | Status: DC
  Administered 2012-06-24: 25 mg via ORAL
  Filled 2012-06-24 (×3): qty 1

## 2012-06-24 MED ORDER — IBUPROFEN 800 MG PO TABS
800.0000 mg | ORAL_TABLET | Freq: Once | ORAL | Status: AC
  Administered 2012-06-24: 800 mg via ORAL
  Filled 2012-06-24: qty 1

## 2012-06-24 MED ORDER — SODIUM CHLORIDE 0.9 % IJ SOLN: 10.0000 mL | INTRAMUSCULAR | Status: DC | PRN

## 2012-06-24 MED ORDER — MORPHINE SULFATE 4 MG/ML IJ SOLN: 6.0000 mg | Freq: Once | INTRAMUSCULAR | Status: AC

## 2012-06-24 MED ORDER — MORPHINE SULFATE 4 MG/ML IJ SOLN
4.0000 mg | INTRAMUSCULAR | Status: DC | PRN
  Filled 2012-06-24: qty 1

## 2012-06-24 MED ORDER — HEPARIN SOD (PORK) LOCK FLUSH 100 UNIT/ML IV SOLN
INTRAVENOUS | Status: AC
  Filled 2012-06-24: qty 5

## 2012-06-24 NOTE — ED Notes (Signed)
Pt received in TCU rm 28 from main ED. Alert and oriented; family at bedside. Pt given a lunch tray. Pt's family assisting pt with lunch. Pt is being fed by family member. Denies having any needs at this time. Awaiting trasport to [facility]. Will cont to care for pt until transfer. In the main time, family remains at bedside. Vwilliams,rn.

## 2012-06-24 NOTE — ED Notes (Signed)
Bed:WA07<BR> Expected date:<BR> Expected time:<BR> Means of arrival:<BR> Comments:<BR> EMS

## 2012-06-24 NOTE — ED Provider Notes (Signed)
History     CSN: [id number]  Arrival date & time 06/24/12  0558   First MD Initiated Contact with Patient 06/24/12 [phone]      Chief Complaint  Patient presents with  . left leg pain     (Consider location/radiation/quality/duration/timing/severity/associated sxs/prior treatment) HPI  Melissa Villa is a 67 y.o. female with past medical history significant for CHF, COPD, insulin-dependent diabetes and obesity complaining of left leg pain status post fall locally to the restroom 3 days ago on Thursday. She is a resident of [facility] therefore rehabilitation she is non-ambulatory at her baseline. X-ray was obtained yesterday and results reported this a.m. She was sent to the ED for management of a reported fibula fracture x-ray is not available. Patient reports her pain is 10 out of 10 it is located in the posterior inferior left lower extremity she denies any numbness or paresthesia.Patient denies any head trauma, LOC, nausea vomiting, change in vision, dysarthria, chest pain, shortness of breath above her baseline, palpitations, nausea vomiting, change in bowel or bladder habits. She is on 2 L of home O2 increasing to 3 L when she is active.    Past Medical History  Diagnosis Date  . CHF (congestive heart failure)      2-D echo 02/19/2009 showed left ventricle cavity size normal; systolic function normal, estimated ejection fraction 55%; wall motion normal; no regional wall motion abnormalities; PA peak pressure 47mm Hg.  [name] COPD (chronic obstructive pulmonary disease)     Pulmonary function tests on 08/05/2010 showed mixed obstructive and restrictive lung disease with no significant response to bronchodilator, and decreased diffusion capacity that corrects to normal range when adjusted for the inhaled alveolar volume.  . Degenerative joint disease   . AV block, complete     S/P dual-chamber for symptomatic episodes of bradycardia and complete heart block.  . Pacemaker     S/P  dual-chamber for symptomatic episodes of bradycardia and complete heart block.  . Adenomatous polyp of colon     Tubular adenomas X 2 found 06/1999; negative screening colonoscopy 02/13/2004 by Dr. [name].  . Hypercholesteremia   . Obstructive sleep apnea     Baseline diagnostic nocturnal polysomnogram on July 27, 2005 showed severe obstructive sleep apnea/hypopnea syndrome, with AHI 153.2 per hour.  Last nocturnal polysomnogram done for CPAP titration was 05/01/2009; CPAP was titrated to 13 CWP, AHI 1.8 per hour using a large Respironics FitLife Full Face Mask with heated humidifier.  . Peripheral autonomic neuropathy due to diabetes mellitus   . 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 [name]  . Constipation   . Hand pain   . Palpitation   . Rib pain     right sided   . DM type 2 (diabetes mellitus, type 2)   . Skin rash   . Urinary incontinence   . Proliferative diabetic retinopathy(362.02)     S/P panretinal photocoagulation by Dr. [name]  . Vitreous hemorrhage     With tractional detachment, left eye; S/P posterior vitrectomy with membrane peel by Dr. [name] 04/06/2005  . Dyspnea on exertion     Cardiac cath 06/17/2005 by Dr. [name] showed LVEF 50-55%, 30% proximal LAD stenosis, small diagonals.   . Shingles rash 03/25/11  . Breast cancer     S/P left lumpectomy and axillary node dissection 03/1991 for a T1 N0 medullary breast cancer, no lymph node involvement, treated with tamoxifen for 2  years; S/P right lumpectomy and axillary lymph node dissection August 1995 for a T1 N0 microinvasive breast cancer, treated with Arimidex for 5 years.  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 Followed by Dr. [name].  . Breast cancer, stage 3 102/13    right hx lumpectomy 1994, bx today=invasive ductal ca  . Allergy     thinitis  . Asthma   . Bronchitis     hx  . Cataract     b/l surgery  . DM  hyperosmolarity type II   . Hypertension   . TIA (transient ischemic attack)   . Anemia   . DJD (degenerative joint disease)   . DM neuropathies   . Renal insufficiency     Past Surgical History  Procedure Laterality Date  . Mastectomy partial / lumpectomy w/ axillary lymphadenectomy  09/03/1990, 1993-09-02    S/P left lumpectomy and axillary lymph node dissection December 1992 for a T1 N0 medullary breast cancer, no lymph node involvement, treated with tamoxifen for 2 years; S/P right lumpectomy and axillary lymph node dissection August 1995 for a T1 N0 microinvasive breast cancer, treated with Arimidex for 5 years.  Patient is followed by Dr. [name] at the cancer center.  . Lung lobectomy  06/13/2001    S/P right lower lobe lobectomy 06/13/2001 by Dr. [name]  . Pacemaker insertion  04/20/2001  . Mastectomy partial / lumpectomy    . Lobectomy    . Pacemaker insertion    . Cholecystectomy    . Tonsillectomy    . Port  a  cath      Family History  Problem Relation Age of Onset  . Breast cancer Mother 37    Deceased 09/03/02  . Diabetes type II Mother   . Heart attack Brother   . Cervical cancer Neg Hx   . Colon cancer Neg Hx   . Breast cancer Mother   . Diabetes Mellitus II Mother   . Hypertension Brother   . Coronary artery disease Brother   . Stroke Brother   . Diabetes type II Brother   . Hypertension Sister     History  Substance Use Topics  . Smoking status: Former Smoker -- 1.00 packs/day for 15 years    Types: Cigarettes    Quit date: 04/19/2001  . Smokeless tobacco: Never Used  . Alcohol Use: No    OB History   Grav Para Term Preterm Abortions TAB SAB Ect Mult Living                  Review of Systems  Constitutional: Negative for fever.  Respiratory: Negative for shortness of breath.   Cardiovascular: Negative for chest pain.  Gastrointestinal: Negative for nausea, vomiting, abdominal pain and diarrhea.  Musculoskeletal: Positive for arthralgias and gait  problem.  All other systems reviewed and are negative.    Allergies  Review of patient's allergies indicates no known allergies.  Home Medications   Current Outpatient Rx  Name  Route  Sig  Dispense  Refill  . acetaminophen (TYLENOL) 325 MG tablet   Oral   Take 2 tablets (650 mg total) by mouth every 6 (six) hours as needed.         [name] albuterol (PROAIR HFA) 108 (90 BASE) MCG/ACT inhaler   Inhalation   Inhale 2 puffs into the lungs 4 (four) times daily as needed for wheezing or shortness of breath.   1 Inhaler   11   .  albuterol (PROVENTIL) (2.5 MG/3ML) 0.083% nebulizer solution   Nebulization   Take 2.5 mg by nebulization every 6 (six) hours.         [name] amLODipine (NORVASC) 2.5 MG tablet   Oral   Take 1 tablet (2.5 mg total) by mouth daily.         . Bepotastine Besilate (BEPREVE) 1.5 % SOLN   Both Eyes   Place 1 drop into both eyes 2 (two) times daily.         . budesonide (PULMICORT) 0.5 MG/2ML nebulizer solution   Nebulization   Take 2 mLs (0.5 mg total) by nebulization 2 (two) times daily.   2 mL   3   . Calcium Carb-Cholecalciferol (CALCIUM + D3) 600-200 MG-UNIT TABS   Oral   Take 1 tablet by mouth daily.         . camphor-menthol (SARNA) lotion   Topical   Apply 1 application topically as needed for itching.   222 mL      . carvedilol (COREG) 12.5 MG tablet   Oral   Take 2 tablets (25 mg total) by mouth 2 (two) times daily.   120 tablet   6   . cetirizine (ZYRTEC) 10 MG tablet   Oral   Take 10 mg by mouth daily.         . citalopram (CELEXA) 10 MG tablet   Oral   Take 1 tablet (10 mg total) by mouth daily.   30 tablet   6   . Dexlansoprazole (DEXILANT) 60 MG capsule   Oral   Take 60 mg by mouth every morning. Take 15 minutes before breakfast.         . enoxaparin (LOVENOX) 120 MG/0.8ML injection   Subcutaneous   Inject 0.8 mLs (120 mg total) into the skin every 12 (twelve) hours.   60 Syringe   11   .  Fluticasone-Salmeterol (ADVAIR) 250-50 MCG/DOSE AEPB   Inhalation   Inhale 1 puff into the lungs every 12 (twelve) hours.         . furosemide (LASIX) 20 MG tablet   Oral   Take 1 tablet (20 mg total) by mouth daily.   30 tablet   11   . insulin glargine (LANTUS) 100 UNIT/ML injection   Subcutaneous   Inject 20 Units into the skin at bedtime.   10 mL      . IRON PO   Oral   Take 1 tablet by mouth daily.         [name] levalbuterol (XOPENEX HFA) 45 MCG/ACT inhaler   Inhalation   Inhale 1-2 puffs into the lungs every 6 (six) hours.   1 Inhaler      . metFORMIN (GLUCOPHAGE) 850 MG tablet   Oral   Take 1 tablet (850 mg total) by mouth 2 (two) times daily with a meal.         . mometasone (NASONEX) 50 MCG/ACT nasal spray   Nasal   Place 2 sprays into the nose daily. scheduled         . morphine (MS CONTIN) 15 MG 12 hr tablet   Oral   Take 1 tablet (15 mg total) by mouth every 12 (twelve) hours.   60 tablet   0   . morphine 10 MG/5ML solution   Oral   Take 2.5 mLs (5 mg total) by mouth every 4 (four) hours as needed.   500 mL   0   . Multiple Vitamin (MULTIVITAMIN  WITH MINERALS) TABS   Oral   Take 1 tablet by mouth daily.         . pregabalin (LYRICA) 200 MG capsule   Oral   Take 1 capsule (200 mg total) by mouth 2 (two) times daily.   60 capsule   1   . rosuvastatin (CRESTOR) 10 MG tablet   Oral   Take 10 mg by mouth daily.         [name] senna-docusate (SENOKOT-S) 8.6-50 MG per tablet   Oral   Take 2 tablets by mouth at bedtime.         [name] tiotropium (SPIRIVA) 18 MCG inhalation capsule   Inhalation   Place 1 capsule (18 mcg total) into inhaler and inhale daily.   30 capsule   11   . traMADol (ULTRAM) 50 MG tablet   Oral   Take 50 mg by mouth every 6 (six) hours as needed. For pain           BP 122/44  Pulse 84  Temp(Src) 99 F (37.2 C) (Oral)  Resp 20  SpO2 98%  Physical Exam  Nursing note and vitals reviewed. Constitutional: She  is oriented to person, place, and time. She appears well-developed and well-nourished. No distress.  HENT:  Head: Normocephalic.  Eyes: Conjunctivae and EOM are normal.  Cardiovascular: Normal rate, regular rhythm and intact distal pulses.   Pulmonary/Chest: Effort normal. No stridor.  Musculoskeletal: Normal range of motion.       Legs: No swelling, ecchymoses to left lower extremity. Patient is diffusely tender especially to the posterior distal side. Dorsalis pedis is 2+ bilaterally and distal sensation is grossly intact.  Neurological: She is alert and oriented to person, place, and time.  Psychiatric: She has a normal mood and affect.    ED Course  Procedures (including critical care time)  Labs Reviewed - No data to display Dg Tibia/fibula Left  06/24/2012  *RADIOLOGY REPORT*  Clinical Data: Post fall (06/21/2012), now with pain below the knee  LEFT TIBIA AND FIBULA - 2 VIEW  Comparison: None  Findings:  There is an oblique, slightly comminuted fracture of the proximal diaphysis of the fibula with a minimal amount of foreshortening and displacement.  No intra-articular extension.  Regional soft tissues are normal.  No radiopaque foreign body.  Limited visualization of the adjacent knee suggests medial compartment degenerative change with joint space loss, subchondral sclerosis and osteophytosis.  Limited visualization of the ankle suggests minimal enthesopathic change involving the head of the fibula.  Minimal enthesopathic change at the Achilles tendon insertion site.  Small plantar calcaneal spur.  IMPRESSION: 1.  Oblique, slightly comminuted and displaced fracture of the proximal diaphysis of the fibula.  Given the location of this fracture, further evaluation with dedicated ankle radiographs is recommended to exclude the presence of concomitant Maisonneuve fracture.  2.  Degenerative change of the medial compartment of the knee, incompletely evaluated.   Original Report Authenticated By:  Tacey Ruiz, MD    Dg Tibia/fibula Right  06/24/2012  *RADIOLOGY REPORT*  Clinical Data: Fall 4 days ago.  Bump on lateral side of tibia / fibula.  RIGHT TIBIA AND FIBULA - 2 VIEW  Comparison: None.  Findings: Degenerative irregularity about the tibial spines. No acute fracture or dislocation.  Patellofemoral osteoarthritis incidentally noted.  No soft tissue osseous mass identified.  IMPRESSION: Degenerative change, without acute osseous finding.   Original Report Authenticated By: Jeronimo Greaves, M.D.    Dg Ankle Complete Left  06/24/2012  *RADIOLOGY REPORT*  Clinical Data: Left ankle pain.  LEFT ANKLE COMPLETE - 3+ VIEW  Comparison: No priors.  Findings: Four views of the left ankle demonstrate no acute displaced fracture, subluxation, dislocation, joint or soft tissue abnormality.  IMPRESSION: 1.  No acute radiographic abnormality of the left ankle.   Original Report Authenticated By: Trudie Reed, M.D.      1. Fibula fracture, left, closed, initial encounter       MDM   Melissa Villa is a 67 y.o. female with pain to left lower extremity status post fall 3 days ago. X-ray shows a slightly comminuted and displaced pop proximal fibula fracture. Patient is nonweightbearing at her baseline. She is a resident of [facility] for rehabilitation to regain her ambulation. Patient has currently taking Lovenox. Her pain is severe and pain medication administration is delayed secondary to port access issues.  Orthopedic consult from Dr. Ave Filter appreciated: He recommends a simple Ace wrap at the location of the fracture and a boot cam walker because she has significant ankle pain. He will follow with the patient in his office next week.  Posterior splint applied rather than cam walker as patient could not tolerate the ankle it put her ankle into.  Patient's taking MSIR 15 mg every 12 hours also on a morphine suspension 5 mg every 4 hours. I will increase his morphine suspension to 10 mg every 6  hours for pain control.   Filed Vitals:   06/24/12 0559 06/24/12 0603 06/24/12 0750 06/24/12 1100  BP: 122/44  128/59 130/54  Pulse: 84  82 81  Temp: 99 F (37.2 C)     TempSrc: Oral     Resp: 20  18 18   SpO2: 93% 98% 89% 93%     Pt verbalized understanding and agrees with care plan. Outpatient follow-up and return precautions given.     Wynetta Emery, PA-C 06/24/12 1545  Nicole Pisciotta, PA-C 06/24/12 1546

## 2012-06-24 NOTE — ED Notes (Signed)
Spoke with EMS personnel; stated pt has one transfer ahead of her and then they'd be in to pick pt up. Vw, rn.

## 2012-06-24 NOTE — ED Notes (Signed)
Left chest port heparinized per protocol. Port a cath deaccessed; needle intact. Gauze and bandaid applied to site. Pt tolerated procedure. Vwilliams,rn.

## 2012-06-24 NOTE — Progress Notes (Signed)
CSW met with the Pt and family at the bedside to discuss unsatisfactory treatment at the Pt's current SNF placement.   Pt stated that she was "not listened to when she told them something was wrong with her foot and they had her do PT on it anyway". Pt's family member stated that she was not looked after and cared for properly since her admission to the facility. Pt and family want to change to a different facility and would like information about how to do that.   CSW provided family and Pt with information about the local Ombudsman in the facility's district to contact with their concerns about facility and assistance relocating to another facility.   CSW also gave the Pt the option to speak with the Case Manager to see what they may be able to do to assist with HHPT if Pt was agreeable. Pt stated that she feels she does still require care from a SNF and would go back to current facility in hopes of transitioning to another facility this week.   CSW provided contact information if further assistance would be needed while here in the ED.   No further assistance needed at this time.    Leron Croak, LCSWA [employer] [phone]

## 2012-06-24 NOTE — Progress Notes (Signed)
Pt requesting pain med. Has morphine IV ordered. Pt prefers to have ibuprofen instead. MD notified. Order given for ibuprofen. Vwilliams,rn.

## 2012-06-24 NOTE — ED Notes (Signed)
Per EMS, pt. From [facility] with complaint of left leg pain @10 /10 which started 2 days ago after she fell and fractured proximal fibular shaft as confirmed by Xray. Alert and oriented x4. On O2 per [place].

## 2012-06-25 NOTE — ED Provider Notes (Signed)
Medical screening examination/treatment/procedure(s) were performed by non-physician practitioner and as supervising physician I was immediately available for consultation/collaboration.  Douglas Delo, MD 06/25/12 1540 

## 2012-06-28 ENCOUNTER — Other Ambulatory Visit: Payer: Medicare Other | Admitting: Lab

## 2012-06-28 ENCOUNTER — Ambulatory Visit: Payer: Medicare Other | Admitting: Physician Assistant

## 2012-06-29 ENCOUNTER — Other Ambulatory Visit: Payer: Medicare Other | Admitting: Lab

## 2012-06-29 ENCOUNTER — Encounter: Payer: Self-pay | Admitting: Physician Assistant

## 2012-06-29 ENCOUNTER — Ambulatory Visit: Payer: Medicare Other | Admitting: Physician Assistant

## 2012-06-29 ENCOUNTER — Ambulatory Visit: Payer: Self-pay

## 2012-06-29 NOTE — Progress Notes (Signed)
[facility] today.  Letter mailed to patient.  Zollie Scale, PA-C 06/29/2012

## 2012-07-03 ENCOUNTER — Ambulatory Visit (INDEPENDENT_AMBULATORY_CARE_PROVIDER_SITE_OTHER): Payer: Medicare Other | Admitting: Internal Medicine

## 2012-07-03 DIAGNOSIS — E119 Type 2 diabetes mellitus without complications: Secondary | ICD-10-CM

## 2012-07-07 NOTE — Progress Notes (Signed)
Patient ID: Melissa Villa, female   DOB: 10-23-1945, 67 y.o.   MRN: [id number]  No show

## 2012-07-13 ENCOUNTER — Ambulatory Visit: Payer: Self-pay | Admitting: Internal Medicine

## 2012-07-17 ENCOUNTER — Encounter: Payer: Self-pay | Admitting: Internal Medicine

## 2012-07-18 DEATH — deceased

## 2012-07-23 ENCOUNTER — Other Ambulatory Visit: Payer: Self-pay | Admitting: Oncology

## 2012-07-23 NOTE — Consult Note (Signed)
I have reviewed and discussed the care of this patient in detail with the nurse practitioner including pertinent patient records, physical exam findings and data. I agree with details of this encounter.  

## 2012-07-27 ENCOUNTER — Ambulatory Visit: Payer: Self-pay

## 2012-08-16 ENCOUNTER — Telehealth: Payer: Self-pay | Admitting: *Deleted

## 2012-08-16 NOTE — Telephone Encounter (Signed)
Pt deceased per obits on 07-06-12/mt

## 2012-08-24 ENCOUNTER — Ambulatory Visit: Payer: Self-pay

## 2012-09-21 ENCOUNTER — Ambulatory Visit: Payer: Self-pay

## 2013-11-23 IMAGING — CT CT HEAD W/O CM
3 of 5 series · 16 of 47 positions shown, 19 images · non-contrast
Comparison: 03/28/2012

CT HEAD

CLINICAL DATA: Altered mental status

CT HEAD WITHOUT CONTRAST
CT CERVICAL SPINE WITHOUT CONTRAST
TECHNIQUE: Multidetector CT imaging of the head and cervical spine
was performed following the standard protocol without intravenous
contrast.  Multiplanar CT image reconstructions of the cervical
spine were also generated.

[Series 602: coronal · coronal · 0.43mm/px · 3 of 39 slices shown]
[im 13/39  brain]
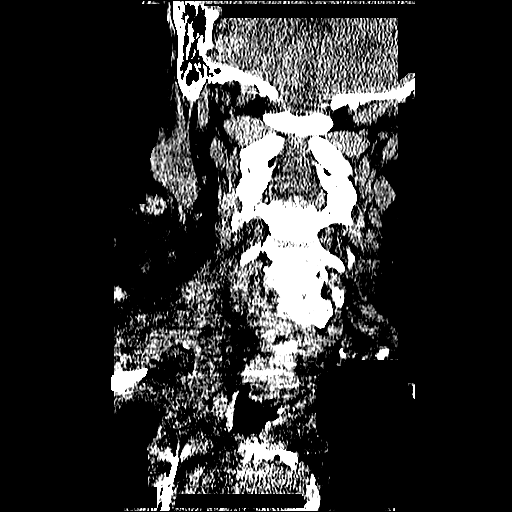
[im 17/39  brain]
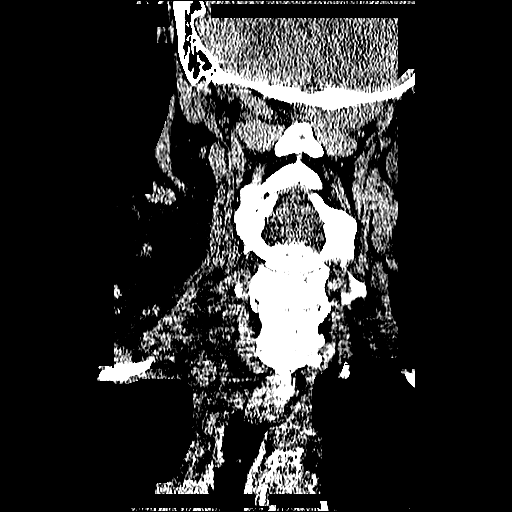
[im 22/39  brain]
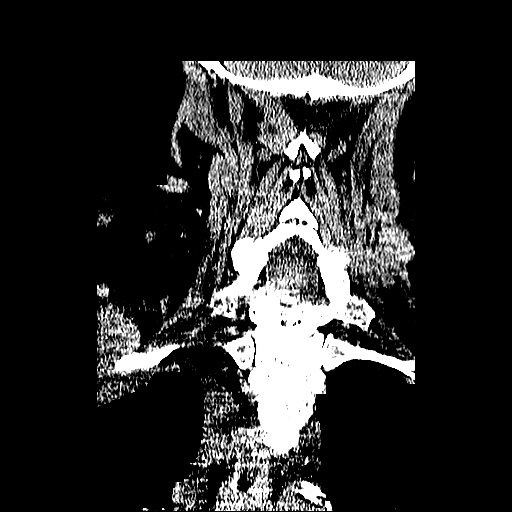

[Series 603: sagittal · sagittal · 0.43mm/px · 3 of 39 slices shown]
[im 13/39  brain]
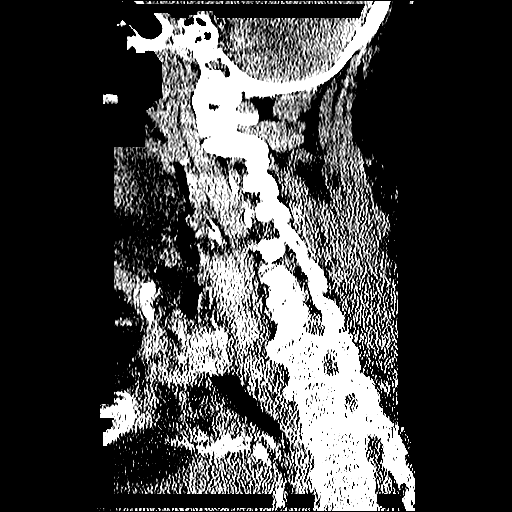
[im 20/39  brain]
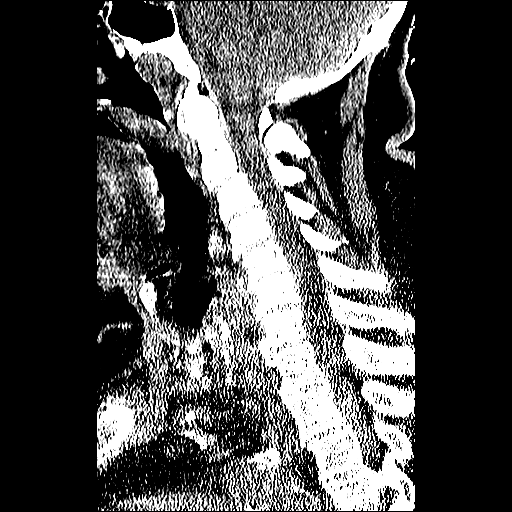
[im 26/39  brain]
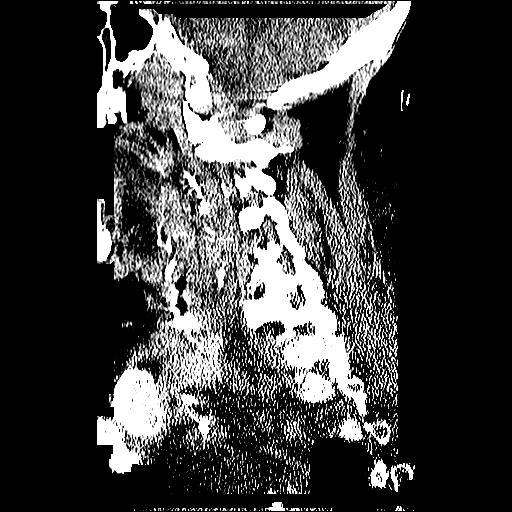

[Series 604: orthgo · axial · 0.43mm/px · z∈[+964,+1111]mm · 10 of 96 slices shown, 13 images]
[im 9/96  brain]
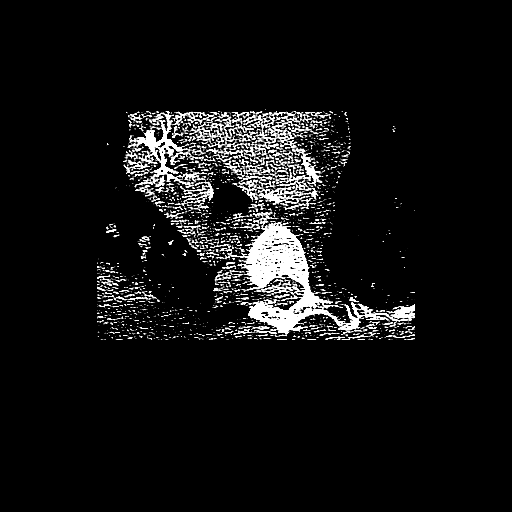
[im 9/96  bone]
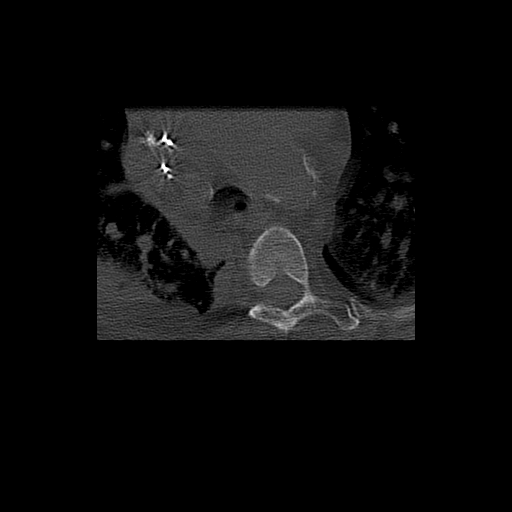
[im 18/96  brain]
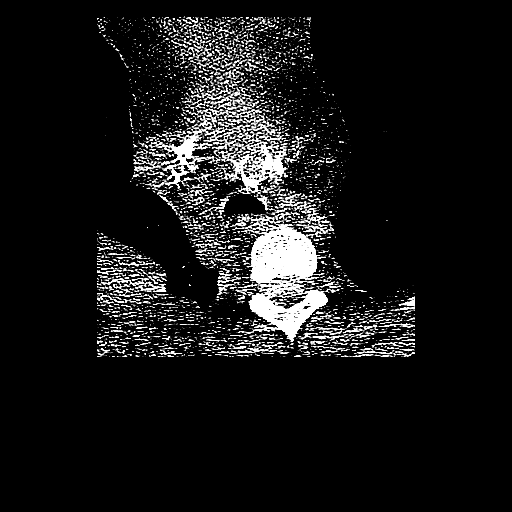
[im 26/96  brain]
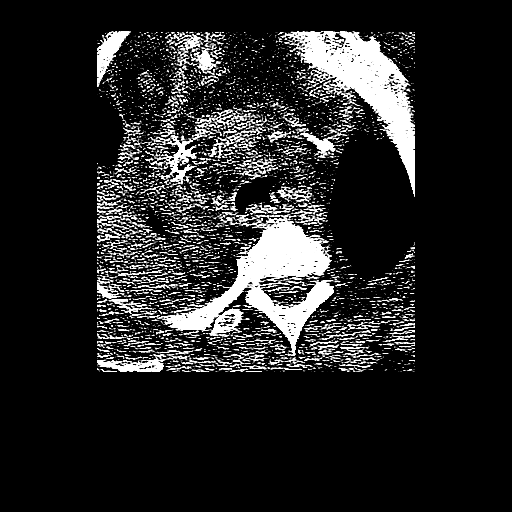
[im 35/96  brain]
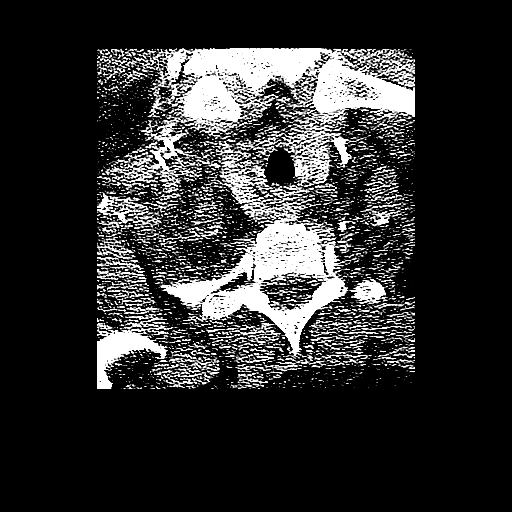
[im 44/96  brain]
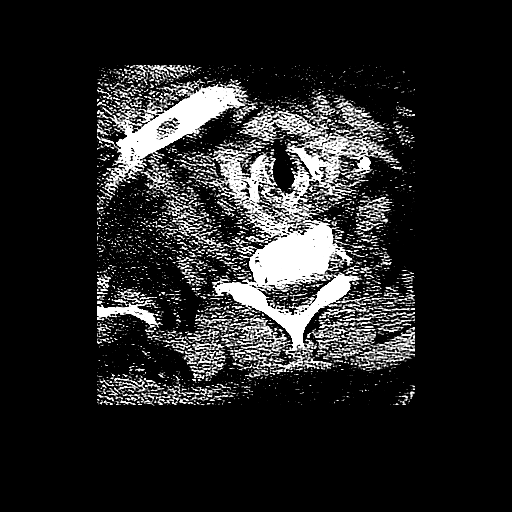
[im 44/96  bone]
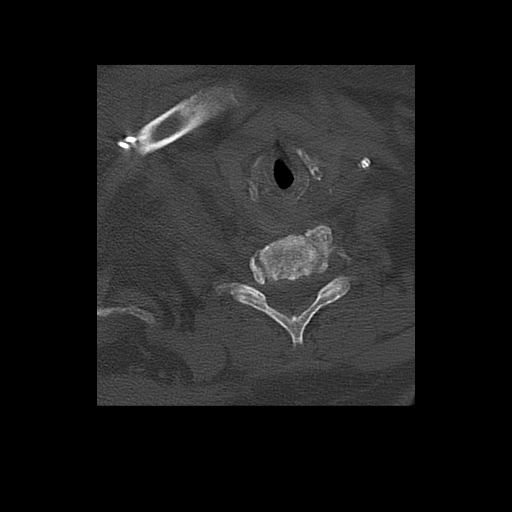
[im 52/96  brain]
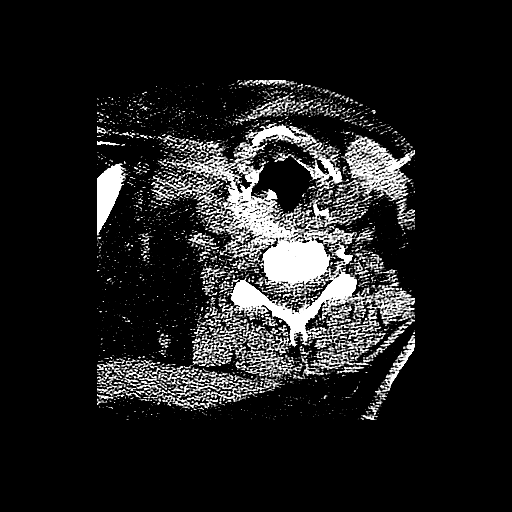
[im 61/96  brain]
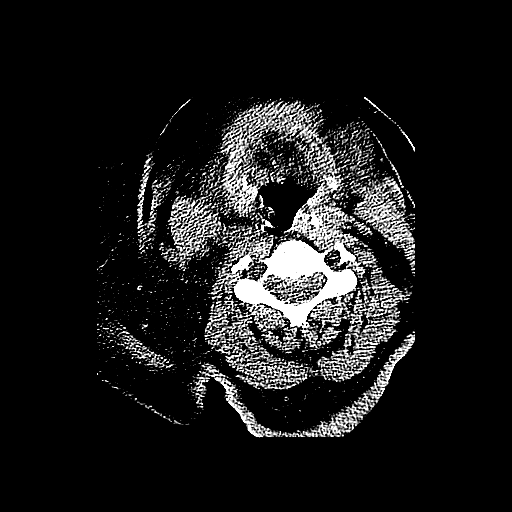
[im 70/96  brain]
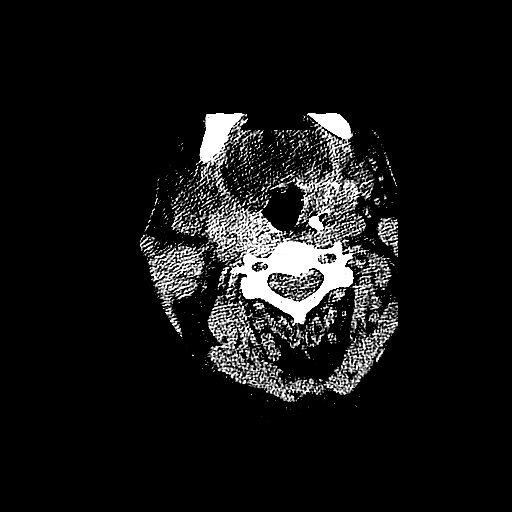
[im 78/96  brain]
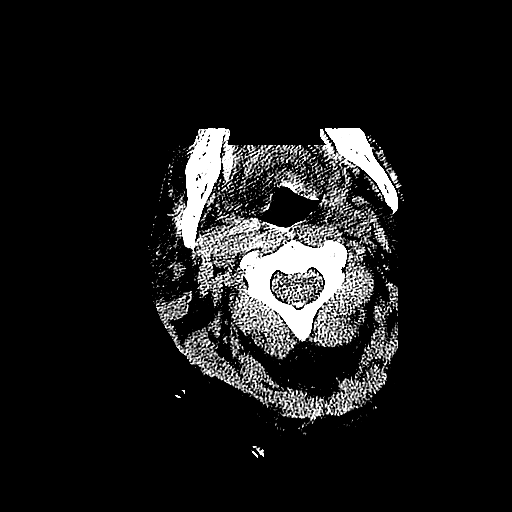
[im 78/96  bone]
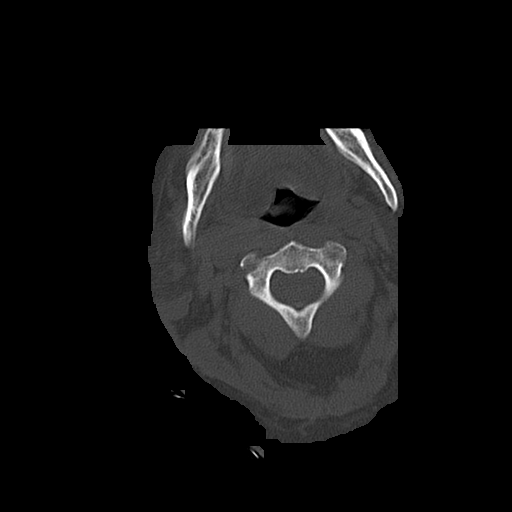
[im 87/96  brain]
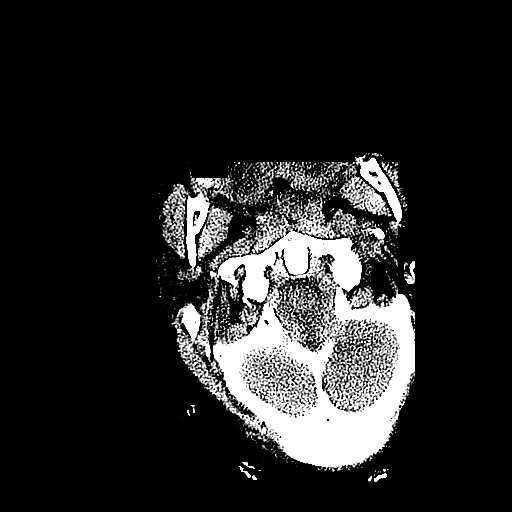

[16 of 47 positions shown; findings below may reference images not displayed]

FINDINGS: No skull fracture is noted.  Paranasal sinuses and
mastoid air cells are unremarkable.

Mild cerebral atrophy is stable.  No intracranial hemorrhage, mass
effect or midline shift.

No acute infarction.  No mass lesion is noted on this unenhanced
scan.
IMPRESSION: No acute intracranial abnormality.  Mild cerebral atrophy.

CT CERVICAL SPINE
FINDINGS: Axial images of the cervical spine shows no acute
fracture or subluxation. Computer processed images shows no acute
fracture or subluxation.  Mild degenerative changes C1-C2
articulation.  Mild anterior spurring lower endplate of the C4-C5
and C6 vertebral body.  Moderate anterior spurring lower endplate
of the C7 vertebral body.  Mild disc space flattening at C5-C6
level.  There is sclerosis  and mixed lytic appearance of the C6
vertebral body.  This is highly suspicious for metastatic disease.
Further evaluation with MRI is recommended.

Images of the lung apices shows partially loculated right pleural
effusion.  Right apical scarring is noted.  No diagnostic
pneumothorax.
IMPRESSION: 1.  No acute fracture or subluxation.  Degenerative changes as
described above.
2.  Mixed lytic sclerotic appearance of the C6 vertebral body.
This is highly suspicious for metastatic disease.  Further
evaluation with MRI is recommended.
3.  Partially visualized loculated right pleural effusion.

## 2013-11-23 IMAGING — CR DG CHEST 2V
1 series · 1 of 1 positions shown · non-contrast
Comparison: One-view chest 06/05/2012.

CLINICAL DATA: Altered mental status.

CHEST - 2 VIEW

[w chest lat]
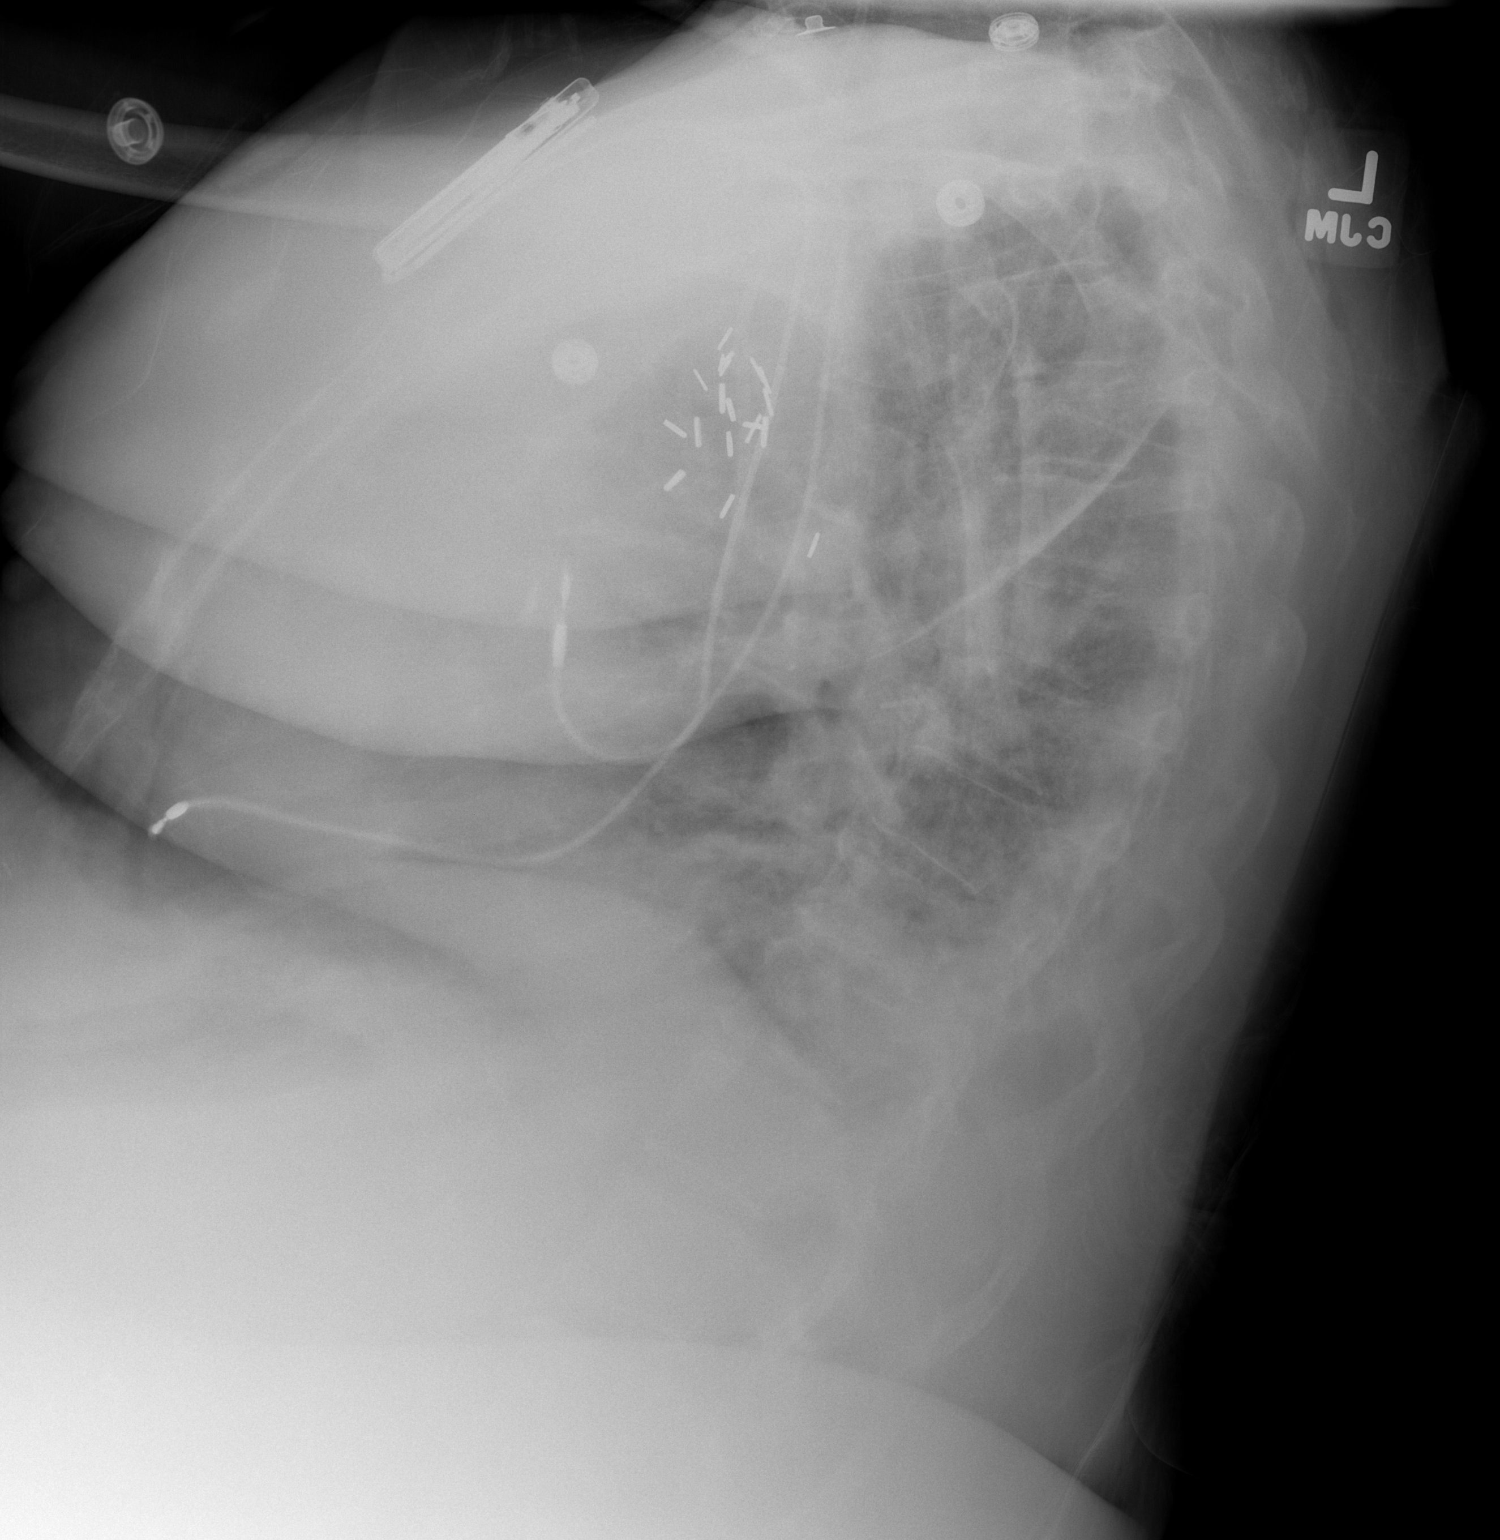

[1 of 1 positions shown; findings below may reference images not displayed]

FINDINGS: The Port-A-Cath and pacing wires are stable.  The right
pleural effusion is reaccumulating following removal of the chest
tube.  There appears to be loculated component.  Right basilar
airspace disease is present.  While this may represent atelectasis,
infection is not excluded.  Mild pulmonary vascular congestion and
cardiac enlargement are stable.
IMPRESSION: 1.  Reaccumulating right pleural effusion.  There may be a
loculated component.
2.  Worsening right lower lobe airspace disease.  Infection is not
excluded.
3.  Stable pulmonary vascular congestion.

## 2013-12-14 ENCOUNTER — Other Ambulatory Visit: Payer: Self-pay | Admitting: *Deleted
